# Patient Record
Sex: Female | Born: 1935 | Race: White | Hispanic: No | Marital: Married | Smoking: Former smoker
Health system: Southern US, Community
[De-identification: ages and names within clinical notes are randomized; demographics above are authoritative.]

## PROBLEM LIST (undated history)

## (undated) DIAGNOSIS — S2239XA Fracture of one rib, unspecified side, initial encounter for closed fracture: Secondary | ICD-10-CM

## (undated) DIAGNOSIS — I639 Cerebral infarction, unspecified: Secondary | ICD-10-CM

## (undated) DIAGNOSIS — E119 Type 2 diabetes mellitus without complications: Secondary | ICD-10-CM

## (undated) DIAGNOSIS — Z87442 Personal history of urinary calculi: Secondary | ICD-10-CM

## (undated) DIAGNOSIS — E785 Hyperlipidemia, unspecified: Secondary | ICD-10-CM

## (undated) DIAGNOSIS — E039 Hypothyroidism, unspecified: Secondary | ICD-10-CM

## (undated) DIAGNOSIS — J939 Pneumothorax, unspecified: Secondary | ICD-10-CM

## (undated) DIAGNOSIS — H269 Unspecified cataract: Secondary | ICD-10-CM

## (undated) DIAGNOSIS — I10 Essential (primary) hypertension: Secondary | ICD-10-CM

## (undated) DIAGNOSIS — R011 Cardiac murmur, unspecified: Secondary | ICD-10-CM

## (undated) DIAGNOSIS — M810 Age-related osteoporosis without current pathological fracture: Secondary | ICD-10-CM

## (undated) DIAGNOSIS — M1990 Unspecified osteoarthritis, unspecified site: Secondary | ICD-10-CM

## (undated) HISTORY — PX: TUBAL LIGATION: SHX77

## (undated) HISTORY — DX: Age-related osteoporosis without current pathological fracture: M81.0

## (undated) HISTORY — DX: Unspecified cataract: H26.9

## (undated) HISTORY — PX: FRACTURE SURGERY: SHX138

## (undated) HISTORY — DX: Cardiac murmur, unspecified: R01.1

## (undated) HISTORY — PX: JOINT REPLACEMENT: SHX530

## (undated) HISTORY — PX: TONSILLECTOMY: SUR1361

## (undated) HISTORY — PX: COSMETIC SURGERY: SHX468

## (undated) HISTORY — PX: EYE SURGERY: SHX253

## (undated) HISTORY — PX: AUGMENTATION MAMMAPLASTY: SUR837

## (undated) HISTORY — PX: DILATION AND CURETTAGE OF UTERUS: SHX78

---
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
## 2000-12-31 ENCOUNTER — Ambulatory Visit (HOSPITAL_COMMUNITY): Admission: RE | Admit: 2000-12-31 | Discharge: 2000-12-31 | Payer: Self-pay | Admitting: Pulmonary Disease

## 2001-02-05 ENCOUNTER — Other Ambulatory Visit: Admission: RE | Admit: 2001-02-05 | Discharge: 2001-02-05 | Payer: Self-pay | Admitting: Obstetrics and Gynecology

## 2001-04-20 ENCOUNTER — Other Ambulatory Visit: Admission: RE | Admit: 2001-04-20 | Discharge: 2001-04-20 | Payer: Self-pay | Admitting: General Surgery

## 2001-05-04 ENCOUNTER — Other Ambulatory Visit: Admission: RE | Admit: 2001-05-04 | Discharge: 2001-05-04 | Payer: Self-pay | Admitting: General Surgery

## 2002-03-11 ENCOUNTER — Other Ambulatory Visit: Admission: RE | Admit: 2002-03-11 | Discharge: 2002-03-11 | Payer: Self-pay | Admitting: Obstetrics and Gynecology

## 2003-11-24 ENCOUNTER — Ambulatory Visit (HOSPITAL_COMMUNITY): Admission: RE | Admit: 2003-11-24 | Discharge: 2003-11-24 | Payer: Self-pay | Admitting: Pulmonary Disease

## 2005-02-17 ENCOUNTER — Ambulatory Visit (HOSPITAL_COMMUNITY): Admission: RE | Admit: 2005-02-17 | Discharge: 2005-02-17 | Payer: Self-pay | Admitting: Ophthalmology

## 2005-02-24 ENCOUNTER — Ambulatory Visit (HOSPITAL_COMMUNITY): Admission: RE | Admit: 2005-02-24 | Discharge: 2005-02-24 | Payer: Self-pay | Admitting: Ophthalmology

## 2006-06-19 ENCOUNTER — Ambulatory Visit (HOSPITAL_COMMUNITY): Admission: RE | Admit: 2006-06-19 | Discharge: 2006-06-19 | Payer: Self-pay | Admitting: Obstetrics & Gynecology

## 2007-07-08 ENCOUNTER — Ambulatory Visit (HOSPITAL_COMMUNITY): Admission: RE | Admit: 2007-07-08 | Discharge: 2007-07-08 | Payer: Self-pay | Admitting: Pulmonary Disease

## 2007-08-09 ENCOUNTER — Ambulatory Visit (HOSPITAL_COMMUNITY): Admission: RE | Admit: 2007-08-09 | Discharge: 2007-08-09 | Payer: Self-pay | Admitting: Urology

## 2009-09-06 ENCOUNTER — Encounter: Payer: Self-pay | Admitting: Orthopedic Surgery

## 2009-09-06 ENCOUNTER — Ambulatory Visit (HOSPITAL_COMMUNITY): Admission: RE | Admit: 2009-09-06 | Discharge: 2009-09-06 | Payer: Self-pay | Admitting: Pulmonary Disease

## 2009-09-10 ENCOUNTER — Ambulatory Visit: Payer: Self-pay | Admitting: Orthopedic Surgery

## 2009-09-10 DIAGNOSIS — S92919A Unspecified fracture of unspecified toe(s), initial encounter for closed fracture: Secondary | ICD-10-CM | POA: Insufficient documentation

## 2009-10-01 ENCOUNTER — Ambulatory Visit (HOSPITAL_COMMUNITY): Admission: RE | Admit: 2009-10-01 | Discharge: 2009-10-01 | Payer: Self-pay | Admitting: Pulmonary Disease

## 2009-10-04 ENCOUNTER — Ambulatory Visit: Payer: Self-pay | Admitting: Orthopedic Surgery

## 2010-06-09 ENCOUNTER — Encounter: Payer: Self-pay | Admitting: Pulmonary Disease

## 2010-06-09 ENCOUNTER — Encounter: Payer: Self-pay | Admitting: Obstetrics & Gynecology

## 2010-06-18 NOTE — Letter (Signed)
Summary: *Orthopedic Consult Note  [facility]  [address]  [place], [place] [address]   Phone: [phone]  Fax: [phone]    Re:    KIANNI LHEUREUX DOB:    1931-09-07   Dear: Renae Fickle    Thank you for requesting that we see the above patient for consultation.  A copy of the detailed office note will be sent under separate cover, for your review.  Evaluation today is consistent with: non displaced fracture right great toe    Our recommendation is for: post op hard sole shoe x 3 weeks then xrays in our office        Thank you for this opportunity to look after your patient.  Sincerely,   Terrance Mass. MD.

## 2010-06-18 NOTE — Letter (Signed)
Summary: History form  History form   Imported By: Jacklynn Ganong 09/11/2009 11:39:21  _____________________________________________________________________  External Attachment:    Type:   Image     Comment:   External Document

## 2010-06-18 NOTE — Assessment & Plan Note (Signed)
Summary: CONSULT/TREAT/FX TOE/XR [facility] 09/06/09/REF [facility]/SEC HORIZ/CAF   Vital Signs:  Patient profile:   77 year old female Height:      65 inches Weight:      114 pounds Pulse rate:   66 / minute Resp:     16 per minute  Vitals Entered By: Fuller Canada MD (September 10, 2009 11:18 AM)  Visit Type:  Initial Consult Referring Provider:  Dr. Juanetta Gosling Primary Provider:  Dr. Juanetta Gosling  CC:  right big toe fracture.  History of Present Illness: 77 years old, pain, RIGHT great toe.  09/03/09 DOI.  pain level is 4.  Quality, dull burning.  Mechanism secondary to injury.  A [place] hit her  toe [place]  Other signs and symptoms, bruising and swelling  Xrays [facility] right foot 09/06/09.  Meds: Lantus Insulin, Symlin, Humalog, Synthroid, ASA, MV, Vitamin D, Ocuvite, Tylenol and Motrin as needed.    Allergies (verified): No Known Drug Allergies  Past History:  Past Medical History: thyroid diabetes  Past Surgical History: none  Family History: na  Social History: Patient is married.  retired no smoking no alcohol use caffeine daily regular  Review of Systems Constitutional:  Denies weight loss, weight gain, fever, chills, and fatigue. Cardiovascular:  Denies chest pain, palpitations, fainting, and murmurs. Respiratory:  Denies short of breath, wheezing, couch, tightness, pain on inspiration, and snoring . Gastrointestinal:  Denies heartburn, nausea, vomiting, diarrhea, constipation, and blood in your stools. Genitourinary:  Denies frequency, urgency, difficulty urinating, painful urination, flank pain, and bleeding in urine. Neurologic:  Denies numbness, tingling, unsteady gait, dizziness, tremors, and seizure. Musculoskeletal:  Denies joint pain, swelling, instability, stiffness, redness, heat, and muscle pain. Endocrine:  Denies excessive thirst, exessive urination, and heat or cold intolerance. Psychiatric:  Denies nervousness, depression, anxiety,  and hallucinations. Skin:  Denies changes in the skin, poor healing, rash, itching, and redness. HEENT:  Denies blurred or double vision, eye pain, redness, and watering. Immunology:  Denies seasonal allergies, sinus problems, and allergic to bee stings. Hemoatologic:  Denies easy bleeding and brusing.  Physical Exam  Additional Exam:  VS reviewed are stable  GENERAL: Appearance is normal   CDV: normal pulse and temperature   Skin: was normal   Neuro: sensation was normal   MSK Gait is abnormal ambulates with mild limp and post op shoe  * Inspection tenderness and swelling of the right great  toe  * ROM normal with mild pain  *MOTOR: no atrophy  *Stability no joint laxity     Impression & Recommendations:  Problem # 1:  FRACTURE, TOE, RIGHT (ICD-826.0) Assessment New  xrays  The x-rays were done at [facility]. The report and the films have been reviewed. non displaced frature right great toe   psot op shoe x  4 weeks total [so 3 more weeks]  xrays in 3 weeks   Orders: Consultation Level II (16109) Great Toe Fx (60454)  Patient Instructions: 1)  3 more weeks in shoe 2)  come back in 3 weeks for xrays rt great toe 3)  No walking for exercise

## 2010-06-18 NOTE — Assessment & Plan Note (Signed)
Summary: 3 WK RE-CK/XRAY TOE/SEC HORIZ/CAF   Visit Type:  Follow-up Referring Provider:  Dr. Juanetta Gosling Primary Provider:  Dr. Juanetta Gosling  CC:  Right big toe fracture.  History of Present Illness: I saw Danielle [name] in the office today for a followup visit.  She is a 77 years old woman with the complaint of:  right big toe fracture.  09/03/09 DOI.  Treated with post op shoe   X-rays show an impacted fracture of the great toe at the proximal phalanx, nondisplaced looks healed.  Patient can resume normal, activities. He's not complaining of any pain at this time. Clinical exam was normal  Allergies: No Known Drug Allergies   Impression & Recommendations:  Problem # 1:  FRACTURE, TOE, RIGHT (ICD-826.0) Assessment Improved  Orders: Post-Op Check (16109) Toe(s) x-ray, minimum 2 views (60454)  Patient Instructions: 1)  resume normal activity  2)  Please schedule a follow-up appointment as needed.

## 2011-03-25 ENCOUNTER — Other Ambulatory Visit (HOSPITAL_COMMUNITY): Payer: Self-pay | Admitting: Pulmonary Disease

## 2011-03-25 DIAGNOSIS — Z139 Encounter for screening, unspecified: Secondary | ICD-10-CM

## 2011-04-03 ENCOUNTER — Ambulatory Visit (HOSPITAL_COMMUNITY): Payer: Self-pay

## 2011-04-17 ENCOUNTER — Ambulatory Visit (HOSPITAL_COMMUNITY)
Admission: RE | Admit: 2011-04-17 | Discharge: 2011-04-17 | Disposition: A | Discharge status: Home / Self Care | Payer: Medicare Other | Source: Ambulatory Visit | Attending: Pulmonary Disease | Admitting: Pulmonary Disease

## 2011-04-17 DIAGNOSIS — Z1231 Encounter for screening mammogram for malignant neoplasm of breast: Secondary | ICD-10-CM | POA: Insufficient documentation

## 2011-04-17 DIAGNOSIS — Z139 Encounter for screening, unspecified: Secondary | ICD-10-CM

## 2012-09-13 ENCOUNTER — Ambulatory Visit (HOSPITAL_COMMUNITY)
Admission: RE | Admit: 2012-09-13 | Discharge: 2012-09-13 | Disposition: A | Discharge status: Home / Self Care | Payer: Medicare Other | Source: Ambulatory Visit | Attending: Pulmonary Disease | Admitting: Pulmonary Disease

## 2012-09-13 ENCOUNTER — Other Ambulatory Visit (HOSPITAL_COMMUNITY): Payer: Self-pay | Admitting: Pulmonary Disease

## 2012-09-13 DIAGNOSIS — R05 Cough: Secondary | ICD-10-CM | POA: Insufficient documentation

## 2012-09-13 DIAGNOSIS — R509 Fever, unspecified: Secondary | ICD-10-CM | POA: Insufficient documentation

## 2012-09-13 DIAGNOSIS — R059 Cough, unspecified: Secondary | ICD-10-CM | POA: Insufficient documentation

## 2012-11-27 ENCOUNTER — Emergency Department (HOSPITAL_COMMUNITY)
Admission: EM | Admit: 2012-11-27 | Discharge: 2012-11-27 | Disposition: A | Discharge status: Home / Self Care | Payer: Medicare Other | Attending: Emergency Medicine | Admitting: Emergency Medicine

## 2012-11-27 ENCOUNTER — Encounter (HOSPITAL_COMMUNITY): Payer: Self-pay

## 2012-11-27 ENCOUNTER — Emergency Department (HOSPITAL_COMMUNITY): Payer: Medicare Other

## 2012-11-27 DIAGNOSIS — R55 Syncope and collapse: Secondary | ICD-10-CM | POA: Insufficient documentation

## 2012-11-27 DIAGNOSIS — Y929 Unspecified place or not applicable: Secondary | ICD-10-CM | POA: Insufficient documentation

## 2012-11-27 DIAGNOSIS — S42213A Unspecified displaced fracture of surgical neck of unspecified humerus, initial encounter for closed fracture: Secondary | ICD-10-CM | POA: Insufficient documentation

## 2012-11-27 DIAGNOSIS — Y9301 Activity, walking, marching and hiking: Secondary | ICD-10-CM | POA: Insufficient documentation

## 2012-11-27 DIAGNOSIS — Z794 Long term (current) use of insulin: Secondary | ICD-10-CM | POA: Insufficient documentation

## 2012-11-27 DIAGNOSIS — S42212A Unspecified displaced fracture of surgical neck of left humerus, initial encounter for closed fracture: Secondary | ICD-10-CM

## 2012-11-27 DIAGNOSIS — F29 Unspecified psychosis not due to a substance or known physiological condition: Secondary | ICD-10-CM | POA: Insufficient documentation

## 2012-11-27 DIAGNOSIS — R9431 Abnormal electrocardiogram [ECG] [EKG]: Secondary | ICD-10-CM

## 2012-11-27 DIAGNOSIS — E162 Hypoglycemia, unspecified: Secondary | ICD-10-CM

## 2012-11-27 DIAGNOSIS — E1169 Type 2 diabetes mellitus with other specified complication: Secondary | ICD-10-CM | POA: Insufficient documentation

## 2012-11-27 DIAGNOSIS — Z87891 Personal history of nicotine dependence: Secondary | ICD-10-CM | POA: Insufficient documentation

## 2012-11-27 DIAGNOSIS — R296 Repeated falls: Secondary | ICD-10-CM | POA: Insufficient documentation

## 2012-11-27 DIAGNOSIS — R011 Cardiac murmur, unspecified: Secondary | ICD-10-CM | POA: Insufficient documentation

## 2012-11-27 LAB — CBC
HCT: 42.9 % (ref 36.0–46.0)
MCH: 31.9 pg (ref 26.0–34.0)
Platelets: 245 10*3/uL (ref 150–400)
RDW: 14.2 % (ref 11.5–15.5)
WBC: 14.3 10*3/uL — ABNORMAL HIGH (ref 4.0–10.5)

## 2012-11-27 LAB — BASIC METABOLIC PANEL
GFR calc Af Amer: 67 mL/min — ABNORMAL LOW (ref >90)
GFR calc non Af Amer: 58 mL/min — ABNORMAL LOW (ref >90)
Potassium: 4.2 mEq/L (ref 3.5–5.1)
Sodium: 138 mEq/L (ref 135–145)

## 2012-11-27 MED ORDER — HYDROCODONE-ACETAMINOPHEN 5-325 MG PO TABS: 1.0000 | ORAL_TABLET | ORAL | Status: DC | PRN

## 2012-11-27 MED ORDER — ACETAMINOPHEN 325 MG PO TABS
650.0000 mg | ORAL_TABLET | Freq: Once | ORAL | Status: AC
  Administered 2012-11-27: 650 mg via ORAL
  Filled 2012-11-27: qty 2

## 2012-11-27 MED ORDER — HYDROCODONE-ACETAMINOPHEN 5-325 MG PO TABS: 1.0000 | ORAL_TABLET | Freq: Once | ORAL | Status: DC

## 2012-11-27 NOTE — ED Provider Notes (Signed)
History    CSN: [id number] Arrival date & time 11/27/12  1478  First MD Initiated Contact with Patient 11/27/12 0840     Chief Complaint  Patient presents with  . Shoulder Pain  . Hypoglycemia   (Consider location/radiation/quality/duration/timing/severity/associated sxs/prior Treatment) HPI Comments: 79 yo female with DM II on insulin presents with left shoulder pain after hypoglycemia event at home PTA.  Pt was walking and she became more confused/ shaky then fell/ passed out briefly.  Husband gave her a cookie and coke and then sugar was 66.  Pt has had similar events recently, pcp decreased insulin.  No cp, sob, head ache or head injury.  No known heart issues.  Pt eats multiple small sugary meals during the day.  Lantus and insulin use. Pt quickly returned to baseline after food.  Patient is a 79 y.o. female presenting with shoulder pain and hypoglycemia. The history is provided by the patient.  Shoulder Pain This is a recurrent problem. Pertinent negatives include no chest pain, no abdominal pain, no headaches and no shortness of breath.  Hypoglycemia Associated symptoms: no shortness of breath and no vomiting    Past Medical History  Diagnosis Date  . Diabetes mellitus without complication    No past surgical history on file. No family history on file. History  Substance Use Topics  . Smoking status: Not on file  . Smokeless tobacco: Not on file  . Alcohol Use: Not on file   OB History   Grav Para Term Preterm Abortions TAB SAB Ect Mult Living                 Review of Systems  Constitutional: Negative for fever and chills.  HENT: Negative for neck pain.   Eyes: Negative for visual disturbance.  Respiratory: Negative for shortness of breath.   Cardiovascular: Negative for chest pain.  Gastrointestinal: Negative for vomiting and abdominal pain.  Genitourinary: Negative for dysuria.  Skin: Negative for rash.  Neurological: Positive for syncope. Negative for  light-headedness and headaches.  Psychiatric/Behavioral: Positive for confusion.    Allergies  Review of patient's allergies indicates not on file.  Home Medications  No current outpatient prescriptions on file. There were no vitals taken for this visit. Physical Exam  Nursing note and vitals reviewed. Constitutional: She is oriented to person, place, and time. She appears well-developed and well-nourished.  HENT:  Head: Normocephalic and atraumatic.  Eyes: Conjunctivae and EOM are normal. Right eye exhibits no discharge. Left eye exhibits no discharge.  Neck: Normal range of motion. Neck supple. No tracheal deviation present.  Cardiovascular: Normal rate and regular rhythm.   Murmur (2+ LSB upper SM ) heard. Pulmonary/Chest: Effort normal and breath sounds normal.  Abdominal: Soft. There is no tenderness.  Musculoskeletal: She exhibits tenderness. She exhibits no edema.  Tender prox humerus and anterior shoulder with palpation, flexion and abd, nv intact distal, no elbow or wrist pain, no clavicle or neck pain  Neurological: She is alert and oriented to person, place, and time. No cranial nerve deficit.  Skin: Skin is warm. No rash noted.  Psychiatric: She has a normal mood and affect.    ED Course  Procedures (including critical care time) SPLINT APPLICATION Authorized by: Enid Skeens Consent: Verbal consent obtained. Risks and benefits: risks, benefits and alternatives were discussed Consent given by: patient Splint applied by: myself Location details: left upper arm Splint type: orthoglass/ Supplies used: water.  Sling applied Post-procedure: The splinted body part was neurovascularly  unchanged following the procedure. Patient tolerance: Patient tolerated the procedure well with no immediate complications.  Labs Reviewed  GLUCOSE, CAPILLARY - Abnormal; Notable for the following:    Glucose-Capillary 201 (*)    All other components within normal limits  CBC -  Abnormal; Notable for the following:    WBC 14.3 (*)    All other components within normal limits  BASIC METABOLIC PANEL - Abnormal; Notable for the following:    Glucose, Bld 220 (*)    GFR calc non Af Amer 58 (*)    GFR calc Af Amer 67 (*)    All other components within normal limits  TROPONIN I   No results found. No diagnosis found.  MDM  Recurrent issue.  EKG reviewed  Date: 11/27/2012  Rate: 84  Rhythm: normal sinus rhythm  QRS Axis: left  Intervals: QT prolonged  ST/T Wave abnormalities: nonspecific ST changes  Conduction Disutrbances:left bundle branch block  Narrative Interpretation:   Old EKG Reviewed: none available   With questionable syncope cardiac evaluation done.  EKG abnormal however no olds.  Discussed cardiac eval with patient, she prefers to see her physician, this is identical to previous low glu episodes, recommended outpt echo/ and or stress through pcp. Proximal left humeral neck fracture, xray reviewed.     Enid Skeens, MD 11/27/12 1032

## 2012-11-27 NOTE — ED Notes (Signed)
Pt reports her blood sugar dropped this morning and she passed out and fell onto hardwood floor.  Husband put her some coke in her mouth and when pt became more responsive, her husband gave her a cookie.  After the coke and cookie cbg was 66.  Pt drank more coke and sugar came up to 166.  Pt went to [employer] and ate breakfast prior to coming to ED.  C/O left shoulder pain from fall.  Denies any other symptoms.  Pt says she has hypoglycemic episodes frequently.

## 2012-11-29 ENCOUNTER — Ambulatory Visit (INDEPENDENT_AMBULATORY_CARE_PROVIDER_SITE_OTHER): Payer: Medicare Other | Admitting: Orthopedic Surgery

## 2012-11-29 ENCOUNTER — Encounter: Payer: Self-pay | Admitting: Orthopedic Surgery

## 2012-11-29 VITALS — BP 160/80 | Ht 64.0 in | Wt 110.0 lb

## 2012-11-29 DIAGNOSIS — S42202A Unspecified fracture of upper end of left humerus, initial encounter for closed fracture: Secondary | ICD-10-CM

## 2012-11-29 DIAGNOSIS — S42209A Unspecified fracture of upper end of unspecified humerus, initial encounter for closed fracture: Secondary | ICD-10-CM

## 2012-11-29 HISTORY — DX: Unspecified fracture of upper end of unspecified humerus, initial encounter for closed fracture: S42.209A

## 2012-11-29 NOTE — Progress Notes (Signed)
  Subjective:    Danielle [name] is a 79 y.o. female who presents with traumatic onset of left shoulder pain after her sugar dropped on Saturday, July 12. She fell her husband took her to the emergency room where x-ray showed a proximal humerus fracture she was placed in a sling-and-swathe. She complains of sharp dull 7/10 intermittent pain which is worse with coughing or bending over. She has some swelling in her hand and neck Moses in her left breast  She denies numbness or tingling or locking.  She has a history of heart murmurs for a long time she bruises easily her other 12 systems were reviewed and were normal The following portions of the patient's history were reviewed and updated as appropriate: allergies, current medications, past family history, past medical history, past social history, past surgical history and problem list.  Review of Systems Pertinent items are noted in HPI.   Objective:    BP 160/80  Ht 5\' 4"  (1.626 m)  Wt 110 lb (49.896 kg)  BMI 18.87 kg/m2 General appearance is normal, the patient is alert and oriented x3 with normal mood and affect. She ambulates well despite being in a sling-and-swathe for which we readjusted. On inspection she has a lot of bruising and swelling proximally in the chest and proximal humerus area with tenderness to palpation. Elbow hand and wrist motion are normal shoulder motion is limited by pain. Elbow wrist and hand stability normal left shoulder stability on x-ray confirmed muscle tone normal grip strength weak presumed secondary to pain no atrophy. Skin ecchymotic but intact without laceration. Radial and ulnar pulses are normal temperature of the extremity is warm to touch and there are no sensory deficits Assessment:    Left Proximal humerus fracture. The x-rayindicates a nondisplaced Neer criteria nonoperative treatment proximal humerus fracture.     Plan:    Sling-and-swathe return to a half weeks x-ray left shoulder fracture  care

## 2012-11-29 NOTE — Patient Instructions (Addendum)
WEAR SLING

## 2012-11-30 ENCOUNTER — Telehealth: Payer: Self-pay | Admitting: Orthopedic Surgery

## 2012-11-30 NOTE — Telephone Encounter (Signed)
Danielle [name] has a hair appointment coming up this week, and will have to lay back in the chair to get her hair washed.  She asked if the reclining will hurt her shoulder.  Her # [phone]

## 2012-11-30 NOTE — Telephone Encounter (Signed)
i have no idea

## 2012-11-30 NOTE — Telephone Encounter (Signed)
Advised the patient of doctor's reply °

## 2012-12-14 ENCOUNTER — Ambulatory Visit (INDEPENDENT_AMBULATORY_CARE_PROVIDER_SITE_OTHER): Payer: Medicare Other

## 2012-12-14 ENCOUNTER — Encounter: Payer: Self-pay | Admitting: Orthopedic Surgery

## 2012-12-14 ENCOUNTER — Ambulatory Visit (INDEPENDENT_AMBULATORY_CARE_PROVIDER_SITE_OTHER): Payer: Medicare Other | Admitting: Orthopedic Surgery

## 2012-12-14 VITALS — BP 180/90 | Ht 64.0 in | Wt 110.0 lb

## 2012-12-14 DIAGNOSIS — S4292XD Fracture of left shoulder girdle, part unspecified, subsequent encounter for fracture with routine healing: Secondary | ICD-10-CM

## 2012-12-14 DIAGNOSIS — IMO0001 Reserved for inherently not codable concepts without codable children: Secondary | ICD-10-CM

## 2012-12-14 DIAGNOSIS — S4290XA Fracture of unspecified shoulder girdle, part unspecified, initial encounter for closed fracture: Secondary | ICD-10-CM

## 2012-12-14 HISTORY — DX: Fracture of unspecified shoulder girdle, part unspecified, initial encounter for closed fracture: S42.90XA

## 2012-12-14 NOTE — Patient Instructions (Signed)
Start OT    

## 2012-12-14 NOTE — Progress Notes (Signed)
Patient ID: Danielle [name], female   DOB: Mar 16, 1932, 79 y.o.   MRN: [id number] Chief Complaint  Patient presents with  . Follow-up    2 week recheck left shoulder fracture DOI 11/27/12   BP 180/90  Ht 5\' 4"  (1.626 m)  Wt 110 lb (49.896 kg)  BMI 18.87 kg/m2  left shoulder proximal humerus fracture today's x-rays show stable fracture greater tuberosity is in good position  Remaining fracture lines looked good  Recommend start therapy return 6 weeks

## 2012-12-17 ENCOUNTER — Ambulatory Visit (HOSPITAL_COMMUNITY)
Admission: RE | Admit: 2012-12-17 | Discharge: 2012-12-17 | Disposition: A | Discharge status: Home / Self Care | Payer: Medicare Other | Source: Ambulatory Visit | Attending: Orthopedic Surgery | Admitting: Orthopedic Surgery

## 2012-12-17 DIAGNOSIS — M6281 Muscle weakness (generalized): Secondary | ICD-10-CM

## 2012-12-17 DIAGNOSIS — M25519 Pain in unspecified shoulder: Secondary | ICD-10-CM

## 2012-12-17 DIAGNOSIS — IMO0001 Reserved for inherently not codable concepts without codable children: Secondary | ICD-10-CM | POA: Insufficient documentation

## 2012-12-17 HISTORY — DX: Pain in unspecified shoulder: M25.519

## 2012-12-17 HISTORY — DX: Muscle weakness (generalized): M62.81

## 2012-12-17 NOTE — Evaluation (Signed)
Occupational Therapy Evaluation  Patient Details  Name: Danielle [name] MRN: [id number] Date of Birth: 21-Sep-1931  Today's Date: 12/17/2012 Time: 1018-1100 OT Time Calculation (min): 42 min OT eval 4098-1191 10' MFR 1028-1100 32'  Visit#: 1 of 16  Re-eval: 01/14/13  Assessment Diagnosis: Left proximal humerus fx Next MD Visit: 01/25/13 - Danielle [name]  Authorization: [employer] medicare  Authorization Time Period: before 10th visit  Authorization Visit#: 1 of 10   Past Medical History:  Past Medical History  Diagnosis Date  . Diabetes mellitus without complication    Past Surgical History:  Past Surgical History  Procedure Laterality Date  . Breast enhancement surgery    . Tubal ligation      Subjective Symptoms/Limitations Pertinent History: On November 27, 2012, Danielle [name]'s sugar dropped causing her to fall on her left shoulder causing a left proximal humerus fx. No surgery was done. Patient has been wearing a sling 24/7. Dr. Romeo [name] has referred patient to occupational therapy for evaluation and treatment.  Patient Stated Goals: To be able to drive and use her arm normally.  Pain Assessment Currently in Pain?: No/denies  Precautions/Restrictions  Precautions Precautions: Shoulder Type of Shoulder Precautions: Follow protocol. week 3-4 (8/11-8/22) Sling may be d/c'd. AROM  to shoulder, elbow, and forearm.  Week 4-6 (8/22-8/29) AAROM to shoulder, elbow, and forearm. Full PROM by week 6. Week 8+ progress to strengthening.  Balance Screening Balance Screen Has the patient fallen in the past 6 months: Yes How many times?: 1 Has the patient had a decrease in activity level because of a fear of falling? : No Is the patient reluctant to leave their home because of a fear of falling? : No  Prior Functioning  Home Living Family/patient expects to be discharged to:: Private residence Living Arrangements: Spouse/significant other Prior Function Level of Independence: Independent with  basic ADLs;Independent with gait  Able to Take Stairs?: Yes Driving: Yes Vocation: Retired Leisure: Hobbies-yes (Comment) Comments: Patient enjoys walking, being outdoors, going out to eat.  Assessment ADL/Vision/Perception ADL ADL Comments: Difficulty pulling pants, opening  containers, putting shirts on and off.  Dominant Hand: Right Vision - History Baseline Vision: No visual deficits  Cognition/Observation Cognition Overall Cognitive Status: Within Functional Limits for tasks assessed Arousal/Alertness: Awake/alert Orientation Level: Oriented X4  Sensation/Coordination/Edema Edema Edema: Increased edema from elbow region up to shoulder region.  Additional Assessments LUE Assessment LUE Assessment:  (assessed supine. IR/ER Adducted) LUE PROM (degrees) Left Shoulder Flexion: 120 Degrees Left Shoulder ABduction: 70 Degrees Left Shoulder Internal Rotation: 90 Degrees Left Shoulder External Rotation: 35 Degrees Left Elbow Flexion: 55 Left Elbow Extension:  (20 degrees from 0) LUE Strength LUE Overall Strength Comments: Unable to assess at this time. Palpation Palpation: Max fascial restrictions in elbow, upper arm, trapezius, and scapularis region.      Exercise/Treatments     Manual Therapy Manual Therapy: Myofascial release Edema Management: Edema massage to left elbow to upper arm region to increase joint mobility and comfort. Education provided to elevate arm when seating in chair at home.  Myofascial Release: MFR and manual stretching to left elbow, upper arm, trapezius and scapularis region to increase joint mobility in a pain free zone.   Occupational Therapy Assessment and Plan OT Assessment and Plan Clinical Impression Statement: A:  Patient presents with decreased use of LUE with daily activities s/p left proximal humerus fracture.  Deficits include decreased range of motion, strength, functional use, and increased pain, edema, and fascial restrictions.     Pt will benefit  from skilled therapeutic intervention in order to improve on the following deficits: Increased edema;Impaired UE functional use;Increased fascial restricitons;Decreased range of motion;Pain;Decreased strength Rehab Potential: Excellent OT Frequency: Min 2X/week OT Duration: 8 weeks OT Treatment/Interventions: Therapeutic activities;Self-care/ADL training;Therapeutic exercise;Manual therapy;Modalities;Patient/family education OT Plan: P:  Skilled OT intervention to decrease pain and fascial restrictions and increase pain free mobilty, strength, and functional use of LUE with all B/IADLs  and leisure activities.  Treatment Plan:  Follow protocol. MFR and manual stretching. PROM supine, elevation, row, extension, therapy ball stretches, pro/ret/elev/dep.       Goals Short Term Goals Time to Complete Short Term Goals: 4 weeks Short Term Goal 1: Patient will be educated on HEP. Short Term Goal 2: Patient will increase PROM to [facility] to decrease difficulty with donning and doffing shirts. Short Term Goal 3: Patient will increase shoulder strength to 3+/5 to be able to pull pants up over hips.  Short Term Goal 4: Patient will decrease pain level to 4/10 when completing daily activities.  Short Term Goal 5: Patient will decrease fascial restrictions in left arm from max to mod to increase ability to reach up overhead.  Long Term Goals Time to Complete Long Term Goals: 8 weeks Long Term Goal 1: Patient will return to highest level of independence with all daily and leisure activities.  Long Term Goal 2: Patient will increase AROM WNL to increase ability to complete daily activities. Long Term Goal 3: Patient will increase shoulder strength to 4/5 to be able to turn steering wheel when driving. Long Term Goal 4: Patient will decrease pain level to 2/10 or less when completing daily activities.  Long Term Goal 5: Patient will decrease fascial restrictions in left arm from mod to min to  increase ability to reach up overhead.  Problem List Patient Active Problem List   Diagnosis Date Noted  . Muscle weakness (generalized) 12/17/2012  . Pain in joint, shoulder region 12/17/2012  . Shoulder fracture 12/14/2012  . Proximal humerus fracture 11/29/2012  . FRACTURE, TOE, RIGHT 09/10/2009    End of Session Activity Tolerance: Patient tolerated treatment well General Behavior During Therapy: [facility] for tasks assessed/performed OT Plan of Care OT Home Exercise Plan: towel slides OT Patient Instructions: handout - scanned Consulted and Agree with Plan of Care: Patient;Family member/caregiver Family Member Consulted: husband  GO Functional Assessment Tool Used: DASH score 67.5 with ideal score of 0. Functional Limitation: Carrying, moving and handling objects Carrying, Moving and Handling Objects Current Status [phone]): At least 60 percent but less than 80 percent impaired, limited or restricted Carrying, Moving and Handling Objects Goal Status [phone]): At least 1 percent but less than 20 percent impaired, limited or restricted  Limmie Patricia, OTR/L,CBIS   12/17/2012, 1:49 PM  Physician Documentation Your signature is required to indicate approval of the treatment plan as stated above.  Please sign and either send electronically or make a copy of this report for your files and return this physician signed original.  Please mark one 1.__approve of plan  2. ___approve of plan with the following conditions.   ______________________________                                                          _____________________ Physician Signature  Date  

## 2012-12-22 ENCOUNTER — Ambulatory Visit (HOSPITAL_COMMUNITY)
Admission: RE | Admit: 2012-12-22 | Discharge: 2012-12-22 | Disposition: A | Discharge status: Home / Self Care | Payer: Medicare Other | Source: Ambulatory Visit | Attending: Orthopedic Surgery | Admitting: Orthopedic Surgery

## 2012-12-22 NOTE — Progress Notes (Signed)
Occupational Therapy Treatment Patient Details  Name: Danielle [name] MRN: [id number] Date of Birth: 09/27/1931  Today's Date: 12/22/2012 Time: 6578-4696 OT Time Calculation (min): 40 min MFR 2952-8413 12' Therex 2440-1027 28'  Visit#: 2 of 16  Re-eval: 01/14/13    Authorization: [employer] medicare  Authorization Time Period: before 10th visit  Authorization Visit#: 2 of 10  Subjective Pain Assessment Currently in Pain?: Yes Pain Score: 6  Pain Location: Shoulder Pain Orientation: Left Pain Type: Acute pain  Precautions/Restrictions  Precautions Precautions: Shoulder Type of Shoulder Precautions: Follow protocol. week 3-4 (8/11-8/22) Sling may be d/c'd. AROM  to shoulder, elbow, and forearm.  Week 4-6 (8/22-8/29) AAROM to shoulder, elbow, and forearm. Full PROM by week 6. Week 8+ progress to strengthening.  Exercise/Treatments Supine Protraction: PROM;10 reps Horizontal ABduction: PROM;10 reps External Rotation: PROM;10 reps Internal Rotation: PROM;10 reps Flexion: PROM;10 reps ABduction: PROM;10 reps Other Supine Exercises: bridges X15 Seated Elevation: AROM;10 reps Extension: AROM;10 reps Row: AROM;10 reps Therapy Ball Flexion: 10 reps ABduction: 10 reps     Manual Therapy Manual Therapy: Myofascial release Myofascial Release: MFR and manual stretching to left elbow, upper arm, trapezius and scapularis region to increase joint mobility in a pain free zone.  Occupational Therapy Assessment and Plan OT Assessment and Plan Clinical Impression Statement: A: Patient completed PROM supine with WFL with flexion and Abduction. Pain felt during horizontal Abduction and IR/ER. Patient's husband was interested in performing PROM exercises with wife at home. Provided education to husband. Will try to find a print off of exercises to do at home.  OT Plan: P: Attempt thumb tacks, pro/ret/elev/dep and AAROM supine per protocol. d/c sling.    Goals Short Term Goals Time to  Complete Short Term Goals: 4 weeks Short Term Goal 1: Patient will be educated on HEP. Short Term Goal 1 Progress: Progressing toward goal Short Term Goal 2: Patient will increase PROM to [facility] to decrease difficulty with donning and doffing shirts. Short Term Goal 2 Progress: Progressing toward goal Short Term Goal 3: Patient will increase shoulder strength to 3+/5 to be able to pull pants up over hips.  Short Term Goal 3 Progress: Progressing toward goal Short Term Goal 4: Patient will decrease pain level to 4/10 when completing daily activities.  Short Term Goal 4 Progress: Progressing toward goal Short Term Goal 5: Patient will decrease fascial restrictions in left arm from max to mod to increase ability to reach up overhead.  Short Term Goal 5 Progress: Progressing toward goal Long Term Goals Time to Complete Long Term Goals: 8 weeks Long Term Goal 1: Patient will return to highest level of independence with all daily and leisure activities.  Long Term Goal 1 Progress: Progressing toward goal Long Term Goal 2: Patient will increase AROM WNL to increase ability to complete daily activities. Long Term Goal 2 Progress: Progressing toward goal Long Term Goal 3: Patient will increase shoulder strength to 4/5 to be able to turn steering wheel when driving. Long Term Goal 3 Progress: Progressing toward goal Long Term Goal 4: Patient will decrease pain level to 2/10 or less when completing daily activities.  Long Term Goal 4 Progress: Progressing toward goal Long Term Goal 5: Patient will decrease fascial restrictions in left arm from mod to min to increase ability to reach up overhead. Long Term Goal 5 Progress: Progressing toward goal  Problem List Patient Active Problem List   Diagnosis Date Noted  . Muscle weakness (generalized) 12/17/2012  . Pain in joint,  shoulder region 12/17/2012  . Shoulder fracture 12/14/2012  . Proximal humerus fracture 11/29/2012  . FRACTURE, TOE, RIGHT  09/10/2009    End of Session Activity Tolerance: Patient tolerated treatment well General Behavior During Therapy: [facility] for tasks assessed/performed OT Plan of Care OT Home Exercise Plan: seated AROM exercises OT Patient Instructions: handout - scanned Consulted and Agree with Plan of Care: Patient;Family member/caregiver Family Member Consulted: husband   Limmie Patricia, OTR/L,CBIS   12/22/2012, 11:54 AM

## 2012-12-29 ENCOUNTER — Ambulatory Visit (HOSPITAL_COMMUNITY)
Admission: RE | Admit: 2012-12-29 | Discharge: 2012-12-29 | Disposition: A | Discharge status: Home / Self Care | Payer: Medicare Other | Source: Ambulatory Visit | Attending: Pulmonary Disease | Admitting: Pulmonary Disease

## 2012-12-29 NOTE — Progress Notes (Signed)
Occupational Therapy Treatment Patient Details  Name: Danielle [name] MRN: [id number] Date of Birth: [birth year]  Today's Date: 12/29/2012 Time: 4098-1191 OT Time Calculation (min): 37 min MFR 478-295 20' Therex 621-3086 17'  Visit#: 3 of 16  Re-eval: 01/14/13    Authorization: [employer] medicare  Authorization Time Period: before 10th visit  Authorization Visit#: 3 of 10  Subjective Symptoms/Limitations Symptoms: S: We've been the stretching at home from what we can remember. At least once a day.  Pain Assessment Currently in Pain?: Yes Pain Score: 6  Pain Location: Shoulder Pain Orientation: Left Pain Type: Acute pain  Precautions/Restrictions  Precautions Precautions: Shoulder Type of Shoulder Precautions: Follow protocol. week 3-4 (8/11-8/22) Sling may be d/c'd. AAROM  to shoulder, elbow, and forearm.  Week 4-6 (8/22-8/29) AROM to shoulder, elbow, and forearm. Full PROM by week 6. Week 8+ progress to strengthening.  Exercise/Treatments Supine Protraction: PROM;10 reps Horizontal ABduction: PROM;10 reps External Rotation: PROM;10 reps Internal Rotation: PROM;10 reps Flexion: PROM;10 reps ABduction: PROM;10 reps Other Supine Exercises: bridges X15 Seated Elevation: AROM;10 reps Extension: AROM;10 reps Row: AROM;10 reps Therapy Ball Flexion: 10 reps ABduction: 10 reps ROM / Strengthening / Isometric Strengthening Thumb Tacks: 1' (several rest breaks due to fatigue) Prot/Ret//Elev/Dep: 1' (tactile cueing required) Flexion: 3X3" Extension: 3X3" External Rotation: 3X3" Internal Rotation: 3X3" ABduction: 3X3" ADduction: 3X3"         Manual Therapy Manual Therapy: Myofascial release Myofascial Release: MFR and manual stretching to left elbow, upper arm, trapezius and scapularis region to increase joint mobility in a pain free zone.  Occupational Therapy Assessment and Plan OT Assessment and Plan Clinical Impression Statement: A:  Added isometric  exercises, pro/ret/dep/elev, and thumbs tacks. Difficulty performing flexion isometric. Required tactile cues and rest breaks during wall exercises.  OT Plan: P: Attempt AAROM next week.   Goals Short Term Goals Time to Complete Short Term Goals: 4 weeks Short Term Goal 1: Patient will be educated on HEP. Short Term Goal 1 Progress: Met Short Term Goal 2: Patient will increase PROM to [facility] to decrease difficulty with donning and doffing shirts. Short Term Goal 2 Progress: Progressing toward goal Short Term Goal 3: Patient will increase shoulder strength to 3+/5 to be able to pull pants up over hips.  Short Term Goal 3 Progress: Progressing toward goal Short Term Goal 4: Patient will decrease pain level to 4/10 when completing daily activities.  Short Term Goal 4 Progress: Progressing toward goal Short Term Goal 5: Patient will decrease fascial restrictions in left arm from max to mod to increase ability to reach up overhead.  Short Term Goal 5 Progress: Progressing toward goal Long Term Goals Time to Complete Long Term Goals: 8 weeks Long Term Goal 1: Patient will return to highest level of independence with all daily and leisure activities.  Long Term Goal 1 Progress: Progressing toward goal Long Term Goal 2: Patient will increase AROM WNL to increase ability to complete daily activities. Long Term Goal 2 Progress: Progressing toward goal Long Term Goal 3: Patient will increase shoulder strength to 4/5 to be able to turn steering wheel when driving. Long Term Goal 3 Progress: Progressing toward goal Long Term Goal 4: Patient will decrease pain level to 2/10 or less when completing daily activities.  Long Term Goal 4 Progress: Progressing toward goal Long Term Goal 5: Patient will decrease fascial restrictions in left arm from mod to min to increase ability to reach up overhead. Long Term Goal 5 Progress: Progressing toward goal  Problem List Patient Active Problem List   Diagnosis Date  Noted  . Muscle weakness (generalized) 12/17/2012  . Pain in joint, shoulder region 12/17/2012  . Shoulder fracture 12/14/2012  . Proximal humerus fracture 11/29/2012  . FRACTURE, TOE, RIGHT 09/10/2009    End of Session Activity Tolerance: Patient tolerated treatment well General Behavior During Therapy: [facility] for tasks assessed/performed OT Plan of Care OT Home Exercise Plan: towel slides OT Patient Instructions: handout - scanned Consulted and Agree with Plan of Care: Patient;Family member/caregiver Family Member Consulted: husband   Limmie Patricia, OTR/L,CBIS   12/29/2012, 10:20 AM

## 2012-12-31 ENCOUNTER — Ambulatory Visit (HOSPITAL_COMMUNITY)
Admission: RE | Admit: 2012-12-31 | Discharge: 2012-12-31 | Disposition: A | Discharge status: Home / Self Care | Payer: Medicare Other | Source: Ambulatory Visit | Attending: Orthopedic Surgery | Admitting: Orthopedic Surgery

## 2012-12-31 NOTE — Progress Notes (Signed)
Occupational Therapy Treatment Patient Details  Name: Danielle [name] MRN: [id number] Date of Birth: 04-20-1932  Today's Date: 12/31/2012 Time: 0865-7846 OT Time Calculation (min): 38 min MFR 9629-5284 13' Therex 1324-4010 25'  Visit#: 4 of 16  Re-eval: 01/14/13    Authorization: [employer] medicare  Authorization Time Period: before 10th visit  Authorization Visit#: 4 of 10  Subjective Symptoms/Limitations Symptoms: S: We do the exercises at home. Sometimes he more rough. Pain Assessment Currently in Pain?: Yes Pain Score: 6  Pain Location: Shoulder Pain Orientation: Left Pain Type: Acute pain  Precautions/Restrictions  Precautions Precautions: Shoulder Type of Shoulder Precautions: Follow protocol. week 3-4 (8/11-8/22) Sling may be d/c'd. AAROM  to shoulder, elbow, and forearm.  Week 4-6 (8/22-8/29) AROM to shoulder, elbow, and forearm. Full PROM by week 6. Week 8+ progress to strengthening.  Exercise/Treatments Supine Protraction: PROM;10 reps Horizontal ABduction: PROM;10 reps External Rotation: PROM;10 reps Internal Rotation: PROM;10 reps Flexion: PROM;10 reps ABduction: PROM;10 reps Other Supine Exercises: bridges X15 Seated Elevation: AROM;12 reps Extension: AROM;12 reps Row: AROM;12 reps Therapy Ball Flexion: 15 reps ABduction: 15 reps ROM / Strengthening / Isometric Strengthening Thumb Tacks: 1' (low level) Prot/Ret//Elev/Dep: 1' (tactile cueing) Flexion: 3X3" Extension: 3X3" External Rotation: 3X3" Internal Rotation: 3X3" ABduction: 3X3" ADduction: 3X3"     Manual Therapy Manual Therapy: Myofascial release Myofascial Release: MFR and manual stretching to left elbow, upper arm, trapezius and scapularis region to increase joint mobility in a pain free zone.  Occupational Therapy Assessment and Plan OT Assessment and Plan Clinical Impression Statement: A: Patient still required tactile cues for pro/ret/elev/dep. Patient performed isometric flexion  supine with less difficulty compare to last week. Continues education provided to patient and husband on what to do and what not to do with left shoulder. Patient's husband is very supportive.  OT Plan: P: Attempt AAROM supine.   Goals Short Term Goals Time to Complete Short Term Goals: 4 weeks Short Term Goal 1: Patient will be educated on HEP. Short Term Goal 2: Patient will increase PROM to [facility] to decrease difficulty with donning and doffing shirts. Short Term Goal 3: Patient will increase shoulder strength to 3+/5 to be able to pull pants up over hips.  Short Term Goal 4: Patient will decrease pain level to 4/10 when completing daily activities.  Short Term Goal 5: Patient will decrease fascial restrictions in left arm from max to mod to increase ability to reach up overhead.  Long Term Goals Time to Complete Long Term Goals: 8 weeks Long Term Goal 1: Patient will return to highest level of independence with all daily and leisure activities.  Long Term Goal 2: Patient will increase AROM WNL to increase ability to complete daily activities. Long Term Goal 3: Patient will increase shoulder strength to 4/5 to be able to turn steering wheel when driving. Long Term Goal 4: Patient will decrease pain level to 2/10 or less when completing daily activities.  Long Term Goal 5: Patient will decrease fascial restrictions in left arm from mod to min to increase ability to reach up overhead.  Problem List Patient Active Problem List   Diagnosis Date Noted  . Muscle weakness (generalized) 12/17/2012  . Pain in joint, shoulder region 12/17/2012  . Shoulder fracture 12/14/2012  . Proximal humerus fracture 11/29/2012  . FRACTURE, TOE, RIGHT 09/10/2009    End of Session Activity Tolerance: Patient tolerated treatment well General Behavior During Therapy: [facility] for tasks assessed/performed OT Plan of Care OT Home Exercise Plan: towel slides  OT Patient Instructions: handout - scanned Consulted and  Agree with Plan of Care: Patient;Family member/caregiver Family Member Consulted: husband   Limmie Patricia, OTR/L,CBIS   12/31/2012, 11:47 AM

## 2013-01-03 ENCOUNTER — Ambulatory Visit (HOSPITAL_COMMUNITY)
Admission: RE | Admit: 2013-01-03 | Discharge: 2013-01-03 | Disposition: A | Discharge status: Home / Self Care | Payer: Medicare Other | Source: Ambulatory Visit | Attending: Orthopedic Surgery | Admitting: Orthopedic Surgery

## 2013-01-03 NOTE — Progress Notes (Signed)
Occupational Therapy Treatment Patient Details  Name: Danielle [name] MRN: [id number] Date of Birth: [birth year]  Today's Date: 01/03/2013 Time: 1015-1100 OT Time Calculation (min): 45 min MFR 4098-1191 12' Therex 1027-1100 33'  Visit#: 5 of 16  Re-eval: 01/14/13    Authorization: [employer] medicare  Authorization Time Period: before 10th visit  Authorization Visit#: 5 of 10  Subjective Symptoms/Limitations Symptoms: S: I've been doing more with my arm at home. Sometimes it feels like it wants to catch.  Pain Assessment Currently in Pain?: Yes Pain Score: 4  Pain Location: Shoulder Pain Orientation: Left Pain Type: Acute pain  Precautions/Restrictions  Precautions Precautions: Shoulder Type of Shoulder Precautions: Follow protocol. week 3-4 (8/11-8/22) Sling may be d/c'd. AAROM  to shoulder, elbow, and forearm.  Week 4-6 (8/22-8/29) AROM to shoulder, elbow, and forearm. Full PROM by week 6. Week 8+ progress to strengthening.  Exercise/Treatments Supine Protraction: PROM;AAROM;10 reps Horizontal ABduction: PROM;AAROM;10 reps External Rotation: PROM;AAROM;10 reps Internal Rotation: PROM;AAROM;10 reps Flexion: PROM;AAROM;10 reps ABduction: PROM;AAROM;10 reps Seated Elevation: AROM;12 reps Extension: AROM;12 reps Row: AROM;12 reps Prone   Therapy Ball Flexion: 15 reps ABduction: 15 reps ROM / Strengthening / Isometric Strengthening Thumb Tacks: 1' (only one short rest break) Prot/Ret//Elev/Dep: 1' (tactile cues needed)       Manual Therapy Manual Therapy: Myofascial release Myofascial Release: MFR and manual stretching to left elbow, upper arm, trapezius and scapularis region to increase joint mobility in a pain free zone  Occupational Therapy Assessment and Plan OT Assessment and Plan Clinical Impression Statement: A: Added AAROM supine and proximal shoulder strengthening. Patient tolerated well. Still required tactile cues for pro/ret/elev/dep. Added AAROM  supine to HEP. OT Plan: P: Attempt AAROM seated.   Goals Short Term Goals Time to Complete Short Term Goals: 4 weeks Short Term Goal 1: Patient will be educated on HEP. Short Term Goal 2: Patient will increase PROM to [facility] to decrease difficulty with donning and doffing shirts. Short Term Goal 2 Progress: Progressing toward goal Short Term Goal 3: Patient will increase shoulder strength to 3+/5 to be able to pull pants up over hips.  Short Term Goal 3 Progress: Progressing toward goal Short Term Goal 4: Patient will decrease pain level to 4/10 when completing daily activities.  Short Term Goal 4 Progress: Progressing toward goal Short Term Goal 5: Patient will decrease fascial restrictions in left arm from max to mod to increase ability to reach up overhead.  Short Term Goal 5 Progress: Progressing toward goal Long Term Goals Time to Complete Long Term Goals: 8 weeks Long Term Goal 1: Patient will return to highest level of independence with all daily and leisure activities.  Long Term Goal 1 Progress: Progressing toward goal Long Term Goal 2: Patient will increase AROM WNL to increase ability to complete daily activities. Long Term Goal 2 Progress: Progressing toward goal Long Term Goal 3: Patient will increase shoulder strength to 4/5 to be able to turn steering wheel when driving. Long Term Goal 3 Progress: Progressing toward goal Long Term Goal 4: Patient will decrease pain level to 2/10 or less when completing daily activities.  Long Term Goal 4 Progress: Progressing toward goal Long Term Goal 5: Patient will decrease fascial restrictions in left arm from mod to min to increase ability to reach up overhead. Long Term Goal 5 Progress: Progressing toward goal  Problem List Patient Active Problem List   Diagnosis Date Noted  . Muscle weakness (generalized) 12/17/2012  . Pain in joint, shoulder region 12/17/2012  .  Shoulder fracture 12/14/2012  . Proximal humerus fracture 11/29/2012   . FRACTURE, TOE, RIGHT 09/10/2009    End of Session Activity Tolerance: Patient tolerated treatment well General Behavior During Therapy: [facility] for tasks assessed/performed OT Plan of Care OT Home Exercise Plan: Dowel rod exercises OT Patient Instructions: handout - scanned Consulted and Agree with Plan of Care: Patient;Family member/caregiver Family Member Consulted: husband   Limmie Patricia, OTR/L,CBIS   01/03/2013, 11:00 AM

## 2013-01-05 ENCOUNTER — Ambulatory Visit (HOSPITAL_COMMUNITY)
Admission: RE | Admit: 2013-01-05 | Discharge: 2013-01-05 | Disposition: A | Discharge status: Home / Self Care | Payer: Medicare Other | Source: Ambulatory Visit | Attending: Pulmonary Disease | Admitting: Pulmonary Disease

## 2013-01-05 NOTE — Progress Notes (Signed)
Occupational Therapy Treatment Patient Details  Name: Danielle [name] MRN: [id number] Date of Birth: [birth year]  Today's Date: 01/05/2013 Time: 4098-1191 OT Time Calculation (min): 42 min MFR 478-295 10' Therex 621-3086 32'  Visit#: 6 of 16  Re-eval: 01/14/13    Authorization: [employer] medicare  Authorization Time Period: before 10th visit  Authorization Visit#: 6 of 10  Subjective Symptoms/Limitations Symptoms: S: I'm hoping my arm will be better enough to be able to go to the beach at the end of September.  Pain Assessment Currently in Pain?: Yes Pain Score: 4  Pain Location: Shoulder Pain Orientation: Left Pain Type: Acute pain  Precautions/Restrictions  Precautions Precautions: Shoulder Type of Shoulder Precautions: Follow protocol. week 3-4 (8/11-8/22) Sling may be d/c'd. AAROM  to shoulder, elbow, and forearm.  Week 4-6 (8/22-8/29) AROM to shoulder, elbow, and forearm. Full PROM by week 6. Week 8+ progress to strengthening.  Exercise/Treatments Supine Protraction: PROM;AAROM;10 reps Horizontal ABduction: PROM;AAROM;10 reps External Rotation: PROM;AAROM;10 reps Internal Rotation: PROM;AAROM;10 reps Flexion: PROM;AAROM;10 reps ABduction: PROM;AAROM;10 reps Seated Elevation: AROM;12 reps Extension: AROM;12 reps Row: AROM;12 reps Protraction: AAROM;10 reps Horizontal ABduction: AAROM;10 reps External Rotation: AAROM;10 reps Internal Rotation: AAROM;10 reps Flexion: AAROM;10 reps Abduction: AAROM;10 reps ROM / Strengthening / Isometric Strengthening Thumb Tacks: 1' Proximal Shoulder Strengthening, Supine: 10X with rest breaks after each position Prot/Ret//Elev/Dep: 1' (tactile cues needed)        Manual Therapy Manual Therapy: Myofascial release Myofascial Release: MFR and manual stretching to left elbow, upper arm, trapezius and scapularis region to increase joint mobility in a pain free zone  Occupational Therapy Assessment and Plan OT Assessment and  Plan Clinical Impression Statement: A: Added AAROM seated. patient required cues for form and technique.  OT Plan: P: Add Pulleys. Work on performing exercises with less cueing.   Goals Short Term Goals Time to Complete Short Term Goals: 4 weeks Short Term Goal 1: Patient will be educated on HEP. Short Term Goal 2: Patient will increase PROM to [facility] to decrease difficulty with donning and doffing shirts. Short Term Goal 3: Patient will increase shoulder strength to 3+/5 to be able to pull pants up over hips.  Short Term Goal 4: Patient will decrease pain level to 4/10 when completing daily activities.  Short Term Goal 5: Patient will decrease fascial restrictions in left arm from max to mod to increase ability to reach up overhead.  Long Term Goals Time to Complete Long Term Goals: 8 weeks Long Term Goal 1: Patient will return to highest level of independence with all daily and leisure activities.  Long Term Goal 2: Patient will increase AROM WNL to increase ability to complete daily activities. Long Term Goal 3: Patient will increase shoulder strength to 4/5 to be able to turn steering wheel when driving. Long Term Goal 4: Patient will decrease pain level to 2/10 or less when completing daily activities.  Long Term Goal 5: Patient will decrease fascial restrictions in left arm from mod to min to increase ability to reach up overhead.  Problem List Patient Active Problem List   Diagnosis Date Noted  . Muscle weakness (generalized) 12/17/2012  . Pain in joint, shoulder region 12/17/2012  . Shoulder fracture 12/14/2012  . Proximal humerus fracture 11/29/2012  . FRACTURE, TOE, RIGHT 09/10/2009    End of Session Activity Tolerance: Patient tolerated treatment well General Behavior During Therapy: [facility] for tasks assessed/performed   Limmie Patricia, OTR/L,CBIS   01/05/2013, 12:05 PM

## 2013-01-10 ENCOUNTER — Ambulatory Visit (HOSPITAL_COMMUNITY)
Admission: RE | Admit: 2013-01-10 | Discharge: 2013-01-10 | Disposition: A | Discharge status: Home / Self Care | Payer: Medicare Other | Source: Ambulatory Visit | Attending: Pulmonary Disease | Admitting: Pulmonary Disease

## 2013-01-10 NOTE — Progress Notes (Signed)
Occupational Therapy Treatment Patient Details  Name: Danielle [name] MRN: [id number] Date of Birth: 23-Sep-1931  Today's Date: 01/10/2013 Time: 7829-5621 OT Time Calculation (min): 44 min Manual Therapy [phone] 19' Therapeutic exercises [phone] 25' Visit#: 7 of 16  Re-eval: 01/14/13    Authorization: [employer] medicare  Authorization Time Period: before 10th visit  Authorization Visit#: 7 of 10  Subjective S:  Its getting better. Limitations: Follow protocol. week 3-4 (8/11-8/22) Sling may be d/c'd. AAROM  to shoulder, elbow, and forearm.  Week 4-6 (8/22-8/29) AROM to shoulder, elbow, and forearm. Full PROM by week 6. Week 8+ progress to strengthening. Pain Assessment Currently in Pain?: Yes Pain Score: 3  Pain Location: Shoulder Pain Orientation: Left Pain Type: Acute pain  Precautions/Restrictions    Follow protocol. week 3-4 (8/11-8/22) Sling may be d/c'd. AAROM  to shoulder, elbow, and forearm.  Week 4-6 (8/22-8/29) AROM to shoulder, elbow, and forearm. Full PROM by week 6. Week 8+ progress to strengthening.   Exercise/Treatments Supine Protraction: PROM;AAROM;10 reps Horizontal ABduction: PROM;AAROM;10 reps External Rotation: PROM;AAROM;10 reps Internal Rotation: PROM;AAROM;10 reps Flexion: PROM;AAROM;10 reps ABduction: PROM;AAROM;10 reps Seated Elevation: AROM;15 reps Extension: AROM;15 reps Row: AROM;15 reps Protraction: AAROM;10 reps Horizontal ABduction: AAROM;10 reps External Rotation: AAROM;10 reps Internal Rotation: AAROM;10 reps Flexion: AAROM;10 reps Abduction: AAROM;10 reps Therapy Ball Flexion: 20 reps ABduction: 20 reps ROM / Strengthening / Isometric Strengthening Thumb Tacks: 1' Proximal Shoulder Strengthening, Supine: resume next visit Prot/Ret//Elev/Dep: 1' (difficulty retracting without assistance)      Manual Therapy Manual Therapy: Myofascial release Myofascial Release: MFR and manual stretching to left elbow, upper arm, trapezius and  scapularis region to increase joint mobility in a pain free zone  Occupational Therapy Assessment and Plan OT Assessment and Plan Clinical Impression Statement: A:  Increased independence with AAROM in supine and seated.  Requires tactile cues for proper form with external and internal rotation.   OT Plan: P:  Add pulleys, add wall wash and proximal shoulder strengthening in supine.    Goals Short Term Goals Time to Complete Short Term Goals: 4 weeks Short Term Goal 1: Patient will be educated on HEP. Short Term Goal 2: Patient will increase PROM to [facility] to decrease difficulty with donning and doffing shirts. Short Term Goal 2 Progress: Progressing toward goal Short Term Goal 3: Patient will increase shoulder strength to 3+/5 to be able to pull pants up over hips.  Short Term Goal 3 Progress: Progressing toward goal Short Term Goal 4: Patient will decrease pain level to 4/10 when completing daily activities.  Short Term Goal 4 Progress: Progressing toward goal Short Term Goal 5: Patient will decrease fascial restrictions in left arm from max to mod to increase ability to reach up overhead.  Long Term Goals Time to Complete Long Term Goals: 8 weeks Long Term Goal 1: Patient will return to highest level of independence with all daily and leisure activities.  Long Term Goal 1 Progress: Progressing toward goal Long Term Goal 2: Patient will increase AROM WNL to increase ability to complete daily activities. Long Term Goal 2 Progress: Progressing toward goal Long Term Goal 3: Patient will increase shoulder strength to 4/5 to be able to turn steering wheel when driving. Long Term Goal 3 Progress: Progressing toward goal Long Term Goal 4: Patient will decrease pain level to 2/10 or less when completing daily activities.  Long Term Goal 4 Progress: Progressing toward goal Long Term Goal 5: Patient will decrease fascial restrictions in left arm from mod to  min to increase ability to reach up  overhead. Long Term Goal 5 Progress: Progressing toward goal  Problem List Patient Active Problem List   Diagnosis Date Noted  . Muscle weakness (generalized) 12/17/2012  . Pain in joint, shoulder region 12/17/2012  . Shoulder fracture 12/14/2012  . Proximal humerus fracture 11/29/2012  . FRACTURE, TOE, RIGHT 09/10/2009    End of Session Activity Tolerance: Patient tolerated treatment well General Behavior During Therapy: [facility] for tasks assessed/performed  GO    Shirlean Mylar, OTR/L  01/10/2013, 10:45 AM

## 2013-01-12 ENCOUNTER — Ambulatory Visit (HOSPITAL_COMMUNITY)
Admission: RE | Admit: 2013-01-12 | Discharge: 2013-01-12 | Disposition: A | Discharge status: Home / Self Care | Payer: Medicare Other | Source: Ambulatory Visit | Attending: Pulmonary Disease | Admitting: Pulmonary Disease

## 2013-01-12 NOTE — Evaluation (Signed)
Occupational Therapy Reassessment  Patient Details  Name: Danielle [name] MRN: [id number] Date of Birth: 10/16/1931  Today's Date: 01/12/2013 Time: 1020-1104 OT Time Calculation (min): 44 min MFR 1020-1030 10' Therex [phone] 8' Reassess [phone] 26'  Visit#: 8 of 16  Re-eval: 01/26/13  Assessment Diagnosis: Left proximal humerus fx  Authorization: [employer] medicare  Authorization Time Period: before 18th visit  Authorization Visit#: 8 of 18   Past Medical History:  Past Medical History  Diagnosis Date  . Diabetes mellitus without complication    Past Surgical History:  Past Surgical History  Procedure Laterality Date  . Breast enhancement surgery    . Tubal ligation      Subjective Pain Assessment Currently in Pain?: Yes Pain Score: 3  Pain Location: Shoulder Pain Orientation: Left Pain Type: Acute pain  Precautions/Restrictions  Precautions Precautions: Shoulder Type of Shoulder Precautions: Follow protocol. week 3-4 (8/11-8/22) Sling may be d/c'd. AAROM  to shoulder, elbow, and forearm.  Week 4-6 (8/22-8/29) AROM to shoulder, elbow, and forearm. Full PROM by week 6. Week 8+ progress to strengthening.   Assessment Additional Assessments LUE Assessment LUE Assessment:  (assessed supine and seated. IR/ER Adducted) LUE AROM (degrees) Left Shoulder Flexion:  (160/ 119 on eval: 120 PROM supine) Left Shoulder ABduction:  (130/117 on eval: 70 PROM supine) Left Shoulder Internal Rotation:  (90/90 on eval: 90 PROM supine) Left Shoulder External Rotation:  (69/69 on eval: 35 PROM supine) Left Elbow Flexion: 44 (on eval: 55 PROM supine) Left Elbow Extension: 0 (on eval: lacked 20 degrees from 0) LUE Strength LUE Overall Strength Comments:  (strength not assessed at eval.) Left Shoulder Flexion: 3/5 Left Shoulder ABduction: 3/5 Left Shoulder Internal Rotation: 3/5 Left Shoulder External Rotation: 3/5     Exercise/Treatments Supine Protraction: PROM;AROM;10  reps Horizontal ABduction: PROM;AROM;10 reps External Rotation: PROM;AROM;10 reps Internal Rotation: PROM;AROM;10 reps Flexion: PROM;AROM;10 reps ABduction: PROM;AROM;10 reps      Manual Therapy Manual Therapy: Myofascial release Myofascial Release: MFR and manual stretching to right elbow, forearm and wrist region to decrease fascial restrictions and increase joint mobility.  Occupational Therapy Assessment and Plan OT Assessment and Plan Clinical Impression Statement: A: See MD note for progress. Patient has met all short term goals and is progressing towards long term goals. Added AROM supine exercises. Patient tolerated well. Some difficulty with abduction due to decrease shoulder stability.  OT Frequency: Min 2X/week OT Duration: 2 weeks OT Plan: P: Add wall wash, AROM seated, and work on increasing shoulder stability needed for functional tasks.    Goals Short Term Goals Time to Complete Short Term Goals: 4 weeks Short Term Goal 1: Patient will be educated on HEP. Short Term Goal 2: Patient will increase PROM to [facility] to decrease difficulty with donning and doffing shirts. Short Term Goal 2 Progress: Met Short Term Goal 3: Patient will increase shoulder strength to 3+/5 to be able to pull pants up over hips.  Short Term Goal 3 Progress: Met Short Term Goal 4: Patient will decrease pain level to 4/10 when completing daily activities.  Short Term Goal 4 Progress: Met Short Term Goal 5: Patient will decrease fascial restrictions in left arm from max to mod to increase ability to reach up overhead.  Short Term Goal 5 Progress: Met Long Term Goals Time to Complete Long Term Goals: 8 weeks Long Term Goal 1: Patient will return to highest level of independence with all daily and leisure activities.  Long Term Goal 1 Progress: Progressing toward goal Long  Term Goal 2: Patient will increase AROM WNL to increase ability to complete daily activities. Long Term Goal 2 Progress:  Progressing toward goal Long Term Goal 3: Patient will increase shoulder strength to 4/5 to be able to turn steering wheel when driving. Long Term Goal 3 Progress: Progressing toward goal Long Term Goal 4: Patient will decrease pain level to 2/10 or less when completing daily activities.  Long Term Goal 4 Progress: Progressing toward goal Long Term Goal 5: Patient will decrease fascial restrictions in left arm from mod to min to increase ability to reach up overhead. Long Term Goal 5 Progress: Progressing toward goal  Problem List Patient Active Problem List   Diagnosis Date Noted  . Muscle weakness (generalized) 12/17/2012  . Pain in joint, shoulder region 12/17/2012  . Shoulder fracture 12/14/2012  . Proximal humerus fracture 11/29/2012  . FRACTURE, TOE, RIGHT 09/10/2009    End of Session Activity Tolerance: Patient tolerated treatment well General Behavior During Therapy: [facility] for tasks assessed/performed  GO Functional Assessment Tool Used: DASH score of 34.0 with ideal score of 0. (On eval: 67.5) Functional Limitation: Carrying, moving and handling objects Carrying, Moving and Handling Objects Current Status [phone]): At least 20 percent but less than 40 percent impaired, limited or restricted Carrying, Moving and Handling Objects Goal Status [id number]): At least 1 percent but less than 20 percent impaired, limited or restricted  Limmie Patricia, OTR/L,CBIS   01/12/2013, 1:29 PM  Physician Documentation Your signature is required to indicate approval of the treatment plan as stated above.  Please sign and either send electronically or make a copy of this report for your files and return this physician signed original.  Please mark one 1.__approve of plan  2. ___approve of plan with the following conditions.   ______________________________                                                          _____________________ Physician Signature                                                                                                              Date

## 2013-01-18 ENCOUNTER — Ambulatory Visit (HOSPITAL_COMMUNITY)
Admission: RE | Admit: 2013-01-18 | Discharge: 2013-01-18 | Disposition: A | Discharge status: Home / Self Care | Payer: Medicare Other | Source: Ambulatory Visit | Attending: Pulmonary Disease | Admitting: Pulmonary Disease

## 2013-01-18 DIAGNOSIS — IMO0001 Reserved for inherently not codable concepts without codable children: Secondary | ICD-10-CM | POA: Insufficient documentation

## 2013-01-18 DIAGNOSIS — M25519 Pain in unspecified shoulder: Secondary | ICD-10-CM | POA: Insufficient documentation

## 2013-01-18 DIAGNOSIS — M6281 Muscle weakness (generalized): Secondary | ICD-10-CM | POA: Insufficient documentation

## 2013-01-18 NOTE — Progress Notes (Signed)
Occupational Therapy Treatment Patient Details  Name: Danielle [name] MRN: [id number] Date of Birth: [birth year]  Today's Date: 01/18/2013 Time: 1022-1057 OT Time Calculation (min): 35 min Manual Therapy 7829-5621 11' Therapeutic exercises 1033-1057 24' Visit#: 9 of 16  Re-eval: 01/26/13    Authorization: [employer] medicare  Authorization Time Period: before 18th visit  Authorization Visit#: 9 of 18  Subjective : S:  I can raise it over my head some and it hurts. Limitations: Follow protocol. week 3-4 (8/11-8/22) Sling may be d/c'd. AAROM  to shoulder, elbow, and forearm.  Week 4-6 (8/22-8/29) AROM to shoulder, elbow, and forearm. Full PROM by week 6. Week 8+ progress to strengthening. Pain Assessment Currently in Pain?: Yes Pain Score: 2  Pain Location: Shoulder Pain Orientation: Left Pain Type: Acute pain  Precautions/Restrictions   progress as tolerated  Exercise/Treatments Supine Protraction: PROM;AROM;10 reps Horizontal ABduction: PROM;AROM;10 reps External Rotation: PROM;AROM;10 reps Internal Rotation: PROM;AROM;10 reps Flexion: PROM;AROM;10 reps ABduction: PROM;AROM;10 reps Seated Elevation: AROM;15 reps Extension: AROM;15 reps Row: AROM;15 reps Protraction: AROM;10 reps Horizontal ABduction: AROM;10 reps External Rotation: AROM;10 reps Internal Rotation: AROM;10 reps Flexion: AROM;10 reps Abduction: AROM;10 reps Therapy Ball Right/Left: 5 reps ROM / Strengthening / Isometric Strengthening UBE (Upper Arm Bike): 2' and 2' 1.0 Wall Wash: attempted 2' and stopped at 1'40" Proximal Shoulder Strengthening, Seated: 10 X each, resting after each exercise and tactile cuing from OT to depress shoulder blade      Manual Therapy Manual Therapy: Myofascial release Myofascial Release: MFR and manual stretching to left elbow, upper arm, trapezius and scapularis region to increase joint mobility in a pain free zone  Occupational Therapy Assessment and Plan OT  Assessment and Plan Clinical Impression Statement: A:  Added AROM in seated with good form.  Required tactile cuing to depress scapula while completing seated proximal shoulder strengthening.   OT Plan: P:  Attempt full 2 minutes with wall wash activity.  Increase supine AROM repetitions.    Goals Short Term Goals Time to Complete Short Term Goals: 4 weeks Short Term Goal 1: Patient will be educated on HEP. Short Term Goal 2: Patient will increase PROM to [facility] to decrease difficulty with donning and doffing shirts. Short Term Goal 3: Patient will increase shoulder strength to 3+/5 to be able to pull pants up over hips.  Short Term Goal 4: Patient will decrease pain level to 4/10 when completing daily activities.  Short Term Goal 5: Patient will decrease fascial restrictions in left arm from max to mod to increase ability to reach up overhead.  Long Term Goals Time to Complete Long Term Goals: 8 weeks Long Term Goal 1: Patient will return to highest level of independence with all daily and leisure activities.  Long Term Goal 1 Progress: Progressing toward goal Long Term Goal 2: Patient will increase AROM WNL to increase ability to complete daily activities. Long Term Goal 2 Progress: Progressing toward goal Long Term Goal 3: Patient will increase shoulder strength to 4/5 to be able to turn steering wheel when driving. Long Term Goal 3 Progress: Progressing toward goal Long Term Goal 4: Patient will decrease pain level to 2/10 or less when completing daily activities.  Long Term Goal 4 Progress: Progressing toward goal Long Term Goal 5: Patient will decrease fascial restrictions in left arm from mod to min to increase ability to reach up overhead. Long Term Goal 5 Progress: Progressing toward goal  Problem List Patient Active Problem List   Diagnosis Date Noted  . Muscle  weakness (generalized) 12/17/2012  . Pain in joint, shoulder region 12/17/2012  . Shoulder fracture 12/14/2012  .  Proximal humerus fracture 11/29/2012  . FRACTURE, TOE, RIGHT 09/10/2009       GO    Shirlean Mylar, OTR/L  01/18/2013, 10:54 AM

## 2013-01-21 ENCOUNTER — Ambulatory Visit (HOSPITAL_COMMUNITY)
Admission: RE | Admit: 2013-01-21 | Discharge: 2013-01-21 | Disposition: A | Discharge status: Home / Self Care | Payer: Medicare Other | Source: Ambulatory Visit | Attending: Pulmonary Disease | Admitting: Pulmonary Disease

## 2013-01-21 NOTE — Progress Notes (Signed)
Occupational Therapy Treatment Patient Details  Name: Danielle [name] MRN: [id number] Date of Birth: 01/30/1932  Today's Date: 01/21/2013 Time: 1191-4782 OT Time Calculation (min): 37 min Manual Therapy 9562-1308 14' Therapeutic exercises 1122-1145 23' Visit#: 10 of 16  Re-eval: 01/26/13    Authorization: [employer] medicare  Authorization Time Period: before 18th visit  Authorization Visit#: 10 of 18  Subjective S:  Ive been driving.  My shoulder is a little sore because of driving.  Limitations: Follow protocol. week 3-4 (8/11-8/22) Sling may be d/c'd. AAROM  to shoulder, elbow, and forearm.  Week 4-6 (8/22-8/29) AROM to shoulder, elbow, and forearm. Full PROM by week 6. Week 8+ progress to strengthening. Pain Assessment Currently in Pain?: No/denies Pain Score: 0-No pain  Precautions/Restrictions   progress as tolerated  Exercise/Treatments Supine Protraction: PROM;5 reps;AROM;12 reps Horizontal ABduction: PROM;5 reps;AROM;12 reps External Rotation: PROM;5 reps;AROM;12 reps Internal Rotation: PROM;5 reps;AROM;12 reps Flexion: PROM;5 reps;AROM;12 reps ABduction: PROM;5 reps;AROM;12 reps Seated Extension: Theraband;10 reps Theraband Level (Shoulder Extension): Level 2 (Red) Retraction: Theraband;10 reps Theraband Level (Shoulder Retraction): Level 2 (Red) Row: Theraband;10 reps Theraband Level (Shoulder Row): Level 2 (Red) Protraction: AROM;10 reps Horizontal ABduction: AROM;10 reps External Rotation: AROM;10 reps Internal Rotation: AROM;10 reps Flexion: AROM;10 reps Abduction: AROM;10 reps Therapy Ball Right/Left: 5 reps ROM / Strengthening / Isometric Strengthening UBE (Upper Arm Bike): 3' and 3' at 1.0 Wall Wash: wall wash 3' forwad and 3' in reverse Proximal Shoulder Strengthening, Supine: 10 times each without resting Proximal Shoulder Strengthening, Seated: 10 X each, resting after each exercise and tactile cuing from OT to depress shoulder blade        Manual Therapy Manual Therapy: Myofascial release Myofascial Release: MFR and manual stretching to left elbow, upper arm, trapezius and scapularis region to increase joint mobility in a pain free zone  Occupational Therapy Assessment and Plan OT Assessment and Plan Clinical Impression Statement: A:  Able to complete wall wash for entire 2', last session had to stop at 1\' 40" . OT Plan: P:  Increase sustained activity tolerance with shoulder height functional activites.     Goals Short Term Goals Time to Complete Short Term Goals: 4 weeks Short Term Goal 1: Patient will be educated on HEP. Short Term Goal 2: Patient will increase PROM to [facility] to decrease difficulty with donning and doffing shirts. Short Term Goal 3: Patient will increase shoulder strength to 3+/5 to be able to pull pants up over hips.  Short Term Goal 4: Patient will decrease pain level to 4/10 when completing daily activities.  Short Term Goal 5: Patient will decrease fascial restrictions in left arm from max to mod to increase ability to reach up overhead.  Long Term Goals Time to Complete Long Term Goals: 8 weeks Long Term Goal 1: Patient will return to highest level of independence with all daily and leisure activities.  Long Term Goal 2: Patient will increase AROM WNL to increase ability to complete daily activities. Long Term Goal 3: Patient will increase shoulder strength to 4/5 to be able to turn steering wheel when driving. Long Term Goal 4: Patient will decrease pain level to 2/10 or less when completing daily activities.  Long Term Goal 5: Patient will decrease fascial restrictions in left arm from mod to min to increase ability to reach up overhead.  Problem List Patient Active Problem List   Diagnosis Date Noted  . Muscle weakness (generalized) 12/17/2012  . Pain in joint, shoulder region 12/17/2012  . Shoulder fracture 12/14/2012  .  Proximal humerus fracture 11/29/2012  . FRACTURE, TOE, RIGHT 09/10/2009     End of Session Activity Tolerance: Patient tolerated treatment well General Behavior During Therapy: [facility] for tasks assessed/performed  GO    Shirlean Mylar, OTR/L  01/21/2013, 12:11 PM

## 2013-01-24 ENCOUNTER — Ambulatory Visit (HOSPITAL_COMMUNITY)
Admission: RE | Admit: 2013-01-24 | Discharge: 2013-01-24 | Disposition: A | Discharge status: Home / Self Care | Payer: Medicare Other | Source: Ambulatory Visit | Attending: Pulmonary Disease | Admitting: Pulmonary Disease

## 2013-01-24 NOTE — Evaluation (Signed)
Occupational Therapy Evaluation  Patient Details  Name: Danielle [name] MRN: [id number] Date of Birth: January 06, 1932  Today's Date: 01/24/2013 Time: 1020-1100 OT Time Calculation (min): 40 min Manual Therapy 1020-1033 13' ROM/MMT 4098-1191 10' Therapeutic exercises 1043-1100 17' Visit#: 11 of 16  Re-eval: 02/21/13     Authorization: [employer] medicare  Authorization Time Period: before   Authorization Visit#: 11 of 18   Past Medical History:  Past Medical History  Diagnosis Date  . Diabetes mellitus without complication    Past Surgical History:  Past Surgical History  Procedure Laterality Date  . Breast enhancement surgery    . Tubal ligation      Subjective S:  My arm muscle has been a little sore.  Limitations: Follow protocol. week 3-4 (8/11-8/22) Sling may be d/c'd. AAROM  to shoulder, elbow, and forearm.  Week 4-6 (8/22-8/29) AROM to shoulder, elbow, and forearm. Full PROM by week 6. Week 8+ progress to strengthening. Pain Assessment Currently in Pain?: Yes Pain Score: 2  Pain Location: Shoulder Pain Orientation: Left Pain Type: Acute pain  Assessment  Additional Assessments LUE AROM (degrees) LUE Overall AROM Comments: assessed in seated ER/IR with shoulder adducted (8/27 with shoulder adducted) Left Shoulder Flexion: 122 Degrees (119) Left Shoulder ABduction: 145 Degrees (117) Left Shoulder Internal Rotation: 50 Degrees (90) Left Shoulder External Rotation: 72 Degrees (69) LUE Strength Left Shoulder Flexion: 4/5 (3/5) Left Shoulder ABduction: 4/5 (3/5) Left Shoulder Internal Rotation: 4/5 (3/5) Left Shoulder External Rotation: 4/5 (3/5)     Exercise/Treatments Supine Protraction: PROM;5 reps;AROM;15 reps Horizontal ABduction: PROM;5 reps;AROM;15 reps External Rotation: PROM;5 reps;AROM;15 reps Internal Rotation: PROM;5 reps;AROM;15 reps Flexion: PROM;5 reps;AROM;15 reps ABduction: PROM;5 reps;AROM;15 reps Seated Protraction: AROM;15 reps Horizontal  ABduction: AROM;15 reps External Rotation: AROM;15 reps Internal Rotation: AROM;15 reps Flexion: AROM;15 reps Abduction: AROM;15 reps ROM / Strengthening / Isometric Strengthening UBE (Upper Arm Bike): 3' and 3' at 1.0 Wall Wash: wall wash 3' Proximal Shoulder Strengthening, Supine: 10 times each without resting      Manual Therapy Manual Therapy: Myofascial release Myofascial Release: MFR and manual stretching to left elbow, upper arm, trapezius and scapularis region to increase joint mobility in a pain free zone  Occupational Therapy Assessment and Plan OT Assessment and Plan Clinical Impression Statement: A:  Increased I with all daily activities, able to carry a pitcher of water, drive.  Difficulty donning bra and turning steering wheel, and strength is not back to [facility].  OT Frequency: Min 2X/week OT Duration: 4 weeks OT Plan: P:  Increase AROM and strength to WNL for full participation in ADLs.  Decrease compensatory movements (shoulder elevation.)   Goals Short Term Goals Time to Complete Short Term Goals: 4 weeks Short Term Goal 1: Patient will be educated on HEP. Short Term Goal 2: Patient will increase PROM to [facility] to decrease difficulty with donning and doffing shirts. Short Term Goal 3: Patient will increase shoulder strength to 3+/5 to be able to pull pants up over hips.  Short Term Goal 4: Patient will decrease pain level to 4/10 when completing daily activities.  Short Term Goal 5: Patient will decrease fascial restrictions in left arm from max to mod to increase ability to reach up overhead.  Long Term Goals Time to Complete Long Term Goals: 8 weeks Long Term Goal 1: Patient will return to highest level of independence with all daily and leisure activities.  Long Term Goal 1 Progress: Progressing toward goal Long Term Goal 2: Patient will increase AROM WNL  to increase ability to complete daily activities. Long Term Goal 2 Progress: Progressing toward goal Long Term  Goal 3: Patient will increase shoulder strength to 4/5 to be able to turn steering wheel when driving. Long Term Goal 3 Progress: Progressing toward goal Long Term Goal 4: Patient will decrease pain level to 2/10 or less when completing daily activities.  Long Term Goal 4 Progress: Met Long Term Goal 5: Patient will decrease fascial restrictions in left arm from mod to min to increase ability to reach up overhead. Long Term Goal 5 Progress: Met  Problem List Patient Active Problem List   Diagnosis Date Noted  . Muscle weakness (generalized) 12/17/2012  . Pain in joint, shoulder region 12/17/2012  . Shoulder fracture 12/14/2012  . Proximal humerus fracture 11/29/2012  . FRACTURE, TOE, RIGHT 09/10/2009    General Behavior During Therapy: [facility] for tasks assessed/performed  GO   Shirlean Mylar, OTR/L  01/24/2013, 11:08 AM  Physician Documentation Your signature is required to indicate approval of the treatment plan as stated above.  Please sign and either send electronically or make a copy of this report for your files and return this physician signed original.  Please mark one 1.__approve of plan  2. ___approve of plan with the following conditions.   ______________________________                                                          _____________________ Physician Signature                                                                                                             Date

## 2013-01-25 ENCOUNTER — Ambulatory Visit (INDEPENDENT_AMBULATORY_CARE_PROVIDER_SITE_OTHER): Payer: Medicare Other | Admitting: Orthopedic Surgery

## 2013-01-25 ENCOUNTER — Encounter: Payer: Self-pay | Admitting: Orthopedic Surgery

## 2013-01-25 VITALS — BP 163/83 | Ht 64.0 in | Wt 110.0 lb

## 2013-01-25 DIAGNOSIS — S42309D Unspecified fracture of shaft of humerus, unspecified arm, subsequent encounter for fracture with routine healing: Secondary | ICD-10-CM

## 2013-01-25 DIAGNOSIS — S42202D Unspecified fracture of upper end of left humerus, subsequent encounter for fracture with routine healing: Secondary | ICD-10-CM

## 2013-01-25 NOTE — Progress Notes (Signed)
Patient ID: Danielle [name], female   DOB: Aug 24, 1931, 79 y.o.   MRN: [id number]  Chief Complaint  Patient presents with  . Follow-up    6 week recheck left shoulder ROM s/p fracture 11/27/12   BP 163/83  Ht 5\' 4"  (1.626 m)  Wt 110 lb (49.896 kg)  BMI 18.87 kg/m2  Left proximal humerus fracture  The patient has only minimal discomfort when bringing her arm across her chest she's regained her range of motion her strength is improved  She can continue exercises at home complete her last physical therapy visit next week followup as needed

## 2013-01-25 NOTE — Patient Instructions (Signed)
Continue Home Exercises

## 2013-01-27 ENCOUNTER — Ambulatory Visit (HOSPITAL_COMMUNITY)
Admission: RE | Admit: 2013-01-27 | Discharge: 2013-01-27 | Disposition: A | Discharge status: Home / Self Care | Payer: Medicare Other | Source: Ambulatory Visit | Attending: Pulmonary Disease | Admitting: Pulmonary Disease

## 2013-01-27 NOTE — Progress Notes (Signed)
Occupational Therapy Treatment Patient Details  Name: EFFA YARROW MRN: [id number] Date of Birth: Oct 06, 1931  Today's Date: 01/27/2013 Time: 1020-1105 OT Time Calculation (min): 45 min Manual Therapy 1020-1045 25' Therapeutic exercises 1045-1105 20' Visit#: 12 of 16  Re-eval: 02/21/13    Authorization: [employer] medicare  Authorization Time Period: before 18th visit  Authorization Visit#: 12 of 18  Subjective S:  Dr. Romeo Apple said I am doing great.  He wants me to do 2 more weeks and then transiton to a HEP. Limitations: Follow protocol. week 3-4 (8/11-8/22) Sling may be d/c'd. AAROM  to shoulder, elbow, and forearm.  Week 4-6 (8/22-8/29) AROM to shoulder, elbow, and forearm. Full PROM by week 6. Week 8+ progress to strengthening. Pain Assessment Currently in Pain?: No/denies Pain Score: 0-No pain  Precautions/Restrictions    Follow protocol. week 3-4 (8/11-8/22) Sling may be d/c'd. AAROM  to shoulder, elbow, and forearm.  Week 4-6 (8/22-8/29) AROM to shoulder, elbow, and forearm. Full PROM by week 6. Week 8+ progress to strengthening.  Exercise/Treatments Supine Protraction: PROM;Strengthening;10 reps Protraction Weight (lbs): 1 Horizontal ABduction: PROM;Strengthening;10 reps Horizontal ABduction Weight (lbs): 1 External Rotation: PROM;Strengthening;10 reps External Rotation Weight (lbs): 1 Internal Rotation: PROM;Strengthening;10 reps Internal Rotation Weight (lbs): 1 Flexion: PROM;Strengthening;10 reps Shoulder Flexion Weight (lbs): 1 ABduction: PROM;Strengthening;10 reps Shoulder ABduction Weight (lbs): 1 Seated Protraction: Strengthening;10 reps Protraction Weight (lbs): 1 Horizontal ABduction: Strengthening;10 reps Horizontal ABduction Weight (lbs): 1 External Rotation: Strengthening;10 reps External Rotation Weight (lbs): 1 Internal Rotation: Strengthening;10 reps Internal Rotation Weight (lbs): 1 Flexion: Strengthening;10 reps Flexion Weight (lbs):  1 Abduction: AROM;10 reps;Limitations ABduction Limitations: tatctile and verbal cues cues to maintain extended elbow during exercise ROM / Strengthening / Isometric Strengthening UBE (Upper Arm Bike): 3' and 3' 1.5 Wall Wash: wall wash 3' "W" Arms: left arm only with hand over hand facilitation X to V Arms: 10 X with hand over hand facilitation  Proximal Shoulder Strengthening, Seated: 10 X each without resting       Manual Therapy Manual Therapy: Myofascial release Myofascial Release: MFR and manual stretching to left elbow, upper arm, trapezius and scapularis region to increase joint mobility in a pain free zone  Occupational Therapy Assessment and Plan OT Assessment and Plan Clinical Impression Statement: A:  Added strengthening in supine and seated, unable to complete strengthening of abduction in seated.   OT Plan: P:  Attempt 1# resistance with seated abduction strengthening.     Goals Short Term Goals Time to Complete Short Term Goals: 4 weeks Short Term Goal 1: Patient will be educated on HEP. Short Term Goal 2: Patient will increase PROM to [facility] to decrease difficulty with donning and doffing shirts. Short Term Goal 3: Patient will increase shoulder strength to 3+/5 to be able to pull pants up over hips.  Short Term Goal 4: Patient will decrease pain level to 4/10 when completing daily activities.  Short Term Goal 5: Patient will decrease fascial restrictions in left arm from max to mod to increase ability to reach up overhead.  Long Term Goals Time to Complete Long Term Goals: 8 weeks Long Term Goal 1: Patient will return to highest level of independence with all daily and leisure activities.  Long Term Goal 2: Patient will increase AROM WNL to increase ability to complete daily activities. Long Term Goal 3: Patient will increase shoulder strength to 4/5 to be able to turn steering wheel when driving. Long Term Goal 4: Patient will decrease pain level to 2/10 or less  when  completing daily activities.  Long Term Goal 5: Patient will decrease fascial restrictions in left arm from mod to min to increase ability to reach up overhead.  Problem List Patient Active Problem List   Diagnosis Date Noted  . Muscle weakness (generalized) 12/17/2012  . Pain in joint, shoulder region 12/17/2012  . Shoulder fracture 12/14/2012  . Proximal humerus fracture 11/29/2012  . FRACTURE, TOE, RIGHT 09/10/2009    End of Session Activity Tolerance: Patient tolerated treatment well General Behavior During Therapy: [facility] for tasks assessed/performed  GO    Shirlean Mylar, OTR/L  01/27/2013, 11:05 AM

## 2013-01-31 ENCOUNTER — Ambulatory Visit (HOSPITAL_COMMUNITY)
Admission: RE | Admit: 2013-01-31 | Discharge: 2013-01-31 | Disposition: A | Discharge status: Home / Self Care | Payer: Medicare Other | Source: Ambulatory Visit | Attending: Pulmonary Disease | Admitting: Pulmonary Disease

## 2013-01-31 NOTE — Progress Notes (Signed)
Occupational Therapy Treatment Patient Details  Name: JET TRAYNHAM MRN: [id number] Date of Birth: July 22, 1931  Today's Date: 01/31/2013 Time: 1020-1103 OT Time Calculation (min): 43 min 1020-1035 15' manual therapy, 7829-5621 28' therapeutic exercise  Visit#: 13 of 16  Re-eval: 02/21/13 Assessment Diagnosis: Left proximal humerus fx  Authorization: [employer] medicare  Authorization Time Period: before 18th visit  Authorization Visit#: 12 of 18  Subjective Symptoms/Limitations Symptoms: I have one more visit left.  The doctor said I was doing great. Pertinent History: On November 27, 2012, Zuleyma's sugar dropped causing her to fall on her left shoulder causing a left proximal humerus fx. No surgery was done. Patient has been wearing a sling 24/7. Dr. Romeo Apple has referred patient to occupational therapy for evaluation and treatment.   Precautions/Restrictions    Follow protocol. week 3-4 (8/11-8/22) Sling may be d/c'd. AAROM to shoulder, elbow, and forearm. Week 4-6 (8/22-8/29) AROM to shoulder, elbow, and forearm. Full PROM by week 6. Week 8+ progress to strengthening.      Exercise/Treatments Supine Protraction: PROM;Strengthening;12 reps Protraction Weight (lbs): 1 lb Horizontal ABduction: PROM;Strengthening;12 reps Horizontal ABduction Weight (lbs): 1 lb External Rotation: PROM;Strengthening;12 reps External Rotation Weight (lbs): 1 lb Internal Rotation: PROM;Strengthening;12 reps Internal Rotation Weight (lbs): 1 lb Flexion: PROM;Strengthening;12 reps Shoulder Flexion Weight (lbs): 1 lb ABduction: PROM;Strengthening;12 reps Shoulder ABduction Weight (lbs): 1 lb Seated Protraction: Strengthening;10 reps Protraction Weight (lbs): 1 Horizontal ABduction: Strengthening;10 reps Horizontal ABduction Weight (lbs): 1 External Rotation: Strengthening;10 reps External Rotation Weight (lbs): 1 Internal Rotation: Strengthening;10 reps Internal Rotation Weight (lbs): 1 Flexion:  Strengthening;10 reps Flexion Weight (lbs): 1 Abduction: Strengthening;10 reps ABduction Limitations: 1 lb UBE (Upper Arm Bike): 3' and 3' 1.5 Wall Wash: wall wash 3' "W" Arms: left arm only with hand over hand facilitation X to V Arms: 10 X  Proximal Shoulder Strengthening, Supine: 10 times each resting between with 1 lb      Manual Therapy Manual Therapy: Myofascial release Myofascial Release: MFR and manual stretching to left elbow, upper arm, trapezius and scapularis region to increase joint mobility in a pain free zone  Occupational Therapy Assessment and Plan OT Assessment and Plan Clinical Impression Statement: A: Attempted 1# resistance with seated abduction strengthening. patient tolerated well and did not require hand over hand assistance. Patient required tactile facilitation to complete W arms and strengthening seated horizontal abduction.  OT Plan: P: Reassess and discharge.   Goals Short Term Goals Time to Complete Short Term Goals: 4 weeks Short Term Goal 1: Patient will be educated on HEP. Short Term Goal 2: Patient will increase PROM to [facility] to decrease difficulty with donning and doffing shirts. Short Term Goal 3: Patient will increase shoulder strength to 3+/5 to be able to pull pants up over hips.  Short Term Goal 4: Patient will decrease pain level to 4/10 when completing daily activities.  Short Term Goal 5: Patient will decrease fascial restrictions in left arm from max to mod to increase ability to reach up overhead.  Long Term Goals Time to Complete Long Term Goals: 8 weeks Long Term Goal 1: Patient will return to highest level of independence with all daily and leisure activities.  Long Term Goal 1 Progress: Progressing toward goal Long Term Goal 2: Patient will increase AROM WNL to increase ability to complete daily activities. Long Term Goal 2 Progress: Progressing toward goal Long Term Goal 3: Patient will increase shoulder strength to 4/5 to be able to  turn steering wheel when driving. Long  Term Goal 3 Progress: Progressing toward goal Long Term Goal 4: Patient will decrease pain level to 2/10 or less when completing daily activities.  Long Term Goal 5: Patient will decrease fascial restrictions in left arm from mod to min to increase ability to reach up overhead.  Problem List Patient Active Problem List   Diagnosis Date Noted  . Muscle weakness (generalized) 12/17/2012  . Pain in joint, shoulder region 12/17/2012  . Shoulder fracture 12/14/2012  . Proximal humerus fracture 11/29/2012  . FRACTURE, TOE, RIGHT 09/10/2009    End of Session Activity Tolerance: Patient tolerated treatment well General Behavior During Therapy: [facility] for tasks assessed/performed   Limmie Patricia, OTR/L,CBIS   01/31/2013, 11:20 AM

## 2013-02-03 ENCOUNTER — Ambulatory Visit (HOSPITAL_COMMUNITY)
Admission: RE | Admit: 2013-02-03 | Discharge: 2013-02-03 | Disposition: A | Discharge status: Home / Self Care | Payer: Medicare Other | Source: Ambulatory Visit | Attending: Pulmonary Disease | Admitting: Pulmonary Disease

## 2013-02-03 NOTE — Progress Notes (Signed)
Occupational Therapy Treatment Patient Details  Name: Danielle [name] MRN: [id number] Date of Birth: [birth year]  Today's Date: 02/03/2013 Time: 4098-1191 OT Time Calculation (min): 31 min Manual Therapy 1023-1040 17' Therapeutic exercises 4782-9562 9' Visit#: 14 of 16     Authorization: [employer] medicare  Authorization Time Period: before 18th visit  Authorization Visit#: 14 of 18  Subjective S:  I am ready to graduate. Limitations: Follow protocol. week 3-4 (8/11-8/22) Sling may be d/c'd. AAROM  to shoulder, elbow, and forearm.  Week 4-6 (8/22-8/29) AROM to shoulder, elbow, and forearm. Full PROM by week 6. Week 8+ progress to strengthening. Pain Assessment Currently in Pain?: No/denies Pain Score: 0-No pain  Precautions/Restrictions   progress as tolerated  Exercise/Treatments Supine Protraction: PROM;10 reps Horizontal ABduction: PROM;10 reps External Rotation: PROM;10 reps Internal Rotation: PROM;10 reps Flexion: PROM;10 reps ABduction: PROM;10 reps Standing External Rotation: Theraband;10 reps Theraband Level (Shoulder External Rotation): Level 3 (Green) Internal Rotation: Theraband;10 reps Theraband Level (Shoulder Internal Rotation): Level 3 (Green) Extension: Theraband;10 reps Theraband Level (Shoulder Extension): Level 3 (Green) Row: Theraband;10 reps Theraband Level (Shoulder Row): Level 3 (Green) Retraction: Theraband;10 reps Theraband Level (Shoulder Retraction): Level 3 (Green)    Manual Therapy Manual Therapy: Myofascial release Myofascial Release: MFR and manual stretching to left elbow, upper arm, trapezius and scapularis region to increase joint mobility in a pain free zone  Occupational Therapy Assessment and Plan OT Assessment and Plan Clinical Impression Statement: A:  Patient educated on HEP for tband for proximal shoulder stability and rotator cuff strengthening.  She feels she has returned to prior level of I with all B/IADLs and leisure  activities.  She has met all short term and long term goals.   OT Plan: P:  DC from skilled OT intervention this date.    Goals Short Term Goals Time to Complete Short Term Goals: 4 weeks Short Term Goal 1: Patient will be educated on HEP. Short Term Goal 2: Patient will increase PROM to [facility] to decrease difficulty with donning and doffing shirts. Short Term Goal 3: Patient will increase shoulder strength to 3+/5 to be able to pull pants up over hips.  Short Term Goal 4: Patient will decrease pain level to 4/10 when completing daily activities.  Short Term Goal 5: Patient will decrease fascial restrictions in left arm from max to mod to increase ability to reach up overhead.  Long Term Goals Time to Complete Long Term Goals: 8 weeks Long Term Goal 1: Patient will return to highest level of independence with all daily and leisure activities.  Long Term Goal 1 Progress: Met Long Term Goal 2: Patient will increase AROM WNL to increase ability to complete daily activities. Long Term Goal 2 Progress: Met Long Term Goal 3: Patient will increase shoulder strength to 4/5 to be able to turn steering wheel when driving. Long Term Goal 3 Progress: Met Long Term Goal 4: Patient will decrease pain level to 2/10 or less when completing daily activities.  Long Term Goal 4 Progress: Met Long Term Goal 5: Patient will decrease fascial restrictions in left arm from mod to min to increase ability to reach up overhead. Long Term Goal 5 Progress: Met  Problem List Patient Active Problem List   Diagnosis Date Noted  . Muscle weakness (generalized) 12/17/2012  . Pain in joint, shoulder region 12/17/2012  . Shoulder fracture 12/14/2012  . Proximal humerus fracture 11/29/2012  . FRACTURE, TOE, RIGHT 09/10/2009    End of Session Activity Tolerance: Patient tolerated  treatment well General Behavior During Therapy: WFL for tasks assessed/performed OT Plan of Care OT Home Exercise Plan: tband for scapular  strengthening with green tband.  Consulted and Agree with Plan of Care: Patient  GO Functional Assessment Tool Used: patient reports 100% independent Functional Limitation: Carrying, moving and handling objects Carrying, Moving and Handling Objects Goal Status ([id number]): At least 1 percent but less than 20 percent impaired, limited or restricted Carrying, Moving and Handling Objects Discharge Status [phone]): At least 1 percent but less than 20 percent impaired, limited or restricted  Shirlean Mylar, OTR/L  02/03/2013, 11:03 AM

## 2013-02-16 ENCOUNTER — Other Ambulatory Visit (HOSPITAL_COMMUNITY): Payer: Self-pay | Admitting: Pulmonary Disease

## 2013-02-16 DIAGNOSIS — Z139 Encounter for screening, unspecified: Secondary | ICD-10-CM

## 2013-03-07 ENCOUNTER — Ambulatory Visit (HOSPITAL_COMMUNITY): Payer: Medicare Other

## 2013-04-04 ENCOUNTER — Other Ambulatory Visit (HOSPITAL_COMMUNITY): Payer: Self-pay | Admitting: Pulmonary Disease

## 2013-04-04 ENCOUNTER — Ambulatory Visit (HOSPITAL_COMMUNITY)
Admission: RE | Admit: 2013-04-04 | Discharge: 2013-04-04 | Disposition: A | Discharge status: Home / Self Care | Payer: Medicare Other | Source: Ambulatory Visit | Attending: Pulmonary Disease | Admitting: Pulmonary Disease

## 2013-04-04 DIAGNOSIS — R109 Unspecified abdominal pain: Secondary | ICD-10-CM

## 2013-04-04 DIAGNOSIS — M4120 Other idiopathic scoliosis, site unspecified: Secondary | ICD-10-CM | POA: Insufficient documentation

## 2013-04-04 DIAGNOSIS — R141 Gas pain: Secondary | ICD-10-CM | POA: Insufficient documentation

## 2013-04-04 DIAGNOSIS — W19XXXA Unspecified fall, initial encounter: Secondary | ICD-10-CM | POA: Insufficient documentation

## 2013-04-04 DIAGNOSIS — R142 Eructation: Secondary | ICD-10-CM | POA: Insufficient documentation

## 2013-04-04 DIAGNOSIS — M545 Low back pain, unspecified: Secondary | ICD-10-CM

## 2013-04-04 DIAGNOSIS — M439 Deforming dorsopathy, unspecified: Secondary | ICD-10-CM | POA: Insufficient documentation

## 2013-04-04 DIAGNOSIS — R140 Abdominal distension (gaseous): Secondary | ICD-10-CM

## 2013-04-04 DIAGNOSIS — I700 Atherosclerosis of aorta: Secondary | ICD-10-CM | POA: Insufficient documentation

## 2013-04-04 DIAGNOSIS — M51379 Other intervertebral disc degeneration, lumbosacral region without mention of lumbar back pain or lower extremity pain: Secondary | ICD-10-CM | POA: Insufficient documentation

## 2013-04-04 DIAGNOSIS — IMO0002 Reserved for concepts with insufficient information to code with codable children: Secondary | ICD-10-CM

## 2013-04-04 DIAGNOSIS — M5137 Other intervertebral disc degeneration, lumbosacral region: Secondary | ICD-10-CM | POA: Insufficient documentation

## 2013-04-19 ENCOUNTER — Ambulatory Visit (HOSPITAL_COMMUNITY)
Admission: RE | Admit: 2013-04-19 | Discharge: 2013-04-19 | Disposition: A | Discharge status: Home / Self Care | Payer: Medicare Other | Source: Ambulatory Visit | Attending: Pulmonary Disease | Admitting: Pulmonary Disease

## 2013-04-19 DIAGNOSIS — M818 Other osteoporosis without current pathological fracture: Secondary | ICD-10-CM | POA: Insufficient documentation

## 2013-04-19 DIAGNOSIS — Z780 Asymptomatic menopausal state: Secondary | ICD-10-CM | POA: Insufficient documentation

## 2013-04-19 DIAGNOSIS — IMO0002 Reserved for concepts with insufficient information to code with codable children: Secondary | ICD-10-CM

## 2013-04-19 LAB — HM DEXA SCAN

## 2013-06-14 ENCOUNTER — Encounter: Payer: Self-pay | Admitting: Orthopedic Surgery

## 2013-06-14 ENCOUNTER — Ambulatory Visit (INDEPENDENT_AMBULATORY_CARE_PROVIDER_SITE_OTHER): Payer: Medicare Other | Admitting: Orthopedic Surgery

## 2013-06-14 VITALS — BP 160/96 | Ht 64.0 in | Wt 110.0 lb

## 2013-06-14 DIAGNOSIS — M545 Low back pain, unspecified: Secondary | ICD-10-CM

## 2013-06-14 HISTORY — DX: Low back pain, unspecified: M54.50

## 2013-06-14 NOTE — Patient Instructions (Signed)
Back Pain, Adult Low back pain is very common. About 1 in 5 people have back pain.The cause of low back pain is rarely dangerous. The pain often gets better over time.About half of people with a sudden onset of back pain feel better in just 2 weeks. About 8 in 10 people feel better by 6 weeks.  CAUSES Some common causes of back pain include:  Strain of the muscles or ligaments supporting the spine.  Wear and tear (degeneration) of the spinal discs.  Arthritis.  Direct injury to the back. DIAGNOSIS Most of the time, the direct cause of low back pain is not known.However, back pain can be treated effectively even when the exact cause of the pain is unknown.Answering your caregiver's questions about your overall health and symptoms is one of the most accurate ways to make sure the cause of your pain is not dangerous. If your caregiver needs more information, he or she may order lab work or imaging tests (X-rays or MRIs).However, even if imaging tests show changes in your back, this usually does not require surgery. HOME CARE INSTRUCTIONS For many people, back pain returns.Since low back pain is rarely dangerous, it is often a condition that people can learn to manageon their own.   Remain active. It is stressful on the back to sit or stand in one place. Do not sit, drive, or stand in one place for more than 30 minutes at a time. Take short walks on level surfaces as soon as pain allows.Try to increase the length of time you walk each day.  Do not stay in bed.Resting more than 1 or 2 days can delay your recovery.  Do not avoid exercise or work.Your body is made to move.It is not dangerous to be active, even though your back may hurt.Your back will likely heal faster if you return to being active before your pain is gone.  Pay attention to your body when you bend and lift. Many people have less discomfortwhen lifting if they bend their knees, keep the load close to their bodies,and  avoid twisting. Often, the most comfortable positions are those that put less stress on your recovering back.  Find a comfortable position to sleep. Use a firm mattress and lie on your side with your knees slightly bent. If you lie on your back, put a pillow under your knees.  Only take over-the-counter or prescription medicines as directed by your caregiver. Over-the-counter medicines to reduce pain and inflammation are often the most helpful.Your caregiver may prescribe muscle relaxant drugs.These medicines help dull your pain so you can more quickly return to your normal activities and healthy exercise.  Put ice on the injured area.  Put ice in a plastic bag.  Place a towel between your skin and the bag.  Leave the ice on for 15-20 minutes, 03-04 times a day for the first 2 to 3 days. After that, ice and heat may be alternated to reduce pain and spasms.  Ask your caregiver about trying back exercises and gentle massage. This may be of some benefit.  Avoid feeling anxious or stressed.Stress increases muscle tension and can worsen back pain.It is important to recognize when you are anxious or stressed and learn ways to manage it.Exercise is a great option. SEEK MEDICAL CARE IF:  You have pain that is not relieved with rest or medicine.  You have pain that does not improve in 1 week.  You have new symptoms.  You are generally not feeling well. SEEK   IMMEDIATE MEDICAL CARE IF:   You have pain that radiates from your back into your legs.  You develop new bowel or bladder control problems.  You have unusual weakness or numbness in your arms or legs.  You develop nausea or vomiting.  You develop abdominal pain.  You feel faint. Document Released: 05/05/2005 Document Revised: 11/04/2011 Document Reviewed: 09/23/2010 Exit[employer] Patient Information 2014 [employer].  

## 2013-06-14 NOTE — Progress Notes (Signed)
   Subjective:    Patient ID: Danielle [name], female    DOB: Oct 13, 1931, 80 y.o.   MRN: [id number]  HPI Comments: The patient sustained a lumbar compression fracture L1 in November then about 3 weeks ago stood up from a flexed supine position twisted and fell to 20 in her lower back. She's had some left posterior thigh pain.  Hip Pain  The incident occurred more than 1 week ago. The incident occurred at home. The injury mechanism was a twisting injury. The pain is present in the left hip and left thigh (back). The quality of the pain is described as aching. The pain is at a severity of 7/10. The pain has been fluctuating since onset. Pertinent negatives include no inability to bear weight, loss of motion, loss of sensation, muscle weakness, numbness or tingling.      Review of Systems  Neurological: Negative for tingling and numbness.   fatigue, tingling easy bruising other 11 systems were normal     Objective:   Physical Exam  Nursing note and vitals reviewed. Constitutional: She is oriented to person, place, and time. She appears well-developed and well-nourished. No distress.  Musculoskeletal:  Lower extremity exam  Ambulation is normal.  The right and left lower extremity:  Inspection and palpation revealed no tenderness or abnormality in alignment in the lower extremities. Range of motion is full.  Strength is grade 5.  All joints are stable.      Neurological: She is alert and oriented to person, place, and time. She has normal reflexes. She exhibits normal muscle tone. Coordination normal.  Skin: Skin is warm and dry. She is not diaphoretic.  Psychiatric: She has a normal mood and affect. Her behavior is normal. Judgment and thought content normal.   lumbar spine she has no tenderness at the L1 fracture site is normal flexion extension of the spine shows no tenderness across the lower back or left SI joint her left gluteal area. Right side asymptomatic as  well        Assessment & Plan:  The x-rays of her spine showed lumbar scoliosis and degenerative disc disease L1 compression fracture.  Encounter Diagnosis  Name Primary?  . Back pain at L4-L5 level Yes   She may have injured the annulus of the L4-5 disc as well but this should be self-limiting as she is responding to aspirin and heat for  Followup as needed

## 2014-03-28 ENCOUNTER — Ambulatory Visit (HOSPITAL_COMMUNITY)
Admission: RE | Admit: 2014-03-28 | Discharge: 2014-03-28 | Disposition: A | Discharge status: Home / Self Care | Payer: Medicare Other | Source: Ambulatory Visit | Attending: Pulmonary Disease | Admitting: Pulmonary Disease

## 2014-03-28 ENCOUNTER — Other Ambulatory Visit (HOSPITAL_COMMUNITY): Payer: Self-pay | Admitting: Pulmonary Disease

## 2014-03-28 DIAGNOSIS — M545 Low back pain, unspecified: Secondary | ICD-10-CM

## 2014-12-06 ENCOUNTER — Ambulatory Visit (HOSPITAL_COMMUNITY): Payer: Self-pay

## 2014-12-06 ENCOUNTER — Other Ambulatory Visit (HOSPITAL_COMMUNITY): Payer: Self-pay | Admitting: Internal Medicine

## 2014-12-06 DIAGNOSIS — Z1231 Encounter for screening mammogram for malignant neoplasm of breast: Secondary | ICD-10-CM

## 2014-12-11 ENCOUNTER — Ambulatory Visit (HOSPITAL_COMMUNITY): Payer: Self-pay

## 2014-12-18 ENCOUNTER — Ambulatory Visit (HOSPITAL_COMMUNITY)
Admission: RE | Admit: 2014-12-18 | Discharge: 2014-12-18 | Disposition: A | Discharge status: Home / Self Care | Payer: Medicare Other | Source: Ambulatory Visit | Attending: Internal Medicine | Admitting: Internal Medicine

## 2014-12-18 DIAGNOSIS — Z1231 Encounter for screening mammogram for malignant neoplasm of breast: Secondary | ICD-10-CM

## 2016-07-21 ENCOUNTER — Other Ambulatory Visit (HOSPITAL_COMMUNITY): Payer: Self-pay | Admitting: Pulmonary Disease

## 2016-07-21 DIAGNOSIS — Z1231 Encounter for screening mammogram for malignant neoplasm of breast: Secondary | ICD-10-CM

## 2016-07-30 ENCOUNTER — Ambulatory Visit (HOSPITAL_COMMUNITY)
Admission: RE | Admit: 2016-07-30 | Discharge: 2016-07-30 | Disposition: A | Discharge status: Home / Self Care | Payer: Medicare Other | Source: Ambulatory Visit | Attending: Pulmonary Disease | Admitting: Pulmonary Disease

## 2016-07-30 DIAGNOSIS — Z1231 Encounter for screening mammogram for malignant neoplasm of breast: Secondary | ICD-10-CM | POA: Diagnosis present

## 2016-10-14 ENCOUNTER — Emergency Department (HOSPITAL_COMMUNITY): Payer: Medicare Other

## 2016-10-14 ENCOUNTER — Emergency Department (HOSPITAL_COMMUNITY)
Admission: EM | Admit: 2016-10-14 | Discharge: 2016-10-14 | Disposition: A | Discharge status: Home / Self Care | Payer: Medicare Other | Attending: Emergency Medicine | Admitting: Emergency Medicine

## 2016-10-14 ENCOUNTER — Encounter (HOSPITAL_COMMUNITY): Payer: Self-pay

## 2016-10-14 DIAGNOSIS — M25552 Pain in left hip: Secondary | ICD-10-CM

## 2016-10-14 DIAGNOSIS — R739 Hyperglycemia, unspecified: Secondary | ICD-10-CM

## 2016-10-14 DIAGNOSIS — R030 Elevated blood-pressure reading, without diagnosis of hypertension: Secondary | ICD-10-CM | POA: Insufficient documentation

## 2016-10-14 DIAGNOSIS — E1165 Type 2 diabetes mellitus with hyperglycemia: Secondary | ICD-10-CM | POA: Diagnosis not present

## 2016-10-14 DIAGNOSIS — Z87891 Personal history of nicotine dependence: Secondary | ICD-10-CM | POA: Diagnosis not present

## 2016-10-14 LAB — CBG MONITORING, ED: GLUCOSE-CAPILLARY: 217 mg/dL — AB (ref 65–99)

## 2016-10-14 MED ORDER — LIDOCAINE 5 % EX PTCH: MEDICATED_PATCH | CUTANEOUS | 0 refills | Status: DC

## 2016-10-14 MED ORDER — ACETAMINOPHEN 500 MG PO TABS: 1000.0000 mg | ORAL_TABLET | Freq: Once | ORAL | Status: DC

## 2016-10-14 NOTE — ED Triage Notes (Signed)
Pt c/o pain in left hip for the past few days.  Denies injury.  Reports pain started in r hip a few days ago then went away.

## 2016-10-14 NOTE — ED Provider Notes (Signed)
[facility] DEPT Provider Note   CSN: [id number] Arrival date & time: 10/14/16  0806     History   Chief Complaint Chief Complaint  Patient presents with  . Hip Pain    HPI Danielle [name] is a 83 y.o. female with a history of diabetes and osteopenia with prior lumbar compression fracture presenting with left hip pain which started several days ago.  She states he had pain in her right posterior buttock several weeks ago, it resolved and now she has pain in the left buttock and paralumbar area which started several days ago. She denies pain with rest and movement, only feeling it with ambulation.  There is no radiation of pain and she denies any falls or injury.  She also has no urinary frequency, retention, fevers, chills or abdominal pain or distention.  She has taken tylenol prior to arrival.  Additionally, notes that she indulged in pound cake, lemon pie and pizza last night at her daughters home and her cbg upon waking this am at 7 am was 302.  She took additional humalog and Toujeo per her pcps recommendation.    The history is provided by the patient and the spouse.    Past Medical History:  Diagnosis Date  . Diabetes mellitus without complication [facility])     Patient Active Problem List   Diagnosis Date Noted  . Back pain at L4-L5 level 06/14/2013  . Muscle weakness (generalized) 12/17/2012  . Pain in joint, shoulder region 12/17/2012  . Shoulder fracture 12/14/2012  . Proximal humerus fracture 11/29/2012  . FRACTURE, TOE, RIGHT 09/10/2009    Past Surgical History:  Procedure Laterality Date  . BREAST ENHANCEMENT SURGERY    . TUBAL LIGATION      OB History    No data available       Home Medications    Prior to Admission medications   Medication Sig Start Date End Date Taking? Authorizing Provider  acetaminophen (TYLENOL) 500 MG tablet Take 500 mg by mouth every 6 (six) hours as needed for pain.   Yes [provider]  aspirin EC 81 MG tablet  Take 81 mg by mouth daily.   Yes [provider]  insulin glargine (LANTUS) 100 UNIT/ML injection Inject 22 Units into the skin every morning.    Yes [provider]  insulin lispro (HUMALOG) 100 UNIT/ML injection Inject 2-4 Units into the skin 2 (two) times daily. Takes 4 units in the morning and 2 units in the evening.   Yes [provider]  levothyroxine (SYNTHROID, LEVOTHROID) 88 MCG tablet Take 88 mcg by mouth daily before breakfast.   Yes [provider]  Multiple Vitamin (MULTIVITAMIN WITH MINERALS) TABS Take 1 tablet by mouth daily.   Yes [provider]  ACCU-CHEK COMPACT PLUS test strip 1 strip by In Vitro route daily. 10/06/16   [provider]  HYDROcodone-acetaminophen (NORCO/VICODIN) 5-325 MG per tablet Take 1 tablet by mouth every 4 (four) hours as needed for pain. 11/27/12   Elnora Morrison, MD  lidocaine (LIDODERM) 5 % Remove & Discard patch within 12 hours or as directed by MD Apply to the site of pain. 10/14/16   Evalee Jefferson, PA-C    Family History No family history on file.  Social History Social History  Substance Use Topics  . Smoking status: Former Research scientist (life sciences)  . Smokeless tobacco: Never Used  . Alcohol use No     Allergies   Patient has no known allergies.   Review of  Systems Review of Systems  Constitutional: Negative for fever.  Gastrointestinal: Negative for abdominal pain, nausea and vomiting.  Endocrine: Negative for polydipsia and polyuria.  Genitourinary: Negative.   Musculoskeletal: Positive for arthralgias and joint swelling. Negative for myalgias.  Neurological: Negative for weakness and numbness.     Physical Exam Updated Vital Signs BP (!) 188/70 (BP Location: Right Arm)   Pulse 77   Temp 98 F (36.7 C) (Oral)   Resp 18   Ht 5\' 5"  (1.651 m)   Wt 54.9 kg (121 lb)   SpO2 96%   BMI 20.14 kg/m   Physical Exam  Constitutional: She appears well-developed and well-nourished.  HENT:  Head:  Atraumatic.  Neck: Normal range of motion.  Cardiovascular:  Pulses:      Popliteal pulses are 2+ on the right side, and 2+ on the left side.  Pulses equal bilaterally. No ankle edema.  Musculoskeletal: She exhibits tenderness.       Back:  ttp left mid buttock and along left posterior pelvic rim.  No lumbar ttp.  No increased pain with hip flex/ext, internal/ext rotation.   Neurological: She is alert. She has normal strength. She displays normal reflexes. No sensory deficit.  Skin: Skin is warm and dry.  Psychiatric: She has a normal mood and affect.     ED Treatments / Results  Labs (all labs ordered are listed, but only abnormal results are displayed) Labs Reviewed  CBG MONITORING, ED - Abnormal; Notable for the following:       Result Value   Glucose-Capillary 217 (*)    All other components within normal limits    EKG  EKG Interpretation None       Radiology Dg Lumbar Spine Complete  Result Date: 10/14/2016 CLINICAL DATA:  Three day history of low back pain without known injury. EXAM: LUMBAR SPINE - COMPLETE 4+ VIEW COMPARISON:  03/28/2014. FINDINGS: Bones are demineralized. Stable appearance L1 compression fracture. Loss of disc height noted at L2-3 and L4-5. Atherosclerotic calcification evident in the wall of the abdominal aorta. IMPRESSION: 1. Stable exam. Osteopenia with no interval change in compression deformity of L1. 2.  Abdominal Aortic Atherosclerois (ICD10-170.0) Electronically Signed   By: Misty Stanley M.D.   On: 10/14/2016 09:04   Dg Hip Unilat W Or Wo Pelvis 2-3 Views Left  Result Date: 10/14/2016 CLINICAL DATA:  Low back and posterior left hip pain for 3 days. No known injury. EXAM: DG HIP (WITH OR WITHOUT PELVIS) 2-3V LEFT COMPARISON:  None. FINDINGS: Frontal pelvis shows no fracture. SI joints are unremarkable. Symphysis pubis unremarkable. AP and frog-leg lateral views of the left hip show no femoral neck fracture. IMPRESSION: Negative. Electronically  Signed   By: Misty Stanley M.D.   On: 10/14/2016 09:03    Procedures Procedures (including critical care time)  Medications Ordered in ED Medications - No data to display   Initial Impression / Assessment and Plan / ED Course  I have reviewed the triage vital signs and the nursing notes.  Pertinent labs & imaging results that were available during my care of the patient were reviewed by me and considered in my medical decision making (see chart for details).     Pt with left hip pain with weight bearing, suspect muscular source, not triggered by ROM of hip joint.  Lumbar spine also nontender.  No concerning signs for abdominal source of pain.  Tylenol, lidoderm patches, heat. F/u with pcp within 1 week, also discussed bp and need  for repeat bp check. She denies sob, cp, headache, vision changes.  Final Clinical Impressions(s) / ED Diagnoses   Final diagnoses:  Left hip pain  Elevated blood pressure reading  Hyperglycemia    New Prescriptions New Prescriptions   LIDOCAINE (LIDODERM) 5 %    Remove & Discard patch within 12 hours or as directed by MD Apply to the site of pain.     Evalee Jefferson, PA-C 10/14/16 1043    Elnora Morrison, MD 10/14/16 1516    Elnora Morrison, MD 10/16/16 [phone]

## 2016-10-14 NOTE — Discharge Instructions (Signed)
You may try a heating pad applied 20 minutes several times daily to your hip and buttock area.  Also you may continue taking tylenol for pain relief.  Lidoderm patches can also help with pain and were prescribed for you.  A non prescription form of this is sold under the brand name Salon Pas.  You need to have your blood pressure rechecked this week as your repeat blood pressure is still elevated at 188/70.

## 2016-10-23 ENCOUNTER — Other Ambulatory Visit (HOSPITAL_COMMUNITY): Payer: Self-pay | Admitting: Pulmonary Disease

## 2016-10-23 DIAGNOSIS — M25552 Pain in left hip: Secondary | ICD-10-CM

## 2016-10-27 ENCOUNTER — Ambulatory Visit (HOSPITAL_COMMUNITY)
Admission: RE | Admit: 2016-10-27 | Discharge: 2016-10-27 | Disposition: A | Discharge status: Home / Self Care | Payer: Medicare Other | Source: Ambulatory Visit | Attending: Pulmonary Disease | Admitting: Pulmonary Disease

## 2016-10-27 DIAGNOSIS — M40202 Unspecified kyphosis, cervical region: Secondary | ICD-10-CM | POA: Diagnosis not present

## 2016-10-27 DIAGNOSIS — S32119A Unspecified Zone I fracture of sacrum, initial encounter for closed fracture: Secondary | ICD-10-CM | POA: Insufficient documentation

## 2016-10-27 DIAGNOSIS — M25552 Pain in left hip: Secondary | ICD-10-CM | POA: Insufficient documentation

## 2016-10-27 DIAGNOSIS — X58XXXA Exposure to other specified factors, initial encounter: Secondary | ICD-10-CM | POA: Insufficient documentation

## 2016-10-27 DIAGNOSIS — M47896 Other spondylosis, lumbar region: Secondary | ICD-10-CM | POA: Insufficient documentation

## 2016-10-27 DIAGNOSIS — M5136 Other intervertebral disc degeneration, lumbar region: Secondary | ICD-10-CM | POA: Diagnosis not present

## 2017-02-05 ENCOUNTER — Emergency Department (HOSPITAL_COMMUNITY): Payer: Medicare Other

## 2017-02-05 ENCOUNTER — Inpatient Hospital Stay (HOSPITAL_COMMUNITY): Payer: Medicare Other

## 2017-02-05 ENCOUNTER — Encounter (HOSPITAL_COMMUNITY): Payer: Self-pay | Admitting: Emergency Medicine

## 2017-02-05 ENCOUNTER — Inpatient Hospital Stay (HOSPITAL_COMMUNITY)
Admission: EM | Admit: 2017-02-05 | Discharge: 2017-02-08 | DRG: 184 | Disposition: A | Discharge status: Home Health | Payer: Medicare Other | Attending: Internal Medicine | Admitting: Internal Medicine

## 2017-02-05 DIAGNOSIS — J939 Pneumothorax, unspecified: Secondary | ICD-10-CM | POA: Diagnosis not present

## 2017-02-05 DIAGNOSIS — Z7982 Long term (current) use of aspirin: Secondary | ICD-10-CM | POA: Diagnosis not present

## 2017-02-05 DIAGNOSIS — B9629 Other Escherichia coli [E. coli] as the cause of diseases classified elsewhere: Secondary | ICD-10-CM | POA: Diagnosis not present

## 2017-02-05 DIAGNOSIS — S2241XA Multiple fractures of ribs, right side, initial encounter for closed fracture: Secondary | ICD-10-CM

## 2017-02-05 DIAGNOSIS — N390 Urinary tract infection, site not specified: Secondary | ICD-10-CM | POA: Diagnosis present

## 2017-02-05 DIAGNOSIS — S42001D Fracture of unspecified part of right clavicle, subsequent encounter for fracture with routine healing: Secondary | ICD-10-CM

## 2017-02-05 DIAGNOSIS — R030 Elevated blood-pressure reading, without diagnosis of hypertension: Secondary | ICD-10-CM | POA: Diagnosis present

## 2017-02-05 DIAGNOSIS — S27321A Contusion of lung, unilateral, initial encounter: Secondary | ICD-10-CM | POA: Diagnosis present

## 2017-02-05 DIAGNOSIS — M810 Age-related osteoporosis without current pathological fracture: Secondary | ICD-10-CM | POA: Diagnosis present

## 2017-02-05 DIAGNOSIS — J9383 Other pneumothorax: Secondary | ICD-10-CM | POA: Diagnosis not present

## 2017-02-05 DIAGNOSIS — E1121 Type 2 diabetes mellitus with diabetic nephropathy: Secondary | ICD-10-CM

## 2017-02-05 DIAGNOSIS — S2249XA Multiple fractures of ribs, unspecified side, initial encounter for closed fracture: Secondary | ICD-10-CM

## 2017-02-05 DIAGNOSIS — E118 Type 2 diabetes mellitus with unspecified complications: Secondary | ICD-10-CM | POA: Diagnosis not present

## 2017-02-05 DIAGNOSIS — Z87891 Personal history of nicotine dependence: Secondary | ICD-10-CM | POA: Diagnosis not present

## 2017-02-05 DIAGNOSIS — B9620 Unspecified Escherichia coli [E. coli] as the cause of diseases classified elsewhere: Secondary | ICD-10-CM | POA: Diagnosis present

## 2017-02-05 DIAGNOSIS — Z79899 Other long term (current) drug therapy: Secondary | ICD-10-CM | POA: Diagnosis not present

## 2017-02-05 DIAGNOSIS — S42001A Fracture of unspecified part of right clavicle, initial encounter for closed fracture: Secondary | ICD-10-CM

## 2017-02-05 DIAGNOSIS — W1830XA Fall on same level, unspecified, initial encounter: Secondary | ICD-10-CM | POA: Diagnosis not present

## 2017-02-05 DIAGNOSIS — Z1612 Extended spectrum beta lactamase (ESBL) resistance: Secondary | ICD-10-CM | POA: Diagnosis not present

## 2017-02-05 DIAGNOSIS — E1165 Type 2 diabetes mellitus with hyperglycemia: Secondary | ICD-10-CM | POA: Diagnosis not present

## 2017-02-05 DIAGNOSIS — S42021A Displaced fracture of shaft of right clavicle, initial encounter for closed fracture: Secondary | ICD-10-CM | POA: Diagnosis present

## 2017-02-05 DIAGNOSIS — E785 Hyperlipidemia, unspecified: Secondary | ICD-10-CM | POA: Diagnosis present

## 2017-02-05 DIAGNOSIS — E11649 Type 2 diabetes mellitus with hypoglycemia without coma: Secondary | ICD-10-CM | POA: Diagnosis present

## 2017-02-05 DIAGNOSIS — Z9882 Breast implant status: Secondary | ICD-10-CM

## 2017-02-05 DIAGNOSIS — S42009A Fracture of unspecified part of unspecified clavicle, initial encounter for closed fracture: Secondary | ICD-10-CM | POA: Diagnosis present

## 2017-02-05 DIAGNOSIS — Z794 Long term (current) use of insulin: Secondary | ICD-10-CM | POA: Diagnosis not present

## 2017-02-05 DIAGNOSIS — R079 Chest pain, unspecified: Secondary | ICD-10-CM | POA: Diagnosis present

## 2017-02-05 DIAGNOSIS — E784 Other hyperlipidemia: Secondary | ICD-10-CM | POA: Diagnosis not present

## 2017-02-05 DIAGNOSIS — Y92009 Unspecified place in unspecified non-institutional (private) residence as the place of occurrence of the external cause: Secondary | ICD-10-CM

## 2017-02-05 DIAGNOSIS — S270XXA Traumatic pneumothorax, initial encounter: Secondary | ICD-10-CM | POA: Diagnosis present

## 2017-02-05 DIAGNOSIS — IMO0002 Reserved for concepts with insufficient information to code with codable children: Secondary | ICD-10-CM

## 2017-02-05 HISTORY — DX: Hypothyroidism, unspecified: E03.9

## 2017-02-05 HISTORY — DX: Multiple fractures of ribs, unspecified side, initial encounter for closed fracture: S22.49XA

## 2017-02-05 HISTORY — DX: Pneumothorax, unspecified: J93.9

## 2017-02-05 LAB — BASIC METABOLIC PANEL
Anion gap: 13 (ref 5–15)
BUN: 19 mg/dL (ref 6–20)
CALCIUM: 9.5 mg/dL (ref 8.9–10.3)
CO2: 26 mmol/L (ref 22–32)
Chloride: 99 mmol/L — ABNORMAL LOW (ref 101–111)
Creatinine, Ser: 0.78 mg/dL (ref 0.44–1.00)
GFR calc Af Amer: >60 mL/min (ref >60)
Glucose, Bld: 93 mg/dL (ref 65–99)
POTASSIUM: 4.3 mmol/L (ref 3.5–5.1)
SODIUM: 138 mmol/L (ref 135–145)

## 2017-02-05 LAB — CBC WITH DIFFERENTIAL/PLATELET
BASOS ABS: 0 10*3/uL (ref 0.0–0.1)
BASOS PCT: 0 %
EOS ABS: 0.1 10*3/uL (ref 0.0–0.7)
EOS PCT: 1 %
HCT: 45.5 % (ref 36.0–46.0)
Hemoglobin: 15.6 g/dL — ABNORMAL HIGH (ref 12.0–15.0)
Lymphocytes Relative: 17 %
Lymphs Abs: 1.7 10*3/uL (ref 0.7–4.0)
MCH: 31.6 pg (ref 26.0–34.0)
MCHC: 34.3 g/dL (ref 30.0–36.0)
MCV: 92.3 fL (ref 78.0–100.0)
MONO ABS: 0.6 10*3/uL (ref 0.1–1.0)
Monocytes Relative: 6 %
Neutro Abs: 7.7 10*3/uL (ref 1.7–7.7)
Neutrophils Relative %: 76 %
PLATELETS: 226 10*3/uL (ref 150–400)
RBC: 4.93 MIL/uL (ref 3.87–5.11)
RDW: 14.2 % (ref 11.5–15.5)
WBC: 10 10*3/uL (ref 4.0–10.5)

## 2017-02-05 LAB — CBC
HCT: 37.1 % (ref 36.0–46.0)
HEMOGLOBIN: 12.7 g/dL (ref 12.0–15.0)
MCH: 31 pg (ref 26.0–34.0)
MCHC: 34.2 g/dL (ref 30.0–36.0)
MCV: 90.5 fL (ref 78.0–100.0)
PLATELETS: 206 10*3/uL (ref 150–400)
RBC: 4.1 MIL/uL (ref 3.87–5.11)
RDW: 14.1 % (ref 11.5–15.5)
WBC: 9.3 10*3/uL (ref 4.0–10.5)

## 2017-02-05 LAB — I-STAT TROPONIN, ED: Troponin i, poc: 0.02 ng/mL (ref 0.00–0.08)

## 2017-02-05 LAB — URINALYSIS, ROUTINE W REFLEX MICROSCOPIC
BILIRUBIN URINE: NEGATIVE
KETONES UR: 15 mg/dL — AB
NITRITE: POSITIVE — AB
PROTEIN: NEGATIVE mg/dL
Specific Gravity, Urine: 1.02 (ref 1.005–1.030)
pH: 7.5 (ref 5.0–8.0)

## 2017-02-05 LAB — GLUCOSE, CAPILLARY
GLUCOSE-CAPILLARY: 198 mg/dL — AB (ref 65–99)
Glucose-Capillary: 127 mg/dL — ABNORMAL HIGH (ref 65–99)
Glucose-Capillary: 155 mg/dL — ABNORMAL HIGH (ref 65–99)

## 2017-02-05 LAB — CBG MONITORING, ED
GLUCOSE-CAPILLARY: 84 mg/dL (ref 65–99)
Glucose-Capillary: 99 mg/dL (ref 65–99)

## 2017-02-05 LAB — BLOOD GAS, ARTERIAL
ACID-BASE DEFICIT: 2.4 mmol/L — AB (ref 0.0–2.0)
BICARBONATE: 22.8 mmol/L (ref 20.0–28.0)
DRAWN BY: 23534
O2 CONTENT: 2 L/min
O2 Saturation: 96.9 %
PH ART: 7.417 (ref 7.350–7.450)
PO2 ART: 91.5 mmHg (ref 83.0–108.0)
pCO2 arterial: 33.9 mmHg (ref 32.0–48.0)

## 2017-02-05 LAB — CREATININE, SERUM
CREATININE: 1.23 mg/dL — AB (ref 0.44–1.00)
GFR calc Af Amer: 46 mL/min — ABNORMAL LOW (ref >60)
GFR, EST NON AFRICAN AMERICAN: 40 mL/min — AB (ref >60)

## 2017-02-05 LAB — TROPONIN I

## 2017-02-05 LAB — MRSA PCR SCREENING: MRSA by PCR: NEGATIVE

## 2017-02-05 LAB — URINALYSIS, MICROSCOPIC (REFLEX)

## 2017-02-05 MED ORDER — FENTANYL CITRATE (PF) 100 MCG/2ML IJ SOLN
50.0000 ug | Freq: Once | INTRAMUSCULAR | Status: AC
  Administered 2017-02-05: 50 ug via INTRAVENOUS
  Filled 2017-02-05: qty 2

## 2017-02-05 MED ORDER — DEXTROSE 5 % IV SOLN
1.0000 g | INTRAVENOUS | Status: DC
  Administered 2017-02-05 – 2017-02-07 (×3): 1 g via INTRAVENOUS
  Filled 2017-02-05 (×4): qty 10

## 2017-02-05 MED ORDER — IOPAMIDOL (ISOVUE-300) INJECTION 61%
75.0000 mL | Freq: Once | INTRAVENOUS | Status: AC | PRN
  Administered 2017-02-05: 75 mL via INTRAVENOUS

## 2017-02-05 MED ORDER — CHLORHEXIDINE GLUCONATE CLOTH 2 % EX PADS
6.0000 | MEDICATED_PAD | Freq: Every day | CUTANEOUS | Status: DC
  Administered 2017-02-05 – 2017-02-07 (×3): 6 via TOPICAL

## 2017-02-05 MED ORDER — SODIUM CHLORIDE 0.9 % IV SOLN
INTRAVENOUS | Status: DC
  Administered 2017-02-06 – 2017-02-07 (×3): via INTRAVENOUS

## 2017-02-05 MED ORDER — INSULIN ASPART 100 UNIT/ML ~~LOC~~ SOLN
0.0000 [IU] | Freq: Three times a day (TID) | SUBCUTANEOUS | Status: DC
  Administered 2017-02-05 – 2017-02-06 (×3): 3 [IU] via SUBCUTANEOUS
  Administered 2017-02-07: 8 [IU] via SUBCUTANEOUS
  Administered 2017-02-07: 2 [IU] via SUBCUTANEOUS
  Administered 2017-02-07: 3 [IU] via SUBCUTANEOUS
  Administered 2017-02-08: 8 [IU] via SUBCUTANEOUS
  Administered 2017-02-08: 3 [IU] via SUBCUTANEOUS

## 2017-02-05 MED ORDER — ENOXAPARIN SODIUM 40 MG/0.4ML ~~LOC~~ SOLN
40.0000 mg | SUBCUTANEOUS | Status: DC
  Administered 2017-02-05 – 2017-02-08 (×4): 40 mg via SUBCUTANEOUS
  Filled 2017-02-05 (×4): qty 0.4

## 2017-02-05 MED ORDER — IBUPROFEN 800 MG PO TABS
ORAL_TABLET | ORAL | Status: AC
  Administered 2017-02-05: 800 mg
  Filled 2017-02-05: qty 1

## 2017-02-05 MED ORDER — MORPHINE SULFATE (PF) 2 MG/ML IV SOLN
2.0000 mg | INTRAVENOUS | Status: DC | PRN
  Administered 2017-02-05 – 2017-02-06 (×3): 2 mg via INTRAVENOUS
  Filled 2017-02-05 (×3): qty 1

## 2017-02-05 MED ORDER — LIDOCAINE 5 % EX PTCH
2.0000 | MEDICATED_PATCH | CUTANEOUS | Status: DC
  Administered 2017-02-05 – 2017-02-06 (×2): 1 via TRANSDERMAL
  Administered 2017-02-07 – 2017-02-08 (×2): 2 via TRANSDERMAL
  Filled 2017-02-05 (×5): qty 2

## 2017-02-05 MED ORDER — INSULIN ASPART 100 UNIT/ML ~~LOC~~ SOLN: 0.0000 [IU] | Freq: Every day | SUBCUTANEOUS | Status: DC

## 2017-02-05 MED ORDER — HYDRALAZINE HCL 20 MG/ML IJ SOLN
10.0000 mg | Freq: Four times a day (QID) | INTRAMUSCULAR | Status: DC | PRN
  Administered 2017-02-05 – 2017-02-08 (×4): 10 mg via INTRAVENOUS
  Filled 2017-02-05 (×4): qty 1

## 2017-02-05 MED ORDER — SODIUM CHLORIDE 0.9% FLUSH: 10.0000 mL | INTRAVENOUS | Status: DC | PRN

## 2017-02-05 MED ORDER — IBUPROFEN 200 MG PO TABS
800.0000 mg | ORAL_TABLET | Freq: Four times a day (QID) | ORAL | Status: DC
  Administered 2017-02-05 – 2017-02-08 (×12): 800 mg via ORAL
  Filled 2017-02-05 (×3): qty 4
  Filled 2017-02-05: qty 1
  Filled 2017-02-05 (×2): qty 4
  Filled 2017-02-05: qty 1
  Filled 2017-02-05: qty 4
  Filled 2017-02-05: qty 1
  Filled 2017-02-05 (×3): qty 4

## 2017-02-05 MED ORDER — ACETAMINOPHEN 500 MG PO TABS
1000.0000 mg | ORAL_TABLET | Freq: Four times a day (QID) | ORAL | Status: DC
  Administered 2017-02-05 – 2017-02-08 (×11): 1000 mg via ORAL
  Filled 2017-02-05 (×11): qty 2

## 2017-02-05 NOTE — H&P (Signed)
TRH H&P   Patient Demographics:    Danielle [name], is a 83 y.o. female  MRN: [id number]   DOB - 1932/03/27  Admit Date - 02/05/2017  Outpatient Primary MD for the patient is Sinda Du, MD  Referring MD:Dr. Rancour  Outpatient Specialists: None  Patient coming from: HOME  Chief Complaint  Patient presents with  . Fall      HPI:    Danielle [name]  is a 83 y.o. female, History of diabetes mellitus on insulin, low back pain presented from home with possibly a mechanical fall.  She landed on her right side. Patient informs getting up early this morning to go to the bathroom. She denies dizziness or lightheadedness or tripping on anything. She is not sure if she passed out . Husband heard a thump and woke up from sleep. He thinks she did not lose consciousness and was morning on the floor. Denies hitting her head on any object. He checked her blood glucose which was 60.  Patient denies any headache, blurred vision, chest pain, palpitations, shortness of breath, abdominal pain, bowel or urinary symptoms. Denies any new medications, use of benzos or narcotics. Denies recent illness. Patient had severe pain in right side of her chest and her shoulder. Patient brought to the ED where she was hypertensive with blood pressure of 202/81 mmHg. Sats were 99% on 2 L. Blood work was unremarkable. X-ray of the chest and right ribs showed multiple displaced fractures involving the right second to seventh rib. CT head and cervical spine were negative for any acute injury or bleed. Patient had a CT chest with contrast again showing displaced fractures involving right lateral third through eighth rib and right posterior second through seventh ribs. She also had right midclavicular angulated fracture. Also noted on chest CT was less than 10% right apical pneumothorax along with some lung  contusions around the rib fractures. Her UA was positive for UTI. Patient given 50 mg IV fentanyl and hospitalist consulted for admission. General surgery consulted as well.   Review of systems:    In addition to the HPI above,  No Fever-chills, No Headache, No changes with Vision or hearing, No problems swallowing food or Liquids, No Chest pain, Cough or Shortness of Breath, No Abdominal pain, No Nausea or Vommitting, Bowel movements are regular, No Blood in stool or Urine, No dysuria, No new skin rashes or bruises, No new joints pains-aches,  No new weakness, tingling, numbness in any extremity, No recent weight gain or loss, No polyuria, polydypsia or polyphagia, No significant Mental Stressors.  A full 10 point Review of Systems was done, except as stated above, all other Review of Systems were negative.   With Past History of the following :    Past Medical History:  Diagnosis Date  . Diabetes mellitus without complication [facility])       Past Surgical  History:  Procedure Laterality Date  . BREAST ENHANCEMENT SURGERY    . TUBAL LIGATION        Social History:     Social History  Substance Use Topics  . Smoking status: Former Research scientist (life sciences)  . Smokeless tobacco: Never Used  . Alcohol use No     Lives - At home with husband  Mobility -independent   Family History :   No Family history of heart disease, diabetes or stroke.   Home Medications:   Prior to Admission medications   Medication Sig Start Date End Date Taking? Authorizing Provider  ACCU-CHEK COMPACT PLUS test strip 1 strip by In Vitro route daily. 10/06/16   [provider]  acetaminophen (TYLENOL) 500 MG tablet Take 500 mg by mouth every 6 (six) hours as needed for pain.    [provider]  alendronate (FOSAMAX) 70 MG tablet Take 70 mg by mouth every 7 (seven) days. 11/04/16   [provider]  aspirin EC 81 MG tablet Take 81 mg by mouth daily.    [provider]    HYDROcodone-acetaminophen (NORCO/VICODIN) 5-325 MG per tablet Take 1 tablet by mouth every 4 (four) hours as needed for pain. 11/27/12   Elnora Morrison, MD  insulin glargine (LANTUS) 100 UNIT/ML injection Inject 22 Units into the skin every morning.     [provider]  insulin lispro (HUMALOG) 100 UNIT/ML injection Inject 2-4 Units into the skin 2 (two) times daily. Takes 4 units in the morning and 2 units in the evening.    [provider]  levothyroxine (SYNTHROID, LEVOTHROID) 88 MCG tablet Take 88 mcg by mouth daily before breakfast.    [provider]  lidocaine (LIDODERM) 5 % Remove & Discard patch within 12 hours or as directed by MD Apply to the site of pain. 10/14/16   Evalee Jefferson, PA-C  Multiple Vitamin (MULTIVITAMIN WITH MINERALS) TABS Take 1 tablet by mouth daily.    [provider]     Allergies:    No Known Allergies   Physical Exam:   Vitals  Blood pressure (!) 205/73, pulse 95, resp. rate 18, height 5\' 5"  (1.651 m), weight 53.5 kg (118 lb), SpO2 98 %.    General : Elderly thin built female lying in bed appears in discomfort with pain. HEENT: No pallor, moist oral mucosa, supple neck Chest: Prominent right clavicle, limited mobility due to severe pain, bruising on the right side of the chest, diminished bilateral breath sounds with inability to take deep inspiration, no crackles, rhonchi or wheeze CVS: Normal S1 and S2, no murmurs or gallop GI: Soft, nondistended, nontender, bowel sounds present Musculoskeletal: Warm, no edema CNS: Alert and oriented      Data Review:    CBC  Recent Labs Lab 02/05/17 0619  WBC 10.0  HGB 15.6*  HCT 45.5  PLT 226  MCV 92.3  MCH 31.6  MCHC 34.3  RDW 14.2  LYMPHSABS 1.7  MONOABS 0.6  EOSABS 0.1  BASOSABS 0.0   ------------------------------------------------------------------------------------------------------------------  Chemistries   Recent Labs Lab 02/05/17 0619  NA 138  K  4.3  CL 99*  CO2 26  GLUCOSE 93  BUN 19  CREATININE 0.78  CALCIUM 9.5   ------------------------------------------------------------------------------------------------------------------ estimated creatinine clearance is 45.8 mL/min (by C-G formula based on SCr of 0.78 mg/dL). ------------------------------------------------------------------------------------------------------------------ No results for input(s): TSH, T4TOTAL, T3FREE, THYROIDAB in the last 72 hours.  Invalid input(s): FREET3  Coagulation profile No results for input(s): INR, PROTIME in the last  168 hours. ------------------------------------------------------------------------------------------------------------------- No results for input(s): DDIMER in the last 72 hours. -------------------------------------------------------------------------------------------------------------------  Cardiac Enzymes  Recent Labs Lab 02/05/17 0619  TROPONINI <0.03   ------------------------------------------------------------------------------------------------------------------ No results found for: BNP   ---------------------------------------------------------------------------------------------------------------  Urinalysis    Component Value Date/Time   COLORURINE YELLOW 02/05/2017 [place] 02/05/2017 0620   LABSPEC 1.020 02/05/2017 0620   PHURINE 7.5 02/05/2017 0620   GLUCOSEU >=500 (A) 02/05/2017 0620   HGBUR MODERATE (A) 02/05/2017 0620   BILIRUBINUR NEGATIVE 02/05/2017 0620   KETONESUR 15 (A) 02/05/2017 0620   PROTEINUR NEGATIVE 02/05/2017 0620   NITRITE POSITIVE (A) 02/05/2017 0620   LEUKOCYTESUR TRACE (A) 02/05/2017 0620    ----------------------------------------------------------------------------------------------------------------   Imaging Results:    Dg Ribs Unilateral W/chest Right  Result Date: 02/05/2017 CLINICAL DATA:  Right chest pain after a fall. EXAM: RIGHT RIBS AND  CHEST - 3+ VIEW COMPARISON:  Chest 04/04/2013 FINDINGS: Normal heart size and pulmonary vascularity. No airspace disease or consolidation in the lungs. No blunting of costophrenic angles. Tiny right apical pneumothorax. Bilateral breast implants. Calcification of the aorta. Thoracic scoliosis convex towards the left. Right ribs demonstrate acute displaced fractures involving the lateral second, third, fourth, fifth, sixth, and seventh ribs. No expansile or destructive bone lesions. IMPRESSION: Multiple acute displaced right rib fractures with small right pneumothorax. These results were called by telephone at the time of interpretation on 02/05/2017 at 6:50 am to Dr. Ezequiel Essex , who verbally acknowledged these results. Electronically Signed   By: Lucienne Capers M.D.   On: 02/05/2017 06:52   Dg Clavicle Right  Result Date: 02/05/2017 CLINICAL DATA:  [place] this morning. Pain and swelling right clavicle. Right chest pain. EXAM: RIGHT CLAVICLE - 2+ VIEWS COMPARISON:  None. FINDINGS: Right clavicle appears intact. No evidence of acute fracture or dislocation. Coracoclavicular and acromioclavicular spaces are maintained. Multiple right rib fractures are demonstrated. Small right pneumothorax. IMPRESSION: Right clavicle appears intact. Multiple right rib fractures. Small right pneumothorax. See additional report of chest and right ribs. Electronically Signed   By: Lucienne Capers M.D.   On: 02/05/2017 06:48   Ct Head Wo Contrast  Result Date: 02/05/2017 CLINICAL DATA:  Fall.  Head injury EXAM: CT HEAD WITHOUT CONTRAST CT CERVICAL SPINE WITHOUT CONTRAST TECHNIQUE: Multidetector CT imaging of the head and cervical spine was performed following the standard protocol without intravenous contrast. Multiplanar CT image reconstructions of the cervical spine were also generated. COMPARISON:  None. FINDINGS: CT HEAD FINDINGS Brain: Ventricle size and cerebral volume normal for age. Negative for infarct, hemorrhage,  or mass lesion. Vascular: Negative for hyperdense vessel Skull: Negative Sinuses/Orbits: Negative Other: None CT CERVICAL SPINE FINDINGS Alignment: Mild retrolisthesis at C3-4. 3 mm anterolisthesis C4-5. Mild retrolisthesis C6-7 Skull base and vertebrae: Negative for fracture Soft tissues and spinal canal: No soft tissue mass. Extensive carotid artery calcification bilaterally Disc levels: Multilevel disc and facet degeneration. Partial fusion at C5-6 which may be congenital. Foraminal encroachment bilaterally due to spurring at C3-4, C4-5, C6-7. Upper chest: Negative Other: None IMPRESSION: Negative CT head Advanced cervical degenerative changes without fracture. Electronically Signed   By: Franchot Gallo M.D.   On: 02/05/2017 07:21   Ct Chest W Contrast  Result Date: 02/05/2017 CLINICAL DATA:  Fall getting up this morning EXAM: CT CHEST WITH CONTRAST TECHNIQUE: Multidetector CT imaging of the chest was performed during intravenous contrast administration. CONTRAST:  53mL ISOVUE-300 IOPAMIDOL (ISOVUE-300) INJECTION 61% COMPARISON:  None. FINDINGS: Cardiovascular: Normal heart size. No pericardial effusion.  No evidence of great vessel injury. Atherosclerotic calcification of the aorta and coronaries. Aortic valve calcification. Mediastinum/Nodes: Negative for hematoma or pneumomediastinum. Negative for adenopathy or mass. Lungs/Pleura: There is a small right pneumothorax, less than 10%. Trace right pleural effusion that is water density. At least 2 subpleural ground-glass densities in the right upper lobe along rib fractures, likely mild contusion. Mild bilateral atelectatic change. There is biapical pleural based scarring, more prominent on the right due to the neighboring pneumothorax. Subpleural density along the lower right major fissure has a flat appearance, favoring atelectasis over a nodule. Upper Abdomen: No evidence of injury. Atherosclerotic calcifications. Musculoskeletal: Mid right clavicle fracture  with mild apex ventral angulation. Neighboring soft tissue hematoma. Lateral right third, fourth, fifth, sixth, seventh, and eighth rib fractures. Posterior right third and seventh rib fractures. No left-sided rib fractures noted. Remote appearing L1 compression fracture with advanced height loss anteriorly and mild retropulsion. Bilateral breast implants which are intact. IMPRESSION: 1. Small right pneumothorax, less than 10%. Trace right pleural effusion that is low-density. Few small pulmonary contusions in the right upper lobe deep to rib fractures. 2. Right lateral third through eighth rib fractures. Posterior right third and seventh rib fractures. 3. Mildly angulated right mid clavicle fracture. 4. Aortic Atherosclerosis (ICD10-I70.0).  Coronary atherosclerosis. Electronically Signed   By: Monte Fantasia M.D.   On: 02/05/2017 07:30   Ct Cervical Spine Wo Contrast  Result Date: 02/05/2017 CLINICAL DATA:  Fall.  Head injury EXAM: CT HEAD WITHOUT CONTRAST CT CERVICAL SPINE WITHOUT CONTRAST TECHNIQUE: Multidetector CT imaging of the head and cervical spine was performed following the standard protocol without intravenous contrast. Multiplanar CT image reconstructions of the cervical spine were also generated. COMPARISON:  None. FINDINGS: CT HEAD FINDINGS Brain: Ventricle size and cerebral volume normal for age. Negative for infarct, hemorrhage, or mass lesion. Vascular: Negative for hyperdense vessel Skull: Negative Sinuses/Orbits: Negative Other: None CT CERVICAL SPINE FINDINGS Alignment: Mild retrolisthesis at C3-4. 3 mm anterolisthesis C4-5. Mild retrolisthesis C6-7 Skull base and vertebrae: Negative for fracture Soft tissues and spinal canal: No soft tissue mass. Extensive carotid artery calcification bilaterally Disc levels: Multilevel disc and facet degeneration. Partial fusion at C5-6 which may be congenital. Foraminal encroachment bilaterally due to spurring at C3-4, C4-5, C6-7. Upper chest: Negative  Other: None IMPRESSION: Negative CT head Advanced cervical degenerative changes without fracture. Electronically Signed   By: Franchot Gallo M.D.   On: 02/05/2017 07:21    My personal review of EKG: Sinus rhythm at 89 with PVCs, LVH.   Assessment & Plan:    Principal Problem:   Multiple rib fractures Admit to ICU at [facility] given high risk for intubation. Pulmonary will evaluate the patient and if stable can downgrade her to stepdown level of care. Consult trauma surgery once patient arrives at [facility]. -Pain control with IV morphine 2 mg every 2 hours as needed and ibuprofen. Continue O2 venous cannula. Check ABG. -She was only able to put out 350cc on incentive spirometry. Discussed with general surgery Dr. Constance Haw who suggests patient is at very high risk for intubation. --Continue extensive pulmonary toilet. -Keep nothing by mouth for now.  Active Problems: Right apical pneumothorax Secondary to trauma with a fracture. Continue O2 via nasal cannula. Pulmonary and trauma surgery evaluation.    Type 2 diabetes mellitus with hypoglycemia without coma ([place]) Patient hypoglycemic this morning. Hold her home insulin and monitor on sliding scale coverage only. Check A1c.  Right clavicle fracture. Continue pain control.  Elevated blood pressure reading Likely contributed by trauma. Will place on when necessary IV hydralazine.    UTI (urinary tract infection) Urine culture sent. Will place on empiric IV Rocephin.     DVT Prophylaxis : Subcutaneous Lovenox  AM Labs Ordered, also please review Full Orders  Family Communication: Admission, patients condition and plan of care including tests being ordered have been discussed with the patient and her husband at bedside  Code Status full code  Transferred to [facility] ICU  Condition GUARDED   Consults called:  PC CM (Dr. Titus Mould) Trauma surgery (Dr. Hulen Skains)    Admission status: Inpatient  Time spent in minutes :  70   Louellen Molder M.D on 02/05/2017 at 8:51 AM  Between 7am to 7pm - Pager - [phone]. After 7pm go to [email or web address] - password [facility]  [facility] - Office  [phone]

## 2017-02-05 NOTE — ED Provider Notes (Addendum)
[facility] DEPT Provider Note   CSN: [id number] Arrival date & time: 02/05/17  0601     History   Chief Complaint Chief Complaint  Patient presents with  . Fall    HPI Danielle [name] is a 83 y.o. female.  Patient presents with R clavicle and rib pain after fall this morning.  Patient states she was getting out of bed to go to the bathroom and somehow fell. She does not remember the fall. Husband states he heard her fall and when she was unable to get up. Patient denies any head or neck pain. She complains of pain to her right clavicle and right ribs with shortness of breath. She is not anticoagulated. She feels that she cannot take a deep breath because of pain. She denies any preceding dizziness or lightheadedness. She is a diabetic and her initial blood sugar was apparently 60 but is now 99. No back pain. No pain with range of motion of her hips.    Fall  Pertinent negatives include no chest pain, no abdominal pain, no headaches and no shortness of breath.    Past Medical History:  Diagnosis Date  . Diabetes mellitus without complication [facility])     Patient Active Problem List   Diagnosis Date Noted  . Back pain at L4-L5 level 06/14/2013  . Muscle weakness (generalized) 12/17/2012  . Pain in joint, shoulder region 12/17/2012  . Shoulder fracture 12/14/2012  . Proximal humerus fracture 11/29/2012  . FRACTURE, TOE, RIGHT 09/10/2009    Past Surgical History:  Procedure Laterality Date  . BREAST ENHANCEMENT SURGERY    . TUBAL LIGATION      OB History    No data available       Home Medications    Prior to Admission medications   Medication Sig Start Date End Date Taking? Authorizing Provider  ACCU-CHEK COMPACT PLUS test strip 1 strip by In Vitro route daily. 10/06/16   [provider]  acetaminophen (TYLENOL) 500 MG tablet Take 500 mg by mouth every 6 (six) hours as needed for pain.    [provider]  aspirin EC 81 MG tablet Take 81 mg  by mouth daily.    [provider]  HYDROcodone-acetaminophen (NORCO/VICODIN) 5-325 MG per tablet Take 1 tablet by mouth every 4 (four) hours as needed for pain. 11/27/12   Elnora Morrison, MD  insulin glargine (LANTUS) 100 UNIT/ML injection Inject 22 Units into the skin every morning.     [provider]  insulin lispro (HUMALOG) 100 UNIT/ML injection Inject 2-4 Units into the skin 2 (two) times daily. Takes 4 units in the morning and 2 units in the evening.    [provider]  levothyroxine (SYNTHROID, LEVOTHROID) 88 MCG tablet Take 88 mcg by mouth daily before breakfast.    [provider]  lidocaine (LIDODERM) 5 % Remove & Discard patch within 12 hours or as directed by MD Apply to the site of pain. 10/14/16   Evalee Jefferson, PA-C  Multiple Vitamin (MULTIVITAMIN WITH MINERALS) TABS Take 1 tablet by mouth daily.    [provider]    Family History No family history on file.  Social History Social History  Substance Use Topics  . Smoking status: Former Research scientist (life sciences)  . Smokeless tobacco: Never Used  . Alcohol use No     Allergies   Patient has no known allergies.   Review of Systems Review of Systems  Constitutional: Negative for activity change, appetite change and fever.  HENT:  Negative for congestion and rhinorrhea.   Eyes: Negative for visual disturbance.  Respiratory: Negative for chest tightness, shortness of breath and wheezing.   Cardiovascular: Negative for chest pain.  Gastrointestinal: Negative for abdominal pain, nausea and vomiting.  Genitourinary: Negative for dysuria, hematuria, vaginal bleeding and vaginal discharge.  Musculoskeletal: Positive for arthralgias, back pain and myalgias. Negative for neck pain.  Neurological: Negative for dizziness, weakness and headaches.    all other systems are negative except as noted in the HPI and PMH.    Physical Exam Updated Vital Signs BP (!) 221/92 (BP Location: Left Arm)   Pulse 97    Resp 11   Ht 5\' 5"  (1.651 m)   Wt 53.5 kg (118 lb)   SpO2 99%   BMI 19.64 kg/m   Physical Exam  Constitutional: She is oriented to person, place, and time. She appears well-developed and well-nourished. She appears distressed.  uncomfortable  HENT:  Head: Normocephalic and atraumatic.  Mouth/Throat: Oropharynx is clear and moist. No oropharyngeal exudate.  Eyes: Pupils are equal, round, and reactive to light. Conjunctivae and EOM are normal.  Neck: Normal range of motion. Neck supple.  No C spine tenderness  Cardiovascular: Normal rate, regular rhythm, normal heart sounds and intact distal pulses.   No murmur heard. Pulmonary/Chest: Effort normal and breath sounds normal. No respiratory distress. She exhibits tenderness.  Edema and ecchymosis to mid clavicle. Diffuse tenderness to right lateral ribs without crepitance or ecchymosis. Equal breath sounds bilaterally  Abdominal: Soft. There is no tenderness. There is no rebound and no guarding.  Musculoskeletal: Normal range of motion. She exhibits no edema or tenderness.  Full Range of motion of hips without pain  Neurological: She is alert and oriented to person, place, and time. No cranial nerve deficit. She exhibits normal muscle tone. Coordination normal.   5/5 strength throughout. CN 2-12 intact.Equal grip strength.   Skin: Skin is warm. Capillary refill takes less than 2 seconds.  Psychiatric: She has a normal mood and affect. Her behavior is normal.  Nursing note and vitals reviewed.    ED Treatments / Results  Labs (all labs ordered are listed, but only abnormal results are displayed) Labs Reviewed  CBC WITH DIFFERENTIAL/PLATELET - Abnormal; Notable for the following:       Result Value   Hemoglobin 15.6 (*)    All other components within normal limits  BASIC METABOLIC PANEL - Abnormal; Notable for the following:    Chloride 99 (*)    All other components within normal limits  TROPONIN I  URINALYSIS, ROUTINE W  REFLEX MICROSCOPIC  CBG MONITORING, ED  I-STAT TROPONIN, ED    EKG  EKG Interpretation  Date/Time:  Thursday February 05 2017 06:12:19 EDT Ventricular Rate:  89 PR Interval:    QRS Duration: 132 QT Interval:  399 QTC Calculation: 486 R Axis:   -55 Text Interpretation:  Sinus rhythm Multiple ventricular premature complexes Prolonged PR interval Biatrial enlargement LVH with IVCD, LAD and secondary repol abnrm No significant change was found Confirmed by Ezequiel Essex [phone]) on 02/05/2017 6:18:52 AM       Radiology Dg Ribs Unilateral W/chest Right  Result Date: 02/05/2017 CLINICAL DATA:  Right chest pain after a fall. EXAM: RIGHT RIBS AND CHEST - 3+ VIEW COMPARISON:  Chest 04/04/2013 FINDINGS: Normal heart size and pulmonary vascularity. No airspace disease or consolidation in the lungs. No blunting of costophrenic angles. Tiny right apical pneumothorax. Bilateral breast implants. Calcification of the aorta. Thoracic scoliosis convex towards  the left. Right ribs demonstrate acute displaced fractures involving the lateral second, third, fourth, fifth, sixth, and seventh ribs. No expansile or destructive bone lesions. IMPRESSION: Multiple acute displaced right rib fractures with small right pneumothorax. These results were called by telephone at the time of interpretation on 02/05/2017 at 6:50 am to Dr. Ezequiel Essex , who verbally acknowledged these results. Electronically Signed   By: Lucienne Capers M.D.   On: 02/05/2017 06:52   Dg Clavicle Right  Result Date: 02/05/2017 CLINICAL DATA:  [place] this morning. Pain and swelling right clavicle. Right chest pain. EXAM: RIGHT CLAVICLE - 2+ VIEWS COMPARISON:  None. FINDINGS: Right clavicle appears intact. No evidence of acute fracture or dislocation. Coracoclavicular and acromioclavicular spaces are maintained. Multiple right rib fractures are demonstrated. Small right pneumothorax. IMPRESSION: Right clavicle appears intact. Multiple right rib  fractures. Small right pneumothorax. See additional report of chest and right ribs. Electronically Signed   By: Lucienne Capers M.D.   On: 02/05/2017 06:48    Procedures Procedures (including critical care time)  Medications Ordered in ED Medications  iopamidol (ISOVUE-300) 61 % injection 75 mL (not administered)     Initial Impression / Assessment and Plan / ED Course  I have reviewed the triage vital signs and the nursing notes.  Pertinent labs & imaging results that were available during my care of the patient were reviewed by me and considered in my medical decision making (see chart for details).     Patient sustained mechanical fall while trying to get out of bed and has right clavicle and right sided rib pain. She denies any head or neck pain. She is not anticoagulated.  Imaging is remarkable for small apical pneumothorax with multiple right-sided rib fractures.  Discussed with Dr. Constance Haw of general surgery who will consult. Recommends ICU care and aggressive pulmonary toilet and to clarify patient's wishes for intubation if necessary.  Patient is in no respiratory distress.Oxygenation saturation is 99% on 2 L. She states she would not want intubation if necessary.  Her pain is controlled with IV fentanyl. CT head and C-spine are negative. CT chest pending. Sling placed for likely R clavicle fracture despite negative Xrays.   Admission discussed with Dr. Clementeen Graham.   CRITICAL CARE Performed by: Ezequiel Essex Total critical care time: 35 minutes Critical care time was exclusive of separately billable procedures and treating other patients. Critical care was necessary to treat or prevent imminent or life-threatening deterioration. Critical care was time spent personally by me on the following activities: development of treatment plan with patient and/or surrogate as well as nursing, discussions with consultants, evaluation of patient's response to treatment, examination  of patient, obtaining history from patient or surrogate, ordering and performing treatments and interventions, ordering and review of laboratory studies, ordering and review of radiographic studies, pulse oximetry and re-evaluation of patient's condition.   Final Clinical Impressions(s) / ED Diagnoses   Final diagnoses:  Closed fracture of multiple ribs of right side, initial encounter  Closed nondisplaced fracture of right clavicle, unspecified part of clavicle, initial encounter  Traumatic pneumothorax, initial encounter    New Prescriptions New Prescriptions   No medications on file     Ezequiel Essex, MD 02/05/17 [id number]    Ezequiel Essex, MD 02/05/17 [phone]

## 2017-02-05 NOTE — Consult Note (Signed)
[facility]  Reason for Consult: Trauma, fall with multiple rib fractures and apical pneumothorax  Referring Physician:  Dr. Marlana Salvage and Dr. Simone Curia is an 83 y.o. female.  HPI: Ms. Zhao is a pleasant lady with DM and osteoporosis who fell out of bed this AM at about 450AM. It was dark and she is unsure if this was a mechanical fall or if she passed out.  She does not know if she hit her head, and her husband heard the noise and was awakened and went immediately to her.  She was having right sided chest pain and she could not get up, so they called EMS to bring her to the hospital.    On my assessment, she is hurting on the right side and reports that she feels like she cannot take in a deep breath. She denies any pain elsewhere.    Past Medical History:  Diagnosis Date  . Diabetes mellitus without complication [facility])     Past Surgical History:  Procedure Laterality Date  . BREAST ENHANCEMENT SURGERY    . TUBAL LIGATION      Family history: No pertinent to the current traumatic episode   Social History:  reports that she has quit smoking. She has never used smokeless tobacco. She reports that she does not drink alcohol or use drugs.  Allergies:  Allergies  Allergen Reactions  . Fosamax [Alendronate Sodium]     Medications:  No current facility-administered medications on file prior to encounter.    Current Outpatient Prescriptions on File Prior to Encounter  Medication Sig Dispense Refill  . ACCU-CHEK COMPACT PLUS test strip 1 strip by In Vitro route daily.    Marland Kitchen acetaminophen (TYLENOL) 500 MG tablet Take 500 mg by mouth every 6 (six) hours as needed for pain.    Marland Kitchen aspirin EC 81 MG tablet Take 81 mg by mouth daily.    Marland Kitchen HYDROcodone-acetaminophen (NORCO/VICODIN) 5-325 MG per tablet Take 1 tablet by mouth every 4 (four) hours as needed for pain. 8 tablet 0  . insulin glargine (LANTUS) 100 UNIT/ML injection Inject 22 Units into the skin  every morning.     . insulin lispro (HUMALOG) 100 UNIT/ML injection Inject 2-4 Units into the skin 2 (two) times daily. Takes 4 units in the morning and 2 units in the evening.    Marland Kitchen levothyroxine (SYNTHROID, LEVOTHROID) 88 MCG tablet Take 88 mcg by mouth daily before breakfast.    . lidocaine (LIDODERM) 5 % Remove & Discard patch within 12 hours or as directed by MD Apply to the site of pain. 30 patch 0  . Multiple Vitamin (MULTIVITAMIN WITH MINERALS) TABS Take 1 tablet by mouth daily.        Results for orders placed or performed during the hospital encounter of 02/05/17 (from the past 48 hour(s))  CBG monitoring, ED     Status: None   Collection Time: 02/05/17  6:06 AM  Result Value Ref Range   Glucose-Capillary 99 65 - 99 mg/dL   Comment 1 Notify RN    Comment 2 Document in Chart   I-Stat Troponin, ED (not at [facility])     Status: None   Collection Time: 02/05/17  6:18 AM  Result Value Ref Range   Troponin i, poc 0.02 0.00 - 0.08 ng/mL   Comment 3            Comment: Due to the release kinetics of cTnI, a negative result within the first  hours of the onset of symptoms does not rule out myocardial infarction with certainty. If myocardial infarction is still suspected, repeat the test at appropriate intervals.   CBC with Differential/Platelet     Status: Abnormal   Collection Time: 02/05/17  6:19 AM  Result Value Ref Range   WBC 10.0 4.0 - 10.5 K/uL   RBC 4.93 3.87 - 5.11 MIL/uL   Hemoglobin 15.6 (H) 12.0 - 15.0 g/dL   HCT 45.5 36.0 - 46.0 %   MCV 92.3 78.0 - 100.0 fL   MCH 31.6 26.0 - 34.0 pg   MCHC 34.3 30.0 - 36.0 g/dL   RDW 14.2 11.5 - 15.5 %   Platelets 226 150 - 400 K/uL   Neutrophils Relative % 76 %   Neutro Abs 7.7 1.7 - 7.7 K/uL   Lymphocytes Relative 17 %   Lymphs Abs 1.7 0.7 - 4.0 K/uL   Monocytes Relative 6 %   Monocytes Absolute 0.6 0.1 - 1.0 K/uL   Eosinophils Relative 1 %   Eosinophils Absolute 0.1 0.0 - 0.7 K/uL   Basophils Relative 0 %   Basophils  Absolute 0.0 0.0 - 0.1 K/uL  Basic metabolic panel     Status: Abnormal   Collection Time: 02/05/17  6:19 AM  Result Value Ref Range   Sodium 138 135 - 145 mmol/L   Potassium 4.3 3.5 - 5.1 mmol/L   Chloride 99 (L) 101 - 111 mmol/L   CO2 26 22 - 32 mmol/L   Glucose, Bld 93 65 - 99 mg/dL   BUN 19 6 - 20 mg/dL   Creatinine, Ser 0.78 0.44 - 1.00 mg/dL   Calcium 9.5 8.9 - 10.3 mg/dL   GFR calc non Af Amer >60 >60 mL/min   GFR calc Af Amer >60 >60 mL/min    Comment: (NOTE) The eGFR has been calculated using the CKD EPI equation. This calculation has not been validated in all clinical situations. eGFR's persistently <60 mL/min signify possible Chronic Kidney Disease.    Anion gap 13 5 - 15  Troponin I     Status: None   Collection Time: 02/05/17  6:19 AM  Result Value Ref Range   Troponin I <0.03 <0.03 ng/mL  Urinalysis, Routine w reflex microscopic     Status: Abnormal   Collection Time: 02/05/17  6:20 AM  Result Value Ref Range   Color, Urine YELLOW YELLOW   APPearance CLEAR CLEAR   Specific Gravity, Urine 1.020 1.005 - 1.030   pH 7.5 5.0 - 8.0   Glucose, UA >=500 (A) NEGATIVE mg/dL   Hgb urine dipstick MODERATE (A) NEGATIVE   Bilirubin Urine NEGATIVE NEGATIVE   Ketones, ur 15 (A) NEGATIVE mg/dL   Protein, ur NEGATIVE NEGATIVE mg/dL   Nitrite POSITIVE (A) NEGATIVE   Leukocytes, UA TRACE (A) NEGATIVE  Urinalysis, Microscopic (reflex)     Status: Abnormal   Collection Time: 02/05/17  6:20 AM  Result Value Ref Range   RBC / HPF TOO NUMEROUS TO COUNT 0 - 5 RBC/hpf   WBC, UA 0-5 0 - 5 WBC/hpf   Bacteria, UA MANY (A) NONE SEEN   Squamous Epithelial / LPF 0-5 (A) NONE SEEN  CBG monitoring, ED     Status: None   Collection Time: 02/05/17  7:36 AM  Result Value Ref Range   Glucose-Capillary 84 65 - 99 mg/dL   Personally reviewed imaging- 6 lateral rib fractures on right and 2 posterior, small apical pneumothorax, right clavicle fracture, no acute head  bleed or spinal fracture    Dg Ribs Unilateral W/chest Right  Result Date: 02/05/2017 CLINICAL DATA:  Right chest pain after a fall. EXAM: RIGHT RIBS AND CHEST - 3+ VIEW COMPARISON:  Chest 04/04/2013 FINDINGS: Normal heart size and pulmonary vascularity. No airspace disease or consolidation in the lungs. No blunting of costophrenic angles. Tiny right apical pneumothorax. Bilateral breast implants. Calcification of the aorta. Thoracic scoliosis convex towards the left. Right ribs demonstrate acute displaced fractures involving the lateral second, third, fourth, fifth, sixth, and seventh ribs. No expansile or destructive bone lesions. IMPRESSION: Multiple acute displaced right rib fractures with small right pneumothorax. These results were called by telephone at the time of interpretation on 02/05/2017 at 6:50 am to Dr. Ezequiel Essex , who verbally acknowledged these results. Electronically Signed   By: Lucienne Capers M.D.   On: 02/05/2017 06:52   Dg Clavicle Right  Result Date: 02/05/2017 CLINICAL DATA:  [place] this morning. Pain and swelling right clavicle. Right chest pain. EXAM: RIGHT CLAVICLE - 2+ VIEWS COMPARISON:  None. FINDINGS: Right clavicle appears intact. No evidence of acute fracture or dislocation. Coracoclavicular and acromioclavicular spaces are maintained. Multiple right rib fractures are demonstrated. Small right pneumothorax. IMPRESSION: Right clavicle appears intact. Multiple right rib fractures. Small right pneumothorax. See additional report of chest and right ribs. Electronically Signed   By: Lucienne Capers M.D.   On: 02/05/2017 06:48   Ct Head Wo Contrast  Result Date: 02/05/2017 CLINICAL DATA:  Fall.  Head injury EXAM: CT HEAD WITHOUT CONTRAST CT CERVICAL SPINE WITHOUT CONTRAST TECHNIQUE: Multidetector CT imaging of the head and cervical spine was performed following the standard protocol without intravenous contrast. Multiplanar CT image reconstructions of the cervical spine were also generated.  COMPARISON:  None. FINDINGS: CT HEAD FINDINGS Brain: Ventricle size and cerebral volume normal for age. Negative for infarct, hemorrhage, or mass lesion. Vascular: Negative for hyperdense vessel Skull: Negative Sinuses/Orbits: Negative Other: None CT CERVICAL SPINE FINDINGS Alignment: Mild retrolisthesis at C3-4. 3 mm anterolisthesis C4-5. Mild retrolisthesis C6-7 Skull base and vertebrae: Negative for fracture Soft tissues and spinal canal: No soft tissue mass. Extensive carotid artery calcification bilaterally Disc levels: Multilevel disc and facet degeneration. Partial fusion at C5-6 which may be congenital. Foraminal encroachment bilaterally due to spurring at C3-4, C4-5, C6-7. Upper chest: Negative Other: None IMPRESSION: Negative CT head Advanced cervical degenerative changes without fracture. Electronically Signed   By: Franchot Gallo M.D.   On: 02/05/2017 07:21   Ct Chest W Contrast  Result Date: 02/05/2017 CLINICAL DATA:  Fall getting up this morning EXAM: CT CHEST WITH CONTRAST TECHNIQUE: Multidetector CT imaging of the chest was performed during intravenous contrast administration. CONTRAST:  71m ISOVUE-300 IOPAMIDOL (ISOVUE-300) INJECTION 61% COMPARISON:  None. FINDINGS: Cardiovascular: Normal heart size. No pericardial effusion. No evidence of great vessel injury. Atherosclerotic calcification of the aorta and coronaries. Aortic valve calcification. Mediastinum/Nodes: Negative for hematoma or pneumomediastinum. Negative for adenopathy or mass. Lungs/Pleura: There is a small right pneumothorax, less than 10%. Trace right pleural effusion that is water density. At least 2 subpleural ground-glass densities in the right upper lobe along rib fractures, likely mild contusion. Mild bilateral atelectatic change. There is biapical pleural based scarring, more prominent on the right due to the neighboring pneumothorax. Subpleural density along the lower right major fissure has a flat appearance, favoring  atelectasis over a nodule. Upper Abdomen: No evidence of injury. Atherosclerotic calcifications. Musculoskeletal: Mid right clavicle fracture with mild apex ventral angulation. Neighboring soft tissue  hematoma. Lateral right third, fourth, fifth, sixth, seventh, and eighth rib fractures. Posterior right third and seventh rib fractures. No left-sided rib fractures noted. Remote appearing L1 compression fracture with advanced height loss anteriorly and mild retropulsion. Bilateral breast implants which are intact. IMPRESSION: 1. Small right pneumothorax, less than 10%. Trace right pleural effusion that is low-density. Few small pulmonary contusions in the right upper lobe deep to rib fractures. 2. Right lateral third through eighth rib fractures. Posterior right third and seventh rib fractures. 3. Mildly angulated right mid clavicle fracture. 4. Aortic Atherosclerosis (ICD10-I70.0).  Coronary atherosclerosis. Electronically Signed   By: Monte Fantasia M.D.   On: 02/05/2017 07:30   Ct Cervical Spine Wo Contrast  Result Date: 02/05/2017 CLINICAL DATA:  Fall.  Head injury EXAM: CT HEAD WITHOUT CONTRAST CT CERVICAL SPINE WITHOUT CONTRAST TECHNIQUE: Multidetector CT imaging of the head and cervical spine was performed following the standard protocol without intravenous contrast. Multiplanar CT image reconstructions of the cervical spine were also generated. COMPARISON:  None. FINDINGS: CT HEAD FINDINGS Brain: Ventricle size and cerebral volume normal for age. Negative for infarct, hemorrhage, or mass lesion. Vascular: Negative for hyperdense vessel Skull: Negative Sinuses/Orbits: Negative Other: None CT CERVICAL SPINE FINDINGS Alignment: Mild retrolisthesis at C3-4. 3 mm anterolisthesis C4-5. Mild retrolisthesis C6-7 Skull base and vertebrae: Negative for fracture Soft tissues and spinal canal: No soft tissue mass. Extensive carotid artery calcification bilaterally Disc levels: Multilevel disc and facet degeneration.  Partial fusion at C5-6 which may be congenital. Foraminal encroachment bilaterally due to spurring at C3-4, C4-5, C6-7. Upper chest: Negative Other: None IMPRESSION: Negative CT head Advanced cervical degenerative changes without fracture. Electronically Signed   By: Franchot Gallo M.D.   On: 02/05/2017 07:21    ROS:  A comprehensive review of systems was negative except for: Respiratory: positive for difficulty with deep breating Musculoskeletal: positive for right sided chest wall pain  Blood pressure (!) 208/73, pulse 86, resp. rate 17, height 5' 5"  (1.651 m), weight 118 lb (53.5 kg), SpO2 99 %. Physical Exam  Constitutional: She is oriented to person, place, and time and well-developed, well-nourished, and in no distress.  HENT:  Head: Normocephalic and atraumatic.  Eyes: Pupils are equal, round, and reactive to light.  Neck: Normal range of motion.  Cardiovascular: Normal rate and regular rhythm.   Pulmonary/Chest: She has decreased breath sounds.  Effort poor, minimal air movement  Abdominal: Soft. There is no tenderness. There is no rebound.  Musculoskeletal: Normal range of motion. She exhibits no edema or deformity.  Right clavicular area/ shoulder tender with swelling, difficulty moving right arm at shoulder  Neurological: She is alert and oriented to person, place, and time.  Skin: Skin is warm and dry.  Psychiatric: Mood, memory, affect and judgment normal.    Assessment/Plan: Ms. Pomplun is a 83 yo female with multiple rib fractures, poor effort on pulmonary toilet with IS of only about 350.  She is currently having pain on the right lateral chest wall.  Given the extent of her rib fractures I spoke to the ED this AM and recommended transfer to [facility] for further care pending need for intubation and more aggressive analgesia and pulmonary toilet.  Also spoke with hospitalist, Dr. Clementeen Graham regarding same thoughts.   Dr. Clementeen Graham has spoken to the trauma service and is going to  transfer the patient to [facility] where the trauma service can help care for her.   -For now I recommend Multi modality pain control- I am  starting with schedule ibuprofen (renal function ok) and tylenol and PRN morphine. I am also going to get lidocaine patches to the area.   -[facility] may have the ability to place an epidural and this could help with pain control for these rib fractures vs placement of an ON-Q type regional analgesic system for local pain control  -Keep NPO with meds until pain better controlled -IS needs to be done aggressively every 1 hr, and have discussed with patient's husband need to assist in reminding the patient to do IS more often if able  -Repeat CXR at 12 noon for the apical PTX to verify not progressing, could need chest tube if worsens but unlikely  -Recommend at least monitored stepdown unit if not ICU based on common protocols regarding rib fractures in the elderly   Have discussed risk of mortality and intubation with this patient and her husband. Discussed that given the number of rib fractures and her age, can be as high as 10% increase for every fracture putting her at a 60-70% risk of mortality. Given her IS effort currently, I think it is highly likely she will require intubation if we cannot get her pain under control. Discussed that some clavicle fractures are amenable to surgery versus sling/ swath, and that an Orthopedic surgeon would better be able to discuss these options down the road.  Rib fractures are not displaced enough to require any fixation.   At this time the patient and husband want everything and would want intubation if it would save her life.     Virl Cagey 02/05/2017, 9:27 AM

## 2017-02-05 NOTE — Progress Notes (Signed)
Orthopedic Tech Progress Note Patient Details:  Danielle [name] [birth year] [id number]  Ortho Devices Type of Ortho Device: Arm sling Ortho Device/Splint Location: rue Ortho Device/Splint Interventions: Application   Hussein Macdougal 02/05/2017, 11:46 AM

## 2017-02-05 NOTE — ED Notes (Signed)
ABG completed by RT.

## 2017-02-05 NOTE — ED Notes (Signed)
Hospitalist at bedside to speak with pt and husband. Requesting urine culture.

## 2017-02-05 NOTE — ED Triage Notes (Signed)
Pt fell getting up this am, she c/o right lower back pain and right chest pain. Per husband her blood sugar was 60, but now is 81.

## 2017-02-05 NOTE — ED Notes (Signed)
Report given to [facility] 

## 2017-02-05 NOTE — ED Notes (Signed)
O2 initiated for comfort

## 2017-02-05 NOTE — Plan of Care (Addendum)
  Patient seen and examined. Doing very well. Unlikely patient will need intubation. Evaluated by ICU team who recommends transfer out of ICU to stepdown. Agree with this assessment. Trauma service consulted and ok w/ plan as it currently stands. No need for formal consult per my decision after discussion. Will have trauma reinvolved if condition worsens.   Linna Darner, MD [facility] 02/05/2017, 12:27 PM

## 2017-02-06 ENCOUNTER — Inpatient Hospital Stay (HOSPITAL_COMMUNITY): Payer: Medicare Other

## 2017-02-06 ENCOUNTER — Encounter (HOSPITAL_COMMUNITY): Payer: Self-pay

## 2017-02-06 DIAGNOSIS — N390 Urinary tract infection, site not specified: Secondary | ICD-10-CM

## 2017-02-06 DIAGNOSIS — E1165 Type 2 diabetes mellitus with hyperglycemia: Secondary | ICD-10-CM

## 2017-02-06 DIAGNOSIS — B9629 Other Escherichia coli [E. coli] as the cause of diseases classified elsewhere: Secondary | ICD-10-CM

## 2017-02-06 DIAGNOSIS — J939 Pneumothorax, unspecified: Secondary | ICD-10-CM

## 2017-02-06 DIAGNOSIS — Z1612 Extended spectrum beta lactamase (ESBL) resistance: Secondary | ICD-10-CM

## 2017-02-06 LAB — BASIC METABOLIC PANEL
ANION GAP: 8 (ref 5–15)
BUN: 22 mg/dL — ABNORMAL HIGH (ref 6–20)
CHLORIDE: 104 mmol/L (ref 101–111)
CO2: 23 mmol/L (ref 22–32)
Calcium: 8.4 mg/dL — ABNORMAL LOW (ref 8.9–10.3)
Creatinine, Ser: 1.2 mg/dL — ABNORMAL HIGH (ref 0.44–1.00)
GFR calc non Af Amer: 41 mL/min — ABNORMAL LOW (ref >60)
GFR, EST AFRICAN AMERICAN: 47 mL/min — AB (ref >60)
Glucose, Bld: 165 mg/dL — ABNORMAL HIGH (ref 65–99)
Potassium: 3.5 mmol/L (ref 3.5–5.1)
Sodium: 135 mmol/L (ref 135–145)

## 2017-02-06 LAB — CBC
HCT: 35.6 % — ABNORMAL LOW (ref 36.0–46.0)
HEMOGLOBIN: 12.2 g/dL (ref 12.0–15.0)
MCH: 31.1 pg (ref 26.0–34.0)
MCHC: 34.3 g/dL (ref 30.0–36.0)
MCV: 90.8 fL (ref 78.0–100.0)
Platelets: 192 10*3/uL (ref 150–400)
RBC: 3.92 MIL/uL (ref 3.87–5.11)
RDW: 14.3 % (ref 11.5–15.5)
WBC: 7.7 10*3/uL (ref 4.0–10.5)

## 2017-02-06 LAB — GLUCOSE, CAPILLARY
GLUCOSE-CAPILLARY: 155 mg/dL — AB (ref 65–99)
GLUCOSE-CAPILLARY: 90 mg/dL (ref 65–99)
Glucose-Capillary: 165 mg/dL — ABNORMAL HIGH (ref 65–99)
Glucose-Capillary: 185 mg/dL — ABNORMAL HIGH (ref 65–99)
Glucose-Capillary: 189 mg/dL — ABNORMAL HIGH (ref 65–99)

## 2017-02-06 MED ORDER — ADULT MULTIVITAMIN W/MINERALS CH
1.0000 | ORAL_TABLET | Freq: Every day | ORAL | Status: DC
  Administered 2017-02-06 – 2017-02-08 (×3): 1 via ORAL
  Filled 2017-02-06 (×3): qty 1

## 2017-02-06 MED ORDER — LEVOTHYROXINE SODIUM 88 MCG PO TABS
88.0000 ug | ORAL_TABLET | Freq: Every day | ORAL | Status: DC
  Administered 2017-02-06 – 2017-02-08 (×3): 88 ug via ORAL
  Filled 2017-02-06 (×3): qty 1

## 2017-02-06 MED ORDER — ONDANSETRON HCL 4 MG PO TABS: 4.0000 mg | ORAL_TABLET | Freq: Four times a day (QID) | ORAL | Status: DC | PRN

## 2017-02-06 MED ORDER — ACETAMINOPHEN 650 MG RE SUPP: 650.0000 mg | Freq: Four times a day (QID) | RECTAL | Status: DC | PRN

## 2017-02-06 MED ORDER — INSULIN ASPART 100 UNIT/ML ~~LOC~~ SOLN: 0.0000 [IU] | Freq: Three times a day (TID) | SUBCUTANEOUS | Status: DC

## 2017-02-06 MED ORDER — ONDANSETRON HCL 4 MG/2ML IJ SOLN: 4.0000 mg | Freq: Four times a day (QID) | INTRAMUSCULAR | Status: DC | PRN

## 2017-02-06 MED ORDER — INSULIN ASPART 100 UNIT/ML ~~LOC~~ SOLN: 0.0000 [IU] | Freq: Every day | SUBCUTANEOUS | Status: DC

## 2017-02-06 MED ORDER — ASPIRIN EC 81 MG PO TBEC
81.0000 mg | DELAYED_RELEASE_TABLET | Freq: Every day | ORAL | Status: DC
  Administered 2017-02-06 – 2017-02-08 (×3): 81 mg via ORAL
  Filled 2017-02-06 (×3): qty 1

## 2017-02-06 MED ORDER — ENOXAPARIN SODIUM 40 MG/0.4ML ~~LOC~~ SOLN: 40.0000 mg | SUBCUTANEOUS | Status: DC

## 2017-02-06 MED ORDER — INSULIN ASPART 100 UNIT/ML ~~LOC~~ SOLN: 0.0000 [IU] | SUBCUTANEOUS | Status: DC

## 2017-02-06 MED ORDER — ACETAMINOPHEN 325 MG PO TABS: 650.0000 mg | ORAL_TABLET | Freq: Four times a day (QID) | ORAL | Status: DC | PRN

## 2017-02-06 MED ORDER — HYDROCODONE-ACETAMINOPHEN 5-325 MG PO TABS: 1.0000 | ORAL_TABLET | ORAL | Status: DC | PRN

## 2017-02-06 MED ORDER — POLYETHYLENE GLYCOL 3350 17 G PO PACK
17.0000 g | PACK | Freq: Every day | ORAL | Status: DC
  Administered 2017-02-08: 17 g via ORAL
  Filled 2017-02-06 (×4): qty 1

## 2017-02-06 MED ORDER — FENTANYL CITRATE (PF) 100 MCG/2ML IJ SOLN: 12.5000 ug | INTRAMUSCULAR | Status: DC | PRN

## 2017-02-06 NOTE — Plan of Care (Signed)
Problem: Safety: Goal: Ability to remain free from injury will improve Outcome: Progressing Low bed ordered for patient.

## 2017-02-06 NOTE — Progress Notes (Addendum)
PROGRESS NOTE    Danielle [name]  CWC:[id number] DOB: [birth year] DOA: 02/05/2017 PCP: Sinda Du, MD   Brief Narrative:  83 y.o. WF PMHx  Diabetes Type 2 on Insulin, S/P Breast Enlargement    sulin, low back pain presented from home with possibly a mechanical fall.  She landed on her right side. Patient informs getting up early this morning to go to the bathroom. She denies dizziness or lightheadedness or tripping on anything. She is not sure if she passed out . Husband heard a thump and woke up from sleep. He thinks she did not lose consciousness and was morning on the floor. Denies hitting her head on any object. He checked her blood glucose which was 60.  Patient denies any headache, blurred vision, chest pain, palpitations, shortness of breath, abdominal pain, bowel or urinary symptoms. Denies any new medications, use of benzos or narcotics. Denies recent illness. Patient had severe pain in right side of her chest and her shoulder. Patient brought to the ED where she was hypertensive with blood pressure of 202/81 mmHg. Sats were 99% on 2 L. Blood work was unremarkable. X-ray of the chest and right ribs showed multiple displaced fractures involving the right second to seventh rib. CT head and cervical spine were negative for any acute injury or bleed. Patient had a CT chest with contrast again showing displaced fractures involving right lateral third through eighth rib and right posterior second through seventh ribs. She also had right midclavicular angulated fracture. Also noted on chest CT was less than 10% right apical pneumothorax along with some lung contusions around the rib fractures. Her UA was positive for UTI. Patient given 50 mg IV fentanyl and hospitalist consulted for admission. General surgery consulted as well.    Subjective: A/O 4, negative CP, negative SOB, negative N/V, negative abdominal pain. Positive right shoulder and right thoracic pain appropriate for  fractures.   Assessment & Plan:   Principal Problem:   Multiple rib fractures Active Problems:   Pneumothorax   Type 2 diabetes mellitus with hypoglycemia without coma (HCC)   Clavicle fracture   Elevated blood pressure reading   UTI (urinary tract infection)   Right apical pneumothorax -Multiple right rib fracture secondary to trauma -Titrate O2 to maintain SPO2 > 93% -Has been evaluated by surgery. Injury nonsurgical, medical management. -PT/OT consult pending   Right clavicle fracture. -Continue pain control regimen -     Type 2 diabetes mellitus with hypoglycemia without coma (HCC) -Hemoglobin A1c pending -Lipid panel pending excised- -Lantus 5 units daily -Moderate SSI     Elevated blood pressure reading -Pain most likely contributing. -Given patient has diabetes will start low-dose lisinopril 5 mg daily    positive Escherichia coli  UTI (urinary tract infection) -Complete five-day course antibiotics   Goals of care -9/21 PT/OT consult: Multiple right rib fracture and right clavicle fracture evaluate for SNF vs home health  DVT prophylaxis: Lovenox Code Status: Full Family Communication: Husband and daughter at bedside discussion of plan care Disposition Plan: Discharge next 24-48 hours   Consultants:  [facility]    Procedures/Significant Events:     I have personally reviewed and interpreted all radiology studies and my findings are as above.  VENTILATOR SETTINGS:    Cultures 9/20 urine positive Escherichia coli 9/20 MRSA by PCR negative   Antimicrobials: Anti-infectives    Start     Dose/Rate Stop   02/05/17 0930  cefTRIAXone (ROCEPHIN) 1 g in dextrose 5 % 50 mL  IVPB     1 g 100 mL/hr over 30 Minutes         Devices    LINES / TUBES:      Continuous Infusions: . sodium chloride 75 mL/hr at 02/06/17 0146  . cefTRIAXone (ROCEPHIN)  IV Stopped (02/06/17 1056)     Objective: Vitals:   02/06/17 0114  02/06/17 0321 02/06/17 0700 02/06/17 1140  BP: (!) 143/61 (!) 136/54 (!) 121/54 (!) 140/57  Pulse: 70 70    Resp: 14 19 16 15   Temp: 99 F (37.2 C) 98.3 F (36.8 C) 98.4 F (36.9 C) 97.7 F (36.5 C)  TempSrc: Oral Oral Oral   SpO2: 98% 100% 100%   Weight: 118 lb (53.5 kg)     Height: 5\' 5"  (1.651 m)       Intake/Output Summary (Last 24 hours) at 02/06/17 1327 Last data filed at 02/06/17 0114  Gross per 24 hour  Intake              615 ml  Output              275 ml  Net              340 ml   Filed Weights   02/05/17 0602 02/06/17 0103 02/06/17 0114  Weight: 118 lb (53.5 kg) 118 lb (53.5 kg) 118 lb (53.5 kg)    Examination:  General: A/O 4, No acute respiratory distress Neck:  Negative scars, masses, torticollis, lymphadenopathy, JVD Lungs: Clear to auscultation bilaterally without wheezes or crackles, appropriate tenderness along RIGHT lateral chest wall to palpation Cardiovascular: Regular rate and rhythm without murmur gallop or rub normal S1 and S2 Abdomen: negative abdominal pain, nondistended, positive soft, bowel sounds, no rebound, no ascites, no appreciable mass Extremities: No significant cyanosis, clubbing, or edema bilateral lower extremities. Right arm in sling Skin: Negative rashes, lesions, ulcers Psychiatric:  Negative depression, negative anxiety, negative fatigue, negative mania  Central nervous system:  Cranial nerves II through XII intact, tongue/uvula midline, all extremities muscle strength 5/5, sensation intact throughout, negative dysarthria, negative expressive aphasia, negative receptive aphasia.  .     Data Reviewed: Care during the described time interval was provided by me .  I have reviewed this patient's available data, including medical history, events of note, physical examination, and all test results as part of my evaluation.   CBC:  Recent Labs Lab 02/05/17 0619 02/05/17 2232 02/06/17 0209  WBC 10.0 9.3 7.7  NEUTROABS 7.7  --    --   HGB 15.6* 12.7 12.2  HCT 45.5 37.1 35.6*  MCV 92.3 90.5 90.8  PLT 226 206 676   Basic Metabolic Panel:  Recent Labs Lab 02/05/17 0619 02/05/17 2232 02/06/17 0209  NA 138  --  135  K 4.3  --  3.5  CL 99*  --  104  CO2 26  --  23  GLUCOSE 93  --  165*  BUN 19  --  22*  CREATININE 0.78 1.23* 1.20*  CALCIUM 9.5  --  8.4*   GFR: Estimated Creatinine Clearance: 30.5 mL/min (A) (by C-G formula based on SCr of 1.2 mg/dL (H)). Liver Function Tests: No results for input(s): AST, ALT, ALKPHOS, BILITOT, PROT, ALBUMIN in the last 168 hours. No results for input(s): LIPASE, AMYLASE in the last 168 hours. No results for input(s): AMMONIA in the last 168 hours. Coagulation Profile: No results for input(s): INR, PROTIME in the last 168 hours. Cardiac Enzymes:  Recent Labs  Lab 02/05/17 0619  TROPONINI <0.03   BNP (last 3 results) No results for input(s): PROBNP in the last 8760 hours. HbA1C: No results for input(s): HGBA1C in the last 72 hours. CBG:  Recent Labs Lab 02/05/17 1223 02/05/17 1806 02/05/17 2330 02/06/17 0801 02/06/17 1146  GLUCAP 127* 198* 155* 165* 155*   Lipid Profile: No results for input(s): CHOL, HDL, LDLCALC, TRIG, CHOLHDL, LDLDIRECT in the last 72 hours. Thyroid Function Tests: No results for input(s): TSH, T4TOTAL, FREET4, T3FREE, THYROIDAB in the last 72 hours. Anemia Panel: No results for input(s): VITAMINB12, FOLATE, FERRITIN, TIBC, IRON, RETICCTPCT in the last 72 hours. Urine analysis:    Component Value Date/Time   COLORURINE YELLOW 02/05/2017 0620   APPEARANCEUR CLEAR 02/05/2017 0620   LABSPEC 1.020 02/05/2017 0620   PHURINE 7.5 02/05/2017 0620   GLUCOSEU >=500 (A) 02/05/2017 0620   HGBUR MODERATE (A) 02/05/2017 0620   BILIRUBINUR NEGATIVE 02/05/2017 0620   KETONESUR 15 (A) 02/05/2017 0620   PROTEINUR NEGATIVE 02/05/2017 0620   NITRITE POSITIVE (A) 02/05/2017 0620   LEUKOCYTESUR TRACE (A) 02/05/2017 0620   Sepsis  Labs: @LABRCNTIP (procalcitonin:4,lacticidven:4)  ) Recent Results (from the past 240 hour(s))  Urine culture     Status: Abnormal (Preliminary result)   Collection Time: 02/05/17  8:32 AM  Result Value Ref Range Status   Specimen Description URINE, CLEAN CATCH  Final   Special Requests NONE  Final   Culture (A)  Final    >=100,000 COLONIES/mL ESCHERICHIA COLI SUSCEPTIBILITIES TO FOLLOW Performed at [facility], [address] [place], [place] [address]    Report Status PENDING  Incomplete  MRSA PCR Screening     Status: None   Collection Time: 02/05/17 12:05 PM  Result Value Ref Range Status   MRSA by PCR NEGATIVE NEGATIVE Final    Comment:        The GeneXpert MRSA Assay (FDA approved for NASAL specimens only), is one component of a comprehensive MRSA colonization surveillance program. It is not intended to diagnose MRSA infection nor to guide or monitor treatment for MRSA infections.          Radiology Studies: Dg Chest 2 View  Result Date: 02/06/2017 CLINICAL DATA:  Pneumothorax with rib fractures. EXAM: CHEST  2 VIEW COMPARISON:  02/05/2017 FINDINGS: Right apical pneumothorax is similar to prior. There is some atelectasis at the right lung base and probable tiny right pleural effusion. Left lung clear. The cardiopericardial silhouette is within normal limits for size. Right-sided rib fractures again noted. Telemetry leads overlie the chest. IMPRESSION: Stable small right apical pneumothorax. Electronically Signed   By: Misty Stanley M.D.   On: 02/06/2017 09:18   Dg Ribs Unilateral W/chest Right  Result Date: 02/05/2017 CLINICAL DATA:  Right chest pain after a fall. EXAM: RIGHT RIBS AND CHEST - 3+ VIEW COMPARISON:  Chest 04/04/2013 FINDINGS: Normal heart size and pulmonary vascularity. No airspace disease or consolidation in the lungs. No blunting of costophrenic angles. Tiny right apical pneumothorax. Bilateral breast implants. Calcification of the aorta.  Thoracic scoliosis convex towards the left. Right ribs demonstrate acute displaced fractures involving the lateral second, third, fourth, fifth, sixth, and seventh ribs. No expansile or destructive bone lesions. IMPRESSION: Multiple acute displaced right rib fractures with small right pneumothorax. These results were called by telephone at the time of interpretation on 02/05/2017 at 6:50 am to Dr. Ezequiel Essex , who verbally acknowledged these results. Electronically Signed   By: Lucienne Capers M.D.   On:  02/05/2017 06:52   Dg Clavicle Right  Result Date: 02/05/2017 CLINICAL DATA:  [place] this morning. Pain and swelling right clavicle. Right chest pain. EXAM: RIGHT CLAVICLE - 2+ VIEWS COMPARISON:  None. FINDINGS: Right clavicle appears intact. No evidence of acute fracture or dislocation. Coracoclavicular and acromioclavicular spaces are maintained. Multiple right rib fractures are demonstrated. Small right pneumothorax. IMPRESSION: Right clavicle appears intact. Multiple right rib fractures. Small right pneumothorax. See additional report of chest and right ribs. Electronically Signed   By: Lucienne Capers M.D.   On: 02/05/2017 06:48   Ct Head Wo Contrast  Result Date: 02/05/2017 CLINICAL DATA:  Fall.  Head injury EXAM: CT HEAD WITHOUT CONTRAST CT CERVICAL SPINE WITHOUT CONTRAST TECHNIQUE: Multidetector CT imaging of the head and cervical spine was performed following the standard protocol without intravenous contrast. Multiplanar CT image reconstructions of the cervical spine were also generated. COMPARISON:  None. FINDINGS: CT HEAD FINDINGS Brain: Ventricle size and cerebral volume normal for age. Negative for infarct, hemorrhage, or mass lesion. Vascular: Negative for hyperdense vessel Skull: Negative Sinuses/Orbits: Negative Other: None CT CERVICAL SPINE FINDINGS Alignment: Mild retrolisthesis at C3-4. 3 mm anterolisthesis C4-5. Mild retrolisthesis C6-7 Skull base and vertebrae: Negative for fracture  Soft tissues and spinal canal: No soft tissue mass. Extensive carotid artery calcification bilaterally Disc levels: Multilevel disc and facet degeneration. Partial fusion at C5-6 which may be congenital. Foraminal encroachment bilaterally due to spurring at C3-4, C4-5, C6-7. Upper chest: Negative Other: None IMPRESSION: Negative CT head Advanced cervical degenerative changes without fracture. Electronically Signed   By: Franchot Gallo M.D.   On: 02/05/2017 07:21   Ct Chest W Contrast  Result Date: 02/05/2017 CLINICAL DATA:  Fall getting up this morning EXAM: CT CHEST WITH CONTRAST TECHNIQUE: Multidetector CT imaging of the chest was performed during intravenous contrast administration. CONTRAST:  44mL ISOVUE-300 IOPAMIDOL (ISOVUE-300) INJECTION 61% COMPARISON:  None. FINDINGS: Cardiovascular: Normal heart size. No pericardial effusion. No evidence of great vessel injury. Atherosclerotic calcification of the aorta and coronaries. Aortic valve calcification. Mediastinum/Nodes: Negative for hematoma or pneumomediastinum. Negative for adenopathy or mass. Lungs/Pleura: There is a small right pneumothorax, less than 10%. Trace right pleural effusion that is water density. At least 2 subpleural ground-glass densities in the right upper lobe along rib fractures, likely mild contusion. Mild bilateral atelectatic change. There is biapical pleural based scarring, more prominent on the right due to the neighboring pneumothorax. Subpleural density along the lower right major fissure has a flat appearance, favoring atelectasis over a nodule. Upper Abdomen: No evidence of injury. Atherosclerotic calcifications. Musculoskeletal: Mid right clavicle fracture with mild apex ventral angulation. Neighboring soft tissue hematoma. Lateral right third, fourth, fifth, sixth, seventh, and eighth rib fractures. Posterior right third and seventh rib fractures. No left-sided rib fractures noted. Remote appearing L1 compression fracture with  advanced height loss anteriorly and mild retropulsion. Bilateral breast implants which are intact. IMPRESSION: 1. Small right pneumothorax, less than 10%. Trace right pleural effusion that is low-density. Few small pulmonary contusions in the right upper lobe deep to rib fractures. 2. Right lateral third through eighth rib fractures. Posterior right third and seventh rib fractures. 3. Mildly angulated right mid clavicle fracture. 4. Aortic Atherosclerosis (ICD10-I70.0).  Coronary atherosclerosis. Electronically Signed   By: Monte Fantasia M.D.   On: 02/05/2017 07:30   Ct Cervical Spine Wo Contrast  Result Date: 02/05/2017 CLINICAL DATA:  Fall.  Head injury EXAM: CT HEAD WITHOUT CONTRAST CT CERVICAL SPINE WITHOUT CONTRAST TECHNIQUE: Multidetector CT imaging  of the head and cervical spine was performed following the standard protocol without intravenous contrast. Multiplanar CT image reconstructions of the cervical spine were also generated. COMPARISON:  None. FINDINGS: CT HEAD FINDINGS Brain: Ventricle size and cerebral volume normal for age. Negative for infarct, hemorrhage, or mass lesion. Vascular: Negative for hyperdense vessel Skull: Negative Sinuses/Orbits: Negative Other: None CT CERVICAL SPINE FINDINGS Alignment: Mild retrolisthesis at C3-4. 3 mm anterolisthesis C4-5. Mild retrolisthesis C6-7 Skull base and vertebrae: Negative for fracture Soft tissues and spinal canal: No soft tissue mass. Extensive carotid artery calcification bilaterally Disc levels: Multilevel disc and facet degeneration. Partial fusion at C5-6 which may be congenital. Foraminal encroachment bilaterally due to spurring at C3-4, C4-5, C6-7. Upper chest: Negative Other: None IMPRESSION: Negative CT head Advanced cervical degenerative changes without fracture. Electronically Signed   By: Franchot Gallo M.D.   On: 02/05/2017 07:21   Dg Chest Port 1 View  Result Date: 02/05/2017 CLINICAL DATA:  Trauma, fall with multiple rib  fractures. Apical pneumothorax EXAM: PORTABLE CHEST 1 VIEW COMPARISON:  CT 02/05/2017 FINDINGS: Right side pneumothorax is again noted, stable since prior study. Small right pleural effusion. Increasing right lower lobe atelectasis or infiltrate. Mild left base atelectasis. Heart is borderline in size. IMPRESSION: Stable small right apical pneumothorax, approximately 10%. Small right pleural effusion. Increasing right lower lobe atelectasis or infiltrate. Electronically Signed   By: Rolm Baptise M.D.   On: 02/05/2017 10:24        Scheduled Meds: . acetaminophen  1,000 mg Oral Q6H  . aspirin EC  81 mg Oral Daily  . Chlorhexidine Gluconate Cloth  6 each Topical Daily  . enoxaparin (LOVENOX) injection  40 mg Subcutaneous Q24H  . ibuprofen  800 mg Oral Q6H  . insulin aspart  0-15 Units Subcutaneous TID WC  . insulin aspart  0-5 Units Subcutaneous QHS  . levothyroxine  88 mcg Oral QAC breakfast  . lidocaine  2 patch Transdermal Q24H  . multivitamin with minerals  1 tablet Oral Daily  . polyethylene glycol  17 g Oral Daily   Continuous Infusions: . sodium chloride 75 mL/hr at 02/06/17 0146  . cefTRIAXone (ROCEPHIN)  IV Stopped (02/06/17 1056)     LOS: 1 day    Time spent: 40 minutes    WOODS, Geraldo Docker, MD [facility] Pager [phone]   If 7PM-7AM, please contact night-coverage [email or web address] Password [email or web address] 02/06/2017, 1:27 PM

## 2017-02-07 DIAGNOSIS — E118 Type 2 diabetes mellitus with unspecified complications: Secondary | ICD-10-CM

## 2017-02-07 DIAGNOSIS — E784 Other hyperlipidemia: Secondary | ICD-10-CM

## 2017-02-07 LAB — GLUCOSE, CAPILLARY
GLUCOSE-CAPILLARY: 255 mg/dL — AB (ref 65–99)
GLUCOSE-CAPILLARY: 147 mg/dL — AB (ref 65–99)
GLUCOSE-CAPILLARY: 179 mg/dL — AB (ref 65–99)
GLUCOSE-CAPILLARY: 192 mg/dL — AB (ref 65–99)
Glucose-Capillary: 180 mg/dL — ABNORMAL HIGH (ref 65–99)

## 2017-02-07 LAB — HEMOGLOBIN A1C
Hgb A1c MFr Bld: 7.5 % — ABNORMAL HIGH (ref 4.8–5.6)
MEAN PLASMA GLUCOSE: 168.55 mg/dL

## 2017-02-07 LAB — BASIC METABOLIC PANEL
Anion gap: 16 — ABNORMAL HIGH (ref 5–15)
BUN: 22 mg/dL — AB (ref 6–20)
CO2: 14 mmol/L — ABNORMAL LOW (ref 22–32)
CREATININE: 1.39 mg/dL — AB (ref 0.44–1.00)
Calcium: 8.9 mg/dL (ref 8.9–10.3)
Chloride: 107 mmol/L (ref 101–111)
GFR, EST AFRICAN AMERICAN: 40 mL/min — AB (ref >60)
GFR, EST NON AFRICAN AMERICAN: 34 mL/min — AB (ref >60)
Glucose, Bld: 214 mg/dL — ABNORMAL HIGH (ref 65–99)
Potassium: 3.8 mmol/L (ref 3.5–5.1)
Sodium: 137 mmol/L (ref 135–145)

## 2017-02-07 LAB — LIPID PANEL
CHOLESTEROL: 215 mg/dL — AB (ref 0–200)
HDL: 67 mg/dL (ref >40)
LDL Cholesterol: 128 mg/dL — ABNORMAL HIGH (ref 0–99)
Total CHOL/HDL Ratio: 3.2 RATIO
Triglycerides: 100 mg/dL (ref <150)
VLDL: 20 mg/dL (ref 0–40)

## 2017-02-07 LAB — MAGNESIUM: MAGNESIUM: 1.9 mg/dL (ref 1.7–2.4)

## 2017-02-07 MED ORDER — ATORVASTATIN CALCIUM 20 MG PO TABS
20.0000 mg | ORAL_TABLET | Freq: Every day | ORAL | Status: DC
  Administered 2017-02-07: 20 mg via ORAL
  Filled 2017-02-07 (×2): qty 1

## 2017-02-07 MED ORDER — LISINOPRIL 5 MG PO TABS
5.0000 mg | ORAL_TABLET | Freq: Every day | ORAL | Status: DC
  Administered 2017-02-07 – 2017-02-08 (×2): 5 mg via ORAL
  Filled 2017-02-07 (×2): qty 1

## 2017-02-07 MED ORDER — CEFIXIME 400 MG PO CAPS
400.0000 mg | ORAL_CAPSULE | Freq: Every day | ORAL | Status: DC
  Administered 2017-02-08: 400 mg via ORAL
  Filled 2017-02-07: qty 1

## 2017-02-07 MED ORDER — INSULIN GLARGINE 100 UNIT/ML ~~LOC~~ SOLN
5.0000 [IU] | Freq: Every day | SUBCUTANEOUS | Status: DC
  Administered 2017-02-07 – 2017-02-08 (×2): 5 [IU] via SUBCUTANEOUS
  Filled 2017-02-07 (×2): qty 0.05

## 2017-02-07 NOTE — Evaluation (Signed)
Physical Therapy Evaluation Patient Details Name: Danielle [name] MRN: [id number] DOB: 1932/03/25 Today's Date: 02/07/2017   History of Present Illness  Danielle [name]  is a 83 y.o. female, History of diabetes mellitus on insulin, low back pain presented from home with possibly a mechanical fall. Pt with R rib fxs 2-7, R clavicular fx, and pneumothorax  Clinical Impression  Pt admitted with above diagnosis. Pt currently with functional limitations due to the deficits listed below (see PT Problem List). Pt able to get to EOB and ambulate 15' with min HHA. Recommend OT eval for mgmt of RUE in sling. Pt would benefit from [facility] as well as HHPT/OT.  Pt will benefit from skilled PT to increase their independence and safety with mobility to allow discharge to the venue listed below.       Follow Up Recommendations Home health PT;Supervision for mobility/OOB    Equipment Recommendations  None recommended by PT    Recommendations for Other Services OT consult     Precautions / Restrictions Precautions Precautions: Fall Required Braces or Orthoses: Sling Restrictions Weight Bearing Restrictions: Yes RUE Weight Bearing: Non weight bearing      Mobility  Bed Mobility Overal bed mobility: Needs Assistance Bed Mobility: Supine to Sit     Supine to sit: Min assist     General bed mobility comments: min A for LE's off bed and vc's for sequencing and protection of RUE. Pt was able to use momentum to elevate trunk to sitting once LE's off  Transfers Overall transfer level: Needs assistance Equipment used: 1 person hand held assist Transfers: Sit to/from Stand Sit to Stand: Min assist         General transfer comment: min A to steady  Ambulation/Gait Ambulation/Gait assistance: Min assist;+2 safety/equipment Ambulation Distance (Feet): 15 Feet Assistive device: 1 person hand held assist Gait Pattern/deviations: Step-through pattern;Decreased stride length Gait velocity:  decreased Gait velocity interpretation: <1.8 ft/sec, indicative of risk for recurrent falls General Gait Details: min HHA to steady and +2 for equipment. Pt deferred further ambulation due to nausea.   Stairs            Wheelchair Mobility    Modified Rankin (Stroke Patients Only)       Balance Overall balance assessment: Needs assistance Sitting-balance support: Single extremity supported Sitting balance-Leahy Scale: Fair     Standing balance support: Single extremity supported Standing balance-Leahy Scale: Fair                               Pertinent Vitals/Pain Pain Assessment: 0-10 Pain Score: 7  Pain Location: R side body and shoulder Pain Descriptors / Indicators: Aching Pain Intervention(s): Limited activity within patient's tolerance;Monitored during session    [place] expects to be discharged to:: Private residence Living Arrangements: Spouse/significant other Available Help at Discharge: Family;Available 24 hours/day Type of Home: House Home Access: Stairs to enter   [employer] of Steps: 2 Home Layout: Multi-level;Able to live on main level with bedroom/bathroom Home Equipment: Gilford Rile - 2 wheels;Cane - single point;Wheelchair - manual Additional Comments: pt has fallen before but it has been several years. She does not know how she fell, she was getting up for the day and heading to the bathroom. Lights off. Maybe caughter her leg on the trunk at the foot of the bed    Prior Function Level of Independence: Independent  Hand Dominance   Dominant Hand: Right    Extremity/Trunk Assessment   Upper Extremity Assessment Upper Extremity Assessment: RUE deficits/detail RUE Deficits / Details: RUE in sling for clavicular fx RUE: Unable to fully assess due to pain;Unable to fully assess due to immobilization    Lower Extremity Assessment Lower Extremity Assessment: Overall WFL for tasks  assessed    Cervical / Trunk Assessment Cervical / Trunk Assessment: Kyphotic  Communication   Communication: No difficulties  Cognition Arousal/Alertness: Awake/alert Behavior During Therapy: WFL for tasks assessed/performed Overall Cognitive Status: Within Functional Limits for tasks assessed                                        General Comments General comments (skin integrity, edema, etc.): VSS throughout ambulation, O2 sats remained in 90's on RA. Encouraged use of incentive spirometer    Exercises     Assessment/Plan    PT Assessment Patient needs continued PT services  PT Problem List Decreased balance;Decreased mobility;Decreased knowledge of use of DME;Pain;Decreased knowledge of precautions       PT Treatment Interventions DME instruction;Gait training;Stair training;Functional mobility training;Therapeutic activities;Therapeutic exercise;Balance training;Patient/family education    PT Goals (Current goals can be found in the Care Plan section)  Acute Rehab PT Goals Patient Stated Goal: pt wants to go home PT Goal Formulation: With patient/family Time For Goal Achievement: 02/21/17 Potential to Achieve Goals: Good    Frequency Min 4X/week   Barriers to discharge        Co-evaluation               AM-PAC PT "6 Clicks" Daily Activity  Outcome Measure Difficulty turning over in bed (including adjusting bedclothes, sheets and blankets)?: Unable Difficulty moving from lying on back to sitting on the side of the bed? : Unable Difficulty sitting down on and standing up from a chair with arms (e.g., wheelchair, bedside commode, etc,.)?: Unable Help needed moving to and from a bed to chair (including a wheelchair)?: A Little Help needed walking in hospital room?: A Little Help needed climbing 3-5 steps with a railing? : A Little 6 Click Score: 12    End of Session Equipment Utilized During Treatment: Other (comment) (sling) Activity  Tolerance: Patient tolerated treatment well Patient left: in chair;with call bell/phone within reach;with family/visitor present Nurse Communication: Mobility status PT Visit Diagnosis: Unsteadiness on feet (R26.81);Pain;Difficulty in walking, not elsewhere classified (R26.2) Pain - Right/Left: Right Pain - part of body: Shoulder    Time: 0912-0949 PT Time Calculation (min) (ACUTE ONLY): 37 min   Charges:   PT Evaluation $PT Eval Moderate Complexity: 1 Mod PT Treatments $Gait Training: 8-22 mins   PT G Codes:        Leighton Roach, PT  Acute Rehab Services  [place] 02/07/2017, 1:23 PM

## 2017-02-07 NOTE — Progress Notes (Signed)
Pt. Nauseated and dry heaving. Offered anti nausea medication and patient refused. Will continue to monitor

## 2017-02-07 NOTE — Progress Notes (Signed)
PROGRESS NOTE    Danielle [name]  VEL:[id number] DOB: 1931/09/29 DOA: 02/05/2017 PCP: Sinda Du, MD   Brief Narrative:  83 y.o. WF PMHx  Diabetes Type 2 on Insulin, S/P Breast Enlargement    sulin, low back pain presented from home with possibly a mechanical fall.  She landed on her right side. Patient informs getting up early this morning to go to the bathroom. She denies dizziness or lightheadedness or tripping on anything. She is not sure if she passed out . Husband heard a thump and woke up from sleep. He thinks she did not lose consciousness and was morning on the floor. Denies hitting her head on any object. He checked her blood glucose which was 60.  Patient denies any headache, blurred vision, chest pain, palpitations, shortness of breath, abdominal pain, bowel or urinary symptoms. Denies any new medications, use of benzos or narcotics. Denies recent illness. Patient had severe pain in right side of her chest and her shoulder. Patient brought to the ED where she was hypertensive with blood pressure of 202/81 mmHg. Sats were 99% on 2 L. Blood work was unremarkable. X-ray of the chest and right ribs showed multiple displaced fractures involving the right second to seventh rib. CT head and cervical spine were negative for any acute injury or bleed. Patient had a CT chest with contrast again showing displaced fractures involving right lateral third through eighth rib and right posterior second through seventh ribs. She also had right midclavicular angulated fracture. Also noted on chest CT was less than 10% right apical pneumothorax along with some lung contusions around the rib fractures. Her UA was positive for UTI. Patient given 50 mg IV fentanyl and hospitalist consulted for admission. General surgery consulted as well.    Subjective: 9/22  A/O 4, negative CP, negative SOB negative N/3 negative abdominal pain. Positive right shoulder and right lateral thoracic pain appropriate  for fractures. A    Assessment & Plan:   Principal Problem:   Multiple rib fractures Active Problems:   Pneumothorax   Type 2 diabetes mellitus with hypoglycemia without coma (HCC)   Clavicle fracture   Elevated blood pressure reading   UTI (urinary tract infection)   Right apical pneumothorax -Multiple right rib fracture secondary to trauma -Titrate O2 to maintain SPO2 > 93% -Has been evaluated by surgery. Injury nonsurgical, medical management. -PT/OT: Recommend Home health, PT  -PT would like to work with patient one more session on 9/23 before recommending discharge of patient.  Right clavicle fracture. -Continue current pain regimen -Consult placed for PT, Home Health Aide   Type 2 diabetes mellitus uncontrolled with complication -2/58 Hemoglobin A1c= 7.5 -Lantus 5 units daily -Moderate SSI  HLD -Lipid panel not within ADA guidelines -9/22 start Lipitor 20 mg daily    Elevated blood pressure reading -Pain most likely contributing. -Given patient has diabetes will start low-dose lisinopril 5 mg daily    positive Escherichia coli  UTI (urinary tract infection) -Complete five-day course antibiotics  -DC ceftriaxone start Suprax to complete course of antibiotics  Goals of care -9/21 PT/OT consulted: Recommends home health.   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  VENTILATOR SETTINGS:    Cultures 9/20 urine  positive Escherichia coli 9/20 MRSA by PCR negative   Antimicrobials:Anti-infectives    Start     Stop   02/07/17 2000  Cefixime (SUPRAX) capsule 400 mg         02/05/17 0930  cefTRIAXone (ROCEPHIN) 1 g in dextrose 5 % 50 mL IVPB  Status:  Discontinued     02/07/17 1952      Devices    LINES / TUBES:      Continuous Infusions: . sodium chloride 75 mL/hr at 02/07/17 0642  . cefTRIAXone (ROCEPHIN)  IV Stopped (02/07/17 1049)     Objective: Vitals:   02/07/17 0636 02/07/17 0700 02/07/17 1014 02/07/17 1200  BP: (!) 182/85 (!) 142/49 (!) 139/54 (!) 134/56  Pulse:    86  Resp:  20  16  Temp:  98 F (36.7 C)  97.7 F (36.5 C)  TempSrc:  Oral  Oral  SpO2:  96%  97%  Weight:      Height:        Intake/Output Summary (Last 24 hours) at 02/07/17 1422 Last data filed at 02/07/17 0618  Gross per 24 hour  Intake           1567.5 ml  Output             1525 ml  Net             42.5 ml   Filed Weights   02/05/17 0602 02/06/17 0103 02/06/17 0114  Weight: 118 lb (53.5 kg) 118 lb (53.5 kg) 118 lb (53.5 kg)    Examination:  General: A/O 4, No acute respiratory distress Neck:  Negative scars, masses, torticollis, lymphadenopathy, JVD Lungs: Clear to auscultation bilaterally without wheezes or crackles, appropriate tenderness along RIGHT lateral chest wall to palpation Cardiovascular: Regular rate and rhythm without murmur gallop or rub normal S1 and S2 Abdomen: negative abdominal pain, nondistended, positive soft, bowel sounds, no rebound, no ascites, no appreciable mass Extremities: No significant cyanosis, clubbing, or edema bilateral lower extremities. Right arm in sling Skin: Negative rashes, lesions, ulcers Psychiatric:  Negative depression, negative anxiety, negative fatigue, negative mania  Central nervous system:  Cranial nerves II through XII intact, tongue/uvula midline, all extremities muscle strength 5/5, sensation intact throughout, negative dysarthria,  negative expressive aphasia, negative receptive aphasia.  .     Data Reviewed: Care during the described time interval was provided by me .  I have reviewed this patient's available data, including medical history, events of note, physical examination, and all test results as part of my evaluation.   CBC:  Recent Labs Lab 02/05/17 0619 02/05/17 2232 02/06/17 0209  WBC 10.0 9.3 7.7  NEUTROABS 7.7  --   --   HGB 15.6* 12.7 12.2  HCT 45.5 37.1 35.6*  MCV 92.3 90.5 90.8  PLT 226 206 676   Basic Metabolic Panel:  Recent Labs Lab 02/05/17 0619 02/05/17 2232 02/06/17 0209 02/07/17 0815  NA 138  --  135 137  K 4.3  --  3.5 3.8  CL 99*  --  104 107  CO2 26  --  23 14*  GLUCOSE 93  --  165* 214*  BUN 19  --  22* 22*  CREATININE 0.78 1.23* 1.20* 1.39*  CALCIUM 9.5  --  8.4* 8.9  MG  --   --   --  1.9   GFR: Estimated Creatinine Clearance: 26.4 mL/min (A) (by C-G formula based on SCr of 1.39 mg/dL (H)). Liver Function Tests: No results for input(s): AST, ALT, ALKPHOS, BILITOT, PROT, ALBUMIN in the last 168 hours. No results for input(s): LIPASE, AMYLASE in the last 168 hours. No results for input(s): AMMONIA in the last 168 hours. Coagulation Profile: No results for input(s): INR, PROTIME in the last 168 hours. Cardiac Enzymes:  Recent Labs Lab 02/05/17 0619  TROPONINI <0.03   BNP (last 3 results) No results for input(s): PROBNP in the last 8760 hours. HbA1C:  Recent Labs  02/07/17 0815  HGBA1C 7.5*   CBG:  Recent Labs Lab 02/06/17 2055 02/06/17 2349 02/07/17 0344 02/07/17 0744 02/07/17 1205  GLUCAP 185* 189* 180* 255* 147*   Lipid Profile:  Recent Labs  02/07/17 0815  CHOL 215*  HDL 67  LDLCALC 128*  TRIG 100  CHOLHDL 3.2   Thyroid Function Tests: No results for input(s): TSH, T4TOTAL, FREET4, T3FREE, THYROIDAB in the last 72 hours. Anemia Panel: No results for input(s): VITAMINB12, FOLATE, FERRITIN, TIBC, IRON, RETICCTPCT in the last 72  hours. Urine analysis:    Component Value Date/Time   COLORURINE YELLOW 02/05/2017 0620   APPEARANCEUR CLEAR 02/05/2017 0620   LABSPEC 1.020 02/05/2017 0620   PHURINE 7.5 02/05/2017 0620   GLUCOSEU >=500 (A) 02/05/2017 0620   HGBUR MODERATE (A) 02/05/2017 0620   BILIRUBINUR NEGATIVE 02/05/2017 0620   KETONESUR 15 (A) 02/05/2017 0620   PROTEINUR NEGATIVE 02/05/2017 0620   NITRITE POSITIVE (A) 02/05/2017 0620   LEUKOCYTESUR TRACE (A) 02/05/2017 0620   Sepsis Labs: @LABRCNTIP (procalcitonin:4,lacticidven:4)  ) Recent Results (from the past 240 hour(s))  Urine culture     Status: Abnormal (Preliminary result)   Collection Time: 02/05/17  8:32 AM  Result Value Ref Range Status   Specimen Description URINE, CLEAN CATCH  Final   Special Requests NONE  Final   Culture (A)  Final    >=100,000 COLONIES/mL ESCHERICHIA COLI SUSCEPTIBILITIES TO FOLLOW Performed at [facility], [address] [place], [place] [address]    Report Status PENDING  Incomplete  MRSA PCR Screening     Status: None   Collection Time: 02/05/17 12:05 PM  Result Value Ref Range Status   MRSA by PCR NEGATIVE NEGATIVE Final    Comment:        The GeneXpert MRSA Assay (FDA approved for NASAL specimens only), is one component of a comprehensive MRSA colonization surveillance program. It is not intended to diagnose MRSA infection nor to guide or monitor treatment for MRSA infections.          Radiology Studies: Dg Chest 2 View  Result Date: 02/06/2017 CLINICAL DATA:  Pneumothorax with rib fractures. EXAM: CHEST  2 VIEW COMPARISON:  02/05/2017 FINDINGS: Right apical pneumothorax is similar to prior. There is some atelectasis at the right lung base and probable tiny right pleural effusion. Left lung clear. The cardiopericardial silhouette is within normal limits for size. Right-sided rib fractures again noted. Telemetry leads overlie the chest. IMPRESSION: Stable small right apical pneumothorax.  Electronically Signed   By: Misty Stanley M.D.   On: 02/06/2017 09:18        Scheduled Meds: . acetaminophen  1,000 mg Oral Q6H  . aspirin EC  81 mg Oral Daily  . Chlorhexidine Gluconate Cloth  6 each Topical Daily  . enoxaparin (LOVENOX) injection  40 mg Subcutaneous Q24H  . ibuprofen  800 mg Oral Q6H  .  insulin aspart  0-15 Units Subcutaneous TID WC  . insulin aspart  0-5 Units Subcutaneous QHS  . insulin glargine  5 Units Subcutaneous Daily  . levothyroxine  88 mcg Oral QAC breakfast  . lidocaine  2 patch Transdermal Q24H  . lisinopril  5 mg Oral Daily  . multivitamin with minerals  1 tablet Oral Daily  . polyethylene glycol  17 g Oral Daily   Continuous Infusions: . sodium chloride 75 mL/hr at 02/07/17 0642  . cefTRIAXone (ROCEPHIN)  IV Stopped (02/07/17 1049)     LOS: 2 days    Time spent: 40 minutes    Sherissa Tenenbaum, Geraldo Docker, MD [facility] Pager [phone]   If 7PM-7AM, please contact night-coverage [email or web address] Password [email or web address] 02/07/2017, 2:22 PM

## 2017-02-07 NOTE — Progress Notes (Signed)
Pt. Refused miralax again today. Patient educated on the action and indications for the medication.

## 2017-02-08 DIAGNOSIS — N390 Urinary tract infection, site not specified: Secondary | ICD-10-CM

## 2017-02-08 DIAGNOSIS — IMO0002 Reserved for concepts with insufficient information to code with codable children: Secondary | ICD-10-CM

## 2017-02-08 DIAGNOSIS — E1121 Type 2 diabetes mellitus with diabetic nephropathy: Secondary | ICD-10-CM

## 2017-02-08 DIAGNOSIS — R030 Elevated blood-pressure reading, without diagnosis of hypertension: Secondary | ICD-10-CM

## 2017-02-08 DIAGNOSIS — Z1612 Extended spectrum beta lactamase (ESBL) resistance: Secondary | ICD-10-CM

## 2017-02-08 DIAGNOSIS — B9629 Other Escherichia coli [E. coli] as the cause of diseases classified elsewhere: Secondary | ICD-10-CM

## 2017-02-08 DIAGNOSIS — E1165 Type 2 diabetes mellitus with hyperglycemia: Secondary | ICD-10-CM

## 2017-02-08 DIAGNOSIS — Z794 Long term (current) use of insulin: Secondary | ICD-10-CM

## 2017-02-08 LAB — URINE CULTURE: Culture: >=100000 — AB

## 2017-02-08 LAB — GLUCOSE, CAPILLARY
GLUCOSE-CAPILLARY: 262 mg/dL — AB (ref 65–99)
Glucose-Capillary: 160 mg/dL — ABNORMAL HIGH (ref 65–99)

## 2017-02-08 MED ORDER — CEFIXIME 400 MG PO CAPS: 400.0000 mg | ORAL_CAPSULE | Freq: Every day | ORAL | 0 refills | Status: DC

## 2017-02-08 MED ORDER — METOPROLOL TARTRATE 12.5 MG HALF TABLET: 12.5000 mg | ORAL_TABLET | Freq: Two times a day (BID) | ORAL | Status: DC

## 2017-02-08 MED ORDER — HYDROCODONE-ACETAMINOPHEN 5-325 MG PO TABS: 1.0000 | ORAL_TABLET | ORAL | 0 refills | Status: DC | PRN

## 2017-02-08 MED ORDER — METOPROLOL TARTRATE 25 MG PO TABS: 12.5000 mg | ORAL_TABLET | Freq: Two times a day (BID) | ORAL | 0 refills | Status: DC

## 2017-02-08 MED ORDER — LIDOCAINE 5 % EX PTCH: 2.0000 | MEDICATED_PATCH | CUTANEOUS | 0 refills | Status: DC

## 2017-02-08 MED ORDER — LISINOPRIL 5 MG PO TABS: 5.0000 mg | ORAL_TABLET | Freq: Every day | ORAL | 0 refills | Status: DC

## 2017-02-08 MED ORDER — INSULIN GLARGINE 300 UNIT/ML ~~LOC~~ SOPN: 22.0000 [IU] | PEN_INJECTOR | Freq: Every day | SUBCUTANEOUS | 0 refills | Status: DC

## 2017-02-08 MED ORDER — ENOXAPARIN SODIUM 30 MG/0.3ML ~~LOC~~ SOLN: 30.0000 mg | SUBCUTANEOUS | Status: DC

## 2017-02-08 MED ORDER — ONDANSETRON HCL 4 MG PO TABS: 4.0000 mg | ORAL_TABLET | Freq: Four times a day (QID) | ORAL | 0 refills | Status: DC | PRN

## 2017-02-08 MED ORDER — ATORVASTATIN CALCIUM 20 MG PO TABS: 20.0000 mg | ORAL_TABLET | Freq: Every day | ORAL | 0 refills | Status: DC

## 2017-02-08 MED ORDER — INSULIN GLARGINE 100 UNIT/ML ~~LOC~~ SOLN: 10.0000 [IU] | Freq: Every day | SUBCUTANEOUS | Status: DC

## 2017-02-08 MED ORDER — POLYETHYLENE GLYCOL 3350 17 G PO PACK: 17.0000 g | PACK | Freq: Every day | ORAL | 0 refills | Status: DC | PRN

## 2017-02-08 NOTE — Evaluation (Signed)
Occupational Therapy Evaluation and Discharge Patient Details Name: Danielle [name] MRN: [id number] DOB: 12/14/1931 Today's Date: 02/08/2017    History of Present Illness Steele Ledonne  is a 83 y.o. female, History of diabetes mellitus on insulin, low back pain presented from home with possibly a mechanical fall. Pt with R rib fxs 2-7, R clavicular fx, and pneumothorax   Clinical Impression   PTA Pt independent in ADL and mobility. Pt is currently mod to max A for upper body ADL and min A for LB ADL. Pt is right hand dominant, so NWB status and immobilization in the sling impacts her ease of performing ADL, but she is compensating well with assist from husband and LUE. Pt is supervision for mobility this session with no DME. Pt able to get dressed for home with assist from OT, in addition to using the bathroom and washing hands. During session Pt educated on WB status, compensatory and safety strategies, and fall prevention in the home. Pt and Husband verbalize understanding and had no questions at the end of the session. OT education complete. Continue therapy as ordered by MD once WB status has changed and RUE can progress to strengthening and ROM.     Follow Up Recommendations  DC plan and follow up therapy as arranged by surgeon;Supervision/Assistance - 24 hour    Equipment Recommendations  None recommended by OT (Pt has appropriate DME)    Recommendations for Other Services       Precautions / Restrictions Precautions Precautions: Fall Required Braces or Orthoses: Sling Restrictions Weight Bearing Restrictions: Yes RUE Weight Bearing: Non weight bearing      Mobility Bed Mobility Overal bed mobility: Needs Assistance Bed Mobility: Rolling;Sidelying to Sit Rolling: Supervision Sidelying to sit: Supervision       General bed mobility comments: Used log roll technique onto L side to avoid painful R trunk and shoulder; cues for technique  Transfers Overall transfer  level: Needs assistance Equipment used: None Transfers: Sit to/from Stand Sit to Stand: Min guard (without physical contact)         General transfer comment: No overt difficulty    Balance Overall balance assessment: Needs assistance   Sitting balance-Leahy Scale: Good       Standing balance-Leahy Scale: Fair                             ADL either performed or assessed with clinical judgement   ADL Overall ADL's : Needs assistance/impaired Eating/Feeding: Minimal assistance;Sitting Eating/Feeding Details (indicate cue type and reason): Pt is right handed, minor assist needed for BUE tasks Grooming: Supervision/safety;Standing Grooming Details (indicate cue type and reason): Pt might need some help with BUE tasks during grooming, husband able and willing to assist Upper Body Bathing: Moderate assistance;With caregiver independent assisting   Lower Body Bathing: Modified independent;Sitting/lateral leans Lower Body Bathing Details (indicate cue type and reason): talked with Pt and husband about using 3 in 1 as shower seat for safety and a non slip mat in the base of tub Upper Body Dressing : Maximal assistance;Sitting Upper Body Dressing Details (indicate cue type and reason): dressing right arm first, donned shirt and sling - husband verbalized understanding Lower Body Dressing: Minimal assistance;Sit to/from stand Lower Body Dressing Details (indicate cue type and reason): to don underwear, pants, and slip on sandals Toilet Transfer: Supervision/safety;Ambulation   Toileting- Clothing Manipulation and Hygiene: Supervision/safety;Sit to/from stand Toileting - Clothing Manipulation Details (indicate cue type and  reason): hospital gown and peri care Tub/ Shower Transfer: Tub transfer;Min guard;With caregiver independent assisting Tub/Shower Transfer Details (indicate cue type and reason): talked about shower safety Functional mobility during ADLs:  Supervision/safety General ADL Comments: Pt and Husband verbalized understanding for compensatory strategies and safety measures     Vision Patient Visual Report: No change from baseline       Perception     Praxis      Pertinent Vitals/Pain Pain Assessment: Faces Faces Pain Scale: Hurts a little bit Pain Location: R side body and shoulder Pain Descriptors / Indicators: Aching Pain Intervention(s): Monitored during session;Repositioned (adjusted sling)     Hand Dominance Right   Extremity/Trunk Assessment Upper Extremity Assessment Upper Extremity Assessment: RUE deficits/detail RUE Deficits / Details: RUE in sling for clavicular fx RUE: Unable to fully assess due to pain;Unable to fully assess due to immobilization RUE Coordination: decreased gross motor   Lower Extremity Assessment Lower Extremity Assessment: Defer to PT evaluation   Cervical / Trunk Assessment Cervical / Trunk Assessment: Kyphotic   Communication Communication Communication: No difficulties   Cognition Arousal/Alertness: Awake/alert Behavior During Therapy: WFL for tasks assessed/performed Overall Cognitive Status: Within Functional Limits for tasks assessed                                     General Comments       Exercises     Shoulder Instructions      Home Living Family/patient expects to be discharged to:: Private residence Living Arrangements: Spouse/significant other Available Help at Discharge: Family;Available 24 hours/day Type of Home: House Home Access: Stairs to enter [employer] of Steps: 2   Home Layout: Multi-level;Able to live on main level with bedroom/bathroom     Bathroom Shower/Tub: Tub/shower unit   Bathroom Toilet: Handicapped height     Home Equipment: Environmental consultant - 2 wheels;Cane - single point;Wheelchair - manual;Bedside commode   Additional Comments: pt has fallen before but it has been several years. She does not know how she fell,  she was getting up for the day and heading to the bathroom. Lights off. Maybe caughter her leg on the trunk at the foot of the bed      Prior Functioning/Environment Level of Independence: Independent                 OT Problem List: Decreased range of motion;Impaired balance (sitting and/or standing);Decreased knowledge of use of DME or AE;Decreased knowledge of precautions;Impaired UE functional use;Pain      OT Treatment/Interventions:      OT Goals(Current goals can be found in the care plan section) Acute Rehab OT Goals Patient Stated Goal: to get home OT Goal Formulation: With patient/family Time For Goal Achievement: 02/15/17 Potential to Achieve Goals: Good  OT Frequency:     Barriers to D/C:            Co-evaluation              AM-PAC PT "6 Clicks" Daily Activity     Outcome Measure Help from another person eating meals?: A Little Help from another person taking care of personal grooming?: A Little Help from another person toileting, which includes using toliet, bedpan, or urinal?: A Little Help from another person bathing (including washing, rinsing, drying)?: A Lot Help from another person to put on and taking off regular upper body clothing?: A Lot Help from another person to put  on and taking off regular lower body clothing?: A Little 6 Click Score: 16   End of Session Equipment Utilized During Treatment:  (sling) Nurse Communication: Mobility status (dressed ready to go home)  Activity Tolerance: Patient tolerated treatment well Patient left: in bed;with call bell/phone within reach;with family/visitor present (sitting EOB)  OT Visit Diagnosis: Pain;Unsteadiness on feet (R26.81);History of falling (Z91.81) Pain - Right/Left: Right Pain - part of body: Shoulder                Time: 9532-0233 OT Time Calculation (min): 25 min Charges:  OT General Charges $OT Visit: 1 Visit OT Evaluation $OT Eval Moderate Complexity: 1 Mod OT  Treatments $Self Care/Home Management : 8-22 mins G-Codes:     Hulda Humphrey OTR/L [place] 02/08/2017, 5:05 PM

## 2017-02-08 NOTE — Progress Notes (Signed)
Received call from IV RN stating that we could take out midline IV , IV removed and measured 10cm

## 2017-02-08 NOTE — Progress Notes (Signed)
Reviewed discharge instructions with pt and spouse including new medications , when to take and reason for them being prescribed. Reviewed importance of making follow up appt with pcp.  Paged IV team to verify if I could remove midline IV or they would need to remove it, waiting for call back

## 2017-02-08 NOTE — Discharge Summary (Signed)
Physician Discharge Summary  Danielle [name] NFA:[id number] DOB: Dec 23, 1931 DOA: 02/05/2017  PCP: Sinda Du, MD  Admit date: 02/05/2017 Discharge date: 02/08/2017  Time spent: 35 minutes  Recommendations for Outpatient Follow-up:  Right apical pneumothorax -Multiple right rib fracture secondary to trauma -Has been evaluated by surgery. Injury nonsurgical, medical management. -PT/OT: Recommend Home health, PT. Further recommendation order has been placed.  -On room air without SOB/DOE stable for discharge home -Follow-up with Dr. 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 -Uncontrolled, PCP will need to titrate patient's medication for effect.   HLD -Lipid panel not within ADA guidelines -Lipitor 20 mg daily     Elevated blood pressure reading -Pain may be contributing, but given patient is a diabetic will treat. -Lisinopril 50 g daily -Metoprolol 12.5 mg BID  -PCP to titrate medication to goal.    positive Escherichia coli  UTI (urinary tract infection) -Complete five-day course antibiotics    Goals of care -PT/OT consulted:  per the recommendation home health, PT requested      Discharge Diagnoses:  Principal Problem:   Multiple rib fractures Active Problems:   Pneumothorax   Type 2 diabetes mellitus with hypoglycemia without coma (HCC)   Clavicle fracture   Elevated blood pressure reading   UTI (urinary tract infection)   Discharge Condition: Stable  Diet recommendation: Heart healthy/American diabetic Association  Filed Weights   02/05/17 0602 02/06/17 0103 02/06/17 0114  Weight: 118 lb (53.5 kg) 118 lb (53.5 kg) 118 lb (53.5 kg)     History of present illness:  83 y.o. WF PMHx  Diabetes Type 2 on Insulin, S/P Breast Enlargement   low back pain presented from home with possibly a mechanical fall.  She landed on her right side. Patient informs getting up early this morning to go to the bathroom. She denies dizziness or lightheadedness or tripping on anything. She is not sure if she passed out . Husband heard a thump and woke up from sleep. He thinks she did not lose consciousness and was morning on the floor. Denies hitting her head on any object. He checked her blood glucose which was 60.  Patient denies any headache, blurred vision, chest pain, palpitations, shortness of breath, abdominal pain, bowel or urinary symptoms. Denies any new medications, use of benzos or narcotics. Denies recent illness. Patient had severe pain in right side of her chest and her shoulder. Patient brought to the ED where she was hypertensive with blood pressure of 202/81 mmHg. Sats were 99% on 2 L. Blood work was unremarkable. X-ray of the chest and right ribs showed multiple displaced fractures involving the right second to seventh rib. CT head and cervical spine were negative for any acute injury or bleed. Patient had a CT chest with contrast again showing displaced fractures involving right lateral third through eighth rib and right posterior second through seventh ribs. She also had right midclavicular angulated fracture. Also noted on chest CT was less than 10% right apical pneumothorax along with some lung contusions around the rib fractures. Her UA was positive for UTI. Patient given 50 mg IV fentanyl and hospitalist consulted for admission. General surgery consulted as well.  During his hospital is Asian patient was found to have multiple RIGHT rib  fractures, RIGHT clavicular fracture and a small RIGHT pneumothorax secondary to trauma. Patient was evaluated by surgery and it was determined that fractures were nonsurgical in nature. Medical  management. Patient was evaluated by PT/OT and found to be safe for discharge home with home health.   Procedures: 9/20 CT head WO contrast:-Negative head CT.-Advance cervical DJD without fracture 9/20 CT chest W contrast:-Small RIGHT pneumothorax<10%.-Trace right pleural effusion -RIGHT lateral 3-8 rib fractures, posterior RIGHT 3-7 rib fractures   Consultations: [facility]   Cultures  9/20 urine positive Escherichia coli 9/20 MRSA by PCR negative   Antibiotics Anti-infectives    Start     Dose/Rate Stop   02/08/17 1000  Cefixime (SUPRAX) capsule 400 mg     400 mg     02/05/17 0930  cefTRIAXone (ROCEPHIN) 1 g in dextrose 5 % 50 mL IVPB  Status:  Discontinued     1 g 100 mL/hr over 30 Minutes 02/07/17 1952       Discharge Exam: Vitals:   02/08/17 0500 02/08/17 0600 02/08/17 0700 02/08/17 1100  BP: (!) 166/73 (!) 182/80 (!) 131/53 (!) 141/58  Pulse: 87 89 86 80  Resp: 15 12 17 19   Temp:   98.1 F (36.7 C) 98.6 F (37 C)  TempSrc:   Oral Oral  SpO2: 95% 96% 97% 97%  Weight:      Height:        General: A/O 4, negative acute respiratory distress Cardiovascular: Regular rhythm and rate, negative murmurs rubs or gallops, normal S1/S2 Respiratory: Clear to auscultation bilateral, negative wheezes or crackles    Discharge Instructions   Allergies as of 02/08/2017      Reactions   Fosamax [alendronate Sodium]       Medication List    TAKE these medications   acetaminophen 500 MG tablet Commonly known as:  TYLENOL Take 500 mg by mouth every 6 (six) hours as needed for pain.   aspirin EC 81 MG tablet Take 81 mg by mouth daily.   atorvastatin 20 MG tablet Commonly known as:  LIPITOR Take 1 tablet (20 mg total) by mouth daily at 6 PM.   Cefixime 400 MG Caps capsule Commonly known as:  SUPRAX Take 1 capsule (400 mg total) by mouth daily.   HYDROcodone-acetaminophen 5-325 MG tablet Commonly known as:  NORCO/VICODIN Take 1-2 tablets  by mouth every 4 (four) hours as needed for moderate pain.   Insulin Glargine 300 UNIT/ML Sopn Commonly known as:  TOUJEO MAX SOLOSTAR Inject 22 Units into the skin daily. What changed:  how much to take  when to take this  additional instructions   insulin lispro 100 UNIT/ML injection Commonly known as:  HUMALOG Inject 2-4 Units into the skin 2 (two) times daily. Takes 4 units in the morning and 2 units in the evening.   levothyroxine 88 MCG tablet Commonly known as:  SYNTHROID, LEVOTHROID Take 88 mcg by mouth daily before breakfast.   lidocaine 5 % Commonly known as:  LIDODERM Place 2 patches onto the skin daily. Remove & Discard patch within 12 hours or as directed by MD   lisinopril 5 MG tablet Commonly known as:  PRINIVIL,ZESTRIL Take 1 tablet (5 mg total) by mouth daily.   metoprolol tartrate 25 MG tablet Commonly known as:  LOPRESSOR Take 0.5 tablets (12.5 mg total) by mouth 2 (two) times daily.   multivitamin with minerals Tabs tablet Take 1 tablet by mouth as needed.   ondansetron 4 MG tablet Commonly  known as:  ZOFRAN Take 1 tablet (4 mg total) by mouth every 6 (six) hours as needed for nausea.   polyethylene glycol packet Commonly known as:  MIRALAX / GLYCOLAX Take 17 g by mouth daily as needed.            Discharge Care Instructions        Start     Ordered   02/09/17 0000  Cefixime (SUPRAX) 400 MG CAPS capsule  Daily     02/08/17 1431   02/09/17 0000  lidocaine (LIDODERM) 5 %  Every 24 hours     02/08/17 1431   02/09/17 0000  lisinopril (PRINIVIL,ZESTRIL) 5 MG tablet  Daily     02/08/17 1431   02/08/17 0000  Insulin Glargine (TOUJEO MAX SOLOSTAR) 300 UNIT/ML SOPN  Daily     02/08/17 1431   02/08/17 0000  atorvastatin (LIPITOR) 20 MG tablet  Daily-1800     02/08/17 1431   02/08/17 0000  HYDROcodone-acetaminophen (NORCO/VICODIN) 5-325 MG tablet  Every 4 hours PRN     02/08/17 1431   02/08/17 0000  metoprolol tartrate (LOPRESSOR) 25 MG  tablet  2 times daily     02/08/17 1431   02/08/17 0000  ondansetron (ZOFRAN) 4 MG tablet  Every 6 hours PRN     02/08/17 1431   02/08/17 0000  polyethylene glycol (MIRALAX / GLYCOLAX) packet  Daily PRN     02/08/17 1431     Allergies  Allergen Reactions  . Fosamax [Alendronate Sodium]    Follow-up Information    Sinda Du, MD. Schedule an appointment as soon as possible for a visit in 2 week(s).   Specialty:  Pulmonary Disease Why:  Follow-up with Dr. 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    Significant Diagnostic Studies: Dg Chest 2 View  Result Date: 02/06/2017 CLINICAL DATA:  Pneumothorax with rib fractures. EXAM: CHEST  2 VIEW COMPARISON:  02/05/2017 FINDINGS: Right apical pneumothorax is similar to prior. There is some atelectasis at the right lung base and probable tiny right pleural effusion. Left lung clear. The cardiopericardial silhouette is within normal limits for size. Right-sided rib fractures again noted. Telemetry leads overlie the chest. IMPRESSION: Stable small right apical pneumothorax. Electronically Signed   By: Misty Stanley M.D.   On: 02/06/2017 09:18   Dg Ribs Unilateral W/chest Right  Result Date: 02/05/2017 CLINICAL DATA:  Right chest pain after a fall. EXAM: RIGHT RIBS AND CHEST - 3+ VIEW COMPARISON:  Chest 04/04/2013 FINDINGS: Normal heart size and pulmonary vascularity. No airspace disease or consolidation in the lungs. No blunting of costophrenic angles. Tiny right apical pneumothorax. Bilateral breast implants. Calcification of the aorta. Thoracic scoliosis convex towards  the left. Right ribs demonstrate acute displaced fractures involving the lateral second, third, fourth, fifth, sixth, and seventh ribs. No expansile or destructive bone lesions. IMPRESSION: Multiple acute displaced right rib fractures with small right pneumothorax. These results were called by telephone at the time of interpretation on 02/05/2017 at 6:50 am to Dr. Ezequiel Essex , who verbally acknowledged these results. Electronically Signed   By: Lucienne Capers M.D.   On: 02/05/2017 06:52   Dg Clavicle Right  Result Date: 02/05/2017 CLINICAL DATA:  [place] this morning. Pain and swelling right clavicle. Right chest pain. EXAM: RIGHT CLAVICLE - 2+ VIEWS COMPARISON:  None. FINDINGS: Right clavicle appears intact. No evidence of acute fracture or dislocation. Coracoclavicular and acromioclavicular spaces are maintained. Multiple right rib fractures are demonstrated. Small right pneumothorax. IMPRESSION: Right clavicle appears intact. Multiple right rib fractures. Small right pneumothorax. See additional report of chest and right ribs. Electronically Signed   By: Lucienne Capers M.D.   On: 02/05/2017 06:48   Ct Head Wo Contrast  Result Date: 02/05/2017 CLINICAL DATA:  Fall.  Head injury EXAM: CT HEAD WITHOUT CONTRAST CT CERVICAL SPINE WITHOUT CONTRAST TECHNIQUE: Multidetector CT imaging of the head and cervical spine was performed following the standard protocol without intravenous contrast. Multiplanar CT image reconstructions of the cervical spine were also generated. COMPARISON:  None. FINDINGS: CT HEAD FINDINGS Brain: Ventricle size and cerebral volume normal for age. Negative for infarct, hemorrhage, or mass lesion. Vascular: Negative for hyperdense vessel Skull: Negative Sinuses/Orbits: Negative Other: None CT CERVICAL SPINE FINDINGS Alignment: Mild retrolisthesis at C3-4. 3 mm anterolisthesis C4-5. Mild retrolisthesis C6-7 Skull base and vertebrae: Negative for fracture Soft tissues and spinal canal: No  soft tissue mass. Extensive carotid artery calcification bilaterally Disc levels: Multilevel disc and facet degeneration. Partial fusion at C5-6 which may be congenital. Foraminal encroachment bilaterally due to spurring at C3-4, C4-5, C6-7. Upper chest: Negative Other: None IMPRESSION: Negative CT head Advanced cervical degenerative changes without fracture. Electronically Signed   By: Franchot Gallo M.D.   On: 02/05/2017 07:21   Ct Chest W Contrast  Result Date: 02/05/2017 CLINICAL DATA:  Fall getting up this morning EXAM: CT CHEST WITH CONTRAST TECHNIQUE: Multidetector CT imaging of the chest was performed during intravenous contrast administration. CONTRAST:  24mL ISOVUE-300 IOPAMIDOL (ISOVUE-300) INJECTION 61% COMPARISON:  None. FINDINGS: Cardiovascular: Normal heart size. No pericardial effusion. No evidence of great vessel injury. Atherosclerotic calcification of the aorta and coronaries. Aortic valve calcification. Mediastinum/Nodes: Negative for hematoma or pneumomediastinum. Negative for adenopathy or mass. Lungs/Pleura: There is a small right pneumothorax, less than 10%. Trace right pleural effusion that is water density. At least 2 subpleural ground-glass densities in the right upper lobe along rib fractures, likely mild contusion. Mild bilateral atelectatic change. There is biapical pleural based scarring, more prominent on the right due to the neighboring pneumothorax. Subpleural density along the lower right major fissure has a flat appearance, favoring atelectasis over a nodule. Upper Abdomen: No evidence of injury. Atherosclerotic calcifications. Musculoskeletal: Mid right clavicle fracture with mild apex ventral angulation. Neighboring soft tissue hematoma. Lateral right third, fourth, fifth, sixth, seventh, and eighth rib fractures. Posterior right third and seventh rib fractures. No left-sided rib fractures noted. Remote appearing L1 compression fracture with advanced height loss anteriorly  and mild retropulsion. Bilateral breast implants which are intact. IMPRESSION: 1. Small right pneumothorax, less than 10%. Trace right pleural effusion that is low-density. Few small pulmonary contusions in the right upper lobe deep to rib fractures. 2. Right lateral third through eighth rib fractures. Posterior right third and seventh rib fractures. 3. Mildly angulated right mid clavicle fracture. 4. Aortic Atherosclerosis (ICD10-I70.0).  Coronary atherosclerosis. Electronically Signed   By: Monte Fantasia M.D.   On: 02/05/2017 07:30   Ct Cervical Spine Wo Contrast  Result Date: 02/05/2017 CLINICAL DATA:  Fall.  Head injury EXAM: CT HEAD WITHOUT CONTRAST CT CERVICAL SPINE WITHOUT CONTRAST TECHNIQUE: Multidetector CT imaging of the head and cervical spine was performed  following the standard protocol without intravenous contrast. Multiplanar CT image reconstructions of the cervical spine were also generated. COMPARISON:  None. FINDINGS: CT HEAD FINDINGS Brain: Ventricle size and cerebral volume normal for age. Negative for infarct, hemorrhage, or mass lesion. Vascular: Negative for hyperdense vessel Skull: Negative Sinuses/Orbits: Negative Other: None CT CERVICAL SPINE FINDINGS Alignment: Mild retrolisthesis at C3-4. 3 mm anterolisthesis C4-5. Mild retrolisthesis C6-7 Skull base and vertebrae: Negative for fracture Soft tissues and spinal canal: No soft tissue mass. Extensive carotid artery calcification bilaterally Disc levels: Multilevel disc and facet degeneration. Partial fusion at C5-6 which may be congenital. Foraminal encroachment bilaterally due to spurring at C3-4, C4-5, C6-7. Upper chest: Negative Other: None IMPRESSION: Negative CT head Advanced cervical degenerative changes without fracture. Electronically Signed   By: Franchot Gallo M.D.   On: 02/05/2017 07:21   Dg Chest Port 1 View  Result Date: 02/05/2017 CLINICAL DATA:  Trauma, fall with multiple rib fractures. Apical pneumothorax EXAM:  PORTABLE CHEST 1 VIEW COMPARISON:  CT 02/05/2017 FINDINGS: Right side pneumothorax is again noted, stable since prior study. Small right pleural effusion. Increasing right lower lobe atelectasis or infiltrate. Mild left base atelectasis. Heart is borderline in size. IMPRESSION: Stable small right apical pneumothorax, approximately 10%. Small right pleural effusion. Increasing right lower lobe atelectasis or infiltrate. Electronically Signed   By: Rolm Baptise M.D.   On: 02/05/2017 10:24    Microbiology: Recent Results (from the past 240 hour(s))  Urine culture     Status: Abnormal   Collection Time: 02/05/17  8:32 AM  Result Value Ref Range Status   Specimen Description URINE, CLEAN CATCH  Final   Special Requests NONE  Final   Culture >=100,000 COLONIES/mL ESCHERICHIA COLI (A)  Final   Report Status 02/08/2017 FINAL  Final   Organism ID, Bacteria ESCHERICHIA COLI (A)  Final      Susceptibility   Escherichia coli - MIC*    AMPICILLIN >=32 RESISTANT Resistant     CEFAZOLIN <=4 SENSITIVE Sensitive     CEFTRIAXONE <=1 SENSITIVE Sensitive     CIPROFLOXACIN <=0.25 SENSITIVE Sensitive     GENTAMICIN <=1 SENSITIVE Sensitive     IMIPENEM <=0.25 SENSITIVE Sensitive     NITROFURANTOIN <=16 SENSITIVE Sensitive     TRIMETH/SULFA <=20 SENSITIVE Sensitive     AMPICILLIN/SULBACTAM >=32 RESISTANT Resistant     Extended ESBL NEGATIVE Sensitive     * >=100,000 COLONIES/mL ESCHERICHIA COLI  MRSA PCR Screening     Status: None   Collection Time: 02/05/17 12:05 PM  Result Value Ref Range Status   MRSA by PCR NEGATIVE NEGATIVE Final    Comment:        The GeneXpert MRSA Assay (FDA approved for NASAL specimens only), is one component of a comprehensive MRSA colonization surveillance program. It is not intended to diagnose MRSA infection nor to guide or monitor treatment for MRSA infections.      Labs: Basic Metabolic Panel:  Recent Labs Lab 02/05/17 0619 02/05/17 2232 02/06/17 0209  02/07/17 0815  NA 138  --  135 137  K 4.3  --  3.5 3.8  CL 99*  --  104 107  CO2 26  --  23 14*  GLUCOSE 93  --  165* 214*  BUN 19  --  22* 22*  CREATININE 0.78 1.23* 1.20* 1.39*  CALCIUM 9.5  --  8.4* 8.9  MG  --   --   --  1.9   Liver Function Tests: No results for  input(s): AST, ALT, ALKPHOS, BILITOT, PROT, ALBUMIN in the last 168 hours. No results for input(s): LIPASE, AMYLASE in the last 168 hours. No results for input(s): AMMONIA in the last 168 hours. CBC:  Recent Labs Lab 02/05/17 0619 02/05/17 2232 02/06/17 0209  WBC 10.0 9.3 7.7  NEUTROABS 7.7  --   --   HGB 15.6* 12.7 12.2  HCT 45.5 37.1 35.6*  MCV 92.3 90.5 90.8  PLT 226 206 192   Cardiac Enzymes:  Recent Labs Lab 02/05/17 0619  TROPONINI <0.03   BNP: BNP (last 3 results) No results for input(s): BNP in the last 8760 hours.  ProBNP (last 3 results) No results for input(s): PROBNP in the last 8760 hours.  CBG:  Recent Labs Lab 02/07/17 1205 02/07/17 1623 02/07/17 2200 02/08/17 0749 02/08/17 1151  GLUCAP 147* 179* 192* 262* 160*       Signed:  Dia Crawford, MD [facility] [phone] pager

## 2017-02-08 NOTE — Progress Notes (Signed)
Physical Therapy Treatment Patient Details Name: Danielle [name] MRN: [id number] DOB: 23-Feb-1932 Today's Date: 02/08/2017    History of Present Illness Danielle [name]  is a 83 y.o. female, History of diabetes mellitus on insulin, low back pain presented from home with possibly a mechanical fall. Pt with R rib fxs 2-7, R clavicular fx, and pneumothorax    PT Comments    Continuing work on functional mobility and activity tolerance; Walking is much better today; we practiced with and without a cane; Husband present and helpful throughout session; OK for dc home from PT standpoint    Follow Up Recommendations  Home health PT;Supervision for mobility/OOB     Equipment Recommendations  None recommended by PT    Recommendations for Other Services       Precautions / Restrictions Precautions Precautions: Fall Required Braces or Orthoses: Sling Restrictions RUE Weight Bearing: Non weight bearing    Mobility  Bed Mobility Overal bed mobility: Needs Assistance Bed Mobility: Rolling;Sidelying to Sit Rolling: Min guard Sidelying to sit: Min guard       General bed mobility comments: Used log roll technique onto L side to avoid painful R trunk and shoulder; cues for technique  Transfers Overall transfer level: Needs assistance Equipment used: None Transfers: Sit to/from Stand Sit to Stand: Min guard (without physical contact)         General transfer comment: No overt difficulty  Ambulation/Gait Ambulation/Gait assistance: Min guard Ambulation Distance (Feet): 200 Feet Assistive device: None;Straight cane (Cane in L hand) Gait Pattern/deviations: Step-through pattern;Decreased stride length   Gait velocity interpretation: <1.8 ft/sec, indicative of risk for recurrent falls General Gait Details: Less need for assist to steady; a few small losses of balance from which pt recovered without physical assist   Stairs            Wheelchair Mobility     Modified Rankin (Stroke Patients Only)       Balance Overall balance assessment: Needs assistance   Sitting balance-Leahy Scale: Good       Standing balance-Leahy Scale: Fair                              Cognition Arousal/Alertness: Awake/alert Behavior During Therapy: WFL for tasks assessed/performed Overall Cognitive Status: Within Functional Limits for tasks assessed                                        Exercises      General Comments General comments (skin integrity, edema, etc.): O2 sats remained greater than or equal to 93% throughout session; taught pt to use pillow to splint sore side during deep breathing or coughing      Pertinent Vitals/Pain Pain Assessment: Faces Faces Pain Scale: Hurts a little bit Pain Location: R side body and shoulder Pain Descriptors / Indicators: Aching Pain Intervention(s): Monitored during session    Home Living                      Prior Function            PT Goals (current goals can now be found in the care plan section) Acute Rehab PT Goals Patient Stated Goal: pt wants to go home PT Goal Formulation: With patient/family Time For Goal Achievement: 02/21/17 Potential to Achieve Goals: Good Progress towards PT goals: Progressing toward  goals    Frequency    Min 4X/week      PT Plan Current plan remains appropriate    Co-evaluation              AM-PAC PT "6 Clicks" Daily Activity  Outcome Measure  Difficulty turning over in bed (including adjusting bedclothes, sheets and blankets)?: A Little Difficulty moving from lying on back to sitting on the side of the bed? : A Lot Difficulty sitting down on and standing up from a chair with arms (e.g., wheelchair, bedside commode, etc,.)?: A Little Help needed moving to and from a bed to chair (including a wheelchair)?: None Help needed walking in hospital room?: None Help needed climbing 3-5 steps with a railing? : A  Little 6 Click Score: 19    End of Session Equipment Utilized During Treatment: Other (comment) (sling) Activity Tolerance: Patient tolerated treatment well Patient left: in chair;with call bell/phone within reach;with family/visitor present Nurse Communication: Mobility status PT Visit Diagnosis: Unsteadiness on feet (R26.81);Pain;Difficulty in walking, not elsewhere classified (R26.2) Pain - Right/Left: Right Pain - part of body: Shoulder     Time: 0354-6568 PT Time Calculation (min) (ACUTE ONLY): 30 min  Charges:  $Gait Training: 23-37 mins                    G Codes:       Roney Marion, PT  Acute Rehabilitation Services Pager [phone] Office [place] 02/08/2017, 12:26 PM

## 2017-04-25 ENCOUNTER — Emergency Department (HOSPITAL_COMMUNITY)
Admission: EM | Admit: 2017-04-25 | Discharge: 2017-04-25 | Disposition: A | Discharge status: Home / Self Care | Payer: Medicare Other | Attending: Emergency Medicine | Admitting: Emergency Medicine

## 2017-04-25 ENCOUNTER — Emergency Department (HOSPITAL_COMMUNITY): Payer: Medicare Other

## 2017-04-25 ENCOUNTER — Encounter (HOSPITAL_COMMUNITY): Payer: Self-pay | Admitting: Emergency Medicine

## 2017-04-25 ENCOUNTER — Other Ambulatory Visit: Payer: Self-pay

## 2017-04-25 DIAGNOSIS — E119 Type 2 diabetes mellitus without complications: Secondary | ICD-10-CM | POA: Diagnosis not present

## 2017-04-25 DIAGNOSIS — E039 Hypothyroidism, unspecified: Secondary | ICD-10-CM | POA: Diagnosis not present

## 2017-04-25 DIAGNOSIS — S4991XA Unspecified injury of right shoulder and upper arm, initial encounter: Secondary | ICD-10-CM | POA: Diagnosis present

## 2017-04-25 DIAGNOSIS — Y92000 Kitchen of unspecified non-institutional (private) residence as  the place of occurrence of the external cause: Secondary | ICD-10-CM | POA: Insufficient documentation

## 2017-04-25 DIAGNOSIS — Y999 Unspecified external cause status: Secondary | ICD-10-CM | POA: Diagnosis not present

## 2017-04-25 DIAGNOSIS — Y9389 Activity, other specified: Secondary | ICD-10-CM | POA: Insufficient documentation

## 2017-04-25 DIAGNOSIS — Z79899 Other long term (current) drug therapy: Secondary | ICD-10-CM | POA: Diagnosis not present

## 2017-04-25 DIAGNOSIS — W108XXA Fall (on) (from) other stairs and steps, initial encounter: Secondary | ICD-10-CM | POA: Insufficient documentation

## 2017-04-25 DIAGNOSIS — Z794 Long term (current) use of insulin: Secondary | ICD-10-CM | POA: Diagnosis not present

## 2017-04-25 DIAGNOSIS — Z7982 Long term (current) use of aspirin: Secondary | ICD-10-CM | POA: Diagnosis not present

## 2017-04-25 DIAGNOSIS — S42221A 2-part displaced fracture of surgical neck of right humerus, initial encounter for closed fracture: Secondary | ICD-10-CM | POA: Diagnosis not present

## 2017-04-25 NOTE — Discharge Instructions (Signed)
Wear your sling with the strap around your waist at all times except when bathing (be very careful to support your arm when bathing).  Use your excedrin if needed for pain.  Follow up with Dr Aline Brochure as discussed.

## 2017-04-25 NOTE — ED Provider Notes (Signed)
[facility] EMERGENCY DEPARTMENT Provider Note   CSN: [id number] Arrival date & time: 04/25/17  0759     History   Chief Complaint Chief Complaint  Patient presents with  . Shoulder Pain    HPI Danielle [name] is a 83 y.o. female who is recently recovered from multiple rib fractures secondary to a fall which prompted hospital admission 2 months ago, presenting with a fall which occurred yesterday and has had persistent pain in her right upper arm since the event.  She has 2 steps to climb to reach her kitchen, and tripped going up the steps yesterday evening, landing on her outstretched right hand.  She denies hand wrist or forearm pain, but has pain and bruising of her mid upper arm with increased pain with attempts to raise her arm over shoulder height level.  She denies weakness or numbness in her hands.  She took Tylenol prior to bed last night and reports she was fairly comfortable sleeping.  She has been using a sling which she presents with today.  The history is provided by the patient and the spouse.    Past Medical History:  Diagnosis Date  . Diabetes mellitus without complication ([place])   . Hypothyroidism (acquired)     Patient Active Problem List   Diagnosis Date Noted  . Uncontrolled type 2 diabetes mellitus with diabetic nephropathy, with long-term current use of insulin ([place])   . UTI due to extended-spectrum beta lactamase (ESBL) producing Escherichia coli   . Pneumothorax 02/05/2017  . Multiple rib fractures 02/05/2017  . Type 2 diabetes mellitus with hypoglycemia without coma ([place]) 02/05/2017  . Clavicle fracture 02/05/2017  . Elevated blood pressure reading 02/05/2017  . UTI (urinary tract infection) 02/05/2017  . Back pain at L4-L5 level 06/14/2013  . Muscle weakness (generalized) 12/17/2012  . Pain in joint, shoulder region 12/17/2012  . Shoulder fracture 12/14/2012  . Proximal humerus fracture 11/29/2012  . FRACTURE, TOE, RIGHT 09/10/2009    Past  Surgical History:  Procedure Laterality Date  . BREAST ENHANCEMENT SURGERY    . TUBAL LIGATION      OB History    Gravida Para Term Preterm AB Living             2   SAB TAB Ectopic Multiple Live Births                   Home Medications    Prior to Admission medications   Medication Sig Start Date End Date Taking? Authorizing Provider  acetaminophen (TYLENOL) 500 MG tablet Take 500 mg by mouth every 6 (six) hours as needed for pain.    [provider]  aspirin EC 81 MG tablet Take 81 mg by mouth daily.    [provider]  atorvastatin (LIPITOR) 20 MG tablet Take 1 tablet (20 mg total) by mouth daily at 6 PM. 02/08/17   Allie Bossier, MD  Cefixime (SUPRAX) 400 MG CAPS capsule Take 1 capsule (400 mg total) by mouth daily. 02/09/17   Allie Bossier, MD  HYDROcodone-acetaminophen (NORCO/VICODIN) 5-325 MG tablet Take 1-2 tablets by mouth every 4 (four) hours as needed for moderate pain. 02/08/17   Allie Bossier, MD  Insulin Glargine (TOUJEO MAX SOLOSTAR) 300 UNIT/ML SOPN Inject 22 Units into the skin daily. 02/08/17   Allie Bossier, MD  insulin lispro (HUMALOG) 100 UNIT/ML injection Inject 2-4 Units into the skin 2 (two) times daily. Takes 4 units in the morning and 2 units  in the evening.    [provider]  levothyroxine (SYNTHROID, LEVOTHROID) 88 MCG tablet Take 88 mcg by mouth daily before breakfast.    [provider]  lidocaine (LIDODERM) 5 % Place 2 patches onto the skin daily. Remove & Discard patch within 12 hours or as directed by MD 02/09/17   Allie Bossier, MD  lisinopril (PRINIVIL,ZESTRIL) 5 MG tablet Take 1 tablet (5 mg total) by mouth daily. 02/09/17   Allie Bossier, MD  metoprolol tartrate (LOPRESSOR) 25 MG tablet Take 0.5 tablets (12.5 mg total) by mouth 2 (two) times daily. 02/08/17   Allie Bossier, MD  Multiple Vitamin (MULTIVITAMIN WITH MINERALS) TABS Take 1 tablet by mouth as needed.     [provider]  ondansetron  (ZOFRAN) 4 MG tablet Take 1 tablet (4 mg total) by mouth every 6 (six) hours as needed for nausea. 02/08/17   Allie Bossier, MD  polyethylene glycol [facility] Floria Raveling) packet Take 17 g by mouth daily as needed. 02/08/17   Allie Bossier, MD    Family History No family history on file.  Social History Social History   Tobacco Use  . Smoking status: Former Research scientist (life sciences)  . Smokeless tobacco: Never Used  Substance Use Topics  . Alcohol use: No  . Drug use: No     Allergies   Fosamax [alendronate sodium]   Review of Systems Review of Systems  Constitutional: Negative for fever.  Musculoskeletal: Positive for arthralgias. Negative for joint swelling and myalgias.  Neurological: Negative for weakness and numbness.     Physical Exam Updated Vital Signs BP (!) 186/63 (BP Location: Left Arm)   Pulse 96   Temp 98.4 F (36.9 C) (Oral)   Resp 16   Ht 5\' 5"  (1.651 m)   Wt 52.8 kg (116 lb 8 oz)   SpO2 97%   BMI 19.39 kg/m   Physical Exam  Constitutional: She appears well-developed and well-nourished.  HENT:  Head: Atraumatic.  Neck: Normal range of motion.  Cardiovascular:  Pulses equal bilaterally  Musculoskeletal: She exhibits tenderness. She exhibits no deformity.       Right upper arm: She exhibits tenderness and swelling. She exhibits no deformity.  Tender to palpation mid right anterior and lateral upper arm with a line of ecchymosis present along the site of tenderness.  She does have an unusual swelling superior to the site of this bruising, however it is symmetric with her opposite arm.  Forearm flexion with and without resistance triggers pain, no pain with forearm extension.  Distal sensation is intact, radial pulses are equal and full.  She has equal grip strength.  Neurological: She is alert. She has normal strength. She displays normal reflexes. No sensory deficit.  Skin: Skin is warm and dry.  Psychiatric: She has a normal mood and affect.     ED Treatments /  Results  Labs (all labs ordered are listed, but only abnormal results are displayed) Labs Reviewed - No data to display  EKG  EKG Interpretation None       Radiology Dg Humerus Right  Result Date: 04/25/2017 CLINICAL DATA:  Pt fell up 2 steps last night. Pain in right proximal humerus. Recent hx of fall with right clavicle fx, rib fxs, pneumothorax. EXAM: RIGHT HUMERUS - 2+ VIEW COMPARISON:  Right shoulder radiographs, 02/05/2017 FINDINGS: There is a fracture of the proximal humerus. This is a transverse fracture across metaphysis. There is a secondary fracture across the base of the  greater tuberosity. There is anterior and medial displacement of the distal fracture component, approximately 1.5 cm. The glenohumeral joint remains normally aligned. There are no other fractures. The elbow joint is normally aligned. There is soft tissue swelling of the upper arm. Bones are diffusely demineralized. IMPRESSION: 1. Displaced, mildly comminuted fracture of the proximal humerus. No dislocation. Electronically Signed   By: Lajean Manes M.D.   On: 04/25/2017 09:42    Procedures Procedures (including critical care time)  Medications Ordered in ED Medications - No data to display   Initial Impression / Assessment and Plan / ED Course  I have reviewed the triage vital signs and the nursing notes.  Pertinent labs & imaging results that were available during my care of the patient were reviewed by me and considered in my medical decision making (see chart for details).     Patient discussed with Dr. Aline Brochure.  Sling and swath provided.  Close follow-up with Dr. Aline Brochure, patient has seen him in the past for other orthopedic concerns.  She will call for a follow-up appointment this week.  Return precautions discussed.  Final Clinical Impressions(s) / ED Diagnoses   Final diagnoses:  Closed 2-part displaced fracture of surgical neck of right humerus, initial encounter    ED Discharge Orders      None       Landis Martins 04/25/17 [place], [place], DO 04/25/17 1441

## 2017-04-25 NOTE — ED Notes (Signed)
Pt reports she fell yesterday and has pain in r arm and shoulder when she rotates her wrist. No obvious deformity noted, radial pulse present.

## 2017-04-25 NOTE — ED Triage Notes (Addendum)
Pt c/o of right shoulder pain. Pt fell walking up a set of stairs last night and fell on right shoulder. Bruising and swelling to right shoulder

## 2017-04-25 NOTE — ED Notes (Signed)
Refuses sling immobilizer.

## 2017-04-29 ENCOUNTER — Other Ambulatory Visit (HOSPITAL_COMMUNITY): Payer: Self-pay | Admitting: Pulmonary Disease

## 2017-04-29 DIAGNOSIS — Z780 Asymptomatic menopausal state: Secondary | ICD-10-CM

## 2017-05-01 ENCOUNTER — Ambulatory Visit (INDEPENDENT_AMBULATORY_CARE_PROVIDER_SITE_OTHER): Payer: Medicare Other

## 2017-05-01 ENCOUNTER — Encounter: Payer: Self-pay | Admitting: Orthopedic Surgery

## 2017-05-01 ENCOUNTER — Ambulatory Visit: Payer: Medicare Other | Admitting: Orthopedic Surgery

## 2017-05-01 VITALS — BP 192/80 | HR 78 | Ht 65.0 in | Wt 118.0 lb

## 2017-05-01 DIAGNOSIS — S4291XA Fracture of right shoulder girdle, part unspecified, initial encounter for closed fracture: Secondary | ICD-10-CM

## 2017-05-01 DIAGNOSIS — S4290XD Fracture of unspecified shoulder girdle, part unspecified, subsequent encounter for fracture with routine healing: Secondary | ICD-10-CM | POA: Diagnosis not present

## 2017-05-01 NOTE — Progress Notes (Signed)
NEW PATIENT OFFICE VISIT    Chief Complaint  Patient presents with  . Shoulder Pain    right / fell on 04/24/17     83 year old female with diabetes and thyroid disease just recently treated for right clavicle fracture pneumothorax and rib fracture presents now after a fall at home on 7 December.  She is now post injury day #7 she has a proximal humerus fracture she was treated with a sling  Amazingly she complains of no pain in the shoulder but does complain of mild dull pain in her arm with intermittent sharp pain in the humeral area near the biceps.  Summation location right arm painful.  Quality dull intermittent timing.  Severity mild duration 7 days associated symptoms ecchymosis and bruising right upper extremity    Review of Systems  Constitutional: Negative for chills and fever.  Musculoskeletal: Negative for joint pain.  Neurological: Negative for tingling, tremors and sensory change.     Past Medical History:  Diagnosis Date  . Diabetes mellitus without complication ([place])   . Hypothyroidism (acquired)     Past Surgical History:  Procedure Laterality Date  . BREAST ENHANCEMENT SURGERY    . TUBAL LIGATION      History reviewed. No pertinent family history. Social History   Tobacco Use  . Smoking status: Former Research scientist (life sciences)  . Smokeless tobacco: Never Used  Substance Use Topics  . Alcohol use: No  . Drug use: No      Current Meds  Medication Sig  . acetaminophen (TYLENOL) 500 MG tablet Take 500 mg by mouth every 6 (six) hours as needed for pain.  Marland Kitchen aspirin EC 81 MG tablet Take 81 mg by mouth daily.  Marland Kitchen atorvastatin (LIPITOR) 20 MG tablet Take 1 tablet (20 mg total) by mouth daily at 6 PM.  . Cefixime (SUPRAX) 400 MG CAPS capsule Take 1 capsule (400 mg total) by mouth daily.  Marland Kitchen HYDROcodone-acetaminophen (NORCO/VICODIN) 5-325 MG tablet Take 1-2 tablets by mouth every 4 (four) hours as needed for moderate pain.  . Insulin Glargine (TOUJEO MAX SOLOSTAR) 300  UNIT/ML SOPN Inject 22 Units into the skin daily.  . insulin lispro (HUMALOG) 100 UNIT/ML injection Inject 2-4 Units into the skin 2 (two) times daily. Takes 4 units in the morning and 2 units in the evening.  Marland Kitchen levothyroxine (SYNTHROID, LEVOTHROID) 88 MCG tablet Take 88 mcg by mouth daily before breakfast.  . lidocaine (LIDODERM) 5 % Place 2 patches onto the skin daily. Remove & Discard patch within 12 hours or as directed by MD  . lisinopril (PRINIVIL,ZESTRIL) 5 MG tablet Take 1 tablet (5 mg total) by mouth daily.  . metoprolol tartrate (LOPRESSOR) 25 MG tablet Take 0.5 tablets (12.5 mg total) by mouth 2 (two) times daily.  . Multiple Vitamin (MULTIVITAMIN WITH MINERALS) TABS Take 1 tablet by mouth as needed.   . ondansetron (ZOFRAN) 4 MG tablet Take 1 tablet (4 mg total) by mouth every 6 (six) hours as needed for nausea.  . polyethylene glycol (MIRALAX / GLYCOLAX) packet Take 17 g by mouth daily as needed.    BP (!) 192/80   Pulse 78   Ht 5\' 5"  (1.651 m)   Wt 118 lb (53.5 kg)   BMI 19.64 kg/m   Physical Exam  Constitutional: She is oriented to person, place, and time. She appears well-developed and well-nourished.  HENT:  Head: Normocephalic and atraumatic.  Musculoskeletal:       Right shoulder: She exhibits decreased range of motion, tenderness,  swelling, pain and spasm. She exhibits no bony tenderness, no effusion, no crepitus, no deformity, no laceration, normal pulse and normal strength.       Arms: Neurological: She is alert and oriented to person, place, and time. She displays normal reflexes. No sensory deficit. She exhibits normal muscle tone. Coordination normal.  Skin: Skin is warm and dry. Capillary refill takes less than 2 seconds.  Right upper extremity ecchymosis  Psychiatric: She has a normal mood and affect. Her behavior is normal. Judgment and thought content normal.  Nursing note and vitals reviewed.   Ortho Exam   Initial films show a displaced proximal right  humeral fracture with 50% shaft with displacement on initial AP view  New plain films were obtained and compared to the original x-ray.  The x-rays we have today a little bit better in terms of positioning.  There is a 50% displacement of the shaft and head with what appears to be an intact head.  The lateral view shows complete displacement of the shaft and head  CT scan will be required to evaluate the head and then prepare for surgical treatment with either hemiprosthesis, internal fixation: Plate or nail.  Based on the patient's age and medical history probably will need a hemiprosthesis  Encounter Diagnosis  Name Primary?  . Closed fracture of shoulder, unspecified laterality, with routine healing, subsequent encounter Yes     PLAN:   CT scan right shoulder evaluate humeral head and then revisit surgical intervention which is most likely going to be needed

## 2017-05-06 ENCOUNTER — Ambulatory Visit (HOSPITAL_COMMUNITY)
Admission: RE | Admit: 2017-05-06 | Discharge: 2017-05-06 | Disposition: A | Discharge status: Home / Self Care | Payer: Medicare Other | Source: Ambulatory Visit | Attending: Orthopedic Surgery | Admitting: Orthopedic Surgery

## 2017-05-06 DIAGNOSIS — S42254A Nondisplaced fracture of greater tuberosity of right humerus, initial encounter for closed fracture: Secondary | ICD-10-CM | POA: Insufficient documentation

## 2017-05-06 DIAGNOSIS — S42211A Unspecified displaced fracture of surgical neck of right humerus, initial encounter for closed fracture: Secondary | ICD-10-CM | POA: Diagnosis not present

## 2017-05-06 DIAGNOSIS — S4291XA Fracture of right shoulder girdle, part unspecified, initial encounter for closed fracture: Secondary | ICD-10-CM | POA: Diagnosis present

## 2017-05-06 DIAGNOSIS — X58XXXA Exposure to other specified factors, initial encounter: Secondary | ICD-10-CM | POA: Insufficient documentation

## 2017-05-07 ENCOUNTER — Other Ambulatory Visit: Payer: Self-pay | Admitting: Orthopedic Surgery

## 2017-05-07 DIAGNOSIS — S42291D Other displaced fracture of upper end of right humerus, subsequent encounter for fracture with routine healing: Secondary | ICD-10-CM

## 2017-05-07 NOTE — Progress Notes (Signed)
Referral made to

## 2017-05-08 ENCOUNTER — Ambulatory Visit: Payer: Medicare Other | Admitting: Orthopedic Surgery

## 2017-05-08 ENCOUNTER — Ambulatory Visit (INDEPENDENT_AMBULATORY_CARE_PROVIDER_SITE_OTHER): Payer: Medicare Other | Admitting: Orthopedic Surgery

## 2017-05-08 VITALS — BP 158/96 | HR 76 | Ht 64.0 in | Wt 118.0 lb

## 2017-05-08 DIAGNOSIS — S4290XD Fracture of unspecified shoulder girdle, part unspecified, subsequent encounter for fracture with routine healing: Secondary | ICD-10-CM | POA: Diagnosis not present

## 2017-05-08 NOTE — Progress Notes (Addendum)
Chief Complaint  Patient presents with  . Follow-up    Recheck on right shoulder, CT scan results.   History of right proximal humerus fracture   83 year old female came in for results of her CT scan.  I talked to Dr. Marlou Sa she has an appointment for January 9    BP (!) 158/96   Pulse 76   Ht 5\' 4"  (1.626 m)   Wt 118 lb (53.5 kg)   BMI 20.25 kg/m  she is still neurovascularly intact she has minimal discomfort taking ibuprofen she will follow with him for surgery on 9 January  Encounter Diagnosis  Name Primary?  . Closed fracture of shoulder, unspecified laterality, with routine healing, subsequent encounter Yes

## 2017-05-13 ENCOUNTER — Ambulatory Visit (INDEPENDENT_AMBULATORY_CARE_PROVIDER_SITE_OTHER): Payer: Medicare Other | Admitting: Orthopedic Surgery

## 2017-05-13 ENCOUNTER — Encounter (INDEPENDENT_AMBULATORY_CARE_PROVIDER_SITE_OTHER): Payer: Self-pay | Admitting: Orthopedic Surgery

## 2017-05-13 DIAGNOSIS — S42291A Other displaced fracture of upper end of right humerus, initial encounter for closed fracture: Secondary | ICD-10-CM | POA: Diagnosis not present

## 2017-05-14 NOTE — Progress Notes (Signed)
Office Visit Note   Patient: Danielle [name]           Date of Birth: [birth year]           MRN: [id number] Visit Date: 05/13/2017 Requested by: Sinda Du, [address] [place], [place] [address] PCP: Sinda Du, MD  Subjective: Chief Complaint  Patient presents with  . Right Shoulder - Pain    HPI: Patient is an active 83 year old female who was walking up the steps 3 weeks ago when she fell and injured her shoulder.  Ribs 3 months ago.  These resolved.  Patient reports pain in the shoulder along with limited function.  She does have a history of diabetes.  She has bone density scheduled for January 7.  She has been wearing a sling.  The scan which is reviewed today.  She is here with her husband today.  She denies any other orthopedic complaints.              ROS: All systems reviewed are negative as they relate to the chief complaint within the history of present illness.  Patient denies  fevers or chills.   Assessment & Plan: Visit Diagnoses:  1. Other closed displaced fracture of proximal end of right humerus, initial encounter     Plan: Pression is displaced proximal humerus fracture with no evidence of healing on physical examination today.  We discussed operative and nonoperative treatment options.  Operative treatment option would be reverse shoulder replacement.  The risk and benefits of that procedure are discussed including but not limited to infection nerve and vessel damage along with dislocation.  Nonoperative treatment options include sling immobilization; however, she has not had any evidence of bony union at this time and I think that her functional outcome would be less than ideal with that path.  The patient would like to pursue operative treatment at this time which I think would be her best hope for a functional arm.  Her osteoporosis and diabetes are complicating factors in this decision-making process.  Patient understands the risk  benefits and expected rehabilitative course.  All questions answered.  Follow-Up Instructions: No Follow-up on file.   Orders:  No orders of the defined types were placed in this encounter.  No orders of the defined types were placed in this encounter.     Procedures: No procedures performed   Clinical Data: No additional findings.  Objective: Vital Signs: There were no vitals taken for this visit.  Physical Exam:   Constitutional: Patient appears well-developed HEENT:  Head: Normocephalic Eyes:EOM are normal Neck: Normal range of motion Cardiovascular: Normal rate Pulmonary/chest: Effort normal Neurologic: Patient is alert Skin: Skin is warm Psychiatric: Patient has normal mood and affect    Ortho Exam: Orthopedic exam demonstrates coarse grinding with passive range of motion of that right shoulder.  No evidence of healing of the fracture fragments at this time.  Forward flexion and abduction is limited to less than 30 degrees.  Motor sensory function to the hand is intact in the radial pulses intact.  Deltoid also fires on the right-hand side.  Neck range of motion is reasonable.  Specialty Comments:  No specialty comments available.  Imaging: No results found.   PMFS History: Patient Active Problem List   Diagnosis Date Noted  . Uncontrolled type 2 diabetes mellitus with diabetic nephropathy, with long-term current use of insulin ([place])   . UTI due to extended-spectrum beta lactamase (ESBL) producing Escherichia coli   .  Pneumothorax 02/05/2017  . Multiple rib fractures 02/05/2017  . Type 2 diabetes mellitus with hypoglycemia without coma ([place]) 02/05/2017  . Clavicle fracture 02/05/2017  . Elevated blood pressure reading 02/05/2017  . UTI (urinary tract infection) 02/05/2017  . Back pain at L4-L5 level 06/14/2013  . Muscle weakness (generalized) 12/17/2012  . Pain in joint, shoulder region 12/17/2012  . Shoulder fracture 12/14/2012  . Proximal humerus  fracture 11/29/2012  . FRACTURE, TOE, RIGHT 09/10/2009   Past Medical History:  Diagnosis Date  . Diabetes mellitus without complication ([place])   . Hypothyroidism (acquired)     History reviewed. No pertinent family history.  Past Surgical History:  Procedure Laterality Date  . BREAST ENHANCEMENT SURGERY    . TUBAL LIGATION     Social History   Occupational History  . Not on file  Tobacco Use  . Smoking status: Former Research scientist (life sciences)  . Smokeless tobacco: Never Used  Substance and Sexual Activity  . Alcohol use: No  . Drug use: No  . Sexual activity: Not on file

## 2017-05-19 DIAGNOSIS — I639 Cerebral infarction, unspecified: Secondary | ICD-10-CM

## 2017-05-19 HISTORY — DX: Cerebral infarction, unspecified: I63.9

## 2017-05-20 ENCOUNTER — Other Ambulatory Visit (INDEPENDENT_AMBULATORY_CARE_PROVIDER_SITE_OTHER): Payer: Self-pay | Admitting: Orthopedic Surgery

## 2017-05-20 DIAGNOSIS — S42291A Other displaced fracture of upper end of right humerus, initial encounter for closed fracture: Secondary | ICD-10-CM

## 2017-05-21 NOTE — Pre-Procedure Instructions (Signed)
    Danielle [name]  05/21/2017      [id number] - [place], [place] [address] [place] [address] Phone: [phone] Fax: [phone]    Your procedure is scheduled on January 8th, 2019.  Report to [facility] Admitting at 07:30 A.M.  Call this number if you have problems the morning of surgery:  [phone]   Remember:  Do not eat food or drink liquids after midnight.  Take these medicines the morning of surgery with A SIP OF WATER: Levothyroxine, and ibuprofen as needed.   Do not wear jewelry, make-up or nail polish.  Do not wear lotions, powders, or perfumes, or deodorant.  Do not shave 48 hours prior to surgery.  Men may shave face and neck.  Do not bring valuables to the hospital.  [facility] is not responsible for any belongings or valuables.  Contacts, dentures or bridgework may not be worn into surgery.  Leave your suitcase in the car.  After surgery it may be brought to your room.  For patients admitted to the hospital, discharge time will be determined by your treatment team.  Patients discharged the day of surgery will not be allowed to drive home.    [place]- Preparing For Surgery  Before surgery, you can play an important role. Because skin is not sterile, your skin needs to be as free of germs as possible. You can reduce the number of germs on your skin by washing with CHG (chlorahexidine gluconate) Soap before surgery.  CHG is an antiseptic cleaner which kills germs and bonds with the skin to continue killing germs even after washing.  Please do not use if you have an allergy to CHG or antibacterial soaps. If your skin becomes reddened/irritated stop using the CHG.  Do not shave (including legs and underarms) for at least 48 hours prior to first CHG shower. It is OK to shave your face.  Please follow these instructions carefully.   1. Shower the NIGHT BEFORE SURGERY and the MORNING OF  SURGERY with CHG.   2. If you chose to wash your hair, wash your hair first as usual with your normal shampoo.  3. After you shampoo, rinse your hair and body thoroughly to remove the shampoo.  4. Use CHG as you would any other liquid soap. You can apply CHG directly to the skin and wash gently with a scrungie or a clean washcloth.   5. Apply the CHG Soap to your body ONLY FROM THE NECK DOWN.  Do not use on open wounds or open sores. Avoid contact with your eyes, ears, mouth and genitals (private parts). Wash Face and genitals (private parts)  with your normal soap.  6. Wash thoroughly, paying special attention to the area where your surgery will be performed.  7. Thoroughly rinse your body with warm water from the neck down.  8. DO NOT shower/wash with your normal soap after using and rinsing off the CHG Soap.  9. Pat yourself dry with a CLEAN TOWEL.  10. Wear CLEAN PAJAMAS to bed the night before surgery, wear comfortable clothes the morning of surgery  11. Place CLEAN SHEETS on your bed the night of your first shower and DO NOT SLEEP WITH PETS.    Day of Surgery: Do not apply any deodorants/lotions. Please wear clean clothes to the hospital/surgery center.

## 2017-05-22 ENCOUNTER — Other Ambulatory Visit: Payer: Self-pay

## 2017-05-22 ENCOUNTER — Encounter (HOSPITAL_COMMUNITY)
Admission: RE | Admit: 2017-05-22 | Discharge: 2017-05-22 | Disposition: A | Discharge status: Home / Self Care | Payer: Medicare Other | Source: Ambulatory Visit | Attending: Orthopedic Surgery | Admitting: Orthopedic Surgery

## 2017-05-22 ENCOUNTER — Encounter (HOSPITAL_COMMUNITY): Payer: Self-pay

## 2017-05-22 ENCOUNTER — Other Ambulatory Visit (INDEPENDENT_AMBULATORY_CARE_PROVIDER_SITE_OTHER): Payer: Self-pay | Admitting: Orthopedic Surgery

## 2017-05-22 DIAGNOSIS — Z01812 Encounter for preprocedural laboratory examination: Secondary | ICD-10-CM | POA: Insufficient documentation

## 2017-05-22 HISTORY — DX: Unspecified osteoarthritis, unspecified site: M19.90

## 2017-05-22 HISTORY — DX: Pneumothorax, unspecified: J93.9

## 2017-05-22 HISTORY — DX: Essential (primary) hypertension: I10

## 2017-05-22 HISTORY — DX: Fracture of one rib, unspecified side, initial encounter for closed fracture: S22.39XA

## 2017-05-22 LAB — URINALYSIS, ROUTINE W REFLEX MICROSCOPIC
BILIRUBIN URINE: NEGATIVE
Glucose, UA: >=500 mg/dL — AB
Hgb urine dipstick: NEGATIVE
KETONES UR: 5 mg/dL — AB
Nitrite: NEGATIVE
PROTEIN: NEGATIVE mg/dL
Specific Gravity, Urine: 1.027 (ref 1.005–1.030)
pH: 5 (ref 5.0–8.0)

## 2017-05-22 LAB — BASIC METABOLIC PANEL
ANION GAP: 10 (ref 5–15)
BUN: 20 mg/dL (ref 6–20)
CALCIUM: 9.4 mg/dL (ref 8.9–10.3)
CO2: 24 mmol/L (ref 22–32)
CREATININE: 0.85 mg/dL (ref 0.44–1.00)
Chloride: 101 mmol/L (ref 101–111)
GFR calc Af Amer: >60 mL/min (ref >60)
GFR calc non Af Amer: >60 mL/min (ref >60)
GLUCOSE: 341 mg/dL — AB (ref 65–99)
Potassium: 3.7 mmol/L (ref 3.5–5.1)
Sodium: 135 mmol/L (ref 135–145)

## 2017-05-22 LAB — TYPE AND SCREEN
ABO/RH(D): A POS
Antibody Screen: NEGATIVE

## 2017-05-22 LAB — CBC
HCT: 39.5 % (ref 36.0–46.0)
HEMOGLOBIN: 13 g/dL (ref 12.0–15.0)
MCH: 31.2 pg (ref 26.0–34.0)
MCHC: 32.9 g/dL (ref 30.0–36.0)
MCV: 94.7 fL (ref 78.0–100.0)
PLATELETS: 295 10*3/uL (ref 150–400)
RBC: 4.17 MIL/uL (ref 3.87–5.11)
RDW: 13.6 % (ref 11.5–15.5)
WBC: 8.2 10*3/uL (ref 4.0–10.5)

## 2017-05-22 LAB — SURGICAL PCR SCREEN
MRSA, PCR: NEGATIVE
Staphylococcus aureus: POSITIVE — AB

## 2017-05-22 LAB — HEMOGLOBIN A1C
HEMOGLOBIN A1C: 8.2 % — AB (ref 4.8–5.6)
Mean Plasma Glucose: 188.64 mg/dL

## 2017-05-22 LAB — ABO/RH: ABO/RH(D): A POS

## 2017-05-22 LAB — GLUCOSE, CAPILLARY: GLUCOSE-CAPILLARY: 280 mg/dL — AB (ref 65–99)

## 2017-05-22 NOTE — Progress Notes (Addendum)
PCP: Dr. Sinda Du  Cardiologist: pt denies  EKG: 02/05/17 in [facility]  Stress test: 5+ years ago  ECHO: pt denies ever  Cardiac Cath: pt denies ever  Chest x-ray: 02/06/17 in [facility]

## 2017-05-22 NOTE — Progress Notes (Signed)
Danielle [name]            05/21/2017                          [id number] - [place], [place] AT [address] [place] [address] Phone: [phone] Fax: [phone]              Your procedure is scheduled on January 8th, 2019.            Report to [facility] Admitting at 530 AM.            Call this number if you have problems the morning of surgery:            [phone]             Remember:            Do not eat food or drink liquids after midnight.            Take these medicines the morning of surgery with A SIP OF WATER: Levothyroxine 9synthroid).  7 days prior to surgery STOP taking any Aspirin (unless otherwise instructed by your surgeon), Aleve, Naproxen, Ibuprofen, Motrin, Advil, Goody's, BC's, all herbal medications, fish oil, and all vitamins  Continue all other medications as instructed by your physician except follow the above medication instructions before surgery  WHAT DO I DO ABOUT MY DIABETES MEDICATION?  Marland Kitchen Do not take oral diabetes medicines (pills) the morning of surgery.  . THE NIGHT BEFORE SURGERY, take 9-11 (1/2 of your normal dose units of Toujeo insulin and no bedtime dose of humalog insulin      . THE MORNING OF SURGERY, take 9-11 (1/2 of your normal dose units of Toujeo insulin and no humalog insulin unless your CBG is greater than 220 mg/dL, you may take  of your sliding scale (correction) dose of insulin.  . The day of surgery, do not take other diabetes injectables, including Byetta (exenatide), Bydureon (exenatide ER), Victoza (liraglutide), or Trulicity (dulaglutide).  . If your CBG is greater than 220 mg/dL, you may take  of your sliding scale (correction) dose of insulin.  How to Manage Your Diabetes Before and After Surgery  Why is it important to control my blood sugar before and after surgery? . Improving blood sugar levels before and after surgery helps healing and can  limit problems. . A way of improving blood sugar control is eating a healthy diet by: o  Eating less sugar and carbohydrates o  Increasing activity/exercise o  Talking with your doctor about reaching your blood sugar goals . High blood sugars (greater than 180 mg/dL) can raise your risk of infections and slow your recovery, so you will need to focus on controlling your diabetes during the weeks before surgery. . Make sure that the doctor who takes care of your diabetes knows about your planned surgery including the date and location.  How do I manage my blood sugar before surgery? . Check your blood sugar at least 4 times a day, starting 2 days before surgery, to make sure that the level is not too high or low. o Check your blood sugar the morning of your surgery when you wake up and every 2 hours until you get to the Short Stay unit. . If your blood sugar is less than 70 mg/dL, you will need to treat for low blood sugar: o Do not  take insulin. o Treat a low blood sugar (less than 70 mg/dL) with  cup of clear juice (cranberry or apple), 4 glucose tablets, OR glucose gel. Recheck blood sugar in 15 minutes after treatment (to make sure it is greater than 70 mg/dL). If your blood sugar is not greater than 70 mg/dL on recheck, call [phone] o  for further instructions. . Report your blood sugar to the short stay nurse when you get to Short Stay.  . If you are admitted to the hospital after surgery: o Your blood sugar will be checked by the staff and you will probably be given insulin after surgery (instead of oral diabetes medicines) to make sure you have good blood sugar levels. o The goal for blood sugar control after surgery is 80-180 mg/dL.  Reviewed and Endorsed by [facility] Patient Education Committee, August 2015             Do not wear jewelry, make-up or nail polish.            Do not wear lotions, powders, or perfumes, or deodorant.            Do not shave 48 hours prior to  surgery.              Do not bring valuables to the hospital.            [facility] is not responsible for any belongings or valuables.  Contacts, dentures or bridgework may not be worn into surgery.  Leave your suitcase in the car.  After surgery it may be brought to your room.  For patients admitted to the hospital, discharge time will be determined by your treatment team.  Patients discharged the day of surgery will not be allowed to drive home.    [place]- Preparing For Surgery  Before surgery, you can play an important role. Because skin is not sterile, your skin needs to be as free of germs as possible. You can reduce the number of germs on your skin by washing with CHG (chlorahexidine gluconate) Soap before surgery.  CHG is an antiseptic cleaner which kills germs and bonds with the skin to continue killing germs even after washing.  Please do not use if you have an allergy to CHG or antibacterial soaps. If your skin becomes reddened/irritated stop using the CHG.  Do not shave (including legs and underarms) for at least 48 hours prior to first CHG shower. It is OK to shave your face.  Please follow these instructions carefully.                                                                                                                     1. Shower the NIGHT BEFORE SURGERY and the MORNING OF SURGERY with CHG.   2. If you chose to wash your hair, wash your hair first as usual with your normal shampoo.  3. After you shampoo, rinse your hair and body thoroughly to remove the shampoo.  4.  Use CHG as you would any other liquid soap. You can apply CHG directly to the skin and wash gently with a scrungie or a clean washcloth.   5. Apply the CHG Soap to your body ONLY FROM THE NECK DOWN.  Do not use on open wounds or open sores. Avoid contact with your eyes, ears, mouth and genitals (private parts). Wash Face and genitals (private parts)  with your normal  soap.  6. Wash thoroughly, paying special attention to the area where your surgery will be performed.  7. Thoroughly rinse your body with warm water from the neck down.  8. DO NOT shower/wash with your normal soap after using and rinsing off the CHG Soap.  9. Pat yourself dry with a CLEAN TOWEL.  10. Wear CLEAN PAJAMAS to bed the night before surgery, wear comfortable clothes the morning of surgery  11. Place CLEAN SHEETS on your bed the night of your first shower and DO NOT SLEEP WITH PETS.    Day of Surgery: Do not apply any deodorants/lotions. Please wear clean clothes to the hospital/surgery center.

## 2017-05-23 LAB — URINE CULTURE: Culture: NO GROWTH

## 2017-05-25 ENCOUNTER — Other Ambulatory Visit (INDEPENDENT_AMBULATORY_CARE_PROVIDER_SITE_OTHER): Payer: Self-pay | Admitting: Orthopedic Surgery

## 2017-05-25 ENCOUNTER — Other Ambulatory Visit (INDEPENDENT_AMBULATORY_CARE_PROVIDER_SITE_OTHER): Payer: Self-pay

## 2017-05-25 ENCOUNTER — Other Ambulatory Visit (HOSPITAL_COMMUNITY): Payer: Medicare Other

## 2017-05-25 MED ORDER — MUPIROCIN 2 % EX OINT: 1.0000 "application " | TOPICAL_OINTMENT | Freq: Two times a day (BID) | CUTANEOUS | 0 refills | Status: DC

## 2017-05-25 NOTE — Progress Notes (Signed)
Anesthesia Chart Review: Patient is a 84 year old female scheduled for right shoulder reverse shoulder replacement on 05/26/17 by Dr. Meredith Pel. Dx: Right proximal humerus fracture. She fell on 04/24/17 and sustained a displaced, mildly comminuted fracture of the proximal humerus.   History includes former smoker, hypothyroidism, DM (insulin dependent), HTN (she was started on medication during 01/2017 admission for rib/clavicular fracture), but currently not taking), arthritis, breast augmentation, fall with right clavicular and right rib fractures and right pneumothorax (apical, 10%) 02/05/17.   PCP is Dr. Sinda Du. Endocrinologist is Dr. Burnard Bunting.   Meds include ASA 81 mg (on hold), Lipitor (not taking), Toujeo, Humalog, levothyroxine, lisinopril (not taking), Lopressor (not taking). She reports her physicians are currently just watching her BP. Her BP tends to be elevated when she arrives to doctors appointments, but better by the time she leaves.   BP (!) 142/76 Comment: rechecked manually by EF, RN  Pulse 80   Temp 36.5 C   Resp 18   Ht 5' 1.5" (1.562 m)   Wt 117 lb 8 oz (53.3 kg)   SpO2 98%   BMI 21.84 kg/m  182/78, recheck 142/76  EKG 02/05/17: SR, multiple PVCs, first degree AV block, biatrial enlargement, LVH with IVCD, LAD and secondary repolarization abnormality. PVCs are new when compared to 11/27/12 tracing.   She reports a stress test ~ 10 years ago. She does not recall why it was ordered or where it was done. She said that ~ 20 years ago, had a provider tell her she had a murmur, but no testing recommended and no mention of it by other more recent providers. She denied chest pain, SOB, edema, palpitations, syncope, known CAD/MI/CHF history. She used to walk/run for exercise, but unable for the past few years after she hurt her back. She is still able to due house cleaning (dusting, vacuuming, sweeping, etc) and able to walk up stairs at home (12 stairs) without CV  symptoms.    CXR 02/06/17: FINDINGS: Right apical pneumothorax is similar to prior [< 10% 02/05/17]. There is some atelectasis at the right lung base and probable tiny right pleural effusion. Left lung clear. The cardiopericardial silhouette is within normal limits for size. Right-sided rib fractures again noted. Telemetry leads overlie the chest. IMPRESSION: Stable small right apical pneumothorax.  Chest CT 02/05/17: IMPRESSION: 1. Small right pneumothorax, less than 10%. Trace right pleural effusion that is low-density. Few small pulmonary contusions in the right upper lobe deep to rib fractures. 2. Right lateral third through eighth rib fractures. Posterior right third and seventh rib fractures. 3. Mildly angulated right mid clavicle fracture. 4. Aortic Atherosclerosis (ICD10-I70.0).  Coronary atherosclerosis.  Preoperative labs noted. Cr 0.85. Random glucose 341 (had eaten recently). A1c 8.2, up from 7.5 on 02/07/17. CBC WNL. T&S done. UA/microscopic hazy, > 500, trace leukocytes, negative nitrites, rare bacteria, 0-5. She reports CBGs have been a little higher since her shoulder injury--just after was having sugars of 180-200, but have been improving some. This morning fasting CBG 78. Reports morning CBGs run < 200.     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 Patient with prior stress test ~ 10 years ago, but patient unable to provide any details. She is asymptomatic from a CV standpoint. She is able to do housework as outlined above. She reports BP being monitored for now, but not uncommon for initial BP to be elevated then improves. Reports fasting CBGs < 200. I have notified Sherrie at  Dr. Randel Pigg office of elevated BP (no meds) and A1c > 8%. I also reviewed above with anesthesiologist Dr. Nolon Nations. Patient with shoulder fracture last month and now for shoulder surgery. Based on stable EKG > 4 years, reported activity tolerance, and lack of CV symptoms, it is anticipated  that she can proceed as planned--pending BP and CBG acceptable on arrival. Anesthesiologist to evaluate on the day of surgery.  George Hugh [facility] Short Stay Center/Anesthesiology Phone [phone] 05/25/2017 2:59 PM

## 2017-05-26 ENCOUNTER — Inpatient Hospital Stay (HOSPITAL_COMMUNITY): Payer: Medicare Other | Admitting: Vascular Surgery

## 2017-05-26 ENCOUNTER — Inpatient Hospital Stay (HOSPITAL_COMMUNITY)
Admission: RE | Admit: 2017-05-26 | Discharge: 2017-05-29 | DRG: 483 | Disposition: A | Discharge status: Home Health | Payer: Medicare Other | Source: Ambulatory Visit | Attending: Orthopedic Surgery | Admitting: Orthopedic Surgery

## 2017-05-26 ENCOUNTER — Inpatient Hospital Stay (HOSPITAL_COMMUNITY): Payer: Medicare Other

## 2017-05-26 ENCOUNTER — Encounter (HOSPITAL_COMMUNITY): Payer: Self-pay | Admitting: Surgery

## 2017-05-26 ENCOUNTER — Encounter (HOSPITAL_COMMUNITY)
Admission: RE | Disposition: A | Discharge status: Home Health | Payer: Self-pay | Source: Ambulatory Visit | Attending: Orthopedic Surgery

## 2017-05-26 ENCOUNTER — Other Ambulatory Visit: Payer: Self-pay

## 2017-05-26 ENCOUNTER — Inpatient Hospital Stay (HOSPITAL_COMMUNITY): Payer: Medicare Other | Admitting: Certified Registered Nurse Anesthetist

## 2017-05-26 DIAGNOSIS — Z87891 Personal history of nicotine dependence: Secondary | ICD-10-CM

## 2017-05-26 DIAGNOSIS — S42291A Other displaced fracture of upper end of right humerus, initial encounter for closed fracture: Secondary | ICD-10-CM

## 2017-05-26 DIAGNOSIS — S42201A Unspecified fracture of upper end of right humerus, initial encounter for closed fracture: Secondary | ICD-10-CM | POA: Diagnosis present

## 2017-05-26 DIAGNOSIS — Z7982 Long term (current) use of aspirin: Secondary | ICD-10-CM

## 2017-05-26 DIAGNOSIS — I10 Essential (primary) hypertension: Secondary | ICD-10-CM | POA: Diagnosis present

## 2017-05-26 DIAGNOSIS — M810 Age-related osteoporosis without current pathological fracture: Secondary | ICD-10-CM | POA: Diagnosis present

## 2017-05-26 DIAGNOSIS — E119 Type 2 diabetes mellitus without complications: Secondary | ICD-10-CM | POA: Diagnosis present

## 2017-05-26 DIAGNOSIS — Z888 Allergy status to other drugs, medicaments and biological substances status: Secondary | ICD-10-CM | POA: Diagnosis not present

## 2017-05-26 DIAGNOSIS — Z794 Long term (current) use of insulin: Secondary | ICD-10-CM | POA: Diagnosis not present

## 2017-05-26 DIAGNOSIS — Z79899 Other long term (current) drug therapy: Secondary | ICD-10-CM | POA: Diagnosis not present

## 2017-05-26 DIAGNOSIS — Z9181 History of falling: Secondary | ICD-10-CM

## 2017-05-26 DIAGNOSIS — S42209A Unspecified fracture of upper end of unspecified humerus, initial encounter for closed fracture: Secondary | ICD-10-CM | POA: Diagnosis present

## 2017-05-26 DIAGNOSIS — E039 Hypothyroidism, unspecified: Secondary | ICD-10-CM | POA: Diagnosis present

## 2017-05-26 HISTORY — DX: Type 2 diabetes mellitus without complications: E11.9

## 2017-05-26 HISTORY — DX: Personal history of urinary calculi: Z87.442

## 2017-05-26 HISTORY — PX: REVERSE SHOULDER ARTHROPLASTY: SHX5054

## 2017-05-26 LAB — GLUCOSE, CAPILLARY
GLUCOSE-CAPILLARY: 211 mg/dL — AB (ref 65–99)
GLUCOSE-CAPILLARY: 159 mg/dL — AB (ref 65–99)
GLUCOSE-CAPILLARY: 198 mg/dL — AB (ref 65–99)
GLUCOSE-CAPILLARY: 130 mg/dL — AB (ref 65–99)
Glucose-Capillary: 214 mg/dL — ABNORMAL HIGH (ref 65–99)
Glucose-Capillary: 195 mg/dL — ABNORMAL HIGH (ref 65–99)

## 2017-05-26 SURGERY — ARTHROPLASTY, SHOULDER, TOTAL, REVERSE
Anesthesia: General | Site: Shoulder | Laterality: Right

## 2017-05-26 MED ORDER — MORPHINE SULFATE (PF) 4 MG/ML IV SOLN
2.0000 mg | INTRAVENOUS | Status: DC | PRN
  Administered 2017-05-27: 2 mg via INTRAVENOUS
  Filled 2017-05-26: qty 1

## 2017-05-26 MED ORDER — LIDOCAINE HCL (CARDIAC) 20 MG/ML IV SOLN
INTRAVENOUS | Status: DC | PRN
  Administered 2017-05-26: 40 mg via INTRAVENOUS

## 2017-05-26 MED ORDER — OCUVITE-LUTEIN PO CAPS: 1.0000 | ORAL_CAPSULE | Freq: Every day | ORAL | Status: DC

## 2017-05-26 MED ORDER — ONDANSETRON HCL 4 MG/2ML IJ SOLN: 4.0000 mg | Freq: Four times a day (QID) | INTRAMUSCULAR | Status: DC | PRN

## 2017-05-26 MED ORDER — METOCLOPRAMIDE HCL 5 MG PO TABS: 5.0000 mg | ORAL_TABLET | Freq: Three times a day (TID) | ORAL | Status: DC | PRN

## 2017-05-26 MED ORDER — VANCOMYCIN HCL 500 MG IV SOLR
INTRAVENOUS | Status: DC | PRN
  Administered 2017-05-26: 500 mg

## 2017-05-26 MED ORDER — PHENYLEPHRINE HCL 10 MG/ML IJ SOLN
INTRAMUSCULAR | Status: AC
  Filled 2017-05-26: qty 1

## 2017-05-26 MED ORDER — VITAMIN B-12 1000 MCG PO TABS
1000.0000 ug | ORAL_TABLET | Freq: Every day | ORAL | Status: DC
  Administered 2017-05-28 – 2017-05-29 (×2): 1000 ug via ORAL
  Filled 2017-05-26 (×3): qty 1

## 2017-05-26 MED ORDER — ACETAMINOPHEN 650 MG RE SUPP: 650.0000 mg | RECTAL | Status: DC | PRN

## 2017-05-26 MED ORDER — EPHEDRINE SULFATE 50 MG/ML IJ SOLN
INTRAMUSCULAR | Status: DC | PRN
  Administered 2017-05-26 (×2): 5 mg via INTRAVENOUS

## 2017-05-26 MED ORDER — FENTANYL CITRATE (PF) 100 MCG/2ML IJ SOLN
INTRAMUSCULAR | Status: DC | PRN
  Administered 2017-05-26 (×5): 25 ug via INTRAVENOUS

## 2017-05-26 MED ORDER — CEFAZOLIN SODIUM-DEXTROSE 1-4 GM/50ML-% IV SOLN
1.0000 g | Freq: Four times a day (QID) | INTRAVENOUS | Status: AC
  Administered 2017-05-26 (×2): 1 g via INTRAVENOUS
  Filled 2017-05-26 (×2): qty 50

## 2017-05-26 MED ORDER — ACETAMINOPHEN 325 MG PO TABS: 650.0000 mg | ORAL_TABLET | ORAL | Status: DC | PRN

## 2017-05-26 MED ORDER — METHOCARBAMOL 1000 MG/10ML IJ SOLN: 500.0000 mg | Freq: Four times a day (QID) | INTRAVENOUS | Status: DC | PRN

## 2017-05-26 MED ORDER — MIDAZOLAM HCL 5 MG/5ML IJ SOLN
INTRAMUSCULAR | Status: DC | PRN
  Administered 2017-05-26: .25 mg via INTRAVENOUS

## 2017-05-26 MED ORDER — OXYCODONE HCL 5 MG/5ML PO SOLN: 5.0000 mg | Freq: Once | ORAL | Status: DC | PRN

## 2017-05-26 MED ORDER — INSULIN ASPART 100 UNIT/ML ~~LOC~~ SOLN
4.0000 [IU] | Freq: Two times a day (BID) | SUBCUTANEOUS | Status: DC
  Administered 2017-05-26 – 2017-05-29 (×5): 4 [IU] via SUBCUTANEOUS

## 2017-05-26 MED ORDER — ONDANSETRON HCL 4 MG/2ML IJ SOLN
INTRAMUSCULAR | Status: DC | PRN
  Administered 2017-05-26: 4 mg via INTRAVENOUS

## 2017-05-26 MED ORDER — SODIUM CHLORIDE 0.9 % IR SOLN
Status: DC | PRN
  Administered 2017-05-26: 3000 mL

## 2017-05-26 MED ORDER — LACTATED RINGERS IV SOLN
INTRAVENOUS | Status: DC | PRN
  Administered 2017-05-26 (×3): via INTRAVENOUS

## 2017-05-26 MED ORDER — EPHEDRINE 5 MG/ML INJ
INTRAVENOUS | Status: AC
  Filled 2017-05-26: qty 10

## 2017-05-26 MED ORDER — INSULIN GLARGINE 100 UNIT/ML ~~LOC~~ SOLN
18.0000 [IU] | Freq: Every day | SUBCUTANEOUS | Status: DC
  Administered 2017-05-27: 10 [IU] via SUBCUTANEOUS
  Administered 2017-05-28 – 2017-05-29 (×2): 18 [IU] via SUBCUTANEOUS
  Filled 2017-05-26 (×3): qty 0.18

## 2017-05-26 MED ORDER — HYDROCODONE-ACETAMINOPHEN 7.5-325 MG PO TABS
1.0000 | ORAL_TABLET | ORAL | Status: DC | PRN
  Administered 2017-05-27 – 2017-05-28 (×5): 1 via ORAL
  Filled 2017-05-26 (×7): qty 1

## 2017-05-26 MED ORDER — PROPOFOL 10 MG/ML IV BOLUS
INTRAVENOUS | Status: AC
  Filled 2017-05-26: qty 20

## 2017-05-26 MED ORDER — ADULT MULTIVITAMIN W/MINERALS CH
1.0000 | ORAL_TABLET | Freq: Every day | ORAL | Status: DC
  Administered 2017-05-28 – 2017-05-29 (×2): 1 via ORAL
  Filled 2017-05-26 (×3): qty 1

## 2017-05-26 MED ORDER — METOCLOPRAMIDE HCL 5 MG/ML IJ SOLN: 5.0000 mg | Freq: Three times a day (TID) | INTRAMUSCULAR | Status: DC | PRN

## 2017-05-26 MED ORDER — ONDANSETRON HCL 4 MG/2ML IJ SOLN
INTRAMUSCULAR | Status: AC
  Filled 2017-05-26: qty 2

## 2017-05-26 MED ORDER — PROPOFOL 10 MG/ML IV BOLUS
INTRAVENOUS | Status: DC | PRN
  Administered 2017-05-26: 90 mg via INTRAVENOUS

## 2017-05-26 MED ORDER — CEFAZOLIN SODIUM-DEXTROSE 2-4 GM/100ML-% IV SOLN
2.0000 g | INTRAVENOUS | Status: AC
  Administered 2017-05-26: 2 g via INTRAVENOUS
  Filled 2017-05-26: qty 100

## 2017-05-26 MED ORDER — BUPIVACAINE-EPINEPHRINE (PF) 0.5% -1:200000 IJ SOLN
INTRAMUSCULAR | Status: DC | PRN
  Administered 2017-05-26: 30 mL via PERINEURAL

## 2017-05-26 MED ORDER — CHLORHEXIDINE GLUCONATE 4 % EX LIQD: 60.0000 mL | Freq: Once | CUTANEOUS | Status: DC

## 2017-05-26 MED ORDER — PHENOL 1.4 % MT LIQD: 1.0000 | OROMUCOSAL | Status: DC | PRN

## 2017-05-26 MED ORDER — OXYCODONE HCL 5 MG PO TABS: 5.0000 mg | ORAL_TABLET | Freq: Once | ORAL | Status: DC | PRN

## 2017-05-26 MED ORDER — ONDANSETRON HCL 4 MG PO TABS: 4.0000 mg | ORAL_TABLET | Freq: Four times a day (QID) | ORAL | Status: DC | PRN

## 2017-05-26 MED ORDER — INSULIN ASPART 100 UNIT/ML ~~LOC~~ SOLN
0.0000 [IU] | Freq: Three times a day (TID) | SUBCUTANEOUS | Status: DC
  Administered 2017-05-27 (×2): 2 [IU] via SUBCUTANEOUS
  Administered 2017-05-28: 3 [IU] via SUBCUTANEOUS
  Administered 2017-05-28: 5 [IU] via SUBCUTANEOUS
  Administered 2017-05-28: 3 [IU] via SUBCUTANEOUS
  Administered 2017-05-29: 5 [IU] via SUBCUTANEOUS

## 2017-05-26 MED ORDER — POLYETHYLENE GLYCOL 3350 17 G PO PACK: 17.0000 g | PACK | Freq: Every day | ORAL | Status: DC | PRN

## 2017-05-26 MED ORDER — MIDAZOLAM HCL 2 MG/2ML IJ SOLN
INTRAMUSCULAR | Status: AC
  Filled 2017-05-26: qty 2

## 2017-05-26 MED ORDER — LIDOCAINE 2% (20 MG/ML) 5 ML SYRINGE
INTRAMUSCULAR | Status: AC
  Filled 2017-05-26: qty 5

## 2017-05-26 MED ORDER — FENTANYL CITRATE (PF) 250 MCG/5ML IJ SOLN
INTRAMUSCULAR | Status: AC
  Filled 2017-05-26: qty 5

## 2017-05-26 MED ORDER — MENTHOL 3 MG MT LOZG: 1.0000 | LOZENGE | OROMUCOSAL | Status: DC | PRN

## 2017-05-26 MED ORDER — 0.9 % SODIUM CHLORIDE (POUR BTL) OPTIME
TOPICAL | Status: DC | PRN
  Administered 2017-05-26 (×2): 1000 mL

## 2017-05-26 MED ORDER — ASPIRIN EC 81 MG PO TBEC
81.0000 mg | DELAYED_RELEASE_TABLET | Freq: Every day | ORAL | Status: DC
  Administered 2017-05-27 – 2017-05-29 (×3): 81 mg via ORAL
  Filled 2017-05-26 (×3): qty 1

## 2017-05-26 MED ORDER — LEVOTHYROXINE SODIUM 88 MCG PO TABS
88.0000 ug | ORAL_TABLET | Freq: Every day | ORAL | Status: DC
  Administered 2017-05-27 – 2017-05-29 (×3): 88 ug via ORAL
  Filled 2017-05-26 (×3): qty 1

## 2017-05-26 MED ORDER — ROCURONIUM BROMIDE 10 MG/ML (PF) SYRINGE
PREFILLED_SYRINGE | INTRAVENOUS | Status: AC
  Filled 2017-05-26: qty 5

## 2017-05-26 MED ORDER — VANCOMYCIN HCL 500 MG IV SOLR
INTRAVENOUS | Status: AC
  Filled 2017-05-26: qty 500

## 2017-05-26 MED ORDER — ROCURONIUM BROMIDE 100 MG/10ML IV SOLN
INTRAVENOUS | Status: DC | PRN
  Administered 2017-05-26: 50 mg via INTRAVENOUS

## 2017-05-26 MED ORDER — FENTANYL CITRATE (PF) 100 MCG/2ML IJ SOLN: 25.0000 ug | INTRAMUSCULAR | Status: DC | PRN

## 2017-05-26 MED ORDER — PHENYLEPHRINE HCL 10 MG/ML IJ SOLN
INTRAVENOUS | Status: DC | PRN
  Administered 2017-05-26: 20 ug/min via INTRAVENOUS

## 2017-05-26 MED ORDER — METHOCARBAMOL 500 MG PO TABS
500.0000 mg | ORAL_TABLET | Freq: Four times a day (QID) | ORAL | Status: DC | PRN
  Administered 2017-05-27 (×2): 500 mg via ORAL
  Filled 2017-05-26 (×2): qty 1

## 2017-05-26 SURGICAL SUPPLY — 63 items
ALCOHOL 70% 16 OZ (MISCELLANEOUS) ×3 IMPLANT
BIT DRILL TWIST 2.7 (BIT) ×2 IMPLANT
BIT DRILL TWIST 2.7MM (BIT) ×1
BLADE SAW SGTL 13X75X1.27 (BLADE) ×3 IMPLANT
CAPT SHLDR REVTOTAL 2 ×3 IMPLANT
CLOSURE WOUND 1/2 X4 (GAUZE/BANDAGES/DRESSINGS) ×1
COVER SURGICAL LIGHT HANDLE (MISCELLANEOUS) ×3 IMPLANT
DRAPE INCISE IOBAN 66X45 STRL (DRAPES) ×6 IMPLANT
DRAPE U-SHAPE 47X51 STRL (DRAPES) ×6 IMPLANT
DRSG AQUACEL AG ADV 3.5X10 (GAUZE/BANDAGES/DRESSINGS) ×3 IMPLANT
DURAPREP 26ML APPLICATOR (WOUND CARE) ×3 IMPLANT
ELECT REM PT RETURN 9FT ADLT (ELECTROSURGICAL) ×3
ELECTRODE REM PT RTRN 9FT ADLT (ELECTROSURGICAL) ×1 IMPLANT
GLOVE BIOGEL PI IND STRL 7.5 (GLOVE) ×1 IMPLANT
GLOVE BIOGEL PI IND STRL 8 (GLOVE) ×1 IMPLANT
GLOVE BIOGEL PI INDICATOR 7.5 (GLOVE) ×2
GLOVE BIOGEL PI INDICATOR 8 (GLOVE) ×2
GLOVE ECLIPSE 7.0 STRL STRAW (GLOVE) ×3 IMPLANT
GLOVE SURG ORTHO 8.0 STRL STRW (GLOVE) ×3 IMPLANT
GOWN STRL REUS W/ TWL LRG LVL3 (GOWN DISPOSABLE) ×2 IMPLANT
GOWN STRL REUS W/ TWL XL LVL3 (GOWN DISPOSABLE) IMPLANT
GOWN STRL REUS W/TWL LRG LVL3 (GOWN DISPOSABLE) ×4
GOWN STRL REUS W/TWL XL LVL3 (GOWN DISPOSABLE)
HANDPIECE INTERPULSE COAX TIP (DISPOSABLE) ×2
HYDROGEN PEROXIDE 16OZ (MISCELLANEOUS) ×3 IMPLANT
KIT BASIN OR (CUSTOM PROCEDURE TRAY) ×3 IMPLANT
KIT HEADGEAR ORTHO CUSHION (MISCELLANEOUS) ×3 IMPLANT
KIT ROOM TURNOVER OR (KITS) ×3 IMPLANT
LOOP VESSEL MAXI BLUE (MISCELLANEOUS) IMPLANT
MANIFOLD NEPTUNE II (INSTRUMENTS) ×3 IMPLANT
NDL SUT 6 .5 CRC .975X.05 MAYO (NEEDLE) ×1 IMPLANT
NEEDLE MAYO TAPER (NEEDLE) ×2
NS IRRIG 1000ML POUR BTL (IV SOLUTION) ×3 IMPLANT
PACK SHOULDER (CUSTOM PROCEDURE TRAY) ×3 IMPLANT
PAD ARMBOARD 7.5X6 YLW CONV (MISCELLANEOUS) ×6 IMPLANT
PASSER SUT SWANSON 36MM LOOP (INSTRUMENTS) ×3 IMPLANT
PIN THREADED REVERSE (PIN) ×3 IMPLANT
SCREW CENTRAL 6.5X40 (Screw) ×3 IMPLANT
SCRUB BETADINE 4OZ XXX (MISCELLANEOUS) ×3 IMPLANT
SET HNDPC FAN SPRY TIP SCT (DISPOSABLE) ×1 IMPLANT
SLING ARM IMMOBILIZER LRG (SOFTGOODS) ×3 IMPLANT
SPONGE LAP 18X18 X RAY DECT (DISPOSABLE) ×3 IMPLANT
STRIP CLOSURE SKIN 1/2X4 (GAUZE/BANDAGES/DRESSINGS) ×2 IMPLANT
SUCTION FRAZIER HANDLE 10FR (MISCELLANEOUS) ×2
SUCTION TUBE FRAZIER 10FR DISP (MISCELLANEOUS) ×1 IMPLANT
SUT FIBERWIRE #2 38 T-5 BLUE (SUTURE) ×9
SUT MAXBRAID (SUTURE) ×21 IMPLANT
SUT MNCRL AB 3-0 PS2 18 (SUTURE) ×3 IMPLANT
SUT SILK 2 0 TIES 17X18 (SUTURE) ×2
SUT SILK 2-0 18XBRD TIE BLK (SUTURE) ×1 IMPLANT
SUT VIC AB 0 CT1 27 (SUTURE) ×10
SUT VIC AB 0 CT1 27XBRD ANBCTR (SUTURE) ×5 IMPLANT
SUT VIC AB 1 CT1 27 (SUTURE) ×2
SUT VIC AB 1 CT1 27XBRD ANBCTR (SUTURE) ×1 IMPLANT
SUT VIC AB 2-0 CT1 27 (SUTURE) ×6
SUT VIC AB 2-0 CT1 TAPERPNT 27 (SUTURE) ×3 IMPLANT
SUT VICRYL 0 AB UR-6 (SUTURE) ×9 IMPLANT
SUTURE FIBERWR #2 38 T-5 BLUE (SUTURE) ×3 IMPLANT
TOWEL OR 17X24 6PK STRL BLUE (TOWEL DISPOSABLE) ×3 IMPLANT
TOWEL OR 17X26 10 PK STRL BLUE (TOWEL DISPOSABLE) ×3 IMPLANT
TRAY FOLEY BAG SILVER LF 16FR (CATHETERS) IMPLANT
TRAY FOLEY SILVER 14FR TEMP (SET/KITS/TRAYS/PACK) ×3 IMPLANT
WATER STERILE IRR 1000ML POUR (IV SOLUTION) ×3 IMPLANT

## 2017-05-26 NOTE — Anesthesia Procedure Notes (Signed)
Anesthesia Regional Block: Interscalene brachial plexus block   Pre-Anesthetic Checklist: ,, timeout performed, Correct Patient, Correct Site, Correct Laterality, Correct Procedure, Correct Position, site marked, Risks and benefits discussed,  Surgical consent,  Pre-op evaluation,  At surgeon's request and post-op pain management  Laterality: Right  Prep: chloraprep       Needles:  Injection technique: Single-shot  Needle Type: Echogenic Stimulator Needle     Needle Length: 5cm  Needle Gauge: 22     Additional Needles:   Procedures:, nerve stimulator,,,,,,,   Nerve Stimulator or Paresthesia:  Response: biceps flexion, 0.45 mA,   Additional Responses:   Narrative:  Start time: 05/26/2017 7:17 AM End time: 05/26/2017 7:27 AM Injection made incrementally with aspirations every 5 mL.  Performed by: Personally  Anesthesiologist: Albertha Ghee, MD  Additional Notes: Functioning IV was confirmed and monitors were applied.  A 54mm 22ga Arrow echogenic stimulator needle was used. Sterile prep and drape,hand hygiene and sterile gloves were used.  Negative aspiration and negative test dose prior to incremental administration of local anesthetic. The patient tolerated the procedure well.  Ultrasound guidance: relevent anatomy identified, needle position confirmed, local anesthetic spread visualized around nerve(s), vascular puncture avoided.  Image printed for medical record.

## 2017-05-26 NOTE — Anesthesia Preprocedure Evaluation (Signed)
Anesthesia Evaluation  Patient identified by MRN, date of birth, ID band Patient awake    Reviewed: Allergy & Precautions, NPO status , Patient's Chart, lab work & pertinent test results  Airway Mallampati: II   Neck ROM: full    Dental   Pulmonary former smoker,    breath sounds clear to auscultation       Cardiovascular hypertension,  Rhythm:regular Rate:Normal     Neuro/Psych    GI/Hepatic   Endo/Other  diabetes, Type 2Hypothyroidism   Renal/GU      Musculoskeletal  (+) Arthritis ,   Abdominal   Peds  Hematology   Anesthesia Other Findings   Reproductive/Obstetrics                             Anesthesia Physical Anesthesia Plan  ASA: II  Anesthesia Plan: General   Post-op Pain Management:  Regional for Post-op pain   Induction: Intravenous  PONV Risk Score and Plan: 3 and Ondansetron and Treatment may vary due to age or medical condition  Airway Management Planned: Oral ETT  Additional Equipment:   Intra-op Plan:   Post-operative Plan: Extubation in OR  Informed Consent: I have reviewed the patients History and Physical, chart, labs and discussed the procedure including the risks, benefits and alternatives for the proposed anesthesia with the patient or authorized representative who has indicated his/her understanding and acceptance.     Plan Discussed with: CRNA, Anesthesiologist and Surgeon  Anesthesia Plan Comments:         Anesthesia Quick Evaluation

## 2017-05-26 NOTE — Progress Notes (Signed)
Patient arrived to 6n22 alert and oriented, states arm is numb from procedure so no pain. VSS, IV fluids running, SCDs on. Patient's right arm in sling with ice to shoulder area. Oriented to room and staff, will continue to monitor.

## 2017-05-26 NOTE — H&P (Signed)
Danielle [name] is an 84 y.o. female.   Chief Complaint:Right shoulder pain  HPI: Patient is an 84 year old female fell several weeks ago.  She injured multiple structures on the right side.  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  He has diminished functional capacity in the right arm.  She presents now for operative management after explanation of risks and benefits  Past Medical History:  Diagnosis Date  . Arthritis   . Diabetes mellitus without complication ([place])   . Hypertension   . Hypothyroidism (acquired)   . Pneumothorax 02/05/2017   right-after fall  . Rib fractures 02/05/2017   right side    Past Surgical History:  Procedure Laterality Date  . BREAST ENHANCEMENT SURGERY    . TUBAL LIGATION      History reviewed. No pertinent family history. Social History:  reports that she has quit smoking. she has never used smokeless tobacco. She reports that she does not drink alcohol or use drugs.  Allergies:  Allergies  Allergen Reactions  . Fosamax [Alendronate Sodium] Other (See Comments)    MUSCLE ACHES  . Prednisone     Feels jittery and nauseous    Medications Prior to Admission  Medication Sig Dispense Refill  . aspirin EC 81 MG tablet Take 81 mg by mouth daily.    . Calcium Carb-Cholecalciferol (CALCIUM 600 + D PO) Take 1 tablet by mouth daily.    Marland Kitchen ibuprofen (ADVIL,MOTRIN) 800 MG tablet Take 800 mg by mouth 2 (two) times daily as needed for moderate pain.    . Insulin Glargine (TOUJEO MAX SOLOSTAR) 300 UNIT/ML SOPN Inject 22 Units into the skin daily. (Patient taking differently: Inject 18-22 Units into the skin daily. PER SLIDING SCALE) 3 mL 0  . insulin lispro (HUMALOG) 100 UNIT/ML injection Inject 4 Units into the skin 2 (two) times daily.     Marland Kitchen levothyroxine (SYNTHROID, LEVOTHROID) 88 MCG tablet Take 88 mcg by mouth daily before breakfast.    . Multiple Vitamin (MULTIVITAMIN WITH MINERALS) TABS Take 1 tablet  by mouth daily.     . multivitamin-lutein (OCUVITE-LUTEIN) CAPS capsule Take 1 capsule by mouth daily.    . naproxen sodium (ALEVE) 220 MG tablet Take 220 mg by mouth daily as needed (PAIN).    . Omega-3 Fatty Acids (FISH OIL) 1000 MG CAPS Take 1,000 mg by mouth daily.    . vitamin B-12 (CYANOCOBALAMIN) 1000 MCG tablet Take 1,000 mcg by mouth daily.    Marland Kitchen atorvastatin (LIPITOR) 20 MG tablet Take 1 tablet (20 mg total) by mouth daily at 6 PM. (Patient not taking: Reported on 05/21/2017) 30 tablet 0  . Cefixime (SUPRAX) 400 MG CAPS capsule Take 1 capsule (400 mg total) by mouth daily. (Patient not taking: Reported on 05/21/2017) 2 capsule 0  . HYDROcodone-acetaminophen (NORCO/VICODIN) 5-325 MG tablet Take 1-2 tablets by mouth every 4 (four) hours as needed for moderate pain. (Patient not taking: Reported on 05/21/2017) 30 tablet 0  . lidocaine (LIDODERM) 5 % Place 2 patches onto the skin daily. Remove & Discard patch within 12 hours or as directed by MD (Patient not taking: Reported on 05/21/2017) 30 patch 0  . lisinopril (PRINIVIL,ZESTRIL) 5 MG tablet Take 1 tablet (5 mg total) by mouth daily. (Patient not taking: Reported on 05/21/2017) 30 tablet 0  . metoprolol tartrate (LOPRESSOR) 25 MG tablet Take 0.5 tablets (12.5 mg total) by mouth 2 (two) times daily. (Patient not taking: Reported on 05/21/2017)  30 tablet 0  . mupirocin ointment (BACTROBAN) 2 % Place 1 application into the nose 2 (two) times daily. 22 g 0  . ondansetron (ZOFRAN) 4 MG tablet Take 1 tablet (4 mg total) by mouth every 6 (six) hours as needed for nausea. (Patient not taking: Reported on 05/21/2017) 20 tablet 0  . polyethylene glycol (MIRALAX / GLYCOLAX) packet Take 17 g by mouth daily as needed. (Patient taking differently: Take 17 g by mouth daily as needed for mild constipation. ) 14 each 0    Results for orders placed or performed during the hospital encounter of 05/26/17 (from the past 48 hour(s))  Glucose, capillary     Status: Abnormal    Collection Time: 05/26/17  6:20 AM  Result Value Ref Range   Glucose-Capillary 214 (H) 65 - 99 mg/dL   Comment 1 Notify RN    Comment 2 Document in Chart    No results found.  Review of Systems  Musculoskeletal: Positive for joint pain.  All other systems reviewed and are negative.   Blood pressure (!) 200/49, pulse 83, temperature 98.1 F (36.7 C), temperature source Oral, resp. rate 18, SpO2 98 %. Physical Exam  Constitutional: She appears well-developed.  HENT:  Head: Normocephalic.  Eyes: Pupils are equal, round, and reactive to light.  Neck: Normal range of motion.  Cardiovascular: Normal rate.  Respiratory: Effort normal.  Neurological: She is alert.  Skin: Skin is warm.  Psychiatric: She has a normal mood and affect.  Examination of the right shoulder demonstrates coarse grinding and crepitus with any passive range of motion.  Patient has limited forward flexion and abduction both below 50 degrees.  Deltoid does fire.  Radial pulse is intact.  Patient has good grip EPL FPL interosseous biceps triceps and deltoid strength.  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  Plan is reverse shoulder replacement.  Risk and benefits are discussed including but not limited to infection nerve and vessel damage incomplete pain relief or incomplete healing especially of the tuberosities back to the shaft.  There is also a risk of infection as her blood glucose is not optimally managed at this time.  We will try to avoid the use of cement in this patient if possible.  All questions answered.  Anderson Malta, MD 05/26/2017, 7:03 AM

## 2017-05-26 NOTE — Anesthesia Procedure Notes (Signed)
Procedure Name: Intubation Date/Time: 05/26/2017 7:52 AM Performed by: Albertha Ghee, MD Pre-anesthesia Checklist: Emergency Drugs available, Patient identified, Suction available, Timeout performed and Patient being monitored Patient Re-evaluated:Patient Re-evaluated prior to induction Oxygen Delivery Method: Circle system utilized Preoxygenation: Pre-oxygenation with 100% oxygen Induction Type: IV induction Ventilation: Mask ventilation without difficulty and Oral airway inserted - appropriate to patient size Laryngoscope Size: Mac and 3 Grade View: Grade II Tube type: Oral Tube size: 7.5 mm Number of attempts: 1 Airway Equipment and Method: Stylet Placement Confirmation: ETT inserted through vocal cords under direct vision,  positive ETCO2 and breath sounds checked- equal and bilateral Secured at: 21 cm Tube secured with: Tape Dental Injury: Teeth and Oropharynx as per pre-operative assessment

## 2017-05-26 NOTE — Transfer of Care (Signed)
Immediate Anesthesia Transfer of Care Note  Patient: Danielle [name]  Procedure(s) Performed: REVERSE RIGHT SHOULDER ARTHROPLASTY (Right Shoulder)  Patient Location: PACU  Anesthesia Type:General  Level of Consciousness: awake, oriented, drowsy, patient cooperative and responds to stimulation  Airway & Oxygen Therapy: Patient Spontanous Breathing and Patient connected to face mask oxygen  Post-op Assessment: Report given to RN, Post -op Vital signs reviewed and stable and Patient moving all extremities  Post vital signs: Reviewed and stable  Last Vitals:  Vitals:   05/26/17 0735 05/26/17 1223  BP:  (!) 156/54  Pulse: 93 70  Resp: 14 18  Temp:  (P) 36.9 C  SpO2: 100% 100%    Last Pain:  Vitals:   05/26/17 1223  TempSrc:   PainSc: (P) 0-No pain      Patients Stated Pain Goal: 4 (16/10/96 0454)  Complications: No apparent anesthesia complications

## 2017-05-26 NOTE — Brief Op Note (Signed)
05/26/2017  12:17 PM  PATIENT:  Debbora Presto A Eckroth  84 y.o. female  PRE-OPERATIVE DIAGNOSIS:  right proximal humerus fracture  POST-OPERATIVE DIAGNOSIS:  right proximal humerus fracture  PROCEDURE:  Procedure(s): REVERSE RIGHT SHOULDER ARTHROPLASTY  SURGEON:  Surgeon(s): Marlou Sa, Tonna Corner, MD  ASSISTANT: Laure Kidney Rnfa  ANESTHESIA:   general  EBL: 75 ml    Total I/O In: 700 [I.V.:700] Out: 280 [Urine:205; Blood:75]  BLOOD ADMINISTERED: none  DRAINS: none   LOCAL MEDICATIONS USED:  none  SPECIMEN:  No Specimen  COUNTS:  YES  TOURNIQUET:  * No tourniquets in log *  DICTATION: .Other Dictation: Dictation Number [id number]  PLAN OF CARE: Admit to inpatient   PATIENT DISPOSITION:  PACU - hemodynamically stable

## 2017-05-27 ENCOUNTER — Ambulatory Visit (INDEPENDENT_AMBULATORY_CARE_PROVIDER_SITE_OTHER): Payer: Medicare Other | Admitting: Orthopedic Surgery

## 2017-05-27 ENCOUNTER — Encounter (HOSPITAL_COMMUNITY): Payer: Self-pay | Admitting: Orthopedic Surgery

## 2017-05-27 LAB — GLUCOSE, CAPILLARY
Glucose-Capillary: 142 mg/dL — ABNORMAL HIGH (ref 65–99)
Glucose-Capillary: 133 mg/dL — ABNORMAL HIGH (ref 65–99)
Glucose-Capillary: 137 mg/dL — ABNORMAL HIGH (ref 65–99)
Glucose-Capillary: 113 mg/dL — ABNORMAL HIGH (ref 65–99)

## 2017-05-27 MED ORDER — SODIUM CHLORIDE 0.9 % IV BOLUS (SEPSIS)
250.0000 mL | Freq: Once | INTRAVENOUS | Status: AC
  Administered 2017-05-27: 250 mL via INTRAVENOUS

## 2017-05-27 NOTE — Anesthesia Postprocedure Evaluation (Signed)
Anesthesia Post Note  Patient: Danielle [name]  Procedure(s) Performed: REVERSE RIGHT SHOULDER ARTHROPLASTY (Right Shoulder)     Patient location during evaluation: PACU Anesthesia Type: General Level of consciousness: awake and alert Pain management: pain level controlled Vital Signs Assessment: post-procedure vital signs reviewed and stable Respiratory status: spontaneous breathing, nonlabored ventilation, respiratory function stable and patient connected to nasal cannula oxygen Cardiovascular status: blood pressure returned to baseline and stable Postop Assessment: no apparent nausea or vomiting Anesthetic complications: no    Last Vitals:  Vitals:   05/27/17 0613 05/27/17 1340  BP: (!) 125/43 (!) 146/42  Pulse: 70 78  Resp: 16   Temp: 37.2 C 36.9 C  SpO2: 98% 96%    Last Pain:  Vitals:   05/27/17 1340  TempSrc: Oral  PainSc:                  Sai Moura S

## 2017-05-27 NOTE — Progress Notes (Signed)
Patient stable pain controlled  Hand is mobile functional insensate on the right Plan occupational therapy and possible discharge to home tomorrow We will keep her with the conservative protocol for the first couple of weeks due to poor bone quality

## 2017-05-27 NOTE — Evaluation (Signed)
Occupational Therapy Evaluation Patient Details Name: Danielle [name] MRN: [id number] DOB: [birth year] Today's Date: 05/27/2017    History of Present Illness Pt is an 84 y.o. female s/p R reverse TSA. PMHx: Arthritis, DM, HTN, Hypothyroidism, Hx of falls with injury.    Clinical Impression   Pt reports prior to fall she was independent with ADL and mobility. Currently pt requires min HHA for functional mobility and mod assist overall for ADL. Began shoulder, ADL, and safety education with pt and husband. Pt able to complete AROM x10 each at R elbow/wrist/hand; limited range at elbow (pain vs edema?). Pt planning to d/c home with 24/7 supervision from her husband. Recommending HHOT for follow up to maximize independence and safety with ADL and functional mobility upon return home. Pt would benefit from continued skilled OT to address established goals.    Follow Up Recommendations  Home health OT;Supervision/Assistance - 24 hour    Equipment Recommendations  None recommended by OT    Recommendations for Other Services PT consult     Precautions / Restrictions Precautions Precautions: Fall;Shoulder Type of Shoulder Precautions: Conservative Protocol: AROM elbow/wrist/hand OK. NO A/PROM at shoulder. Shoulder Interventions: Shoulder sling/immobilizer;At all times Precaution Booklet Issued: Yes (comment) Required Braces or Orthoses: Other Brace/Splint Restrictions Weight Bearing Restrictions: Yes RUE Weight Bearing: Non weight bearing      Mobility Bed Mobility Overal bed mobility: Needs Assistance Bed Mobility: Supine to Sit;Sit to Supine     Supine to sit: Supervision;HOB elevated Sit to supine: Min assist;HOB elevated   General bed mobility comments: Increased time and effort. HOB elevated. Min assist for LEs back to bed.  Transfers Overall transfer level: Needs assistance Equipment used: 1 person hand held assist Transfers: Sit to/from Stand Sit to Stand: Min assist          General transfer comment: to boost up and for balance in standing    Balance Overall balance assessment: Needs assistance;History of Falls Sitting-balance support: Feet supported;No upper extremity supported Sitting balance-Leahy Scale: Good     Standing balance support: Single extremity supported Standing balance-Leahy Scale: Poor Standing balance comment: Min assist for standing balance                           ADL either performed or assessed with clinical judgement   ADL Overall ADL's : Needs assistance/impaired Eating/Feeding: Minimal assistance;Sitting   Grooming: Minimal assistance;Sitting   Upper Body Bathing: Moderate assistance;Sitting   Lower Body Bathing: Moderate assistance;Sit to/from stand   Upper Body Dressing : Moderate assistance;Sitting   Lower Body Dressing: Moderate assistance;Sit to/from stand   Toilet Transfer: Minimal assistance;Ambulation;Comfort height toilet(HHA)   Toileting- Clothing Manipulation and Hygiene: Total assistance;Sit to/from stand Toileting - Clothing Manipulation Details (indicate cue type and reason): for peri care     Functional mobility during ADLs: Minimal assistance(HHA) General ADL Comments: Educated pt and husband on proper positinging of RUE in bed, sling management and wear schedule, UB bathing/dressing technique, RUE NWB, RUE exercises.     Vision         Perception     Praxis      Pertinent Vitals/Pain Pain Assessment: 0-10 Pain Score: 6  Pain Location: R shoulder Pain Descriptors / Indicators: Aching;Sore Pain Intervention(s): Monitored during session;Repositioned;Ice applied     Hand Dominance Right   Extremity/Trunk Assessment Upper Extremity Assessment Upper Extremity Assessment: RUE deficits/detail RUE Deficits / Details: decreased ROM at elbow (pain vs edema?) RUE: Unable to fully  assess due to immobilization;Unable to fully assess due to pain   Lower Extremity  Assessment Lower Extremity Assessment: Generalized weakness   Cervical / Trunk Assessment Cervical / Trunk Assessment: Normal   Communication Communication Communication: No difficulties   Cognition Arousal/Alertness: Awake/alert Behavior During Therapy: WFL for tasks assessed/performed Overall Cognitive Status: Within Functional Limits for tasks assessed                                     General Comments       Exercises Exercises: Shoulder Shoulder Exercises Elbow Flexion: AROM;Right;10 reps;Seated(limited--pain vs edema?) Elbow Extension: AROM;Right;10 reps;Seated(limited--pain vs edema?) Wrist Flexion: AROM;Right;10 reps;Seated Wrist Extension: AROM;Right;10 reps;Seated Digit Composite Flexion: AROM;Right;10 reps;Seated Composite Extension: AROM;Right;10 reps;Seated   Shoulder Instructions Shoulder Instructions Donning/doffing shirt without moving shoulder: Moderate assistance Method for sponge bathing under operated UE: Moderate assistance Donning/doffing sling/immobilizer: Maximal assistance Correct positioning of sling/immobilizer: Minimal assistance ROM for elbow, wrist and digits of operated UE: Minimal assistance Sling wearing schedule (on at all times/off for ADL's): Supervision/safety Proper positioning of operated UE when showering: Supervision/safety Positioning of UE while sleeping: Minimal assistance    Home Living Family/patient expects to be discharged to:: Private residence Living Arrangements: Spouse/significant other Available Help at Discharge: Family;Available 24 hours/day Type of Home: House Home Access: Stairs to enter [employer] of Steps: 2   Home Layout: Multi-level;Able to live on main level with bedroom/bathroom     Bathroom Shower/Tub: Tub/shower unit;Curtain   Bathroom Toilet: Handicapped height     Home Equipment: Environmental consultant - 2 wheels;Cane - single point;Wheelchair - manual;Bedside commode           Prior Functioning/Environment Level of Independence: Independent                 OT Problem List: Decreased strength;Impaired balance (sitting and/or standing);Decreased knowledge of use of DME or AE;Decreased knowledge of precautions;Pain;Impaired UE functional use;Increased edema      OT Treatment/Interventions: Self-care/ADL training;Therapeutic exercise;Energy conservation;DME and/or AE instruction;Therapeutic activities;Patient/family education;Balance training    OT Goals(Current goals can be found in the care plan section) Acute Rehab OT Goals Patient Stated Goal: decrease pain OT Goal Formulation: With patient/family Time For Goal Achievement: 06/10/17 Potential to Achieve Goals: Good ADL Goals Pt Will Perform Upper Body Dressing: with min assist;sitting Pt Will Perform Lower Body Dressing: with min assist;sit to/from stand Pt Will Transfer to Toilet: with supervision;ambulating;bedside commode Pt Will Perform Toileting - Clothing Manipulation and hygiene: with supervision;sit to/from stand Pt Will Perform Tub/Shower Transfer: Tub transfer;with supervision;ambulating;3 in 1 Additional ADL Goal #1: Pt will independently perform RUE elbow/wrist/hand ROM exercises. Additional ADL Goal #2: Pt/caregiver will independently don/doff sling.  OT Frequency: Min 3X/week   Barriers to D/C:            Co-evaluation              AM-PAC PT "6 Clicks" Daily Activity     Outcome Measure Help from another person eating meals?: A Little Help from another person taking care of personal grooming?: A Little Help from another person toileting, which includes using toliet, bedpan, or urinal?: A Lot Help from another person bathing (including washing, rinsing, drying)?: A Lot Help from another person to put on and taking off regular upper body clothing?: A Lot Help from another person to put on and taking off regular lower body clothing?: A Lot 6 Click Score: 14   End  of Session  Equipment Utilized During Treatment: Gait belt;Other (comment)(sling) Nurse Communication: Mobility status;Weight bearing status  Activity Tolerance: Patient tolerated treatment well Patient left: in bed;with call bell/phone within reach;with bed alarm set;with nursing/sitter in room;with family/visitor present  OT Visit Diagnosis: Unsteadiness on feet (R26.81);History of falling (Z91.81);Muscle weakness (generalized) (M62.81);Pain Pain - Right/Left: Right Pain - part of body: Shoulder                Time: 7628-3151 OT Time Calculation (min): 29 min Charges:  OT General Charges $OT Visit: 1 Visit OT Evaluation $OT Eval Moderate Complexity: 1 Mod OT Treatments $Self Care/Home Management : 8-22 mins G-Codes:     Gus Littler A. Ulice Brilliant, M.S., OTR/L Pager: [place] 05/27/2017, 9:54 AM

## 2017-05-27 NOTE — Progress Notes (Signed)
Patient has not voided but once today, urine is dark/concentrated, notified MD Dean's office, he gave verbal order to do 250 cc NS bolus once. Will continue to monitor.

## 2017-05-27 NOTE — Op Note (Signed)
NAME:  JASMAIN, AHLBERG                ACCOUNT NO.:  MEDICAL RECORD NO.:  [id number]  LOCATION:                                 FACILITY:  PHYSICIAN:  Anderson Malta, M.D.    DATE OF BIRTH:  July 24, 1931  DATE OF PROCEDURE: DATE OF DISCHARGE:                              OPERATIVE REPORT   PREOPERATIVE DIAGNOSIS:  Right proximal humerus fracture.  POSTOPERATIVE DIAGNOSIS:  Right proximal humerus fracture.  PROCEDURE:  Right proximal humerus and reverse shoulder replacement.  SURGEON:  Anderson Malta, M.D.  ASSISTANT:  Laure Kidney, RNFA.  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  She presents now for operative management after explanation of risks and benefits.  PROCEDURE IN DETAIL:  The patient was brought to the operating room where general anesthetic was induced.  Preoperative antibiotics were administered.  Time-out was called.  Right arm was prescrubbed with hydrogen peroxide, alcohol and Betadine, allowed to air dry, then prepped with DuraPrep solution and draped in a sterile manner including the hand.  The patient was placed in the beach chair position with the head in neutral position.  After prepping and draping, Charlie Pitter was used to cover the operative field.  Time-out was called.  The deltopectoral approach was made.  Skin incision made from the clavicle down to about 3 cm distal to the axillary crease anteriorly.  Skin and subcutaneous tissues were sharply divided.  Cephalic vein mobilized medially.  The subacromial space was cleared.  At this time, the Kobel retractor was placed.  The circumflex vessels were ligated.  Axillary nerve was visualized and tagged with a vessel loop and protected at all times during the case.  At this time, the biceps tendon was tenodesed to the pec major tendon.  Anterior deltoid released distally as the sleeve of the anterior one-third to ease some  of the deltoid tension.  At this time, the rotator interval was opened and released all the way to the coracoid process.  The subscapularis was then detached.  It was tagged. The greater tuberosity fracture fragment was mobilized and head was removed.  Bone graft was obtained.  Four sutures placed in the greater tuberosity fragment piece.  At this time, the capsule anteriorly was released with care being taken to avoid injury to the nerve. Circumferential capsular release was performed around the glenoid. Labrum was also removed.  At this time, the glenoid was prepared.  Small glenoid was chosen.  The guidepin was placed through the guide in the center portion of the glenoid vault.  Reaming was performed.  Inferior tilt of 5-10 degrees was maintained.  Bone quality was poor.  Screw fixation was marginal in the central screw.  Peripheral screws which were locking, were bicortical and obtain better fixation.  At this time, attention was directed towards the humerus.  The humerus was reamed and broached up to a size 12.  Size 12 trial stem was placed.  True glenosphere, +3 small, was then placed with level of the inferior offset.  At this time, trial reduction was performed with the neutral liner, which had +3  offset.  This also gave excellent stability with the nurse maneuver as well as with internal and external rotation.  There was also less than 2-3 mm of shock as well as good tension on the conjoint tendon.  Trial components removed.  True components placed with good stability of a press-fit prosthesis with bone grafting obtained in the humerus.  Following placement of the humeral stem, the true baseplate was placed.  This gave good fixation.  Good stability was also achieved.  The greater tuberosity was then fixed using the 4 MaxBraid sutures placed.  Two of them were placed also through the subscap.  The other 2 fixed the tuberosity to the shaft.  Bone grafting was  performed. Subscapularis also repaired in about 30 degrees of external rotation. Good motion and stability were maintained.  Rotator interval closed with 2 sutures with the arm in 30 degrees of external rotation.  Thorough irrigation performed.  Vancomycin powder placed in each level of the incisional closure.  Incision was then closed using #1 Vicryl suture, 0 Vicryl suture, 2-0 Vicryl suture, and a 3-0 Monocryl.  Aquacel dressing and immobilizer were placed.  The patient tolerated the procedure well without immediate complications.  Transferred to the recovery room in stable condition.     Anderson Malta, M.D.     GSD/MEDQ  D:  05/26/2017  T:  05/26/2017  Job:  [id number]

## 2017-05-28 LAB — GLUCOSE, CAPILLARY
GLUCOSE-CAPILLARY: 233 mg/dL — AB (ref 65–99)
GLUCOSE-CAPILLARY: 124 mg/dL — AB (ref 65–99)
Glucose-Capillary: 177 mg/dL — ABNORMAL HIGH (ref 65–99)
Glucose-Capillary: 186 mg/dL — ABNORMAL HIGH (ref 65–99)

## 2017-05-28 NOTE — Progress Notes (Signed)
Pt stable Needs pt for gait training Plan dc am

## 2017-05-28 NOTE — Evaluation (Signed)
Physical Therapy Evaluation Patient Details Name: Danielle [name] MRN: [id number] DOB: 1932/01/21 Today's Date: 05/28/2017   History of Present Illness  Pt is an 84 y.o. female s/p R reverse TSA. PMHx: Arthritis, DM, HTN, Hypothyroidism, Hx of falls with injury.   Clinical Impression  Pt presented supine in bed with HOB elevated, awake and willing to participate in therapy session. Pt's spouse present throughout session as well. Prior to admission, pt reported that she was independent with all functional mobility and ADLs. Pt with admitted hx of falls. Pt lives with her husband who will be able to provide 24/7 supervision/assistance. Pt ambulated a short distance within her room with min A and 1HHA for stability/balance. Pt limited in distance ambulated secondary to fatigue and wanting to eat breakfast. Pt would continue to benefit from skilled physical therapy services at this time while admitted and after d/c to address the below listed limitations in order to improve overall safety and independence with functional mobility.      Follow Up Recommendations Home health PT;Supervision/Assistance - 24 hour    Equipment Recommendations  None recommended by PT    Recommendations for Other Services       Precautions / Restrictions Precautions Precautions: Fall;Shoulder Type of Shoulder Precautions: Conservative Protocol: AROM elbow/wrist/hand OK. NO A/PROM at shoulder. Shoulder Interventions: Shoulder sling/immobilizer;At all times Required Braces or Orthoses: Other Brace/Splint Restrictions Weight Bearing Restrictions: Yes RUE Weight Bearing: Non weight bearing      Mobility  Bed Mobility Overal bed mobility: Needs Assistance Bed Mobility: Supine to Sit;Sit to Supine     Supine to sit: Supervision;HOB elevated Sit to supine: Min assist;HOB elevated   General bed mobility comments: Increased time and effort. HOB elevated. Min assist for LEs back to bed.  Transfers Overall  transfer level: Needs assistance Equipment used: 1 person hand held assist Transfers: Sit to/from Stand Sit to Stand: Min assist         General transfer comment: min A to power into standing with use of momentum and for stability with transition  Ambulation/Gait Ambulation/Gait assistance: Min assist Ambulation Distance (Feet): 20 Feet Assistive device: 1 person hand held assist Gait Pattern/deviations: Step-to pattern;Decreased step length - right;Decreased step length - left;Decreased stride length;Shuffle;Narrow base of support Gait velocity: decreased Gait velocity interpretation: <1.8 ft/sec, indicative of risk for recurrent falls General Gait Details: pt with modest instability especially with directional changes, requiring constant min A; pt limited in distance secondary to fatigue and breakfast present  Stairs            Wheelchair Mobility    Modified Rankin (Stroke Patients Only)       Balance Overall balance assessment: Needs assistance;History of Falls Sitting-balance support: Feet supported;No upper extremity supported Sitting balance-Leahy Scale: Good     Standing balance support: Single extremity supported Standing balance-Leahy Scale: Poor Standing balance comment: Min assist for standing balance                             Pertinent Vitals/Pain Pain Assessment: Faces Faces Pain Scale: Hurts even more Pain Location: R shoulder Pain Descriptors / Indicators: Aching;Sore Pain Intervention(s): Monitored during session;Repositioned    Home Living Family/patient expects to be discharged to:: Private residence Living Arrangements: Spouse/significant other Available Help at Discharge: Family;Available 24 hours/day Type of Home: House Home Access: Stairs to enter   [employer] of Steps: 2 Home Layout: Multi-level;Able to live on main level with bedroom/bathroom Home  Equipment: Gilford Rile - 2 wheels;Cane - single point;Wheelchair  - manual;Bedside commode      Prior Function Level of Independence: Independent               Hand Dominance   Dominant Hand: Right    Extremity/Trunk Assessment   Upper Extremity Assessment Upper Extremity Assessment: Defer to OT evaluation    Lower Extremity Assessment Lower Extremity Assessment: Generalized weakness       Communication   Communication: No difficulties  Cognition Arousal/Alertness: Awake/alert Behavior During Therapy: WFL for tasks assessed/performed Overall Cognitive Status: Within Functional Limits for tasks assessed                                        General Comments      Exercises     Assessment/Plan    PT Assessment Patient needs continued PT services  PT Problem List Decreased strength;Decreased activity tolerance;Decreased balance;Decreased mobility;Decreased coordination;Decreased safety awareness;Decreased knowledge of precautions;Pain       PT Treatment Interventions DME instruction;Gait training;Stair training;Functional mobility training;Therapeutic activities;Therapeutic exercise;Balance training;Neuromuscular re-education;Patient/family education    PT Goals (Current goals can be found in the Care Plan section)  Acute Rehab PT Goals Patient Stated Goal: decrease pain PT Goal Formulation: With patient/family Time For Goal Achievement: 06/11/17 Potential to Achieve Goals: Good    Frequency Min 3X/week   Barriers to discharge        Co-evaluation               AM-PAC PT "6 Clicks" Daily Activity  Outcome Measure Difficulty turning over in bed (including adjusting bedclothes, sheets and blankets)?: A Little Difficulty moving from lying on back to sitting on the side of the bed? : A Little Difficulty sitting down on and standing up from a chair with arms (e.g., wheelchair, bedside commode, etc,.)?: Unable Help needed moving to and from a bed to chair (including a wheelchair)?: A Little Help  needed walking in hospital room?: A Little Help needed climbing 3-5 steps with a railing? : A Little 6 Click Score: 16    End of Session   Activity Tolerance: Patient limited by fatigue Patient left: in bed;with call bell/phone within reach;with family/visitor present Nurse Communication: Mobility status PT Visit Diagnosis: Other abnormalities of gait and mobility (R26.89);Unsteadiness on feet (R26.81)    Time: 1779-3903 PT Time Calculation (min) (ACUTE ONLY): 12 min   Charges:   PT Evaluation $PT Eval Moderate Complexity: 1 Mod     PT G Codes:        [place], PT, DPT [place] 05/28/2017, 9:05 AM

## 2017-05-28 NOTE — Progress Notes (Signed)
Occupational Therapy Treatment Patient Details Name: Danielle [name] MRN: [id number] DOB: [birth year] Today's Date: 05/28/2017    History of present illness Pt is an 84 y.o. female s/p R reverse TSA. PMHx: Arthritis, DM, HTN, Hypothyroidism, Hx of falls with injury.    OT comments  Pt with increased edema in R hand/forearm from yesterday. Re-educated pt and husband on importance of completing R elbow/wrist/hand exercises throughout the day as part of edema reduction. Also educated on and performed retrograde massage. Pt continues to require min HHA for functional mobility for toilet transfers. Reviewed all shoulder education with pt and husband; they were able to recall much of it from our session yesterday. D/c plan remains appropriate. Will continue to follow acutely.   Follow Up Recommendations  Home health OT;Supervision/Assistance - 24 hour    Equipment Recommendations  None recommended by OT    Recommendations for Other Services      Precautions / Restrictions Precautions Precautions: Fall;Shoulder Type of Shoulder Precautions: Conservative Protocol: AROM elbow/wrist/hand OK. NO A/PROM at shoulder. Shoulder Interventions: Shoulder sling/immobilizer;At all times Required Braces or Orthoses: Sling Restrictions Weight Bearing Restrictions: Yes RUE Weight Bearing: Non weight bearing       Mobility Bed Mobility Overal bed mobility: Needs Assistance Bed Mobility: Supine to Sit;Sit to Supine     Supine to sit: Supervision;HOB elevated Sit to supine: Min assist;HOB elevated   General bed mobility comments: Assist for LEs back to bed  Transfers Overall transfer level: Needs assistance Equipment used: 1 person hand held assist Transfers: Sit to/from Stand Sit to Stand: Min assist         General transfer comment: min assist to boost up from EOB and toilet. Cues for technique    Balance Overall balance assessment: Needs assistance;History of Falls Sitting-balance  support: Feet supported;No upper extremity supported Sitting balance-Leahy Scale: Good     Standing balance support: No upper extremity supported;During functional activity Standing balance-Leahy Scale: Poor Standing balance comment: Min assist for static standing                           ADL either performed or assessed with clinical judgement   ADL Overall ADL's : Needs assistance/impaired                 Upper Body Dressing : Moderate assistance;Sitting Upper Body Dressing Details (indicate cue type and reason): for sling management     Toilet Transfer: Minimal assistance;Ambulation;Comfort height toilet;Grab bars(HHA)   Toileting- Clothing Manipulation and Hygiene: Minimal assistance;Sit to/from stand Toileting - Clothing Manipulation Details (indicate cue type and reason): for balance, pt abel to complete peri care     Functional mobility during ADLs: Minimal assistance(HHA) General ADL Comments: Reviewed shoulder and ADL education with pt and husband. Discussed follow up therapy. Pt with increased edema noted in R hand/UE--educated on edema management strategies.     Vision       Perception     Praxis      Cognition Arousal/Alertness: Awake/alert Behavior During Therapy: WFL for tasks assessed/performed Overall Cognitive Status: Within Functional Limits for tasks assessed                                          Exercises Exercises: Shoulder;Other exercises Shoulder Exercises Elbow Flexion: AROM;Right;10 reps;Seated Elbow Extension: AROM;Right;10 reps;Seated Wrist Flexion: AROM;Right;10 reps;Seated Wrist Extension: AROM;Right;10 reps;Seated  Digit Composite Flexion: AROM;Right;10 reps;Seated Composite Extension: AROM;Right;10 reps;Seated Other Exercises Other Exercises: Increased edema noted from yesterday in R hand/forearm. Educated pt on importance of completing elbow/wrist/hand exercises and educated on retrograde massage.  Continue to recommend completing exercises AT LEAST 3x per day; pt and husband aware.   Shoulder Instructions Shoulder Instructions Donning/doffing shirt without moving shoulder: Moderate assistance Method for sponge bathing under operated UE: Moderate assistance Donning/doffing sling/immobilizer: Moderate assistance Correct positioning of sling/immobilizer: Minimal assistance ROM for elbow, wrist and digits of operated UE: Minimal assistance Sling wearing schedule (on at all times/off for ADL's): Supervision/safety Proper positioning of operated UE when showering: Supervision/safety Positioning of UE while sleeping: Minimal assistance     General Comments      Pertinent Vitals/ Pain       Pain Assessment: Faces Faces Pain Scale: Hurts whole lot Pain Location: R shoulder Pain Descriptors / Indicators: Grimacing;Guarding Pain Intervention(s): Monitored during session;Limited activity within patient's tolerance;Ice applied  Home Living Family/patient expects to be discharged to:: Private residence Living Arrangements: Spouse/significant other Available Help at Discharge: Family;Available 24 hours/day Type of Home: House Home Access: Stairs to enter [employer] of Steps: 2   Home Layout: Multi-level;Able to live on main level with bedroom/bathroom     Bathroom Shower/Tub: Tub/shower unit;Curtain   Bathroom Toilet: Handicapped height     Home Equipment: Environmental consultant - 2 wheels;Cane - single point;Wheelchair - manual;Bedside commode          Prior Functioning/Environment Level of Independence: Independent            Frequency  Min 3X/week        Progress Toward Goals  OT Goals(current goals can now be found in the care plan section)  Progress towards OT goals: Progressing toward goals  Acute Rehab OT Goals Patient Stated Goal: decrease pain OT Goal Formulation: With patient/family  Plan Discharge plan remains appropriate    Co-evaluation                  AM-PAC PT "6 Clicks" Daily Activity     Outcome Measure   Help from another person eating meals?: A Little Help from another person taking care of personal grooming?: A Little Help from another person toileting, which includes using toliet, bedpan, or urinal?: A Little Help from another person bathing (including washing, rinsing, drying)?: A Lot Help from another person to put on and taking off regular upper body clothing?: A Lot Help from another person to put on and taking off regular lower body clothing?: A Lot 6 Click Score: 15    End of Session Equipment Utilized During Treatment: Other (comment)(sling)  OT Visit Diagnosis: Unsteadiness on feet (R26.81);History of falling (Z91.81);Muscle weakness (generalized) (M62.81);Pain Pain - Right/Left: Right Pain - part of body: Shoulder   Activity Tolerance Patient limited by pain   Patient Left in bed;with call bell/phone within reach;with family/visitor present;with SCD's reapplied   Nurse Communication          Time: 1638-4665 OT Time Calculation (min): 20 min  Charges: OT General Charges $OT Visit: 1 Visit OT Treatments $Therapeutic Activity: 8-22 mins  Barbar Brede A. Ulice Brilliant, M.S., OTR/L Pager: [place] 05/28/2017, 10:48 AM

## 2017-05-29 LAB — GLUCOSE, CAPILLARY: GLUCOSE-CAPILLARY: 232 mg/dL — AB (ref 65–99)

## 2017-05-29 MED ORDER — METHOCARBAMOL 500 MG PO TABS: 500.0000 mg | ORAL_TABLET | Freq: Four times a day (QID) | ORAL | 0 refills | Status: DC | PRN

## 2017-05-29 NOTE — Progress Notes (Signed)
Occupational Therapy Treatment Patient Details Name: Danielle [name] MRN: [id number] DOB: 18-Dec-1931 Today's Date: 05/29/2017    History of present illness Pt is an 84 y.o. female s/p R reverse TSA. PMHx: Arthritis, DM, HTN, Hypothyroidism, Hx of falls with injury.    OT comments  Pt required max assist for UB/LB dressing today but able to verbally recall correct technique and compensatory strategies--husband able to assist at home. Pt continues to have increased edema in R hand and arm but better than yesterday; pt able to teach back exercises and edema management strategies, reports she completed them yesterday with assist from family. D/c plan remains appropriate. Will continue to follow acutely.   Follow Up Recommendations  Home health OT;Supervision/Assistance - 24 hour    Equipment Recommendations  None recommended by OT    Recommendations for Other Services      Precautions / Restrictions Precautions Precautions: Fall;Shoulder Type of Shoulder Precautions: Conservative Protocol: AROM elbow/wrist/hand OK. NO A/PROM at shoulder. Shoulder Interventions: Shoulder sling/immobilizer;At all times Required Braces or Orthoses: Sling Restrictions Weight Bearing Restrictions: Yes RUE Weight Bearing: Non weight bearing       Mobility Bed Mobility Overal bed mobility: Needs Assistance Bed Mobility: Supine to Sit;Sit to Supine     Supine to sit: Supervision;HOB elevated Sit to supine: Min assist;HOB elevated   General bed mobility comments: Assist for LEs back to bed  Transfers Overall transfer level: Needs assistance Equipment used: 1 person hand held assist Transfers: Sit to/from Stand Sit to Stand: Min assist         General transfer comment: Light min assist to boost up from EOB    Balance Overall balance assessment: Needs assistance;History of Falls Sitting-balance support: Feet supported;No upper extremity supported Sitting balance-Leahy Scale: Good      Standing balance support: No upper extremity supported Standing balance-Leahy Scale: Fair Standing balance comment: for static standing only                           ADL either performed or assessed with clinical judgement   ADL Overall ADL's : Needs assistance/impaired                 Upper Body Dressing : Maximal assistance;Sitting Upper Body Dressing Details (indicate cue type and reason): to don shirt and sling Lower Body Dressing: Maximal assistance;Sit to/from stand Lower Body Dressing Details (indicate cue type and reason): to start underwear and pants over feet and to pull up in standing               General ADL Comments: Pt able to recall exercises and reports she worked on edema management strategies yesterday.     Vision       Perception     Praxis      Cognition Arousal/Alertness: Awake/alert Behavior During Therapy: WFL for tasks assessed/performed Overall Cognitive Status: Within Functional Limits for tasks assessed                                          Exercises Exercises: Shoulder;Other exercises Shoulder Exercises Elbow Flexion: AROM;Right;10 reps;Seated Elbow Extension: AROM;Right;10 reps;Seated Wrist Flexion: AROM;Right;10 reps;Seated Wrist Extension: AROM;Right;10 reps;Seated Digit Composite Flexion: AROM;Right;10 reps;Seated Composite Extension: AROM;Right;10 reps;Seated Other Exercises Other Exercises: Continues to have increased edema in R hand and forearm but better than yesterday. Pt able to teach back exercises  and edema management techniques. Reports she completed yesterday with husband and daughter assisting with retrograde massage.   Shoulder Instructions Shoulder Instructions Donning/doffing shirt without moving shoulder: Maximal assistance Method for sponge bathing under operated UE: Maximal assistance Donning/doffing sling/immobilizer: Maximal assistance Correct positioning of  sling/immobilizer: Minimal assistance ROM for elbow, wrist and digits of operated UE: Supervision/safety Sling wearing schedule (on at all times/off for ADL's): Supervision/safety Proper positioning of operated UE when showering: Supervision/safety Positioning of UE while sleeping: Minimal assistance     General Comments      Pertinent Vitals/ Pain       Pain Assessment: Faces Faces Pain Scale: Hurts even more Pain Location: R shoulder Pain Descriptors / Indicators: Grimacing;Aching;Sore Pain Intervention(s): Monitored during session;Repositioned  Home Living                                          Prior Functioning/Environment              Frequency  Min 3X/week        Progress Toward Goals  OT Goals(current goals can now be found in the care plan section)  Progress towards OT goals: Progressing toward goals  Acute Rehab OT Goals Patient Stated Goal: home today OT Goal Formulation: With patient/family  Plan Discharge plan remains appropriate    Co-evaluation                 AM-PAC PT "6 Clicks" Daily Activity     Outcome Measure   Help from another person eating meals?: A Little Help from another person taking care of personal grooming?: A Little Help from another person toileting, which includes using toliet, bedpan, or urinal?: A Little Help from another person bathing (including washing, rinsing, drying)?: A Lot Help from another person to put on and taking off regular upper body clothing?: A Lot Help from another person to put on and taking off regular lower body clothing?: A Lot 6 Click Score: 15    End of Session Equipment Utilized During Treatment: Other (comment)(sling)  OT Visit Diagnosis: Unsteadiness on feet (R26.81);History of falling (Z91.81);Muscle weakness (generalized) (M62.81);Pain Pain - Right/Left: Right Pain - part of body: Shoulder   Activity Tolerance Patient tolerated treatment well   Patient Left in  bed;with call bell/phone within reach;with family/visitor present   Nurse Communication Other (comment)(pt ready for d/c)        Time: 1002-1020 OT Time Calculation (min): 18 min  Charges: OT General Charges $OT Visit: 1 Visit OT Treatments $Self Care/Home Management : 8-22 mins  Nafeesa Dils A. Ulice Brilliant, M.S., OTR/L Pager: [place] 05/29/2017, 11:02 AM

## 2017-05-29 NOTE — Progress Notes (Signed)
Pt stable - amb in room Right hand functional Plan dc today

## 2017-05-29 NOTE — Care Management Note (Signed)
Case Management Note  Patient Details  Name: KILEE HEDDING MRN: [id number] Date of Birth: Sep 24, 1931  Subjective/Objective:                    Action/Plan:   Expected Discharge Date:  05/29/17               Expected Discharge Plan:  [place]  In-House Referral:     Discharge planning Services  CM Consult  Post Acute Care Choice:  Home Health Choice offered to:  Patient, Spouse  DME Arranged:  N/A DME Agency:  NA  HH Arranged:  PT, OT HH Agency:  [place]  Status of Service:  Completed, signed off  If discussed at [place] of Stay Meetings, dates discussed:    Additional Comments:  Marilu Favre, RN 05/29/2017, 8:52 AM

## 2017-06-02 ENCOUNTER — Telehealth (INDEPENDENT_AMBULATORY_CARE_PROVIDER_SITE_OTHER): Payer: Self-pay | Admitting: Radiology

## 2017-06-02 NOTE — Telephone Encounter (Signed)
Danielle [name] from [facility] called and requested HHOT 2x wk x 6 wks.  I advise ok, and if patient needs to go to OP PT sooner we can do that.  Patient does have a f/u with you later this week, s/p reverse TSA.  Danielle [name] has a protocol for reverse TSA, but if you have a specific protocol you want her to follow please advise and I can fax to her.  Fax [phone].  Thanks.

## 2017-06-03 ENCOUNTER — Encounter (INDEPENDENT_AMBULATORY_CARE_PROVIDER_SITE_OTHER): Payer: Self-pay | Admitting: Radiology

## 2017-06-03 NOTE — Telephone Encounter (Signed)
Faxed to [facility] as she requested, letter in chart

## 2017-06-03 NOTE — Telephone Encounter (Signed)
Okay for passive range of motion to pain tolerance with no resistive rotator cuff work due to fracture tuberosity fixation.  Please call thanks

## 2017-06-05 ENCOUNTER — Telehealth (INDEPENDENT_AMBULATORY_CARE_PROVIDER_SITE_OTHER): Payer: Self-pay | Admitting: Radiology

## 2017-06-05 ENCOUNTER — Encounter (INDEPENDENT_AMBULATORY_CARE_PROVIDER_SITE_OTHER): Payer: Self-pay | Admitting: Orthopedic Surgery

## 2017-06-05 ENCOUNTER — Ambulatory Visit (INDEPENDENT_AMBULATORY_CARE_PROVIDER_SITE_OTHER): Payer: Medicare Other

## 2017-06-05 ENCOUNTER — Ambulatory Visit (INDEPENDENT_AMBULATORY_CARE_PROVIDER_SITE_OTHER): Payer: Medicare Other | Admitting: Orthopedic Surgery

## 2017-06-05 DIAGNOSIS — Z96611 Presence of right artificial shoulder joint: Secondary | ICD-10-CM | POA: Diagnosis not present

## 2017-06-05 MED ORDER — OXYCODONE HCL 5 MG PO CAPS: ORAL_CAPSULE | ORAL | 0 refills | Status: DC

## 2017-06-05 NOTE — Telephone Encounter (Signed)
Kat OT with [facility] calling [phone] she received order for PT, 3x week for 4 weeks. Its going to be occupational therapy that is going to be seeing him wants to know if she can increase from 2 to 3. Advised this was fine, but she wants clarification from Dr. Marlou Sa. Please advise.

## 2017-06-05 NOTE — Discharge Summary (Signed)
Physician Discharge Summary  Patient ID: Danielle [name] MRN: [id number] DOB/AGE: May 26, 1931 84 y.o.  Admit date: 05/26/2017 Discharge date: 05/29/2017  Admission Diagnoses:  Active Problems:   Proximal humerus fracture   Discharge Diagnoses:  Same  Surgeries: Procedure(s): REVERSE RIGHT SHOULDER ARTHROPLASTY on 05/26/2017   Consultants:   Discharged Condition: Stable  Hospital Course: Danielle [name] is an 84 y.o. 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  They were brought to the operating room on 05/26/2017 and underwent the above named procedures.  Patient tolerated the procedure well.  She had some issues with pain control and mobilization after surgery.  All these were addressed and she was mobile and her pain was under control at the time of discharge on postop day #3.  She was seen by occupational therapy for elbow range of motion and sling education.  We will go slow with her rehab due to her osteoporosis.  Antibiotics given:  Anti-infectives (From admission, onward)   Start     Dose/Rate Route Frequency Ordered Stop   05/26/17 1500  ceFAZolin (ANCEF) IVPB 1 g/50 mL premix     1 g 100 mL/hr over 30 Minutes Intravenous Every 6 hours 05/26/17 1404 05/26/17 2200   05/26/17 1128  vancomycin (VANCOCIN) powder  Status:  Discontinued       As needed 05/26/17 1128 05/26/17 1218   05/26/17 0550  ceFAZolin (ANCEF) IVPB 2g/100 mL premix     2 g 200 mL/hr over 30 Minutes Intravenous On call to O.R. 05/26/17 [id number] 05/26/17 0750    .  Recent vital signs:  Vitals:   05/28/17 2128 05/29/17 0500  BP: (!) 137/54 (!) 152/46  Pulse: 84 86  Resp: 17 16  Temp: 99 F (37.2 C) 98.1 F (36.7 C)  SpO2: 97% 97%    Recent laboratory studies:  Results for orders placed or performed during the hospital encounter of 05/26/17  Glucose,  capillary  Result Value Ref Range   Glucose-Capillary 214 (H) 65 - 99 mg/dL   Comment 1 Notify RN    Comment 2 Document in Chart   Glucose, capillary  Result Value Ref Range   Glucose-Capillary 211 (H) 65 - 99 mg/dL  Glucose, capillary  Result Value Ref Range   Glucose-Capillary 195 (H) 65 - 99 mg/dL  Glucose, capillary  Result Value Ref Range   Glucose-Capillary 159 (H) 65 - 99 mg/dL   Comment 1 Notify RN   Glucose, capillary  Result Value Ref Range   Glucose-Capillary 198 (H) 65 - 99 mg/dL  Glucose, capillary  Result Value Ref Range   Glucose-Capillary 130 (H) 65 - 99 mg/dL   Comment 1 Notify RN    Comment 2 Document in Chart   Glucose, capillary  Result Value Ref Range   Glucose-Capillary 142 (H) 65 - 99 mg/dL  Glucose, capillary  Result Value Ref Range   Glucose-Capillary 133 (H) 65 - 99 mg/dL  Glucose, capillary  Result Value Ref Range   Glucose-Capillary 137 (H) 65 - 99 mg/dL  Glucose, capillary  Result Value Ref Range   Glucose-Capillary 113 (H) 65 - 99 mg/dL  Glucose, capillary  Result Value Ref Range   Glucose-Capillary 233 (H) 65 - 99 mg/dL  Glucose, capillary  Result Value Ref Range   Glucose-Capillary 177 (H) 65 - 99 mg/dL  Glucose, capillary  Result Value Ref Range  Glucose-Capillary 186 (H) 65 - 99 mg/dL  Glucose, capillary  Result Value Ref Range   Glucose-Capillary 124 (H) 65 - 99 mg/dL   Comment 1 Notify RN    Comment 2 Document in Chart   Glucose, capillary  Result Value Ref Range   Glucose-Capillary 232 (H) 65 - 99 mg/dL    Discharge Medications:   Allergies as of 05/29/2017      Reactions   Fosamax [alendronate Sodium] Other (See Comments)   MUSCLE ACHES   Prednisone    Feels jittery and nauseous      Medication List    STOP taking these medications   HYDROcodone-acetaminophen 5-325 MG tablet Commonly known as:  NORCO/VICODIN   naproxen sodium 220 MG tablet Commonly known as:  ALEVE     TAKE these medications   aspirin EC  81 MG tablet Take 81 mg by mouth daily.   atorvastatin 20 MG tablet Commonly known as:  LIPITOR Take 1 tablet (20 mg total) by mouth daily at 6 PM.   CALCIUM 600 + D PO Take 1 tablet by mouth daily.   Cefixime 400 MG Caps capsule Commonly known as:  SUPRAX Take 1 capsule (400 mg total) by mouth daily.   Fish Oil 1000 MG Caps Take 1,000 mg by mouth daily.   ibuprofen 800 MG tablet Commonly known as:  ADVIL,MOTRIN Take 800 mg by mouth 2 (two) times daily as needed for moderate pain.   Insulin Glargine 300 UNIT/ML Sopn Commonly known as:  TOUJEO MAX SOLOSTAR Inject 22 Units into the skin daily. What changed:    how much to take  additional instructions   insulin lispro 100 UNIT/ML injection Commonly known as:  HUMALOG Inject 4 Units into the skin 2 (two) times daily.   levothyroxine 88 MCG tablet Commonly known as:  SYNTHROID, LEVOTHROID Take 88 mcg by mouth daily before breakfast.   lidocaine 5 % Commonly known as:  LIDODERM Place 2 patches onto the skin daily. Remove & Discard patch within 12 hours or as directed by MD   lisinopril 5 MG tablet Commonly known as:  PRINIVIL,ZESTRIL Take 1 tablet (5 mg total) by mouth daily.   methocarbamol 500 MG tablet Commonly known as:  ROBAXIN Take 1 tablet (500 mg total) by mouth every 6 (six) hours as needed for muscle spasms.   metoprolol tartrate 25 MG tablet Commonly known as:  LOPRESSOR Take 0.5 tablets (12.5 mg total) by mouth 2 (two) times daily.   multivitamin with minerals Tabs tablet Take 1 tablet by mouth daily.   multivitamin-lutein Caps capsule Take 1 capsule by mouth daily.   mupirocin ointment 2 % Commonly known as:  BACTROBAN Place 1 application into the nose 2 (two) times daily.   ondansetron 4 MG tablet Commonly known as:  ZOFRAN Take 1 tablet (4 mg total) by mouth every 6 (six) hours as needed for nausea.   polyethylene glycol packet Commonly known as:  MIRALAX / GLYCOLAX Take 17 g by mouth  daily as needed. What changed:  reasons to take this   vitamin B-12 1000 MCG tablet Commonly known as:  CYANOCOBALAMIN Take 1,000 mcg by mouth daily.       Diagnostic Studies: Ct Shoulder Right Wo Contrast  Result Date: 05/06/2017 CLINICAL DATA:  Status post fall 2 weeks ago.  Pain and bruising. EXAM: CT OF THE UPPER RIGHT EXTREMITY WITHOUT CONTRAST TECHNIQUE: Multidetector CT imaging of the upper right extremity was performed according to the standard protocol. COMPARISON:  None.  FINDINGS: Bones/Joint/Cartilage Comminuted fracture of the surgical neck of the right proximal humerus with 16 mm of anterior displacement and 11 mm of medial displacement. Fracture cleft extends into the the greater tuberosity without displacement. No articular surface involvement. Ununited fracture of the right lateral third, fourth, fifth and sixth ribs. Healing nondisplaced fractures of the right posterior third, fourth, fifth, sixth, seventh, eighth and ninth ribs. No other fracture or dislocation.  Moderate joint effusion. Ligaments Ligaments are suboptimally evaluated by CT. Muscles and Tendons Muscles are normal.  No muscle atrophy. Soft tissue No fluid collection or hematoma.  No soft tissue mass. IMPRESSION: 1. Comminuted fracture of the surgical neck of the right proximal humerus with 16 mm of anterior displacement and 11 mm of medial displacement. Fracture cleft extends into the the greater tuberosity without displacement. Electronically Signed   By: Kathreen Devoid   On: 05/06/2017 20:03   Dg Shoulder Right Port  Result Date: 05/26/2017 CLINICAL DATA:  Status post right shoulder arthroplasty EXAM: PORTABLE RIGHT SHOULDER: 2 V COMPARISON:  CT right shoulder May 06, 2017 FINDINGS: Frontal and Y scapular images show a total shoulder replacement with prosthetic components well-seated. No acute fracture or dislocation. Old healed fracture mid right clavicle. There is osteoarthritic change in the acromioclavicular  joint. Soft tissue air in the joint is an expected postoperative finding. There old healed rib fractures on the right. Visualized right lung is clear. IMPRESSION: Total shoulder replacement right with prosthetic components appearing well seated. No acute fracture or dislocation. Old healed fracture mid right clavicle with inferior displacement of the distal fracture fragment with approximately 3 cm of overriding. Osteoarthritic change noted in the right acromioclavicular joint. Old healed rib fractures on the right noted. Electronically Signed   By: Lowella Grip III M.D.   On: 05/26/2017 15:24    Disposition: 01-Home or Self Care  Discharge Instructions    Call MD / Call 911   Complete by:  As directed    If you experience chest pain or shortness of breath, CALL 911 and be transported to the hospital emergency room.  If you develope a fever above 101 F, pus (white drainage) or increased drainage or redness at the wound, or calf pain, call your surgeon's office.   Constipation Prevention   Complete by:  As directed    Drink plenty of fluids.  Prune juice may be helpful.  You may use a stool softener, such as Colace (over the counter) 100 mg twice a day.  Use MiraLax (over the counter) for constipation as needed.   Diet - low sodium heart healthy   Complete by:  As directed    Discharge instructions   Complete by:  As directed    Ok to shower - dressing waterproof Ok for elbow range of motion exercises Return to clinic next week Friday am   Increase activity slowly as tolerated   Complete by:  As directed          Signed: Anderson Malta 06/05/2017, 11:14 AM

## 2017-06-06 NOTE — Progress Notes (Signed)
Post-Op Visit Note   Patient: Danielle [name]           Date of Birth: 04-02-1932           MRN: [id number] Visit Date: 06/05/2017 PCP: Sinda Du, MD   Assessment & Plan:  Chief Complaint:  Chief Complaint  Patient presents with  . Right Shoulder - Routine Post Op   Visit Diagnoses:  1. Status post reverse arthroplasty of right shoulder     Plan: Patient presents now a week out reverse shoulder replacement for nonunion fracture.  States she has good and bad days.  She is in home health physical therapy 2-3 times a week.  She is not doing the CPM machine.  She has been taking oxycodone with marginal relief.  She does have MiraLAX which she is taking.  On examination she has swelling in hand and forearm which is improved compared to the hospital.  Her deltoid fires.  Radial pulses intact on the right.  There is no grinding or crepitus with internal and external rotation of the arm.  Impression is 1 week out right shoulder reverse replacement in a patient who has plan is for work on elbow range of motion.  She is getting a little bit stiff in the elbow and I like for her to work on straightening the elbow out.  We can add some pendulum exercises but I want to hold off on any type of active motion for at least 2 more weeks.  Back in 2 weeks for clinical recheck well.  Follow-Up Instructions: Return in about 2 weeks (around 06/19/2017).   Orders:  Orders Placed This Encounter  Procedures  . XR Shoulder Right   Meds ordered this encounter  Medications  . oxycodone (OXY-IR) 5 MG capsule    Sig: 1 po q 6 hrs prn    Dispense:  40 capsule    Refill:  0    Imaging: Xr Shoulder Right  Result Date: 06/06/2017 AP lateral right humerus reviewed.  Reverse shoulder replacement in good position and alignment.  On the AP view there is one cortical irregularity at the midshaft of the humerus which is not seen on the lateral.  No evidence of dislocation or hardware  complication.   PMFS History: Patient Active Problem List   Diagnosis Date Noted  . Uncontrolled type 2 diabetes mellitus with diabetic nephropathy, with long-term current use of insulin ([place])   . UTI due to extended-spectrum beta lactamase (ESBL) producing Escherichia coli   . Pneumothorax 02/05/2017  . Multiple rib fractures 02/05/2017  . Type 2 diabetes mellitus with hypoglycemia without coma ([place]) 02/05/2017  . Clavicle fracture 02/05/2017  . Elevated blood pressure reading 02/05/2017  . UTI (urinary tract infection) 02/05/2017  . Back pain at L4-L5 level 06/14/2013  . Muscle weakness (generalized) 12/17/2012  . Pain in joint, shoulder region 12/17/2012  . Shoulder fracture 12/14/2012  . Proximal humerus fracture 11/29/2012  . FRACTURE, TOE, RIGHT 09/10/2009   Past Medical History:  Diagnosis Date  . Arthritis    "some joint pain once in awhile" (05/26/2017)  . History of kidney stones   . Hypertension   . Hypothyroidism (acquired)   . Pneumothorax 02/05/2017   right-after fall  . Rib fractures 02/05/2017   right side  . Type II diabetes mellitus ([place])     History reviewed. No pertinent family history.  Past Surgical History:  Procedure Laterality Date  . AUGMENTATION MAMMAPLASTY Bilateral   . DILATION AND CURETTAGE  OF UTERUS    . FRACTURE SURGERY    . REVERSE SHOULDER ARTHROPLASTY Right 05/26/2017  . REVERSE SHOULDER ARTHROPLASTY Right 05/26/2017   Procedure: REVERSE RIGHT SHOULDER ARTHROPLASTY;  Surgeon: Meredith Pel, MD;  Location: [facility];  Service: Orthopedics;  Laterality: Right;  . TONSILLECTOMY    . TUBAL LIGATION     Social History   Occupational History  . Not on file  Tobacco Use  . Smoking status: Former Smoker    Packs/day: 1.00    Years: 40.00    Pack years: 40.00    Types: Cigarettes    Last attempt to quit: 1991    Years since quitting: 28.0  . Smokeless tobacco: Never Used  Substance and Sexual Activity  . Alcohol use: No  . Drug use:  No  . Sexual activity: Not on file

## 2017-06-08 NOTE — Telephone Encounter (Signed)
IC verbal given.  

## 2017-06-08 NOTE — Telephone Encounter (Signed)
ok 

## 2017-06-08 NOTE — Telephone Encounter (Signed)
Please advise. Thanks.  

## 2017-06-09 ENCOUNTER — Telehealth (INDEPENDENT_AMBULATORY_CARE_PROVIDER_SITE_OTHER): Payer: Self-pay

## 2017-06-09 NOTE — Telephone Encounter (Signed)
Y for nurse

## 2017-06-09 NOTE — Telephone Encounter (Signed)
Cat PT with [facility] called stating that she was requesting orders for nurse to come out and look at patients elbow. Apparently since patient was seen for follow up of right shoulder on Friday, she has developed a pressure ulcer from wearing the sling. Please advise if you are ok with nurse to eval the patient or if you want me to work her in to see you tomorrow.   Contact for P.T. [phone]

## 2017-06-09 NOTE — Telephone Encounter (Signed)
[place] for [facility], [place] and advised.

## 2017-06-18 ENCOUNTER — Encounter (INDEPENDENT_AMBULATORY_CARE_PROVIDER_SITE_OTHER): Payer: Self-pay | Admitting: Orthopedic Surgery

## 2017-06-18 ENCOUNTER — Ambulatory Visit (INDEPENDENT_AMBULATORY_CARE_PROVIDER_SITE_OTHER): Payer: Medicare Other | Admitting: Orthopedic Surgery

## 2017-06-18 ENCOUNTER — Ambulatory Visit (INDEPENDENT_AMBULATORY_CARE_PROVIDER_SITE_OTHER): Payer: Medicare Other

## 2017-06-18 DIAGNOSIS — S42201D Unspecified fracture of upper end of right humerus, subsequent encounter for fracture with routine healing: Secondary | ICD-10-CM

## 2017-06-18 NOTE — Progress Notes (Signed)
   Post-Op Visit Note   Patient: Danielle [name]           Date of Birth: Oct 22, 1931           MRN: [id number] Visit Date: 06/18/2017 PCP: Danielle Du, MD   Assessment & Plan:  Chief Complaint:  Chief Complaint  Patient presents with  . Right Shoulder - Routine Post Op   Visit Diagnoses:  1. Closed fracture of proximal end of right humerus with routine healing, unspecified fracture morphology, subsequent encounter     Plan: Danielle [name] presents 3 weeks out reverse shoulder replacement.  She is doing recently well.  On exam I can passively forward flexion abductor to 90 degrees.  Deltoid fires.  Radiographs look good.  Plan is to continue home health physical therapy and transition outpatient physical therapy.  Okay for range of motion active and passive as tolerated.  She is out of the sling.  Follow-Up Instructions: No Follow-up on file.   Orders:  Orders Placed This Encounter  Procedures  . XR Humerus Right   No orders of the defined types were placed in this encounter.   Imaging: Xr Humerus Right  Result Date: 06/18/2017 2 views right proximal humerus shows reverse shoulder prosthesis to be in good position and alignment.  No complicating features.  No fractures.    PMFS History: Patient Active Problem List   Diagnosis Date Noted  . Uncontrolled type 2 diabetes mellitus with diabetic nephropathy, with long-term current use of insulin ([place])   . UTI due to extended-spectrum beta lactamase (ESBL) producing Escherichia coli   . Pneumothorax 02/05/2017  . Multiple rib fractures 02/05/2017  . Type 2 diabetes mellitus with hypoglycemia without coma ([place]) 02/05/2017  . Clavicle fracture 02/05/2017  . Elevated blood pressure reading 02/05/2017  . UTI (urinary tract infection) 02/05/2017  . Back pain at L4-L5 level 06/14/2013  . Muscle weakness (generalized) 12/17/2012  . Pain in joint, shoulder region 12/17/2012  . Shoulder fracture 12/14/2012  . Proximal humerus  fracture 11/29/2012  . FRACTURE, TOE, RIGHT 09/10/2009   Past Medical History:  Diagnosis Date  . Arthritis    "some joint pain once in awhile" (05/26/2017)  . History of kidney stones   . Hypertension   . Hypothyroidism (acquired)   . Pneumothorax 02/05/2017   right-after fall  . Rib fractures 02/05/2017   right side  . Type II diabetes mellitus ([place])     History reviewed. No pertinent family history.  Past Surgical History:  Procedure Laterality Date  . AUGMENTATION MAMMAPLASTY Bilateral   . DILATION AND CURETTAGE OF UTERUS    . FRACTURE SURGERY    . REVERSE SHOULDER ARTHROPLASTY Right 05/26/2017  . REVERSE SHOULDER ARTHROPLASTY Right 05/26/2017   Procedure: REVERSE RIGHT SHOULDER ARTHROPLASTY;  Surgeon: Meredith Pel, MD;  Location: [facility];  Service: Orthopedics;  Laterality: Right;  . TONSILLECTOMY    . TUBAL LIGATION     Social History   Occupational History  . Not on file  Tobacco Use  . Smoking status: Former Smoker    Packs/day: 1.00    Years: 40.00    Pack years: 40.00    Types: Cigarettes    Last attempt to quit: 1991    Years since quitting: 28.1  . Smokeless tobacco: Never Used  Substance and Sexual Activity  . Alcohol use: No  . Drug use: No  . Sexual activity: Not on file

## 2017-06-29 ENCOUNTER — Telehealth (INDEPENDENT_AMBULATORY_CARE_PROVIDER_SITE_OTHER): Payer: Self-pay | Admitting: Radiology

## 2017-06-29 NOTE — Telephone Encounter (Signed)
Please advise 

## 2017-06-29 NOTE — Telephone Encounter (Signed)
Cat from [facility] called, she states that she is scheduled to finish up with Ms. Taliercio's HHOT this week.  She is asking if Dr. Marlou Sa would like her to continue [facility] or if he would like her to be discharged to Outpatient OT. If he would like to continue with HHOT then she would like to get verbal orders for 2 times per week for 4 weeks and she said this would get her to her next appointment with Dr. Marlou Sa.  Please give Cat a call back @ [phone] to advise and she states that its ok to [facility]  As it is confidential.

## 2017-06-29 NOTE — Telephone Encounter (Signed)
[place] for hhpt or outpt pt - does she have a ride

## 2017-06-30 ENCOUNTER — Telehealth (INDEPENDENT_AMBULATORY_CARE_PROVIDER_SITE_OTHER): Payer: Self-pay

## 2017-06-30 NOTE — Telephone Encounter (Signed)
IC LMVM advising ok for either, and to call if she wants to go to OP PT and we can send order.

## 2017-06-30 NOTE — Telephone Encounter (Signed)
Kat OT with [facility] called and questions if the pt should continue with [facility] OT or transition to outpatient OT. If you are to continue with home therapy she would like order for 2x q wk for 4 week which will lead to her next appt with Dr. Marlou Sa. Please call and advise ok to lm on vm

## 2017-06-30 NOTE — Telephone Encounter (Signed)
Tried calling patient. No answer LM for her [facility] to discuss whether or not she is able to transition to outpt P.T.  I also called Kat back, and LM for making her aware.

## 2017-06-30 NOTE — Telephone Encounter (Signed)
Can she go to outpatient?

## 2017-06-30 NOTE — Telephone Encounter (Signed)
Y thx

## 2017-06-30 NOTE — Telephone Encounter (Signed)
I s/w patient and she stated that she was not able to drive herself yet due to her right arm. Do you want HHPT to continue for a few more weeks?

## 2017-06-30 NOTE — Telephone Encounter (Signed)
IC s/w therapist. She will continue HHPT for 2 more weeks then let us know so we can send patient to outpt therapy.

## 2017-06-30 NOTE — Telephone Encounter (Signed)
Please advise. Thanks.  

## 2017-07-13 ENCOUNTER — Telehealth (INDEPENDENT_AMBULATORY_CARE_PROVIDER_SITE_OTHER): Payer: Self-pay | Admitting: *Deleted

## 2017-07-13 DIAGNOSIS — Z471 Aftercare following joint replacement surgery: Secondary | ICD-10-CM

## 2017-07-13 DIAGNOSIS — Z96611 Presence of right artificial shoulder joint: Principal | ICD-10-CM

## 2017-07-13 NOTE — Telephone Encounter (Signed)
Received call from [place]- stating it is time for pt to have out pt therapy, pt wants to go to [facility] pt. Please fax referral to [facility] OT so pt can be scheduled for OT.

## 2017-07-13 NOTE — Telephone Encounter (Signed)
Order placed

## 2017-07-13 NOTE — Telephone Encounter (Signed)
I can put referral in if you will please advise on restrictions/precautions.

## 2017-07-13 NOTE — Telephone Encounter (Signed)
[place] for prom only no strengthening yet pls cal lthx

## 2017-07-15 ENCOUNTER — Telehealth (INDEPENDENT_AMBULATORY_CARE_PROVIDER_SITE_OTHER): Payer: Self-pay | Admitting: Orthopedic Surgery

## 2017-07-15 NOTE — Telephone Encounter (Signed)
This was already put into the system for them.

## 2017-07-15 NOTE — Telephone Encounter (Signed)
IC advised I had already submitted order to [facility] out patient therapy.

## 2017-07-15 NOTE — Telephone Encounter (Signed)
Wendelyn Breslow, a OT with [facility] called requesting that an order for OT rehab be faxed to [facility] in [place].  She did not have their fax number but the telephone number is [phone].  Her CB#[phone]

## 2017-07-22 ENCOUNTER — Ambulatory Visit (HOSPITAL_COMMUNITY): Payer: Medicare Other | Attending: Orthopedic Surgery | Admitting: Occupational Therapy

## 2017-07-22 ENCOUNTER — Other Ambulatory Visit: Payer: Self-pay

## 2017-07-22 ENCOUNTER — Encounter (HOSPITAL_COMMUNITY): Payer: Self-pay | Admitting: Occupational Therapy

## 2017-07-22 DIAGNOSIS — M25611 Stiffness of right shoulder, not elsewhere classified: Secondary | ICD-10-CM | POA: Diagnosis present

## 2017-07-22 DIAGNOSIS — R29898 Other symptoms and signs involving the musculoskeletal system: Secondary | ICD-10-CM | POA: Diagnosis present

## 2017-07-22 DIAGNOSIS — M25511 Pain in right shoulder: Secondary | ICD-10-CM | POA: Diagnosis not present

## 2017-07-22 NOTE — Therapy (Signed)
[place], [place], [address] Phone: [phone]   Fax:  [phone]  Occupational Therapy Evaluation  Patient Details  Name: Danielle [name] MRN: [id number] Date of Birth: [birth year] Referring Provider: Dr. Meredith Pel   Encounter Date: 07/22/2017  OT End of Session - 07/22/17 1135    Visit Number  1    Number of Visits  16    Date for OT Re-Evaluation  09/20/17 Mini-reassessment on 08/21/17    Authorization Type  [employer] Medicare    Authorization Time Period  $40 copay    OT Start Time  1034    OT Stop Time  1112    OT Time Calculation (min)  38 min    Activity Tolerance  Patient tolerated treatment well    Behavior During Therapy  [facility] for tasks assessed/performed       Past Medical History:  Diagnosis Date  . Arthritis    "some joint pain once in awhile" (05/26/2017)  . History of kidney stones   . Hypertension   . Hypothyroidism (acquired)   . Pneumothorax 02/05/2017   right-after fall  . Rib fractures 02/05/2017   right side  . Type II diabetes mellitus ([place])     Past Surgical History:  Procedure Laterality Date  . AUGMENTATION MAMMAPLASTY Bilateral   . DILATION AND CURETTAGE OF UTERUS    . FRACTURE SURGERY    . REVERSE SHOULDER ARTHROPLASTY Right 05/26/2017  . REVERSE SHOULDER ARTHROPLASTY Right 05/26/2017   Procedure: REVERSE RIGHT SHOULDER ARTHROPLASTY;  Surgeon: Meredith Pel, MD;  Location: [place];  Service: Orthopedics;  Laterality: Right;  . TONSILLECTOMY    . TUBAL LIGATION      There were no vitals filed for this visit.  Subjective Assessment - 07/22/17 1131    Subjective   S: It's been doing really well.     Patient is accompained by:  Family member husband    Pertinent History  Pt is an 84 y/o female s/p right reverse TSA on 05/26/17 presenting for evaluation after 6-8 weeks of [facility] services. Pt initially injured her shoulder after a fall on 04/25/18. Pt is taking pain  medication occasionally, otherwise is doing well. Pt was referred to occupational therapy for evaluation and treatment by Dr. Meredith Pel    Special Tests  FOTO Score: 52/100 (48% impairment)    Patient Stated Goals  To regain full use of my right arm.     Currently in Pain?  No/denies        [facility] OT Assessment - 07/22/17 1027      Assessment   Medical Diagnosis  s/p Right reverse TSA    Referring Provider  Dr. Meredith Pel    Onset Date/Surgical Date  05/26/17    Hand Dominance  Right    Next MD Visit  07/29/2017    Prior Therapy  [facility] OT for approximately 6-8 weeks      Precautions   Precautions  Shoulder    Type of Shoulder Precautions  P/ROM only, no strengthening      Balance Screen   Has the patient fallen in the past 6 months  Yes    How many times?  1    Has the patient had a decrease in activity level because of a fear of falling?   No    Is the patient reluctant to leave their home because of a fear of falling?   No  Prior Function   Level of Independence  Independent with basic ADLs    Vocation  Retired    Leisure  walking      ADL   ADL comments  Pt is having difficulty with reaching overhead, reaching behind back, sleeping, unable to drive, grooming-washing/fixing hair, cooking      Written Expression   Dominant Hand  Right      Observation/Other Assessments   Focus on Therapeutic Outcomes (FOTO)   52/100      ROM / Strength   AROM / PROM / Strength  AROM;PROM;Strength      Palpation   Palpation comment  Moderate facial restictions along anterior shoulder regions      AROM   Overall AROM   Unable to assess;Due to precautions      PROM   Overall PROM Comments  Assessed supine, er/IR adducted    PROM Assessment Site  Shoulder    Right/Left Shoulder  Right    Right Shoulder Flexion  130 Degrees    Right Shoulder ABduction  130 Degrees    Right Shoulder Internal Rotation  90 Degrees    Right Shoulder External Rotation  70 Degrees       Strength   Overall Strength  Unable to assess;Due to precautions                      OT Education - 07/22/17 1105    Education provided  Yes    Education Details  table slides    Person(s) Educated  Patient;Spouse    Methods  Explanation;Demonstration;Handout    Comprehension  Verbalized understanding;Returned demonstration       OT Short Term Goals - 07/22/17 1139      OT SHORT TERM GOAL #1   Title  Pt will be provided with and educated on HEP for improved mobility of RUE during ADL completion.     Time  4    Period  Weeks    Status  New    Target Date  08/21/17      OT SHORT TERM GOAL #2   Title  Pt will improve RUE P/ROM to [facility] to improve ability to use RUE as assist with dressing tasks.     Time  4    Period  Weeks    Status  New      OT SHORT TERM GOAL #3   Title  Pt will improve RUE strength to 3+/5 to improve ability to reach for and carry lightweight items at waist height.     Time  4    Period  Weeks    Status  New        OT Long Term Goals - 07/22/17 1142      OT LONG TERM GOAL #1   Title  Pt will return to highest level of functioning during B/IADLs using RUE as dominant.     Time  8    Period  Weeks    Status  New    Target Date  09/20/17      OT LONG TERM GOAL #2   Title  Pt will decrease pain in RUE to 2/10 or less to improve ability to sleep comfortably.     Time  8    Period  Weeks    Status  New      OT LONG TERM GOAL #3   Title  Pt will decrease RUE fascial restrictions to minimal amounts or less to improve  mobility required for functional reaching tasks.     Time  8    Period  Weeks    Status  New      OT LONG TERM GOAL #4   Title  Pt will improve RUE A/ROM to [facility] to improve ability to reach up and wash/comb/fix hair.     Time  8    Period  Weeks    Status  New      OT LONG TERM GOAL #5   Title  Pt will improve RUE strength to 4/5 or [name] to improve ability to perform cooking tasks.     Time  8    Period   Weeks    Status  New            Plan - 07/22/17 1136    Clinical Impression Statement  A: Pt is an 84 y/o female s/p right reverse TSA on 05/26/17 after sustaining a proximal humerus fx with non-union from a fall on 04/25/18. Pt presents with a decrease in functional task completion using RUE as dominant. Pt provided with and educated on HEP.     Occupational Profile and client history currently impacting functional performance  Pt with active lifestyle prior to fall and is motivated to return to highest level of functioning    Occupational performance deficits (Please refer to evaluation for details):  ADL's;IADL's;Rest and Sleep;Leisure;Social Participation    Rehab Potential  Good    OT Frequency  2x / week    OT Duration  8 weeks    OT Treatment/Interventions  Self-care/ADL training;Therapeutic exercise;Ultrasound;Manual Therapy;Therapeutic activities;Cryotherapy;Electrical Stimulation;Moist Heat;Passive range of motion;Patient/family education    Plan  P: Pt will benefit from skilled OT services to decrease RUE pain and fascial restrictions, increase ROM, strength, and functional use of RUE as dominant. Treatment plan: myofascial release, manual therapy, P/ROM, AA/ROM, A/ROM, scapular stability and strengthening, general RUE strengthening    Clinical Decision Making  Limited treatment options, no task modification necessary    OT Home Exercise Plan  07/22/2017: table slides and reviewed HEP from [place] and Agree with Plan of Care  Patient       Patient will benefit from skilled therapeutic intervention in order to improve the following deficits and impairments:  Decreased activity tolerance, Decreased strength, Impaired flexibility, Decreased range of motion, Pain, Increased fascial restrictions, Impaired UE functional use  Visit Diagnosis: Acute pain of right shoulder  Stiffness of right shoulder, not elsewhere classified  Other symptoms and signs involving the  musculoskeletal system    Problem List Patient Active Problem List   Diagnosis Date Noted  . Uncontrolled type 2 diabetes mellitus with diabetic nephropathy, with long-term current use of insulin ([place])   . UTI due to extended-spectrum beta lactamase (ESBL) producing Escherichia coli   . Pneumothorax 02/05/2017  . Multiple rib fractures 02/05/2017  . Type 2 diabetes mellitus with hypoglycemia without coma ([place]) 02/05/2017  . Clavicle fracture 02/05/2017  . Elevated blood pressure reading 02/05/2017  . UTI (urinary tract infection) 02/05/2017  . Back pain at L4-L5 level 06/14/2013  . Muscle weakness (generalized) 12/17/2012  . Pain in joint, shoulder region 12/17/2012  . Shoulder fracture 12/14/2012  . Proximal humerus fracture 11/29/2012  . FRACTURE, TOE, RIGHT 09/10/2009   Guadelupe Sabin, OTR/L  [phone] 07/22/2017, 11:48 AM  [place], [place], [address] Phone: [phone]   Fax:  [phone]  Name: Danielle [name] MRN:  [id number] Date of Birth: Dec 09, 1931

## 2017-07-22 NOTE — Patient Instructions (Signed)
SHOULDER: Flexion On Table   Place hands on table, elbows straight. Move hips away from body. Press hands down into table.  _15__ reps per set, _5_ sets per day  Abduction (Passive)   With arm out to side, resting on table, lower head toward arm, keeping trunk away from table.  Repeat __15__ times. Do _5___ sessions per day.  Copyright  VHI. All rights reserved.     Internal Rotation (Assistive)   Seated with elbow bent at right angle and held against side, slide arm on table surface in an inward arc. Repeat _15___ times. Do __5__ sessions per day. Activity: Use this motion to brush crumbs off the table.  Copyright  VHI. All rights reserved.

## 2017-07-23 ENCOUNTER — Ambulatory Visit (HOSPITAL_COMMUNITY): Payer: Medicare Other | Admitting: Specialist

## 2017-07-23 ENCOUNTER — Encounter (HOSPITAL_COMMUNITY): Payer: Self-pay | Admitting: Specialist

## 2017-07-23 DIAGNOSIS — M25511 Pain in right shoulder: Secondary | ICD-10-CM | POA: Diagnosis not present

## 2017-07-23 DIAGNOSIS — M25611 Stiffness of right shoulder, not elsewhere classified: Secondary | ICD-10-CM

## 2017-07-23 DIAGNOSIS — R29898 Other symptoms and signs involving the musculoskeletal system: Secondary | ICD-10-CM

## 2017-07-23 NOTE — Therapy (Signed)
[place], [place], [address] Phone: [phone]   Fax:  [phone]  Occupational Therapy Treatment  Patient Details  Name: Danielle [name] MRN: [id number] Date of Birth: 29-Mar-1932 Referring Provider: Dr. Meredith Pel   Encounter Date: 07/23/2017  OT End of Session - 07/23/17 2132    Visit Number  2    Number of Visits  16    Date for OT Re-Evaluation  09/20/17 mini reassess on 4/5    Authorization Type  UHC Medicare    Authorization Time Period  $40 copay    OT Start Time  1435    OT Stop Time  1515    OT Time Calculation (min)  40 min    Activity Tolerance  Patient tolerated treatment well    Behavior During Therapy  [facility] for tasks assessed/performed       Past Medical History:  Diagnosis Date  . Arthritis    "some joint pain once in awhile" (05/26/2017)  . History of kidney stones   . Hypertension   . Hypothyroidism (acquired)   . Pneumothorax 02/05/2017   right-after fall  . Rib fractures 02/05/2017   right side  . Type II diabetes mellitus ([place])     Past Surgical History:  Procedure Laterality Date  . AUGMENTATION MAMMAPLASTY Bilateral   . DILATION AND CURETTAGE OF UTERUS    . FRACTURE SURGERY    . REVERSE SHOULDER ARTHROPLASTY Right 05/26/2017  . REVERSE SHOULDER ARTHROPLASTY Right 05/26/2017   Procedure: REVERSE RIGHT SHOULDER ARTHROPLASTY;  Surgeon: Meredith Pel, MD;  Location: [place];  Service: Orthopedics;  Laterality: Right;  . TONSILLECTOMY    . TUBAL LIGATION      There were no vitals filed for this visit.  Subjective Assessment - 07/23/17 2131    Subjective   S:  I practiced my exercises.    Currently in Pain?  No/denies         [facility] OT Assessment - 07/23/17 0001      Assessment   Medical Diagnosis  s/p Right reverse TSA      Precautions   Precautions  Shoulder    Type of Shoulder Precautions  P/ROM only, no strengthening               OT  Treatments/Exercises (OP) - 07/23/17 0001      Exercises   Exercises  Shoulder      Shoulder Exercises: Supine   Protraction  PROM;AAROM;10 reps    Horizontal ABduction  PROM;AAROM;10 reps    External Rotation  PROM;AAROM;10 reps    Internal Rotation  PROM;AAROM;10 reps    Flexion  PROM;AAROM;10 reps    ABduction  PROM;AAROM;10 reps      Shoulder Exercises: Seated   Elevation  AROM;10 reps    Extension  AROM;10 reps    Row  AROM;10 reps      Shoulder Exercises: Pulleys   Flexion  1 minute    ABduction  1 minute      Shoulder Exercises: Therapy Ball   Flexion  15 reps    ABduction  15 reps      Shoulder Exercises: ROM/Strengthening   Thumb Tacks  1 minute    Prot/Ret//Elev/Dep  1 minute      Manual Therapy   Manual Therapy  Myofascial release    Manual therapy comments  manual therapy interventions completed seperately from all other interventions this date of service.  Myofascial Release  myofascial release manual stretching to right upper arm, scapular and shoulder region to decrease pain and fascial restrictions and improve pain free range of motion.  scar release to surgical scar             OT Education - 07/23/17 2132    Education provided  Yes    Education Details  reviewed plan of care    Person(s) Educated  Patient    Methods  Explanation;Handout    Comprehension  Verbalized understanding       OT Short Term Goals - 07/23/17 2136      OT SHORT TERM GOAL #1   Title  Pt will be provided with and educated on HEP for improved mobility of RUE during ADL completion.     Time  4    Period  Weeks    Status  On-going      OT SHORT TERM GOAL #2   Title  Pt will improve RUE P/ROM to [facility] to improve ability to use RUE as assist with dressing tasks.     Time  4    Period  Weeks    Status  On-going      OT SHORT TERM GOAL #3   Title  Pt will improve RUE strength to 3+/5 to improve ability to reach for and carry lightweight items at waist height.      Time  4    Period  Weeks    Status  On-going        OT Long Term Goals - 07/23/17 2136      OT LONG TERM GOAL #1   Title  Pt will return to highest level of functioning during B/IADLs using RUE as dominant.     Time  8    Period  Weeks    Status  On-going      OT LONG TERM GOAL #2   Title  Pt will decrease pain in RUE to 2/10 or less to improve ability to sleep comfortably.     Time  8    Period  Weeks    Status  On-going      OT LONG TERM GOAL #3   Title  Pt will decrease RUE fascial restrictions to minimal amounts or less to improve mobility required for functional reaching tasks.     Time  8    Period  Weeks    Status  On-going      OT LONG TERM GOAL #4   Title  Pt will improve RUE A/ROM to [facility] to improve ability to reach up and wash/comb/fix hair.     Time  8    Period  Weeks    Status  On-going      OT LONG TERM GOAL #5   Title  Pt will improve RUE strength to 4/5 or better to improve ability to perform cooking tasks.     Time  8    Period  Weeks    Status  On-going            Plan - 07/23/17 2133    Clinical Impression Statement  A:  Patient began therapeutic exercises this date, P/ROM and AA/ROM in supine.  Patient experiencing minimal pain during exercises.  Patient required minimal verbal cuing throughout treatment session to avoid compensatory movements.      Plan  P:  Continue to improve AA/ROM and P/ROM per protocol in order to use right arm with funcitonal activities.  Patient will benefit from skilled therapeutic intervention in order to improve the following deficits and impairments:  Decreased activity tolerance, Decreased strength, Impaired flexibility, Decreased range of motion, Pain, Increased fascial restrictions, Impaired UE functional use  Visit Diagnosis: Acute pain of right shoulder  Stiffness of right shoulder, not elsewhere classified  Other symptoms and signs involving the musculoskeletal system    Problem List Patient  Active Problem List   Diagnosis Date Noted  . Uncontrolled type 2 diabetes mellitus with diabetic nephropathy, with long-term current use of insulin ([place])   . UTI due to extended-spectrum beta lactamase (ESBL) producing Escherichia coli   . Pneumothorax 02/05/2017  . Multiple rib fractures 02/05/2017  . Type 2 diabetes mellitus with hypoglycemia without coma ([place]) 02/05/2017  . Clavicle fracture 02/05/2017  . Elevated blood pressure reading 02/05/2017  . UTI (urinary tract infection) 02/05/2017  . Back pain at L4-L5 level 06/14/2013  . Muscle weakness (generalized) 12/17/2012  . Pain in joint, shoulder region 12/17/2012  . Shoulder fracture 12/14/2012  . Proximal humerus fracture 11/29/2012  . FRACTURE, TOE, RIGHT 09/10/2009    Vangie Bicker, [place], OTR/L [phone]  07/23/2017, 9:41 PM  [place], [place], [address] Phone: [phone]   Fax:  [phone]  Name: ATHENIA RYS MRN: [id number] Date of Birth: Feb 26, 1932

## 2017-07-29 ENCOUNTER — Ambulatory Visit (HOSPITAL_COMMUNITY): Payer: Medicare Other | Admitting: Specialist

## 2017-07-29 ENCOUNTER — Ambulatory Visit (INDEPENDENT_AMBULATORY_CARE_PROVIDER_SITE_OTHER): Payer: Medicare Other | Admitting: Orthopedic Surgery

## 2017-07-29 ENCOUNTER — Encounter (INDEPENDENT_AMBULATORY_CARE_PROVIDER_SITE_OTHER): Payer: Self-pay | Admitting: Orthopedic Surgery

## 2017-07-29 ENCOUNTER — Encounter (HOSPITAL_COMMUNITY): Payer: Self-pay | Admitting: Specialist

## 2017-07-29 DIAGNOSIS — Z96611 Presence of right artificial shoulder joint: Secondary | ICD-10-CM

## 2017-07-29 DIAGNOSIS — M25611 Stiffness of right shoulder, not elsewhere classified: Secondary | ICD-10-CM

## 2017-07-29 DIAGNOSIS — M25511 Pain in right shoulder: Secondary | ICD-10-CM | POA: Diagnosis not present

## 2017-07-29 DIAGNOSIS — Z471 Aftercare following joint replacement surgery: Secondary | ICD-10-CM

## 2017-07-29 DIAGNOSIS — R29898 Other symptoms and signs involving the musculoskeletal system: Secondary | ICD-10-CM

## 2017-07-29 NOTE — Therapy (Signed)
[place], [place], [address] Phone: [phone]   Fax:  [phone]  Occupational Therapy Treatment  Patient Details  Name: Danielle [name] MRN: [id number] Date of Birth: 1931/10/25 Referring Provider: Dr. Meredith Pel   Encounter Date: 07/29/2017  OT End of Session - 07/29/17 1518    Visit Number  3    Number of Visits  16    Date for OT Re-Evaluation  09/20/17    Authorization Type  UHC Medicare    Authorization Time Period  $40 copay    OT Start Time  1355    OT Stop Time  1435    OT Time Calculation (min)  40 min    Activity Tolerance  Patient tolerated treatment well    Behavior During Therapy  [facility] for tasks assessed/performed       Past Medical History:  Diagnosis Date  . Arthritis    "some joint pain once in awhile" (05/26/2017)  . History of kidney stones   . Hypertension   . Hypothyroidism (acquired)   . Pneumothorax 02/05/2017   right-after fall  . Rib fractures 02/05/2017   right side  . Type II diabetes mellitus ([place])     Past Surgical History:  Procedure Laterality Date  . AUGMENTATION MAMMAPLASTY Bilateral   . DILATION AND CURETTAGE OF UTERUS    . FRACTURE SURGERY    . REVERSE SHOULDER ARTHROPLASTY Right 05/26/2017  . REVERSE SHOULDER ARTHROPLASTY Right 05/26/2017   Procedure: REVERSE RIGHT SHOULDER ARTHROPLASTY;  Surgeon: Meredith Pel, MD;  Location: [place];  Service: Orthopedics;  Laterality: Right;  . TONSILLECTOMY    . TUBAL LIGATION      There were no vitals filed for this visit.  Subjective Assessment - 07/29/17 1518    Subjective   S:  I went to the MD today and he said I could start doing active exercises for the next 4 weeks     Currently in Pain?  No/denies         [facility] OT Assessment - 07/29/17 0001      Assessment   Medical Diagnosis  s/p Right reverse TSA      Precautions   Precautions  Shoulder    Type of Shoulder Precautions  Per MD:  A/ROM  ok from 3/13-4/10, 4/11 2 weeks of strengthening and then dc to HEP               OT Treatments/Exercises (OP) - 07/29/17 0001      Exercises   Exercises  Shoulder      Shoulder Exercises: Supine   Protraction  PROM;10 reps;AAROM;12 reps    Horizontal ABduction  PROM;10 reps;AAROM;12 reps    External Rotation  PROM;10 reps;AAROM;12 reps    Internal Rotation  PROM;10 reps;AAROM;12 reps    Flexion  PROM;10 reps;AAROM;12 reps    ABduction  PROM;10 reps;AAROM;12 reps      Shoulder Exercises: Seated   Elevation  AROM;10 reps    Extension  AROM;10 reps    Row  AROM;10 reps      Shoulder Exercises: Standing   Protraction  AAROM;10 reps    Horizontal ABduction  AAROM;10 reps    External Rotation  AAROM;10 reps    Internal Rotation  AAROM;10 reps    Flexion  AAROM;10 reps    ABduction  AAROM;10 reps      Shoulder Exercises: Therapy Ball   Flexion  15 reps    ABduction  15 reps      Shoulder Exercises: ROM/Strengthening   Other ROM/Strengthening Exercises  pvc pile flexion with trunk rotation in standing 10 times       Manual Therapy   Manual Therapy  Myofascial release    Manual therapy comments  manual therapy interventions completed seperately from all other interventions this date of service.      Myofascial Release  myofascial release manual stretching to right upper arm, scapular and shoulder region to decrease pain and fascial restrictions and improve pain free range of motion.  scar release to surgical scar               OT Short Term Goals - 07/23/17 2136      OT SHORT TERM GOAL #1   Title  Pt will be provided with and educated on HEP for improved mobility of RUE during ADL completion.     Time  4    Period  Weeks    Status  On-going      OT SHORT TERM GOAL #2   Title  Pt will improve RUE P/ROM to [facility] to improve ability to use RUE as assist with dressing tasks.     Time  4    Period  Weeks    Status  On-going      OT SHORT TERM GOAL #3   Title   Pt will improve RUE strength to 3+/5 to improve ability to reach for and carry lightweight items at waist height.     Time  4    Period  Weeks    Status  On-going        OT Long Term Goals - 07/23/17 2136      OT LONG TERM GOAL #1   Title  Pt will return to highest level of functioning during B/IADLs using RUE as dominant.     Time  8    Period  Weeks    Status  On-going      OT LONG TERM GOAL #2   Title  Pt will decrease pain in RUE to 2/10 or less to improve ability to sleep comfortably.     Time  8    Period  Weeks    Status  On-going      OT LONG TERM GOAL #3   Title  Pt will decrease RUE fascial restrictions to minimal amounts or less to improve mobility required for functional reaching tasks.     Time  8    Period  Weeks    Status  On-going      OT LONG TERM GOAL #4   Title  Pt will improve RUE A/ROM to [facility] to improve ability to reach up and wash/comb/fix hair.     Time  8    Period  Weeks    Status  On-going      OT LONG TERM GOAL #5   Title  Pt will improve RUE strength to 4/5 or better to improve ability to perform cooking tasks.     Time  8    Period  Weeks    Status  On-going            Plan - 07/29/17 1519    Clinical Impression Statement  A:  Per MD, ok to complete range of motion (AA/AROM) for the next 4 weeks and then begin strengthening for 2 weeks.  patient completed AA/ROM in supine and seated this date, with flexion and abduction limitied to 60% range.  Plan  P:  Continue AA/ROM in supine and seated, add thumb tacks, prot/ret/elev/dep and wall wash.  add ball circles       Patient will benefit from skilled therapeutic intervention in order to improve the following deficits and impairments:  Decreased activity tolerance, Decreased strength, Impaired flexibility, Decreased range of motion, Pain, Increased fascial restrictions, Impaired UE functional use  Visit Diagnosis: Acute pain of right shoulder  Stiffness of right shoulder, not  elsewhere classified  Other symptoms and signs involving the musculoskeletal system    Problem List Patient Active Problem List   Diagnosis Date Noted  . Uncontrolled type 2 diabetes mellitus with diabetic nephropathy, with long-term current use of insulin ([place])   . UTI due to extended-spectrum beta lactamase (ESBL) producing Escherichia coli   . Pneumothorax 02/05/2017  . Multiple rib fractures 02/05/2017  . Type 2 diabetes mellitus with hypoglycemia without coma ([place]) 02/05/2017  . Clavicle fracture 02/05/2017  . Elevated blood pressure reading 02/05/2017  . UTI (urinary tract infection) 02/05/2017  . Back pain at L4-L5 level 06/14/2013  . Muscle weakness (generalized) 12/17/2012  . Pain in joint, shoulder region 12/17/2012  . Shoulder fracture 12/14/2012  . Proximal humerus fracture 11/29/2012  . FRACTURE, TOE, RIGHT 09/10/2009    Vangie Bicker, [place], OTR/L [phone]  07/29/2017, 3:31 PM  [place], [place], [address] Phone: [phone]   Fax:  [phone]  Name: SHAHRZAD [name] MRN: [id number] Date of Birth: March 05, 1932

## 2017-07-29 NOTE — Progress Notes (Signed)
Post-Op Visit Note   Patient: Danielle [name]           Date of Birth: [birth year]           MRN: [id number] Visit Date: 07/29/2017 PCP: Sinda Du, MD   Assessment & Plan:  Chief Complaint:  Chief Complaint  Patient presents with  . Right Shoulder - Follow-up   Visit Diagnoses:  1. Aftercare following right shoulder joint replacement surgery     Plan: Danielle [name] is an 84 year old patient who is now 2 months out right reverse shoulder replacement.  She is able to brush her hair.  She is in outpatient physical therapy 2-3 times a week.  They have done range of motion only no strengthening yet.  Bone quality was extremely poor at the time of surgery.  On examination she does have forward flexion and abduction at about 90 degrees.  Strength is good and there is pain-free range of motion.  Motor or sensory function to the hand is intact.  Plan at this time is to continue with range of motion only for the next 4 weeks then 2 weeks of strengthening okay plus transition to home exercise program.  I will see her back in 3 months at which time she will likely be plateaued in terms of her range of motion improvement.  Follow-Up Instructions: Return in about 3 months (around 10/29/2017).   Orders:  No orders of the defined types were placed in this encounter.  No orders of the defined types were placed in this encounter.   Imaging: No results found.  PMFS History: Patient Active Problem List   Diagnosis Date Noted  . Uncontrolled type 2 diabetes mellitus with diabetic nephropathy, with long-term current use of insulin ([place])   . UTI due to extended-spectrum beta lactamase (ESBL) producing Escherichia coli   . Pneumothorax 02/05/2017  . Multiple rib fractures 02/05/2017  . Type 2 diabetes mellitus with hypoglycemia without coma ([place]) 02/05/2017  . Clavicle fracture 02/05/2017  . Elevated blood pressure reading 02/05/2017  . UTI (urinary tract infection) 02/05/2017  . Back pain at  L4-L5 level 06/14/2013  . Muscle weakness (generalized) 12/17/2012  . Pain in joint, shoulder region 12/17/2012  . Shoulder fracture 12/14/2012  . Proximal humerus fracture 11/29/2012  . FRACTURE, TOE, RIGHT 09/10/2009   Past Medical History:  Diagnosis Date  . Arthritis    "some joint pain once in awhile" (05/26/2017)  . History of kidney stones   . Hypertension   . Hypothyroidism (acquired)   . Pneumothorax 02/05/2017   right-after fall  . Rib fractures 02/05/2017   right side  . Type II diabetes mellitus ([place])     History reviewed. No pertinent family history.  Past Surgical History:  Procedure Laterality Date  . AUGMENTATION MAMMAPLASTY Bilateral   . DILATION AND CURETTAGE OF UTERUS    . FRACTURE SURGERY    . REVERSE SHOULDER ARTHROPLASTY Right 05/26/2017  . REVERSE SHOULDER ARTHROPLASTY Right 05/26/2017   Procedure: REVERSE RIGHT SHOULDER ARTHROPLASTY;  Surgeon: Meredith Pel, MD;  Location: [facility];  Service: Orthopedics;  Laterality: Right;  . TONSILLECTOMY    . TUBAL LIGATION     Social History   Occupational History  . Not on file  Tobacco Use  . Smoking status: Former Smoker    Packs/day: 1.00    Years: 40.00    Pack years: 40.00    Types: Cigarettes    Last attempt to quit: 1991    Years since quitting:  28.2  . Smokeless tobacco: Never Used  Substance and Sexual Activity  . Alcohol use: No  . Drug use: No  . Sexual activity: Not on file

## 2017-07-31 ENCOUNTER — Ambulatory Visit (HOSPITAL_COMMUNITY): Payer: Medicare Other | Admitting: Occupational Therapy

## 2017-07-31 ENCOUNTER — Encounter (HOSPITAL_COMMUNITY): Payer: Self-pay | Admitting: Occupational Therapy

## 2017-07-31 DIAGNOSIS — M25511 Pain in right shoulder: Secondary | ICD-10-CM | POA: Diagnosis not present

## 2017-07-31 DIAGNOSIS — M25611 Stiffness of right shoulder, not elsewhere classified: Secondary | ICD-10-CM

## 2017-07-31 DIAGNOSIS — R29898 Other symptoms and signs involving the musculoskeletal system: Secondary | ICD-10-CM

## 2017-07-31 NOTE — Therapy (Signed)
[place], [place], [address] Phone: [phone]   Fax:  [phone]  Occupational Therapy Treatment  Patient Details  Name: Danielle [name] MRN: [id number] Date of Birth: 13-Sep-1931 Referring Provider: Dr. Meredith Pel   Encounter Date: 07/31/2017  OT End of Session - 07/31/17 1248    Visit Number  4    Number of Visits  16    Date for OT Re-Evaluation  09/20/17    Authorization Type  [employer]HC Medicare    Authorization Time Period  $40 copay    OT Start Time  1033    OT Stop Time  1119    OT Time Calculation (min)  46 min    Activity Tolerance  Patient tolerated treatment well    Behavior During Therapy  [facility] for tasks assessed/performed       Past Medical History:  Diagnosis Date  . Arthritis    "some joint pain once in awhile" (05/26/2017)  . History of kidney stones   . Hypertension   . Hypothyroidism (acquired)   . Pneumothorax 02/05/2017   right-after fall  . Rib fractures 02/05/2017   right side  . Type II diabetes mellitus ([place])     Past Surgical History:  Procedure Laterality Date  . AUGMENTATION MAMMAPLASTY Bilateral   . DILATION AND CURETTAGE OF UTERUS    . FRACTURE SURGERY    . REVERSE SHOULDER ARTHROPLASTY Right 05/26/2017  . REVERSE SHOULDER ARTHROPLASTY Right 05/26/2017   Procedure: REVERSE RIGHT SHOULDER ARTHROPLASTY;  Surgeon: Meredith Pel, MD;  Location: [facility];  Service: Orthopedics;  Laterality: Right;  . TONSILLECTOMY    . TUBAL LIGATION      There were no vitals filed for this visit.  Subjective Assessment - 07/31/17 1032    Subjective   S: I drove for the first time yesterday.     Currently in Pain?  No/denies         [facility] OT Assessment - 07/31/17 1032      Assessment   Medical Diagnosis  s/p Right reverse TSA      Precautions   Precautions  Shoulder    Type of Shoulder Precautions  Per MD:  A/ROM ok from 3/13-4/10, 4/11 2 weeks of strengthening and  then dc to HEP               OT Treatments/Exercises (OP) - 07/31/17 1036      Exercises   Exercises  Shoulder      Shoulder Exercises: Supine   Protraction  PROM;10 reps;AAROM;12 reps    Horizontal ABduction  PROM;10 reps;AAROM;12 reps    External Rotation  PROM;10 reps;AAROM;12 reps    Internal Rotation  PROM;10 reps;AAROM;12 reps    Flexion  PROM;10 reps;AAROM;12 reps    ABduction  PROM;10 reps;AAROM;12 reps      Shoulder Exercises: Seated   Elevation  AROM;15 reps    Extension  AROM;15 reps    Row  AROM;15 reps      Shoulder Exercises: Standing   Protraction  AAROM;10 reps    Horizontal ABduction  AAROM;10 reps    External Rotation  AAROM;10 reps    Internal Rotation  AAROM;10 reps    Flexion  AAROM;10 reps    ABduction  AAROM;10 reps      Shoulder Exercises: Therapy Ball   Right/Left  -- 3 reps each direction      Shoulder Exercises: ROM/Strengthening   Wall Wash  1'  Thumb Tacks  1 minute    Prot/Ret//Elev/Dep  1 minute      Manual Therapy   Manual Therapy  Myofascial release    Manual therapy comments  manual therapy interventions completed seperately from all other interventions this date of service.      Myofascial Release  myofascial release manual stretching to right upper arm, scapular and shoulder region to decrease pain and fascial restrictions and improve pain free range of motion.  scar release to surgical scar             OT Education - 07/31/17 1103    Education provided  Yes    Education Details  AA/ROM    Person(s) Educated  Patient    Methods  Explanation;Demonstration;Handout    Comprehension  Verbalized understanding;Returned demonstration       OT Short Term Goals - 07/23/17 2136      OT SHORT TERM GOAL #1   Title  Pt will be provided with and educated on HEP for improved mobility of RUE during ADL completion.     Time  4    Period  Weeks    Status  On-going      OT SHORT TERM GOAL #2   Title  Pt will improve RUE  P/ROM to [facility] to improve ability to use RUE as assist with dressing tasks.     Time  4    Period  Weeks    Status  On-going      OT SHORT TERM GOAL #3   Title  Pt will improve RUE strength to 3+/5 to improve ability to reach for and carry lightweight items at waist height.     Time  4    Period  Weeks    Status  On-going        OT Long Term Goals - 07/23/17 2136      OT LONG TERM GOAL #1   Title  Pt will return to highest level of functioning during B/IADLs using RUE as dominant.     Time  8    Period  Weeks    Status  On-going      OT LONG TERM GOAL #2   Title  Pt will decrease pain in RUE to 2/10 or less to improve ability to sleep comfortably.     Time  8    Period  Weeks    Status  On-going      OT LONG TERM GOAL #3   Title  Pt will decrease RUE fascial restrictions to minimal amounts or less to improve mobility required for functional reaching tasks.     Time  8    Period  Weeks    Status  On-going      OT LONG TERM GOAL #4   Title  Pt will improve RUE A/ROM to [facility] to improve ability to reach up and wash/comb/fix hair.     Time  8    Period  Weeks    Status  On-going      OT LONG TERM GOAL #5   Title  Pt will improve RUE strength to 4/5 or better to improve ability to perform cooking tasks.     Time  8    Period  Weeks    Status  On-going            Plan - 07/31/17 1248    Clinical Impression Statement  A: Continued with AA/ROM this session, pt making good progress with improving ROM.  Added wall wash and therapy ball circles. Verbal cuing for form and technique with exercises, mod difficulty with prot/ret/elev/dep therefore OT providing tactile assist.     Plan  P: Follow up on HEP, continue with ball circles, add proximal shoulder strengthening       Patient will benefit from skilled therapeutic intervention in order to improve the following deficits and impairments:  Decreased activity tolerance, Decreased strength, Impaired flexibility, Decreased  range of motion, Pain, Increased fascial restrictions, Impaired UE functional use  Visit Diagnosis: Acute pain of right shoulder  Stiffness of right shoulder, not elsewhere classified  Other symptoms and signs involving the musculoskeletal system    Problem List Patient Active Problem List   Diagnosis Date Noted  . Uncontrolled type 2 diabetes mellitus with diabetic nephropathy, with long-term current use of insulin ([place])   . UTI due to extended-spectrum beta lactamase (ESBL) producing Escherichia coli   . Pneumothorax 02/05/2017  . Multiple rib fractures 02/05/2017  . Type 2 diabetes mellitus with hypoglycemia without coma ([place]) 02/05/2017  . Clavicle fracture 02/05/2017  . Elevated blood pressure reading 02/05/2017  . UTI (urinary tract infection) 02/05/2017  . Back pain at L4-L5 level 06/14/2013  . Muscle weakness (generalized) 12/17/2012  . Pain in joint, shoulder region 12/17/2012  . Shoulder fracture 12/14/2012  . Proximal humerus fracture 11/29/2012  . FRACTURE, TOE, RIGHT 09/10/2009   Guadelupe Sabin, OTR/L  [phone] 07/31/2017, 12:51 PM  [facility] [address] [place], [place], [address] Phone: [phone]   Fax:  [phone]  Name: Danielle [name] MRN: [id number] Date of Birth: [birth year]

## 2017-07-31 NOTE — Patient Instructions (Signed)
Perform each exercise ________ reps. 2-3x days.   Protraction   Start by holding a wand or cane at chest height.  Next, slowly push the wand outwards in front of your body so that your elbows become fully straightened. Then, return to the original position.     Shoulder FLEXION   In the standing position, hold a wand/cane with both arms, palms up on both sides. Raise up the wand/cane allowing your unaffected arm to perform most of the effort. Your affected arm should be partially relaxed.      Internal/External ROTATION   In the standing position, hold a wand/cane with both hands keeping your elbows bent. Move your arms and wand/cane to one side.  Your affected arm should be partially relaxed while your unaffected arm performs most of the effort.       Shoulder ABDUCTION   While holding a wand/cane palm face up on the injured side and palm face down on the uninjured side, slowly raise up your injured arm to the side.                     Horizontal Abduction/Adduction      Straight arms holding cane at shoulder height, bring cane to right, center, left. Repeat starting to left.   Copyright  VHI. All rights reserved.

## 2017-08-04 ENCOUNTER — Encounter (HOSPITAL_COMMUNITY): Payer: Self-pay

## 2017-08-04 ENCOUNTER — Other Ambulatory Visit: Payer: Self-pay

## 2017-08-04 ENCOUNTER — Ambulatory Visit (HOSPITAL_COMMUNITY): Payer: Medicare Other

## 2017-08-04 DIAGNOSIS — R29898 Other symptoms and signs involving the musculoskeletal system: Secondary | ICD-10-CM

## 2017-08-04 DIAGNOSIS — M25511 Pain in right shoulder: Secondary | ICD-10-CM | POA: Diagnosis not present

## 2017-08-04 DIAGNOSIS — M25611 Stiffness of right shoulder, not elsewhere classified: Secondary | ICD-10-CM

## 2017-08-04 NOTE — Therapy (Signed)
[place], [place], [address] Phone: [phone]   Fax:  [phone]  Occupational Therapy Treatment  Patient Details  Name: Danielle [name] MRN: [id number] Date of Birth: 02/20/1932 Referring Provider: Dr. Meredith Pel   Encounter Date: 08/04/2017  OT End of Session - 08/04/17 1055    Visit Number  5    Number of Visits  16    Date for OT Re-Evaluation  09/20/17    Authorization Type  [employer]HC Medicare    Authorization Time Period  $40 copay    OT Start Time  0950    OT Stop Time  1030    OT Time Calculation (min)  40 min    Activity Tolerance  Patient tolerated treatment well    Behavior During Therapy  [facility] for tasks assessed/performed       Past Medical History:  Diagnosis Date  . Arthritis    "some joint pain once in awhile" (05/26/2017)  . History of kidney stones   . Hypertension   . Hypothyroidism (acquired)   . Pneumothorax 02/05/2017   right-after fall  . Rib fractures 02/05/2017   right side  . Type II diabetes mellitus ([place])     Past Surgical History:  Procedure Laterality Date  . AUGMENTATION MAMMAPLASTY Bilateral   . DILATION AND CURETTAGE OF UTERUS    . FRACTURE SURGERY    . REVERSE SHOULDER ARTHROPLASTY Right 05/26/2017  . REVERSE SHOULDER ARTHROPLASTY Right 05/26/2017   Procedure: REVERSE RIGHT SHOULDER ARTHROPLASTY;  Surgeon: Meredith Pel, MD;  Location: [facility];  Service: Orthopedics;  Laterality: Right;  . TONSILLECTOMY    . TUBAL LIGATION      There were no vitals filed for this visit.  Subjective Assessment - 08/04/17 1008    Subjective   S: It feels good today.    Currently in Pain?  No/denies         [facility] OT Assessment - 08/04/17 1008      Assessment   Medical Diagnosis  s/p Right reverse TSA      Precautions   Precautions  Shoulder    Type of Shoulder Precautions  Per MD:  A/ROM ok from 3/13-4/10, 4/11 2 weeks of strengthening and then dc to HEP                OT Treatments/Exercises (OP) - 08/04/17 1008      Exercises   Exercises  Shoulder      Shoulder Exercises: Supine   Protraction  PROM;5 reps;AAROM;15 reps    Horizontal ABduction  PROM;5 reps;AAROM;15 reps    External Rotation  PROM;5 reps;AAROM;15 reps    Internal Rotation  PROM;5 reps;AAROM;15 reps    Flexion  PROM;5 reps;AAROM;15 reps    ABduction  PROM;5 reps;AAROM;15 reps      Shoulder Exercises: Standing   Protraction  AAROM;12 reps    Horizontal ABduction  AAROM;12 reps    External Rotation  AAROM;12 reps    Internal Rotation  AAROM;12 reps    Flexion  AAROM;12 reps    ABduction  AAROM;12 reps      Shoulder Exercises: Therapy Ball   Right/Left  5 reps      Shoulder Exercises: ROM/Strengthening   Proximal Shoulder Strengthening, Supine  10X no rest breaks      Manual Therapy   Manual Therapy  Myofascial release    Manual therapy comments  manual therapy interventions completed seperately from all other interventions this date  of service.      Myofascial Release  myofascial release manual stretching to right upper arm, scapular and shoulder region to decrease pain and fascial restrictions and improve pain free range of motion.  scar release to surgical scar               OT Short Term Goals - 07/23/17 2136      OT SHORT TERM GOAL #1   Title  Pt will be provided with and educated on HEP for improved mobility of RUE during ADL completion.     Time  4    Period  Weeks    Status  On-going      OT SHORT TERM GOAL #2   Title  Pt will improve RUE P/ROM to [facility] to improve ability to use RUE as assist with dressing tasks.     Time  4    Period  Weeks    Status  On-going      OT SHORT TERM GOAL #3   Title  Pt will improve RUE strength to 3+/5 to improve ability to reach for and carry lightweight items at waist height.     Time  4    Period  Weeks    Status  On-going        OT Long Term Goals - 07/23/17 2136      OT LONG TERM GOAL #1    Title  Pt will return to highest level of functioning during B/IADLs using RUE as dominant.     Time  8    Period  Weeks    Status  On-going      OT LONG TERM GOAL #2   Title  Pt will decrease pain in RUE to 2/10 or less to improve ability to sleep comfortably.     Time  8    Period  Weeks    Status  On-going      OT LONG TERM GOAL #3   Title  Pt will decrease RUE fascial restrictions to minimal amounts or less to improve mobility required for functional reaching tasks.     Time  8    Period  Weeks    Status  On-going      OT LONG TERM GOAL #4   Title  Pt will improve RUE A/ROM to [facility] to improve ability to reach up and wash/comb/fix hair.     Time  8    Period  Weeks    Status  On-going      OT LONG TERM GOAL #5   Title  Pt will improve RUE strength to 4/5 or better to improve ability to perform cooking tasks.     Time  8    Period  Weeks    Status  On-going            Plan - 08/04/17 1101    Clinical Impression Statement  A: Increased repetitions for supine and standing. patient required VC for form and technique. LImited with passive and active ROM although tolerated stretching. Pt has difficulty with slowing down movement and not using momentum to complete AA/ROM at times.    Plan  P: Continue to work on increasing P/ROM and functional reaching ability. Complete functional reaching task. Continue with PVC pipe slide.       Patient will benefit from skilled therapeutic intervention in order to improve the following deficits and impairments:  Decreased activity tolerance, Decreased strength, Impaired flexibility, Decreased range of motion, Pain, Increased fascial  restrictions, Impaired UE functional use  Visit Diagnosis: Acute pain of right shoulder  Stiffness of right shoulder, not elsewhere classified  Other symptoms and signs involving the musculoskeletal system    Problem List Patient Active Problem List   Diagnosis Date Noted  . Uncontrolled type 2  diabetes mellitus with diabetic nephropathy, with long-term current use of insulin ([place])   . UTI due to extended-spectrum beta lactamase (ESBL) producing Escherichia coli   . Pneumothorax 02/05/2017  . Multiple rib fractures 02/05/2017  . Type 2 diabetes mellitus with hypoglycemia without coma ([employer]) 02/05/2017  . Clavicle fracture 02/05/2017  . Elevated blood pressure reading 02/05/2017  . UTI (urinary tract infection) 02/05/2017  . Back pain at L4-L5 level 06/14/2013  . Muscle weakness (generalized) 12/17/2012  . Pain in joint, shoulder region 12/17/2012  . Shoulder fracture 12/14/2012  . Proximal humerus fracture 11/29/2012  . FRACTURE, TOE, RIGHT 09/10/2009   Ailene Ravel, OTR/L,CBIS  [phone]   08/04/2017, 11:03 AM  [facility] [address] [place], [place], [address] Phone: [phone]   Fax:  [phone]  Name: Danielle [name] MRN: [id number] Date of Birth: [birth year]

## 2017-08-07 ENCOUNTER — Ambulatory Visit (HOSPITAL_COMMUNITY): Payer: Medicare Other | Admitting: Occupational Therapy

## 2017-08-07 ENCOUNTER — Encounter (HOSPITAL_COMMUNITY): Payer: Self-pay | Admitting: Occupational Therapy

## 2017-08-07 DIAGNOSIS — M25611 Stiffness of right shoulder, not elsewhere classified: Secondary | ICD-10-CM

## 2017-08-07 DIAGNOSIS — M25511 Pain in right shoulder: Secondary | ICD-10-CM

## 2017-08-07 DIAGNOSIS — R29898 Other symptoms and signs involving the musculoskeletal system: Secondary | ICD-10-CM

## 2017-08-07 NOTE — Therapy (Signed)
[place], [place], [address] Phone: [phone]   Fax:  [phone]  Occupational Therapy Treatment  Patient Details  Name: Danielle [name] MRN: [id number] Date of Birth: [birth year] Referring Provider: Dr. Meredith Pel   Encounter Date: 08/07/2017  OT End of Session - 08/07/17 1155    Visit Number  6    Number of Visits  16    Date for OT Re-Evaluation  09/20/17    Authorization Type  UHC Medicare    Authorization Time Period  $40 copay    OT Start Time  1111    OT Stop Time  1152    OT Time Calculation (min)  41 min    Activity Tolerance  Patient tolerated treatment well    Behavior During Therapy  [facility] for tasks assessed/performed       Past Medical History:  Diagnosis Date  . Arthritis    "some joint pain once in awhile" (05/26/2017)  . History of kidney stones   . Hypertension   . Hypothyroidism (acquired)   . Pneumothorax 02/05/2017   right-after fall  . Rib fractures 02/05/2017   right side  . Type II diabetes mellitus ([place])     Past Surgical History:  Procedure Laterality Date  . AUGMENTATION MAMMAPLASTY Bilateral   . DILATION AND CURETTAGE OF UTERUS    . FRACTURE SURGERY    . REVERSE SHOULDER ARTHROPLASTY Right 05/26/2017  . REVERSE SHOULDER ARTHROPLASTY Right 05/26/2017   Procedure: REVERSE RIGHT SHOULDER ARTHROPLASTY;  Surgeon: Meredith Pel, MD;  Location: [place];  Service: Orthopedics;  Laterality: Right;  . TONSILLECTOMY    . TUBAL LIGATION      There were no vitals filed for this visit.  Subjective Assessment - 08/07/17 1111    Subjective   S: It wasn't as sore last time as it has been before.     Currently in Pain?  No/denies         [facility] OT Assessment - 08/07/17 1111      Assessment   Medical Diagnosis  s/p Right reverse TSA      Precautions   Precautions  Shoulder    Type of Shoulder Precautions  Per MD:  A/ROM ok from 3/13-4/10, 4/11 2 weeks of  strengthening and then dc to HEP               OT Treatments/Exercises (OP) - 08/07/17 1112      Exercises   Exercises  Shoulder      Shoulder Exercises: Supine   Protraction  PROM;5 reps;AAROM;15 reps    Horizontal ABduction  PROM;5 reps;AAROM;15 reps    External Rotation  PROM;5 reps;AAROM;15 reps    Internal Rotation  PROM;5 reps;AAROM;15 reps    Flexion  PROM;5 reps;AAROM;15 reps    ABduction  PROM;5 reps;AAROM;15 reps      Shoulder Exercises: Standing   Protraction  AAROM;12 reps    Horizontal ABduction  AAROM;12 reps    External Rotation  AAROM;12 reps    Internal Rotation  AAROM;12 reps    Flexion  AAROM;12 reps    ABduction  AAROM;12 reps      Shoulder Exercises: Pulleys   Flexion  1 minute      Shoulder Exercises: ROM/Strengthening   Proximal Shoulder Strengthening, Supine  10X no rest breaks    Other ROM/Strengthening Exercises  pvc pile flexion with trunk rotation in standing 12X       Functional Reaching Activities  Mid Level  Pt placed and removed 16 clothespins from pinch tree with min/mod difficulty      Manual Therapy   Manual Therapy  Myofascial release    Manual therapy comments  manual therapy interventions completed seperately from all other interventions this date of service.      Myofascial Release  myofascial release manual stretching to right upper arm, scapular and shoulder region to decrease pain and fascial restrictions and improve pain free range of motion.  scar release to surgical scar               OT Short Term Goals - 07/23/17 2136      OT SHORT TERM GOAL #1   Title  Pt will be provided with and educated on HEP for improved mobility of RUE during ADL completion.     Time  4    Period  Weeks    Status  On-going      OT SHORT TERM GOAL #2   Title  Pt will improve RUE P/ROM to [facility] to improve ability to use RUE as assist with dressing tasks.     Time  4    Period  Weeks    Status  On-going      OT SHORT TERM GOAL #3    Title  Pt will improve RUE strength to 3+/5 to improve ability to reach for and carry lightweight items at waist height.     Time  4    Period  Weeks    Status  On-going        OT Long Term Goals - 07/23/17 2136      OT LONG TERM GOAL #1   Title  Pt will return to highest level of functioning during B/IADLs using RUE as dominant.     Time  8    Period  Weeks    Status  On-going      OT LONG TERM GOAL #2   Title  Pt will decrease pain in RUE to 2/10 or less to improve ability to sleep comfortably.     Time  8    Period  Weeks    Status  On-going      OT LONG TERM GOAL #3   Title  Pt will decrease RUE fascial restrictions to minimal amounts or less to improve mobility required for functional reaching tasks.     Time  8    Period  Weeks    Status  On-going      OT LONG TERM GOAL #4   Title  Pt will improve RUE A/ROM to [facility] to improve ability to reach up and wash/comb/fix hair.     Time  8    Period  Weeks    Status  On-going      OT LONG TERM GOAL #5   Title  Pt will improve RUE strength to 4/5 or better to improve ability to perform cooking tasks.     Time  8    Period  Weeks    Status  On-going            Plan - 08/07/17 1155    Clinical Impression Statement  A: Continued with AA/ROM cuing pt to slow down and attain maximal stretch versus using momentum to complete exercises. Resumed PVC pipe slide and add functional reaching task, cuing for form and technique.     Plan  P: Continue working to increase P/ROM, add proximal shoulder strengthening in sitting if pt able  to complete, work on functional reaching with cones       Patient will benefit from skilled therapeutic intervention in order to improve the following deficits and impairments:  Decreased activity tolerance, Decreased strength, Impaired flexibility, Decreased range of motion, Pain, Increased fascial restrictions, Impaired UE functional use  Visit Diagnosis: Acute pain of right  shoulder  Stiffness of right shoulder, not elsewhere classified  Other symptoms and signs involving the musculoskeletal system    Problem List Patient Active Problem List   Diagnosis Date Noted  . Uncontrolled type 2 diabetes mellitus with diabetic nephropathy, with long-term current use of insulin ([place])   . UTI due to extended-spectrum beta lactamase (ESBL) producing Escherichia coli   . Pneumothorax 02/05/2017  . Multiple rib fractures 02/05/2017  . Type 2 diabetes mellitus with hypoglycemia without coma ([place]) 02/05/2017  . Clavicle fracture 02/05/2017  . Elevated blood pressure reading 02/05/2017  . UTI (urinary tract infection) 02/05/2017  . Back pain at L4-L5 level 06/14/2013  . Muscle weakness (generalized) 12/17/2012  . Pain in joint, shoulder region 12/17/2012  . Shoulder fracture 12/14/2012  . Proximal humerus fracture 11/29/2012  . FRACTURE, TOE, RIGHT 09/10/2009   Guadelupe Sabin, OTR/L  [phone] 08/07/2017, 11:57 AM  [place], [place], [address] Phone: [phone]   Fax:  [phone]  Name: Danielle [name] MRN: [id number] Date of Birth: [birth year]

## 2017-08-12 ENCOUNTER — Ambulatory Visit (HOSPITAL_COMMUNITY): Payer: Medicare Other | Admitting: Occupational Therapy

## 2017-08-12 ENCOUNTER — Encounter (HOSPITAL_COMMUNITY): Payer: Self-pay | Admitting: Occupational Therapy

## 2017-08-12 DIAGNOSIS — M25511 Pain in right shoulder: Secondary | ICD-10-CM

## 2017-08-12 DIAGNOSIS — R29898 Other symptoms and signs involving the musculoskeletal system: Secondary | ICD-10-CM

## 2017-08-12 DIAGNOSIS — M25611 Stiffness of right shoulder, not elsewhere classified: Secondary | ICD-10-CM

## 2017-08-12 NOTE — Therapy (Signed)
[place], [place], [address] Phone: [phone]   Fax:  [phone]  Occupational Therapy Treatment  Patient Details  Name: Danielle [name] MRN: [id number] Date of Birth: 1931/12/21 Referring Provider: Dr. Meredith Pel   Encounter Date: 08/12/2017  OT End of Session - 08/12/17 1028    Visit Number  7    Number of Visits  16    Date for OT Re-Evaluation  09/20/17    Authorization Type  UHC Medicare    Authorization Time Period  $40 copay    OT Start Time  0945    OT Stop Time  1026    OT Time Calculation (min)  41 min    Activity Tolerance  Patient tolerated treatment well    Behavior During Therapy  [facility] for tasks assessed/performed       Past Medical History:  Diagnosis Date  . Arthritis    "some joint pain once in awhile" (05/26/2017)  . History of kidney stones   . Hypertension   . Hypothyroidism (acquired)   . Pneumothorax 02/05/2017   right-after fall  . Rib fractures 02/05/2017   right side  . Type II diabetes mellitus ([place])     Past Surgical History:  Procedure Laterality Date  . AUGMENTATION MAMMAPLASTY Bilateral   . DILATION AND CURETTAGE OF UTERUS    . FRACTURE SURGERY    . REVERSE SHOULDER ARTHROPLASTY Right 05/26/2017  . REVERSE SHOULDER ARTHROPLASTY Right 05/26/2017   Procedure: REVERSE RIGHT SHOULDER ARTHROPLASTY;  Surgeon: Meredith Pel, MD;  Location: [place];  Service: Orthopedics;  Laterality: Right;  . TONSILLECTOMY    . TUBAL LIGATION      There were no vitals filed for this visit.  Subjective Assessment - 08/12/17 0944    Subjective   S: My arm is stiff this morning for some reason.     Currently in Pain?  No/denies         [facility] OT Assessment - 08/12/17 0944      Assessment   Medical Diagnosis  s/p Right reverse TSA      Precautions   Precautions  Shoulder    Type of Shoulder Precautions  Per MD:  A/ROM ok from 3/13-4/10, 4/11 2 weeks of strengthening  and then dc to HEP               OT Treatments/Exercises (OP) - 08/12/17 0947      Exercises   Exercises  Shoulder      Shoulder Exercises: Supine   Protraction  PROM;5 reps;AROM;12 reps    Horizontal ABduction  PROM;5 reps;AROM;12 reps    External Rotation  PROM;5 reps;AROM;12 reps    Internal Rotation  PROM;5 reps;AROM;12 reps    Flexion  PROM;5 reps;AROM;12 reps    ABduction  PROM;5 reps;AROM;12 reps      Shoulder Exercises: Standing   Protraction  AAROM;15 reps    Horizontal ABduction  AAROM;15 reps    External Rotation  AROM;12 reps    Internal Rotation  AROM;12 reps    Flexion  AAROM;15 reps    ABduction  AAROM;15 reps      Shoulder Exercises: Pulleys   Flexion  1 minute      Shoulder Exercises: ROM/Strengthening   Wall Wash  1'    Proximal Shoulder Strengthening, Supine  10X no rest breaks    Proximal Shoulder Strengthening, Seated  10X each no rest breaks      Functional Reaching  Activities   Mid Level  Pt placed and removed 10 cones from middle shelf of cabinet with mod difficulty.       Manual Therapy   Manual Therapy  Myofascial release    Manual therapy comments  manual therapy interventions completed seperately from all other interventions this date of service.      Myofascial Release  myofascial release manual stretching to right upper arm, scapular and shoulder region to decrease pain and fascial restrictions and improve pain free range of motion.  scar release to surgical scar               OT Short Term Goals - 07/23/17 2136      OT SHORT TERM GOAL #1   Title  Pt will be provided with and educated on HEP for improved mobility of RUE during ADL completion.     Time  4    Period  Weeks    Status  On-going      OT SHORT TERM GOAL #2   Title  Pt will improve RUE P/ROM to [facility] to improve ability to use RUE as assist with dressing tasks.     Time  4    Period  Weeks    Status  On-going      OT SHORT TERM GOAL #3   Title  Pt will  improve RUE strength to 3+/5 to improve ability to reach for and carry lightweight items at waist height.     Time  4    Period  Weeks    Status  On-going        OT Long Term Goals - 07/23/17 2136      OT LONG TERM GOAL #1   Title  Pt will return to highest level of functioning during B/IADLs using RUE as dominant.     Time  8    Period  Weeks    Status  On-going      OT LONG TERM GOAL #2   Title  Pt will decrease pain in RUE to 2/10 or less to improve ability to sleep comfortably.     Time  8    Period  Weeks    Status  On-going      OT LONG TERM GOAL #3   Title  Pt will decrease RUE fascial restrictions to minimal amounts or less to improve mobility required for functional reaching tasks.     Time  8    Period  Weeks    Status  On-going      OT LONG TERM GOAL #4   Title  Pt will improve RUE A/ROM to [facility] to improve ability to reach up and wash/comb/fix hair.     Time  8    Period  Weeks    Status  On-going      OT LONG TERM GOAL #5   Title  Pt will improve RUE strength to 4/5 or better to improve ability to perform cooking tasks.     Time  8    Period  Weeks    Status  On-going            Plan - 08/12/17 1028    Clinical Impression Statement  A: Progressed to A/ROM in supine, continued with AA/ROM in sitting for pt to achieve maximum stretch. Pt with improved form today, slowing down her movement versus using momentum. Continues to require verbal cuing for form and technique. Added proximal shoulder strengthening in sitting, pt able to complete  without difficulty, cuing for form.     Plan  P: Attempt A/ROM in sitting, add to HEP if pt completing with good form. Add scapular theraband row and extension       Patient will benefit from skilled therapeutic intervention in order to improve the following deficits and impairments:  Decreased activity tolerance, Decreased strength, Impaired flexibility, Decreased range of motion, Pain, Increased fascial restrictions,  Impaired UE functional use  Visit Diagnosis: Acute pain of right shoulder  Stiffness of right shoulder, not elsewhere classified  Other symptoms and signs involving the musculoskeletal system    Problem List Patient Active Problem List   Diagnosis Date Noted  . Uncontrolled type 2 diabetes mellitus with diabetic nephropathy, with long-term current use of insulin ([place])   . UTI due to extended-spectrum beta lactamase (ESBL) producing Escherichia coli   . Pneumothorax 02/05/2017  . Multiple rib fractures 02/05/2017  . Type 2 diabetes mellitus with hypoglycemia without coma ([place]) 02/05/2017  . Clavicle fracture 02/05/2017  . Elevated blood pressure reading 02/05/2017  . UTI (urinary tract infection) 02/05/2017  . Back pain at L4-L5 level 06/14/2013  . Muscle weakness (generalized) 12/17/2012  . Pain in joint, shoulder region 12/17/2012  . Shoulder fracture 12/14/2012  . Proximal humerus fracture 11/29/2012  . FRACTURE, TOE, RIGHT 09/10/2009   Guadelupe Sabin, OTR/L  [phone] 08/12/2017, 10:31 AM  [facility] [address] [place], [place], [address] Phone: [phone]   Fax:  [phone]  Name: Danielle [name] MRN: [id number] Date of Birth: 1932-01-26

## 2017-08-14 ENCOUNTER — Ambulatory Visit (HOSPITAL_COMMUNITY): Payer: Medicare Other

## 2017-08-14 ENCOUNTER — Encounter (HOSPITAL_COMMUNITY): Payer: Self-pay

## 2017-08-14 ENCOUNTER — Other Ambulatory Visit: Payer: Self-pay

## 2017-08-14 DIAGNOSIS — M25611 Stiffness of right shoulder, not elsewhere classified: Secondary | ICD-10-CM

## 2017-08-14 DIAGNOSIS — R29898 Other symptoms and signs involving the musculoskeletal system: Secondary | ICD-10-CM

## 2017-08-14 DIAGNOSIS — M25511 Pain in right shoulder: Secondary | ICD-10-CM | POA: Diagnosis not present

## 2017-08-14 NOTE — Therapy (Signed)
[place], [place], [address] Phone: [phone]   Fax:  [phone]  Occupational Therapy Treatment  Patient Details  Name: Danielle [name] MRN: [id number] Date of Birth: [birth year] Referring Provider: Dr. Meredith Pel   Encounter Date: 08/14/2017  OT End of Session - 08/14/17 1023    Visit Number  8    Number of Visits  16    Date for OT Re-Evaluation  09/20/17    Authorization Type  UHC Medicare    Authorization Time Period  $40 copay    OT Start Time  [phone]    OT Stop Time  1030    OT Time Calculation (min)  37 min    Activity Tolerance  Patient tolerated treatment well    Behavior During Therapy  [facility] for tasks assessed/performed       Past Medical History:  Diagnosis Date  . Arthritis    "some joint pain once in awhile" (05/26/2017)  . History of kidney stones   . Hypertension   . Hypothyroidism (acquired)   . Pneumothorax 02/05/2017   right-after fall  . Rib fractures 02/05/2017   right side  . Type II diabetes mellitus ([place])     Past Surgical History:  Procedure Laterality Date  . AUGMENTATION MAMMAPLASTY Bilateral   . DILATION AND CURETTAGE OF UTERUS    . FRACTURE SURGERY    . REVERSE SHOULDER ARTHROPLASTY Right 05/26/2017  . REVERSE SHOULDER ARTHROPLASTY Right 05/26/2017   Procedure: REVERSE RIGHT SHOULDER ARTHROPLASTY;  Surgeon: Meredith Pel, MD;  Location: [place];  Service: Orthopedics;  Laterality: Right;  . TONSILLECTOMY    . TUBAL LIGATION      There were no vitals filed for this visit.  Subjective Assessment - 08/14/17 1010    Subjective   S: It's just stiff this morning.     Currently in Pain?  No/denies         [facility] OT Assessment - 08/14/17 1010      Assessment   Medical Diagnosis  s/p Right reverse TSA      Precautions   Precautions  Shoulder    Type of Shoulder Precautions  Per MD:  A/ROM ok from 3/13-4/10, 4/11 2 weeks of strengthening and then dc to  HEP               OT Treatments/Exercises (OP) - 08/14/17 1010      Exercises   Exercises  Shoulder      Shoulder Exercises: Supine   Protraction  PROM;5 reps;AROM;12 reps    Horizontal ABduction  PROM;5 reps;AROM;12 reps      Shoulder Exercises: Standing   Protraction  AROM;12 reps    Horizontal ABduction  AAROM;15 reps    External Rotation  AROM;12 reps    Internal Rotation  AROM;12 reps    Flexion  AAROM;15 reps    ABduction  AAROM;15 reps    Extension  Theraband;10 reps    Theraband Level (Shoulder Extension)  Level 2 (Red)    Row  Theraband;10 reps    Theraband Level (Shoulder Row)  Level 2 (Red)      Shoulder Exercises: Therapy Ball   Right/Left  5 reps      Shoulder Exercises: ROM/Strengthening   X to V Arms  5X    Proximal Shoulder Strengthening, Supine  12X no rest breaks    Proximal Shoulder Strengthening, Seated  12X each no rest breaks      Manual  Therapy   Manual Therapy  Myofascial release    Manual therapy comments  manual therapy interventions completed seperately from all other interventions this date of service.      Myofascial Release  myofascial release manual stretching to right upper arm, scapular and shoulder region to decrease pain and fascial restrictions and improve pain free range of motion.  scar release to surgical scar               OT Short Term Goals - 07/23/17 2136      OT SHORT TERM GOAL #1   Title  Pt will be provided with and educated on HEP for improved mobility of RUE during ADL completion.     Time  4    Period  Weeks    Status  On-going      OT SHORT TERM GOAL #2   Title  Pt will improve RUE P/ROM to [facility] to improve ability to use RUE as assist with dressing tasks.     Time  4    Period  Weeks    Status  On-going      OT SHORT TERM GOAL #3   Title  Pt will improve RUE strength to 3+/5 to improve ability to reach for and carry lightweight items at waist height.     Time  4    Period  Weeks    Status   On-going        OT Long Term Goals - 07/23/17 2136      OT LONG TERM GOAL #1   Title  Pt will return to highest level of functioning during B/IADLs using RUE as dominant.     Time  8    Period  Weeks    Status  On-going      OT LONG TERM GOAL #2   Title  Pt will decrease pain in RUE to 2/10 or less to improve ability to sleep comfortably.     Time  8    Period  Weeks    Status  On-going      OT LONG TERM GOAL #3   Title  Pt will decrease RUE fascial restrictions to minimal amounts or less to improve mobility required for functional reaching tasks.     Time  8    Period  Weeks    Status  On-going      OT LONG TERM GOAL #4   Title  Pt will improve RUE A/ROM to [facility] to improve ability to reach up and wash/comb/fix hair.     Time  8    Period  Weeks    Status  On-going      OT LONG TERM GOAL #5   Title  Pt will improve RUE strength to 4/5 or better to improve ability to perform cooking tasks.     Time  8    Period  Weeks    Status  On-going            Plan - 08/14/17 1023    Clinical Impression Statement  A: Unable to complete all A/ROM standing. Pt required VC to slow down movements and once she did her form greatly improved. Added scapular theraband exercises and attempt X to V arms. Unable to raise arms into a full V.     Plan  P: Continue to work on progressing to A/ROM for all standing exercises. May want to try PVC pipe to increase ROM for shoulder flexion. Add overhead lacing.  Consulted and Agree with Plan of Care  Patient       Patient will benefit from skilled therapeutic intervention in order to improve the following deficits and impairments:  Decreased activity tolerance, Decreased strength, Impaired flexibility, Decreased range of motion, Pain, Increased fascial restrictions, Impaired UE functional use  Visit Diagnosis: Acute pain of right shoulder  Stiffness of right shoulder, not elsewhere classified  Other symptoms and signs involving the  musculoskeletal system    Problem List Patient Active Problem List   Diagnosis Date Noted  . Uncontrolled type 2 diabetes mellitus with diabetic nephropathy, with long-term current use of insulin ([place])   . UTI due to extended-spectrum beta lactamase (ESBL) producing Escherichia coli   . Pneumothorax 02/05/2017  . Multiple rib fractures 02/05/2017  . Type 2 diabetes mellitus with hypoglycemia without coma ([place]) 02/05/2017  . Clavicle fracture 02/05/2017  . Elevated blood pressure reading 02/05/2017  . UTI (urinary tract infection) 02/05/2017  . Back pain at L4-L5 level 06/14/2013  . Muscle weakness (generalized) 12/17/2012  . Pain in joint, shoulder region 12/17/2012  . Shoulder fracture 12/14/2012  . Proximal humerus fracture 11/29/2012  . FRACTURE, TOE, RIGHT 09/10/2009   Ailene Ravel, OTR/L,CBIS  [phone]  08/14/2017, 10:57 AM  [place] [address] [place], [place], [address] Phone: [phone]   Fax:  [phone]  Name: BERNETTA SUTLEY MRN: [id number] Date of Birth: 04-Jan-1932

## 2017-08-19 ENCOUNTER — Encounter (HOSPITAL_COMMUNITY): Payer: Self-pay | Admitting: Occupational Therapy

## 2017-08-19 ENCOUNTER — Ambulatory Visit (HOSPITAL_COMMUNITY): Payer: Medicare Other | Attending: Orthopedic Surgery | Admitting: Occupational Therapy

## 2017-08-19 DIAGNOSIS — M25511 Pain in right shoulder: Secondary | ICD-10-CM | POA: Insufficient documentation

## 2017-08-19 DIAGNOSIS — R29898 Other symptoms and signs involving the musculoskeletal system: Secondary | ICD-10-CM | POA: Insufficient documentation

## 2017-08-19 DIAGNOSIS — M25611 Stiffness of right shoulder, not elsewhere classified: Secondary | ICD-10-CM | POA: Insufficient documentation

## 2017-08-19 NOTE — Therapy (Signed)
[place], [place], [address] Phone: [phone]   Fax:  [phone]  Occupational Therapy Treatment  Patient Details  Name: Danielle [name] MRN: [id number] Date of Birth: 1931/11/18 Referring Provider: Dr. Meredith Pel   Encounter Date: 08/19/2017  OT End of Session - 08/19/17 1159    Visit Number  9    Number of Visits  16    Date for OT Re-Evaluation  09/20/17    Authorization Type  [employer] Medicare    Authorization Time Period  $40 copay    OT Start Time  1035    OT Stop Time  1115    OT Time Calculation (min)  40 min    Activity Tolerance  Patient tolerated treatment well    Behavior During Therapy  [facility] for tasks assessed/performed       Past Medical History:  Diagnosis Date  . Arthritis    "some joint pain once in awhile" (05/26/2017)  . History of kidney stones   . Hypertension   . Hypothyroidism (acquired)   . Pneumothorax 02/05/2017   right-after fall  . Rib fractures 02/05/2017   right side  . Type II diabetes mellitus ([place])     Past Surgical History:  Procedure Laterality Date  . AUGMENTATION MAMMAPLASTY Bilateral   . DILATION AND CURETTAGE OF UTERUS    . FRACTURE SURGERY    . REVERSE SHOULDER ARTHROPLASTY Right 05/26/2017  . REVERSE SHOULDER ARTHROPLASTY Right 05/26/2017   Procedure: REVERSE RIGHT SHOULDER ARTHROPLASTY;  Surgeon: Meredith Pel, MD;  Location: [facility];  Service: Orthopedics;  Laterality: Right;  . TONSILLECTOMY    . TUBAL LIGATION      There were no vitals filed for this visit.  Subjective Assessment - 08/19/17 1033    Subjective   S: It feels sort of tight but it doesn't hurt.     Currently in Pain?  No/denies         [facility] OT Assessment - 08/19/17 1033      Assessment   Medical Diagnosis  s/p Right reverse TSA      Precautions   Precautions  Shoulder    Type of Shoulder Precautions  Per MD:  A/ROM ok from 3/13-4/10, 4/11 2 weeks of strengthening  and then dc to HEP               OT Treatments/Exercises (OP) - 08/19/17 1036      Exercises   Exercises  Shoulder      Shoulder Exercises: Supine   Protraction  PROM;5 reps;AROM;12 reps    Horizontal ABduction  PROM;5 reps;AROM;12 reps    External Rotation  PROM;5 reps;AROM;12 reps    Internal Rotation  PROM;5 reps;AROM;12 reps    Flexion  PROM;5 reps;AROM;12 reps    ABduction  PROM;5 reps;AROM;12 reps      Shoulder Exercises: Standing   Protraction  AROM;12 reps    Horizontal ABduction  AROM;12 reps    External Rotation  AROM;12 reps    Internal Rotation  AROM;12 reps    Flexion  AROM;12 reps    Flexion Limitations  50% range    ABduction  AROM;12 reps    ABduction Limitations  50% range    Extension  Theraband;10 reps    Theraband Level (Shoulder Extension)  Level 2 (Red)    Row  Theraband;10 reps    Theraband Level (Shoulder Row)  Level 2 (Red)      Shoulder Exercises: Therapy  Ball   Right/Left  5 reps each direction      Shoulder Exercises: ROM/Strengthening   Over Head Lace  1'    X to V Arms  10X    Proximal Shoulder Strengthening, Supine  12X no rest breaks    Proximal Shoulder Strengthening, Seated  12X each no rest breaks    Other ROM/Strengthening Exercises  pvc pile flexion with trunk rotation in standing 12X       Functional Reaching Activities   Mid Level  Pt placed and removed 10 cones from middle shelf of cabinet with min/mod difficulty.       Manual Therapy   Manual Therapy  Myofascial release    Manual therapy comments  manual therapy interventions completed seperately from all other interventions this date of service.      Myofascial Release  myofascial release manual stretching to right upper arm, scapular and shoulder region to decrease pain and fascial restrictions and improve pain free range of motion.  scar release to surgical scar               OT Short Term Goals - 07/23/17 2136      OT SHORT TERM GOAL #1   Title  Pt will  be provided with and educated on HEP for improved mobility of RUE during ADL completion.     Time  4    Period  Weeks    Status  On-going      OT SHORT TERM GOAL #2   Title  Pt will improve RUE P/ROM to [facility] to improve ability to use RUE as assist with dressing tasks.     Time  4    Period  Weeks    Status  On-going      OT SHORT TERM GOAL #3   Title  Pt will improve RUE strength to 3+/5 to improve ability to reach for and carry lightweight items at waist height.     Time  4    Period  Weeks    Status  On-going        OT Long Term Goals - 07/23/17 2136      OT LONG TERM GOAL #1   Title  Pt will return to highest level of functioning during B/IADLs using RUE as dominant.     Time  8    Period  Weeks    Status  On-going      OT LONG TERM GOAL #2   Title  Pt will decrease pain in RUE to 2/10 or less to improve ability to sleep comfortably.     Time  8    Period  Weeks    Status  On-going      OT LONG TERM GOAL #3   Title  Pt will decrease RUE fascial restrictions to minimal amounts or less to improve mobility required for functional reaching tasks.     Time  8    Period  Weeks    Status  On-going      OT LONG TERM GOAL #4   Title  Pt will improve RUE A/ROM to [facility] to improve ability to reach up and wash/comb/fix hair.     Time  8    Period  Weeks    Status  On-going      OT LONG TERM GOAL #5   Title  Pt will improve RUE strength to 4/5 or better to improve ability to perform cooking tasks.     Time  8  Period  Weeks    Status  On-going            Plan - 08/19/17 1159    Clinical Impression Statement  A: Pt able to complete all A/ROM in standing, verbal cuing for speed and to strive for end range stretch. Continued with scapular theraband and resumed PVC pipe slide, added overhead lacing working on ROM and activity tolerance. Verbal cuing for form during all exercises.     Plan  P: Continue working on form and achieving highest range for all exercises, resume  UBE       Patient will benefit from skilled therapeutic intervention in order to improve the following deficits and impairments:  Decreased activity tolerance, Decreased strength, Impaired flexibility, Decreased range of motion, Pain, Increased fascial restrictions, Impaired UE functional use  Visit Diagnosis: Acute pain of right shoulder  Stiffness of right shoulder, not elsewhere classified  Other symptoms and signs involving the musculoskeletal system    Problem List Patient Active Problem List   Diagnosis Date Noted  . Uncontrolled type 2 diabetes mellitus with diabetic nephropathy, with long-term current use of insulin ([place])   . UTI due to extended-spectrum beta lactamase (ESBL) producing Escherichia coli   . Pneumothorax 02/05/2017  . Multiple rib fractures 02/05/2017  . Type 2 diabetes mellitus with hypoglycemia without coma ([place]) 02/05/2017  . Clavicle fracture 02/05/2017  . Elevated blood pressure reading 02/05/2017  . UTI (urinary tract infection) 02/05/2017  . Back pain at L4-L5 level 06/14/2013  . Muscle weakness (generalized) 12/17/2012  . Pain in joint, shoulder region 12/17/2012  . Shoulder fracture 12/14/2012  . Proximal humerus fracture 11/29/2012  . FRACTURE, TOE, RIGHT 09/10/2009   Guadelupe Sabin, OTR/L  [phone] 08/19/2017, 12:03 PM  [facility] [address] [place], [place], [address] Phone: [phone]   Fax:  [phone]  Name: Danielle [name] MRN: [id number] Date of Birth: May 25, 1931

## 2017-08-21 ENCOUNTER — Ambulatory Visit (HOSPITAL_COMMUNITY): Payer: Medicare Other | Admitting: Occupational Therapy

## 2017-08-21 ENCOUNTER — Encounter (HOSPITAL_COMMUNITY): Payer: Self-pay | Admitting: Occupational Therapy

## 2017-08-21 DIAGNOSIS — M25611 Stiffness of right shoulder, not elsewhere classified: Secondary | ICD-10-CM

## 2017-08-21 DIAGNOSIS — M25511 Pain in right shoulder: Secondary | ICD-10-CM

## 2017-08-21 DIAGNOSIS — R29898 Other symptoms and signs involving the musculoskeletal system: Secondary | ICD-10-CM

## 2017-08-21 NOTE — Therapy (Signed)
[facility] [place], [place], [address] Phone: [phone]   Fax:  [phone]  Occupational Therapy Treatment  Patient Details  Name: Danielle [name] MRN: [id number] Date of Birth: 03-07-1932 Referring Provider: Dr. Meredith Pel   Encounter Date: 08/21/2017  OT End of Session - 08/21/17 1005    Visit Number  10    Number of Visits  16    Date for OT Re-Evaluation  09/20/17    Authorization Type  [employer]HC Medicare    Authorization Time Period  $40 copay    OT Start Time  [phone]    OT Stop Time  1030    OT Time Calculation (min)  48 min    Activity Tolerance  Patient tolerated treatment well    Behavior During Therapy  [facility] for tasks assessed/performed       Past Medical History:  Diagnosis Date  . Arthritis    "some joint pain once in awhile" (05/26/2017)  . History of kidney stones   . Hypertension   . Hypothyroidism (acquired)   . Pneumothorax 02/05/2017   right-after fall  . Rib fractures 02/05/2017   right side  . Type II diabetes mellitus ([place])     Past Surgical History:  Procedure Laterality Date  . AUGMENTATION MAMMAPLASTY Bilateral   . DILATION AND CURETTAGE OF UTERUS    . FRACTURE SURGERY    . REVERSE SHOULDER ARTHROPLASTY Right 05/26/2017  . REVERSE SHOULDER ARTHROPLASTY Right 05/26/2017   Procedure: REVERSE RIGHT SHOULDER ARTHROPLASTY;  Surgeon: Meredith Pel, MD;  Location: [facility];  Service: Orthopedics;  Laterality: Right;  . TONSILLECTOMY    . TUBAL LIGATION      There were no vitals filed for this visit.  Subjective Assessment - 08/21/17 0944    Subjective   S: It's a little bit sore but no painful.     Currently in Pain?  No/denies         [facility] OT Assessment - 08/21/17 0944      Assessment   Medical Diagnosis  s/p Right reverse TSA      Precautions   Precautions  Shoulder    Type of Shoulder Precautions  Per MD:  A/ROM ok from 3/13-4/10, 4/11 2 weeks of strengthening and  then dc to HEP      ROM / Strength   AROM / PROM / Strength  Strength      Palpation   Palpation comment  Moderate facial restictions along upper arm and trapezius regions      AROM   Overall AROM Comments  Assessed seated, er/IR adducted    AROM Assessment Site  Shoulder    Right/Left Shoulder  Right    Right Shoulder Flexion  104 Degrees not previously assessed    Right Shoulder ABduction  95 Degrees not previously assessed    Right Shoulder Internal Rotation  90 Degrees not previously assessed    Right Shoulder External Rotation  49 Degrees not previously assessed      PROM   Overall PROM Comments  Assessed supine, er/IR adducted    PROM Assessment Site  Shoulder    Right/Left Shoulder  Right    Right Shoulder Flexion  136 Degrees 130 previous    Right Shoulder ABduction  148 Degrees 130 previous    Right Shoulder Internal Rotation  90 Degrees same as previous    Right Shoulder External Rotation  76 Degrees 70 previous      Strength  Overall Strength Comments  Assessed seated, er/IR adducted    Strength Assessment Site  Shoulder    Right/Left Shoulder  Right    Right Shoulder Flexion  4-/5 not previously assessed    Right Shoulder ABduction  3+/5 not previously assessed    Right Shoulder Internal Rotation  4/5 not previously assessed    Right Shoulder External Rotation  3/5 not previously assessed               OT Treatments/Exercises (OP) - 08/21/17 0944      Exercises   Exercises  Shoulder      Shoulder Exercises: Supine   Protraction  PROM;5 reps;AROM;15 reps    Horizontal ABduction  PROM;5 reps;AROM;15 reps    External Rotation  PROM;5 reps;AROM;15 reps    Internal Rotation  PROM;5 reps;AROM;15 reps    Flexion  PROM;5 reps;AROM;15 reps    ABduction  PROM;5 reps;AROM;15 reps      Shoulder Exercises: Standing   Protraction  AROM;12 reps    Horizontal ABduction  AROM;12 reps    External Rotation  AROM;12 reps    Internal Rotation  AROM;12 reps     Flexion  AROM;12 reps    Flexion Limitations  50% range    ABduction  AROM;12 reps    ABduction Limitations  50% range    Extension  Theraband;12 reps    Theraband Level (Shoulder Extension)  Level 2 (Red)    Row  Theraband;12 reps    Theraband Level (Shoulder Row)  Level 2 (Red)    Retraction  Theraband;12 reps    Theraband Level (Shoulder Retraction)  Level 2 (Red)      Shoulder Exercises: ROM/Strengthening   UBE (Upper Arm Bike)  Level 1 2' forward 2' reverse    Over Head Lace  1'    X to V Arms  10X    Proximal Shoulder Strengthening, Seated  12X each no rest breaks      Manual Therapy   Manual Therapy  Myofascial release    Manual therapy comments  manual therapy interventions completed seperately from all other interventions this date of service.      Myofascial Release  myofascial release manual stretching to right upper arm, scapular and shoulder region to decrease pain and fascial restrictions and improve pain free range of motion.  scar release to surgical scar               OT Short Term Goals - 08/21/17 1133      OT SHORT TERM GOAL #1   Title  Pt will be provided with and educated on HEP for improved mobility of RUE during ADL completion.     Time  4    Period  Weeks    Status  On-going      OT SHORT TERM GOAL #2   Title  Pt will improve RUE P/ROM to [facility] to improve ability to use RUE as assist with dressing tasks.     Time  4    Period  Weeks    Status  Partially Met      OT SHORT TERM GOAL #3   Title  Pt will improve RUE strength to 3+/5 to improve ability to reach for and carry lightweight items at waist height.     Time  4    Period  Weeks    Status  Achieved        OT Long Term Goals - 07/23/17 2136  OT LONG TERM GOAL #1   Title  Pt will return to highest level of functioning during B/IADLs using RUE as dominant.     Time  8    Period  Weeks    Status  On-going      OT LONG TERM GOAL #2   Title  Pt will decrease pain in RUE to 2/10  or less to improve ability to sleep comfortably.     Time  8    Period  Weeks    Status  On-going      OT LONG TERM GOAL #3   Title  Pt will decrease RUE fascial restrictions to minimal amounts or less to improve mobility required for functional reaching tasks.     Time  8    Period  Weeks    Status  On-going      OT LONG TERM GOAL #4   Title  Pt will improve RUE A/ROM to [facility] to improve ability to reach up and wash/comb/fix hair.     Time  8    Period  Weeks    Status  On-going      OT LONG TERM GOAL #5   Title  Pt will improve RUE strength to 4/5 or better to improve ability to perform cooking tasks.     Time  8    Period  Weeks    Status  On-going            Plan - 08/21/17 1133    Clinical Impression Statement  A: Mini-reassessment completed this session, pt has met 1 STG and partially met an additional STG, working towards remaining goals. Pt reports she is now able to comb her hair in the back, continues to have difficulty with functional reaching above head height. Continued with A/ROM working on form and cuing for slowing down to reach end range stretch. Resumed UBE and pulleys, pt with min fatigue at end of session, verbal cuing throughout session for form and technique.     Plan  P: Add shoulder stretches, add scapular theraband to HEP, continue working on functional reaching tasks       Patient will benefit from skilled therapeutic intervention in order to improve the following deficits and impairments:  Decreased activity tolerance, Decreased strength, Impaired flexibility, Decreased range of motion, Pain, Increased fascial restrictions, Impaired UE functional use  Visit Diagnosis: Acute pain of right shoulder  Stiffness of right shoulder, not elsewhere classified  Other symptoms and signs involving the musculoskeletal system    Problem List Patient Active Problem List   Diagnosis Date Noted  . Uncontrolled type 2 diabetes mellitus with diabetic  nephropathy, with long-term current use of insulin ([place])   . UTI due to extended-spectrum beta lactamase (ESBL) producing Escherichia coli   . Pneumothorax 02/05/2017  . Multiple rib fractures 02/05/2017  . Type 2 diabetes mellitus with hypoglycemia without coma ([place]) 02/05/2017  . Clavicle fracture 02/05/2017  . Elevated blood pressure reading 02/05/2017  . UTI (urinary tract infection) 02/05/2017  . Back pain at L4-L5 level 06/14/2013  . Muscle weakness (generalized) 12/17/2012  . Pain in joint, shoulder region 12/17/2012  . Shoulder fracture 12/14/2012  . Proximal humerus fracture 11/29/2012  . FRACTURE, TOE, RIGHT 09/10/2009   Guadelupe Sabin, OTR/L  [phone] 08/21/2017, 11:36 AM  [facility] [address] [place], [place], [address] Phone: [phone]   Fax:  [phone]  Name: Danielle [name] MRN: [id number] Date of Birth: August 28, 1931

## 2017-08-26 ENCOUNTER — Ambulatory Visit (HOSPITAL_COMMUNITY): Payer: Medicare Other

## 2017-08-26 ENCOUNTER — Other Ambulatory Visit: Payer: Self-pay

## 2017-08-26 ENCOUNTER — Encounter (HOSPITAL_COMMUNITY): Payer: Self-pay

## 2017-08-26 DIAGNOSIS — M25511 Pain in right shoulder: Secondary | ICD-10-CM | POA: Diagnosis not present

## 2017-08-26 DIAGNOSIS — M25611 Stiffness of right shoulder, not elsewhere classified: Secondary | ICD-10-CM

## 2017-08-26 DIAGNOSIS — R29898 Other symptoms and signs involving the musculoskeletal system: Secondary | ICD-10-CM

## 2017-08-26 NOTE — Therapy (Signed)
[place], [place], [address] Phone: [phone]   Fax:  [phone]  Occupational Therapy Treatment  Patient Details  Name: Danielle [name] MRN: [id number] Date of Birth: 11-Feb-1932 Referring Provider: Dr. Meredith Pel   Encounter Date: 08/26/2017  OT End of Session - 08/26/17 0918    Visit Number  11    Number of Visits  16    Date for OT Re-Evaluation  09/20/17    Authorization Type  [employer] Medicare    Authorization Time Period  $40 copay    OT Start Time  0900    OT Stop Time  0945    OT Time Calculation (min)  45 min    Activity Tolerance  Patient tolerated treatment well    Behavior During Therapy  [facility] for tasks assessed/performed       Past Medical History:  Diagnosis Date  . Arthritis    "some joint pain once in awhile" (05/26/2017)  . History of kidney stones   . Hypertension   . Hypothyroidism (acquired)   . Pneumothorax 02/05/2017   right-after fall  . Rib fractures 02/05/2017   right side  . Type II diabetes mellitus ([place])     Past Surgical History:  Procedure Laterality Date  . AUGMENTATION MAMMAPLASTY Bilateral   . DILATION AND CURETTAGE OF UTERUS    . FRACTURE SURGERY    . REVERSE SHOULDER ARTHROPLASTY Right 05/26/2017  . REVERSE SHOULDER ARTHROPLASTY Right 05/26/2017   Procedure: REVERSE RIGHT SHOULDER ARTHROPLASTY;  Surgeon: Meredith Pel, MD;  Location: [place];  Service: Orthopedics;  Laterality: Right;  . TONSILLECTOMY    . TUBAL LIGATION      There were no vitals filed for this visit.  Subjective Assessment - 08/26/17 0916    Subjective   S: I feel a little bit of soreness in the bicep. No pain though.     Currently in Pain?  No/denies         [facility] OT Assessment - 08/26/17 0916      Assessment   Medical Diagnosis  s/p Right reverse TSA      Precautions   Precautions  Shoulder    Type of Shoulder Precautions  Per MD:  A/ROM ok from 3/13-4/10, 4/11 2 weeks  of strengthening and then dc to HEP               OT Treatments/Exercises (OP) - 08/26/17 0916      Exercises   Exercises  Shoulder      Shoulder Exercises: Supine   Protraction  PROM;5 reps;Strengthening;12 reps    Protraction Weight (lbs)  1    Horizontal ABduction  PROM;5 reps;Strengthening;10 reps    Horizontal ABduction Weight (lbs)  1    External Rotation  PROM;5 reps;Strengthening;12 reps    External Rotation Weight (lbs)  1    Internal Rotation  PROM;5 reps;Strengthening;12 reps    Internal Rotation Weight (lbs)  1    Flexion  PROM;5 reps;AROM;12 reps    Shoulder Flexion Weight (lbs)  1    ABduction  PROM;5 reps;AROM;12 reps    Shoulder ABduction Weight (lbs)  1      Shoulder Exercises: Seated   Protraction  Strengthening;10 reps    Protraction Weight (lbs)  1    Horizontal ABduction  Strengthening;10 reps    Horizontal ABduction Weight (lbs)  1    External Rotation  Strengthening;10 reps    External Rotation Limitations  1  Internal Rotation  Strengthening;10 reps    Internal Rotation Weight (lbs)  1    Flexion  Strengthening;10 reps    Flexion Weight (lbs)  1    Abduction  Strengthening;10 reps;Limitations    ABduction Weight (lbs)  1    ABduction Limitations  Focused on form and remained at 90 degrees      Shoulder Exercises: Standing   Protraction  Strengthening;10 reps    Protraction Weight (lbs)  1    Horizontal ABduction  Strengthening;10 reps    Horizontal ABduction Weight (lbs)  1    External Rotation  Strengthening;10 reps    External Rotation Weight (lbs)  1    Internal Rotation  Strengthening;10 reps    Internal Rotation Weight (lbs)  1    Flexion  Strengthening;10 reps    Shoulder Flexion Weight (lbs)  1    ABduction  Strengthening;10 reps      Shoulder Exercises: ROM/Strengthening   X to V Arms  10X    Proximal Shoulder Strengthening, Supine  12X with 1# no rest breaks    Proximal Shoulder Strengthening, Seated  12X each no rest  breaks      Shoulder Exercises: Stretch   Corner Stretch  2 reps 15"    Wall Stretch - Flexion  2 reps 15"    Wall Stretch - ABduction  2 reps 15"      Manual Therapy   Manual Therapy  Myofascial release    Manual therapy comments  manual therapy interventions completed seperately from all other interventions this date of service.      Myofascial Release  myofascial release manual stretching to right upper arm, scapular and shoulder region to decrease pain and fascial restrictions and improve pain free range of motion.  scar release to surgical scar             OT Education - 08/26/17 0945    Education provided  Yes    Education Details  shoulder strengthening with 1# weights. shoulder stretches: flexion, abduction, doorway    Person(s) Educated  Patient    Methods  Explanation;Demonstration;Handout    Comprehension  Returned demonstration;Verbalized understanding       OT Short Term Goals - 08/26/17 0948      OT SHORT TERM GOAL #1   Title  Pt will be provided with and educated on HEP for improved mobility of RUE during ADL completion.     Time  4    Period  Weeks    Status  On-going      OT SHORT TERM GOAL #2   Title  Pt will improve RUE P/ROM to [facility] to improve ability to use RUE as assist with dressing tasks.     Time  4    Period  Weeks    Status  Partially Met      OT SHORT TERM GOAL #3   Title  Pt will improve RUE strength to 3+/5 to improve ability to reach for and carry lightweight items at waist height.     Time  4    Period  Weeks        OT Long Term Goals - 07/23/17 2136      OT LONG TERM GOAL #1   Title  Pt will return to highest level of functioning during B/IADLs using RUE as dominant.     Time  8    Period  Weeks    Status  On-going      OT LONG  TERM GOAL #2   Title  Pt will decrease pain in RUE to 2/10 or less to improve ability to sleep comfortably.     Time  8    Period  Weeks    Status  On-going      OT LONG TERM GOAL #3   Title  Pt  will decrease RUE fascial restrictions to minimal amounts or less to improve mobility required for functional reaching tasks.     Time  8    Period  Weeks    Status  On-going      OT LONG TERM GOAL #4   Title  Pt will improve RUE A/ROM to [facility] to improve ability to reach up and wash/comb/fix hair.     Time  8    Period  Weeks    Status  On-going      OT LONG TERM GOAL #5   Title  Pt will improve RUE strength to 4/5 or better to improve ability to perform cooking tasks.     Time  8    Period  Weeks    Status  On-going            Plan - 08/26/17 8676    Clinical Impression Statement  A: Myofascial completed to assess fascial restrictions as patient reports some soreness in the bicep region on the right. No fascial restrictions noted this date. Progressed to 1# handweight for strengthening supine and standing. updated HEP to include shoulder strengthening and shoulder stretches.    Plan  P: D/C myofascial release. Pt is to complete 2 weeks of strengthening and then discharge with HEP per MD recommendation. Follow up on HEP. Add overhead lacing.     Consulted and Agree with Plan of Care  Patient       Patient will benefit from skilled therapeutic intervention in order to improve the following deficits and impairments:  Decreased activity tolerance, Decreased strength, Impaired flexibility, Decreased range of motion, Pain, Increased fascial restrictions, Impaired UE functional use  Visit Diagnosis: Acute pain of right shoulder  Stiffness of right shoulder, not elsewhere classified  Other symptoms and signs involving the musculoskeletal system    Problem List Patient Active Problem List   Diagnosis Date Noted  . Uncontrolled type 2 diabetes mellitus with diabetic nephropathy, with long-term current use of insulin ([place])   . UTI due to extended-spectrum beta lactamase (ESBL) producing Escherichia coli   . Pneumothorax 02/05/2017  . Multiple rib fractures 02/05/2017  . Type 2  diabetes mellitus with hypoglycemia without coma ([place]) 02/05/2017  . Clavicle fracture 02/05/2017  . Elevated blood pressure reading 02/05/2017  . UTI (urinary tract infection) 02/05/2017  . Back pain at L4-L5 level 06/14/2013  . Muscle weakness (generalized) 12/17/2012  . Pain in joint, shoulder region 12/17/2012  . Shoulder fracture 12/14/2012  . Proximal humerus fracture 11/29/2012  . FRACTURE, TOE, RIGHT 09/10/2009   Ailene Ravel, OTR/L,CBIS  [phone]  08/26/2017, 9:48 AM  [place] [address] [place], [place], [address] Phone: [phone]   Fax:  [phone]  Name: Danielle [name] MRN: [id number] Date of Birth: [birth year]

## 2017-08-26 NOTE — Patient Instructions (Signed)
Complete the following exercises 2-3 times a day.  Doorway Stretch  Place each hand opposite each other on the doorway. (You can change where you feel the stretch by moving arms higher or lower.) Step through with one foot and bend front knee until a stretch is felt and hold. Step through with the opposite foot on the next rep. Hold for __15___ seconds. Repeat __2__times.      Wall Flexion  Slide your arm up the wall or door frame until a stretch is felt in your shoulder. Hold for 15 seconds. Complete 2 times     Shoulder Abduction Stretch  Stand side ways by a wall with affected up on wall. Gently step in toward wall to feel stretch. Hold for 15 seconds. Complete 2 times.      Repeat all exercises 10-15 times, 1-2 times per day.  1) Shoulder Protraction    Begin with elbows by your side, slowly "punch" straight out in front of you.      2) Shoulder Flexion Standing:         Begin with arms at your side with thumbs pointed up, slowly raise both arms up and forward towards overhead.      3) Horizontal abduction/adduction   Standing:           Begin with arms straight out in front of you, bring out to the side in at "T" shape. Keep arms straight entire time.        4) Internal & External Rotation    *No band* -Stand with elbows at the side and elbows bent 90 degrees. Move your forearms away from your body, then bring back inward toward the body.     5) Shoulder Abduction  Standing:       Lying on your back begin with your arms flat on the table next to your side. Slowly move your arms out to the side so that they go overhead, in a jumping jack or snow angel movement.

## 2017-08-28 ENCOUNTER — Encounter (HOSPITAL_COMMUNITY): Payer: Self-pay | Admitting: Occupational Therapy

## 2017-08-28 ENCOUNTER — Ambulatory Visit (HOSPITAL_COMMUNITY): Payer: Medicare Other | Admitting: Occupational Therapy

## 2017-08-28 DIAGNOSIS — M25611 Stiffness of right shoulder, not elsewhere classified: Secondary | ICD-10-CM

## 2017-08-28 DIAGNOSIS — M25511 Pain in right shoulder: Secondary | ICD-10-CM | POA: Diagnosis not present

## 2017-08-28 DIAGNOSIS — R29898 Other symptoms and signs involving the musculoskeletal system: Secondary | ICD-10-CM

## 2017-08-28 NOTE — Therapy (Addendum)
[place], [place], [address] Phone: [phone]   Fax:  [phone]  Occupational Therapy Treatment  Patient Details  Name: Danielle [name] MRN: [id number] Date of Birth: 1931-09-26 Referring Provider: Dr. Meredith Pel   Encounter Date: 08/28/2017  OT End of Session - 08/28/17 0956    Visit Number  12    Number of Visits  16    Date for OT Re-Evaluation  09/20/17    Authorization Type  [employer] Medicare    Authorization Time Period  $40 copay    OT Start Time  [id number]    OT Stop Time  1026    OT Time Calculation (min)  44 min    Activity Tolerance  Patient tolerated treatment well    Behavior During Therapy  [facility] for tasks assessed/performed       Past Medical History:  Diagnosis Date  . Arthritis    "some joint pain once in awhile" (05/26/2017)  . History of kidney stones   . Hypertension   . Hypothyroidism (acquired)   . Pneumothorax 02/05/2017   right-after fall  . Rib fractures 02/05/2017   right side  . Type II diabetes mellitus ([place])     Past Surgical History:  Procedure Laterality Date  . AUGMENTATION MAMMAPLASTY Bilateral   . DILATION AND CURETTAGE OF UTERUS    . FRACTURE SURGERY    . REVERSE SHOULDER ARTHROPLASTY Right 05/26/2017  . REVERSE SHOULDER ARTHROPLASTY Right 05/26/2017   Procedure: REVERSE RIGHT SHOULDER ARTHROPLASTY;  Surgeon: Meredith Pel, MD;  Location: [place];  Service: Orthopedics;  Laterality: Right;  . TONSILLECTOMY    . TUBAL LIGATION      There were no vitals filed for this visit.  Subjective Assessment - 08/28/17 0944    Subjective   S: I tried those stretches she gave me.     Currently in Pain?  Yes    Pain Score  2     Pain Location  Shoulder    Pain Orientation  Right    Pain Descriptors / Indicators  Aching;Sore    Pain Type  Acute pain    Pain Radiating Towards  elbow    Pain Onset  In the past 7 days    Pain Frequency  Intermittent    Aggravating  Factors   certain movements    Pain Relieving Factors  rest    Effect of Pain on Daily Activities  min/mod effect on ADLs    Multiple Pain Sites  No         [facility] OT Assessment - 08/28/17 0943      Assessment   Medical Diagnosis  s/p Right reverse TSA      Precautions   Precautions  Shoulder    Type of Shoulder Precautions  Per MD:  A/ROM ok from 3/13-4/10, 4/11 2 weeks of strengthening and then dc to HEP               OT Treatments/Exercises (OP) - 08/28/17 0950      Exercises   Exercises  Shoulder      Shoulder Exercises: Supine   Protraction  PROM;5 reps;Strengthening;12 reps    Protraction Weight (lbs)  1    Horizontal ABduction  PROM;5 reps;Strengthening;10 reps    Horizontal ABduction Weight (lbs)  1    External Rotation  PROM;5 reps;Strengthening;12 reps    External Rotation Weight (lbs)  1    Internal Rotation  PROM;5 reps;Strengthening;12 reps  Internal Rotation Weight (lbs)  1    Flexion  PROM;5 reps;AROM;12 reps    Shoulder Flexion Weight (lbs)  1    ABduction  PROM;5 reps;AROM;12 reps    Shoulder ABduction Weight (lbs)  1      Shoulder Exercises: Sidelying   External Rotation  Strengthening;10 reps    External Rotation Weight (lbs)  1    Internal Rotation  Strengthening;10 reps    Internal Rotation Weight (lbs)  1    Flexion  Strengthening;10 reps    Flexion Weight (lbs)  1    ABduction  Strengthening;10 reps    ABduction Weight (lbs)  1    Other Sidelying Exercises  protraction, 10X, 1#    Other Sidelying Exercises  horizontal abduction, 10X, 1#      Shoulder Exercises: Standing   Protraction  Strengthening;10 reps    Protraction Weight (lbs)  1    Horizontal ABduction  Strengthening;10 reps    Horizontal ABduction Weight (lbs)  1    External Rotation  Strengthening;10 reps    External Rotation Weight (lbs)  1    Internal Rotation  Strengthening;10 reps    Internal Rotation Weight (lbs)  1    Flexion  Strengthening;10 reps    Shoulder  Flexion Weight (lbs)  1    Flexion Limitations  50% range    ABduction  Strengthening;10 reps    Shoulder ABduction Weight (lbs)  1    ABduction Limitations  50% range    Extension  Theraband;12 reps    Theraband Level (Shoulder Extension)  Level 2 (Red)    Row  Theraband;12 reps    Theraband Level (Shoulder Row)  Level 2 (Red)    Retraction  Theraband;12 reps    Theraband Level (Shoulder Retraction)  Level 2 (Red)      Shoulder Exercises: ROM/Strengthening   UBE (Upper Arm Bike)  Level 1 2' forward 2' reverse    Over Head Lace  1'30"    X to V Arms  12X    Proximal Shoulder Strengthening, Supine  12X with 1# no rest breaks    Proximal Shoulder Strengthening, Seated  12X each no rest breaks      Shoulder Exercises: Stretch   Corner Stretch  2 reps 15"    Wall Stretch - Flexion  2 reps 15"    Wall Stretch - ABduction  2 reps 15"               OT Short Term Goals - 08/26/17 0948      OT SHORT TERM GOAL #1   Title  Pt will be provided with and educated on HEP for improved mobility of RUE during ADL completion.     Time  4    Period  Weeks    Status  On-going      OT SHORT TERM GOAL #2   Title  Pt will improve RUE P/ROM to [facility] to improve ability to use RUE as assist with dressing tasks.     Time  4    Period  Weeks    Status  Partially Met      OT SHORT TERM GOAL #3   Title  Pt will improve RUE strength to 3+/5 to improve ability to reach for and carry lightweight items at waist height.     Time  4    Period  Weeks        OT Long Term Goals - 07/23/17 2136  OT LONG TERM GOAL #1   Title  Pt will return to highest level of functioning during B/IADLs using RUE as dominant.     Time  8    Period  Weeks    Status  On-going      OT LONG TERM GOAL #2   Title  Pt will decrease pain in RUE to 2/10 or less to improve ability to sleep comfortably.     Time  8    Period  Weeks    Status  On-going      OT LONG TERM GOAL #3   Title  Pt will decrease RUE  fascial restrictions to minimal amounts or less to improve mobility required for functional reaching tasks.     Time  8    Period  Weeks    Status  On-going      OT LONG TERM GOAL #4   Title  Pt will improve RUE A/ROM to [facility] to improve ability to reach up and wash/comb/fix hair.     Time  8    Period  Weeks    Status  On-going      OT LONG TERM GOAL #5   Title  Pt will improve RUE strength to 4/5 or better to improve ability to perform cooking tasks.     Time  8    Period  Weeks    Status  On-going            Plan - 08/28/17 0956    Clinical Impression Statement        Plan  A: Pt reports she completed shoulder stretches with min difficulty. Continued with passive stretching and shoulder/scapular strengthening this session. Verbal cuing for form and technique, also requiring cuing to slow down and stretch to end range. Added sidelying strengthening for improved shoulder and scapular stability, min/mod difficulty. Intermittent rest breaks provided for fatigue during session.   P: D/C myofascial release, continue with sidelying strengthening exercises          Patient will benefit from skilled therapeutic intervention in order to improve the following deficits and impairments:  Decreased activity tolerance, Decreased strength, Impaired flexibility, Decreased range of motion, Pain, Increased fascial restrictions, Impaired UE functional use  Visit Diagnosis: Acute pain of right shoulder  Stiffness of right shoulder, not elsewhere classified  Other symptoms and signs involving the musculoskeletal system    Problem List Patient Active Problem List   Diagnosis Date Noted  . Uncontrolled type 2 diabetes mellitus with diabetic nephropathy, with long-term current use of insulin ([place])   . UTI due to extended-spectrum beta lactamase (ESBL) producing Escherichia coli   . Pneumothorax 02/05/2017  . Multiple rib fractures 02/05/2017  . Type 2 diabetes mellitus with  hypoglycemia without coma ([place]) 02/05/2017  . Clavicle fracture 02/05/2017  . Elevated blood pressure reading 02/05/2017  . UTI (urinary tract infection) 02/05/2017  . Back pain at L4-L5 level 06/14/2013  . Muscle weakness (generalized) 12/17/2012  . Pain in joint, shoulder region 12/17/2012  . Shoulder fracture 12/14/2012  . Proximal humerus fracture 11/29/2012  . FRACTURE, TOE, RIGHT 09/10/2009   Guadelupe Sabin, OTR/L  [phone] 08/28/2017, 10:29 AM  [place] [address] [place], [place], [address] Phone: [phone]   Fax:  [phone]  Name: Danielle [name] MRN: [id number] Date of Birth: 1931/09/21

## 2017-09-02 ENCOUNTER — Ambulatory Visit (HOSPITAL_COMMUNITY): Payer: Medicare Other

## 2017-09-02 ENCOUNTER — Other Ambulatory Visit: Payer: Self-pay

## 2017-09-02 ENCOUNTER — Encounter (HOSPITAL_COMMUNITY): Payer: Self-pay

## 2017-09-02 DIAGNOSIS — M25511 Pain in right shoulder: Secondary | ICD-10-CM | POA: Diagnosis not present

## 2017-09-02 DIAGNOSIS — M25611 Stiffness of right shoulder, not elsewhere classified: Secondary | ICD-10-CM

## 2017-09-02 DIAGNOSIS — R29898 Other symptoms and signs involving the musculoskeletal system: Secondary | ICD-10-CM

## 2017-09-02 NOTE — Therapy (Signed)
[place], [place], [address] Phone: [phone]   Fax:  [phone]  Occupational Therapy Treatment  Patient Details  Name: Danielle [name] MRN: [id number] Date of Birth: 1931/11/13 Referring Provider: Dr. Meredith Pel   Encounter Date: 09/02/2017  OT End of Session - 09/02/17 1121    Visit Number  13    Number of Visits  16    Date for OT Re-Evaluation  09/20/17    Authorization Type  UHC Medicare    Authorization Time Period  $40 copay    OT Start Time  0905    OT Stop Time  0945    OT Time Calculation (min)  40 min    Activity Tolerance  Patient tolerated treatment well    Behavior During Therapy  [facility] for tasks assessed/performed       Past Medical History:  Diagnosis Date  . Arthritis    "some joint pain once in awhile" (05/26/2017)  . History of kidney stones   . Hypertension   . Hypothyroidism (acquired)   . Pneumothorax 02/05/2017   right-after fall  . Rib fractures 02/05/2017   right side  . Type II diabetes mellitus ([facility])     Past Surgical History:  Procedure Laterality Date  . AUGMENTATION MAMMAPLASTY Bilateral   . DILATION AND CURETTAGE OF UTERUS    . FRACTURE SURGERY    . REVERSE SHOULDER ARTHROPLASTY Right 05/26/2017  . REVERSE SHOULDER ARTHROPLASTY Right 05/26/2017   Procedure: REVERSE RIGHT SHOULDER ARTHROPLASTY;  Surgeon: Meredith Pel, MD;  Location: [facility];  Service: Orthopedics;  Laterality: Right;  . TONSILLECTOMY    . TUBAL LIGATION      There were no vitals filed for this visit.  Subjective Assessment - 09/02/17 1119    Subjective   S: I use water bottles to work on my exercises.     Currently in Pain?  No/denies         [facility] OT Assessment - 09/02/17 0920      Assessment   Medical Diagnosis  s/p Right reverse TSA      Precautions   Precautions  Shoulder    Type of Shoulder Precautions  Per MD:  A/ROM ok from 3/13-4/10, 4/11 2 weeks of strengthening  and then dc to HEP               OT Treatments/Exercises (OP) - 09/02/17 0920      Exercises   Exercises  Shoulder      Shoulder Exercises: Supine   Protraction  PROM;5 reps;Strengthening;12 reps    Protraction Weight (lbs)  1    Horizontal ABduction  PROM;5 reps;Strengthening;12 reps    Horizontal ABduction Weight (lbs)  1    External Rotation  PROM;5 reps;Strengthening;12 reps    External Rotation Weight (lbs)  1    Internal Rotation  PROM;5 reps;Strengthening;15 reps    Internal Rotation Weight (lbs)  1    Flexion  PROM;5 reps;Strengthening;12 reps    Shoulder Flexion Weight (lbs)  1    ABduction  PROM;5 reps;Strengthening;12 reps    Shoulder ABduction Weight (lbs)  1      Shoulder Exercises: Sidelying   External Rotation  Strengthening;12 reps    External Rotation Weight (lbs)  1    Internal Rotation  Strengthening;12 reps    Internal Rotation Weight (lbs)  1    Flexion  Strengthening;12 reps    Flexion Weight (lbs)  1  ABduction  Strengthening;12 reps    ABduction Weight (lbs)  1    Other Sidelying Exercises  protraction, 12X, 1#    Other Sidelying Exercises  horizontal abduction, 12X, 1#      Shoulder Exercises: Standing   Protraction  Strengthening;10 reps    Protraction Weight (lbs)  1    Horizontal ABduction  Strengthening;10 reps    Horizontal ABduction Weight (lbs)  1    External Rotation  Strengthening;10 reps    External Rotation Weight (lbs)  1    Internal Rotation  Strengthening;10 reps    Internal Rotation Weight (lbs)  1    Flexion  Strengthening;12 reps    Shoulder Flexion Weight (lbs)  1    Flexion Limitations  50% range    ABduction  Strengthening;12 reps    Shoulder ABduction Weight (lbs)  1    ABduction Limitations  50% range      Shoulder Exercises: ROM/Strengthening   Proximal Shoulder Strengthening, Supine  12X with 1# no rest breaks    Proximal Shoulder Strengthening, Seated  12X each no rest breaks               OT  Short Term Goals - 08/26/17 0948      OT SHORT TERM GOAL #1   Title  Pt will be provided with and educated on HEP for improved mobility of RUE during ADL completion.     Time  4    Period  Weeks    Status  On-going      OT SHORT TERM GOAL #2   Title  Pt will improve RUE P/ROM to [facility] to improve ability to use RUE as assist with dressing tasks.     Time  4    Period  Weeks    Status  Partially Met      OT SHORT TERM GOAL #3   Title  Pt will improve RUE strength to 3+/5 to improve ability to reach for and carry lightweight items at waist height.     Time  4    Period  Weeks        OT Long Term Goals - 07/23/17 2136      OT LONG TERM GOAL #1   Title  Pt will return to highest level of functioning during B/IADLs using RUE as dominant.     Time  8    Period  Weeks    Status  On-going      OT LONG TERM GOAL #2   Title  Pt will decrease pain in RUE to 2/10 or less to improve ability to sleep comfortably.     Time  8    Period  Weeks    Status  On-going      OT LONG TERM GOAL #3   Title  Pt will decrease RUE fascial restrictions to minimal amounts or less to improve mobility required for functional reaching tasks.     Time  8    Period  Weeks    Status  On-going      OT LONG TERM GOAL #4   Title  Pt will improve RUE A/ROM to [facility] to improve ability to reach up and wash/comb/fix hair.     Time  8    Period  Weeks    Status  On-going      OT LONG TERM GOAL #5   Title  Pt will improve RUE strength to 4/5 or better to improve ability to perform cooking tasks.  Time  8    Period  Weeks    Status  On-going            Plan - 09/02/17 1121    Clinical Impression Statement  A: Patient reports that she is doing well with all daily tasks at home although wishes to finish her next 2 weeks of therapy before discharging per MD orders. VC needed for form and technique. patient was able to increase strengthening repetitions to 12 this session.     Plan  P: D/C myofascial  release. Add overhead lacing. Continue with strengthening.     Consulted and Agree with Plan of Care  Patient       Patient will benefit from skilled therapeutic intervention in order to improve the following deficits and impairments:  Decreased activity tolerance, Decreased strength, Impaired flexibility, Decreased range of motion, Pain, Increased fascial restrictions, Impaired UE functional use  Visit Diagnosis: Acute pain of right shoulder  Stiffness of right shoulder, not elsewhere classified  Other symptoms and signs involving the musculoskeletal system    Problem List Patient Active Problem List   Diagnosis Date Noted  . Uncontrolled type 2 diabetes mellitus with diabetic nephropathy, with long-term current use of insulin ([place])   . UTI due to extended-spectrum beta lactamase (ESBL) producing Escherichia coli   . Pneumothorax 02/05/2017  . Multiple rib fractures 02/05/2017  . Type 2 diabetes mellitus with hypoglycemia without coma ([place]) 02/05/2017  . Clavicle fracture 02/05/2017  . Elevated blood pressure reading 02/05/2017  . UTI (urinary tract infection) 02/05/2017  . Back pain at L4-L5 level 06/14/2013  . Muscle weakness (generalized) 12/17/2012  . Pain in joint, shoulder region 12/17/2012  . Shoulder fracture 12/14/2012  . Proximal humerus fracture 11/29/2012  . FRACTURE, TOE, RIGHT 09/10/2009   Ailene Ravel, OTR/L,CBIS  [phone]  09/02/2017, 11:23 AM  [place] [address] [place], [place], [address] Phone: [phone]   Fax:  [phone]  Name: Danielle [name] MRN: [id number] Date of Birth: 31-May-1931

## 2017-09-04 ENCOUNTER — Ambulatory Visit (HOSPITAL_COMMUNITY): Payer: Medicare Other | Admitting: Occupational Therapy

## 2017-09-04 ENCOUNTER — Encounter (HOSPITAL_COMMUNITY): Payer: Self-pay | Admitting: Occupational Therapy

## 2017-09-04 DIAGNOSIS — M25511 Pain in right shoulder: Secondary | ICD-10-CM | POA: Diagnosis not present

## 2017-09-04 DIAGNOSIS — M25611 Stiffness of right shoulder, not elsewhere classified: Secondary | ICD-10-CM

## 2017-09-04 DIAGNOSIS — R29898 Other symptoms and signs involving the musculoskeletal system: Secondary | ICD-10-CM

## 2017-09-04 NOTE — Therapy (Signed)
[place], [place], [address] Phone: [phone]   Fax:  [phone]  Occupational Therapy Treatment  Patient Details  Name: Danielle [name] MRN: [id number] Date of Birth: [birth year] Referring Provider: Dr. Meredith Pel   Encounter Date: 09/04/2017  OT End of Session - 09/04/17 1032    Visit Number  14    Number of Visits  16    Date for OT Re-Evaluation  09/20/17    Authorization Type  UHC Medicare    Authorization Time Period  $40 copay    OT Start Time  0946    OT Stop Time  1029    OT Time Calculation (min)  43 min    Activity Tolerance  Patient tolerated treatment well    Behavior During Therapy  [facility] for tasks assessed/performed       Past Medical History:  Diagnosis Date  . Arthritis    "some joint pain once in awhile" (05/26/2017)  . History of kidney stones   . Hypertension   . Hypothyroidism (acquired)   . Pneumothorax 02/05/2017   right-after fall  . Rib fractures 02/05/2017   right side  . Type II diabetes mellitus ([place])     Past Surgical History:  Procedure Laterality Date  . AUGMENTATION MAMMAPLASTY Bilateral   . DILATION AND CURETTAGE OF UTERUS    . FRACTURE SURGERY    . REVERSE SHOULDER ARTHROPLASTY Right 05/26/2017  . REVERSE SHOULDER ARTHROPLASTY Right 05/26/2017   Procedure: REVERSE RIGHT SHOULDER ARTHROPLASTY;  Surgeon: Meredith Pel, MD;  Location: [facility];  Service: Orthopedics;  Laterality: Right;  . TONSILLECTOMY    . TUBAL LIGATION      There were no vitals filed for this visit.  Subjective Assessment - 09/04/17 0946    Subjective   S: I'm going to go get some 1 pound weights.     Currently in Pain?  No/denies         [facility] OT Assessment - 09/04/17 0946      Assessment   Medical Diagnosis  s/p Right reverse TSA      Precautions   Precautions  Shoulder    Type of Shoulder Precautions  Per MD:  A/ROM ok from 3/13-4/10, 4/11 2 weeks of strengthening  and then dc to HEP               OT Treatments/Exercises (OP) - 09/04/17 0948      Exercises   Exercises  Shoulder      Shoulder Exercises: Supine   Protraction  PROM;5 reps;Strengthening;15 reps    Protraction Weight (lbs)  1    Horizontal ABduction  PROM;5 reps;Strengthening;15 reps    Horizontal ABduction Weight (lbs)  1    External Rotation  PROM;5 reps;Strengthening;15 reps    External Rotation Weight (lbs)  1    Internal Rotation  PROM;5 reps;Strengthening;15 reps    Internal Rotation Weight (lbs)  1    Flexion  PROM;5 reps;Strengthening;15 reps    Shoulder Flexion Weight (lbs)  1    ABduction  PROM;5 reps;Strengthening;15 reps    Shoulder ABduction Weight (lbs)  1      Shoulder Exercises: Seated   Protraction  Strengthening;12 reps    Protraction Weight (lbs)  1    Horizontal ABduction  Strengthening;12 reps    Horizontal ABduction Weight (lbs)  1    External Rotation  Strengthening;12 reps    External Rotation Weight (lbs)  1  Internal Rotation  Strengthening;12 reps    Internal Rotation Weight (lbs)  1    Flexion  Strengthening;12 reps    Flexion Weight (lbs)  1    Abduction  Strengthening;12 reps    ABduction Weight (lbs)  1      Shoulder Exercises: Sidelying   External Rotation  Strengthening;12 reps    External Rotation Weight (lbs)  1    Internal Rotation  Strengthening;12 reps    Internal Rotation Weight (lbs)  1    Flexion  Strengthening;12 reps    Flexion Weight (lbs)  1    ABduction  Strengthening;12 reps    ABduction Weight (lbs)  1    Other Sidelying Exercises  protraction, 12X, 1#    Other Sidelying Exercises  horizontal abduction, 12X, 1#      Shoulder Exercises: Standing   Horizontal ABduction  Theraband;10 reps    Theraband Level (Shoulder Horizontal ABduction)  Level 2 (Red)    External Rotation  Theraband;10 reps    Theraband Level (Shoulder External Rotation)  Level 2 (Red)    Extension  Theraband;12 reps    Theraband Level  (Shoulder Extension)  Level 2 (Red)    Row  Theraband;12 reps    Theraband Level (Shoulder Row)  Level 2 (Red)    Retraction  Theraband;12 reps    Theraband Level (Shoulder Retraction)  Level 2 (Red)      Shoulder Exercises: Therapy Ball   Other Therapy Ball Exercises  green therapy ball: chest press, flexion, 10X each      Shoulder Exercises: ROM/Strengthening   UBE (Upper Arm Bike)  Level 1 2' forward 2' reverse    X to V Arms  10X, 1#    Proximal Shoulder Strengthening, Supine  12X with 1# no rest breaks    Proximal Shoulder Strengthening, Seated  10X each 1#, 1 rest break               OT Short Term Goals - 08/26/17 0948      OT SHORT TERM GOAL #1   Title  Pt will be provided with and educated on HEP for improved mobility of RUE during ADL completion.     Time  4    Period  Weeks    Status  On-going      OT SHORT TERM GOAL #2   Title  Pt will improve RUE P/ROM to [facility] to improve ability to use RUE as assist with dressing tasks.     Time  4    Period  Weeks    Status  Partially Met      OT SHORT TERM GOAL #3   Title  Pt will improve RUE strength to 3+/5 to improve ability to reach for and carry lightweight items at waist height.     Time  4    Period  Weeks        OT Long Term Goals - 07/23/17 2136      OT LONG TERM GOAL #1   Title  Pt will return to highest level of functioning during B/IADLs using RUE as dominant.     Time  8    Period  Weeks    Status  On-going      OT LONG TERM GOAL #2   Title  Pt will decrease pain in RUE to 2/10 or less to improve ability to sleep comfortably.     Time  8    Period  Weeks    Status  On-going      OT LONG TERM GOAL #3   Title  Pt will decrease RUE fascial restrictions to minimal amounts or less to improve mobility required for functional reaching tasks.     Time  8    Period  Weeks    Status  On-going      OT LONG TERM GOAL #4   Title  Pt will improve RUE A/ROM to [facility] to improve ability to reach up and  wash/comb/fix hair.     Time  8    Period  Weeks    Status  On-going      OT LONG TERM GOAL #5   Title  Pt will improve RUE strength to 4/5 or better to improve ability to perform cooking tasks.     Time  8    Period  Weeks    Status  On-going            Plan - 09/04/17 1032    Clinical Impression Statement  A: Continued with shoulder strengthening this session adding horizontal abduction and er with red theraband, green therapy ball exercises, and added weights to x to v arms and seated proximal shoulder strengthening. Verbal cuing for form and reaching to end ROM, occasional rest breaks for fatigue.     Plan  P: Continue with strengthening exercises adding flexion with red theraband       Patient will benefit from skilled therapeutic intervention in order to improve the following deficits and impairments:  Decreased activity tolerance, Decreased strength, Impaired flexibility, Decreased range of motion, Pain, Increased fascial restrictions, Impaired UE functional use  Visit Diagnosis: Acute pain of right shoulder  Stiffness of right shoulder, not elsewhere classified  Other symptoms and signs involving the musculoskeletal system    Problem List Patient Active Problem List   Diagnosis Date Noted  . Uncontrolled type 2 diabetes mellitus with diabetic nephropathy, with long-term current use of insulin ([place])   . UTI due to extended-spectrum beta lactamase (ESBL) producing Escherichia coli   . Pneumothorax 02/05/2017  . Multiple rib fractures 02/05/2017  . Type 2 diabetes mellitus with hypoglycemia without coma ([place]) 02/05/2017  . Clavicle fracture 02/05/2017  . Elevated blood pressure reading 02/05/2017  . UTI (urinary tract infection) 02/05/2017  . Back pain at L4-L5 level 06/14/2013  . Muscle weakness (generalized) 12/17/2012  . Pain in joint, shoulder region 12/17/2012  . Shoulder fracture 12/14/2012  . Proximal humerus fracture 11/29/2012  . FRACTURE, TOE, RIGHT  09/10/2009   Guadelupe Sabin, OTR/L  [phone] 09/04/2017, 10:34 AM  [facility] [address] [place], [place], [address] Phone: [phone]   Fax:  [phone]  Name: Danielle [name] MRN: [id number] Date of Birth: 04/10/1932

## 2017-09-09 ENCOUNTER — Ambulatory Visit (HOSPITAL_COMMUNITY): Payer: Medicare Other | Admitting: Occupational Therapy

## 2017-09-09 ENCOUNTER — Encounter (HOSPITAL_COMMUNITY): Payer: Self-pay | Admitting: Occupational Therapy

## 2017-09-09 DIAGNOSIS — M25611 Stiffness of right shoulder, not elsewhere classified: Secondary | ICD-10-CM

## 2017-09-09 DIAGNOSIS — M25511 Pain in right shoulder: Secondary | ICD-10-CM | POA: Diagnosis not present

## 2017-09-09 DIAGNOSIS — R29898 Other symptoms and signs involving the musculoskeletal system: Secondary | ICD-10-CM

## 2017-09-09 NOTE — Therapy (Signed)
[place], [place], [address] Phone: [phone]   Fax:  [phone]  Occupational Therapy Treatment  Patient Details  Name: Danielle [name] MRN: [id number] Date of Birth: 05-26-1931 Referring Provider: Dr. Meredith Pel   Encounter Date: 09/09/2017  OT End of Session - 09/09/17 0906    Visit Number  15    Number of Visits  16    Date for OT Re-Evaluation  09/20/17    Authorization Type  UHC Medicare    Authorization Time Period  $40 copay    OT Start Time  0855    OT Stop Time  0938    OT Time Calculation (min)  43 min    Activity Tolerance  Patient tolerated treatment well    Behavior During Therapy  [facility] for tasks assessed/performed       Past Medical History:  Diagnosis Date  . Arthritis    "some joint pain once in awhile" (05/26/2017)  . History of kidney stones   . Hypertension   . Hypothyroidism (acquired)   . Pneumothorax 02/05/2017   right-after fall  . Rib fractures 02/05/2017   right side  . Type II diabetes mellitus ([place])     Past Surgical History:  Procedure Laterality Date  . AUGMENTATION MAMMAPLASTY Bilateral   . DILATION AND CURETTAGE OF UTERUS    . FRACTURE SURGERY    . REVERSE SHOULDER ARTHROPLASTY Right 05/26/2017  . REVERSE SHOULDER ARTHROPLASTY Right 05/26/2017   Procedure: REVERSE RIGHT SHOULDER ARTHROPLASTY;  Surgeon: Meredith Pel, MD;  Location: [facility];  Service: Orthopedics;  Laterality: Right;  . TONSILLECTOMY    . TUBAL LIGATION      There were no vitals filed for this visit.  Subjective Assessment - 09/09/17 0856    Subjective   S: My arm feels a little strange sometimes-just heavy feeling.     Currently in Pain?  No/denies         [facility] OT Assessment - 09/09/17 0856      Assessment   Medical Diagnosis  s/p Right reverse TSA      Precautions   Precautions  Shoulder    Type of Shoulder Precautions  Per MD:  A/ROM ok from 3/13-4/10, 4/11 2 weeks  of strengthening and then dc to HEP               OT Treatments/Exercises (OP) - 09/09/17 0857      Exercises   Exercises  Shoulder      Shoulder Exercises: Supine   Protraction  PROM;5 reps;Strengthening;15 reps    Protraction Weight (lbs)  1    Horizontal ABduction  PROM;5 reps;Strengthening;10 reps    Horizontal ABduction Weight (lbs)  2    External Rotation  PROM;5 reps;Strengthening;10 reps    External Rotation Weight (lbs)  2    Internal Rotation  PROM;5 reps;Strengthening;10 reps    Internal Rotation Weight (lbs)  2    Flexion  PROM;5 reps;Strengthening;10 reps    Shoulder Flexion Weight (lbs)  2    ABduction  PROM;5 reps;Strengthening;10 reps    Shoulder ABduction Weight (lbs)  2      Shoulder Exercises: Sidelying   External Rotation  Strengthening;12 reps    External Rotation Weight (lbs)  1    Internal Rotation  Strengthening;12 reps    Internal Rotation Weight (lbs)  1    Flexion  Strengthening;12 reps    Flexion Weight (lbs)  1  ABduction  Strengthening;12 reps    ABduction Weight (lbs)  1    Other Sidelying Exercises  protraction, 12X, 1#    Other Sidelying Exercises  horizontal abduction, 12X, 1#      Shoulder Exercises: Standing   Protraction  Theraband;10 reps    Theraband Level (Shoulder Protraction)  Level 2 (Red)    Horizontal ABduction  Theraband;10 reps    Theraband Level (Shoulder Horizontal ABduction)  Level 2 (Red)    External Rotation  Theraband;10 reps    Theraband Level (Shoulder External Rotation)  Level 2 (Red)    Internal Rotation  Theraband;10 reps    Theraband Level (Shoulder Internal Rotation)  Level 2 (Red)    Flexion  Theraband;10 reps    Theraband Level (Shoulder Flexion)  Level 2 (Red)    Extension  Theraband;12 reps    Theraband Level (Shoulder Extension)  Level 2 (Red)    Row  Theraband;12 reps    Theraband Level (Shoulder Row)  Level 2 (Red)    Retraction  Theraband;12 reps    Theraband Level (Shoulder Retraction)   Level 2 (Red)      Shoulder Exercises: Therapy Ball   Other Therapy Ball Exercises  green therapy ball: chest press, flexion, 10X each      Shoulder Exercises: ROM/Strengthening   UBE (Upper Arm Bike)  Level 1 3' forward 3' reverse    Over Head Lace  1' with 1#    X to V Arms  10X, 1#    Proximal Shoulder Strengthening, Supine  10X each, 2#, 1 rest break    Proximal Shoulder Strengthening, Seated  10X each 1#, 3 rest breaks    Ball on Wall  1' flexion, 1 rest break               OT Short Term Goals - 08/26/17 0948      OT SHORT TERM GOAL #1   Title  Pt will be provided with and educated on HEP for improved mobility of RUE during ADL completion.     Time  4    Period  Weeks    Status  On-going      OT SHORT TERM GOAL #2   Title  Pt will improve RUE P/ROM to [facility] to improve ability to use RUE as assist with dressing tasks.     Time  4    Period  Weeks    Status  Partially Met      OT SHORT TERM GOAL #3   Title  Pt will improve RUE strength to 3+/5 to improve ability to reach for and carry lightweight items at waist height.     Time  4    Period  Weeks        OT Long Term Goals - 07/23/17 2136      OT LONG TERM GOAL #1   Title  Pt will return to highest level of functioning during B/IADLs using RUE as dominant.     Time  8    Period  Weeks    Status  On-going      OT LONG TERM GOAL #2   Title  Pt will decrease pain in RUE to 2/10 or less to improve ability to sleep comfortably.     Time  8    Period  Weeks    Status  On-going      OT LONG TERM GOAL #3   Title  Pt will decrease RUE fascial restrictions to minimal amounts or  less to improve mobility required for functional reaching tasks.     Time  8    Period  Weeks    Status  On-going      OT LONG TERM GOAL #4   Title  Pt will improve RUE A/ROM to [facility] to improve ability to reach up and wash/comb/fix hair.     Time  8    Period  Weeks    Status  On-going      OT LONG TERM GOAL #5   Title  Pt will  improve RUE strength to 4/5 or better to improve ability to perform cooking tasks.     Time  8    Period  Weeks    Status  On-going            Plan - 09/09/17 0906    Clinical Impression Statement  A: Progressed to 2# weights in supine today as pt only has 2# at home, completing less repetitions, continued with 1# in sidelying and seated. Continued with green therapy ball strengthening, adding red theraband flexion, ball on wall, and 1# weight to overhead lacing. Pt completed with occasional rest breaks, continued verbal cuing for form and to slow down.      Plan  P: Reassess, FOTO, Discharge with HEP       Patient will benefit from skilled therapeutic intervention in order to improve the following deficits and impairments:  Decreased activity tolerance, Decreased strength, Impaired flexibility, Decreased range of motion, Pain, Increased fascial restrictions, Impaired UE functional use  Visit Diagnosis: Acute pain of right shoulder  Stiffness of right shoulder, not elsewhere classified  Other symptoms and signs involving the musculoskeletal system    Problem List Patient Active Problem List   Diagnosis Date Noted  . Uncontrolled type 2 diabetes mellitus with diabetic nephropathy, with long-term current use of insulin ([place])   . UTI due to extended-spectrum beta lactamase (ESBL) producing Escherichia coli   . Pneumothorax 02/05/2017  . Multiple rib fractures 02/05/2017  . Type 2 diabetes mellitus with hypoglycemia without coma ([place]) 02/05/2017  . Clavicle fracture 02/05/2017  . Elevated blood pressure reading 02/05/2017  . UTI (urinary tract infection) 02/05/2017  . Back pain at L4-L5 level 06/14/2013  . Muscle weakness (generalized) 12/17/2012  . Pain in joint, shoulder region 12/17/2012  . Shoulder fracture 12/14/2012  . Proximal humerus fracture 11/29/2012  . FRACTURE, TOE, RIGHT 09/10/2009   Guadelupe Sabin, OTR/L  [phone] 09/09/2017, 9:40 AM  [place] [address] [place], [place], [address] Phone: [phone]   Fax:  [phone]  Name: Danielle [name] MRN: [id number] Date of Birth: Jun 23, 1931

## 2017-09-11 ENCOUNTER — Encounter (HOSPITAL_COMMUNITY): Payer: Self-pay | Admitting: Occupational Therapy

## 2017-09-11 ENCOUNTER — Ambulatory Visit (HOSPITAL_COMMUNITY): Payer: Medicare Other | Admitting: Occupational Therapy

## 2017-09-11 DIAGNOSIS — R29898 Other symptoms and signs involving the musculoskeletal system: Secondary | ICD-10-CM

## 2017-09-11 DIAGNOSIS — M25511 Pain in right shoulder: Secondary | ICD-10-CM | POA: Diagnosis not present

## 2017-09-11 DIAGNOSIS — M25611 Stiffness of right shoulder, not elsewhere classified: Secondary | ICD-10-CM

## 2017-09-11 NOTE — Patient Instructions (Signed)
(  Home) Extension: Isometric / Bilateral Arm Retraction - Sitting   Facing anchor, hold hands and elbow at shoulder height, with elbow bent.  Pull arms back to squeeze shoulder blades together. Repeat 10-15 times. 1-3 times/day.   (Clinic) Extension / Flexion (Assist)   Face anchor, pull arms back, keeping elbow straight, and squeze shoulder blades together. Repeat 10-15 times. 1-3 times/day.   Copyright  VHI. All rights reserved.   (Home) Retraction: Row - Bilateral (Anchor)   Facing anchor, arms reaching forward, pull hands toward stomach, keeping elbows bent and at your sides and pinching shoulder blades together. Repeat 10-15 times. 1-3 times/day.   Copyright  VHI. All rights reserved.      Theraband strengthening: Complete 10-15X, 1-2X/day  1) Shoulder protraction  Anchor band in doorway, stand with back to door. Push your hand forward as much as you can to bringing your shoulder blades forward on your rib cage.     2) Shoulder flexion  While standing with back to the door, holding Theraband at hand level, raise arm in front of you.  Keep elbow straight through entire movement.      3) Shoulder horizontal abduction  Standing with a theraband anchored at chest height, begin with arm straight and some tension in the band. Move your arm out to your side (keeping straight the whole time). Bring the affected arm back to midline.     4) Shoulder Internal Rotation  While holding an elastic band at your side with your elbow bent, start with your hand away from your stomach, then pull the band towards your stomach. Keep your elbow near your side the entire time.     5) Shoulder External Rotation  While holding an elastic band at your side with your elbow bent, start with your hand near your stomach and then pull the band away. Keep your elbow at your side the entire time.     6) Shoulder abduction  While holding an elastic band at your side, draw up your  arm to the side keeping your elbow straight.

## 2017-09-11 NOTE — Therapy (Signed)
[facility] [place], [place], [address] Phone: [phone]   Fax:  [phone]  Occupational Therapy Reassessment, Treatment, and Discharge  Patient Details  Name: Danielle [name] MRN: [id number] Date of Birth: [birth year] Referring Provider: Dr. Meredith Pel   Encounter Date: 09/11/2017  OT End of Session - 09/11/17 1023    Visit Number  16    Number of Visits  16    Date for OT Re-Evaluation  09/20/17    Authorization Type  [employer] Medicare    Authorization Time Period  $40 copay    OT Start Time  0946    OT Stop Time  1020    OT Time Calculation (min)  34 min    Activity Tolerance  Patient tolerated treatment well    Behavior During Therapy  [facility] for tasks assessed/performed       Past Medical History:  Diagnosis Date  . Arthritis    "some joint pain once in awhile" (05/26/2017)  . History of kidney stones   . Hypertension   . Hypothyroidism (acquired)   . Pneumothorax 02/05/2017   right-after fall  . Rib fractures 02/05/2017   right side  . Type II diabetes mellitus ([place])     Past Surgical History:  Procedure Laterality Date  . AUGMENTATION MAMMAPLASTY Bilateral   . DILATION AND CURETTAGE OF UTERUS    . FRACTURE SURGERY    . REVERSE SHOULDER ARTHROPLASTY Right 05/26/2017  . REVERSE SHOULDER ARTHROPLASTY Right 05/26/2017   Procedure: REVERSE RIGHT SHOULDER ARTHROPLASTY;  Surgeon: Meredith Pel, MD;  Location: [facility];  Service: Orthopedics;  Laterality: Right;  . TONSILLECTOMY    . TUBAL LIGATION      There were no vitals filed for this visit.  Subjective Assessment - 09/11/17 0947    Subjective   S: I'm just working on my arms all the time.     Currently in Pain?  No/denies         [facility] OT Assessment - 09/11/17 0947      Assessment   Medical Diagnosis  s/p Right reverse TSA      Precautions   Precautions  Shoulder    Type of Shoulder Precautions  Per MD:  A/ROM ok from 3/13-4/10,  4/11 2 weeks of strengthening and then dc to HEP      Observation/Other Assessments   Focus on Therapeutic Outcomes (FOTO)   58/100       AROM   Overall AROM Comments  Assessed seated, er/IR adducted    AROM Assessment Site  Shoulder    Right/Left Shoulder  Right    Right Shoulder Flexion  105 Degrees 104 previous    Right Shoulder ABduction  95 Degrees same as previous    Right Shoulder Internal Rotation  90 Degrees same as previous    Right Shoulder External Rotation  65 Degrees 49 previous      PROM   Overall PROM Comments  Assessed supine, er/IR adducted    PROM Assessment Site  Shoulder    Right/Left Shoulder  Right    Right Shoulder Flexion  136 Degrees same as previous    Right Shoulder ABduction  155 Degrees 146 previous    Right Shoulder Internal Rotation  90 Degrees same as previous    Right Shoulder External Rotation  78 Degrees 76  previous      Strength   Overall Strength Comments  Assessed seated, er/IR adducted    Strength Assessment  Site  Shoulder    Right/Left Shoulder  Right    Right Shoulder Flexion  4/5 4-/5 previous    Right Shoulder ABduction  4-/5 3+/5 previous    Right Shoulder Internal Rotation  4+/5 4/5 previous    Right Shoulder External Rotation  3/5 same as previous               OT Treatments/Exercises (OP) - 09/11/17 0947      Exercises   Exercises  Shoulder      Shoulder Exercises: Supine   Protraction  PROM;5 reps    Horizontal ABduction  PROM;5 reps    External Rotation  PROM;5 reps    Internal Rotation  PROM;5 reps    Flexion  PROM;5 reps    ABduction  PROM;5 reps      Shoulder Exercises: Sidelying   External Rotation  Strengthening;12 reps    External Rotation Weight (lbs)  1    Internal Rotation  Strengthening;12 reps    Internal Rotation Weight (lbs)  1    Flexion  Strengthening;12 reps    Flexion Weight (lbs)  1    ABduction  Strengthening;12 reps    ABduction Weight (lbs)  1    Other Sidelying Exercises   protraction, 12X, 1#    Other Sidelying Exercises  horizontal abduction, 12X, 1#      Shoulder Exercises: Standing   Protraction  Theraband;10 reps    Theraband Level (Shoulder Protraction)  Level 2 (Red)    Horizontal ABduction  Theraband;10 reps    Theraband Level (Shoulder Horizontal ABduction)  Level 2 (Red)    External Rotation  Theraband;10 reps    Theraband Level (Shoulder External Rotation)  Level 2 (Red)    Internal Rotation  Theraband;10 reps    Theraband Level (Shoulder Internal Rotation)  Level 2 (Red)    Flexion  Theraband;10 reps    Theraband Level (Shoulder Flexion)  Level 2 (Red)    ABduction  Theraband;10 reps    Theraband Level (Shoulder ABduction)  Level 2 (Red)    Extension  Theraband;12 reps    Theraband Level (Shoulder Extension)  Level 2 (Red)    Row  Theraband;12 reps    Theraband Level (Shoulder Row)  Level 2 (Red)    Retraction  Theraband;12 reps    Theraband Level (Shoulder Retraction)  Level 2 (Red)             OT Education - 09/11/17 1007    Education provided  Yes    Education Details  scapular theraband and theraband strengthening    Person(s) Educated  Patient    Methods  Explanation;Demonstration;Handout    Comprehension  Verbalized understanding;Returned demonstration       OT Short Term Goals - 09/11/17 0956      OT SHORT TERM GOAL #1   Title  Pt will be provided with and educated on HEP for improved mobility of RUE during ADL completion.     Time  4    Period  Weeks    Status  Achieved      OT SHORT TERM GOAL #2   Title  Pt will improve RUE P/ROM to [facility] to improve ability to use RUE as assist with dressing tasks.     Time  4    Period  Weeks    Status  Achieved      OT SHORT TERM GOAL #3   Title  Pt will improve RUE strength to 3+/5 to improve ability to reach  for and carry lightweight items at waist height.     Time  4    Period  Weeks        OT Long Term Goals - 09/11/17 0957      OT LONG TERM GOAL #1   Title  Pt  will return to highest level of functioning during B/IADLs using RUE as dominant.     Time  8    Period  Weeks    Status  Achieved      OT LONG TERM GOAL #2   Title  Pt will decrease pain in RUE to 2/10 or less to improve ability to sleep comfortably.     Time  8    Period  Weeks    Status  Achieved      OT LONG TERM GOAL #3   Title  Pt will decrease RUE fascial restrictions to minimal amounts or less to improve mobility required for functional reaching tasks.     Time  8    Period  Weeks    Status  Achieved      OT LONG TERM GOAL #4   Title  Pt will improve RUE A/ROM to [facility] to improve ability to reach up and wash/comb/fix hair.     Time  8    Period  Weeks    Status  Partially Met      OT LONG TERM GOAL #5   Title  Pt will improve RUE strength to 4/5 or better to improve ability to perform cooking tasks.     Time  8    Period  Weeks    Status  Partially Met            Plan - 09/11/17 1023    Clinical Impression Statement  A: Reassessment completed this session, pt has met all STGs and 3/5 LTGs, has also partially met remaining 2 LTGs. Pt is now completing all ADLs independently, is completing her HEP daily. Primary deficits are overhead reaching and strength, pt has plateaued with ROM and is aware it will most likely not improve any more. Pt was provided with and educated on HEP for RUE strengthening within current range, discussed breaking strengthening and theraband exercises up during the week. Reviewed all exercises on new HEP today, verbal cuing for form initially. Pt is agreeable to discharge.      Plan  P: Discharge pt       Patient will benefit from skilled therapeutic intervention in order to improve the following deficits and impairments:  Decreased activity tolerance, Decreased strength, Impaired flexibility, Decreased range of motion, Pain, Increased fascial restrictions, Impaired UE functional use  Visit Diagnosis: Acute pain of right shoulder  Stiffness  of right shoulder, not elsewhere classified  Other symptoms and signs involving the musculoskeletal system    Problem List Patient Active Problem List   Diagnosis Date Noted  . Uncontrolled type 2 diabetes mellitus with diabetic nephropathy, with long-term current use of insulin ([place])   . UTI due to extended-spectrum beta lactamase (ESBL) producing Escherichia coli   . Pneumothorax 02/05/2017  . Multiple rib fractures 02/05/2017  . Type 2 diabetes mellitus with hypoglycemia without coma ([place]) 02/05/2017  . Clavicle fracture 02/05/2017  . Elevated blood pressure reading 02/05/2017  . UTI (urinary tract infection) 02/05/2017  . Back pain at L4-L5 level 06/14/2013  . Muscle weakness (generalized) 12/17/2012  . Pain in joint, shoulder region 12/17/2012  . Shoulder fracture 12/14/2012  . Proximal humerus fracture 11/29/2012  .  FRACTURE, TOE, RIGHT 09/10/2009   Guadelupe Sabin, OTR/L  [phone] 09/11/2017, 10:27 AM  [facility] [address] [place], [place], [address] Phone: [phone]   Fax:  [phone]  Name: Danielle [name] MRN: [id number] Date of Birth: 09-Jun-1931   OCCUPATIONAL THERAPY DISCHARGE SUMMARY  Visits from Start of Care: 16  Current functional level related to goals / functional outcomes: See above. Pt is independent in B/IADLs and without pain.    Remaining deficits: ROM for overhead reaching, strength for lifting weighted items   Education / Equipment: Red theraband strengthening Plan: Patient agrees to discharge.  Patient goals were met. Patient is being discharged due to meeting the stated rehab goals.  ?????

## 2017-10-29 ENCOUNTER — Encounter (INDEPENDENT_AMBULATORY_CARE_PROVIDER_SITE_OTHER): Payer: Self-pay | Admitting: Orthopedic Surgery

## 2017-10-29 ENCOUNTER — Ambulatory Visit (INDEPENDENT_AMBULATORY_CARE_PROVIDER_SITE_OTHER): Payer: Medicare Other | Admitting: Orthopedic Surgery

## 2017-10-29 DIAGNOSIS — Z96611 Presence of right artificial shoulder joint: Secondary | ICD-10-CM | POA: Diagnosis not present

## 2017-10-31 ENCOUNTER — Encounter (INDEPENDENT_AMBULATORY_CARE_PROVIDER_SITE_OTHER): Payer: Self-pay | Admitting: Orthopedic Surgery

## 2017-10-31 NOTE — Progress Notes (Signed)
Office Visit Note   Patient: Danielle [name]           Date of Birth: Oct 07, 1931           MRN: [id number] Visit Date: 10/29/2017 Requested by: Danielle [name], [address] [place], [place] [address] PCP: Danielle Du, MD  Subjective: Chief Complaint  Patient presents with  . Right Shoulder - Follow-up    HPI: Danielle [name] is a patient who is now about 5 months out reverse shoulder replacement on the right for fracture.  Finished therapy 3 weeks ago.  Does not have any pain.  Hemoglobin A1c is 10.  States she is functional with the right arm.              ROS: All systems reviewed are negative as they relate to the chief complaint within the history of present illness.  Patient denies  fevers or chills.   Assessment & Plan: Visit Diagnoses:  1. Status post reverse arthroplasty of right shoulder     Plan: Impression is good functional results in a patient with significant osteoporosis following reverse shoulder replacement.  She has at or slightly above 90 degrees of forward flexion and abduction.  I do want her to stay on a 10 pound lifting limit because of the osteoporosis present.  I will see her back as needed  Follow-Up Instructions: Return if symptoms worsen or fail to improve.   Orders:  No orders of the defined types were placed in this encounter.  No orders of the defined types were placed in this encounter.     Procedures: No procedures performed   Clinical Data: No additional findings.  Objective: Vital Signs: There were no vitals taken for this visit.  Physical Exam:   Constitutional: Patient appears well-developed HEENT:  Head: Normocephalic Eyes:EOM are normal Neck: Normal range of motion Cardiovascular: Normal rate Pulmonary/chest: Effort normal Neurologic: Patient is alert Skin: Skin is warm Psychiatric: Patient has normal mood and affect    Ortho Exam: Orthopedic exam demonstrates full active and passive range of  motion of the left shoulder.  On the right-hand side the patient has about 90 degrees of forward flexion and abduction.  She can get her hand to her head.  No other masses lymphadenopathy or skin changes noted in that right shoulder region.  Incision is intact.  Strength is also improving.  Specialty Comments:  No specialty comments available.  Imaging: No results found.   PMFS History: Patient Active Problem List   Diagnosis Date Noted  . Uncontrolled type 2 diabetes mellitus with diabetic nephropathy, with long-term current use of insulin ([place])   . UTI due to extended-spectrum beta lactamase (ESBL) producing Escherichia coli   . Pneumothorax 02/05/2017  . Multiple rib fractures 02/05/2017  . Type 2 diabetes mellitus with hypoglycemia without coma ([place]) 02/05/2017  . Clavicle fracture 02/05/2017  . Elevated blood pressure reading 02/05/2017  . UTI (urinary tract infection) 02/05/2017  . Back pain at L4-L5 level 06/14/2013  . Muscle weakness (generalized) 12/17/2012  . Pain in joint, shoulder region 12/17/2012  . Shoulder fracture 12/14/2012  . Proximal humerus fracture 11/29/2012  . FRACTURE, TOE, RIGHT 09/10/2009   Past Medical History:  Diagnosis Date  . Arthritis    "some joint pain once in awhile" (05/26/2017)  . History of kidney stones   . Hypertension   . Hypothyroidism (acquired)   . Pneumothorax 02/05/2017   right-after fall  . Rib fractures 02/05/2017   right  side  . Type II diabetes mellitus ([place])     History reviewed. No pertinent family history.  Past Surgical History:  Procedure Laterality Date  . AUGMENTATION MAMMAPLASTY Bilateral   . DILATION AND CURETTAGE OF UTERUS    . FRACTURE SURGERY    . REVERSE SHOULDER ARTHROPLASTY Right 05/26/2017  . REVERSE SHOULDER ARTHROPLASTY Right 05/26/2017   Procedure: REVERSE RIGHT SHOULDER ARTHROPLASTY;  Surgeon: Meredith Pel, MD;  Location: [facility];  Service: Orthopedics;  Laterality: Right;  . TONSILLECTOMY    .  TUBAL LIGATION     Social History   Occupational History  . Not on file  Tobacco Use  . Smoking status: Former Smoker    Packs/day: 1.00    Years: 40.00    Pack years: 40.00    Types: Cigarettes    Last attempt to quit: 1991    Years since quitting: 28.4  . Smokeless tobacco: Never Used  Substance and Sexual Activity  . Alcohol use: No  . Drug use: No  . Sexual activity: Not on file

## 2018-03-23 ENCOUNTER — Other Ambulatory Visit (HOSPITAL_COMMUNITY): Payer: Self-pay | Admitting: Pulmonary Disease

## 2018-03-23 DIAGNOSIS — Z1231 Encounter for screening mammogram for malignant neoplasm of breast: Secondary | ICD-10-CM

## 2018-04-07 ENCOUNTER — Ambulatory Visit (HOSPITAL_COMMUNITY)
Admission: RE | Admit: 2018-04-07 | Discharge: 2018-04-07 | Disposition: A | Discharge status: Home / Self Care | Payer: Medicare Other | Source: Ambulatory Visit | Attending: Pulmonary Disease | Admitting: Pulmonary Disease

## 2018-04-07 DIAGNOSIS — Z1231 Encounter for screening mammogram for malignant neoplasm of breast: Secondary | ICD-10-CM | POA: Diagnosis present

## 2018-04-07 LAB — HM MAMMOGRAPHY

## 2018-04-23 ENCOUNTER — Ambulatory Visit (INDEPENDENT_AMBULATORY_CARE_PROVIDER_SITE_OTHER): Payer: Self-pay

## 2018-04-23 ENCOUNTER — Encounter (INDEPENDENT_AMBULATORY_CARE_PROVIDER_SITE_OTHER): Payer: Self-pay | Admitting: Orthopedic Surgery

## 2018-04-23 ENCOUNTER — Ambulatory Visit (INDEPENDENT_AMBULATORY_CARE_PROVIDER_SITE_OTHER): Payer: Medicare Other | Admitting: Orthopedic Surgery

## 2018-04-23 DIAGNOSIS — M25511 Pain in right shoulder: Secondary | ICD-10-CM | POA: Diagnosis not present

## 2018-04-24 ENCOUNTER — Encounter (INDEPENDENT_AMBULATORY_CARE_PROVIDER_SITE_OTHER): Payer: Self-pay | Admitting: Orthopedic Surgery

## 2018-04-24 NOTE — Progress Notes (Signed)
Office Visit Note   Patient: Danielle [name]           Date of Birth: [birth year]           MRN: [id number] Visit Date: 04/23/2018 Requested by: Sinda Du, MD [address] [place],  [address] PCP: Sinda Du, MD  Subjective: Chief Complaint  Patient presents with  . Right Upper Arm - Pain    HPI: Patient presents for evaluation of right shoulder pain.  She is had reverse shoulder replacement in for a year.  History of proximal humerus fracture.  She has sporadic pain which comes and goes.  Localizes to the mid humeral region.  Takes Tylenol if needed.  No fevers and chills.  She is able to put her hand to the top of her head.              ROS: All systems reviewed are negative as they relate to the chief complaint within the history of present illness.  Patient denies  fevers or chills.   Assessment & Plan: Visit Diagnoses:  1. Right shoulder pain, unspecified chronicity     Plan: Impression is well-functioning reverse right shoulder replacement with no complicating features on radiographs and no concerning clinical exam features.  Sporadic pain not unexpected.  Follow-up as needed  Follow-Up Instructions: Return if symptoms worsen or fail to improve.   Orders:  Orders Placed This Encounter  Procedures  . XR Shoulder Right   No orders of the defined types were placed in this encounter.     Procedures: No procedures performed   Clinical Data: No additional findings.  Objective: Vital Signs: There were no vitals taken for this visit.  Physical Exam:   Constitutional: Patient appears well-developed HEENT:  Head: Normocephalic Eyes:EOM are normal Neck: Normal range of motion Cardiovascular: Normal rate Pulmonary/chest: Effort normal Neurologic: Patient is alert Skin: Skin is warm Psychiatric: Patient has normal mood and affect    Ortho Exam: Ortho exam demonstrates full active and passive range of motion of the left arm.  On the  right-hand side she can get forward flexion and abduction both above 90 degrees.  Motor or sensory function to the hand is intact and there is no focal tenderness to palpation in the humeral shaft region.  Specialty Comments:  No specialty comments available.  Imaging: No results found.   PMFS History: Patient Active Problem List   Diagnosis Date Noted  . Uncontrolled type 2 diabetes mellitus with diabetic nephropathy, with long-term current use of insulin ([place])   . UTI due to extended-spectrum beta lactamase (ESBL) producing Escherichia coli   . Pneumothorax 02/05/2017  . Multiple rib fractures 02/05/2017  . Type 2 diabetes mellitus with hypoglycemia without coma ([place]) 02/05/2017  . Clavicle fracture 02/05/2017  . Elevated blood pressure reading 02/05/2017  . UTI (urinary tract infection) 02/05/2017  . Back pain at L4-L5 level 06/14/2013  . Muscle weakness (generalized) 12/17/2012  . Pain in joint, shoulder region 12/17/2012  . Shoulder fracture 12/14/2012  . Proximal humerus fracture 11/29/2012  . FRACTURE, TOE, RIGHT 09/10/2009   Past Medical History:  Diagnosis Date  . Arthritis    "some joint pain once in awhile" (05/26/2017)  . History of kidney stones   . Hypertension   . Hypothyroidism (acquired)   . Pneumothorax 02/05/2017   right-after fall  . Rib fractures 02/05/2017   right side  . Type II diabetes mellitus ([place])     History reviewed. No pertinent family history.  Past Surgical History:  Procedure Laterality Date  . AUGMENTATION MAMMAPLASTY Bilateral   . DILATION AND CURETTAGE OF UTERUS    . FRACTURE SURGERY    . REVERSE SHOULDER ARTHROPLASTY Right 05/26/2017  . REVERSE SHOULDER ARTHROPLASTY Right 05/26/2017   Procedure: REVERSE RIGHT SHOULDER ARTHROPLASTY;  Surgeon: Meredith Pel, MD;  Location: [facility];  Service: Orthopedics;  Laterality: Right;  . TONSILLECTOMY    . TUBAL LIGATION     Social History   Occupational History  . Not on file  Tobacco  Use  . Smoking status: Former Smoker    Packs/day: 1.00    Years: 40.00    Pack years: 40.00    Types: Cigarettes    Last attempt to quit: 1991    Years since quitting: 28.9  . Smokeless tobacco: Never Used  Substance and Sexual Activity  . Alcohol use: No  . Drug use: No  . Sexual activity: Not on file

## 2018-05-03 ENCOUNTER — Encounter (HOSPITAL_COMMUNITY): Payer: Self-pay | Admitting: Emergency Medicine

## 2018-05-03 ENCOUNTER — Other Ambulatory Visit: Payer: Self-pay

## 2018-05-03 ENCOUNTER — Emergency Department (HOSPITAL_COMMUNITY): Payer: Medicare Other

## 2018-05-03 ENCOUNTER — Inpatient Hospital Stay (HOSPITAL_COMMUNITY)
Admission: EM | Admit: 2018-05-03 | Discharge: 2018-05-04 | DRG: 065 | Disposition: A | Discharge status: Home / Self Care | Payer: Medicare Other | Attending: Family Medicine | Admitting: Family Medicine

## 2018-05-03 DIAGNOSIS — Z794 Long term (current) use of insulin: Secondary | ICD-10-CM | POA: Diagnosis not present

## 2018-05-03 DIAGNOSIS — Z87891 Personal history of nicotine dependence: Secondary | ICD-10-CM

## 2018-05-03 DIAGNOSIS — I129 Hypertensive chronic kidney disease with stage 1 through stage 4 chronic kidney disease, or unspecified chronic kidney disease: Secondary | ICD-10-CM | POA: Diagnosis present

## 2018-05-03 DIAGNOSIS — I361 Nonrheumatic tricuspid (valve) insufficiency: Secondary | ICD-10-CM | POA: Diagnosis not present

## 2018-05-03 DIAGNOSIS — Z7982 Long term (current) use of aspirin: Secondary | ICD-10-CM

## 2018-05-03 DIAGNOSIS — E1121 Type 2 diabetes mellitus with diabetic nephropathy: Secondary | ICD-10-CM | POA: Diagnosis not present

## 2018-05-03 DIAGNOSIS — I6310 Cerebral infarction due to embolism of unspecified precerebral artery: Secondary | ICD-10-CM

## 2018-05-03 DIAGNOSIS — I10 Essential (primary) hypertension: Secondary | ICD-10-CM | POA: Diagnosis present

## 2018-05-03 DIAGNOSIS — E039 Hypothyroidism, unspecified: Secondary | ICD-10-CM | POA: Diagnosis present

## 2018-05-03 DIAGNOSIS — R29700 NIHSS score 0: Secondary | ICD-10-CM | POA: Diagnosis present

## 2018-05-03 DIAGNOSIS — I639 Cerebral infarction, unspecified: Secondary | ICD-10-CM | POA: Diagnosis present

## 2018-05-03 DIAGNOSIS — Z888 Allergy status to other drugs, medicaments and biological substances status: Secondary | ICD-10-CM

## 2018-05-03 DIAGNOSIS — I6523 Occlusion and stenosis of bilateral carotid arteries: Secondary | ICD-10-CM | POA: Diagnosis present

## 2018-05-03 DIAGNOSIS — IMO0002 Reserved for concepts with insufficient information to code with codable children: Secondary | ICD-10-CM

## 2018-05-03 DIAGNOSIS — N183 Chronic kidney disease, stage 3 (moderate): Secondary | ICD-10-CM | POA: Diagnosis present

## 2018-05-03 DIAGNOSIS — E1165 Type 2 diabetes mellitus with hyperglycemia: Secondary | ICD-10-CM | POA: Diagnosis not present

## 2018-05-03 DIAGNOSIS — I351 Nonrheumatic aortic (valve) insufficiency: Secondary | ICD-10-CM | POA: Diagnosis not present

## 2018-05-03 DIAGNOSIS — G8194 Hemiplegia, unspecified affecting left nondominant side: Secondary | ICD-10-CM | POA: Diagnosis present

## 2018-05-03 DIAGNOSIS — E1122 Type 2 diabetes mellitus with diabetic chronic kidney disease: Secondary | ICD-10-CM | POA: Diagnosis present

## 2018-05-03 DIAGNOSIS — E11649 Type 2 diabetes mellitus with hypoglycemia without coma: Secondary | ICD-10-CM | POA: Diagnosis present

## 2018-05-03 DIAGNOSIS — Z79899 Other long term (current) drug therapy: Secondary | ICD-10-CM

## 2018-05-03 DIAGNOSIS — Z96611 Presence of right artificial shoulder joint: Secondary | ICD-10-CM | POA: Diagnosis present

## 2018-05-03 DIAGNOSIS — I63511 Cerebral infarction due to unspecified occlusion or stenosis of right middle cerebral artery: Secondary | ICD-10-CM | POA: Diagnosis present

## 2018-05-03 LAB — URINALYSIS, ROUTINE W REFLEX MICROSCOPIC
Bilirubin Urine: NEGATIVE
Glucose, UA: >=500 mg/dL — AB
Hgb urine dipstick: NEGATIVE
KETONES UR: NEGATIVE mg/dL
Nitrite: NEGATIVE
Protein, ur: NEGATIVE mg/dL
Specific Gravity, Urine: 1.032 — ABNORMAL HIGH (ref 1.005–1.030)
pH: 5 (ref 5.0–8.0)

## 2018-05-03 LAB — DIFFERENTIAL
Abs Immature Granulocytes: 0.01 10*3/uL (ref 0.00–0.07)
Basophils Absolute: 0.1 10*3/uL (ref 0.0–0.1)
Basophils Relative: 1 %
Eosinophils Absolute: 0.1 10*3/uL (ref 0.0–0.5)
Eosinophils Relative: 2 %
IMMATURE GRANULOCYTES: 0 %
LYMPHS ABS: 2 10*3/uL (ref 0.7–4.0)
LYMPHS PCT: 27 %
Monocytes Absolute: 0.7 10*3/uL (ref 0.1–1.0)
Monocytes Relative: 10 %
Neutro Abs: 4.5 10*3/uL (ref 1.7–7.7)
Neutrophils Relative %: 60 %

## 2018-05-03 LAB — CBC
HEMATOCRIT: 40.2 % (ref 36.0–46.0)
HEMOGLOBIN: 13.1 g/dL (ref 12.0–15.0)
MCH: 30.2 pg (ref 26.0–34.0)
MCHC: 32.6 g/dL (ref 30.0–36.0)
MCV: 92.6 fL (ref 80.0–100.0)
NRBC: 0 % (ref 0.0–0.2)
Platelets: 252 10*3/uL (ref 150–400)
RBC: 4.34 MIL/uL (ref 3.87–5.11)
RDW: 14.2 % (ref 11.5–15.5)
WBC: 7.5 10*3/uL (ref 4.0–10.5)

## 2018-05-03 LAB — COMPREHENSIVE METABOLIC PANEL
ALT: 16 U/L (ref 0–44)
AST: 22 U/L (ref 15–41)
Albumin: 3.6 g/dL (ref 3.5–5.0)
Alkaline Phosphatase: 62 U/L (ref 38–126)
Anion gap: 8 (ref 5–15)
BUN: 22 mg/dL (ref 8–23)
CO2: 23 mmol/L (ref 22–32)
Calcium: 9 mg/dL (ref 8.9–10.3)
Chloride: 103 mmol/L (ref 98–111)
Creatinine, Ser: 0.94 mg/dL (ref 0.44–1.00)
GFR calc Af Amer: >60 mL/min (ref >60)
GFR, EST NON AFRICAN AMERICAN: 56 mL/min — AB (ref >60)
Glucose, Bld: 401 mg/dL — ABNORMAL HIGH (ref 70–99)
Potassium: 3.8 mmol/L (ref 3.5–5.1)
Sodium: 134 mmol/L — ABNORMAL LOW (ref 135–145)
Total Bilirubin: 0.7 mg/dL (ref 0.3–1.2)
Total Protein: 5.9 g/dL — ABNORMAL LOW (ref 6.5–8.1)

## 2018-05-03 LAB — PROTIME-INR
INR: 0.87
PROTHROMBIN TIME: 11.8 s (ref 11.4–15.2)

## 2018-05-03 LAB — TROPONIN I: Troponin I: <0.03 ng/mL (ref <0.03)

## 2018-05-03 LAB — APTT: aPTT: 27 seconds (ref 24–36)

## 2018-05-03 LAB — I-STAT TROPONIN, ED: Troponin i, poc: 0 ng/mL (ref 0.00–0.08)

## 2018-05-03 LAB — GLUCOSE, CAPILLARY
Glucose-Capillary: 401 mg/dL — ABNORMAL HIGH (ref 70–99)
Glucose-Capillary: 268 mg/dL — ABNORMAL HIGH (ref 70–99)

## 2018-05-03 MED ORDER — INSULIN ASPART 100 UNIT/ML ~~LOC~~ SOLN
0.0000 [IU] | Freq: Every day | SUBCUTANEOUS | Status: DC
  Administered 2018-05-03: 3 [IU] via SUBCUTANEOUS

## 2018-05-03 MED ORDER — ACETAMINOPHEN 160 MG/5ML PO SOLN: 650.0000 mg | ORAL | Status: DC | PRN

## 2018-05-03 MED ORDER — ENOXAPARIN SODIUM 40 MG/0.4ML ~~LOC~~ SOLN
40.0000 mg | SUBCUTANEOUS | Status: DC
  Administered 2018-05-03: 40 mg via SUBCUTANEOUS
  Filled 2018-05-03: qty 0.4

## 2018-05-03 MED ORDER — INSULIN ASPART 100 UNIT/ML ~~LOC~~ SOLN
0.0000 [IU] | Freq: Three times a day (TID) | SUBCUTANEOUS | Status: DC
  Administered 2018-05-04: 7 [IU] via SUBCUTANEOUS
  Administered 2018-05-04: 2 [IU] via SUBCUTANEOUS

## 2018-05-03 MED ORDER — GADOBUTROL 1 MMOL/ML IV SOLN
5.0000 mL | Freq: Once | INTRAVENOUS | Status: AC | PRN
  Administered 2018-05-03: 5 mL via INTRAVENOUS

## 2018-05-03 MED ORDER — ACETAMINOPHEN 325 MG PO TABS: 650.0000 mg | ORAL_TABLET | ORAL | Status: DC | PRN

## 2018-05-03 MED ORDER — ACETAMINOPHEN 650 MG RE SUPP: 650.0000 mg | RECTAL | Status: DC | PRN

## 2018-05-03 MED ORDER — STROKE: EARLY STAGES OF RECOVERY BOOK
Freq: Once | Status: DC
  Filled 2018-05-03: qty 1

## 2018-05-03 MED ORDER — LEVOTHYROXINE SODIUM 88 MCG PO TABS
88.0000 ug | ORAL_TABLET | Freq: Every day | ORAL | Status: DC
  Administered 2018-05-04: 88 ug via ORAL
  Filled 2018-05-03: qty 1

## 2018-05-03 MED ORDER — INSULIN GLARGINE 100 UNIT/ML ~~LOC~~ SOLN
20.0000 [IU] | Freq: Every day | SUBCUTANEOUS | Status: DC
  Administered 2018-05-04: 20 [IU] via SUBCUTANEOUS
  Filled 2018-05-03 (×2): qty 0.2

## 2018-05-03 NOTE — Plan of Care (Signed)
Patient acclimated to floor and unit policies. Reviewed all care orders with patient and family. Patient non-compliant in diet order. Patient ordered to be NPO due to failing swallow screen. Patient had husband and family bring her food from outside and states she was going to eat anyway. Discussed risks with patient who states "I'm going to eat my food from [employer], I will not have anything else until tomorrow morning. Oncoming nursing shift aware.

## 2018-05-03 NOTE — ED Provider Notes (Signed)
Emergency Department Provider Note   I have reviewed the triage vital signs and the nursing notes.   HISTORY  Chief Complaint Weakness   HPI Danielle [name] is a 84 y.o. female with PMH of HTN, DM, and prior rib fractures presents to the emergency department for evaluation of acute onset gait instability and left-sided weakness.  Symptoms began 3 days prior to ED evaluation.  Patient states that she mainly had difficulty with walking.  She denies any vision changes, difficulty swallowing/choking, or speech changes.  Denies any chest pain, heart palpitations.  No history of prior stroke.  When symptoms did not resolve over the last 2 days she decided to present to the emergency department.  No radiation of symptoms or other modifying factors.   Past Medical History:  Diagnosis Date  . Arthritis    "some joint pain once in awhile" (05/26/2017)  . History of kidney stones   . Hypertension   . Hypothyroidism (acquired)   . Pneumothorax 02/05/2017   right-after fall  . Rib fractures 02/05/2017   right side  . Type II diabetes mellitus [facility]     Patient Active Problem List   Diagnosis Date Noted  . Acute CVA (cerebrovascular accident) ([place]) 05/03/2018  . Hypothyroidism (acquired) 05/03/2018  . Hypertension 05/03/2018  . Uncontrolled type 2 diabetes mellitus with diabetic nephropathy, with Sharol Croghan-term current use of insulin ([place])   . UTI due to extended-spectrum beta lactamase (ESBL) producing Escherichia coli   . Pneumothorax 02/05/2017  . Multiple rib fractures 02/05/2017  . Type 2 diabetes mellitus with hypoglycemia without coma ([place]) 02/05/2017  . Clavicle fracture 02/05/2017  . Elevated blood pressure reading 02/05/2017  . UTI (urinary tract infection) 02/05/2017  . Back pain at L4-L5 level 06/14/2013  . Muscle weakness (generalized) 12/17/2012  . Pain in joint, shoulder region 12/17/2012  . Shoulder fracture 12/14/2012  . Proximal humerus fracture 11/29/2012  .  FRACTURE, TOE, RIGHT 09/10/2009    Past Surgical History:  Procedure Laterality Date  . AUGMENTATION MAMMAPLASTY Bilateral   . DILATION AND CURETTAGE OF UTERUS    . FRACTURE SURGERY    . REVERSE SHOULDER ARTHROPLASTY Right 05/26/2017  . REVERSE SHOULDER ARTHROPLASTY Right 05/26/2017   Procedure: REVERSE RIGHT SHOULDER ARTHROPLASTY;  Surgeon: Meredith Pel, MD;  Location: [facility];  Service: Orthopedics;  Laterality: Right;  . TONSILLECTOMY    . TUBAL LIGATION      Allergies Fosamax [alendronate sodium] and Prednisone  History reviewed. No pertinent family history.  Social History Social History   Tobacco Use  . Smoking status: Former Smoker    Packs/day: 1.00    Years: 40.00    Pack years: 40.00    Types: Cigarettes    Last attempt to quit: 1991    Years since quitting: 28.9  . Smokeless tobacco: Never Used  Substance Use Topics  . Alcohol use: No  . Drug use: No    Review of Systems  Constitutional: No fever/chills Eyes: No visual changes. ENT: No sore throat. Cardiovascular: Denies chest pain. Respiratory: Denies shortness of breath. Gastrointestinal: No abdominal pain.  No nausea, no vomiting.  No diarrhea.  No constipation. Genitourinary: Negative for dysuria. Musculoskeletal: Negative for back pain. Skin: Negative for rash. Neurological: Negative for headaches, or numbness. Mild left side weakness and gait instability.   10-point ROS otherwise negative.  ____________________________________________   PHYSICAL EXAM:  VITAL SIGNS: ED Triage Vitals  Enc Vitals Group     BP 05/03/18 1223 (!) 159/56  Pulse Rate 05/03/18 1223 65     Resp 05/03/18 1223 18     Temp 05/03/18 1223 97.7 F (36.5 C)     Temp Source 05/03/18 1223 Oral     SpO2 05/03/18 1223 97 %     Weight 05/03/18 1223 123 lb (55.8 kg)     Height 05/03/18 1223 5\' 5"  (1.651 m)     Pain Score 05/03/18 1216 0   Constitutional: Alert and oriented. Well appearing and in no acute  distress. Eyes: Conjunctivae are normal. PERRL. EOMI. Head: Atraumatic. Nose: No congestion/rhinnorhea. Mouth/Throat: Mucous membranes are moist.  Neck: No stridor.  Cardiovascular: Normal rate, regular rhythm. Good peripheral circulation. Grossly normal heart sounds.   Respiratory: Normal respiratory effort.  No retractions. Lungs CTAB. Gastrointestinal: Soft and nontender. No distention.  Musculoskeletal: No lower extremity tenderness nor edema. No gross deformities of extremities. Neurologic:  Normal speech and language. Normal CN exam 2-12. Mild left pronator drift. Normal sensation in the upper and lower extremities. No LE weakness appreciated.  Skin:  Skin is warm, dry and intact. No rash noted.  ____________________________________________   LABS (all labs ordered are listed, but only abnormal results are displayed)  Labs Reviewed  COMPREHENSIVE METABOLIC PANEL - Abnormal; Notable for the following components:      Result Value   Sodium 134 (*)    Glucose, Bld 401 (*)    Total Protein 5.9 (*)    GFR calc non Af Amer 56 (*)    All other components within normal limits  URINALYSIS, ROUTINE W REFLEX MICROSCOPIC - Abnormal; Notable for the following components:   Specific Gravity, Urine 1.032 (*)    Glucose, UA >=500 (*)    Leukocytes, UA TRACE (*)    Bacteria, UA RARE (*)    All other components within normal limits  GLUCOSE, CAPILLARY - Abnormal; Notable for the following components:   Glucose-Capillary 401 (*)    All other components within normal limits  PROTIME-INR  APTT  CBC  DIFFERENTIAL  TROPONIN I  HEMOGLOBIN A1C  LIPID PANEL  TSH  BASIC METABOLIC PANEL  CBG MONITORING, ED  I-STAT TROPONIN, ED   ____________________________________________  EKG   EKG Interpretation  Date/Time:  Monday May 03 2018 12:21:52 EST Ventricular Rate:  74 PR Interval:  214 QRS Duration: 128 QT Interval:  450 QTC Calculation: 499 R Axis:   -73 Text Interpretation:   Sinus rhythm with 1st degree A-V block Possible Left atrial enlargement Left axis deviation Non-specific intra-ventricular conduction block Possible Anterolateral infarct , age undetermined Abnormal ECG No STEMI.  Confirmed by Nanda Quinton [phone]) on 05/03/2018 2:28:07 PM       ____________________________________________  RADIOLOGY  Ct Head Wo Contrast  Result Date: 05/03/2018 CLINICAL DATA:  84 year old female with left-sided weakness, tremors and short-term memory loss for 2-3 days. Initial encounter. EXAM: CT HEAD WITHOUT CONTRAST TECHNIQUE: Contiguous axial images were obtained from the base of the skull through the vertex without intravenous contrast. COMPARISON:  02/05/2017 head CT. FINDINGS: Brain: Acute/subacute moderate size infarct right frontal lobe/anterior right operculum region also involving the right caudate head and anterior aspect of the anterior limb of the right internal capsule. No associated hemorrhage. Local mass effect upon the right lateral ventricle. Chronic microvascular changes. Mild global atrophy. No intracranial mass lesion noted on this unenhanced exam. Vascular: Hyperdense right middle cerebral artery branch vessels consistent with acute infarct. Cavernous segment carotid artery calcified plaque. Skull: No acute abnormality. Sinuses/Orbits: Post lens replacement. No acute orbital  abnormality. Visualized paranasal sinuses clear. Other: Mastoid air cells and middle ear cavities are clear. IMPRESSION: 1. Acute/subacute moderate size infarct right frontal lobe/anterior right operculum region also involving the right caudate head and anterior aspect of the anterior limb of the right internal capsule. No associated hemorrhage. Mild local mass effect upon the right lateral ventricle. Minimal bowing of the septum to the left without significant change/midline shift 2. Hyperdense right middle cerebral artery branch vessels consistent with acute infarct. 3. Chronic microvascular  changes. Mild global atrophy. These results will be called to the ordering clinician or representative by the Radiologist Assistant, and communication documented in the PACS or zVision Dashboard. Electronically Signed   By: Genia Del M.D.   On: 05/03/2018 13:15   Mr Jodene Nam Head Wo Contrast  Result Date: 05/03/2018 CLINICAL DATA:  Left-sided weakness, tremors, and memory loss for 2-3 days. EXAM: MRI HEAD WITHOUT AND WITH CONTRAST MRA HEAD WITHOUT CONTRAST MRA NECK WITHOUT AND WITH CONTRAST TECHNIQUE: Multiplanar, multiecho pulse sequences of the brain and surrounding structures were obtained without and with intravenous contrast. Angiographic images of the Circle of Willis were obtained using MRA technique without intravenous contrast. Angiographic images of the neck were obtained using MRA technique without and with intravenous contrast. Carotid stenosis measurements (when applicable) are obtained utilizing NASCET criteria, using the distal internal carotid diameter as the denominator. CONTRAST:  5 mL Gadavist COMPARISON:  Head CT 05/03/2018 FINDINGS: MRI HEAD FINDINGS Brain: There is a moderate-sized acute to early subacute right MCA territory infarct involving the frontal operculum, insula, and basal ganglia. A few punctate foci of diffusion abnormality and enhancement in the right parietal lobe are compatible with early subacute infarcts. There is a small chronic infarct involving the white matter of the posterior right frontal lobe. Scattered T2 hyperintensities elsewhere in the cerebral white matter bilaterally are nonspecific but compatible with mild chronic small vessel ischemic disease. Mild cerebral atrophy is within normal limits for age. An avidly enhancing extra-axial mass along the falx in the anterior left frontal region measures 25 x 27 x 36 mm (transverse x AP x craniocaudal) with a small amount of cystic change centrally. There is local mass effect on the left frontal lobe without edema. No  intracranial hemorrhage or extra-axial fluid collection is present. Vascular: Abnormal appearance of the distal right ICA, more fully evaluated below. Skull and upper cervical spine: Unremarkable bone marrow signal. Sinuses/Orbits: Bilateral cataract extraction. Paranasal sinuses and mastoid air cells are clear. Other: None. MRA HEAD FINDINGS The visualized distal vertebral arteries are patent to the basilar with the left being dominant. The basilar artery is widely patent. There is a large left posterior communicating artery with mildly hypoplastic left P1 segment. Moderate to severe right and severe left P2 stenoses are noted. The left internal carotid artery is patent from skull base to carotid terminus with at most mild stenosis at the level of the anterior genu. The right ICA is occluded proximally with reconstitution of the terminus. There is evidence of poor flow in the right MCA with near complete signal loss in the distal M1 and proximal M2 segments although thready enhancement is present in these regions on the postcontrast neck MRA. The right ACA is patent with evidence of less pronounced reduced flow proximally compared to the MCA. The left ACA and left MCA are patent with mild branch vessel atherosclerotic changes but no evidence of significant proximal stenosis. No aneurysm is identified. MRA NECK FINDINGS There is a standard 3 vessel aortic arch.  The brachiocephalic and subclavian arteries are widely patent. The common carotid arteries are patent without evidence of significant stenosis. There is a severe, nearly occlusive stenosis involving the right ICA origin over a length of 5 mm. The right ICA distal to this is patent but small with reduced flow and progressive tapering, and the right ICA occludes completely at the skull base with distal intracranial reconstitution. The right external carotid artery origin is mildly narrowed. There is 50% stenosis of the proximal left ICA, and there is also mild  narrowing of the left ECA origin. The vertebral arteries are patent with antegrade flow bilaterally. The left vertebral artery is slightly dominant. There is mild stenosis versus artifact in both proximal V1 segments with wide patency of the vertebral arteries more distally. IMPRESSION: 1. Moderate-sized acute to early subacute right MCA infarct. 2. Punctate early subacute right parietal infarcts. 3. Mild chronic small vessel ischemic disease. 4. 3.6 cm left anterior parafalcine meningioma without edema. 5. Severe, nearly completely occlusive stenosis of the right ICA origin with diminished distal flow, complete occlusion at the skull base, and distal intracranial reconstitution. 6. Poor flow in the right MCA. 7. 50% proximal left ICA stenosis. 8. Severe bilateral P2 stenoses. Electronically Signed   By: Logan Bores M.D.   On: 05/03/2018 16:22   Mr Angiogram Neck W Or Wo Contrast  Result Date: 05/03/2018 CLINICAL DATA:  Left-sided weakness, tremors, and memory loss for 2-3 days. EXAM: MRI HEAD WITHOUT AND WITH CONTRAST MRA HEAD WITHOUT CONTRAST MRA NECK WITHOUT AND WITH CONTRAST TECHNIQUE: Multiplanar, multiecho pulse sequences of the brain and surrounding structures were obtained without and with intravenous contrast. Angiographic images of the Circle of Willis were obtained using MRA technique without intravenous contrast. Angiographic images of the neck were obtained using MRA technique without and with intravenous contrast. Carotid stenosis measurements (when applicable) are obtained utilizing NASCET criteria, using the distal internal carotid diameter as the denominator. CONTRAST:  5 mL Gadavist COMPARISON:  Head CT 05/03/2018 FINDINGS: MRI HEAD FINDINGS Brain: There is a moderate-sized acute to early subacute right MCA territory infarct involving the frontal operculum, insula, and basal ganglia. A few punctate foci of diffusion abnormality and enhancement in the right parietal lobe are compatible with  early subacute infarcts. There is a small chronic infarct involving the white matter of the posterior right frontal lobe. Scattered T2 hyperintensities elsewhere in the cerebral white matter bilaterally are nonspecific but compatible with mild chronic small vessel ischemic disease. Mild cerebral atrophy is within normal limits for age. An avidly enhancing extra-axial mass along the falx in the anterior left frontal region measures 25 x 27 x 36 mm (transverse x AP x craniocaudal) with a small amount of cystic change centrally. There is local mass effect on the left frontal lobe without edema. No intracranial hemorrhage or extra-axial fluid collection is present. Vascular: Abnormal appearance of the distal right ICA, more fully evaluated below. Skull and upper cervical spine: Unremarkable bone marrow signal. Sinuses/Orbits: Bilateral cataract extraction. Paranasal sinuses and mastoid air cells are clear. Other: None. MRA HEAD FINDINGS The visualized distal vertebral arteries are patent to the basilar with the left being dominant. The basilar artery is widely patent. There is a large left posterior communicating artery with mildly hypoplastic left P1 segment. Moderate to severe right and severe left P2 stenoses are noted. The left internal carotid artery is patent from skull base to carotid terminus with at most mild stenosis at the level of the anterior genu. The right  ICA is occluded proximally with reconstitution of the terminus. There is evidence of poor flow in the right MCA with near complete signal loss in the distal M1 and proximal M2 segments although thready enhancement is present in these regions on the postcontrast neck MRA. The right ACA is patent with evidence of less pronounced reduced flow proximally compared to the MCA. The left ACA and left MCA are patent with mild branch vessel atherosclerotic changes but no evidence of significant proximal stenosis. No aneurysm is identified. MRA NECK FINDINGS There  is a standard 3 vessel aortic arch. The brachiocephalic and subclavian arteries are widely patent. The common carotid arteries are patent without evidence of significant stenosis. There is a severe, nearly occlusive stenosis involving the right ICA origin over a length of 5 mm. The right ICA distal to this is patent but small with reduced flow and progressive tapering, and the right ICA occludes completely at the skull base with distal intracranial reconstitution. The right external carotid artery origin is mildly narrowed. There is 50% stenosis of the proximal left ICA, and there is also mild narrowing of the left ECA origin. The vertebral arteries are patent with antegrade flow bilaterally. The left vertebral artery is slightly dominant. There is mild stenosis versus artifact in both proximal V1 segments with wide patency of the vertebral arteries more distally. IMPRESSION: 1. Moderate-sized acute to early subacute right MCA infarct. 2. Punctate early subacute right parietal infarcts. 3. Mild chronic small vessel ischemic disease. 4. 3.6 cm left anterior parafalcine meningioma without edema. 5. Severe, nearly completely occlusive stenosis of the right ICA origin with diminished distal flow, complete occlusion at the skull base, and distal intracranial reconstitution. 6. Poor flow in the right MCA. 7. 50% proximal left ICA stenosis. 8. Severe bilateral P2 stenoses. Electronically Signed   By: Logan Bores M.D.   On: 05/03/2018 16:22   Mr Jeri Cos IE Contrast  Result Date: 05/03/2018 CLINICAL DATA:  Left-sided weakness, tremors, and memory loss for 2-3 days. EXAM: MRI HEAD WITHOUT AND WITH CONTRAST MRA HEAD WITHOUT CONTRAST MRA NECK WITHOUT AND WITH CONTRAST TECHNIQUE: Multiplanar, multiecho pulse sequences of the brain and surrounding structures were obtained without and with intravenous contrast. Angiographic images of the Circle of Willis were obtained using MRA technique without intravenous contrast.  Angiographic images of the neck were obtained using MRA technique without and with intravenous contrast. Carotid stenosis measurements (when applicable) are obtained utilizing NASCET criteria, using the distal internal carotid diameter as the denominator. CONTRAST:  5 mL Gadavist COMPARISON:  Head CT 05/03/2018 FINDINGS: MRI HEAD FINDINGS Brain: There is a moderate-sized acute to early subacute right MCA territory infarct involving the frontal operculum, insula, and basal ganglia. A few punctate foci of diffusion abnormality and enhancement in the right parietal lobe are compatible with early subacute infarcts. There is a small chronic infarct involving the white matter of the posterior right frontal lobe. Scattered T2 hyperintensities elsewhere in the cerebral white matter bilaterally are nonspecific but compatible with mild chronic small vessel ischemic disease. Mild cerebral atrophy is within normal limits for age. An avidly enhancing extra-axial mass along the falx in the anterior left frontal region measures 25 x 27 x 36 mm (transverse x AP x craniocaudal) with a small amount of cystic change centrally. There is local mass effect on the left frontal lobe without edema. No intracranial hemorrhage or extra-axial fluid collection is present. Vascular: Abnormal appearance of the distal right ICA, more fully evaluated below. Skull and upper  cervical spine: Unremarkable bone marrow signal. Sinuses/Orbits: Bilateral cataract extraction. Paranasal sinuses and mastoid air cells are clear. Other: None. MRA HEAD FINDINGS The visualized distal vertebral arteries are patent to the basilar with the left being dominant. The basilar artery is widely patent. There is a large left posterior communicating artery with mildly hypoplastic left P1 segment. Moderate to severe right and severe left P2 stenoses are noted. The left internal carotid artery is patent from skull base to carotid terminus with at most mild stenosis at the  level of the anterior genu. The right ICA is occluded proximally with reconstitution of the terminus. There is evidence of poor flow in the right MCA with near complete signal loss in the distal M1 and proximal M2 segments although thready enhancement is present in these regions on the postcontrast neck MRA. The right ACA is patent with evidence of less pronounced reduced flow proximally compared to the MCA. The left ACA and left MCA are patent with mild branch vessel atherosclerotic changes but no evidence of significant proximal stenosis. No aneurysm is identified. MRA NECK FINDINGS There is a standard 3 vessel aortic arch. The brachiocephalic and subclavian arteries are widely patent. The common carotid arteries are patent without evidence of significant stenosis. There is a severe, nearly occlusive stenosis involving the right ICA origin over a length of 5 mm. The right ICA distal to this is patent but small with reduced flow and progressive tapering, and the right ICA occludes completely at the skull base with distal intracranial reconstitution. The right external carotid artery origin is mildly narrowed. There is 50% stenosis of the proximal left ICA, and there is also mild narrowing of the left ECA origin. The vertebral arteries are patent with antegrade flow bilaterally. The left vertebral artery is slightly dominant. There is mild stenosis versus artifact in both proximal V1 segments with wide patency of the vertebral arteries more distally. IMPRESSION: 1. Moderate-sized acute to early subacute right MCA infarct. 2. Punctate early subacute right parietal infarcts. 3. Mild chronic small vessel ischemic disease. 4. 3.6 cm left anterior parafalcine meningioma without edema. 5. Severe, nearly completely occlusive stenosis of the right ICA origin with diminished distal flow, complete occlusion at the skull base, and distal intracranial reconstitution. 6. Poor flow in the right MCA. 7. 50% proximal left ICA  stenosis. 8. Severe bilateral P2 stenoses. Electronically Signed   By: Logan Bores M.D.   On: 05/03/2018 16:22    ____________________________________________   PROCEDURES  Procedure(s) performed:   Procedures  None ____________________________________________   INITIAL IMPRESSION / ASSESSMENT AND PLAN / ED COURSE  Pertinent labs & imaging results that were available during my care of the patient were reviewed by me and considered in my medical decision making (see chart for details).  Patient presents to the emergency department for evaluation of strokelike symptoms which began 3 days ago.  She has mild left arm weakness and subjective imbalance with walking.  She is failing a swallow screen here.  CT scan from triage shows subacute infarct on the right frontal lobe and caudate.  Discussed the case with Dr. Merlene Laughter.  Patient is outside the window for any intervention at this time. Discussed possible intervascular call on CT. Agrees with plan for MRI/MRA here and he can follow along with the hospitalist team during her admission.   Labs reviewed. Plan for admit to the hosptialist. MRI/MRA pending.   Discussed patient's case with Hospitalist, Dr. Bonner Puna to request admission. Patient and family (if present) updated  with plan. Care transferred to Hospitalist service.  I reviewed all nursing notes, vitals, pertinent old records, EKGs, labs, imaging (as available).  ____________________________________________  FINAL CLINICAL IMPRESSION(S) / ED DIAGNOSES  Final diagnoses:  Cerebrovascular accident (CVA) due to embolism of precerebral artery ([place])     MEDICATIONS GIVEN DURING THIS VISIT:  Medications  insulin glargine (LANTUS) injection 20 Units (has no administration in time range)  levothyroxine (SYNTHROID, LEVOTHROID) tablet 88 mcg (has no administration in time range)   stroke: mapping our early stages of recovery book (has no administration in time range)  acetaminophen  (TYLENOL) tablet 650 mg (has no administration in time range)    Or  acetaminophen (TYLENOL) solution 650 mg (has no administration in time range)    Or  acetaminophen (TYLENOL) suppository 650 mg (has no administration in time range)  enoxaparin (LOVENOX) injection 40 mg (has no administration in time range)  insulin aspart (novoLOG) injection 0-9 Units (has no administration in time range)  insulin aspart (novoLOG) injection 0-5 Units (has no administration in time range)  gadobutrol (GADAVIST) 1 MMOL/ML injection 5 mL (5 mLs Intravenous Contrast Given 05/03/18 1456)    Note:  This document was prepared using Dragon voice recognition software and may include unintentional dictation errors.  Nanda Quinton, MD Emergency Medicine    Nitika Jackowski, Wonda Olds, MD 05/03/18 2018

## 2018-05-03 NOTE — ED Triage Notes (Signed)
Husband reports LT sided weakness, tremors, short term memory loss x 2-3 days. Pt AOx4 at this time.

## 2018-05-03 NOTE — H&P (Signed)
History and Physical   Danielle [name] VPX:[id number] DOB: Oct 06, 1931 DOA: 05/03/2018  Referring MD/NP/PA: Dr. Laverta Baltimore, EDP PCP: Dr. Reynaldo Minium Outpatient Specialists: Orthopedics, Dr. Marlou Sa;   Patient coming from: Home  Chief Complaint: Left sided weakness, imbalance  HPI: Danielle [name] is an 84 year old female with a history of IDT2DM and hypothyroidism who was brought to the ED on her husband's insistence due to 3 days of left arm weakness, confusion, and difficulty walking.   She noticed the gradual onset of left arm weakness that was mild but constant and unchanged through today, no aggravating or alleviating factors. Her husband has also noticed that she's slightly confused compared to her normal baseline and he has had to help her to walk due to imbalance since that time. They were going to go to [facility] tomorrow but when symptoms persisted they presented for evaluation.   ED Course: Left arm pronator drift noted, though patient is oriented. Labs unremarkable with the exception of glucose of 401. [place] and bicarb normal, no ketonuria.  CT demonstrated moderate acute/subacute infarct of the right frontal lobe, anterior limb of internal capsule, and right head of the caudate as well as right MCA hyperdensities consistent with acute infarct on a background of atrophy and chronic microvascular changes. Neurology was consulted and hospitalists asked to admit.  Review of Systems: She's been eating/drinking fine, no speech changes, trouble swallowing, taking all medications as usual. No vision changes, vertigo, chest pain, dyspnea, palpitations, lightheadedness, abdominal pain, nausea, vomiting, diarrhea, rashes, myalgias, arthralgias, leg swelling, or fever, and per HPI. All others reviewed and are negative.   Past Medical History:  Diagnosis Date  . Arthritis    "some joint pain once in awhile" (05/26/2017)  . History of kidney stones   . Hypertension   . Hypothyroidism (acquired)   .  Pneumothorax 02/05/2017   right-after fall  . Rib fractures 02/05/2017   right side  . Type II diabetes mellitus ([place])    Past Surgical History:  Procedure Laterality Date  . AUGMENTATION MAMMAPLASTY Bilateral   . DILATION AND CURETTAGE OF UTERUS    . FRACTURE SURGERY    . REVERSE SHOULDER ARTHROPLASTY Right 05/26/2017  . REVERSE SHOULDER ARTHROPLASTY Right 05/26/2017   Procedure: REVERSE RIGHT SHOULDER ARTHROPLASTY;  Surgeon: Meredith Pel, MD;  Location: [facility];  Service: Orthopedics;  Laterality: Right;  . TONSILLECTOMY    . TUBAL LIGATION     - Does have a long term history of cigarette smoking, but quit in the 1990's. No EtOH or other drugs. Lives with husband of over 47 years.   Allergies  Allergen Reactions  . Fosamax [Alendronate Sodium] Other (See Comments)    MUSCLE ACHES  . Prednisone     Feels jittery and nauseous    - Family history otherwise reviewed and not pertinent.  Prior to Admission medications   Medication Sig Start Date End Date Taking? Authorizing Provider  aspirin EC 81 MG tablet Take 81 mg by mouth daily.   Yes [provider]  Calcium Carb-Cholecalciferol (CALCIUM 600 + D PO) Take 1 tablet by mouth daily.   Yes [provider]  Insulin Glargine (TOUJEO MAX SOLOSTAR) 300 UNIT/ML SOPN Inject 22 Units into the skin daily. Patient taking differently: Inject 18-22 Units into the skin daily. PER SLIDING SCALE 02/08/17  Yes Allie Bossier, MD  insulin lispro (HUMALOG) 100 UNIT/ML injection Inject 4 Units into the skin 2 (two) times daily.    Yes [provider]  levothyroxine (SYNTHROID, LEVOTHROID) 88 MCG tablet Take 88 mcg by mouth daily before breakfast.   Yes [provider]  Multiple Vitamin (MULTIVITAMIN WITH MINERALS) TABS Take 1 tablet by mouth daily.    Yes [provider]  multivitamin-lutein (OCUVITE-LUTEIN) CAPS capsule Take 1 capsule by mouth daily.   Yes [provider]  Omega-3 Fatty Acids  (FISH OIL) 1000 MG CAPS Take 1,000 mg by mouth daily.   Yes [provider]  vitamin B-12 (CYANOCOBALAMIN) 1000 MCG tablet Take 1,000 mcg by mouth daily.   Yes [provider]    Physical Exam: Vitals:   05/03/18 1223 05/03/18 1420  BP: (!) 159/56   Pulse: 65 67  Resp: 18   Temp: 97.7 F (36.5 C)   TempSrc: Oral   SpO2: 97% 97%  Weight: 55.8 kg   Height: 5\' 5"  (1.651 m)    Constitutional: 84 y.o. female in no distress, calm demeanor Eyes: Lids and conjunctivae normal, PERRL ENMT: Mucous membranes are moist. Posterior pharynx clear of any exudate or lesions. Fair dentition.  Neck: normal, supple, no masses, no thyromegaly Respiratory: Non-labored breathing without accessory muscle use. Clear breath sounds to auscultation bilaterally Cardiovascular: Regular rate and rhythm, no murmurs, rubs, or gallops. No carotid bruits. No JVD. No LE edema. Palpable pedal pulses. Abdomen: Normoactive bowel sounds. No tenderness, non-distended, and no masses palpated. No hepatosplenomegaly. GU: No indwelling catheter Musculoskeletal: No clubbing / cyanosis. No joint deformity upper and lower extremities. Good ROM, no contractures. Normal muscle tone.  Skin: Warm, dry. No rashes, wounds, or ulcers. No significant lesions noted.  Neurologic: CN II-XII grossly intact. Gait not assessed. Speech normal. Subtle left shoulder weakness compared to right (RHD) in flexion, abduction, and also grip strength. +left pronator drift. Mild abnormalities in FTN bilaterally. LE's without weakness. No sensory deficits noted.  Psychiatric: Alert and oriented x3. Normal judgment and insight. Mood euthymic with congruent affect.   Labs on Admission: I have personally reviewed following labs and imaging studies  CBC: Recent Labs  Lab 05/03/18 1357  WBC 7.5  NEUTROABS 4.5  HGB 13.1  HCT 40.2  MCV 92.6  PLT 841   Basic Metabolic Panel: Recent Labs  Lab 05/03/18 1357  NA 134*  K 3.8  CL 103    CO2 23  GLUCOSE 401*  BUN 22  CREATININE 0.94  CALCIUM 9.0   GFR: Estimated Creatinine Clearance: 39.2 mL/min (by C-G formula based on SCr of 0.94 mg/dL). Liver Function Tests: Recent Labs  Lab 05/03/18 1357  AST 22  ALT 16  ALKPHOS 62  BILITOT 0.7  PROT 5.9*  ALBUMIN 3.6   No results for input(s): LIPASE, AMYLASE in the last 168 hours. No results for input(s): AMMONIA in the last 168 hours. Coagulation Profile: Recent Labs  Lab 05/03/18 1357  INR 0.87   Cardiac Enzymes: Recent Labs  Lab 05/03/18 1357  TROPONINI <0.03   BNP (last 3 results) No results for input(s): PROBNP in the last 8760 hours. HbA1C: No results for input(s): HGBA1C in the last 72 hours. CBG: Recent Labs  Lab 05/03/18 1223  GLUCAP 401*   Lipid Profile: No results for input(s): CHOL, HDL, LDLCALC, TRIG, CHOLHDL, LDLDIRECT in the last 72 hours. Thyroid Function Tests: No results for input(s): TSH, T4TOTAL, FREET4, T3FREE, THYROIDAB in the last 72 hours. Anemia Panel: No results for input(s): VITAMINB12, FOLATE, FERRITIN, TIBC, IRON, RETICCTPCT in the last 72 hours. Urine analysis:    Component Value Date/Time   COLORURINE  YELLOW 05/03/2018 1220   APPEARANCEUR CLEAR 05/03/2018 1220   LABSPEC 1.032 (H) 05/03/2018 1220   PHURINE 5.0 05/03/2018 1220   GLUCOSEU >=500 (A) 05/03/2018 1220   HGBUR NEGATIVE 05/03/2018 1220   BILIRUBINUR NEGATIVE 05/03/2018 [place] 05/03/2018 1220   PROTEINUR NEGATIVE 05/03/2018 1220   NITRITE NEGATIVE 05/03/2018 1220   LEUKOCYTESUR TRACE (A) 05/03/2018 1220    No results found for this or any previous visit (from the past 240 hour(s)).   Radiological Exams on Admission: Ct Head Wo Contrast  Result Date: 05/03/2018 CLINICAL DATA:  84 year old female with left-sided weakness, tremors and short-term memory loss for 2-3 days. Initial encounter. EXAM: CT HEAD WITHOUT CONTRAST TECHNIQUE: Contiguous axial images were obtained from the base of  the skull through the vertex without intravenous contrast. COMPARISON:  02/05/2017 head CT. FINDINGS: Brain: Acute/subacute moderate size infarct right frontal lobe/anterior right operculum region also involving the right caudate head and anterior aspect of the anterior limb of the right internal capsule. No associated hemorrhage. Local mass effect upon the right lateral ventricle. Chronic microvascular changes. Mild global atrophy. No intracranial mass lesion noted on this unenhanced exam. Vascular: Hyperdense right middle cerebral artery branch vessels consistent with acute infarct. Cavernous segment carotid artery calcified plaque. Skull: No acute abnormality. Sinuses/Orbits: Post lens replacement. No acute orbital abnormality. Visualized paranasal sinuses clear. Other: Mastoid air cells and middle ear cavities are clear. IMPRESSION: 1. Acute/subacute moderate size infarct right frontal lobe/anterior right operculum region also involving the right caudate head and anterior aspect of the anterior limb of the right internal capsule. No associated hemorrhage. Mild local mass effect upon the right lateral ventricle. Minimal bowing of the septum to the left without significant change/midline shift 2. Hyperdense right middle cerebral artery branch vessels consistent with acute infarct. 3. Chronic microvascular changes. Mild global atrophy. These results will be called to the ordering clinician or representative by the Radiologist Assistant, and communication documented in the PACS or zVision Dashboard. Electronically Signed   By: Genia Del M.D.   On: 05/03/2018 13:15   Mr Jodene Nam Head Wo Contrast  Result Date: 05/03/2018 CLINICAL DATA:  Left-sided weakness, tremors, and memory loss for 2-3 days. EXAM: MRI HEAD WITHOUT AND WITH CONTRAST MRA HEAD WITHOUT CONTRAST MRA NECK WITHOUT AND WITH CONTRAST TECHNIQUE: Multiplanar, multiecho pulse sequences of the brain and surrounding structures were obtained without and with  intravenous contrast. Angiographic images of the Circle of Willis were obtained using MRA technique without intravenous contrast. Angiographic images of the neck were obtained using MRA technique without and with intravenous contrast. Carotid stenosis measurements (when applicable) are obtained utilizing NASCET criteria, using the distal internal carotid diameter as the denominator. CONTRAST:  5 mL Gadavist COMPARISON:  Head CT 05/03/2018 FINDINGS: MRI HEAD FINDINGS Brain: There is a moderate-sized acute to early subacute right MCA territory infarct involving the frontal operculum, insula, and basal ganglia. A few punctate foci of diffusion abnormality and enhancement in the right parietal lobe are compatible with early subacute infarcts. There is a small chronic infarct involving the white matter of the posterior right frontal lobe. Scattered T2 hyperintensities elsewhere in the cerebral white matter bilaterally are nonspecific but compatible with mild chronic small vessel ischemic disease. Mild cerebral atrophy is within normal limits for age. An avidly enhancing extra-axial mass along the falx in the anterior left frontal region measures 25 x 27 x 36 mm (transverse x AP x craniocaudal) with a small amount of cystic change centrally. There is  local mass effect on the left frontal lobe without edema. No intracranial hemorrhage or extra-axial fluid collection is present. Vascular: Abnormal appearance of the distal right ICA, more fully evaluated below. Skull and upper cervical spine: Unremarkable bone marrow signal. Sinuses/Orbits: Bilateral cataract extraction. Paranasal sinuses and mastoid air cells are clear. Other: None. MRA HEAD FINDINGS The visualized distal vertebral arteries are patent to the basilar with the left being dominant. The basilar artery is widely patent. There is a large left posterior communicating artery with mildly hypoplastic left P1 segment. Moderate to severe right and severe left P2  stenoses are noted. The left internal carotid artery is patent from skull base to carotid terminus with at most mild stenosis at the level of the anterior genu. The right ICA is occluded proximally with reconstitution of the terminus. There is evidence of poor flow in the right MCA with near complete signal loss in the distal M1 and proximal M2 segments although thready enhancement is present in these regions on the postcontrast neck MRA. The right ACA is patent with evidence of less pronounced reduced flow proximally compared to the MCA. The left ACA and left MCA are patent with mild branch vessel atherosclerotic changes but no evidence of significant proximal stenosis. No aneurysm is identified. MRA NECK FINDINGS There is a standard 3 vessel aortic arch. The brachiocephalic and subclavian arteries are widely patent. The common carotid arteries are patent without evidence of significant stenosis. There is a severe, nearly occlusive stenosis involving the right ICA origin over a length of 5 mm. The right ICA distal to this is patent but small with reduced flow and progressive tapering, and the right ICA occludes completely at the skull base with distal intracranial reconstitution. The right external carotid artery origin is mildly narrowed. There is 50% stenosis of the proximal left ICA, and there is also mild narrowing of the left ECA origin. The vertebral arteries are patent with antegrade flow bilaterally. The left vertebral artery is slightly dominant. There is mild stenosis versus artifact in both proximal V1 segments with wide patency of the vertebral arteries more distally. IMPRESSION: 1. Moderate-sized acute to early subacute right MCA infarct. 2. Punctate early subacute right parietal infarcts. 3. Mild chronic small vessel ischemic disease. 4. 3.6 cm left anterior parafalcine meningioma without edema. 5. Severe, nearly completely occlusive stenosis of the right ICA origin with diminished distal flow,  complete occlusion at the skull base, and distal intracranial reconstitution. 6. Poor flow in the right MCA. 7. 50% proximal left ICA stenosis. 8. Severe bilateral P2 stenoses. Electronically Signed   By: Logan Bores M.D.   On: 05/03/2018 16:22   Mr Angiogram Neck W Or Wo Contrast  Result Date: 05/03/2018 CLINICAL DATA:  Left-sided weakness, tremors, and memory loss for 2-3 days. EXAM: MRI HEAD WITHOUT AND WITH CONTRAST MRA HEAD WITHOUT CONTRAST MRA NECK WITHOUT AND WITH CONTRAST TECHNIQUE: Multiplanar, multiecho pulse sequences of the brain and surrounding structures were obtained without and with intravenous contrast. Angiographic images of the Circle of Willis were obtained using MRA technique without intravenous contrast. Angiographic images of the neck were obtained using MRA technique without and with intravenous contrast. Carotid stenosis measurements (when applicable) are obtained utilizing NASCET criteria, using the distal internal carotid diameter as the denominator. CONTRAST:  5 mL Gadavist COMPARISON:  Head CT 05/03/2018 FINDINGS: MRI HEAD FINDINGS Brain: There is a moderate-sized acute to early subacute right MCA territory infarct involving the frontal operculum, insula, and basal ganglia. A few punctate foci  of diffusion abnormality and enhancement in the right parietal lobe are compatible with early subacute infarcts. There is a small chronic infarct involving the white matter of the posterior right frontal lobe. Scattered T2 hyperintensities elsewhere in the cerebral white matter bilaterally are nonspecific but compatible with mild chronic small vessel ischemic disease. Mild cerebral atrophy is within normal limits for age. An avidly enhancing extra-axial mass along the falx in the anterior left frontal region measures 25 x 27 x 36 mm (transverse x AP x craniocaudal) with a small amount of cystic change centrally. There is local mass effect on the left frontal lobe without edema. No  intracranial hemorrhage or extra-axial fluid collection is present. Vascular: Abnormal appearance of the distal right ICA, more fully evaluated below. Skull and upper cervical spine: Unremarkable bone marrow signal. Sinuses/Orbits: Bilateral cataract extraction. Paranasal sinuses and mastoid air cells are clear. Other: None. MRA HEAD FINDINGS The visualized distal vertebral arteries are patent to the basilar with the left being dominant. The basilar artery is widely patent. There is a large left posterior communicating artery with mildly hypoplastic left P1 segment. Moderate to severe right and severe left P2 stenoses are noted. The left internal carotid artery is patent from skull base to carotid terminus with at most mild stenosis at the level of the anterior genu. The right ICA is occluded proximally with reconstitution of the terminus. There is evidence of poor flow in the right MCA with near complete signal loss in the distal M1 and proximal M2 segments although thready enhancement is present in these regions on the postcontrast neck MRA. The right ACA is patent with evidence of less pronounced reduced flow proximally compared to the MCA. The left ACA and left MCA are patent with mild branch vessel atherosclerotic changes but no evidence of significant proximal stenosis. No aneurysm is identified. MRA NECK FINDINGS There is a standard 3 vessel aortic arch. The brachiocephalic and subclavian arteries are widely patent. The common carotid arteries are patent without evidence of significant stenosis. There is a severe, nearly occlusive stenosis involving the right ICA origin over a length of 5 mm. The right ICA distal to this is patent but small with reduced flow and progressive tapering, and the right ICA occludes completely at the skull base with distal intracranial reconstitution. The right external carotid artery origin is mildly narrowed. There is 50% stenosis of the proximal left ICA, and there is also mild  narrowing of the left ECA origin. The vertebral arteries are patent with antegrade flow bilaterally. The left vertebral artery is slightly dominant. There is mild stenosis versus artifact in both proximal V1 segments with wide patency of the vertebral arteries more distally. IMPRESSION: 1. Moderate-sized acute to early subacute right MCA infarct. 2. Punctate early subacute right parietal infarcts. 3. Mild chronic small vessel ischemic disease. 4. 3.6 cm left anterior parafalcine meningioma without edema. 5. Severe, nearly completely occlusive stenosis of the right ICA origin with diminished distal flow, complete occlusion at the skull base, and distal intracranial reconstitution. 6. Poor flow in the right MCA. 7. 50% proximal left ICA stenosis. 8. Severe bilateral P2 stenoses. Electronically Signed   By: Logan Bores M.D.   On: 05/03/2018 16:22   Mr Jeri Cos PY Contrast  Result Date: 05/03/2018 CLINICAL DATA:  Left-sided weakness, tremors, and memory loss for 2-3 days. EXAM: MRI HEAD WITHOUT AND WITH CONTRAST MRA HEAD WITHOUT CONTRAST MRA NECK WITHOUT AND WITH CONTRAST TECHNIQUE: Multiplanar, multiecho pulse sequences of the brain and surrounding  structures were obtained without and with intravenous contrast. Angiographic images of the Circle of Willis were obtained using MRA technique without intravenous contrast. Angiographic images of the neck were obtained using MRA technique without and with intravenous contrast. Carotid stenosis measurements (when applicable) are obtained utilizing NASCET criteria, using the distal internal carotid diameter as the denominator. CONTRAST:  5 mL Gadavist COMPARISON:  Head CT 05/03/2018 FINDINGS: MRI HEAD FINDINGS Brain: There is a moderate-sized acute to early subacute right MCA territory infarct involving the frontal operculum, insula, and basal ganglia. A few punctate foci of diffusion abnormality and enhancement in the right parietal lobe are compatible with early subacute  infarcts. There is a small chronic infarct involving the white matter of the posterior right frontal lobe. Scattered T2 hyperintensities elsewhere in the cerebral white matter bilaterally are nonspecific but compatible with mild chronic small vessel ischemic disease. Mild cerebral atrophy is within normal limits for age. An avidly enhancing extra-axial mass along the falx in the anterior left frontal region measures 25 x 27 x 36 mm (transverse x AP x craniocaudal) with a small amount of cystic change centrally. There is local mass effect on the left frontal lobe without edema. No intracranial hemorrhage or extra-axial fluid collection is present. Vascular: Abnormal appearance of the distal right ICA, more fully evaluated below. Skull and upper cervical spine: Unremarkable bone marrow signal. Sinuses/Orbits: Bilateral cataract extraction. Paranasal sinuses and mastoid air cells are clear. Other: None. MRA HEAD FINDINGS The visualized distal vertebral arteries are patent to the basilar with the left being dominant. The basilar artery is widely patent. There is a large left posterior communicating artery with mildly hypoplastic left P1 segment. Moderate to severe right and severe left P2 stenoses are noted. The left internal carotid artery is patent from skull base to carotid terminus with at most mild stenosis at the level of the anterior genu. The right ICA is occluded proximally with reconstitution of the terminus. There is evidence of poor flow in the right MCA with near complete signal loss in the distal M1 and proximal M2 segments although thready enhancement is present in these regions on the postcontrast neck MRA. The right ACA is patent with evidence of less pronounced reduced flow proximally compared to the MCA. The left ACA and left MCA are patent with mild branch vessel atherosclerotic changes but no evidence of significant proximal stenosis. No aneurysm is identified. MRA NECK FINDINGS There is a standard  3 vessel aortic arch. The brachiocephalic and subclavian arteries are widely patent. The common carotid arteries are patent without evidence of significant stenosis. There is a severe, nearly occlusive stenosis involving the right ICA origin over a length of 5 mm. The right ICA distal to this is patent but small with reduced flow and progressive tapering, and the right ICA occludes completely at the skull base with distal intracranial reconstitution. The right external carotid artery origin is mildly narrowed. There is 50% stenosis of the proximal left ICA, and there is also mild narrowing of the left ECA origin. The vertebral arteries are patent with antegrade flow bilaterally. The left vertebral artery is slightly dominant. There is mild stenosis versus artifact in both proximal V1 segments with wide patency of the vertebral arteries more distally. IMPRESSION: 1. Moderate-sized acute to early subacute right MCA infarct. 2. Punctate early subacute right parietal infarcts. 3. Mild chronic small vessel ischemic disease. 4. 3.6 cm left anterior parafalcine meningioma without edema. 5. Severe, nearly completely occlusive stenosis of the right ICA origin  with diminished distal flow, complete occlusion at the skull base, and distal intracranial reconstitution. 6. Poor flow in the right MCA. 7. 50% proximal left ICA stenosis. 8. Severe bilateral P2 stenoses. Electronically Signed   By: Logan Bores M.D.   On: 05/03/2018 16:22    EKG: Independently reviewed. NSR with significant artifact limiting interpretation. Axis is leftward, J point elevated in V3-V4 without acute ischemic changes noted. QTc 418msec.   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 Last  8.2% in Jan 2019.  - May benefit from DAPT vs. augmentation of antiplatelet (ASA 81mg  PTA), defer to neurology.  - Check lipid panel 12/17, plan to start high-intensity statin.   IDT1DM with hyperglycemia:  - Update A1c - Sensitive SSI and continue home lantus.  Diabetic nephropathy with stage III CKD:  - Avoid nephrotoxins as able.  History of HTN: Reports not being on any medications for this.  - Will not start medications at this time for permissive HTN, but will need longterm control.   Hypothyroidism: Reports persistently feeling cold.  - Check TSH - Continue stable dose synthroid.  DVT prophylaxis: Lovenox  Code Status: Full  Family Communication: Husband at bedside Disposition Plan: DC per therapy recommendations once stroke work up completed and patient is stable. Consults called: Neurology, Dr. Merlene Laughter, called by EDP  Admission status: Inpatient   The appropriate admission status for this patient is INPATIENT. Inpatient status is judged to be reasonable and necessary in order to provide the required intensity of service to ensure the patient's safety. The patient's presenting symptoms, physical exam findings, and initial radiographic and laboratory data in the context of their chronic comorbidities is felt to place them at high risk for further clinical deterioration. Furthermore, it is not anticipated that the patient will be medically stable for discharge from the hospital within 2 midnights of admission. The following factors support the admission status of inpatient.    The patient's presenting symptoms include left arm hemiparesis, confusion, gait disturbance.  The worrisome physical exam findings include Left arm weakness, pronator drift.  The initial radiographic and laboratory data are worrisome because of acute and subacute CVAs, hyperglycemia.  The chronic co-morbidities include IDT2DM, HTN, hypothyroidism.  Patient requires inpatient status due to high  intensity of service, high risk for further deterioration and high frequency of surveillance required.  I certify that at the point of admission it is my clinical judgment that the patient will require inpatient hospital care spanning beyond 2 midnights from the point of admission.      Patrecia Pour, MD [facility] [email or web address] Password [facility] 05/03/2018, 5:26 PM

## 2018-05-04 ENCOUNTER — Inpatient Hospital Stay (HOSPITAL_COMMUNITY): Payer: Medicare Other

## 2018-05-04 DIAGNOSIS — I351 Nonrheumatic aortic (valve) insufficiency: Secondary | ICD-10-CM

## 2018-05-04 DIAGNOSIS — I361 Nonrheumatic tricuspid (valve) insufficiency: Secondary | ICD-10-CM

## 2018-05-04 LAB — LIPID PANEL
Cholesterol: 273 mg/dL — ABNORMAL HIGH (ref 0–200)
HDL: 101 mg/dL (ref >40)
LDL CALC: 163 mg/dL — AB (ref 0–99)
Total CHOL/HDL Ratio: 2.7 RATIO
Triglycerides: 44 mg/dL (ref <150)
VLDL: 9 mg/dL (ref 0–40)

## 2018-05-04 LAB — GLUCOSE, CAPILLARY
Glucose-Capillary: 224 mg/dL — ABNORMAL HIGH (ref 70–99)
Glucose-Capillary: 35 mg/dL — CL (ref 70–99)
Glucose-Capillary: 196 mg/dL — ABNORMAL HIGH (ref 70–99)
Glucose-Capillary: 335 mg/dL — ABNORMAL HIGH (ref 70–99)

## 2018-05-04 LAB — ECHOCARDIOGRAM COMPLETE
Height: 65 in
Weight: 1955.2 oz

## 2018-05-04 LAB — BASIC METABOLIC PANEL
Anion gap: 7 (ref 5–15)
BUN: 22 mg/dL (ref 8–23)
CALCIUM: 9.3 mg/dL (ref 8.9–10.3)
CO2: 25 mmol/L (ref 22–32)
Chloride: 110 mmol/L (ref 98–111)
Creatinine, Ser: 0.74 mg/dL (ref 0.44–1.00)
GFR calc Af Amer: >60 mL/min (ref >60)
GFR calc non Af Amer: >60 mL/min (ref >60)
Glucose, Bld: 40 mg/dL — CL (ref 70–99)
Potassium: 3.7 mmol/L (ref 3.5–5.1)
SODIUM: 142 mmol/L (ref 135–145)

## 2018-05-04 LAB — HEMOGLOBIN A1C
HEMOGLOBIN A1C: 7.9 % — AB (ref 4.8–5.6)
Mean Plasma Glucose: 180.03 mg/dL

## 2018-05-04 LAB — TSH: TSH: 1.433 u[IU]/mL (ref 0.350–4.500)

## 2018-05-04 MED ORDER — ASPIRIN 325 MG PO TABS
325.0000 mg | ORAL_TABLET | Freq: Every day | ORAL | Status: DC
  Administered 2018-05-04: 325 mg via ORAL
  Filled 2018-05-04: qty 1

## 2018-05-04 MED ORDER — DEXTROSE 50 % IV SOLN
1.0000 | Freq: Once | INTRAVENOUS | Status: AC
  Administered 2018-05-04: 25 mL via INTRAVENOUS
  Filled 2018-05-04: qty 50

## 2018-05-04 MED ORDER — CLOPIDOGREL BISULFATE 75 MG PO TABS
75.0000 mg | ORAL_TABLET | Freq: Every day | ORAL | Status: DC
  Administered 2018-05-04: 75 mg via ORAL
  Filled 2018-05-04: qty 1

## 2018-05-04 MED ORDER — CLOPIDOGREL BISULFATE 75 MG PO TABS: 75.0000 mg | ORAL_TABLET | Freq: Every day | ORAL | 0 refills | Status: DC

## 2018-05-04 MED ORDER — ATORVASTATIN CALCIUM 40 MG PO TABS
40.0000 mg | ORAL_TABLET | Freq: Every day | ORAL | Status: DC
  Filled 2018-05-04: qty 1

## 2018-05-04 MED ORDER — ATORVASTATIN CALCIUM 40 MG PO TABS: 40.0000 mg | ORAL_TABLET | Freq: Every day | ORAL | 0 refills | Status: DC

## 2018-05-04 NOTE — Progress Notes (Signed)
Inpatient Diabetes Program Recommendations  AACE/ADA: New Consensus Statement on Inpatient Glycemic Control (2015)  Target Ranges:  Prepandial:   less than 140 mg/dL      Peak postprandial:   less than 180 mg/dL (1-2 hours)      Critically ill patients:  140 - 180 mg/dL   Lab Results  Component Value Date   GLUCAP 196 (H) 05/04/2018   HGBA1C 8.2 (H) 05/22/2017    Review of Glycemic Control Results for COBI, ALDAPE (MRN [id number]) as of 05/04/2018 08:12  Ref. Range 05/03/2018 12:23 05/03/2018 21:10 05/04/2018 06:54 05/04/2018 07:08 05/04/2018 07:52  Glucose-Capillary Latest Ref Range: 70 - 99 mg/dL 401 (H) 268 (H) 35 (LL) 224 (H) 196 (H)   Diabetes history: DM2 Outpatient Diabetes medications: Toujeo 18-22 units daily + Humalog 4 units bid Current orders for Inpatient glycemic control: Lantus 20 units daily + Novolog sensitive correction tid + hs  Inpatient Diabetes Program Recommendations:   Noted hypoglycemia and patient is currently NPO. While in the hospital: -Decrease Lantus by 50% to 10 units daily Secure chat sent to Dr. Bonner Puna with recommendations  Thank you, Nani Gasser. Josmar Messimer, RN, MSN, CDE  Diabetes Coordinator Inpatient Glycemic Control Team Team Pager [phone] (8am-5pm) 05/04/2018 8:21 AM

## 2018-05-04 NOTE — Consult Note (Signed)
Loaza A. Merlene Laughter, MD     [email or web address]          Danielle [name] is an 84 y.o. female.   ASSESSMENT/PLAN: 1.  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  Risk factors are age, hypertension and diabetes.  The stenosis appears to be a subtotal occlusion on MRI/MRA.  Another imaging study is recommended to document this.  If there is still flow, the patient should be referred for urgent carotid endarterectomy.  In the meantime, I will add Plavix.  He should be on dual antiplatelet agents for now.  Agree with statin medication.  Follow-up echocardiography.      The patient is an 84 year old right-handed white female who presents with a 3-day history of weakness and numbness involving the left hand.  She denies any dysarthria, dysphasia or symptoms involving the left leg although she apparently has had some difficulty with balance and ataxia.  She is on 81 mg aspirin and has been compliant with this.  She does have a longstanding history of diabetes but apparently has not been on any statins.  There is no evidence of chest pain, shortness of breath or headaches.  Review of systems otherwise negative.    GENERAL: This is a very pleasant thin female in no acute distress.  HEENT: This is normal.  ABDOMEN: soft  EXTREMITIES: No edema   BACK: Normal  SKIN: Normal by inspection.    MENTAL STATUS: Alert and oriented -actually orientation to her age and the month. Speech, language and cognition are generally intact. Judgment and insight normal.   CRANIAL NERVES: Pupils are equal, round and reactive to light and accomodation; extra ocular movements are full, there is no significant nystagmus; visual fields are full; upper and lower facial muscles are normal in strength and symmetric, there is no flattening of the nasolabial folds; tongue is midline; uvula is midline; shoulder elevation is normal.  MOTOR:  Deltoids and triceps are both weak at 4-/5.  Handgrip 5 and hand dexterity actually appears to be normal bilaterally.  There is no drift of the upper extremities.  Right hip flexion is 4+ left 4-.  Dorsiflexion about 5/5.  There is no drift of the lower extremities.  COORDINATION: Left finger to nose is normal, right finger to nose is normal, No rest tremor; no intention tremor; no postural tremor; no bradykinesia.  REFLEXES: Deep tendon reflexes are symmetrical and normal.   SENSATION: Normal to light touch, temperature, and pain.  The patient does not extinguish to double simultaneous stimulation.   NIH stroke scale 0    Blood pressure (!) 138/55, pulse 68, temperature 98.4 F (36.9 C), temperature source Oral, resp. rate 16, height 5\' 5"  (1.651 m), weight 55.4 kg, SpO2 100 %.  Past Medical History:  Diagnosis Date  . Arthritis    "some joint pain once in awhile" (05/26/2017)  . History of kidney stones   . Hypertension   . Hypothyroidism (acquired)   . Pneumothorax 02/05/2017   right-after fall  . Rib fractures 02/05/2017   right side  . Type II diabetes mellitus ([place])     Past Surgical History:  Procedure Laterality Date  . AUGMENTATION MAMMAPLASTY Bilateral   . DILATION AND CURETTAGE OF UTERUS    . FRACTURE SURGERY    . REVERSE SHOULDER ARTHROPLASTY Right 05/26/2017  . REVERSE SHOULDER ARTHROPLASTY Right 05/26/2017   Procedure: REVERSE RIGHT SHOULDER ARTHROPLASTY;  Surgeon: Meredith Pel, MD;  Location: [facility]  OR;  Service: Orthopedics;  Laterality: Right;  . TONSILLECTOMY    . TUBAL LIGATION      History reviewed. No pertinent family history.  Social History:  reports that she quit smoking about 28 years ago. Her smoking use included cigarettes. She has a 40.00 pack-year smoking history. She has never used smokeless tobacco. She reports that she does not drink alcohol or use drugs.  Allergies:  Allergies  Allergen Reactions  . Fosamax [Alendronate Sodium] Other  (See Comments)    MUSCLE ACHES  . Prednisone     Feels jittery and nauseous    Medications: Prior to Admission medications   Medication Sig Start Date End Date Taking? Authorizing Provider  aspirin EC 81 MG tablet Take 81 mg by mouth daily.   Yes [provider]  Calcium Carb-Cholecalciferol (CALCIUM 600 + D PO) Take 1 tablet by mouth daily.   Yes [provider]  Insulin Glargine (TOUJEO MAX SOLOSTAR) 300 UNIT/ML SOPN Inject 22 Units into the skin daily. Patient taking differently: Inject 18-22 Units into the skin daily. PER SLIDING SCALE 02/08/17  Yes Allie Bossier, MD  insulin lispro (HUMALOG) 100 UNIT/ML injection Inject 4 Units into the skin 2 (two) times daily.    Yes [provider]  levothyroxine (SYNTHROID, LEVOTHROID) 88 MCG tablet Take 88 mcg by mouth daily before breakfast.   Yes [provider]  Multiple Vitamin (MULTIVITAMIN WITH MINERALS) TABS Take 1 tablet by mouth daily.    Yes [provider]  multivitamin-lutein (OCUVITE-LUTEIN) CAPS capsule Take 1 capsule by mouth daily.   Yes [provider]  Omega-3 Fatty Acids (FISH OIL) 1000 MG CAPS Take 1,000 mg by mouth daily.   Yes [provider]  vitamin B-12 (CYANOCOBALAMIN) 1000 MCG tablet Take 1,000 mcg by mouth daily.   Yes [provider]    Scheduled Meds: .  stroke: mapping our early stages of recovery book   Does not apply Once  . aspirin  325 mg Oral Daily  . atorvastatin  40 mg Oral q1800  . enoxaparin (LOVENOX) injection  40 mg Subcutaneous Q24H  . insulin aspart  0-5 Units Subcutaneous QHS  . insulin aspart  0-9 Units Subcutaneous TID WC  . insulin glargine  20 Units Subcutaneous Daily  . levothyroxine  88 mcg Oral Q0600   Continuous Infusions: PRN Meds:.acetaminophen **OR** acetaminophen (TYLENOL) oral liquid 160 mg/5 mL **OR** acetaminophen     Results for orders placed or performed during the hospital encounter of 05/03/18 (from  the past 48 hour(s))  Urinalysis, Routine w reflex microscopic     Status: Abnormal   Collection Time: 05/03/18 12:20 PM  Result Value Ref Range   Color, Urine YELLOW YELLOW   APPearance CLEAR CLEAR   Specific Gravity, Urine 1.032 (H) 1.005 - 1.030   pH 5.0 5.0 - 8.0   Glucose, UA >=500 (A) NEGATIVE mg/dL   Hgb urine dipstick NEGATIVE NEGATIVE   Bilirubin Urine NEGATIVE NEGATIVE   Ketones, ur NEGATIVE NEGATIVE mg/dL   Protein, ur NEGATIVE NEGATIVE mg/dL   Nitrite NEGATIVE NEGATIVE   Leukocytes, UA TRACE (A) NEGATIVE   RBC / HPF 0-5 0 - 5 RBC/hpf   WBC, UA 6-10 0 - 5 WBC/hpf   Bacteria, UA RARE (A) NONE SEEN   Squamous Epithelial / LPF 0-5 0 - 5   Mucus PRESENT     Comment: Performed at [facility], [address] [place], [place] [address]  Glucose, capillary  Status: Abnormal   Collection Time: 05/03/18 12:23 PM  Result Value Ref Range   Glucose-Capillary 401 (H) 70 - 99 mg/dL  Protime-INR     Status: None   Collection Time: 05/03/18  1:57 PM  Result Value Ref Range   Prothrombin Time 11.8 11.4 - 15.2 seconds   INR 0.87     Comment: Performed at [facility], [address] [place], [place] [address]  APTT     Status: None   Collection Time: 05/03/18  1:57 PM  Result Value Ref Range   aPTT 27 24 - 36 seconds    Comment: Performed at [facility], [address] [place], [place] [address]  CBC     Status: None   Collection Time: 05/03/18  1:57 PM  Result Value Ref Range   WBC 7.5 4.0 - 10.5 K/uL   RBC 4.34 3.87 - 5.11 MIL/uL   Hemoglobin 13.1 12.0 - 15.0 g/dL   HCT 40.2 36.0 - 46.0 %   MCV 92.6 80.0 - 100.0 fL   MCH 30.2 26.0 - 34.0 pg   MCHC 32.6 30.0 - 36.0 g/dL   RDW 14.2 11.5 - 15.5 %   Platelets 252 150 - 400 K/uL   nRBC 0.0 0.0 - 0.2 %    Comment: Performed at [facility], [address] [place], [place] [address]  Differential     Status: None   Collection Time: 05/03/18  1:57 PM  Result Value Ref Range   Neutrophils Relative % 60 %   Neutro Abs 4.5  1.7 - 7.7 K/uL   Lymphocytes Relative 27 %   Lymphs Abs 2.0 0.7 - 4.0 K/uL   Monocytes Relative 10 %   Monocytes Absolute 0.7 0.1 - 1.0 K/uL   Eosinophils Relative 2 %   Eosinophils Absolute 0.1 0.0 - 0.5 K/uL   Basophils Relative 1 %   Basophils Absolute 0.1 0.0 - 0.1 K/uL   Immature Granulocytes 0 %   Abs Immature Granulocytes 0.01 0.00 - 0.07 K/uL    Comment: Performed at [facility], [address], [place], [place] [address]  Comprehensive metabolic panel     Status: Abnormal   Collection Time: 05/03/18  1:57 PM  Result Value Ref Range   Sodium 134 (L) 135 - 145 mmol/L   Potassium 3.8 3.5 - 5.1 mmol/L   Chloride 103 98 - 111 mmol/L   CO2 23 22 - 32 mmol/L   Glucose, Bld 401 (H) 70 - 99 mg/dL   BUN 22 8 - 23 mg/dL   Creatinine, Ser 0.94 0.44 - 1.00 mg/dL   Calcium 9.0 8.9 - 10.3 mg/dL   Total Protein 5.9 (L) 6.5 - 8.1 g/dL   Albumin 3.6 3.5 - 5.0 g/dL   AST 22 15 - 41 U/L   ALT 16 0 - 44 U/L   Alkaline Phosphatase 62 38 - 126 U/L   Total Bilirubin 0.7 0.3 - 1.2 mg/dL   GFR calc non Af Amer 56 (L) >60 mL/min   GFR calc Af Amer >60 >60 mL/min   Anion gap 8 5 - 15    Comment: Performed at [facility], [address] [place], [place] [address]  Troponin I - ONCE - STAT     Status: None   Collection Time: 05/03/18  1:57 PM  Result Value Ref Range   Troponin I <0.03 <0.03 ng/mL    Comment: Performed at [facility], [address], [place], [place] [address]  TSH  Status: None   Collection Time: 05/03/18  1:57 PM  Result Value Ref Range   TSH 1.433 0.350 - 4.500 uIU/mL    Comment: Performed by a 3rd Generation assay with a functional sensitivity of <=0.01 uIU/mL. Performed at [facility], [address], [place], [place] [address]   I-stat troponin, ED     Status: None   Collection Time: 05/03/18  1:59 PM  Result Value Ref Range   Troponin i, poc 0.00 0.00 - 0.08 ng/mL   Comment 3            Comment: Due to the release kinetics of cTnI, a negative result within the  first hours of the onset of symptoms does not rule out myocardial infarction with certainty. If myocardial infarction is still suspected, repeat the test at appropriate intervals.   Glucose, capillary     Status: Abnormal   Collection Time: 05/03/18  9:10 PM  Result Value Ref Range   Glucose-Capillary 268 (H) 70 - 99 mg/dL  Lipid panel     Status: Abnormal   Collection Time: 05/04/18  4:31 AM  Result Value Ref Range   Cholesterol 273 (H) 0 - 200 mg/dL   Triglycerides 44 <150 mg/dL   HDL 101 >40 mg/dL   Total CHOL/HDL Ratio 2.7 RATIO   VLDL 9 0 - 40 mg/dL   LDL Cholesterol 163 (H) 0 - 99 mg/dL    Comment:        Total Cholesterol/HDL:CHD Risk Coronary Heart Disease Risk Table                     Men   Women  1/2 Average Risk   3.4   3.3  Average Risk       5.0   4.4  2 X Average Risk   9.6   7.1  3 X Average Risk  23.4   11.0        Use the calculated Patient Ratio above and the CHD Risk Table to determine the patient's CHD Risk.        ATP III CLASSIFICATION (LDL):  <100     mg/dL   Optimal  100-129  mg/dL   Near or Above                    Optimal  130-159  mg/dL   Borderline  160-189  mg/dL   High  >190     mg/dL   Very High Performed at [address] [place], [place] [address]   Basic metabolic panel     Status: Abnormal   Collection Time: 05/04/18  4:31 AM  Result Value Ref Range   Sodium 142 135 - 145 mmol/L    Comment: DELTA CHECK NOTED   Potassium 3.7 3.5 - 5.1 mmol/L   Chloride 110 98 - 111 mmol/L   CO2 25 22 - 32 mmol/L   Glucose, Bld 40 (LL) 70 - 99 mg/dL    Comment: CRITICAL RESULT CALLED TO, READ BACK BY AND VERIFIED WITH: RHEW,J AT 6:30AM ON 05/04/18 BY FESTERMAN,C    BUN 22 8 - 23 mg/dL   Creatinine, Ser 0.74 0.44 - 1.00 mg/dL   Calcium 9.3 8.9 - 10.3 mg/dL   GFR calc non Af Amer >60 >60 mL/min   GFR calc Af Amer >60 >60 mL/min   Anion gap 7 5 - 15    Comment: Performed at [facility], [address], [place], [place] [address]  Glucose, capillary     Status: Abnormal   Collection Time: 05/04/18  6:54 AM  Result Value Ref Range   Glucose-Capillary 35 (LL) 70 - 99 mg/dL  Glucose, capillary     Status: Abnormal   Collection Time: 05/04/18  7:08 AM  Result Value Ref Range   Glucose-Capillary 224 (H) 70 - 99 mg/dL  Glucose, capillary     Status: Abnormal   Collection Time: 05/04/18  7:52 AM  Result Value Ref Range   Glucose-Capillary 196 (H) 70 - 99 mg/dL    Studies/Results:  MRI MRA BRAIN AND NECK FINDINGS: MRI HEAD FINDINGS  Brain: There is a moderate-sized acute to early subacute right MCA territory infarct involving the frontal operculum, insula, and basal ganglia. A few punctate foci of diffusion abnormality and enhancement in the right parietal lobe are compatible with early subacute infarcts. There is a small chronic infarct involving the white matter of the posterior right frontal lobe. Scattered T2 hyperintensities elsewhere in the cerebral white matter bilaterally are nonspecific but compatible with mild chronic small vessel ischemic disease.  Mild cerebral atrophy is within normal limits for age. An avidly enhancing extra-axial mass along the falx in the anterior left frontal region measures 25 x 27 x 36 mm (transverse x AP x craniocaudal) with a small amount of cystic change centrally. There is local mass effect on the left frontal lobe without edema. No intracranial hemorrhage or extra-axial fluid collection is present.  Vascular: Abnormal appearance of the distal right ICA, more fully evaluated below.  Skull and upper cervical spine: Unremarkable bone marrow signal.  Sinuses/Orbits: Bilateral cataract extraction. Paranasal sinuses and mastoid air cells are clear.  Other: None.  MRA HEAD FINDINGS  The visualized distal vertebral arteries are patent to the basilar with the left being dominant. The basilar artery is widely patent. There is a large left posterior  communicating artery with mildly hypoplastic left P1 segment. Moderate to severe right and severe left P2 stenoses are noted.  The left internal carotid artery is patent from skull base to carotid terminus with at most mild stenosis at the level of the anterior genu. The right ICA is occluded proximally with reconstitution of the terminus. There is evidence of poor flow in the right MCA with near complete signal loss in the distal M1 and proximal M2 segments although thready enhancement is present in these regions on the postcontrast neck MRA. The right ACA is patent with evidence of less pronounced reduced flow proximally compared to the MCA. The left ACA and left MCA are patent with mild branch vessel atherosclerotic changes but no evidence of significant proximal stenosis. No aneurysm is identified.  MRA NECK FINDINGS  There is a standard 3 vessel aortic arch. The brachiocephalic and subclavian arteries are widely patent.  The common carotid arteries are patent without evidence of significant stenosis. There is a severe, nearly occlusive stenosis involving the right ICA origin over a length of 5 mm. The right ICA distal to this is patent but small with reduced flow and progressive tapering, and the right ICA occludes completely at the skull base with distal intracranial reconstitution. The right external carotid artery origin is mildly narrowed. There is 50% stenosis of the proximal left ICA, and there is also mild narrowing of the left ECA origin.  The vertebral arteries are patent with antegrade flow bilaterally. The left vertebral artery is slightly dominant. There is mild stenosis versus artifact in both proximal V1 segments with wide patency of the vertebral  arteries more distally.  IMPRESSION: 1. Moderate-sized acute to early subacute right MCA infarct. 2. Punctate early subacute right parietal infarcts. 3. Mild chronic small vessel ischemic disease. 4. 3.6 cm  left anterior parafalcine meningioma without edema. 5. Severe, nearly completely occlusive stenosis of the right ICA origin with diminished distal flow, complete occlusion at the skull base, and distal intracranial reconstitution. 6. Poor flow in the right MCA. 7. 50% proximal left ICA stenosis. 8. Severe bilateral P2 stenoses.     The MRI MRA scans are reviewed in person.  There is a moderate size right frontal homogeneous and confluent increased signal on DWI with associated reduced signal on the ADC scan.  This is consistent with acute infarct.  There are a couple other similar findings involving the right parietal area.  There is increased signal involving the left frontal region on DWI.  There is a faint signal that is extra-axial and same signal approximately on T1 and enhances with contrast consistent with a likely meningioma although there is a central cord of ring enhancement which is somewhat concerning.  No hemorrhages appreciated.  There is signal dropout/severe stenosis/near occlusion of the right ICA origin.  This results in absent of the right MCA.  The left PCA is also not seen and the right PCA has significant luminal irregularity and poorly visualized.   ECHO- P   Marrisa Kimber A. Merlene Laughter, M.D.  Diplomate, Tax adviser of Psychiatry and Neurology ( Neurology). 05/04/2018, 8:00 AM

## 2018-05-04 NOTE — Progress Notes (Signed)
PROGRESS NOTE  Danielle [name]  HQP:[id number] DOB: [birth year] DOA: 05/03/2018 PCP: Dr. Reynaldo Minium  Outpatient Specialists: Pulmonology, Dr. Luan Pulling Brief Narrative: Danielle [name] is an 84 year old female with a history of IDT2DM and hypothyroidism who was brought to the ED on her husband's insistence due to 3 days of left arm weakness, confusion, and difficulty walking. In the ED, left arm weakness and pronator drift noted, labs unremarkable with the exception of glucose of 401. Gap and bicarb normal, no ketonuria.  CT demonstrated moderate acute/subacute infarct of the right frontal lobe, anterior limb of internal capsule, and right head of the caudate as well as right MCA hyperdensities consistent with acute infarct on a background of atrophy and chronic microvascular changes. Neurology was consulted and hospitalists asked to admit. Subsequent MRI brain, MRA head and neck demonstrated acute right frontal and parietal infarcts felt to be due to extracranial right carotid stenosis. Neurology recommended confirming with carotid U/S. If this confirms flow, the patient will be referred for urgent carotid endarterectomy. Plavix was added to aspirin, and statin was started. Echocardiogram is pending.  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   - 2D echocardiogram remains pending - SLP cleared for diet, PT recommended outpatient PT.  - Update HbA1c. Last 8.2% in Jan 2019.  - May benefit from DAPT vs. augmentation of antiplatelet (ASA 81mg  PTA), defer to neurology.  - Check lipid  panel 12/17, plan to start high-intensity statin.   IDT1DM with hyperglycemia: HbA1c 7.9%, will require tighter control. Hypoglycemic this morning in the setting of not taking anything by mouth.  - Sensitive SSI and continue home lantus. Continue these as she'll be eating again, unlikely to return to hypoglycemia given her home regimen and elevated A1c.   Diabetic nephropathy with stage III CKD: CrCl 4ml/min. - Avoid nephrotoxins as able.  History of HTN: Reports not being on any medications for this.  - Will not start medications at this time for permissive HTN, but will need longterm control.   Hypothyroidism: TSH 1.433.  - Continue stable dose synthroid.  DVT prophylaxis: Lovenox Code Status: Full Family Communication: Husband at bedside Disposition Plan: Uncertain, requires further work up.  Consultants:   Neurology, Dr. Merlene Laughter  Procedures:   Echocardiogram pending  Carotid U/S pending  Antimicrobials:  None   Subjective: Feels that left arm strength has improved. She remains unsteady on her feet, though she's had severe falls in the past and attributes some reluctance to walk independently to that. Felt good walking with PT today with a cane. No new numbness, weakness, speech changes.   Objective: Vitals:   05/04/18 0630 05/04/18 0631 05/04/18 0835 05/04/18 1045  BP:  (!) 138/55 (!) 128/48 (!) 140/45  Pulse:  68 71 75  Resp:  16 19 16   Temp:  98.4 F (36.9 C) 98.3 F (36.8 C) 98.3 F (36.8 C)  TempSrc:  Oral Oral Oral  SpO2:  100% 99% 99%  Weight: 55.4 kg     Height:        Intake/Output Summary (Last 24 hours) at 05/04/2018 1439 Last data filed at 05/04/2018 0900 Gross per 24 hour  Intake 0 ml  Output -  Net 0 ml   Filed Weights   05/03/18 1223 05/04/18 0630  Weight: 55.8 kg 55.4 kg    Gen: Elderly, well-appearing female in no distress Pulm: Non-labored breathing room air. Clear to auscultation bilaterally.  CV: Regular rate and rhythm.  No murmur, rub, or gallop. No JVD, no pedal edema. GI: Abdomen soft, non-tender, non-distended, with normoactive bowel sounds. No organomegaly or masses felt. Ext: Warm, no deformities Skin: No rashes, lesions or ulcers Neuro: Alert and oriented. Very subtle left arm shoulder > elbow > grip weakness (still 4+/5) and narrow based, slow gait noted. No other focal neurological deficits. Psych: Judgement and insight appear normal. Mood & affect appropriate.   Data Reviewed: I have personally reviewed following labs and imaging studies  CBC: Recent Labs  Lab 05/03/18 1357  WBC 7.5  NEUTROABS 4.5  HGB 13.1  HCT 40.2  MCV 92.6  PLT 768   Basic Metabolic Panel: Recent Labs  Lab 05/03/18 1357 05/04/18 0431  NA 134* 142  K 3.8 3.7  CL 103 110  CO2 23 25  GLUCOSE 401* 40*  BUN 22 22  CREATININE 0.94 0.74  CALCIUM 9.0 9.3   GFR: Estimated Creatinine Clearance: 45.8 mL/min (by C-G formula based on SCr of 0.74 mg/dL). Liver Function Tests: Recent Labs  Lab 05/03/18 1357  AST 22  ALT 16  ALKPHOS 62  BILITOT 0.7  PROT 5.9*  ALBUMIN 3.6   No results for input(s): LIPASE, AMYLASE in the last 168 hours. No results for input(s): AMMONIA in the last 168 hours. Coagulation Profile: Recent Labs  Lab 05/03/18 1357  INR 0.87   Cardiac Enzymes: Recent Labs  Lab 05/03/18 1357  TROPONINI <0.03   BNP (last 3 results) No results for input(s): PROBNP in the last 8760 hours. HbA1C: Recent Labs    05/03/18 1357  HGBA1C 7.9*   CBG: Recent Labs  Lab 05/03/18 2110 05/04/18 0654 05/04/18 0708 05/04/18 0752 05/04/18 1132  GLUCAP 268* 35* 224* 196* 335*   Lipid Profile: Recent Labs    05/04/18 0431  CHOL 273*  HDL 101  LDLCALC 163*  TRIG 44  CHOLHDL 2.7   Thyroid Function Tests: Recent Labs    05/03/18 1357  TSH 1.433   Anemia Panel: No results for input(s): VITAMINB12, FOLATE, FERRITIN, TIBC, IRON, RETICCTPCT in the last 72 hours. Urine analysis:      Component Value Date/Time   COLORURINE YELLOW 05/03/2018 1220   APPEARANCEUR CLEAR 05/03/2018 1220   LABSPEC 1.032 (H) 05/03/2018 1220   PHURINE 5.0 05/03/2018 1220   GLUCOSEU >=500 (A) 05/03/2018 1220   HGBUR NEGATIVE 05/03/2018 [place] 05/03/2018 1220   KETONESUR NEGATIVE 05/03/2018 1220   PROTEINUR NEGATIVE 05/03/2018 1220   NITRITE NEGATIVE 05/03/2018 1220   LEUKOCYTESUR TRACE (A) 05/03/2018 1220   No results found for this or any previous visit (from the past 240 hour(s)).    Radiology Studies: Ct Head Wo Contrast  Result Date: 05/03/2018 CLINICAL DATA:  84 year old female with left-sided weakness, tremors and short-term memory loss for 2-3 days. Initial encounter. EXAM: CT HEAD WITHOUT CONTRAST TECHNIQUE: Contiguous axial images were obtained from the base of the skull through the vertex without intravenous contrast. COMPARISON:  02/05/2017 head CT. FINDINGS: Brain: Acute/subacute moderate size infarct right frontal lobe/anterior right operculum region also involving the right caudate head and anterior aspect of the anterior limb of the right internal capsule. No associated hemorrhage. Local mass effect upon  the right lateral ventricle. Chronic microvascular changes. Mild global atrophy. No intracranial mass lesion noted on this unenhanced exam. Vascular: Hyperdense right middle cerebral artery branch vessels consistent with acute infarct. Cavernous segment carotid artery calcified plaque. Skull: No acute abnormality. Sinuses/Orbits: Post lens replacement. No acute orbital abnormality. Visualized paranasal sinuses clear. Other: Mastoid air cells and middle ear cavities are clear. IMPRESSION: 1. Acute/subacute moderate size infarct right frontal lobe/anterior right operculum region also involving the right caudate head and anterior aspect of the anterior limb of the right internal capsule. No associated hemorrhage. Mild local mass effect upon the right lateral ventricle.  Minimal bowing of the septum to the left without significant change/midline shift 2. Hyperdense right middle cerebral artery branch vessels consistent with acute infarct. 3. Chronic microvascular changes. Mild global atrophy. These results will be called to the ordering clinician or representative by the Radiologist Assistant, and communication documented in the PACS or zVision Dashboard. Electronically Signed   By: Genia Del M.D.   On: 05/03/2018 13:15   Mr Jodene Nam Head Wo Contrast  Result Date: 05/03/2018 CLINICAL DATA:  Left-sided weakness, tremors, and memory loss for 2-3 days. EXAM: MRI HEAD WITHOUT AND WITH CONTRAST MRA HEAD WITHOUT CONTRAST MRA NECK WITHOUT AND WITH CONTRAST TECHNIQUE: Multiplanar, multiecho pulse sequences of the brain and surrounding structures were obtained without and with intravenous contrast. Angiographic images of the Circle of Willis were obtained using MRA technique without intravenous contrast. Angiographic images of the neck were obtained using MRA technique without and with intravenous contrast. Carotid stenosis measurements (when applicable) are obtained utilizing NASCET criteria, using the distal internal carotid diameter as the denominator. CONTRAST:  5 mL Gadavist COMPARISON:  Head CT 05/03/2018 FINDINGS: MRI HEAD FINDINGS Brain: There is a moderate-sized acute to early subacute right MCA territory infarct involving the frontal operculum, insula, and basal ganglia. A few punctate foci of diffusion abnormality and enhancement in the right parietal lobe are compatible with early subacute infarcts. There is a small chronic infarct involving the white matter of the posterior right frontal lobe. Scattered T2 hyperintensities elsewhere in the cerebral white matter bilaterally are nonspecific but compatible with mild chronic small vessel ischemic disease. Mild cerebral atrophy is within normal limits for age. An avidly enhancing extra-axial mass along the falx in the anterior  left frontal region measures 25 x 27 x 36 mm (transverse x AP x craniocaudal) with a small amount of cystic change centrally. There is local mass effect on the left frontal lobe without edema. No intracranial hemorrhage or extra-axial fluid collection is present. Vascular: Abnormal appearance of the distal right ICA, more fully evaluated below. Skull and upper cervical spine: Unremarkable bone marrow signal. Sinuses/Orbits: Bilateral cataract extraction. Paranasal sinuses and mastoid air cells are clear. Other: None. MRA HEAD FINDINGS The visualized distal vertebral arteries are patent to the basilar with the left being dominant. The basilar artery is widely patent. There is a large left posterior communicating artery with mildly hypoplastic left P1 segment. Moderate to severe right and severe left P2 stenoses are noted. The left internal carotid artery is patent from skull base to carotid terminus with at most mild stenosis at the level of the anterior genu. The right ICA is occluded proximally with reconstitution of the terminus. There is evidence of poor flow in the right MCA with near complete signal loss in the distal M1 and proximal M2 segments although thready enhancement is present in these regions on the postcontrast neck MRA. The right ACA is patent  with evidence of less pronounced reduced flow proximally compared to the MCA. The left ACA and left MCA are patent with mild branch vessel atherosclerotic changes but no evidence of significant proximal stenosis. No aneurysm is identified. MRA NECK FINDINGS There is a standard 3 vessel aortic arch. The brachiocephalic and subclavian arteries are widely patent. The common carotid arteries are patent without evidence of significant stenosis. There is a severe, nearly occlusive stenosis involving the right ICA origin over a length of 5 mm. The right ICA distal to this is patent but small with reduced flow and progressive tapering, and the right ICA occludes  completely at the skull base with distal intracranial reconstitution. The right external carotid artery origin is mildly narrowed. There is 50% stenosis of the proximal left ICA, and there is also mild narrowing of the left ECA origin. The vertebral arteries are patent with antegrade flow bilaterally. The left vertebral artery is slightly dominant. There is mild stenosis versus artifact in both proximal V1 segments with wide patency of the vertebral arteries more distally. IMPRESSION: 1. Moderate-sized acute to early subacute right MCA infarct. 2. Punctate early subacute right parietal infarcts. 3. Mild chronic small vessel ischemic disease. 4. 3.6 cm left anterior parafalcine meningioma without edema. 5. Severe, nearly completely occlusive stenosis of the right ICA origin with diminished distal flow, complete occlusion at the skull base, and distal intracranial reconstitution. 6. Poor flow in the right MCA. 7. 50% proximal left ICA stenosis. 8. Severe bilateral P2 stenoses. Electronically Signed   By: Logan Bores M.D.   On: 05/03/2018 16:22   Mr Angiogram Neck W Or Wo Contrast  Result Date: 05/03/2018 CLINICAL DATA:  Left-sided weakness, tremors, and memory loss for 2-3 days. EXAM: MRI HEAD WITHOUT AND WITH CONTRAST MRA HEAD WITHOUT CONTRAST MRA NECK WITHOUT AND WITH CONTRAST TECHNIQUE: Multiplanar, multiecho pulse sequences of the brain and surrounding structures were obtained without and with intravenous contrast. Angiographic images of the Circle of Willis were obtained using MRA technique without intravenous contrast. Angiographic images of the neck were obtained using MRA technique without and with intravenous contrast. Carotid stenosis measurements (when applicable) are obtained utilizing NASCET criteria, using the distal internal carotid diameter as the denominator. CONTRAST:  5 mL Gadavist COMPARISON:  Head CT 05/03/2018 FINDINGS: MRI HEAD FINDINGS Brain: There is a moderate-sized acute to early  subacute right MCA territory infarct involving the frontal operculum, insula, and basal ganglia. A few punctate foci of diffusion abnormality and enhancement in the right parietal lobe are compatible with early subacute infarcts. There is a small chronic infarct involving the white matter of the posterior right frontal lobe. Scattered T2 hyperintensities elsewhere in the cerebral white matter bilaterally are nonspecific but compatible with mild chronic small vessel ischemic disease. Mild cerebral atrophy is within normal limits for age. An avidly enhancing extra-axial mass along the falx in the anterior left frontal region measures 25 x 27 x 36 mm (transverse x AP x craniocaudal) with a small amount of cystic change centrally. There is local mass effect on the left frontal lobe without edema. No intracranial hemorrhage or extra-axial fluid collection is present. Vascular: Abnormal appearance of the distal right ICA, more fully evaluated below. Skull and upper cervical spine: Unremarkable bone marrow signal. Sinuses/Orbits: Bilateral cataract extraction. Paranasal sinuses and mastoid air cells are clear. Other: None. MRA HEAD FINDINGS The visualized distal vertebral arteries are patent to the basilar with the left being dominant. The basilar artery is widely patent. There is a large  left posterior communicating artery with mildly hypoplastic left P1 segment. Moderate to severe right and severe left P2 stenoses are noted. The left internal carotid artery is patent from skull base to carotid terminus with at most mild stenosis at the level of the anterior genu. The right ICA is occluded proximally with reconstitution of the terminus. There is evidence of poor flow in the right MCA with near complete signal loss in the distal M1 and proximal M2 segments although thready enhancement is present in these regions on the postcontrast neck MRA. The right ACA is patent with evidence of less pronounced reduced flow proximally  compared to the MCA. The left ACA and left MCA are patent with mild branch vessel atherosclerotic changes but no evidence of significant proximal stenosis. No aneurysm is identified. MRA NECK FINDINGS There is a standard 3 vessel aortic arch. The brachiocephalic and subclavian arteries are widely patent. The common carotid arteries are patent without evidence of significant stenosis. There is a severe, nearly occlusive stenosis involving the right ICA origin over a length of 5 mm. The right ICA distal to this is patent but small with reduced flow and progressive tapering, and the right ICA occludes completely at the skull base with distal intracranial reconstitution. The right external carotid artery origin is mildly narrowed. There is 50% stenosis of the proximal left ICA, and there is also mild narrowing of the left ECA origin. The vertebral arteries are patent with antegrade flow bilaterally. The left vertebral artery is slightly dominant. There is mild stenosis versus artifact in both proximal V1 segments with wide patency of the vertebral arteries more distally. IMPRESSION: 1. Moderate-sized acute to early subacute right MCA infarct. 2. Punctate early subacute right parietal infarcts. 3. Mild chronic small vessel ischemic disease. 4. 3.6 cm left anterior parafalcine meningioma without edema. 5. Severe, nearly completely occlusive stenosis of the right ICA origin with diminished distal flow, complete occlusion at the skull base, and distal intracranial reconstitution. 6. Poor flow in the right MCA. 7. 50% proximal left ICA stenosis. 8. Severe bilateral P2 stenoses. Electronically Signed   By: Logan Bores M.D.   On: 05/03/2018 16:22   Mr Jeri Cos YH Contrast  Result Date: 05/03/2018 CLINICAL DATA:  Left-sided weakness, tremors, and memory loss for 2-3 days. EXAM: MRI HEAD WITHOUT AND WITH CONTRAST MRA HEAD WITHOUT CONTRAST MRA NECK WITHOUT AND WITH CONTRAST TECHNIQUE: Multiplanar, multiecho pulse sequences  of the brain and surrounding structures were obtained without and with intravenous contrast. Angiographic images of the Circle of Willis were obtained using MRA technique without intravenous contrast. Angiographic images of the neck were obtained using MRA technique without and with intravenous contrast. Carotid stenosis measurements (when applicable) are obtained utilizing NASCET criteria, using the distal internal carotid diameter as the denominator. CONTRAST:  5 mL Gadavist COMPARISON:  Head CT 05/03/2018 FINDINGS: MRI HEAD FINDINGS Brain: There is a moderate-sized acute to early subacute right MCA territory infarct involving the frontal operculum, insula, and basal ganglia. A few punctate foci of diffusion abnormality and enhancement in the right parietal lobe are compatible with early subacute infarcts. There is a small chronic infarct involving the white matter of the posterior right frontal lobe. Scattered T2 hyperintensities elsewhere in the cerebral white matter bilaterally are nonspecific but compatible with mild chronic small vessel ischemic disease. Mild cerebral atrophy is within normal limits for age. An avidly enhancing extra-axial mass along the falx in the anterior left frontal region measures 25 x 27 x 36 mm (transverse  x AP x craniocaudal) with a small amount of cystic change centrally. There is local mass effect on the left frontal lobe without edema. No intracranial hemorrhage or extra-axial fluid collection is present. Vascular: Abnormal appearance of the distal right ICA, more fully evaluated below. Skull and upper cervical spine: Unremarkable bone marrow signal. Sinuses/Orbits: Bilateral cataract extraction. Paranasal sinuses and mastoid air cells are clear. Other: None. MRA HEAD FINDINGS The visualized distal vertebral arteries are patent to the basilar with the left being dominant. The basilar artery is widely patent. There is a large left posterior communicating artery with mildly  hypoplastic left P1 segment. Moderate to severe right and severe left P2 stenoses are noted. The left internal carotid artery is patent from skull base to carotid terminus with at most mild stenosis at the level of the anterior genu. The right ICA is occluded proximally with reconstitution of the terminus. There is evidence of poor flow in the right MCA with near complete signal loss in the distal M1 and proximal M2 segments although thready enhancement is present in these regions on the postcontrast neck MRA. The right ACA is patent with evidence of less pronounced reduced flow proximally compared to the MCA. The left ACA and left MCA are patent with mild branch vessel atherosclerotic changes but no evidence of significant proximal stenosis. No aneurysm is identified. MRA NECK FINDINGS There is a standard 3 vessel aortic arch. The brachiocephalic and subclavian arteries are widely patent. The common carotid arteries are patent without evidence of significant stenosis. There is a severe, nearly occlusive stenosis involving the right ICA origin over a length of 5 mm. The right ICA distal to this is patent but small with reduced flow and progressive tapering, and the right ICA occludes completely at the skull base with distal intracranial reconstitution. The right external carotid artery origin is mildly narrowed. There is 50% stenosis of the proximal left ICA, and there is also mild narrowing of the left ECA origin. The vertebral arteries are patent with antegrade flow bilaterally. The left vertebral artery is slightly dominant. There is mild stenosis versus artifact in both proximal V1 segments with wide patency of the vertebral arteries more distally. IMPRESSION: 1. Moderate-sized acute to early subacute right MCA infarct. 2. Punctate early subacute right parietal infarcts. 3. Mild chronic small vessel ischemic disease. 4. 3.6 cm left anterior parafalcine meningioma without edema. 5. Severe, nearly completely  occlusive stenosis of the right ICA origin with diminished distal flow, complete occlusion at the skull base, and distal intracranial reconstitution. 6. Poor flow in the right MCA. 7. 50% proximal left ICA stenosis. 8. Severe bilateral P2 stenoses. Electronically Signed   By: Logan Bores M.D.   On: 05/03/2018 16:22    Scheduled Meds: .  stroke: mapping our early stages of recovery book   Does not apply Once  . aspirin  325 mg Oral Daily  . atorvastatin  40 mg Oral q1800  . clopidogrel  75 mg Oral Q breakfast  . enoxaparin (LOVENOX) injection  40 mg Subcutaneous Q24H  . insulin aspart  0-5 Units Subcutaneous QHS  . insulin aspart  0-9 Units Subcutaneous TID WC  . insulin glargine  20 Units Subcutaneous Daily  . levothyroxine  88 mcg Oral Q0600   Continuous Infusions:   LOS: 1 day   Time spent: 25 minutes.  Patrecia Pour, MD [facility] [email or web address] Password [facility] 05/04/2018, 2:39 PM

## 2018-05-04 NOTE — Evaluation (Signed)
Physical Therapy Evaluation Patient Details Name: Danielle [name] MRN: [id number] DOB: November 24, 1931 Today's Date: 05/04/2018   History of Present Illness  Danielle [name] is an 84 year old female with a history of IDT2DM and hypothyroidism who was brought to the ED on her husband's insistence due to 3 days of left arm weakness, confusion, and difficulty walking. MRI positive for acute CVA    Clinical Impression  Patient functioning near baseline for functional mobility and gait, able to ambulate on level surfaces and up/down ramps without loss of balance, but frequently leans on side rails and nearby objects for support which is baseline for patient per patient's spouse.  Patient tolerated sitting up in chair with spouse present after therapy.  Plan:  Patient discharged from physical therapy to care of nursing for ambulation daily as tolerated for length of stay.    Follow Up Recommendations Outpatient PT(for balance training)    Equipment Recommendations  None recommended by PT    Recommendations for Other Services       Precautions / Restrictions Precautions Precautions: Fall Restrictions Weight Bearing Restrictions: No      Mobility  Bed Mobility Overal bed mobility: Independent                Transfers Overall transfer level: Modified independent   Transfers: Sit to/from Stand;Stand Pivot Transfers Sit to Stand: Modified independent (Device/Increase time) Stand pivot transfers: Modified independent (Device/Increase time)       General transfer comment: frequently leans on nearby objects for support  Ambulation/Gait Ambulation/Gait assistance: Supervision Gait Distance (Feet): 150 Feet Assistive device: Straight cane Gait Pattern/deviations: Step-through pattern;Decreased step length - left;Decreased stance time - left;WFL(Within Functional Limits) Gait velocity: decreased   General Gait Details: grossly WFL, demonstrates good return for ambulation on  level, inclined and declined surfaces without loss of balance, frequently has to lean on siderails even while using [facility]  Stairs            Wheelchair Mobility    Modified Rankin (Stroke Patients Only)       Balance Overall balance assessment: Mild deficits observed, not formally tested                                           Pertinent Vitals/Pain Pain Assessment: No/denies pain    Home Living Family/patient expects to be discharged to:: Private residence Living Arrangements: Spouse/significant other Available Help at Discharge: Family;Available 24 hours/day Type of Home: House   Entrance Stairs-Rails: Right;Left;Can reach both Entrance Stairs-Number of Steps: 2 Home Layout: Multi-level;Able to live on main level with bedroom/bathroom Home Equipment: Kasandra Knudsen - single point;Bedside commode;Grab bars - tub/shower;Walker - standard      Prior Function Level of Independence: Independent with assistive device(s)         Comments: household and short distanced community ambulator, frequently leans on nearby objects for support at baseline, drives     Hand Dominance   Dominant Hand: Right    Extremity/Trunk Assessment   Upper Extremity Assessment Upper Extremity Assessment: Defer to OT evaluation    Lower Extremity Assessment Lower Extremity Assessment: Overall WFL for tasks assessed;RLE deficits/detail;LLE deficits/detail RLE Deficits / Details: grossly 5/5 LLE Deficits / Details: grossly -4/5    Cervical / Trunk Assessment Cervical / Trunk Assessment: Normal  Communication   Communication: No difficulties  Cognition Arousal/Alertness: Awake/alert Behavior During Therapy: WFL for tasks assessed/performed  Overall Cognitive Status: Within Functional Limits for tasks assessed                                        General Comments      Exercises     Assessment/Plan    PT Assessment All further PT needs can be met in  the next venue of care  PT Problem List         PT Treatment Interventions      PT Goals (Current goals can be found in the Care Plan section)  Acute Rehab PT Goals Patient Stated Goal: return home with spouse to assist PT Goal Formulation: With patient/family Time For Goal Achievement: 05/04/18 Potential to Achieve Goals: Good    Frequency     Barriers to discharge        Co-evaluation               AM-PAC PT "6 Clicks" Mobility  Outcome Measure Help needed turning from your back to your side while in a flat bed without using bedrails?: None Help needed moving from lying on your back to sitting on the side of a flat bed without using bedrails?: None Help needed moving to and from a bed to a chair (including a wheelchair)?: None Help needed standing up from a chair using your arms (e.g., wheelchair or bedside chair)?: None Help needed to walk in hospital room?: A Little Help needed climbing 3-5 steps with a railing? : A Little 6 Click Score: 22    End of Session   Activity Tolerance: Patient tolerated treatment well Patient left: in chair;with call bell/phone within reach;with family/visitor present Nurse Communication: Mobility status PT Visit Diagnosis: Unsteadiness on feet (R26.81);Other abnormalities of gait and mobility (R26.89);Muscle weakness (generalized) (M62.81)    Time: 7471-5953 PT Time Calculation (min) (ACUTE ONLY): 24 min   Charges:   PT Evaluation $PT Eval Moderate Complexity: 1 Mod PT Treatments $Therapeutic Activity: 23-37 mins        12:08 PM, 05/04/18 Lonell Grandchild, MPT Physical Therapist with [facility] 336 [phone] office [phone] mobile phone

## 2018-05-04 NOTE — Progress Notes (Signed)
Patient discharged home today per MD orders. Patient vital signs WDL. IV removed and site WDL. Discharge Instructions including follow up appointments, medications, and education reviewed with patient. Patient verbalizes understanding. Patient is transported out via wheelchair.  

## 2018-05-04 NOTE — Evaluation (Signed)
Occupational Therapy Evaluation Patient Details Name: Danielle [name] MRN: [id number] DOB: 1932-03-15 Today's Date: 05/04/2018    History of Present Illness Danielle [name] is an 84 year old female with a history of IDT2DM and hypothyroidism who was brought to the ED on her husband's insistence due to 3 days of left arm weakness, confusion, and difficulty walking. MRI positive for acute CVA   Clinical Impression   Pt received supine in bed, husband present, agreeable to OT evaluation. OT is familiar with pt and PLOF from outpatient rehabilitation in 07/2017 (of note RUE baseline is approximately 50% range s/p TSA).Pt demonstrates slight LUE weakness and fine motor coordination difficulties during ADL completion. Increased time needed for task completion. Recommend outpatient OT services to improve strength and coordination in LUE required for functional task completion. No further acute OT services required at this time.     Follow Up Recommendations  Outpatient OT    Equipment Recommendations  Tub/shower seat       Precautions / Restrictions Precautions Precautions: Fall Restrictions Weight Bearing Restrictions: No      Mobility Bed Mobility Overal bed mobility: Independent                Transfers Overall transfer level: Needs assistance Equipment used: 1 person hand held assist Transfers: Sit to/from Stand Sit to Stand: Min assist         General transfer comment: Pt with slight LOB upon standing, min assist to correct posterior lean        ADL either performed or assessed with clinical judgement   ADL Overall ADL's : Needs assistance/impaired     Grooming: Wash/dry hands;Min Dispensing optician: Min guard;Ambulation;Regular Toilet;Grab bars   Toileting- Clothing Manipulation and Hygiene: Min guard;Sit to/from stand       Functional mobility during ADLs: Min guard       Vision Baseline Vision/History:  Wears glasses Wears Glasses: At all times Patient Visual Report: No change from baseline Vision Assessment?: No apparent visual deficits            Pertinent Vitals/Pain Pain Assessment: No/denies pain     Hand Dominance Right   Extremity/Trunk Assessment Upper Extremity Assessment Upper Extremity Assessment: LUE deficits/detail LUE Deficits / Details: strength 4/5 throughout, slightly decreased grip strength LUE Sensation: WNL LUE Coordination: decreased fine motor   Lower Extremity Assessment Lower Extremity Assessment: Defer to PT evaluation   Cervical / Trunk Assessment Cervical / Trunk Assessment: Normal   Communication Communication Communication: No difficulties   Cognition Arousal/Alertness: Awake/alert Behavior During Therapy: WFL for tasks assessed/performed Overall Cognitive Status: Within Functional Limits for tasks assessed                                                Home Living Family/patient expects to be discharged to:: Private residence Living Arrangements: Spouse/significant other Available Help at Discharge: Family;Available 24 hours/day Type of Home: House Home Access: Stairs to enter [employer] of Steps: 2 Entrance Stairs-Rails: Right;Left;Can reach both Home Layout: Multi-level;Able to live on main level with bedroom/bathroom     Bathroom Shower/Tub: Tub/shower unit;Curtain   Bathroom Toilet: Handicapped height     Home Equipment: Environmental consultant - 2 wheels;Cane - single point;Bedside commode;Grab bars - tub/shower  Prior Functioning/Environment Level of Independence: Independent        Comments: Pt independent with ADLs, no DME used for functional mobility        OT Problem List: Decreased strength;Decreased activity tolerance;Impaired balance (sitting and/or standing);Decreased coordination;Decreased safety awareness;Impaired UE functional use    AM-PAC OT "6 Clicks" Daily Activity      Outcome Measure Help from another person eating meals?: None Help from another person taking care of personal grooming?: None Help from another person toileting, which includes using toliet, bedpan, or urinal?: A Little Help from another person bathing (including washing, rinsing, drying)?: A Little Help from another person to put on and taking off regular upper body clothing?: None Help from another person to put on and taking off regular lower body clothing?: None 6 Click Score: 22   End of Session Equipment Utilized During Treatment: Gait belt Nurse Communication: Mobility status  Activity Tolerance: Patient tolerated treatment well Patient left: in chair;with call bell/phone within reach;with chair alarm set;Other (comment)(with PT)  OT Visit Diagnosis: Muscle weakness (generalized) (M62.81)                Time: 6381-7711 OT Time Calculation (min): 32 min Charges:  OT General Charges $OT Visit: 1 Visit OT Evaluation $OT Eval Low Complexity: [place], OTR/L  [phone] 05/04/2018, 8:23 AM

## 2018-05-04 NOTE — Discharge Summary (Signed)
Physician Discharge Summary  Danielle [name] TIR:[id number] DOB: 07-10-1931 DOA: 05/03/2018  PCP: Sinda Du, MD  Admit date: 05/03/2018 Discharge date: 05/04/2018  Admitted From: Home Disposition: Home   Recommendations for Outpatient Follow-up:  1. Follow up with PCP in 1-2 weeks 2. Follow up with neurology in 1 month  Home Health: Outpatient PT Equipment/Devices: None new Discharge Condition: Stable CODE STATUS: Full Diet recommendation: Heart healthy, carb-modified  Brief/Interim Summary: Danielle A Bondurantis an 84 year old female with a history of IDT2DM and hypothyroidism who was brought to the ED on her husband's insistence due to 3 days of left arm weakness, confusion, and difficulty walking. In the ED, left arm weakness and pronator drift noted, labs unremarkable with the exception of glucose of 401. Gap and bicarb normal, no ketonuria. CT demonstrated moderate acute/subacute infarct of the right frontal lobe, anterior limb of internal capsule, and right head of the caudate as well as right MCA hyperdensities consistent with acute infarct on a background of atrophy and chronic microvascular changes. Neurology was consulted and hospitalists asked to admit. Subsequent MRI brain, MRA head and neck demonstrated acute right frontal and parietal infarcts felt to be due to extracranial right carotid stenosis. 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 Therefore, no CEA was recommended and the patient was stable for discharge. Plavix was added to aspirin, and statin was started. Echocardiogram is pending.  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 This demonstrated complete occlusion of the right ICA. Discussed with Dr. Merlene Laughter who recommends discharge on statin, DAPT, and neurology follow up in about 1 month. - 2D echocardiogram without cardioembolic source. - SLP cleared for diet, PT recommended outpatient PT.  - Start DAPT until neurology follow up.   - LDL is 163, started lipitor 40mg . Consider repeat testing in 6 weeks and if tolerated and indcated, augmenting.   IDT1DM with hyperglycemia: HbA1c 7.9%, will require tighter control. Hypoglycemic this morning in the setting of not taking anything by mouth.  - Sensitive SSI and continue home lantus. Continue these as she'll be eating again, unlikely to return to hypoglycemia given her home regimen and elevated A1c.   Diabetic nephropathy with stage III CKD: CrCl 46ml/min. - Avoid nephrotoxins as able.  History of HTN: Reports not being on any medications for this.  - Will not start medications at this time for permissive HTN, but will need longterm control.   Hypothyroidism: TSH 1.433.  - Continue stable dose synthroid.  Discharge Instructions Discharge Instructions    Diet - low sodium heart healthy   Complete by:  As directed    Diet Carb Modified   Complete by:  As directed    Discharge instructions   Complete by:  As directed    You were admitted for evaluation of stroke. Fortunately your symptoms are mild and you can continue with outpatient physical therapy to address them. You will need to follow up with your doctors closely and control your blood sugar and blood pressure very well to minimize your risk of future strokes.  - Take lipitor 40mg  daily to bring down your cholesterol - Take the aspirin 81mg   as you were and add plavix 75mg  daily to this.  - Follow up with neurology, Dr. Merlene Laughter in the next 1 month. - Seek medical attention right away if you notice  any new numbness, weakness, speech changes, or bleeding   Increase activity slowly   Complete by:  As directed      Allergies as of 05/04/2018      Reactions   Fosamax [alendronate Sodium] Other (See Comments)   MUSCLE ACHES   Prednisone    Feels jittery and nauseous      Medication List    TAKE these medications   aspirin EC 81 MG tablet Take 81 mg by mouth daily.   atorvastatin 40 MG tablet Commonly known as:  LIPITOR Take 1 tablet (40 mg total) by mouth daily at 6 PM.   CALCIUM 600 + D PO Take 1 tablet by mouth daily.   clopidogrel 75 MG tablet Commonly known as:  PLAVIX Take 1 tablet (75 mg total) by mouth daily with breakfast. Start taking on:  May 05, 2018   Fish Oil 1000 MG Caps Take 1,000 mg by mouth daily.   Insulin Glargine 300 UNIT/ML Sopn Commonly known as:  TOUJEO MAX SOLOSTAR Inject 22 Units into the skin daily. What changed:    how much to take  additional instructions   insulin lispro 100 UNIT/ML injection Commonly known as:  HUMALOG Inject 4 Units into the skin 2 (two) times daily.   levothyroxine 88 MCG tablet Commonly known as:  SYNTHROID, LEVOTHROID Take 88 mcg by mouth daily before breakfast.   multivitamin with minerals Tabs tablet Take 1 tablet by mouth daily.   multivitamin-lutein Caps capsule Take 1 capsule by mouth daily.   vitamin B-12 1000 MCG tablet Commonly known as:  CYANOCOBALAMIN Take 1,000 mcg by mouth daily.      Follow-up Information    Sinda Du, MD Follow up.   Specialty:  Pulmonary Disease Contact information: [address] [place] [address] [phone]        Burnard Bunting, MD. Schedule an appointment as soon as possible for a visit.   Specialty:  Internal Medicine Why:  for blood pressure and diabetes follow up Contact information: [place] [address] [phone]        Phillips Odor, MD Follow up.   Specialty:  Neurology Why:  Please call to  schedule an appointment in 1 month if you are not given an appointment prior to discharge Contact information: [address] [place] [facility] [address] [phone]          Allergies  Allergen Reactions  . Fosamax [Alendronate Sodium] Other (See Comments)    MUSCLE ACHES  . Prednisone     Feels jittery and nauseous    Consultations:  Neurology, [place]  Procedures/Studies: Ct Head Wo Contrast  Result Date: 05/03/2018 CLINICAL DATA:  84 year old female with left-sided weakness, tremors and short-term memory loss for 2-3 days. Initial encounter. EXAM: CT HEAD WITHOUT CONTRAST TECHNIQUE: Contiguous axial images were obtained from the base of the skull through the vertex without intravenous contrast. COMPARISON:  02/05/2017 head CT. FINDINGS: Brain: Acute/subacute moderate size infarct right frontal lobe/anterior right operculum region also involving the right caudate head and anterior aspect of the anterior limb of the right internal capsule. No associated hemorrhage. Local mass effect upon the right lateral ventricle. Chronic microvascular changes. Mild global atrophy. No intracranial mass lesion noted on this unenhanced exam. Vascular: Hyperdense right middle cerebral artery branch vessels consistent with  acute infarct. Cavernous segment carotid artery calcified plaque. Skull: No acute abnormality. Sinuses/Orbits: Post lens replacement. No acute orbital abnormality. Visualized paranasal sinuses clear. Other: Mastoid air cells and middle ear cavities are clear. IMPRESSION: 1. Acute/subacute moderate size infarct right frontal lobe/anterior right operculum region also involving the right caudate head and anterior aspect of the anterior limb of the right internal capsule. No associated hemorrhage. Mild local mass effect upon the right lateral ventricle. Minimal bowing of the septum to the left without significant change/midline shift 2. Hyperdense right middle cerebral artery branch vessels  consistent with acute infarct. 3. Chronic microvascular changes. Mild global atrophy. These results will be called to the ordering clinician or representative by the Radiologist Assistant, and communication documented in the PACS or zVision Dashboard. Electronically Signed   By: Genia Del M.D.   On: 05/03/2018 13:15   Mr Jodene Nam Head Wo Contrast  Result Date: 05/03/2018 CLINICAL DATA:  Left-sided weakness, tremors, and memory loss for 2-3 days. EXAM: MRI HEAD WITHOUT AND WITH CONTRAST MRA HEAD WITHOUT CONTRAST MRA NECK WITHOUT AND WITH CONTRAST TECHNIQUE: Multiplanar, multiecho pulse sequences of the brain and surrounding structures were obtained without and with intravenous contrast. Angiographic images of the Circle of Willis were obtained using MRA technique without intravenous contrast. Angiographic images of the neck were obtained using MRA technique without and with intravenous contrast. Carotid stenosis measurements (when applicable) are obtained utilizing NASCET criteria, using the distal internal carotid diameter as the denominator. CONTRAST:  5 mL Gadavist COMPARISON:  Head CT 05/03/2018 FINDINGS: MRI HEAD FINDINGS Brain: There is a moderate-sized acute to early subacute right MCA territory infarct involving the frontal operculum, insula, and basal ganglia. A few punctate foci of diffusion abnormality and enhancement in the right parietal lobe are compatible with early subacute infarcts. There is a small chronic infarct involving the white matter of the posterior right frontal lobe. Scattered T2 hyperintensities elsewhere in the cerebral white matter bilaterally are nonspecific but compatible with mild chronic small vessel ischemic disease. Mild cerebral atrophy is within normal limits for age. An avidly enhancing extra-axial mass along the falx in the anterior left frontal region measures 25 x 27 x 36 mm (transverse x AP x craniocaudal) with a small amount of cystic change centrally. There is local  mass effect on the left frontal lobe without edema. No intracranial hemorrhage or extra-axial fluid collection is present. Vascular: Abnormal appearance of the distal right ICA, more fully evaluated below. Skull and upper cervical spine: Unremarkable bone marrow signal. Sinuses/Orbits: Bilateral cataract extraction. Paranasal sinuses and mastoid air cells are clear. Other: None. MRA HEAD FINDINGS The visualized distal vertebral arteries are patent to the basilar with the left being dominant. The basilar artery is widely patent. There is a large left posterior communicating artery with mildly hypoplastic left P1 segment. Moderate to severe right and severe left P2 stenoses are noted. The left internal carotid artery is patent from skull base to carotid terminus with at most mild stenosis at the level of the anterior genu. The right ICA is occluded proximally with reconstitution of the terminus. There is evidence of poor flow in the right MCA with near complete signal loss in the distal M1 and proximal M2 segments although thready enhancement is present in these regions on the postcontrast neck MRA. The right ACA is patent with evidence of less pronounced reduced flow proximally compared to the MCA. The left ACA and left MCA are patent with mild branch vessel atherosclerotic changes but no evidence  of significant proximal stenosis. No aneurysm is identified. MRA NECK FINDINGS There is a standard 3 vessel aortic arch. The brachiocephalic and subclavian arteries are widely patent. The common carotid arteries are patent without evidence of significant stenosis. There is a severe, nearly occlusive stenosis involving the right ICA origin over a length of 5 mm. The right ICA distal to this is patent but small with reduced flow and progressive tapering, and the right ICA occludes completely at the skull base with distal intracranial reconstitution. The right external carotid artery origin is mildly narrowed. There is 50%  stenosis of the proximal left ICA, and there is also mild narrowing of the left ECA origin. The vertebral arteries are patent with antegrade flow bilaterally. The left vertebral artery is slightly dominant. There is mild stenosis versus artifact in both proximal V1 segments with wide patency of the vertebral arteries more distally. IMPRESSION: 1. Moderate-sized acute to early subacute right MCA infarct. 2. Punctate early subacute right parietal infarcts. 3. Mild chronic small vessel ischemic disease. 4. 3.6 cm left anterior parafalcine meningioma without edema. 5. Severe, nearly completely occlusive stenosis of the right ICA origin with diminished distal flow, complete occlusion at the skull base, and distal intracranial reconstitution. 6. Poor flow in the right MCA. 7. 50% proximal left ICA stenosis. 8. Severe bilateral P2 stenoses. Electronically Signed   By: Logan Bores M.D.   On: 05/03/2018 16:22   Mr Angiogram Neck W Or Wo Contrast  Result Date: 05/03/2018 CLINICAL DATA:  Left-sided weakness, tremors, and memory loss for 2-3 days. EXAM: MRI HEAD WITHOUT AND WITH CONTRAST MRA HEAD WITHOUT CONTRAST MRA NECK WITHOUT AND WITH CONTRAST TECHNIQUE: Multiplanar, multiecho pulse sequences of the brain and surrounding structures were obtained without and with intravenous contrast. Angiographic images of the Circle of Willis were obtained using MRA technique without intravenous contrast. Angiographic images of the neck were obtained using MRA technique without and with intravenous contrast. Carotid stenosis measurements (when applicable) are obtained utilizing NASCET criteria, using the distal internal carotid diameter as the denominator. CONTRAST:  5 mL Gadavist COMPARISON:  Head CT 05/03/2018 FINDINGS: MRI HEAD FINDINGS Brain: There is a moderate-sized acute to early subacute right MCA territory infarct involving the frontal operculum, insula, and basal ganglia. A few punctate foci of diffusion abnormality and  enhancement in the right parietal lobe are compatible with early subacute infarcts. There is a small chronic infarct involving the white matter of the posterior right frontal lobe. Scattered T2 hyperintensities elsewhere in the cerebral white matter bilaterally are nonspecific but compatible with mild chronic small vessel ischemic disease. Mild cerebral atrophy is within normal limits for age. An avidly enhancing extra-axial mass along the falx in the anterior left frontal region measures 25 x 27 x 36 mm (transverse x AP x craniocaudal) with a small amount of cystic change centrally. There is local mass effect on the left frontal lobe without edema. No intracranial hemorrhage or extra-axial fluid collection is present. Vascular: Abnormal appearance of the distal right ICA, more fully evaluated below. Skull and upper cervical spine: Unremarkable bone marrow signal. Sinuses/Orbits: Bilateral cataract extraction. Paranasal sinuses and mastoid air cells are clear. Other: None. MRA HEAD FINDINGS The visualized distal vertebral arteries are patent to the basilar with the left being dominant. The basilar artery is widely patent. There is a large left posterior communicating artery with mildly hypoplastic left P1 segment. Moderate to severe right and severe left P2 stenoses are noted. The left internal carotid artery is patent from  skull base to carotid terminus with at most mild stenosis at the level of the anterior genu. The right ICA is occluded proximally with reconstitution of the terminus. There is evidence of poor flow in the right MCA with near complete signal loss in the distal M1 and proximal M2 segments although thready enhancement is present in these regions on the postcontrast neck MRA. The right ACA is patent with evidence of less pronounced reduced flow proximally compared to the MCA. The left ACA and left MCA are patent with mild branch vessel atherosclerotic changes but no evidence of significant proximal  stenosis. No aneurysm is identified. MRA NECK FINDINGS There is a standard 3 vessel aortic arch. The brachiocephalic and subclavian arteries are widely patent. The common carotid arteries are patent without evidence of significant stenosis. There is a severe, nearly occlusive stenosis involving the right ICA origin over a length of 5 mm. The right ICA distal to this is patent but small with reduced flow and progressive tapering, and the right ICA occludes completely at the skull base with distal intracranial reconstitution. The right external carotid artery origin is mildly narrowed. There is 50% stenosis of the proximal left ICA, and there is also mild narrowing of the left ECA origin. The vertebral arteries are patent with antegrade flow bilaterally. The left vertebral artery is slightly dominant. There is mild stenosis versus artifact in both proximal V1 segments with wide patency of the vertebral arteries more distally. IMPRESSION: 1. Moderate-sized acute to early subacute right MCA infarct. 2. Punctate early subacute right parietal infarcts. 3. Mild chronic small vessel ischemic disease. 4. 3.6 cm left anterior parafalcine meningioma without edema. 5. Severe, nearly completely occlusive stenosis of the right ICA origin with diminished distal flow, complete occlusion at the skull base, and distal intracranial reconstitution. 6. Poor flow in the right MCA. 7. 50% proximal left ICA stenosis. 8. Severe bilateral P2 stenoses. Electronically Signed   By: Logan Bores M.D.   On: 05/03/2018 16:22   Mr Jeri Cos FA Contrast  Result Date: 05/03/2018 CLINICAL DATA:  Left-sided weakness, tremors, and memory loss for 2-3 days. EXAM: MRI HEAD WITHOUT AND WITH CONTRAST MRA HEAD WITHOUT CONTRAST MRA NECK WITHOUT AND WITH CONTRAST TECHNIQUE: Multiplanar, multiecho pulse sequences of the brain and surrounding structures were obtained without and with intravenous contrast. Angiographic images of the Circle of Willis were  obtained using MRA technique without intravenous contrast. Angiographic images of the neck were obtained using MRA technique without and with intravenous contrast. Carotid stenosis measurements (when applicable) are obtained utilizing NASCET criteria, using the distal internal carotid diameter as the denominator. CONTRAST:  5 mL Gadavist COMPARISON:  Head CT 05/03/2018 FINDINGS: MRI HEAD FINDINGS Brain: There is a moderate-sized acute to early subacute right MCA territory infarct involving the frontal operculum, insula, and basal ganglia. A few punctate foci of diffusion abnormality and enhancement in the right parietal lobe are compatible with early subacute infarcts. There is a small chronic infarct involving the white matter of the posterior right frontal lobe. Scattered T2 hyperintensities elsewhere in the cerebral white matter bilaterally are nonspecific but compatible with mild chronic small vessel ischemic disease. Mild cerebral atrophy is within normal limits for age. An avidly enhancing extra-axial mass along the falx in the anterior left frontal region measures 25 x 27 x 36 mm (transverse x AP x craniocaudal) with a small amount of cystic change centrally. There is local mass effect on the left frontal lobe without edema. No intracranial hemorrhage or extra-axial  fluid collection is present. Vascular: Abnormal appearance of the distal right ICA, more fully evaluated below. Skull and upper cervical spine: Unremarkable bone marrow signal. Sinuses/Orbits: Bilateral cataract extraction. Paranasal sinuses and mastoid air cells are clear. Other: None. MRA HEAD FINDINGS The visualized distal vertebral arteries are patent to the basilar with the left being dominant. The basilar artery is widely patent. There is a large left posterior communicating artery with mildly hypoplastic left P1 segment. Moderate to severe right and severe left P2 stenoses are noted. The left internal carotid artery is patent from skull  base to carotid terminus with at most mild stenosis at the level of the anterior genu. The right ICA is occluded proximally with reconstitution of the terminus. There is evidence of poor flow in the right MCA with near complete signal loss in the distal M1 and proximal M2 segments although thready enhancement is present in these regions on the postcontrast neck MRA. The right ACA is patent with evidence of less pronounced reduced flow proximally compared to the MCA. The left ACA and left MCA are patent with mild branch vessel atherosclerotic changes but no evidence of significant proximal stenosis. No aneurysm is identified. MRA NECK FINDINGS There is a standard 3 vessel aortic arch. The brachiocephalic and subclavian arteries are widely patent. The common carotid arteries are patent without evidence of significant stenosis. There is a severe, nearly occlusive stenosis involving the right ICA origin over a length of 5 mm. The right ICA distal to this is patent but small with reduced flow and progressive tapering, and the right ICA occludes completely at the skull base with distal intracranial reconstitution. The right external carotid artery origin is mildly narrowed. There is 50% stenosis of the proximal left ICA, and there is also mild narrowing of the left ECA origin. The vertebral arteries are patent with antegrade flow bilaterally. The left vertebral artery is slightly dominant. There is mild stenosis versus artifact in both proximal V1 segments with wide patency of the vertebral arteries more distally. IMPRESSION: 1. Moderate-sized acute to early subacute right MCA infarct. 2. Punctate early subacute right parietal infarcts. 3. Mild chronic small vessel ischemic disease. 4. 3.6 cm left anterior parafalcine meningioma without edema. 5. Severe, nearly completely occlusive stenosis of the right ICA origin with diminished distal flow, complete occlusion at the skull base, and distal intracranial reconstitution. 6.  Poor flow in the right MCA. 7. 50% proximal left ICA stenosis. 8. Severe bilateral P2 stenoses. Electronically Signed   By: Logan Bores M.D.   On: 05/03/2018 16:22   US Carotid Bilateral  Result Date: 05/04/2018 CLINICAL DATA:  Left-sided weakness, memory loss, recent right MCA distribution stroke, diabetes, previous tobacco abuse EXAM: BILATERAL CAROTID DUPLEX ULTRASOUND TECHNIQUE: Pearline Cables scale imaging, color Doppler and duplex ultrasound were performed of bilateral carotid and vertebral arteries in the neck. COMPARISON:  MRA 05/03/2018 FINDINGS: Criteria: Quantification of carotid stenosis is based on velocity parameters that correlate the residual internal carotid diameter with NASCET-based stenosis levels, using the diameter of the distal internal carotid lumen as the denominator for stenosis measurement. The following velocity measurements were obtained: RIGHT ICA: Not Seen CCA: 24/4 cm/sec SYSTOLIC ICA/CCA RATIO:  Not calculable ECA: 159 cm/sec LEFT ICA: 246/76 cm/sec CCA: 01/02 cm/sec SYSTOLIC ICA/CCA RATIO:  3.2 ECA: 157 cm/sec RIGHT CAROTID ARTERY: Relatively high resistance waveform in the common carotid artery. Heavily calcified plaque in the bulb. Apparent origin stenosis of the ICA with no evidence of string sign. Scattered plaque in the external  carotid artery with normal waveforms and color Doppler signal RIGHT VERTEBRAL ARTERY:  Normal flow direction and waveform. LEFT CAROTID ARTERY: Eccentric partially calcified plaque in the carotid bulb and proximal ICA, resulting in some distal acoustic shadowing. Elevated peak systolic velocities in the proximal ICA just distal to the plaque without focal aliasing on color Doppler. Elsewhere normal waveforms and color Doppler signal. LEFT VERTEBRAL ARTERY:  Normal flow direction and waveform. IMPRESSION: 1. Apparent origin occlusion of the right ICA, without evidence of string sign. 2. Left carotid bifurcation and proximal ICA plaque resulting in 50-69%  diameter stenosis. 3.  Antegrade bilateral vertebral arterial flow. Electronically Signed   By: Lucrezia Europe M.D.   On: 05/04/2018 14:44   Mm 3d Screen Breast W/implant Bilateral  Result Date: 04/08/2018 CLINICAL DATA:  Screening. EXAM: DIGITAL SCREENING BILATERAL MAMMOGRAM WITH IMPLANTS, CAD AND TOMO The patient has retropectoral implants. Standard and implant displaced views were performed. COMPARISON:  Previous exam(s). ACR Breast Density Category b: There are scattered areas of fibroglandular density. FINDINGS: There are no findings suspicious for malignancy. Images were processed with CAD. IMPRESSION: No mammographic evidence of malignancy. A result letter of this screening mammogram will be mailed directly to the patient. RECOMMENDATION: Screening mammogram in one year. (Code:[id number]) BI-RADS CATEGORY  1:  Negative. Electronically Signed   By: Lajean Manes M.D.   On: 04/08/2018 10:56   Xr Shoulder Right  Result Date: 04/24/2018 AP lateral right shoulder reviewed.  Reverse shoulder replacement in good position alignment with no complicating features.  No fracture dislocation present.  Subjective: Feels that left arm strength has improved. She remains unsteady on her feet, though she's had severe falls in the past and attributes some reluctance to walk independently to that. Felt good walking with PT today with a cane. No new numbness, weakness, speech changes.   Discharge Exam: Vitals:   05/04/18 1045 05/04/18 1445  BP: (!) 140/45 (!) 136/52  Pulse: 75 76  Resp: 16 18  Temp: 98.3 F (36.8 C) 98.6 F (37 C)  SpO2: 99% 99%   Gen: Elderly, well-appearing female in no distress Neck: Bruit on left carotid Pulm: Non-labored breathing room air. Clear to auscultation bilaterally.  CV: Regular rate and rhythm. No murmur, rub, or gallop. No JVD, no pedal edema. GI: Abdomen soft, non-tender, non-distended, with normoactive bowel sounds. No organomegaly or masses felt. Ext: Warm, no  deformities Skin: No rashes, lesions or ulcers Neuro: Alert and oriented. Very subtle left arm shoulder > elbow > grip weakness (still 4+/5) and narrow based, slow gait noted. No other focal neurological deficits. Psych: Judgement and insight appear normal. Mood & affect appropriate.   Labs: Basic Metabolic Panel: Recent Labs  Lab 05/03/18 1357 05/04/18 0431  NA 134* 142  K 3.8 3.7  CL 103 110  CO2 23 25  GLUCOSE 401* 40*  BUN 22 22  CREATININE 0.94 0.74  CALCIUM 9.0 9.3   Liver Function Tests: Recent Labs  Lab 05/03/18 1357  AST 22  ALT 16  ALKPHOS 62  BILITOT 0.7  PROT 5.9*  ALBUMIN 3.6   CBC: Recent Labs  Lab 05/03/18 1357  WBC 7.5  NEUTROABS 4.5  HGB 13.1  HCT 40.2  MCV 92.6  PLT 252   Cardiac Enzymes: Recent Labs  Lab 05/03/18 1357  TROPONINI <0.03   CBG: Recent Labs  Lab 05/03/18 2110 05/04/18 0654 05/04/18 0708 05/04/18 0752 05/04/18 1132  GLUCAP 268* 35* 224* 196* 335*   Hgb A1c Recent Labs  05/03/18 1357  HGBA1C 7.9*   Lipid Profile Recent Labs    05/04/18 0431  CHOL 273*  HDL 101  LDLCALC 163*  TRIG 44  CHOLHDL 2.7   Thyroid function studies Recent Labs    05/03/18 1357  TSH 1.433   Urinalysis    Component Value Date/Time   COLORURINE YELLOW 05/03/2018 1220   APPEARANCEUR CLEAR 05/03/2018 1220   LABSPEC 1.032 (H) 05/03/2018 1220   PHURINE 5.0 05/03/2018 1220   GLUCOSEU >=500 (A) 05/03/2018 1220   HGBUR NEGATIVE 05/03/2018 1220   BILIRUBINUR NEGATIVE 05/03/2018 1220   KETONESUR NEGATIVE 05/03/2018 1220   PROTEINUR NEGATIVE 05/03/2018 1220   NITRITE NEGATIVE 05/03/2018 1220   LEUKOCYTESUR TRACE (A) 05/03/2018 1220    Time coordinating discharge: Approximately 40 minutes  Patrecia Pour, MD  [facility] 05/04/2018, 3:30 PM Pager [phone]

## 2018-05-04 NOTE — Progress Notes (Signed)
*  PRELIMINARY RESULTS* Echocardiogram 2D Echocardiogram has been performed.  Leavy Cella 05/04/2018, 2:49 PM

## 2018-05-04 NOTE — Evaluation (Signed)
Clinical/Bedside Swallow Evaluation Patient Details  Name: Danielle [name] MRN: [id number] Date of Birth: 1932/04/23  Today's Date: 05/04/2018 Time: SLP Start Time (ACUTE ONLY): 4098 SLP Stop Time (ACUTE ONLY): 0928 SLP Time Calculation (min) (ACUTE ONLY): 24 min  Past Medical History:  Past Medical History:  Diagnosis Date  . Arthritis    "some joint pain once in awhile" (05/26/2017)  . History of kidney stones   . Hypertension   . Hypothyroidism (acquired)   . Pneumothorax 02/05/2017   right-after fall  . Rib fractures 02/05/2017   right side  . Type II diabetes mellitus ([place])    Past Surgical History:  Past Surgical History:  Procedure Laterality Date  . AUGMENTATION MAMMAPLASTY Bilateral   . DILATION AND CURETTAGE OF UTERUS    . FRACTURE SURGERY    . REVERSE SHOULDER ARTHROPLASTY Right 05/26/2017  . REVERSE SHOULDER ARTHROPLASTY Right 05/26/2017   Procedure: REVERSE RIGHT SHOULDER ARTHROPLASTY;  Surgeon: Meredith Pel, MD;  Location: [facility];  Service: Orthopedics;  Laterality: Right;  . TONSILLECTOMY    . TUBAL LIGATION     HPI:  Danielle [name] is an 84 year old female with a history of IDT2DM and hypothyroidism who was brought to the ED on her husband's insistence due to 3 days of left arm weakness, confusion, and difficulty walking. She noticed the gradual onset of left arm weakness that was mild but constant and unchanged through today, no aggravating or alleviating factors. Her husband has also noticed that she's slightly confused compared to her normal baseline and he has had to help her to walk due to imbalance since that time. They were going to go to [facility] tomorrow but when symptoms persisted they presented for evaluation. MRI positive for acute CVA.    Assessment / Plan / Recommendation Clinical Impression  SLP provided clinical swallowing evaluation with Pt seated upright in chair. Oral mechanism exam reveals slightly decreased Left facial  symmetry (visualized when Pt smiles). Pt consumed regular textures, puree textures and thin liquids without overt s/sx of oropharyngeal dysphagia. Pt was pleasant and cooperative; she reports occasionally getting "choked or strangled" but this happens infrequently. Symptoms began ~2 days prior to admission and Pt reports no swallowing difficulty or changes in speech since that time. SLP reviewed universal aspiration precautions. There are no further ST needs noted at this time, ST to sign off. Thank you for this referral.  SLP Visit Diagnosis: Dysphagia, unspecified (R13.10)    Aspiration Risk  Mild aspiration risk    Diet Recommendation Regular;Thin liquid   Liquid Administration via: Cup;Straw Medication Administration: Whole meds with liquid Supervision: Patient able to self feed Compensations: Minimize environmental distractions;Slow rate;Small sips/bites Postural Changes: Seated upright at 90 degrees;Remain upright for at least 30 minutes after po intake    Other  Recommendations Oral Care Recommendations: Oral care BID   Follow up Recommendations None        Swallow Study   General Date of Onset: 05/03/18 HPI: Danielle [name] is an 84 year old female with a history of IDT2DM and hypothyroidism who was brought to the ED on her husband's insistence due to 3 days of left arm weakness, confusion, and difficulty walking. She noticed the gradual onset of left arm weakness that was mild but constant and unchanged through today, no aggravating or alleviating factors. Her husband has also noticed that she's slightly confused compared to her normal baseline and he has had to help her to walk due to imbalance  since that time. They were going to go to [facility] tomorrow but when symptoms persisted they presented for evaluation. MRI positive for acute CVA.  Type of Study: Bedside Swallow Evaluation Previous Swallow Assessment: none in chart Diet Prior to this Study: NPO Temperature Spikes  Noted: No Respiratory Status: Room air History of Recent Intubation: No Behavior/Cognition: Alert;Cooperative;Pleasant mood Oral Cavity Assessment: Within Functional Limits Oral Care Completed by SLP: Yes Oral Cavity - Dentition: Adequate natural dentition Vision: Functional for self-feeding Self-Feeding Abilities: Able to feed self Patient Positioning: Upright in chair Baseline Vocal Quality: Normal Volitional Cough: Strong Volitional Swallow: Able to elicit    Oral/Motor/Sensory Function Overall Oral Motor/Sensory Function: Within functional limits   Ice Chips Ice chips: Within functional limits   Thin Liquid Thin Liquid: Within functional limits    Nectar Thick Nectar Thick Liquid: Not tested   Honey Thick Honey Thick Liquid: Not tested   Puree Puree: Within functional limits   Solid     Solid: Within functional limits     Inez Stantz H. Roddie Mc, CCC-SLP Speech Language Pathologist  Wende Bushy 05/04/2018,9:29 AM

## 2018-05-04 NOTE — Care Management (Signed)
CM consulted for PT referral. Pt okay with recommended OP PT. Would like to go to facility in [place]. Referral sent to [facility] OP PT. No other DC needs communicated.

## 2018-05-05 LAB — GLUCOSE, CAPILLARY: Glucose-Capillary: 53 mg/dL — ABNORMAL LOW (ref 70–99)

## 2018-05-10 ENCOUNTER — Encounter (HOSPITAL_COMMUNITY): Payer: Self-pay

## 2018-05-10 ENCOUNTER — Ambulatory Visit (HOSPITAL_COMMUNITY): Payer: Medicare Other | Attending: Family Medicine

## 2018-05-10 ENCOUNTER — Other Ambulatory Visit: Payer: Self-pay

## 2018-05-10 DIAGNOSIS — M6281 Muscle weakness (generalized): Secondary | ICD-10-CM | POA: Diagnosis present

## 2018-05-10 DIAGNOSIS — R2681 Unsteadiness on feet: Secondary | ICD-10-CM | POA: Insufficient documentation

## 2018-05-10 LAB — GLUCOSE, CAPILLARY: Glucose-Capillary: 214 mg/dL — ABNORMAL HIGH (ref 70–99)

## 2018-05-10 NOTE — Therapy (Signed)
[facility] [place], [place], [address] Phone: [phone]   Fax:  [phone]  Physical Therapy Evaluation  Patient Details  Name: Danielle [name] MRN: [id number] Date of Birth: Apr 10, 1932 Referring Provider (PT): Patrecia Pour, MD   Encounter Date: 05/10/2018  PT End of Session - 05/10/18 1159    Visit Number  1    Number of Visits  5    Date for PT Re-Evaluation  06/07/18    Authorization Type  [employer] Medicare    Authorization Time Period  05/10/18 to 06/07/18    Authorization - Visit Number  1    Authorization - Number of Visits  10    PT Start Time  1112    PT Stop Time  1155    PT Time Calculation (min)  43 min    Equipment Utilized During Treatment  Gait belt    Activity Tolerance  Patient tolerated treatment well    Behavior During Therapy  [facility] for tasks assessed/performed       Past Medical History:  Diagnosis Date  . Arthritis    "some joint pain once in awhile" (05/26/2017)  . History of kidney stones   . Hypertension   . Hypothyroidism (acquired)   . Pneumothorax 02/05/2017   right-after fall  . Rib fractures 02/05/2017   right side  . Type II diabetes mellitus ([place])     Past Surgical History:  Procedure Laterality Date  . AUGMENTATION MAMMAPLASTY Bilateral   . DILATION AND CURETTAGE OF UTERUS    . FRACTURE SURGERY    . REVERSE SHOULDER ARTHROPLASTY Right 05/26/2017  . REVERSE SHOULDER ARTHROPLASTY Right 05/26/2017   Procedure: REVERSE RIGHT SHOULDER ARTHROPLASTY;  Surgeon: Meredith Pel, MD;  Location: [place];  Service: Orthopedics;  Laterality: Right;  . TONSILLECTOMY    . TUBAL LIGATION      There were no vitals filed for this visit.   Subjective Assessment - 05/10/18 1116    Subjective  Pt states that she had been walking wobbly/unsteady like so she went to the ER where they took CT/MRI imaging which showed acute to subacute CVA. It was due to a clot but she and the hospitalist don't  know what caused the clot. She states that she feels her walking has not changed since the stroke. No falls in the last 6 months and no close calls. She is having the most difficulty with her hands feeling weak. She denies any pain, n/t, or peripheral neuropathy (from DM).     How long can you sit comfortably?  no issues    How long can you stand comfortably?  pretty good while    How long can you walk comfortably?  limited community ambulator when good weather; self-limits her walking due to fear of falling from her fall 04/25/17    Patient Stated Goals  get better    Currently in Pain?  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 Bonner Puna yet    Prior Therapy  OT for shoulder earlier this year      Balance Screen   Has the patient  fallen in the past 6 months  No    Has the patient had a decrease in activity level because of a fear of falling?   Yes    Is the patient reluctant to leave their home because of a fear of falling?   Yes      Prior Function   Level of Independence  Independent    Vocation  Retired    Leisure  go out to eat, go to the movies      Cognition   Overall Cognitive Status  Within Functional Limits for tasks assessed      Observation/Other Assessments   Focus on Therapeutic Outcomes (FOTO)   to be completed next visit      Sensation   Light Touch  Appears Intact      Coordination   Heel Shin Test  WNL BLE    9 Hole Peg Test  RAMPs WNL BLE      ROM / Strength   AROM / PROM / Strength  Strength      Strength   Strength Assessment Site  Hip;Knee;Ankle;Hand    Right Hand Grip (lbs)  35    Left Hand Grip (lbs)  21    Right Hip Flexion  5/5    Right Hip Extension  3+/5    Right Hip ABduction  4/5    Left Hip Flexion   4+/5    Left Hip Extension  3+/5    Left Hip ABduction  4/5    Right Knee Flexion  4+/5    Right Knee Extension  5/5    Left Knee Flexion  4/5    Left Knee Extension  4+/5    Right Ankle Dorsiflexion  5/5    Left Ankle Dorsiflexion  5/5      Ambulation/Gait   Ambulation Distance (Feet)  --      Balance   Balance Assessed  Yes      Static Standing Balance   Static Standing - Balance Support  No upper extremity supported    Static Standing Balance -  Activities   Single Leg Stance - Right Leg;Single Leg Stance - Left Leg;Tandam Stance - Right Leg;Tandam Stance - Left Leg    Static Standing - Comment/# of Minutes  SLS - R: 2sec or < L: 3sec;   tandem stance - BLE back: 2sec or < before requiring UE assistance      Standardized Balance Assessment   Standardized Balance Assessment  Five Times Sit to Stand;Dynamic Gait Index    Five times sit to stand comments   8.2sec, chair, no UE, slightly unsteady      Dynamic Gait Index   Level Surface  Normal    Change in Gait Speed  Mild Impairment    Gait with Horizontal Head Turns  Mild Impairment    Gait with Vertical Head Turns  Mild Impairment    Gait and Pivot Turn  Normal    Step Over Obstacle  Normal    Step Around Obstacles  Normal    Steps  Mild Impairment    Total Score  20      Functional Gait  Assessment   Gait assessed   Yes    Gait Level Surface  Walks 20 ft in less than 5.5 sec, no assistive devices, good speed, no evidence for imbalance, normal gait pattern, deviates no more than 6 in outside of the 12 in walkway width.    Change in Gait Speed  Able to  change speed, demonstrates mild gait deviations, deviates 6-10 in outside of the 12 in walkway width, or no gait deviations, unable to achieve a major change in velocity, or uses a change in velocity, or uses an assistive device.    Gait with Horizontal Head Turns  Performs head turns smoothly with slight change in gait velocity (eg, minor disruption to smooth gait path),  deviates 6-10 in outside 12 in walkway width, or uses an assistive device.    Gait with Vertical Head Turns  Performs task with slight change in gait velocity (eg, minor disruption to smooth gait path), deviates 6 - 10 in outside 12 in walkway width or uses assistive device    Gait and Pivot Turn  Pivot turns safely within 3 sec and stops quickly with no loss of balance.    Step Over Obstacle  Is able to step over 2 stacked shoe boxes taped together (9 in total height) without changing gait speed. No evidence of imbalance.    Gait with Narrow Base of Support  Ambulates less than 4 steps heel to toe or cannot perform without assistance.    Gait with Eyes Closed  Walks 20 ft, no assistive devices, good speed, no evidence of imbalance, normal gait pattern, deviates no more than 6 in outside 12 in walkway width. Ambulates 20 ft in less than 7 sec.    Ambulating Backwards  Walks 20 ft, uses assistive device, slower speed, mild gait deviations, deviates 6-10 in outside 12 in walkway width.    Steps  Alternating feet, must use rail.    Total Score  22           Objective measurements completed on examination: See above findings.       PT Education - 05/10/18 1158    Education Details  exam findings, HEP, POC    Person(s) Educated  Patient    Methods  Explanation;Demonstration;Handout    Comprehension  Verbalized understanding;Returned demonstration       PT Short Term Goals - 05/10/18 1210      PT SHORT TERM GOAL #1   Title  Pt will be independent with HEP and perform consistently in order to improve strength and balance.     Time  2    Period  Weeks    Status  New    Target Date  05/24/18      PT SHORT TERM GOAL #2   Title  Pt will be able to perform bil SLS for 10 sec or > without UE support to demo improved balance and maximize her gait to reduce risk for falls.     Time  2    Period  Weeks    Status  New        PT Long Term Goals - 05/10/18 1210      PT LONG TERM GOAL  #1   Title  Pt will have improved bilateral proximal hip extension MMT to 4/5 and hip abd MMT to 4+/5 in order to maximize her balance and gait.     Time  4    Period  Weeks    Status  New    Target Date  06/07/18      PT LONG TERM GOAL #2   Title  Pt will be able to perform bil tandem stance for 10 sec or > without UE support in order to demo improved functional strength and balance.    Time  4    Period  Weeks  Status  New      PT LONG TERM GOAL #3   Title  Pt will score 25/30 on the FGA or better to demo improved dynamic balance and reduce her risk for falls during ambulation in the community.     Time  4    Period  Weeks    Status  New             Plan - 05/10/18 1208    Clinical Impression Statement  Pt is pleasant 84YO F who presents to OPPT s/p acute to early subacute CVA on 05/03/18. Per chart review/imaging, pt had moderate-sized acute to early subacute R MCA infarct along with punctate early subacute R parietal infarcts. Pt currently presents with deficits in proximal hip strength, dynamic balance, and dynamic gait. She scored 20/24 on the DGI which is a WNL score, but scored 22/30 on the FGA, which puts her at risk for increased falls. She had the most difficulty with SLS, tandem balance bilaterally, and tandem gait during the FGA. Pt is high-level and PT feels she would benefit from brief PT POC in order to address her limitations noted in order to reduce her risk for falling during gait and dynamic movements.     Clinical Presentation  Stable    Clinical Presentation due to:  see flowsheets    Clinical Decision Making  Low    Rehab Potential  Excellent    PT Frequency  1x / week    PT Duration  4 weeks    PT Treatment/Interventions  ADLs/Self Care Home Management;Aquatic Therapy;Canalith Repostioning;Cryotherapy;Electrical Stimulation;Moist Heat;Traction;DME Instruction;Gait training;Stair training;Functional mobility training;Therapeutic activities;Therapeutic  exercise;Balance training;Neuromuscular re-education;Patient/family education;Manual techniques;Passive range of motion;Dry needling;Energy conservation;Splinting;Taping;Vestibular    PT Next Visit Plan  review goals, administer FOTO, begin functional strengthening and dynamic balance training    PT Home Exercise Plan  eval: bridging, clams, SLS, red putty    Consulted and Agree with Plan of Care  Patient       Patient will benefit from skilled therapeutic intervention in order to improve the following deficits and impairments:  Abnormal gait, Decreased activity tolerance, Decreased balance, Decreased strength, Difficulty walking  Visit Diagnosis: Unsteadiness on feet - Plan: PT plan of care cert/re-cert  Muscle weakness (generalized) - Plan: PT plan of care cert/re-cert     Problem List Patient Active Problem List   Diagnosis Date Noted  . Acute CVA (cerebrovascular accident) ([place]) 05/03/2018  . Hypothyroidism (acquired) 05/03/2018  . Hypertension 05/03/2018  . Uncontrolled type 2 diabetes mellitus with diabetic nephropathy, with long-term current use of insulin ([place])   . UTI due to extended-spectrum beta lactamase (ESBL) producing Escherichia coli   . Pneumothorax 02/05/2017  . Multiple rib fractures 02/05/2017  . Type 2 diabetes mellitus with hypoglycemia without coma ([place]) 02/05/2017  . Clavicle fracture 02/05/2017  . Elevated blood pressure reading 02/05/2017  . UTI (urinary tract infection) 02/05/2017  . Back pain at L4-L5 level 06/14/2013  . Muscle weakness (generalized) 12/17/2012  . Pain in joint, shoulder region 12/17/2012  . Shoulder fracture 12/14/2012  . Proximal humerus fracture 11/29/2012  . FRACTURE, TOE, RIGHT 09/10/2009        Danielle [name] PT, DPT   [facility] [address] [place], [place], [address] Phone: [phone]   Fax:  [phone]  Name: Danielle [name] MRN: [id number] Date of Birth:  Sep 19, 1931

## 2018-05-21 ENCOUNTER — Encounter

## 2018-05-21 ENCOUNTER — Telehealth (HOSPITAL_COMMUNITY): Payer: Self-pay | Admitting: Pulmonary Disease

## 2018-05-21 NOTE — Telephone Encounter (Signed)
05/21/18 pt left a message that she wanted to change this appt. time.  I called her back and left her a message

## 2018-05-24 ENCOUNTER — Other Ambulatory Visit (HOSPITAL_COMMUNITY): Payer: Self-pay | Admitting: Family Medicine

## 2018-05-25 ENCOUNTER — Encounter (HOSPITAL_COMMUNITY): Payer: Self-pay

## 2018-05-25 ENCOUNTER — Ambulatory Visit (HOSPITAL_COMMUNITY): Payer: Medicare Other | Attending: Family Medicine

## 2018-05-25 DIAGNOSIS — M6281 Muscle weakness (generalized): Secondary | ICD-10-CM | POA: Diagnosis present

## 2018-05-25 DIAGNOSIS — R2681 Unsteadiness on feet: Secondary | ICD-10-CM | POA: Diagnosis not present

## 2018-05-25 NOTE — Therapy (Signed)
[place], [place], [address] Phone: [phone]   Fax:  [phone]  Physical Therapy Treatment  Patient Details  Name: Danielle [name] MRN: [id number] Date of Birth: March 14, 1932 Referring Provider (PT): Patrecia Pour, MD   Encounter Date: 05/25/2018  PT End of Session - 05/25/18 1420    Visit Number  2    Number of Visits  5    Date for PT Re-Evaluation  06/07/18    Authorization Type  [employer] Medicare    Authorization Time Period  05/10/18 to 06/07/18    Authorization - Visit Number  2    Authorization - Number of Visits  10    PT Start Time  7867    PT Stop Time  1502    PT Time Calculation (min)  42 min    Equipment Utilized During Treatment  Gait belt    Activity Tolerance  Patient tolerated treatment well    Behavior During Therapy  [facility] for tasks assessed/performed       Past Medical History:  Diagnosis Date  . Arthritis    "some joint pain once in awhile" (05/26/2017)  . History of kidney stones   . Hypertension   . Hypothyroidism (acquired)   . Pneumothorax 02/05/2017   right-after fall  . Rib fractures 02/05/2017   right side  . Type II diabetes mellitus ([place])     Past Surgical History:  Procedure Laterality Date  . AUGMENTATION MAMMAPLASTY Bilateral   . DILATION AND CURETTAGE OF UTERUS    . FRACTURE SURGERY    . REVERSE SHOULDER ARTHROPLASTY Right 05/26/2017  . REVERSE SHOULDER ARTHROPLASTY Right 05/26/2017   Procedure: REVERSE RIGHT SHOULDER ARTHROPLASTY;  Surgeon: Meredith Pel, MD;  Location: [place];  Service: Orthopedics;  Laterality: Right;  . TONSILLECTOMY    . TUBAL LIGATION      There were no vitals filed for this visit.  Subjective Assessment - 05/25/18 1421    Subjective  Pt states that she is doing pretty good. She states that she has been doing her HEP, what little she does. She states that she used her putty a little bit.     How long can you sit comfortably?  no issues     How long can you stand comfortably?  pretty good while    How long can you walk comfortably?  limited community ambulator when good weather; self-limits her walking due to fear of falling from her fall 04/25/17    Patient Stated Goals  get better    Currently in Pain?  No/denies           [facility] PT Assessment - 05/25/18 0001      Observation/Other Assessments   Focus on Therapeutic Outcomes (FOTO)   16% limitation           [facility] Adult PT Treatment/Exercise - 05/25/18 0001      Exercises   Exercises  Knee/Hip      Knee/Hip Exercises: Standing   Heel Raises  Both;20 reps    Heel Raises Limitations  heel and toe    Hip Abduction  Both;3 sets;10 reps    Abduction Limitations  RTB; cues for control    Hip Extension  Both;2 sets;10 reps    Extension Limitations  RTB, cues for control    Other Standing Knee Exercises  sidestepping RTB33ft x2RT       Knee/Hip Exercises: Supine   Bridges  Both;2 sets;10 reps  Knee/Hip Exercises: Sidelying   Clams  BLE x20 reps      Balance Exercises - 05/25/18 1450      Balance Exercises: Standing   Standing Eyes Opened  Narrow base of support (BOS);Head turns;Foam/compliant surface   x10 reps up/down, R/L head turns   Tandem Stance  Foam/compliant surface;Intermittent upper extremity support;3 reps;10 secs    SLS  Eyes open;Solid surface;Intermittent upper extremity support;4 reps;10 secs             PT Education - 05/25/18 1421    Education Details  reviewed goals, exercise technique, continue HEP    Person(s) Educated  Patient    Methods  Explanation;Demonstration    Comprehension  Verbalized understanding;Returned demonstration       PT Short Term Goals - 05/10/18 1210      PT SHORT TERM GOAL #1   Title  Pt will be independent with HEP and perform consistently in order to improve strength and balance.     Time  2    Period  Weeks    Status  New    Target Date  05/24/18      PT SHORT TERM GOAL #2   Title  Pt  will be able to perform bil SLS for 10 sec or > without UE support to demo improved balance and maximize her gait to reduce risk for falls.     Time  2    Period  Weeks    Status  New        PT Long Term Goals - 05/10/18 1210      PT LONG TERM GOAL #1   Title  Pt will have improved bilateral proximal hip extension MMT to 4/5 and hip abd MMT to 4+/5 in order to maximize her balance and gait.     Time  4    Period  Weeks    Status  New    Target Date  06/07/18      PT LONG TERM GOAL #2   Title  Pt will be able to perform bil tandem stance for 10 sec or > without UE support in order to demo improved functional strength and balance.    Time  4    Period  Weeks    Status  New      PT LONG TERM GOAL #3   Title  Pt will score 25/30 on the FGA or better to demo improved dynamic balance and reduce her risk for falls during ambulation in the community.     Time  4    Period  Weeks    Status  New            Plan - 05/25/18 1503    Clinical Impression Statement  Began session by reviewing goals and administering FOTO; pt scoring 16% limitation with functional tasks due to her CVA. Pt's husband told PT prior to starting session that the pt hadn't been doing her exercises at home and all she wants to do is sleep. Today's session focused on functional strengthening and balance. Multiple cues required for pt to slow down and control the exercises. Pt challenged with balance activities and required CGA - min A for steadiness as well as max cues to reduce UE support in order to challenge balance and improve. Provided pt with re-printout of her HEP provided at eval and updated today. Continue as planned, progressing as able.     Rehab Potential  Excellent    PT Frequency  1x / week    PT Duration  4 weeks    PT Treatment/Interventions  ADLs/Self Care Home Management;Aquatic Therapy;Canalith Repostioning;Cryotherapy;Electrical Stimulation;Moist Heat;Traction;DME Instruction;Gait training;Stair  training;Functional mobility training;Therapeutic activities;Therapeutic exercise;Balance training;Neuromuscular re-education;Patient/family education;Manual techniques;Passive range of motion;Dry needling;Energy conservation;Splinting;Taping;Vestibular    PT Next Visit Plan  cotninue  functional strengthening and dynamic balance training    PT Home Exercise Plan  eval: bridging, clams, SLS, red putty; 1/7: hip abd, sidestepping RTB    Consulted and Agree with Plan of Care  Patient       Patient will benefit from skilled therapeutic intervention in order to improve the following deficits and impairments:  Abnormal gait, Decreased activity tolerance, Decreased balance, Decreased strength, Difficulty walking  Visit Diagnosis: Unsteadiness on feet  Muscle weakness (generalized)     Problem List Patient Active Problem List   Diagnosis Date Noted  . Acute CVA (cerebrovascular accident) ([place]) 05/03/2018  . Hypothyroidism (acquired) 05/03/2018  . Hypertension 05/03/2018  . Uncontrolled type 2 diabetes mellitus with diabetic nephropathy, with long-term current use of insulin ([place])   . UTI due to extended-spectrum beta lactamase (ESBL) producing Escherichia coli   . Pneumothorax 02/05/2017  . Multiple rib fractures 02/05/2017  . Type 2 diabetes mellitus with hypoglycemia without coma ([place]) 02/05/2017  . Clavicle fracture 02/05/2017  . Elevated blood pressure reading 02/05/2017  . UTI (urinary tract infection) 02/05/2017  . Back pain at L4-L5 level 06/14/2013  . Muscle weakness (generalized) 12/17/2012  . Pain in joint, shoulder region 12/17/2012  . Shoulder fracture 12/14/2012  . Proximal humerus fracture 11/29/2012  . FRACTURE, TOE, RIGHT 09/10/2009       Geraldine Solar PT, DPT  [facility] [address] [place], [place], [address] Phone: [phone]   Fax:  [phone]  Name: Danielle [name] MRN: [id number] Date of Birth:  01/09/1932

## 2018-05-29 ENCOUNTER — Other Ambulatory Visit (HOSPITAL_COMMUNITY): Payer: Self-pay | Admitting: Family Medicine

## 2018-05-31 NOTE — Telephone Encounter (Signed)
Patient of Dr. Luan Pulling. Hospitalist will not provide refills for medications prescribed at discharge. Please contact Dr. Luan Pulling for refill requests.

## 2018-06-01 ENCOUNTER — Ambulatory Visit (HOSPITAL_COMMUNITY): Payer: Medicare Other

## 2018-06-01 ENCOUNTER — Encounter (HOSPITAL_COMMUNITY): Payer: Self-pay

## 2018-06-01 DIAGNOSIS — R2681 Unsteadiness on feet: Secondary | ICD-10-CM | POA: Diagnosis not present

## 2018-06-01 DIAGNOSIS — M6281 Muscle weakness (generalized): Secondary | ICD-10-CM

## 2018-06-01 NOTE — Therapy (Signed)
[place], [place], [address] Phone: [phone]   Fax:  [phone]  Physical Therapy Treatment  Patient Details  Name: Danielle [name] MRN: [id number] Date of Birth: May 25, 1931 Referring Provider (PT): Patrecia Pour, MD   Encounter Date: 06/01/2018  PT End of Session - 06/01/18 1419    Visit Number  3    Number of Visits  5    Date for PT Re-Evaluation  06/07/18    Authorization Type  [employer] Medicare    Authorization Time Period  05/10/18 to 06/07/18    Authorization - Visit Number  3    Authorization - Number of Visits  10    PT Start Time  6237    Equipment Utilized During Treatment  Gait belt    Activity Tolerance  Patient tolerated treatment well    Behavior During Therapy  [facility] for tasks assessed/performed       Past Medical History:  Diagnosis Date  . Arthritis    "some joint pain once in awhile" (05/26/2017)  . History of kidney stones   . Hypertension   . Hypothyroidism (acquired)   . Pneumothorax 02/05/2017   right-after fall  . Rib fractures 02/05/2017   right side  . Type II diabetes mellitus ([place])     Past Surgical History:  Procedure Laterality Date  . AUGMENTATION MAMMAPLASTY Bilateral   . DILATION AND CURETTAGE OF UTERUS    . FRACTURE SURGERY    . REVERSE SHOULDER ARTHROPLASTY Right 05/26/2017  . REVERSE SHOULDER ARTHROPLASTY Right 05/26/2017   Procedure: REVERSE RIGHT SHOULDER ARTHROPLASTY;  Surgeon: Meredith Pel, MD;  Location: [facility];  Service: Orthopedics;  Laterality: Right;  . TONSILLECTOMY    . TUBAL LIGATION      There were no vitals filed for this visit.  Subjective Assessment - 06/01/18 1422    Subjective  Pt reports she has been more compliant with her HEP. Her husband agrees that she did do better this week.     How long can you sit comfortably?  no issues    How long can you stand comfortably?  pretty good while    How long can you walk comfortably?  limited  community ambulator when good weather; self-limits her walking due to fear of falling from her fall 04/25/17    Patient Stated Goals  get better    Currently in Pain?  No/denies          [facility] Adult PT Treatment/Exercise - 06/01/18 0001      Knee/Hip Exercises: Standing   Heel Raises  Both;15 reps    Heel Raises Limitations  heel and toe slope    Lateral Step Up  Both;10 reps;Step Height: 6";Hand Hold: 2    Forward Step Up  Both;10 reps;Step Height: 6";Hand Hold: 2    Functional Squat  2 sets;10 reps    Functional Squat Limitations  min cues for form    Wall Squat  2 sets;10 reps    Wall Squat Limitations  min cues for form      Balance Exercises - 06/01/18 1437      Balance Exercises: Standing   Tandem Stance  Foam/compliant surface;Intermittent upper extremity support;3 reps;10 secs    SLS  Eyes open;Solid surface;Intermittent upper extremity support;5 reps;10 secs    SLS with Vectors  Solid surface;Upper extremity assist 1;3 reps   x3" holds   Rockerboard  Anterior/posterior;Lateral;EO;Intermittent UE support   x2 mins each  Other Standing Exercises  ball toss on foam x20 reps            PT Education - 06/01/18 1420    Education Details  exercise technique, contiue and updated HEP    Person(s) Educated  Patient    Methods  Explanation;Demonstration;Handout    Comprehension  Verbalized understanding;Returned demonstration       PT Short Term Goals - 05/10/18 1210      PT SHORT TERM GOAL #1   Title  Pt will be independent with HEP and perform consistently in order to improve strength and balance.     Time  2    Period  Weeks    Status  New    Target Date  05/24/18      PT SHORT TERM GOAL #2   Title  Pt will be able to perform bil SLS for 10 sec or > without UE support to demo improved balance and maximize her gait to reduce risk for falls.     Time  2    Period  Weeks    Status  New        PT Long Term Goals - 05/10/18 1210      PT LONG TERM GOAL #1    Title  Pt will have improved bilateral proximal hip extension MMT to 4/5 and hip abd MMT to 4+/5 in order to maximize her balance and gait.     Time  4    Period  Weeks    Status  New    Target Date  06/07/18      PT LONG TERM GOAL #2   Title  Pt will be able to perform bil tandem stance for 10 sec or > without UE support in order to demo improved functional strength and balance.    Time  4    Period  Weeks    Status  New      PT LONG TERM GOAL #3   Title  Pt will score 25/30 on the FGA or better to demo improved dynamic balance and reduce her risk for falls during ambulation in the community.     Time  4    Period  Weeks    Status  New            Plan - 06/01/18 1501    Clinical Impression Statement  Continued with established POC focusing on improving overall strength and balance. Min cues for form and for controlled movements throughout therex. Updated HEP to include more strengthening. Pt continues to be unsteady during balance activities, mainly NBOS and dynamic balance challenges. Requires max cues to not use BUE support in order to challenge and improve balance. Pt due for reassessment next visit.     Rehab Potential  Excellent    PT Frequency  1x / week    PT Duration  4 weeks    PT Treatment/Interventions  ADLs/Self Care Home Management;Aquatic Therapy;Canalith Repostioning;Cryotherapy;Electrical Stimulation;Moist Heat;Traction;DME Instruction;Gait training;Stair training;Functional mobility training;Therapeutic activities;Therapeutic exercise;Balance training;Neuromuscular re-education;Patient/family education;Manual techniques;Passive range of motion;Dry needling;Energy conservation;Splinting;Taping;Vestibular    PT Next Visit Plan  reasesssment; cotninue functional strengthening and dynamic balance training    PT Home Exercise Plan  eval: bridging, clams, SLS, red putty; 1/7: hip abd, sidestepping RTB; 1/14: fwd/lat step ups, 1/4 wall squat    Consulted and Agree with  Plan of Care  Patient       Patient will benefit from skilled therapeutic intervention in order to improve the following deficits  and impairments:  Abnormal gait, Decreased activity tolerance, Decreased balance, Decreased strength, Difficulty walking  Visit Diagnosis: Unsteadiness on feet  Muscle weakness (generalized)     Problem List Patient Active Problem List   Diagnosis Date Noted  . Acute CVA (cerebrovascular accident) ([place]) 05/03/2018  . Hypothyroidism (acquired) 05/03/2018  . Hypertension 05/03/2018  . Uncontrolled type 2 diabetes mellitus with diabetic nephropathy, with long-term current use of insulin ([place])   . UTI due to extended-spectrum beta lactamase (ESBL) producing Escherichia coli   . Pneumothorax 02/05/2017  . Multiple rib fractures 02/05/2017  . Type 2 diabetes mellitus with hypoglycemia without coma ([place]) 02/05/2017  . Clavicle fracture 02/05/2017  . Elevated blood pressure reading 02/05/2017  . UTI (urinary tract infection) 02/05/2017  . Back pain at L4-L5 level 06/14/2013  . Muscle weakness (generalized) 12/17/2012  . Pain in joint, shoulder region 12/17/2012  . Shoulder fracture 12/14/2012  . Proximal humerus fracture 11/29/2012  . FRACTURE, TOE, RIGHT 09/10/2009       Geraldine Solar PT, DPT  [facility] [address] [place], [place], [address] Phone: [phone]   Fax:  [phone]  Name: Danielle [name] MRN: [id number] Date of Birth: Nov 17, 1931

## 2018-06-07 ENCOUNTER — Telehealth (HOSPITAL_COMMUNITY): Payer: Self-pay | Admitting: Pulmonary Disease

## 2018-06-07 NOTE — Telephone Encounter (Signed)
06/07/18  husband cx appt, sore throat, can't talk and having trouble breathing

## 2018-06-08 ENCOUNTER — Other Ambulatory Visit: Payer: Self-pay

## 2018-06-08 ENCOUNTER — Ambulatory Visit (HOSPITAL_COMMUNITY): Payer: Medicare Other

## 2018-06-08 ENCOUNTER — Emergency Department (HOSPITAL_COMMUNITY): Payer: Medicare Other

## 2018-06-08 ENCOUNTER — Inpatient Hospital Stay (HOSPITAL_COMMUNITY)
Admission: EM | Admit: 2018-06-08 | Discharge: 2018-06-13 | DRG: 871 | Disposition: A | Discharge status: Home / Self Care | Payer: Medicare Other | Attending: Pulmonary Disease | Admitting: Pulmonary Disease

## 2018-06-08 ENCOUNTER — Encounter (HOSPITAL_COMMUNITY): Payer: Self-pay | Admitting: Emergency Medicine

## 2018-06-08 DIAGNOSIS — N3090 Cystitis, unspecified without hematuria: Secondary | ICD-10-CM | POA: Diagnosis present

## 2018-06-08 DIAGNOSIS — Z8673 Personal history of transient ischemic attack (TIA), and cerebral infarction without residual deficits: Secondary | ICD-10-CM

## 2018-06-08 DIAGNOSIS — Z79899 Other long term (current) drug therapy: Secondary | ICD-10-CM

## 2018-06-08 DIAGNOSIS — Z7982 Long term (current) use of aspirin: Secondary | ICD-10-CM

## 2018-06-08 DIAGNOSIS — A419 Sepsis, unspecified organism: Secondary | ICD-10-CM | POA: Diagnosis not present

## 2018-06-08 DIAGNOSIS — I10 Essential (primary) hypertension: Secondary | ICD-10-CM | POA: Diagnosis present

## 2018-06-08 DIAGNOSIS — Z7902 Long term (current) use of antithrombotics/antiplatelets: Secondary | ICD-10-CM

## 2018-06-08 DIAGNOSIS — Z794 Long term (current) use of insulin: Secondary | ICD-10-CM | POA: Diagnosis not present

## 2018-06-08 DIAGNOSIS — J189 Pneumonia, unspecified organism: Secondary | ICD-10-CM | POA: Diagnosis present

## 2018-06-08 DIAGNOSIS — Z87891 Personal history of nicotine dependence: Secondary | ICD-10-CM | POA: Diagnosis not present

## 2018-06-08 DIAGNOSIS — Z79890 Hormone replacement therapy (postmenopausal): Secondary | ICD-10-CM | POA: Diagnosis not present

## 2018-06-08 DIAGNOSIS — E785 Hyperlipidemia, unspecified: Secondary | ICD-10-CM | POA: Diagnosis present

## 2018-06-08 DIAGNOSIS — R531 Weakness: Secondary | ICD-10-CM

## 2018-06-08 DIAGNOSIS — J181 Lobar pneumonia, unspecified organism: Secondary | ICD-10-CM

## 2018-06-08 DIAGNOSIS — J440 Chronic obstructive pulmonary disease with acute lower respiratory infection: Secondary | ICD-10-CM | POA: Diagnosis present

## 2018-06-08 DIAGNOSIS — E039 Hypothyroidism, unspecified: Secondary | ICD-10-CM | POA: Diagnosis present

## 2018-06-08 DIAGNOSIS — R6520 Severe sepsis without septic shock: Secondary | ICD-10-CM | POA: Diagnosis present

## 2018-06-08 DIAGNOSIS — E11649 Type 2 diabetes mellitus with hypoglycemia without coma: Secondary | ICD-10-CM | POA: Diagnosis present

## 2018-06-08 DIAGNOSIS — Z87442 Personal history of urinary calculi: Secondary | ICD-10-CM

## 2018-06-08 DIAGNOSIS — Z96611 Presence of right artificial shoulder joint: Secondary | ICD-10-CM | POA: Diagnosis present

## 2018-06-08 DIAGNOSIS — Y95 Nosocomial condition: Secondary | ICD-10-CM | POA: Diagnosis present

## 2018-06-08 DIAGNOSIS — R05 Cough: Secondary | ICD-10-CM | POA: Diagnosis not present

## 2018-06-08 DIAGNOSIS — Z888 Allergy status to other drugs, medicaments and biological substances status: Secondary | ICD-10-CM | POA: Diagnosis not present

## 2018-06-08 HISTORY — DX: Hyperlipidemia, unspecified: E78.5

## 2018-06-08 LAB — URINALYSIS, ROUTINE W REFLEX MICROSCOPIC
Bilirubin Urine: NEGATIVE
Ketones, ur: 20 mg/dL — AB
LEUKOCYTES UA: NEGATIVE
NITRITE: NEGATIVE
Protein, ur: 30 mg/dL — AB
Specific Gravity, Urine: 1.032 — ABNORMAL HIGH (ref 1.005–1.030)
pH: 5 (ref 5.0–8.0)

## 2018-06-08 LAB — COMPREHENSIVE METABOLIC PANEL
ALT: 49 U/L — ABNORMAL HIGH (ref 0–44)
AST: 56 U/L — ABNORMAL HIGH (ref 15–41)
Albumin: 3.5 g/dL (ref 3.5–5.0)
Alkaline Phosphatase: 144 U/L — ABNORMAL HIGH (ref 38–126)
Anion gap: 10 (ref 5–15)
BUN: 16 mg/dL (ref 8–23)
CHLORIDE: 101 mmol/L (ref 98–111)
CO2: 24 mmol/L (ref 22–32)
Calcium: 9.3 mg/dL (ref 8.9–10.3)
Creatinine, Ser: 0.9 mg/dL (ref 0.44–1.00)
GFR calc Af Amer: >60 mL/min (ref >60)
GFR, EST NON AFRICAN AMERICAN: 59 mL/min — AB (ref >60)
Glucose, Bld: 308 mg/dL — ABNORMAL HIGH (ref 70–99)
Potassium: 3.9 mmol/L (ref 3.5–5.1)
Sodium: 135 mmol/L (ref 135–145)
Total Bilirubin: 1.2 mg/dL (ref 0.3–1.2)
Total Protein: 6.8 g/dL (ref 6.5–8.1)

## 2018-06-08 LAB — CBC WITH DIFFERENTIAL/PLATELET
Abs Immature Granulocytes: 0.07 10*3/uL (ref 0.00–0.07)
Basophils Absolute: 0.1 10*3/uL (ref 0.0–0.1)
Basophils Relative: 0 %
Eosinophils Absolute: 0.1 10*3/uL (ref 0.0–0.5)
Eosinophils Relative: 1 %
HCT: 43.5 % (ref 36.0–46.0)
Hemoglobin: 14.1 g/dL (ref 12.0–15.0)
Immature Granulocytes: 1 %
Lymphocytes Relative: 11 %
Lymphs Abs: 1.5 10*3/uL (ref 0.7–4.0)
MCH: 30.5 pg (ref 26.0–34.0)
MCHC: 32.4 g/dL (ref 30.0–36.0)
MCV: 94 fL (ref 80.0–100.0)
Monocytes Absolute: 1.1 10*3/uL — ABNORMAL HIGH (ref 0.1–1.0)
Monocytes Relative: 8 %
NEUTROS PCT: 79 %
Neutro Abs: 10.1 10*3/uL — ABNORMAL HIGH (ref 1.7–7.7)
Platelets: 217 10*3/uL (ref 150–400)
RBC: 4.63 MIL/uL (ref 3.87–5.11)
RDW: 15.4 % (ref 11.5–15.5)
WBC: 12.9 10*3/uL — ABNORMAL HIGH (ref 4.0–10.5)
nRBC: 0 % (ref 0.0–0.2)

## 2018-06-08 LAB — LACTIC ACID, PLASMA
LACTIC ACID, VENOUS: 2.1 mmol/L — AB (ref 0.5–1.9)
Lactic Acid, Venous: 1.9 mmol/L (ref 0.5–1.9)

## 2018-06-08 LAB — INFLUENZA PANEL BY PCR (TYPE A & B)
Influenza A By PCR: NEGATIVE
Influenza B By PCR: NEGATIVE

## 2018-06-08 LAB — CBG MONITORING, ED: Glucose-Capillary: 285 mg/dL — ABNORMAL HIGH (ref 70–99)

## 2018-06-08 MED ORDER — ENOXAPARIN SODIUM 40 MG/0.4ML ~~LOC~~ SOLN: 40.0000 mg | SUBCUTANEOUS | Status: DC

## 2018-06-08 MED ORDER — ASPIRIN EC 81 MG PO TBEC
81.0000 mg | DELAYED_RELEASE_TABLET | Freq: Every day | ORAL | Status: DC
  Administered 2018-06-08 – 2018-06-13 (×6): 81 mg via ORAL
  Filled 2018-06-08 (×6): qty 1

## 2018-06-08 MED ORDER — INSULIN ASPART 100 UNIT/ML ~~LOC~~ SOLN
0.0000 [IU] | Freq: Three times a day (TID) | SUBCUTANEOUS | Status: DC
  Administered 2018-06-09 – 2018-06-10 (×3): 4 [IU] via SUBCUTANEOUS
  Filled 2018-06-08: qty 1

## 2018-06-08 MED ORDER — ORAL CARE MOUTH RINSE: 15.0000 mL | Freq: Two times a day (BID) | OROMUCOSAL | Status: DC

## 2018-06-08 MED ORDER — INSULIN DETEMIR 100 UNIT/ML ~~LOC~~ SOLN
12.0000 [IU] | Freq: Every day | SUBCUTANEOUS | Status: DC
  Administered 2018-06-09 – 2018-06-13 (×5): 12 [IU] via SUBCUTANEOUS
  Filled 2018-06-08 (×6): qty 0.12

## 2018-06-08 MED ORDER — CHLORHEXIDINE GLUCONATE 0.12 % MT SOLN
15.0000 mL | Freq: Two times a day (BID) | OROMUCOSAL | Status: DC
  Administered 2018-06-09 – 2018-06-12 (×7): 15 mL via OROMUCOSAL
  Filled 2018-06-08 (×7): qty 15

## 2018-06-08 MED ORDER — LEVOTHYROXINE SODIUM 88 MCG PO TABS
88.0000 ug | ORAL_TABLET | Freq: Every day | ORAL | Status: DC
  Administered 2018-06-09 – 2018-06-13 (×5): 88 ug via ORAL
  Filled 2018-06-08 (×5): qty 1

## 2018-06-08 MED ORDER — SODIUM CHLORIDE 0.9 % IV SOLN
500.0000 mg | INTRAVENOUS | Status: DC
  Administered 2018-06-09 – 2018-06-12 (×4): 500 mg via INTRAVENOUS
  Filled 2018-06-08 (×6): qty 500

## 2018-06-08 MED ORDER — SODIUM CHLORIDE 0.9 % IV SOLN
500.0000 mg | Freq: Once | INTRAVENOUS | Status: AC
  Administered 2018-06-08: 500 mg via INTRAVENOUS
  Filled 2018-06-08: qty 500

## 2018-06-08 MED ORDER — ACETAMINOPHEN 650 MG RE SUPP: 650.0000 mg | Freq: Four times a day (QID) | RECTAL | Status: DC | PRN

## 2018-06-08 MED ORDER — ATORVASTATIN CALCIUM 40 MG PO TABS
40.0000 mg | ORAL_TABLET | Freq: Every day | ORAL | Status: DC
  Administered 2018-06-08 – 2018-06-12 (×5): 40 mg via ORAL
  Filled 2018-06-08 (×5): qty 1

## 2018-06-08 MED ORDER — SODIUM CHLORIDE 0.9 % IV SOLN
1.0000 g | Freq: Once | INTRAVENOUS | Status: AC
  Administered 2018-06-08: 1 g via INTRAVENOUS
  Filled 2018-06-08: qty 10

## 2018-06-08 MED ORDER — ACETAMINOPHEN 325 MG PO TABS
650.0000 mg | ORAL_TABLET | Freq: Four times a day (QID) | ORAL | Status: DC | PRN
  Administered 2018-06-08: 650 mg via ORAL
  Filled 2018-06-08 (×2): qty 2

## 2018-06-08 MED ORDER — INSULIN ASPART 100 UNIT/ML ~~LOC~~ SOLN
0.0000 [IU] | Freq: Every day | SUBCUTANEOUS | Status: DC
  Administered 2018-06-08: 3 [IU] via SUBCUTANEOUS

## 2018-06-08 MED ORDER — SODIUM CHLORIDE 0.9 % IV SOLN
1.0000 g | Freq: Three times a day (TID) | INTRAVENOUS | Status: DC
  Administered 2018-06-08 – 2018-06-09 (×2): 1 g via INTRAVENOUS
  Filled 2018-06-08 (×8): qty 1

## 2018-06-08 MED ORDER — SODIUM CHLORIDE 0.9 % IV SOLN
INTRAVENOUS | Status: DC
  Administered 2018-06-08 – 2018-06-10 (×3): via INTRAVENOUS

## 2018-06-08 MED ORDER — ONDANSETRON HCL 4 MG/2ML IJ SOLN: 4.0000 mg | Freq: Four times a day (QID) | INTRAMUSCULAR | Status: DC | PRN

## 2018-06-08 MED ORDER — CLOPIDOGREL BISULFATE 75 MG PO TABS
75.0000 mg | ORAL_TABLET | Freq: Every day | ORAL | Status: DC
  Administered 2018-06-09 – 2018-06-13 (×5): 75 mg via ORAL
  Filled 2018-06-08 (×5): qty 1

## 2018-06-08 MED ORDER — ONDANSETRON HCL 4 MG PO TABS: 4.0000 mg | ORAL_TABLET | Freq: Four times a day (QID) | ORAL | Status: DC | PRN

## 2018-06-08 NOTE — Progress Notes (Signed)
Pharmacy Antibiotic Note  Danielle [name] is a 85 y.o. female admitted on 06/08/2018 with pneumonia.  Pharmacy has been consulted for cefepime dosing.  Plan: cefepime 1gm iv q8h  Height: 5\' 2"  (157.5 cm) Weight: 126 lb (57.2 kg) IBW/kg (Calculated) : 50.1  Temp (24hrs), Avg:99.9 F (37.7 C), Min:98.6 F (37 C), Max:101.2 F (38.4 C)  Recent Labs  Lab 06/08/18 1320 06/08/18 1657 06/08/18 1841  WBC 12.9*  --   --   CREATININE 0.90  --   --   LATICACIDVEN  --  1.9 2.1*    Estimated Creatinine Clearance: 36.8 mL/min (by C-G formula based on SCr of 0.9 mg/dL).    Allergies  Allergen Reactions  . Fosamax [Alendronate Sodium] Other (See Comments)    MUSCLE ACHES  . Prednisone     Feels jittery and nauseous    Antimicrobials this admission: 1/21 cefepime >>   1/21 azithromycin >>  1/21 ceftriaxone x 1   Microbiology results: 1/21 BCx: sent 1/21 MRSA PCR: sent  Thank you for allowing pharmacy to be a part of this patient's care.  Danielle [name] 06/08/2018 7:45 PM

## 2018-06-08 NOTE — Progress Notes (Signed)
Brief note regarding plan, with full H&P to follow:    Danielle [name] is a 86 y.o. female with medical history significant for type 2 diabetes mellitus, acute ischemic CVA, who is admitted to [facility] on 06/08/2018 with severe sepsis due to HCAP pneumonia involving the right middle lobe after presenting from home to the  Hospital emergency department complaining of shortness of breath.   #) Severe sepsis due to HCAP pneumonia involving right middle lobe: Diagnosis on the basis of presenting 3 to 4 days of shortness of breath associated with nonproductive cough and objective fever, with presenting chest x-ray showing evidence of right middle lobe infiltrate concerning for pneumonia.  Sirs criteria are met via objective fever, tachypnea, and leukocytosis.  Criteria are met for patient sepsis to be considered severe nature on the basis of an elevated lactic acid of 2.1.  No evidence of associated hemodynamic instability.  In the absence of an elevated lactic acid of greater than 4.0 or the presence of hypotension, there is no indication at the present time for initiation of a 30 mL/kg IVF bolus.  Patient's pneumonia meets criteria to be considered HCAP given recent hospitalization in December 2019.  Therefore, will expand antibiotic coverage relative to the Rocephin and azithromycin received in the emergency department, is further described low.  Blood cultures x2 were collected in the ED today.  Plan: In the setting of HCAP pna, will broaden antibiotic coverage by initiating cefepime at this time.  Will also continue a azithromycin for maintenance of provision of atypical coverage.  Will refrain from IV vancomycin for now, but will monitor closely for results of MRSA PCR with its associated negative predictive value.  Normal saline at 75 cc/h.  Monitor strict I's and O's.  Repeat lactic acid in 3 hours.  Will follow for results blood cultures x2 collected in the ED today.  Repeat CBC  with differential in the morning.  Droplet precautions.  We will also check for influenza.  PRN supplemental oxygen order to maintain oxygen saturations greater than or equal to 92%.  PRN acetaminophen for subsequent fever.    Justin Howerter, DO Hospitalist 

## 2018-06-08 NOTE — ED Notes (Addendum)
Date and time results received: 06/08/18 19;47 (use smartphrase ".now" to insert current time)  Test: lactic  Critical Value: 2.1  Name of Provider Notified: Howerter  MD notified by message  Orders Received? Or Actions Taken?: provider notified

## 2018-06-08 NOTE — H&P (Signed)
History and Physical    PLEASE NOTE THAT DRAGON DICTATION SOFTWARE WAS USED IN THE CONSTRUCTION OF THIS NOTE.   Danielle [name] WYO:[id number] DOB: [birth year] DOA: 06/08/2018  PCP: Sinda Du, MD Patient coming from: home  I have personally briefly reviewed patient's old medical records in [place]  Chief Complaint: shortness of breath  HPI: Danielle [name] is a 85 y.o. female with medical history significant for type 2 diabetes mellitus, acute ischemic CVA, who is admitted to [facility] on 06/08/2018 with severe sepsis due to HCAP pneumonia involving the right middle lobe after presenting from home to the [facility] emergency department complaining of shortness of breath.  The patient reports 3 to 4 days of progressive shortness of breath associated with nonproductive cough and objective fever of greater than 101 F at home.  Denies any associated nausea, vomiting, diarrhea, abdominal pain, or rash.  She does however report generalized weakness over that timeframe.  Denies any associated orthopnea, PND, or peripheral edema.  She also denies any associated chest pain, palpitations, dizziness, presyncope, or syncope.  Denies any recent traveling or any recent known sick contacts.  However, the patient was recently hospitalized within the [facility] system from 05/03/2018 to 05/04/2018 for acute ischemic CVA during which Plavix was added to her baseline aspirin for provision of a transient course of dual antiplatelet therapy.  1 week ago, the patient presented to her PCP complaining of dysuria.  At that time, she was diagnosed with a urinary tract infection and completed a 5-day course of amoxicillin with ensuing resolution of her initial dysuria.  At this time, the patient denies any residual dysuria, gross hematuria, or urinary urgency/frequency.   ED Course: Vital signs in the emergency department were notable for the following: T-max one 1.2; heart rate 85-91;  blood pressure 173/62; respiratory rate 18-22, and oxygen saturation 94 to 97% on room air.  Labs performed in the ED were notable for the following: BMP notable for bicarbonate 24, creatinine 0.9, and glucose 308.  CBC notable for white blood cell count of 12,900 with 79% neutrophils.  Lactic acid 2.1.  Urinalysis showed 0-5 white blood cells, no squamous epithelial cells and was noted to be nitrite and leukocyte esterace negative.  Blood cultures x2 were collected prior to provision of any antibiotics.  Chest x-ray, per final radiology report showed evidence of right middle lobe infiltrate concerning for pneumonia.  While still in the ED, the following were administered: A azithromycin 500 mg IV x1 as well as Rocephin 1 g IV x1.  Subsequently, the patient was admitted for further evaluation and management of severe sepsis due to HCAP pna.     Review of Systems: As per HPI otherwise 10 point review of systems negative.   Past Medical History:  Diagnosis Date  . Arthritis    "some joint pain once in awhile" (05/26/2017)  . History of kidney stones   . Hyperlipemia   . Hypertension   . Hypothyroidism (acquired)   . Pneumothorax 02/05/2017   right-after fall  . Rib fractures 02/05/2017   right side  . Type II diabetes mellitus ([place])     Past Surgical History:  Procedure Laterality Date  . AUGMENTATION MAMMAPLASTY Bilateral   . DILATION AND CURETTAGE OF UTERUS    . FRACTURE SURGERY    . REVERSE SHOULDER ARTHROPLASTY Right 05/26/2017  . REVERSE SHOULDER ARTHROPLASTY Right 05/26/2017   Procedure: REVERSE RIGHT SHOULDER ARTHROPLASTY;  Surgeon: Meredith Pel, MD;  Location: [place];  Service: Orthopedics;  Laterality: Right;  . TONSILLECTOMY    . TUBAL LIGATION      Social History:  reports that she quit smoking about 29 years ago. Her smoking use included cigarettes. She has a 40.00 pack-year smoking history. She has never used smokeless tobacco. She reports that she does not drink  alcohol or use drugs.   Allergies  Allergen Reactions  . Fosamax [Alendronate Sodium] Other (See Comments)    MUSCLE ACHES  . Prednisone     Feels jittery and nauseous    History reviewed. No pertinent family history.   Prior to Admission medications   Medication Sig Start Date End Date Taking? Authorizing Provider  amoxicillin (AMOXIL) 500 MG capsule Take 500 mg by mouth 3 (three) times daily.   Yes [provider]  aspirin EC 81 MG tablet Take 81 mg by mouth daily.   Yes [provider]  atorvastatin (LIPITOR) 40 MG tablet Take 1 tablet (40 mg total) by mouth daily at 6 PM. 05/04/18  Yes Patrecia Pour, MD  Calcium Carb-Cholecalciferol (CALCIUM 600 + D PO) Take 1 tablet by mouth daily.   Yes [provider]  clopidogrel (PLAVIX) 75 MG tablet Take 1 tablet (75 mg total) by mouth daily with breakfast. 05/05/18  Yes Patrecia Pour, MD  Insulin Glargine (TOUJEO MAX SOLOSTAR) 300 UNIT/ML SOPN Inject 22 Units into the skin daily. Patient taking differently: Inject 18-22 Units into the skin daily. PER SLIDING SCALE 02/08/17  Yes Allie Bossier, MD  insulin lispro (HUMALOG) 100 UNIT/ML injection Inject 8-10 Units into the skin 2 (two) times daily.    Yes [provider]  levothyroxine (SYNTHROID, LEVOTHROID) 88 MCG tablet Take 88 mcg by mouth daily before breakfast. BRAND ONLY   Yes [provider]  Multiple Vitamin (MULTIVITAMIN WITH MINERALS) TABS Take 1 tablet by mouth daily.    Yes [provider]  multivitamin-lutein (OCUVITE-LUTEIN) CAPS capsule Take 1 capsule by mouth daily.   Yes [provider]  Omega-3 Fatty Acids (FISH OIL) 1000 MG CAPS Take 1,000 mg by mouth daily.   Yes [provider]  vitamin B-12 (CYANOCOBALAMIN) 1000 MCG tablet Take 1,000 mcg by mouth daily.   Yes [provider]      Objective     Physical Exam: Vitals:   06/08/18 2030 06/08/18 2217 06/08/18 2236 06/08/18 2238  BP:  (!)  146/54  (!) 129/49  Pulse: 93 79  87  Resp: (!) 23 16  20   Temp: (!) 100.9 F (38.3 C)   99.9 F (37.7 C)  TempSrc: Oral   Oral  SpO2: 94% 95%  94%  Weight:   57.7 kg   Height:   5' 2"  (1.575 m)     General: appears to be stated age; alert, oriented Skin: warm, dry, no rash Head:  AT/Teller Mouth:  Oral mucosa membranes appear dry, normal dentition Neck: supple; trachea midline Heart:  RRR; did not appreciate any M/R/G Lungs: diminished bibasilar breath sounds; otherwise CTAB, including no rales or wheezes. Abdomen: + BS; soft, ND, NT Extremities: no peripheral edema, no muscle wasting    Labs on Admission: I have personally reviewed following labs and imaging studies  CBC: Recent Labs  Lab 06/08/18 1320  WBC 12.9*  NEUTROABS 10.1*  HGB 14.1  HCT 43.5  MCV 94.0  PLT 338   Basic Metabolic Panel: Recent Labs  Lab 06/08/18 1320  NA 135  K 3.9  CL  101  CO2 24  GLUCOSE 308*  BUN 16  CREATININE 0.90  CALCIUM 9.3   GFR: Estimated Creatinine Clearance: 36.8 mL/min (by C-G formula based on SCr of 0.9 mg/dL). Liver Function Tests: Recent Labs  Lab 06/08/18 1320  AST 56*  ALT 49*  ALKPHOS 144*  BILITOT 1.2  PROT 6.8  ALBUMIN 3.5   No results for input(s): LIPASE, AMYLASE in the last 168 hours. No results for input(s): AMMONIA in the last 168 hours. Coagulation Profile: No results for input(s): INR, PROTIME in the last 168 hours. Cardiac Enzymes: No results for input(s): CKTOTAL, CKMB, CKMBINDEX, TROPONINI in the last 168 hours. BNP (last 3 results) No results for input(s): PROBNP in the last 8760 hours. HbA1C: No results for input(s): HGBA1C in the last 72 hours. CBG: Recent Labs  Lab 06/08/18 2200  GLUCAP 285*   Lipid Profile: No results for input(s): CHOL, HDL, LDLCALC, TRIG, CHOLHDL, LDLDIRECT in the last 72 hours. Thyroid Function Tests: No results for input(s): TSH, T4TOTAL, FREET4, T3FREE, THYROIDAB in the last 72 hours. Anemia Panel: No  results for input(s): VITAMINB12, FOLATE, FERRITIN, TIBC, IRON, RETICCTPCT in the last 72 hours. Urine analysis:    Component Value Date/Time   COLORURINE AMBER (A) 06/08/2018 1300   APPEARANCEUR HAZY (A) 06/08/2018 1300   LABSPEC 1.032 (H) 06/08/2018 1300   PHURINE 5.0 06/08/2018 1300   GLUCOSEU >=500 (A) 06/08/2018 1300   HGBUR MODERATE (A) 06/08/2018 1300   BILIRUBINUR NEGATIVE 06/08/2018 1300   KETONESUR 20 (A) 06/08/2018 1300   PROTEINUR 30 (A) 06/08/2018 1300   NITRITE NEGATIVE 06/08/2018 1300   LEUKOCYTESUR NEGATIVE 06/08/2018 1300    Radiological Exams on Admission: Dg Chest 2 View  Result Date: 06/08/2018 CLINICAL DATA:  Dry cough for 1 week.  Fever and weakness. EXAM: CHEST - 2 VIEW COMPARISON:  Chest x-ray 02/06/2017 FINDINGS: The cardiac silhouette, mediastinal and hilar contours are within normal limits and stable. There is moderate tortuosity and calcification of the thoracic aorta. Increased density noted in the right middle lobe, partially obscuring the right heart border and more evident on the lateral film. Findings consistent with a right middle lobe infiltrate/pneumonia. The bony thorax is intact. 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 IMPRESSION: Right middle lobe pneumonia. Followup PA and lateral chest X-ray is recommended in 3-4 weeks following trial of antibiotic therapy to ensure resolution and exclude underlying malignancy. Electronically Signed   By: Marijo Sanes M.D.   On: 06/08/2018 13:24     Assessment/Plan   ADISSON DEAK is a 85 y.o. female with medical history significant for type 2 diabetes mellitus, acute ischemic CVA, who is admitted to [facility] on 06/08/2018 with severe sepsis due to HCAP pneumonia involving the right middle lobe after presenting from home to the [facility] emergency department complaining of shortness of breath.   Principal Problem:   HCAP  (healthcare-associated pneumonia) Active Problems:   Type 2 diabetes mellitus with hypoglycemia without coma ([place])   Hypothyroidism (acquired)   Right middle lobe pneumonia (HCC)   Severe sepsis (HCC)   Generalized weakness   #) Severe sepsis due to HCAP pneumonia involving right middle lobe: Diagnosis on the basis of presenting 3 to 4 days of shortness of breath associated with nonproductive cough and objective fever, with presenting chest x-ray showing evidence of right middle lobe infiltrate concerning for pneumonia.  Sirs criteria are met via objective fever, tachypnea, and leukocytosis.  Criteria are met  for patient sepsis to be considered severe nature on the basis of an elevated lactic acid of 2.1.  No evidence of associated hemodynamic instability.  In the absence of an elevated lactic acid of greater than 4.0 or the presence of hypotension, there is no indication at the present time for initiation of a 30 mL/kg IVF bolus.  Patient's pneumonia meets criteria to be considered HCAP given recent hospitalization in December 2019.  Therefore, will expand antibiotic coverage relative to the Rocephin and azithromycin received in the emergency department, is further described low.  Blood cultures x2 were collected in the ED today.  Plan: In the setting of HCAP pna, will broaden antibiotic coverage by initiating cefepime at this time.  Will also continue a azithromycin for maintenance of provision of atypical coverage.  Will refrain from IV vancomycin for now, but will monitor closely for results of MRSA PCR with its associated negative predictive value.  Normal saline at 75 cc/h.  Monitor strict I's and O's.  Repeat lactic acid in 3 hours.  Will follow for results blood cultures x2 collected in the ED today.  Repeat CBC with differential in the morning.  Droplet precautions.  We will also check for influenza.  PRN supplemental oxygen order to maintain oxygen saturations greater than or equal to 92%.  PRN  acetaminophen for subsequent fever.     #) Generalized weakness: Secondary to physiologic stress stemming from presenting severe sepsis due to right middle lobe pneumonia, as further described above.  However, in the setting of a history of acquired hypothyroidism, also check TSH.  Plan: I have placed an order for physical therapy consult to occur in the morning.  Check TSH, as described above.  Work-up and management of severe sepsis due to right middle lobe pneumonia, as above.    #) Type 2 diabetes mellitus: Outpatient insulin regimen consists of the following: Insulin glargine 20 units subcu every morning as well as 8 to 10 units of sliding scale Humalog twice daily with lunch and dinner.  Glucose per presenting BMP noted to be elevated at 308, with likely contribution from physiologic stress stemming from presenting severe sepsis due to pneumonia.  Plan: Levemir 12 units subcu every morning.  Accu-Cheks q. before meals and at bedtime with sliding scale insulin.    #) Acquired hypothyroidism: On Synthroid as an outpatient.  Plan: Continue home Synthroid.  Check TSH, as further described above.     DVT prophylaxis: scd's Code Status: full Family Communication: Patient's case was discussed with her daughter, who is present at bedside. Disposition Plan:  Per Rounding Team Consults called: (none)  Admission status: Inpatient; MedSurg.   PLEASE NOTE THAT DRAGON DICTATION SOFTWARE WAS USED IN THE CONSTRUCTION OF THIS NOTE.   [facility] Pager [phone] From 3PM- 11PM.   Otherwise, please contact night-coverage  [email or web address] Password [facility]  06/08/2018

## 2018-06-08 NOTE — ED Triage Notes (Signed)
Pt c/o of dry cough x 1 week with fever and weakness.

## 2018-06-08 NOTE — ED Provider Notes (Addendum)
[facility] EMERGENCY DEPARTMENT Provider Note   CSN: [id number] Arrival date & time: 06/08/18  1233     History   Chief Complaint Chief Complaint  Patient presents with  . Cough    HPI Danielle [name] is a 85 y.o. female with history of cva, htn, DM type 2 presenting to the emergency department with cough and weakness x 1 week. Pt reports the cough started as non productive, but has become productive x 3 days. She admits to associated symptoms of sore throat and fevers. Her Tmax was 101.3 at home yesterday. Her spouse is concerned about her progressive weakness.  Denies chest pain, urinary symptoms, dyspnea, nausea, vomiting, abdominal pain.  Pt was recently diagnosed with cystitis by her pcp and was started on Amoxicillin. She has taken 4 doses so far.  Past Medical History:  Diagnosis Date  . Arthritis    "some joint pain once in awhile" (05/26/2017)  . History of kidney stones   . Hyperlipemia   . Hypertension   . Hypothyroidism (acquired)   . Pneumothorax 02/05/2017   right-after fall  . Rib fractures 02/05/2017   right side  . Type II diabetes mellitus [facility])     Patient Active Problem List   Diagnosis Date Noted  . Acute CVA (cerebrovascular accident) ([place]) 05/03/2018  . Hypothyroidism (acquired) 05/03/2018  . Hypertension 05/03/2018  . Uncontrolled type 2 diabetes mellitus with diabetic nephropathy, with long-term current use of insulin ([place])   . UTI due to extended-spectrum beta lactamase (ESBL) producing Escherichia coli   . Pneumothorax 02/05/2017  . Multiple rib fractures 02/05/2017  . Type 2 diabetes mellitus with hypoglycemia without coma ([place]) 02/05/2017  . Clavicle fracture 02/05/2017  . Elevated blood pressure reading 02/05/2017  . UTI (urinary tract infection) 02/05/2017  . Back pain at L4-L5 level 06/14/2013  . Muscle weakness (generalized) 12/17/2012  . Pain in joint, shoulder region 12/17/2012  . Shoulder fracture 12/14/2012  . Proximal  humerus fracture 11/29/2012  . FRACTURE, TOE, RIGHT 09/10/2009    Past Surgical History:  Procedure Laterality Date  . AUGMENTATION MAMMAPLASTY Bilateral   . DILATION AND CURETTAGE OF UTERUS    . FRACTURE SURGERY    . REVERSE SHOULDER ARTHROPLASTY Right 05/26/2017  . REVERSE SHOULDER ARTHROPLASTY Right 05/26/2017   Procedure: REVERSE RIGHT SHOULDER ARTHROPLASTY;  Surgeon: Meredith Pel, MD;  Location: [place];  Service: Orthopedics;  Laterality: Right;  . TONSILLECTOMY    . TUBAL LIGATION       OB History    Gravida      Para      Term      Preterm      AB      Living  2     SAB      TAB      Ectopic      Multiple      Live Births               Home Medications    Prior to Admission medications   Medication Sig Start Date End Date Taking? Authorizing Provider  amoxicillin (AMOXIL) 500 MG capsule Take 500 mg by mouth 3 (three) times daily.   Yes [provider]  aspirin EC 81 MG tablet Take 81 mg by mouth daily.   Yes [provider]  atorvastatin (LIPITOR) 40 MG tablet Take 1 tablet (40 mg total) by mouth daily at 6 PM. 05/04/18  Yes Patrecia Pour, MD  Calcium Carb-Cholecalciferol (CALCIUM 600 +  D PO) Take 1 tablet by mouth daily.   Yes [provider]  clopidogrel (PLAVIX) 75 MG tablet Take 1 tablet (75 mg total) by mouth daily with breakfast. 05/05/18  Yes Patrecia Pour, MD  Insulin Glargine (TOUJEO MAX SOLOSTAR) 300 UNIT/ML SOPN Inject 22 Units into the skin daily. Patient taking differently: Inject 18-22 Units into the skin daily. PER SLIDING SCALE 02/08/17  Yes Allie Bossier, MD  insulin lispro (HUMALOG) 100 UNIT/ML injection Inject 8-10 Units into the skin 2 (two) times daily.    Yes [provider]  levothyroxine (SYNTHROID, LEVOTHROID) 88 MCG tablet Take 88 mcg by mouth daily before breakfast.   Yes [provider]  Multiple Vitamin (MULTIVITAMIN WITH MINERALS) TABS Take 1 tablet by mouth daily.    Yes  [provider]  multivitamin-lutein (OCUVITE-LUTEIN) CAPS capsule Take 1 capsule by mouth daily.   Yes [provider]  Omega-3 Fatty Acids (FISH OIL) 1000 MG CAPS Take 1,000 mg by mouth daily.   Yes [provider]  vitamin B-12 (CYANOCOBALAMIN) 1000 MCG tablet Take 1,000 mcg by mouth daily.   Yes [provider]    Family History No family history on file.  Social History Social History   Tobacco Use  . Smoking status: Former Smoker    Packs/day: 1.00    Years: 40.00    Pack years: 40.00    Types: Cigarettes    Last attempt to quit: 1991    Years since quitting: 29.0  . Smokeless tobacco: Never Used  Substance Use Topics  . Alcohol use: No  . Drug use: No     Allergies   Fosamax [alendronate sodium] and Prednisone   Review of Systems Review of Systems  Constitutional: Positive for fever. Negative for chills.  HENT: Positive for sore throat. Negative for congestion, ear pain and sinus pain.   Eyes: Negative for pain and discharge.  Respiratory: Negative for chest tightness and shortness of breath.   Cardiovascular: Negative for chest pain and palpitations.  Gastrointestinal: Negative for abdominal pain, diarrhea, nausea and vomiting.  Genitourinary: Negative for dysuria and hematuria.  Musculoskeletal: Negative for arthralgias, neck pain and neck stiffness.  Skin: Negative for rash and wound.  Neurological: Positive for weakness. Negative for dizziness, syncope and headaches.     Physical Exam Updated Vital Signs BP (!) 178/66   Pulse 91   Temp (!) 101.2 F (38.4 C) (Rectal)   Resp 18   Ht 5\' 2"  (1.575 m)   Wt 57.2 kg   SpO2 96%   BMI 23.05 kg/m   Physical Exam Vitals signs and nursing note reviewed.  Constitutional:      Appearance: She is not ill-appearing or toxic-appearing.  HENT:     Head: Normocephalic and atraumatic.     Nose: Nose normal.     Mouth/Throat:     Mouth: Mucous membranes are dry.      Pharynx: Oropharynx is clear.  Eyes:     Conjunctiva/sclera: Conjunctivae normal.  Neck:     Musculoskeletal: Normal range of motion. No muscular tenderness.  Cardiovascular:     Rate and Rhythm: Normal rate and regular rhythm.     Pulses: Normal pulses.     Heart sounds: Normal heart sounds.  Pulmonary:     Effort: Pulmonary effort is normal.     Breath sounds: Rales present.  Abdominal:     General: There is no distension.     Palpations: Abdomen is soft.  Tenderness: There is no abdominal tenderness. There is no guarding or rebound.  Musculoskeletal: Normal range of motion.  Skin:    General: Skin is warm and dry.     Capillary Refill: Capillary refill takes less than 2 seconds.  Neurological:     Mental Status: She is alert. Mental status is at baseline.     Motor: No weakness.  Psychiatric:        Behavior: Behavior normal.      ED Treatments / Results  Labs (all labs ordered are listed, but only abnormal results are displayed) Labs Reviewed  COMPREHENSIVE METABOLIC PANEL - Abnormal; Notable for the following components:      Result Value   Glucose, Bld 308 (*)    AST 56 (*)    ALT 49 (*)    Alkaline Phosphatase 144 (*)    GFR calc non Af Amer 59 (*)    All other components within normal limits  CBC WITH DIFFERENTIAL/PLATELET - Abnormal; Notable for the following components:   WBC 12.9 (*)    Neutro Abs 10.1 (*)    Monocytes Absolute 1.1 (*)    All other components within normal limits  CULTURE, BLOOD (ROUTINE X 2)  CULTURE, BLOOD (ROUTINE X 2)  URINALYSIS, ROUTINE W REFLEX MICROSCOPIC  LACTIC ACID, PLASMA  LACTIC ACID, PLASMA    EKG None  Radiology Dg Chest 2 View  Result Date: 06/08/2018 CLINICAL DATA:  Dry cough for 1 week.  Fever and weakness. EXAM: CHEST - 2 VIEW COMPARISON:  Chest x-ray 02/06/2017 FINDINGS: The cardiac silhouette, mediastinal and hilar contours are within normal limits and stable. There is moderate tortuosity and calcification  of the thoracic aorta. Increased density noted in the right middle lobe, partially obscuring the right heart border and more evident on the lateral film. Findings consistent with a right middle lobe infiltrate/pneumonia. The bony thorax is intact. 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 IMPRESSION: Right middle lobe pneumonia. Followup PA and lateral chest X-ray is recommended in 3-4 weeks following trial of antibiotic therapy to ensure resolution and exclude underlying malignancy. Electronically Signed   By: Marijo Sanes M.D.   On: 06/08/2018 13:24    Procedures Procedures (including critical care time)  Medications Ordered in ED Medications  cefTRIAXone (ROCEPHIN) 1 g in sodium chloride 0.9 % 100 mL IVPB (has no administration in time range)  azithromycin (ZITHROMAX) 500 mg in sodium chloride 0.9 % 250 mL IVPB (has no administration in time range)     Initial Impression / Assessment and Plan / ED Course  I have reviewed the triage vital signs and the nursing notes.  Pertinent labs & imaging results that were available during my care of the patient were reviewed by me and considered in my medical decision making (see chart for details).   Pt presents with 1 week of cough and weakness. Her chest xray today shows right middle lobe pneumonia. CBC shows leukocytosis of 12.9, CMP shows glucose of 308. EKG is unchanged from prior. Pt is afebrile with oral temperature, but on exam she feels warm so temperature rechecked. Rectal temperature is 101.2. Being febrile, leukocytosis meet Sirs criteria. Added blood cultures and lactic acid and activated sepsis protocal. Started IV Rocephin and Azithromycin for pneumonia. Her port score is 94 which is suggestive of admission. Will admit to the hospitalist. Consulted her pcp who is in agreement with this plan.  The patient was discussed with and seen by Dr. Sabra Heck who  agrees with the treatment  plan.   Final Clinical Impressions(s) / ED Diagnoses   Final diagnoses:  Sepsis without acute organ dysfunction, due to unspecified organism [facility])  Community acquired pneumonia of right middle lobe of lung [facility])    ED Discharge Orders    None       Cherre Robins, PA-C 06/09/18 0248    Cherre Robins, PA-C 06/09/18 0254    Noemi Chapel, MD 06/09/18 1038

## 2018-06-08 NOTE — ED Notes (Signed)
Unable to obtain second PIV. Dr. Sabra Heck aware.

## 2018-06-08 NOTE — ED Provider Notes (Signed)
Medical screening examination/treatment/procedure(s) were conducted as a shared visit with non-physician practitioner(s) and myself.  I personally evaluated the patient during the encounter.  Clinical Impression:   Final diagnoses:  Sepsis without acute organ dysfunction, due to unspecified organism [facility])  Community acquired pneumonia of right middle lobe of lung ([place])    The patient is a ill-appearing 85 year old female who started getting sick multiple days ago approximately 4 days, she presents to the hospital with increasing weakness increasing shortness of breath, cough and feeling febrile measuring as high as 101.3 at home.  She became afebrile prior to arrival but because of persistent and progressive weakness she was brought to the hospital by her family member.  On exam the patient is hot to the touch, she has some rales at the right base and middle lobe, she is minimally tachypneic but able to speak in full sentences.  She has no edema, she is able to sit up in the bed with some assistance but appears overall generally weak.  I have personally looked at the chest x-ray and find there to be an infiltrate in the right middle lobe on the two-view PA and lateral chest x-ray.  I agree with radiologist interpretation.  I suspect the patient has a fever if taken rectally, she has a white blood cell count of close to 13,000 and likely meet Sirs criteria.  She is not hypotensive, lactic acid and blood cultures ordered, her port score is 94 suggesting admission because of mortality as high as 9.3%  On repeat exam the patient does have a temperature of 101.2 and due to this a code sepsis was activated.  It was not activated on arrival as the patient did not meet criteria however she does at this time.  Activated at 4:34 PM.  Blood cultures and lactic acid added, will give some IV fluids and antibiotics.  Will need to admit to the hospital.  I did discuss the care with Dr. Luan Pulling who is in  agreement.  Dr. Velia Meyer to admit   EKG Interpretation  Date/Time:  Tuesday June 08 2018 15:54:18 EST Ventricular Rate:  87 PR Interval:    QRS Duration: 136 QT Interval:  394 QTC Calculation: 474 R Axis:   -66 Text Interpretation:  Sinus rhythm Ventricular premature complex Left bundle branch block since last tracing no significant change Confirmed by Noemi Chapel [phone]) on 06/08/2018 7:00:52 PM      .Critical Care Performed by: Noemi Chapel, MD Authorized by: Noemi Chapel, MD   Critical care provider statement:    Critical care time (minutes):  35   Critical care time was exclusive of:  Separately billable procedures and treating other patients and teaching time   Critical care was necessary to treat or prevent imminent or life-threatening deterioration of the following conditions:  Sepsis   Critical care was time spent personally by me on the following activities:  Blood draw for specimens, development of treatment plan with patient or surrogate, discussions with consultants, evaluation of patient's response to treatment, examination of patient, obtaining history from patient or surrogate, ordering and performing treatments and interventions, ordering and review of laboratory studies, ordering and review of radiographic studies, pulse oximetry, re-evaluation of patient's condition and review of old charts      Noemi Chapel, MD 06/09/18 1035

## 2018-06-09 DIAGNOSIS — A419 Sepsis, unspecified organism: Secondary | ICD-10-CM | POA: Diagnosis present

## 2018-06-09 DIAGNOSIS — R531 Weakness: Secondary | ICD-10-CM

## 2018-06-09 DIAGNOSIS — R6520 Severe sepsis without septic shock: Secondary | ICD-10-CM

## 2018-06-09 DIAGNOSIS — J189 Pneumonia, unspecified organism: Secondary | ICD-10-CM | POA: Diagnosis present

## 2018-06-09 HISTORY — DX: Weakness: R53.1

## 2018-06-09 LAB — GLUCOSE, CAPILLARY
Glucose-Capillary: 193 mg/dL — ABNORMAL HIGH (ref 70–99)
Glucose-Capillary: 198 mg/dL — ABNORMAL HIGH (ref 70–99)
Glucose-Capillary: 52 mg/dL — ABNORMAL LOW (ref 70–99)
Glucose-Capillary: 186 mg/dL — ABNORMAL HIGH (ref 70–99)
Glucose-Capillary: 186 mg/dL — ABNORMAL HIGH (ref 70–99)

## 2018-06-09 LAB — COMPREHENSIVE METABOLIC PANEL
ALK PHOS: 112 U/L (ref 38–126)
ALT: 33 U/L (ref 0–44)
AST: 35 U/L (ref 15–41)
Albumin: 2.7 g/dL — ABNORMAL LOW (ref 3.5–5.0)
Anion gap: 7 (ref 5–15)
BUN: 16 mg/dL (ref 8–23)
CALCIUM: 8.4 mg/dL — AB (ref 8.9–10.3)
CO2: 24 mmol/L (ref 22–32)
Chloride: 105 mmol/L (ref 98–111)
Creatinine, Ser: 0.81 mg/dL (ref 0.44–1.00)
GFR calc Af Amer: >60 mL/min (ref >60)
GFR calc non Af Amer: >60 mL/min (ref >60)
Glucose, Bld: 211 mg/dL — ABNORMAL HIGH (ref 70–99)
Potassium: 3.5 mmol/L (ref 3.5–5.1)
Sodium: 136 mmol/L (ref 135–145)
Total Bilirubin: 0.6 mg/dL (ref 0.3–1.2)
Total Protein: 5.6 g/dL — ABNORMAL LOW (ref 6.5–8.1)

## 2018-06-09 LAB — MRSA PCR SCREENING: MRSA by PCR: NEGATIVE

## 2018-06-09 LAB — CBC
HCT: 36.9 % (ref 36.0–46.0)
Hemoglobin: 12 g/dL (ref 12.0–15.0)
MCH: 30.1 pg (ref 26.0–34.0)
MCHC: 32.5 g/dL (ref 30.0–36.0)
MCV: 92.5 fL (ref 80.0–100.0)
Platelets: 216 10*3/uL (ref 150–400)
RBC: 3.99 MIL/uL (ref 3.87–5.11)
RDW: 15.2 % (ref 11.5–15.5)
WBC: 9.9 10*3/uL (ref 4.0–10.5)
nRBC: 0 % (ref 0.0–0.2)

## 2018-06-09 LAB — TSH: TSH: 5.368 u[IU]/mL — AB (ref 0.350–4.500)

## 2018-06-09 LAB — MAGNESIUM: Magnesium: 1.8 mg/dL (ref 1.7–2.4)

## 2018-06-09 LAB — LACTIC ACID, PLASMA: Lactic Acid, Venous: 1.1 mmol/L (ref 0.5–1.9)

## 2018-06-09 MED ORDER — GUAIFENESIN ER 600 MG PO TB12
1200.0000 mg | ORAL_TABLET | Freq: Two times a day (BID) | ORAL | Status: DC
  Administered 2018-06-09 – 2018-06-13 (×9): 1200 mg via ORAL
  Filled 2018-06-09 (×9): qty 2

## 2018-06-09 MED ORDER — SODIUM CHLORIDE 0.9 % IV SOLN
2.0000 g | INTRAVENOUS | Status: DC
  Administered 2018-06-09 – 2018-06-12 (×4): 2 g via INTRAVENOUS
  Filled 2018-06-09 (×5): qty 2

## 2018-06-09 NOTE — Plan of Care (Signed)
  Problem: Acute Rehab PT Goals(only PT should resolve) Goal: Pt Will Go Sit To Supine/Side Outcome: Progressing Flowsheets (Taken 06/09/2018 1210) Pt will go Sit to Supine/Side: with modified independence Goal: Patient Will Transfer Sit To/From Stand Outcome: Progressing Flowsheets (Taken 06/09/2018 1210) Patient will transfer sit to/from stand: with supervision Goal: Pt Will Transfer Bed To Chair/Chair To Bed Outcome: Progressing Flowsheets (Taken 06/09/2018 1210) Pt will Transfer Bed to Chair/Chair to Bed: with supervision Goal: Pt Will Ambulate Outcome: Progressing Flowsheets (Taken 06/09/2018 1210) Pt will Ambulate: 100 feet; with min guard assist; with least restrictive assistive device   12:11 PM, 06/09/18 Talbot Grumbling, DPT Physical Therapist with [facility] [phone] office

## 2018-06-09 NOTE — Evaluation (Signed)
Physical Therapy Evaluation Patient Details Name: Danielle [name] MRN: [id number] DOB: June 27, 1931 Today's Date: 06/09/2018   History of Present Illness  Danielle [name] is a 85 y.o. female with medical history significant for type 2 diabetes mellitus, acute ischemic CVA, who is admitted to [facility] on 06/08/2018 with severe sepsis due to HCAP pneumonia involving the right middle lobe after presenting from home to the [facility] emergency department complaining of shortness of breath.    Clinical Impression  Patient presents supine in bed, pleasant, with spouse at bedside and is agreeable to therapy. Patient near baseline for functional mobility and gait, slightly limited secondary to generalized weakness and fatigue with activity. Patient requires supervision assist for supine to/from sit transfers with use of bed rail and increased time to complete. Patient requires min assist for sit to/from stand transfers and stand pivot transfers without assistive device demonstrating rocking momentum with multiple attempts to rise and initial unsteadiness upon standing that resolves with time. Patient ambulates 40 feet without assistive device and with min assist demonstrating stiff trunk with decreased rotation and decreased bil upper extremity swing, flat foot posture, and reaches for guardrail in hallway and furniture in room to steady self. Therapist educates patient on use of RW or SPC to improve balance with activity, but patient requests to not use assistive device and reports unsteadiness is secondary to not ambulating since admission. Patient reports spouse assists with mobility and ADLs at home and current participation in outpatient PT and wanting to get back to that upon discharge. Patient left up in chair with call bell in lap, spouse at bedside, and nurse in room. Patient will benefit from continued physical therapy in hospital and recommended venue below to increase strength,  balance, endurance for safe ADLs and gait.     Follow Up Recommendations Outpatient PT;Supervision/Assistance - 24 hour;Supervision for mobility/OOB(Currently attending OPPT, missed appointment yesterday due to hospitalization)    Equipment Recommendations  None recommended by PT    Recommendations for Other Services       Precautions / Restrictions Precautions Precautions: Fall Restrictions Weight Bearing Restrictions: No      Mobility  Bed Mobility Overal bed mobility: Needs Assistance Bed Mobility: Supine to Sit     Supine to sit: Supervision     General bed mobility comments: increased time, use of bedrail   Transfers Overall transfer level: Needs assistance Equipment used: None Transfers: Sit to/from Stand;Stand Pivot Transfers Sit to Stand: Min assist Stand pivot transfers: Min assist       General transfer comment: increased time, initial unsteadiness upon standing resolving with time  Ambulation/Gait Ambulation/Gait assistance: Min assist Gait Distance (Feet): 40 Feet Assistive device: None Gait Pattern/deviations: Decreased step length - right;Decreased step length - left;Decreased stride length Gait velocity: decreased   General Gait Details: stiff trunk demonstrating decreased bil upper extremity swing and decreased trunk rotation with flat foot posture throughout gait cycle without loss of balance, attempts to grab for guardrail in hallway and furniture in room to steady self, refuses to use RW  Stairs            Wheelchair Mobility    Modified Rankin (Stroke Patients Only)       Balance Overall balance assessment: Needs assistance Sitting-balance support: Feet supported;No upper extremity supported Sitting balance-Leahy Scale: Good Sitting balance - Comments: seated edge of bed   Standing balance support: During functional activity;No upper extremity supported Standing balance-Leahy Scale: Poor Standing balance comment: fair/poor  without  assistive device attempting to hold onto furniture, requesting to not use assistive device for steadying assistance                             Pertinent Vitals/Pain Pain Assessment: No/denies pain    Home Living Family/patient expects to be discharged to:: Private residence Living Arrangements: Spouse/significant other Available Help at Discharge: Family;Available 24 hours/day Type of Home: House Home Access: Stairs to enter Entrance Stairs-Rails: Right Entrance Stairs-Number of Steps: 2 Home Layout: Two level;Able to live on main level with bedroom/bathroom Home Equipment: Gilford Rile - 2 wheels;Cane - single point;Bedside commode;Grab bars - tub/shower;Wheelchair - manual      Prior Function Level of Independence: Independent with assistive device(s)         Comments: Patient ambualtes household and community distances without assistive device, husband provides transportation into community and assistance with cooking, cleaning, dressing, and occasional phyiscal assistance     Hand Dominance        Extremity/Trunk Assessment   Upper Extremity Assessment Upper Extremity Assessment: Generalized weakness    Lower Extremity Assessment Lower Extremity Assessment: Generalized weakness    Cervical / Trunk Assessment Cervical / Trunk Assessment: Normal  Communication   Communication: No difficulties  Cognition Arousal/Alertness: Awake/alert Behavior During Therapy: WFL for tasks assessed/performed Overall Cognitive Status: Within Functional Limits for tasks assessed                                        General Comments      Exercises     Assessment/Plan    PT Assessment Patient needs continued PT services  PT Problem List Decreased strength;Decreased activity tolerance;Decreased balance;Decreased mobility       PT Treatment Interventions Gait training;Functional mobility training;Therapeutic activities;Therapeutic  exercise;Patient/family education    PT Goals (Current goals can be found in the Care Plan section)  Acute Rehab PT Goals Patient Stated Goal: return home with spouse to assist PT Goal Formulation: With patient/family Time For Goal Achievement: 06/16/18 Potential to Achieve Goals: Good    Frequency Min 2X/week   Barriers to discharge        Co-evaluation               AM-PAC PT "6 Clicks" Mobility  Outcome Measure Help needed turning from your back to your side while in a flat bed without using bedrails?: None Help needed moving from lying on your back to sitting on the side of a flat bed without using bedrails?: A Little(increased time, use of bedrail) Help needed moving to and from a bed to a chair (including a wheelchair)?: A Little Help needed standing up from a chair using your arms (e.g., wheelchair or bedside chair)?: A Little Help needed to walk in hospital room?: A Little Help needed climbing 3-5 steps with a railing? : A Lot 6 Click Score: 18    End of Session Equipment Utilized During Treatment: Gait belt Activity Tolerance: Patient tolerated treatment well;Patient limited by fatigue Patient left: in chair;with call bell/phone within reach;with nursing/sitter in room;with family/visitor present(Spouse at bedside) Nurse Communication: Mobility status PT Visit Diagnosis: Unsteadiness on feet (R26.81);Other abnormalities of gait and mobility (R26.89);Muscle weakness (generalized) (M62.81)    Time: 6237-6283 PT Time Calculation (min) (ACUTE ONLY): 21 min   Charges:   PT Evaluation $PT Eval Moderate Complexity: 1 Mod PT Treatments $Gait Training:  8-22 mins        12:09 PM, 06/09/18 Talbot Grumbling, DPT Physical Therapist with [facility] [phone] office

## 2018-06-09 NOTE — Progress Notes (Signed)
Pharmacy Antibiotic Note  Danielle [name] is a 85 y.o. female admitted on 06/08/2018 with pneumonia.  Pharmacy has been consulted for cefepime dosing.  Plan: Change cefepime 2gm IV q24h F/U cxs and clinical progress Monitor V/S, labs   Height: 5\' 2"  (157.5 cm) Weight: 135 lb 9.3 oz (61.5 kg) IBW/kg (Calculated) : 50.1  Temp (24hrs), Avg:99.7 F (37.6 C), Min:98.3 F (36.8 C), Max:101.2 F (38.4 C)  Recent Labs  Lab 06/08/18 1320 06/08/18 1657 06/08/18 1841 06/09/18 0035 06/09/18 0543  WBC 12.9*  --   --   --  9.9  CREATININE 0.90  --   --   --  0.81  LATICACIDVEN  --  1.9 2.1* 1.1  --     Estimated Creatinine Clearance: 44.6 mL/min (by C-G formula based on SCr of 0.81 mg/dL).    Allergies  Allergen Reactions  . Fosamax [Alendronate Sodium] Other (See Comments)    MUSCLE ACHES  . Prednisone     Feels jittery and nauseous    Antimicrobials this admission: 1/21 cefepime >>   1/21 azithromycin >>  1/21 ceftriaxone x 1   Dose adjustments: 1/22 Change cefepime 2gm IV q24h  Microbiology results: 1/21 BCx: ngtd 1/21 MRSA PCR: negative  Thank you for allowing pharmacy to be a part of this patient's care.  Isac Sarna, BS [place], [place] Clinical Pharmacist Pager [phone] 06/09/2018 12:16 PM

## 2018-06-09 NOTE — ED Notes (Signed)
Lab called the ED an advised that the lab analyzer is down at this time causing a delay in the patient's last drawn lactic acid results.

## 2018-06-09 NOTE — Progress Notes (Signed)
Subjective: She was admitted with pneumonia and sepsis.  She still feels very weak.  She has no other new complaints.  She is coughing nonproductively.  Objective: Vital signs in last 24 hours: Temp:  [98.3 F (36.8 C)-101.2 F (38.4 C)] 98.3 F (36.8 C) (01/22 0751) Pulse Rate:  [72-98] 72 (01/22 0751) Resp:  [16-28] 19 (01/22 0751) BP: (126-182)/(49-78) 126/52 (01/22 0751) SpO2:  [94 %-97 %] 96 % (01/22 0751) Weight:  [57.2 kg-61.5 kg] 61.5 kg (01/22 0531) Weight change:  Last BM Date: 06/06/18  Intake/Output from previous day: 01/21 0701 - 01/22 0700 In: 1367.9 [P.O.:120; I.V.:700.1; IV Piggyback:547.8] Out: -   PHYSICAL EXAM General appearance: alert, cooperative and mild distress Resp: rhonchi Right greater than left and Some wheezing bilaterally Cardio: regular rate and rhythm, S1, S2 normal, no murmur, click, rub or gallop GI: soft, non-tender; bowel sounds normal; no masses,  no organomegaly Extremities: extremities normal, atraumatic, no cyanosis or edema  Lab Results:  Results for orders placed or performed during the hospital encounter of 06/08/18 (from the past 48 hour(s))  Urinalysis, Routine w reflex microscopic     Status: Abnormal   Collection Time: 06/08/18  1:00 PM  Result Value Ref Range   Color, Urine AMBER (A) YELLOW    Comment: BIOCHEMICALS MAY BE AFFECTED BY COLOR   APPearance HAZY (A) CLEAR   Specific Gravity, Urine 1.032 (H) 1.005 - 1.030   pH 5.0 5.0 - 8.0   Glucose, UA >=500 (A) NEGATIVE mg/dL   Hgb urine dipstick MODERATE (A) NEGATIVE   Bilirubin Urine NEGATIVE NEGATIVE   Ketones, ur 20 (A) NEGATIVE mg/dL   Protein, ur 30 (A) NEGATIVE mg/dL   Nitrite NEGATIVE NEGATIVE   Leukocytes, UA NEGATIVE NEGATIVE   RBC / HPF 11-20 0 - 5 RBC/hpf   WBC, UA 0-5 0 - 5 WBC/hpf   Bacteria, UA RARE (A) NONE SEEN   Squamous Epithelial / LPF 0-5 0 - 5   Mucus PRESENT     Comment: Performed at [facility], [address] [place], [place] [address]   Comprehensive metabolic panel     Status: Abnormal   Collection Time: 06/08/18  1:20 PM  Result Value Ref Range   Sodium 135 135 - 145 mmol/L   Potassium 3.9 3.5 - 5.1 mmol/L   Chloride 101 98 - 111 mmol/L   CO2 24 22 - 32 mmol/L   Glucose, Bld 308 (H) 70 - 99 mg/dL   BUN 16 8 - 23 mg/dL   Creatinine, Ser 0.90 0.44 - 1.00 mg/dL   Calcium 9.3 8.9 - 10.3 mg/dL   Total Protein 6.8 6.5 - 8.1 g/dL   Albumin 3.5 3.5 - 5.0 g/dL   AST 56 (H) 15 - 41 U/L   ALT 49 (H) 0 - 44 U/L   Alkaline Phosphatase 144 (H) 38 - 126 U/L   Total Bilirubin 1.2 0.3 - 1.2 mg/dL   GFR calc non Af Amer 59 (L) >60 mL/min   GFR calc Af Amer >60 >60 mL/min   Anion gap 10 5 - 15    Comment: Performed at [facility], [address], [place], [place] [address]  CBC with Differential     Status: Abnormal   Collection Time: 06/08/18  1:20 PM  Result Value Ref Range   WBC 12.9 (H) 4.0 - 10.5 K/uL   RBC 4.63 3.87 - 5.11 MIL/uL   Hemoglobin 14.1 12.0 - 15.0 g/dL   HCT 43.5 36.0 - 46.0 %  MCV 94.0 80.0 - 100.0 fL   MCH 30.5 26.0 - 34.0 pg   MCHC 32.4 30.0 - 36.0 g/dL   RDW 15.4 11.5 - 15.5 %   Platelets 217 150 - 400 K/uL   nRBC 0.0 0.0 - 0.2 %   Neutrophils Relative % 79 %   Neutro Abs 10.1 (H) 1.7 - 7.7 K/uL   Lymphocytes Relative 11 %   Lymphs Abs 1.5 0.7 - 4.0 K/uL   Monocytes Relative 8 %   Monocytes Absolute 1.1 (H) 0.1 - 1.0 K/uL   Eosinophils Relative 1 %   Eosinophils Absolute 0.1 0.0 - 0.5 K/uL   Basophils Relative 0 %   Basophils Absolute 0.1 0.0 - 0.1 K/uL   Immature Granulocytes 1 %   Abs Immature Granulocytes 0.07 0.00 - 0.07 K/uL    Comment: Performed at [facility], [address] [place], [place] [address]  Blood Culture (routine x 2)     Status: None (Preliminary result)   Collection Time: 06/08/18  4:35 PM  Result Value Ref Range   Specimen Description BLOOD RIGHT FOREARM DRAWN BY RN    Special Requests      BOTTLES DRAWN AEROBIC AND ANAEROBIC Blood Culture results may not be optimal  due to an inadequate volume of blood received in culture bottles   Culture      NO GROWTH < 24 HOURS Performed at [facility], [address], [place], [place] [address]    Report Status PENDING   Lactic acid, plasma     Status: None   Collection Time: 06/08/18  4:57 PM  Result Value Ref Range   Lactic Acid, Venous 1.9 0.5 - 1.9 mmol/L    Comment: Performed at [facility], [address] [place], [place] [address]  Blood Culture (routine x 2)     Status: None (Preliminary result)   Collection Time: 06/08/18  4:58 PM  Result Value Ref Range   Specimen Description BLOOD RIGHT HAND    Special Requests      BOTTLES DRAWN AEROBIC ONLY Blood Culture results may not be optimal due to an inadequate volume of blood received in culture bottles   Culture      NO GROWTH < 24 HOURS Performed at [facility], [address], [place], [place] [address]    Report Status PENDING   Lactic acid, plasma     Status: Abnormal   Collection Time: 06/08/18  6:41 PM  Result Value Ref Range   Lactic Acid, Venous 2.1 (HH) 0.5 - 1.9 mmol/L    Comment: CRITICAL RESULT CALLED TO, READ BACK BY AND VERIFIED WITH: HARDEN,J ON 06/08/18 AT 1940 BY LOY,C Performed at [facility], [address], [place], [place] [address]   Influenza panel by PCR (type A & B)     Status: None   Collection Time: 06/08/18 10:00 PM  Result Value Ref Range   Influenza A By PCR NEGATIVE NEGATIVE   Influenza B By PCR NEGATIVE NEGATIVE    Comment: (NOTE) The Xpert Xpress Flu assay is intended as an aid in the diagnosis of  influenza and should not be used as a sole basis for treatment.  This  assay is FDA approved for nasopharyngeal swab specimens only. Nasal  washings and aspirates are unacceptable for Xpert Xpress Flu testing. Performed at [facility], [address], [place], [place] [address]   CBG monitoring, ED     Status: Abnormal   Collection Time: 06/08/18 10:00 PM  Result Value  Ref Range   Glucose-Capillary 285 (H) 70 - 99  mg/dL  MRSA PCR Screening     Status: None   Collection Time: 06/08/18 10:57 PM  Result Value Ref Range   MRSA by PCR NEGATIVE NEGATIVE    Comment:        The GeneXpert MRSA Assay (FDA approved for NASAL specimens only), is one component of a comprehensive MRSA colonization surveillance program. It is not intended to diagnose MRSA infection nor to guide or monitor treatment for MRSA infections. Performed at [facility], [address] [place], [place] [address]   TSH     Status: Abnormal   Collection Time: 06/09/18 12:35 AM  Result Value Ref Range   TSH 5.368 (H) 0.350 - 4.500 uIU/mL    Comment: Performed by a 3rd Generation assay with a functional sensitivity of <=0.01 uIU/mL. Performed at [facility], [address], [place], [place] [address]   Lactic acid, plasma     Status: None   Collection Time: 06/09/18 12:35 AM  Result Value Ref Range   Lactic Acid, Venous 1.1 0.5 - 1.9 mmol/L    Comment: Performed at [facility], [address], [place], [place] [address]  Magnesium     Status: None   Collection Time: 06/09/18  5:43 AM  Result Value Ref Range   Magnesium 1.8 1.7 - 2.4 mg/dL    Comment: Performed at [facility], [address], [place], [place] [address]  Comprehensive metabolic panel     Status: Abnormal   Collection Time: 06/09/18  5:43 AM  Result Value Ref Range   Sodium 136 135 - 145 mmol/L   Potassium 3.5 3.5 - 5.1 mmol/L   Chloride 105 98 - 111 mmol/L   CO2 24 22 - 32 mmol/L   Glucose, Bld 211 (H) 70 - 99 mg/dL   BUN 16 8 - 23 mg/dL   Creatinine, Ser 0.81 0.44 - 1.00 mg/dL   Calcium 8.4 (L) 8.9 - 10.3 mg/dL   Total Protein 5.6 (L) 6.5 - 8.1 g/dL   Albumin 2.7 (L) 3.5 - 5.0 g/dL   AST 35 15 - 41 U/L   ALT 33 0 - 44 U/L   Alkaline Phosphatase 112 38 - 126 U/L   Total Bilirubin 0.6 0.3 - 1.2 mg/dL   GFR calc non Af Amer >60 >60 mL/min   GFR calc Af Amer >60 >60 mL/min   Anion gap 7 5 - 15    Comment: Performed at [facility], [address]  [place], [place] [address]  CBC     Status: None   Collection Time: 06/09/18  5:43 AM  Result Value Ref Range   WBC 9.9 4.0 - 10.5 K/uL   RBC 3.99 3.87 - 5.11 MIL/uL   Hemoglobin 12.0 12.0 - 15.0 g/dL   HCT 36.9 36.0 - 46.0 %   MCV 92.5 80.0 - 100.0 fL   MCH 30.1 26.0 - 34.0 pg   MCHC 32.5 30.0 - 36.0 g/dL   RDW 15.2 11.5 - 15.5 %   Platelets 216 150 - 400 K/uL   nRBC 0.0 0.0 - 0.2 %    Comment: Performed at [facility], [address] [place], [place] [address]  Glucose, capillary     Status: Abnormal   Collection Time: 06/09/18  7:33 AM  Result Value Ref Range   Glucose-Capillary 193 (H) 70 - 99 mg/dL   Comment 1 Notify RN    Comment 2 Document in Chart  ABGS No results for input(s): PHART, PO2ART, TCO2, HCO3 in the last 72 hours.  Invalid input(s): PCO2 CULTURES Recent Results (from the past 240 hour(s))  Blood Culture (routine x 2)     Status: None (Preliminary result)   Collection Time: 06/08/18  4:35 PM  Result Value Ref Range Status   Specimen Description BLOOD RIGHT FOREARM DRAWN BY RN  Final   Special Requests   Final    BOTTLES DRAWN AEROBIC AND ANAEROBIC Blood Culture results may not be optimal due to an inadequate volume of blood received in culture bottles   Culture   Final    NO GROWTH < 24 HOURS Performed at [facility], [address], [place], [place] [address]    Report Status PENDING  Incomplete  Blood Culture (routine x 2)     Status: None (Preliminary result)   Collection Time: 06/08/18  4:58 PM  Result Value Ref Range Status   Specimen Description BLOOD RIGHT HAND  Final   Special Requests   Final    BOTTLES DRAWN AEROBIC ONLY Blood Culture results may not be optimal due to an inadequate volume of blood received in culture bottles   Culture   Final    NO GROWTH < 24 HOURS Performed at [facility], [address] [place], [place] [address]    Report Status PENDING  Incomplete  MRSA PCR Screening     Status: None   Collection Time: 06/08/18 10:57  PM  Result Value Ref Range Status   MRSA by PCR NEGATIVE NEGATIVE Final    Comment:        The GeneXpert MRSA Assay (FDA approved for NASAL specimens only), is one component of a comprehensive MRSA colonization surveillance program. It is not intended to diagnose MRSA infection nor to guide or monitor treatment for MRSA infections. Performed at [facility], [address], [place], [place] [address]    Studies/Results: Dg Chest 2 View  Result Date: 06/08/2018 CLINICAL DATA:  Dry cough for 1 week.  Fever and weakness. EXAM: CHEST - 2 VIEW COMPARISON:  Chest x-ray 02/06/2017 FINDINGS: The cardiac silhouette, mediastinal and hilar contours are within normal limits and stable. There is moderate tortuosity and calcification of the thoracic aorta. Increased density noted in the right middle lobe, partially obscuring the right heart border and more evident on the lateral film. Findings consistent with a right middle lobe infiltrate/pneumonia. The bony thorax is intact. 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 IMPRESSION: Right middle lobe pneumonia. Followup PA and lateral chest X-ray is recommended in 3-4 weeks following trial of antibiotic therapy to ensure resolution and exclude underlying malignancy. Electronically Signed   By: Marijo Sanes M.D.   On: 06/08/2018 13:24    Medications:  Prior to Admission:  Medications Prior to Admission  Medication Sig Dispense Refill Last Dose  . amoxicillin (AMOXIL) 500 MG capsule Take 500 mg by mouth 3 (three) times daily.   06/08/2018 at Unknown time  . aspirin EC 81 MG tablet Take 81 mg by mouth daily.   06/07/2018 at Unknown time  . atorvastatin (LIPITOR) 40 MG tablet Take 1 tablet (40 mg total) by mouth daily at 6 PM. 30 tablet 0 06/07/2018 at Unknown time  . Calcium Carb-Cholecalciferol (CALCIUM 600 + D PO) Take 1 tablet by mouth daily.   Past Week at Unknown time  . clopidogrel (PLAVIX) 75  MG tablet Take 1 tablet (75 mg total) by mouth daily with breakfast. 30  tablet 0 06/07/2018 at Unknown time  . Insulin Glargine (TOUJEO MAX SOLOSTAR) 300 UNIT/ML SOPN Inject 22 Units into the skin daily. (Patient taking differently: Inject 18-22 Units into the skin daily. PER SLIDING SCALE) 3 mL 0 06/08/2018 at Unknown time  . insulin lispro (HUMALOG) 100 UNIT/ML injection Inject 8-10 Units into the skin 2 (two) times daily.    06/07/2018 at Unknown time  . levothyroxine (SYNTHROID, LEVOTHROID) 88 MCG tablet Take 88 mcg by mouth daily before breakfast. BRAND ONLY   06/07/2018 at Unknown time  . Multiple Vitamin (MULTIVITAMIN WITH MINERALS) TABS Take 1 tablet by mouth daily.    Past Week at Unknown time  . multivitamin-lutein (OCUVITE-LUTEIN) CAPS capsule Take 1 capsule by mouth daily.   06/07/2018 at Unknown time  . Omega-3 Fatty Acids (FISH OIL) 1000 MG CAPS Take 1,000 mg by mouth daily.   Past Week at Unknown time  . vitamin B-12 (CYANOCOBALAMIN) 1000 MCG tablet Take 1,000 mcg by mouth daily.   Past Week at Unknown time   Scheduled: . aspirin EC  81 mg Oral Daily  . atorvastatin  40 mg Oral q1800  . chlorhexidine  15 mL Mouth Rinse BID  . clopidogrel  75 mg Oral Q breakfast  . insulin aspart  0-20 Units Subcutaneous TID WC  . insulin aspart  0-5 Units Subcutaneous QHS  . insulin detemir  12 Units Subcutaneous Q breakfast  . levothyroxine  88 mcg Oral Q0600  . mouth rinse  15 mL Mouth Rinse q12n4p   Continuous: . sodium chloride 75 mL/hr at 06/08/18 2031  . azithromycin    . ceFEPime (MAXIPIME) IV 1 g (06/09/18 4401)   [id number] **OR** acetaminophen, ondansetron **OR** ondansetron (ZOFRAN) IV  Assesment: She was admitted with healthcare associated pneumonia and she is on appropriate treatment.  She had severe sepsis on admission and that seems to have resolved with fluid resuscitation.  She has generalized weakness which was present before the pneumonia.  She had recent stroke but  seems to be doing okay from that  She has diabetes which is being treated with sliding scale and basal insulin Principal Problem:   HCAP (healthcare-associated pneumonia) Active Problems:   Type 2 diabetes mellitus with hypoglycemia without coma (HCC)   Hypothyroidism (acquired)   Right middle lobe pneumonia (HCC)   Severe sepsis (HCC)   Generalized weakness    Plan: Continue treatments.  She still very weak and still coughing nonproductively.    LOS: 1 day   Alonza Bogus 06/09/2018, 8:39 AM

## 2018-06-10 LAB — GLUCOSE, CAPILLARY
Glucose-Capillary: 164 mg/dL — ABNORMAL HIGH (ref 70–99)
Glucose-Capillary: 129 mg/dL — ABNORMAL HIGH (ref 70–99)
Glucose-Capillary: 87 mg/dL (ref 70–99)
Glucose-Capillary: 137 mg/dL — ABNORMAL HIGH (ref 70–99)

## 2018-06-10 MED ORDER — INSULIN ASPART 100 UNIT/ML ~~LOC~~ SOLN: 0.0000 [IU] | Freq: Every day | SUBCUTANEOUS | Status: DC

## 2018-06-10 MED ORDER — INSULIN ASPART 100 UNIT/ML ~~LOC~~ SOLN: 0.0000 [IU] | Freq: Three times a day (TID) | SUBCUTANEOUS | Status: DC

## 2018-06-10 MED ORDER — INSULIN ASPART 100 UNIT/ML ~~LOC~~ SOLN
0.0000 [IU] | Freq: Three times a day (TID) | SUBCUTANEOUS | Status: DC
  Administered 2018-06-10: 1 [IU] via SUBCUTANEOUS
  Administered 2018-06-11: 2 [IU] via SUBCUTANEOUS
  Administered 2018-06-11: 7 [IU] via SUBCUTANEOUS
  Administered 2018-06-11 – 2018-06-12 (×2): 3 [IU] via SUBCUTANEOUS
  Administered 2018-06-12 – 2018-06-13 (×3): 2 [IU] via SUBCUTANEOUS

## 2018-06-10 NOTE — Progress Notes (Signed)
Subjective: She says she feels a little bit better but she is still very short of breath coughing and congested.  She still feels weak.  No other new complaints now.  Objective: Vital signs in last 24 hours: Temp:  [98.6 F (37 C)-100.7 F (38.2 C)] 99.5 F (37.5 C) (01/23 0806) Pulse Rate:  [68-84] 70 (01/23 0806) Resp:  [16-20] 18 (01/23 0552) BP: (137-155)/(50-71) 151/58 (01/23 0806) SpO2:  [92 %-97 %] 96 % (01/23 0806) Weight change:  Last BM Date: 06/09/18  Intake/Output from previous day: 01/22 0701 - 01/23 0700 In: 2381.3 [P.O.:600; I.V.:1431.4; IV Piggyback:349.9] Out: 102 [Urine:101; Stool:1]  PHYSICAL EXAM General appearance: alert, cooperative and mild distress Resp: rhonchi Right greater than left Cardio: regular rate and rhythm, S1, S2 normal, no murmur, click, rub or gallop GI: soft, non-tender; bowel sounds normal; no masses,  no organomegaly Extremities: extremities normal, atraumatic, no cyanosis or edema  Lab Results:  Results for orders placed or performed during the hospital encounter of 06/08/18 (from the past 48 hour(s))  Urinalysis, Routine w reflex microscopic     Status: Abnormal   Collection Time: 06/08/18  1:00 PM  Result Value Ref Range   Color, Urine AMBER (A) YELLOW    Comment: BIOCHEMICALS MAY BE AFFECTED BY COLOR   APPearance HAZY (A) CLEAR   Specific Gravity, Urine 1.032 (H) 1.005 - 1.030   pH 5.0 5.0 - 8.0   Glucose, UA >=500 (A) NEGATIVE mg/dL   Hgb urine dipstick MODERATE (A) NEGATIVE   Bilirubin Urine NEGATIVE NEGATIVE   Ketones, ur 20 (A) NEGATIVE mg/dL   Protein, ur 30 (A) NEGATIVE mg/dL   Nitrite NEGATIVE NEGATIVE   Leukocytes, UA NEGATIVE NEGATIVE   RBC / HPF 11-20 0 - 5 RBC/hpf   WBC, UA 0-5 0 - 5 WBC/hpf   Bacteria, UA RARE (A) NONE SEEN   Squamous Epithelial / LPF 0-5 0 - 5   Mucus PRESENT     Comment: Performed at [facility], [address] [place], [place] [address]  Comprehensive metabolic panel     Status: Abnormal    Collection Time: 06/08/18  1:20 PM  Result Value Ref Range   Sodium 135 135 - 145 mmol/L   Potassium 3.9 3.5 - 5.1 mmol/L   Chloride 101 98 - 111 mmol/L   CO2 24 22 - 32 mmol/L   Glucose, Bld 308 (H) 70 - 99 mg/dL   BUN 16 8 - 23 mg/dL   Creatinine, Ser 0.90 0.44 - 1.00 mg/dL   Calcium 9.3 8.9 - 10.3 mg/dL   Total Protein 6.8 6.5 - 8.1 g/dL   Albumin 3.5 3.5 - 5.0 g/dL   AST 56 (H) 15 - 41 U/L   ALT 49 (H) 0 - 44 U/L   Alkaline Phosphatase 144 (H) 38 - 126 U/L   Total Bilirubin 1.2 0.3 - 1.2 mg/dL   GFR calc non Af Amer 59 (L) >60 mL/min   GFR calc Af Amer >60 >60 mL/min   Anion gap 10 5 - 15    Comment: Performed at [facility], [address], [place], [place] [address]  CBC with Differential     Status: Abnormal   Collection Time: 06/08/18  1:20 PM  Result Value Ref Range   WBC 12.9 (H) 4.0 - 10.5 K/uL   RBC 4.63 3.87 - 5.11 MIL/uL   Hemoglobin 14.1 12.0 - 15.0 g/dL   HCT 43.5 36.0 - 46.0 %   MCV 94.0 80.0 - 100.0  fL   MCH 30.5 26.0 - 34.0 pg   MCHC 32.4 30.0 - 36.0 g/dL   RDW 15.4 11.5 - 15.5 %   Platelets 217 150 - 400 K/uL   nRBC 0.0 0.0 - 0.2 %   Neutrophils Relative % 79 %   Neutro Abs 10.1 (H) 1.7 - 7.7 K/uL   Lymphocytes Relative 11 %   Lymphs Abs 1.5 0.7 - 4.0 K/uL   Monocytes Relative 8 %   Monocytes Absolute 1.1 (H) 0.1 - 1.0 K/uL   Eosinophils Relative 1 %   Eosinophils Absolute 0.1 0.0 - 0.5 K/uL   Basophils Relative 0 %   Basophils Absolute 0.1 0.0 - 0.1 K/uL   Immature Granulocytes 1 %   Abs Immature Granulocytes 0.07 0.00 - 0.07 K/uL    Comment: Performed at [facility], [address], [place], [place] [address]  Blood Culture (routine x 2)     Status: None (Preliminary result)   Collection Time: 06/08/18  4:35 PM  Result Value Ref Range   Specimen Description BLOOD RIGHT FOREARM DRAWN BY RN    Special Requests      BOTTLES DRAWN AEROBIC AND ANAEROBIC Blood Culture results may not be optimal due to an inadequate volume of blood received in  culture bottles   Culture      NO GROWTH < 24 HOURS Performed at [facility], [address], [place], [place] [address]    Report Status PENDING   Lactic acid, plasma     Status: None   Collection Time: 06/08/18  4:57 PM  Result Value Ref Range   Lactic Acid, Venous 1.9 0.5 - 1.9 mmol/L    Comment: Performed at [facility], [address] [place], [place] [address]  Blood Culture (routine x 2)     Status: None (Preliminary result)   Collection Time: 06/08/18  4:58 PM  Result Value Ref Range   Specimen Description BLOOD RIGHT HAND    Special Requests      BOTTLES DRAWN AEROBIC ONLY Blood Culture results may not be optimal due to an inadequate volume of blood received in culture bottles   Culture      NO GROWTH < 24 HOURS Performed at [facility], [address], [place], [place] [address]    Report Status PENDING   Lactic acid, plasma     Status: Abnormal   Collection Time: 06/08/18  6:41 PM  Result Value Ref Range   Lactic Acid, Venous 2.1 (HH) 0.5 - 1.9 mmol/L    Comment: CRITICAL RESULT CALLED TO, READ BACK BY AND VERIFIED WITH: HARDEN,J ON 06/08/18 AT 1940 BY LOY,C Performed at [facility], [address], [place], [place] [address]   Influenza panel by PCR (type A & B)     Status: None   Collection Time: 06/08/18 10:00 PM  Result Value Ref Range   Influenza A By PCR NEGATIVE NEGATIVE   Influenza B By PCR NEGATIVE NEGATIVE    Comment: (NOTE) The Xpert Xpress Flu assay is intended as an aid in the diagnosis of  influenza and should not be used as a sole basis for treatment.  This  assay is FDA approved for nasopharyngeal swab specimens only. Nasal  washings and aspirates are unacceptable for Xpert Xpress Flu testing. Performed at [facility], [address], [place], [place] [address]   CBG monitoring, ED     Status: Abnormal   Collection Time: 06/08/18 10:00 PM  Result Value Ref Range   Glucose-Capillary  285 (H) 70 - 99 mg/dL  MRSA PCR Screening     Status: None    Collection Time: 06/08/18 10:57 PM  Result Value Ref Range   MRSA by PCR NEGATIVE NEGATIVE    Comment:        The GeneXpert MRSA Assay (FDA approved for NASAL specimens only), is one component of a comprehensive MRSA colonization surveillance program. It is not intended to diagnose MRSA infection nor to guide or monitor treatment for MRSA infections. Performed at [facility], [address], [place], [place] [address]   TSH     Status: Abnormal   Collection Time: 06/09/18 12:35 AM  Result Value Ref Range   TSH 5.368 (H) 0.350 - 4.500 uIU/mL    Comment: Performed by a 3rd Generation assay with a functional sensitivity of <=0.01 uIU/mL. Performed at [facility], [address] [place], [place] [address]   Lactic acid, plasma     Status: None   Collection Time: 06/09/18 12:35 AM  Result Value Ref Range   Lactic Acid, Venous 1.1 0.5 - 1.9 mmol/L    Comment: Performed at [facility], [address], [place], [place] [address]  Magnesium     Status: None   Collection Time: 06/09/18  5:43 AM  Result Value Ref Range   Magnesium 1.8 1.7 - 2.4 mg/dL    Comment: Performed at [facility], [address] [place], [place] [address]  Comprehensive metabolic panel     Status: Abnormal   Collection Time: 06/09/18  5:43 AM  Result Value Ref Range   Sodium 136 135 - 145 mmol/L   Potassium 3.5 3.5 - 5.1 mmol/L   Chloride 105 98 - 111 mmol/L   CO2 24 22 - 32 mmol/L   Glucose, Bld 211 (H) 70 - 99 mg/dL   BUN 16 8 - 23 mg/dL   Creatinine, Ser 0.81 0.44 - 1.00 mg/dL   Calcium 8.4 (L) 8.9 - 10.3 mg/dL   Total Protein 5.6 (L) 6.5 - 8.1 g/dL   Albumin 2.7 (L) 3.5 - 5.0 g/dL   AST 35 15 - 41 U/L   ALT 33 0 - 44 U/L   Alkaline Phosphatase 112 38 - 126 U/L   Total Bilirubin 0.6 0.3 - 1.2 mg/dL   GFR calc non Af Amer >60 >60 mL/min   GFR calc Af Amer >60 >60 mL/min   Anion gap 7 5 - 15    Comment: Performed at [facility], [address], [place], [place] [address]  CBC     Status: None    Collection Time: 06/09/18  5:43 AM  Result Value Ref Range   WBC 9.9 4.0 - 10.5 K/uL   RBC 3.99 3.87 - 5.11 MIL/uL   Hemoglobin 12.0 12.0 - 15.0 g/dL   HCT 36.9 36.0 - 46.0 %   MCV 92.5 80.0 - 100.0 fL   MCH 30.1 26.0 - 34.0 pg   MCHC 32.5 30.0 - 36.0 g/dL   RDW 15.2 11.5 - 15.5 %   Platelets 216 150 - 400 K/uL   nRBC 0.0 0.0 - 0.2 %    Comment: Performed at [facility], [address] [place], [place] [address]  Glucose, capillary     Status: Abnormal   Collection Time: 06/09/18  7:33 AM  Result Value Ref Range   Glucose-Capillary 193 (H) 70 - 99 mg/dL   Comment 1 Notify RN    Comment 2 Document in Chart   Glucose, capillary  Status: Abnormal   Collection Time: 06/09/18 11:17 AM  Result Value Ref Range   Glucose-Capillary 198 (H) 70 - 99 mg/dL   Comment 1 Notify RN    Comment 2 Document in Chart   Glucose, capillary     Status: Abnormal   Collection Time: 06/09/18  4:40 PM  Result Value Ref Range   Glucose-Capillary 52 (L) 70 - 99 mg/dL   Comment 1 Notify RN    Comment 2 Document in Chart    Comment 3 Repeat Test   Glucose, capillary     Status: Abnormal   Collection Time: 06/09/18  6:21 PM  Result Value Ref Range   Glucose-Capillary 186 (H) 70 - 99 mg/dL   Comment 1 Notify RN    Comment 2 Document in Chart   Glucose, capillary     Status: Abnormal   Collection Time: 06/09/18  9:42 PM  Result Value Ref Range   Glucose-Capillary 186 (H) 70 - 99 mg/dL  Glucose, capillary     Status: Abnormal   Collection Time: 06/10/18  7:44 AM  Result Value Ref Range   Glucose-Capillary 164 (H) 70 - 99 mg/dL    ABGS No results for input(s): PHART, PO2ART, TCO2, HCO3 in the last 72 hours.  Invalid input(s): PCO2 CULTURES Recent Results (from the past 240 hour(s))  Blood Culture (routine x 2)     Status: None (Preliminary result)   Collection Time: 06/08/18  4:35 PM  Result Value Ref Range Status   Specimen Description BLOOD RIGHT FOREARM DRAWN BY RN  Final   Special  Requests   Final    BOTTLES DRAWN AEROBIC AND ANAEROBIC Blood Culture results may not be optimal due to an inadequate volume of blood received in culture bottles   Culture   Final    NO GROWTH < 24 HOURS Performed at [facility], [address] [place], [place] [address]    Report Status PENDING  Incomplete  Blood Culture (routine x 2)     Status: None (Preliminary result)   Collection Time: 06/08/18  4:58 PM  Result Value Ref Range Status   Specimen Description BLOOD RIGHT HAND  Final   Special Requests   Final    BOTTLES DRAWN AEROBIC ONLY Blood Culture results may not be optimal due to an inadequate volume of blood received in culture bottles   Culture   Final    NO GROWTH < 24 HOURS Performed at [facility], [address] [place], [place] [address]    Report Status PENDING  Incomplete  MRSA PCR Screening     Status: None   Collection Time: 06/08/18 10:57 PM  Result Value Ref Range Status   MRSA by PCR NEGATIVE NEGATIVE Final    Comment:        The GeneXpert MRSA Assay (FDA approved for NASAL specimens only), is one component of a comprehensive MRSA colonization surveillance program. It is not intended to diagnose MRSA infection nor to guide or monitor treatment for MRSA infections. Performed at [facility], [address], [place], [place] [address]    Studies/Results: Dg Chest 2 View  Result Date: 06/08/2018 CLINICAL DATA:  Dry cough for 1 week.  Fever and weakness. EXAM: CHEST - 2 VIEW COMPARISON:  Chest x-ray 02/06/2017 FINDINGS: The cardiac silhouette, mediastinal and hilar contours are within normal limits and stable. There is moderate tortuosity and calcification of the thoracic aorta. Increased density noted in the right middle lobe, partially obscuring the right heart border and  more evident on the lateral film. Findings consistent with a right middle lobe infiltrate/pneumonia. The bony thorax is intact. 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 Bilateral breast prostheses are noted. IMPRESSION: Right middle lobe pneumonia. Followup PA and lateral chest X-ray is recommended in 3-4 weeks following trial of antibiotic therapy to ensure resolution and exclude underlying malignancy. Electronically Signed   By: Marijo Sanes M.D.   On: 06/08/2018 13:24    Medications:  Prior to Admission:  Medications Prior to Admission  Medication Sig Dispense Refill Last Dose  . amoxicillin (AMOXIL) 500 MG capsule Take 500 mg by mouth 3 (three) times daily.   06/08/2018 at Unknown time  . aspirin EC 81 MG tablet Take 81 mg by mouth daily.   06/07/2018 at Unknown time  . atorvastatin (LIPITOR) 40 MG tablet Take 1 tablet (40 mg total) by mouth daily at 6 PM. 30 tablet 0 06/07/2018 at Unknown time  . Calcium Carb-Cholecalciferol (CALCIUM 600 + D PO) Take 1 tablet by mouth daily.   Past Week at Unknown time  . clopidogrel (PLAVIX) 75 MG tablet Take 1 tablet (75 mg total) by mouth daily with breakfast. 30 tablet 0 06/07/2018 at Unknown time  . Insulin Glargine (TOUJEO MAX SOLOSTAR) 300 UNIT/ML SOPN Inject 22 Units into the skin daily. (Patient taking differently: Inject 18-22 Units into the skin daily. PER SLIDING SCALE) 3 mL 0 06/08/2018 at Unknown time  . insulin lispro (HUMALOG) 100 UNIT/ML injection Inject 8-10 Units into the skin 2 (two) times daily.    06/07/2018 at Unknown time  . levothyroxine (SYNTHROID, LEVOTHROID) 88 MCG tablet Take 88 mcg by mouth daily before breakfast. BRAND ONLY   06/07/2018 at Unknown time  . Multiple Vitamin (MULTIVITAMIN WITH MINERALS) TABS Take 1 tablet by mouth daily.    Past Week at Unknown time  . multivitamin-lutein (OCUVITE-LUTEIN) CAPS capsule Take 1 capsule by mouth daily.   06/07/2018 at Unknown time  . Omega-3 Fatty Acids (FISH OIL) 1000 MG CAPS Take 1,000 mg by mouth daily.   Past Week at Unknown time  . vitamin B-12 (CYANOCOBALAMIN) 1000 MCG tablet Take 1,000 mcg by mouth daily.   Past Week at Unknown time    Scheduled: . aspirin EC  81 mg Oral Daily  . atorvastatin  40 mg Oral q1800  . chlorhexidine  15 mL Mouth Rinse BID  . clopidogrel  75 mg Oral Q breakfast  . guaiFENesin  1,200 mg Oral BID  . insulin aspart  0-20 Units Subcutaneous TID WC  . insulin aspart  0-5 Units Subcutaneous QHS  . insulin detemir  12 Units Subcutaneous Q breakfast  . levothyroxine  88 mcg Oral Q0600  . mouth rinse  15 mL Mouth Rinse q12n4p   Continuous: . sodium chloride Stopped (06/09/18 1823)  . azithromycin Stopped (06/09/18 1805)  . ceFEPime (MAXIPIME) IV 200 mL/hr at 06/09/18 1836   SHF:WYOVZCHYIFOYD **OR** acetaminophen, ondansetron **OR** ondansetron (ZOFRAN) IV  Assesment: She was admitted with healthcare associated pneumonia and severe sepsis.  Her pneumonia is in the right middle lobe.  She has some COPD at baseline.  She feels better but not well.  She still looks weak.  She is coughing nonproductively.  She is not ready for discharge.  She has diabetes which is well controlled  Her sepsis has resolved. Principal Problem:   HCAP (healthcare-associated pneumonia) Active Problems:   Type 2 diabetes mellitus with hypoglycemia without coma ([place])   Hypothyroidism (acquired)   Right middle lobe  pneumonia ([place])   Severe sepsis ([place])   Generalized weakness    Plan: Continue current treatments    LOS: 2 days   Alonza Bogus 06/10/2018, 8:18 AM

## 2018-06-10 NOTE — Progress Notes (Signed)
Physical Therapy Treatment Patient Details Name: Danielle [name] MRN: [id number] DOB: 1932/05/19 Today's Date: 06/10/2018    History of Present Illness TRISSA MOLINA is a 85 y.o. female with medical history significant for type 2 diabetes mellitus, acute ischemic CVA, who is admitted to [facility] on 06/08/2018 with severe sepsis due to HCAP pneumonia involving the right middle lobe after presenting from home to the [facility] emergency department complaining of shortness of breath.    PT Comments    Patient presents supine in bed with spouse at bedside and agreeable to therapy. Patient continues to perform supine to sit transfers with supervision requiring increased time and use of bed rail to assist. Patient performs sit to/from stand and stand pivot transfers without assistive device requiring min assist and uses rocking momentum, multiple attempts, and requires increased time to rise from seated surface. Patient ambulates 100 ft in hallway without assistive device per patient and spouse request, requiring min assist for steadying assistance due to patient reaching for hand rail and non fluid gait pattern. PT educates patient and patient's spouse on use of RW to improve balance, gait speed, and overall efficiency and agreeable to ambulate with RW. Patient ambulates with RW 75 ft demonstrating more fluid gait pattern, increased cadence, improved steadiness and requires min guard assist with occasional min assist for RW management veering R and L. Patient without pain complaints throughout treatment session. Patient left up in chair with spouse at bedside and call bell in reach. Patient will benefit from continued physical therapy in hospital and recommended venue below to increase strength, balance, endurance for safe ADLs and gait.    Follow Up Recommendations  Outpatient PT;Supervision/Assistance - 24 hour;Supervision for mobility/OOB(OPPT patient currently)      Equipment Recommendations  None recommended by PT    Recommendations for Other Services       Precautions / Restrictions Precautions Precautions: Fall Restrictions Weight Bearing Restrictions: No    Mobility  Bed Mobility Overal bed mobility: Needs Assistance Bed Mobility: Supine to Sit     Supine to sit: Supervision     General bed mobility comments: increased time, use of bedrail   Transfers Overall transfer level: Needs assistance Equipment used: None Transfers: Sit to/from [employer] Transfers Sit to Stand: Min assist Stand pivot transfers: Min assist       General transfer comment: uses rocking momentum and multiple attempted to rise, initial unsteadiness upon standing resolving with time  Ambulation/Gait Ambulation/Gait assistance: Min guard;Min assist Gait Distance (Feet): 100 Feet Assistive device: Rolling walker (2 wheeled);None Gait Pattern/deviations: Decreased step length - right;Decreased step length - left;Decreased stride length Gait velocity: decreased   General Gait Details: stiff trunk reaching with single upper extremity to hold onto handrail in hallway when not using RW, more steady and with improved step length and fluid gait pattern when using RW   Stairs             Wheelchair Mobility    Modified Rankin (Stroke Patients Only)       Balance Overall balance assessment: Needs assistance Sitting-balance support: Feet supported;No upper extremity supported Sitting balance-Leahy Scale: Good Sitting balance - Comments: seated edge of bed   Standing balance support: During functional activity;No upper extremity supported Standing balance-Leahy Scale: Poor Standing balance comment: fair with RW, poor without assistive device  Cognition Arousal/Alertness: Awake/alert Behavior During Therapy: WFL for tasks assessed/performed Overall Cognitive Status: Within Functional Limits for tasks  assessed                                        Exercises      General Comments        Pertinent Vitals/Pain Pain Assessment: No/denies pain    Home Living                      Prior Function            PT Goals (current goals can now be found in the care plan section) Acute Rehab PT Goals Patient Stated Goal: return home with spouse to assist PT Goal Formulation: With patient/family Time For Goal Achievement: 06/16/18 Potential to Achieve Goals: Good Progress towards PT goals: Progressing toward goals    Frequency    Min 2X/week      PT Plan Current plan remains appropriate    Co-evaluation              AM-PAC PT "6 Clicks" Mobility   Outcome Measure  Help needed turning from your back to your side while in a flat bed without using bedrails?: None Help needed moving from lying on your back to sitting on the side of a flat bed without using bedrails?: A Little(increased time, use of bedrail) Help needed moving to and from a bed to a chair (including a wheelchair)?: A Little Help needed standing up from a chair using your arms (e.g., wheelchair or bedside chair)?: A Little Help needed to walk in hospital room?: A Little Help needed climbing 3-5 steps with a railing? : A Lot 6 Click Score: 18    End of Session Equipment Utilized During Treatment: Gait belt Activity Tolerance: Patient tolerated treatment well;Patient limited by fatigue Patient left: in chair;with call bell/phone within reach;with family/visitor present(Spouse at bedside) Nurse Communication: Mobility status PT Visit Diagnosis: Unsteadiness on feet (R26.81);Other abnormalities of gait and mobility (R26.89);Muscle weakness (generalized) (M62.81)     Time: 0912-0927 PT Time Calculation (min) (ACUTE ONLY): 15 min  Charges:  $Gait Training: 8-22 mins                     10:31 AM, 06/10/18 Talbot Grumbling, DPT Physical Therapist with [facility] [phone] office

## 2018-06-10 NOTE — Progress Notes (Signed)
Inpatient Diabetes Program Recommendations  AACE/ADA: New Consensus Statement on Inpatient Glycemic Control (2015)  Target Ranges:  Prepandial:   less than 140 mg/dL      Peak postprandial:   less than 180 mg/dL (1-2 hours)      Critically ill patients:  140 - 180 mg/dL   Lab Results  Component Value Date   GLUCAP 164 (H) 06/10/2018   HGBA1C 7.9 (H) 05/03/2018    Review of Glycemic Control Results for DAVETTE, NUGENT (MRN [id number]) as of 06/10/2018 11:26  Ref. Range 06/09/2018 11:17 06/09/2018 16:40 06/09/2018 18:21 06/09/2018 21:42 06/10/2018 07:44  Glucose-Capillary Latest Ref Range: 70 - 99 mg/dL 198 (H) 52 (L) 186 (H) 186 (H) 164 (H)   Inpatient Diabetes Program Recommendations:   Noted CBG hypoglycemia of 52 @ 16:40 yesterday. Called Dr. Luan Pulling office and received verbal order to decrease Novolog correction scale to sensitive tid + hs 0-5.  Thank you, Nani Gasser. Talin Rozeboom, RN, MSN, CDE  Diabetes Coordinator Inpatient Glycemic Control Team Team Pager [phone] (8am-5pm) 06/10/2018 11:31 AM

## 2018-06-11 LAB — BASIC METABOLIC PANEL
Anion gap: 8 (ref 5–15)
BUN: 11 mg/dL (ref 8–23)
CO2: 21 mmol/L — AB (ref 22–32)
Calcium: 8.3 mg/dL — ABNORMAL LOW (ref 8.9–10.3)
Chloride: 107 mmol/L (ref 98–111)
Creatinine, Ser: 0.75 mg/dL (ref 0.44–1.00)
GFR calc Af Amer: >60 mL/min (ref >60)
GFR calc non Af Amer: >60 mL/min (ref >60)
Glucose, Bld: 159 mg/dL — ABNORMAL HIGH (ref 70–99)
POTASSIUM: 3.3 mmol/L — AB (ref 3.5–5.1)
Sodium: 136 mmol/L (ref 135–145)

## 2018-06-11 LAB — GLUCOSE, CAPILLARY
Glucose-Capillary: 165 mg/dL — ABNORMAL HIGH (ref 70–99)
Glucose-Capillary: 204 mg/dL — ABNORMAL HIGH (ref 70–99)
Glucose-Capillary: 140 mg/dL — ABNORMAL HIGH (ref 70–99)

## 2018-06-11 MED ORDER — HYDRALAZINE HCL 20 MG/ML IJ SOLN: 10.0000 mg | INTRAMUSCULAR | Status: DC | PRN

## 2018-06-11 NOTE — Care Management Important Message (Signed)
Important Message  Patient Details  Name: Danielle [name] MRN: [id number] Date of Birth: 1932/05/08   Medicare Important Message Given:  Yes    Shelda Altes 06/11/2018, 3:12 PM

## 2018-06-11 NOTE — Care Management Note (Signed)
Case Management Note  Patient Details  Name: Danielle [name] MRN: [id number] Date of Birth: [birth year]  Subjective/Objective:   Admitted with HCAP. From home with husband. ind with ADL's. Pt going to OP PT pta. No DME needs.                  Action/Plan: Pt plans to return home with resumption of OP PT. No CM interventions needed at this time.   Expected Discharge Date:        06/12/2018          Expected Discharge Plan:  Home/Self Care  In-House Referral:  NA  Discharge planning Services  CM Consult  Post Acute Care Choice:  NA Choice offered to:  NA  Status of Service:  Completed, signed off  If discussed at Long Length of Stay Meetings, dates discussed:    Additional Comments:  Sherald Barge, RN 06/11/2018, 10:01 AM

## 2018-06-11 NOTE — Progress Notes (Signed)
RN paged Dr. Myna Hidalgo to make him aware patient's BP is 167/80, awaiting response.  P.J. Linus Mako, RN

## 2018-06-11 NOTE — Progress Notes (Signed)
Subjective: She was admitted with healthcare associated pneumonia and severe sepsis.  Her sepsis has resolved and she is getting better as far as the pneumonia is concerned.  She has been up this morning and walking in the hall a little bit so she is substantially better.  She is still short of breath with exertion.  She is still coughing up sputum  Objective: Vital signs in last 24 hours: Temp:  [97.9 F (36.6 C)-99.8 F (37.7 C)] 97.9 F (36.6 C) (01/24 0538) Pulse Rate:  [66-79] 79 (01/24 0538) Resp:  [18-19] 18 (01/24 0538) BP: (156-177)/(67-80) 170/80 (01/24 0538) SpO2:  [92 %-99 %] 96 % (01/24 0538) Weight:  [61.5 kg] 61.5 kg (01/24 0610) Weight change:  Last BM Date: 06/09/18  Intake/Output from previous day: 01/23 0701 - 01/24 0700 In: 892.4 [I.V.:892.4] Out: 1350 [Urine:1350]  PHYSICAL EXAM General appearance: alert, cooperative and mild distress Resp: rhonchi Bilaterally but right much greater than left Cardio: regular rate and rhythm, S1, S2 normal, no murmur, click, rub or gallop GI: soft, non-tender; bowel sounds normal; no masses,  no organomegaly Extremities: extremities normal, atraumatic, no cyanosis or edema  Lab Results:  Results for orders placed or performed during the hospital encounter of 06/08/18 (from the past 48 hour(s))  Glucose, capillary     Status: Abnormal   Collection Time: 06/09/18 11:17 AM  Result Value Ref Range   Glucose-Capillary 198 (H) 70 - 99 mg/dL   Comment 1 Notify RN    Comment 2 Document in Chart   Glucose, capillary     Status: Abnormal   Collection Time: 06/09/18  4:40 PM  Result Value Ref Range   Glucose-Capillary 52 (L) 70 - 99 mg/dL   Comment 1 Notify RN    Comment 2 Document in Chart    Comment 3 Repeat Test   Glucose, capillary     Status: Abnormal   Collection Time: 06/09/18  6:21 PM  Result Value Ref Range   Glucose-Capillary 186 (H) 70 - 99 mg/dL   Comment 1 Notify RN    Comment 2 Document in Chart   Glucose,  capillary     Status: Abnormal   Collection Time: 06/09/18  9:42 PM  Result Value Ref Range   Glucose-Capillary 186 (H) 70 - 99 mg/dL  Glucose, capillary     Status: Abnormal   Collection Time: 06/10/18  7:44 AM  Result Value Ref Range   Glucose-Capillary 164 (H) 70 - 99 mg/dL  Glucose, capillary     Status: Abnormal   Collection Time: 06/10/18 12:06 PM  Result Value Ref Range   Glucose-Capillary 129 (H) 70 - 99 mg/dL  Glucose, capillary     Status: None   Collection Time: 06/10/18  5:21 PM  Result Value Ref Range   Glucose-Capillary 87 70 - 99 mg/dL  Glucose, capillary     Status: Abnormal   Collection Time: 06/10/18  9:18 PM  Result Value Ref Range   Glucose-Capillary 137 (H) 70 - 99 mg/dL   Comment 1 Notify RN    Comment 2 Document in Chart   Basic metabolic panel     Status: Abnormal   Collection Time: 06/11/18  6:25 AM  Result Value Ref Range   Sodium 136 135 - 145 mmol/L   Potassium 3.3 (L) 3.5 - 5.1 mmol/L   Chloride 107 98 - 111 mmol/L   CO2 21 (L) 22 - 32 mmol/L   Glucose, Bld 159 (H) 70 - 99 mg/dL  BUN 11 8 - 23 mg/dL   Creatinine, Ser 0.75 0.44 - 1.00 mg/dL   Calcium 8.3 (L) 8.9 - 10.3 mg/dL   GFR calc non Af Amer >60 >60 mL/min   GFR calc Af Amer >60 >60 mL/min   Anion gap 8 5 - 15    Comment: Performed at [facility], [address] [place], [place] [address]  Glucose, capillary     Status: Abnormal   Collection Time: 06/11/18  7:12 AM  Result Value Ref Range   Glucose-Capillary 165 (H) 70 - 99 mg/dL    ABGS No results for input(s): PHART, PO2ART, TCO2, HCO3 in the last 72 hours.  Invalid input(s): PCO2 CULTURES Recent Results (from the past 240 hour(s))  Blood Culture (routine x 2)     Status: None (Preliminary result)   Collection Time: 06/08/18  4:35 PM  Result Value Ref Range Status   Specimen Description BLOOD RIGHT FOREARM DRAWN BY RN  Final   Special Requests   Final    BOTTLES DRAWN AEROBIC AND ANAEROBIC Blood Culture results may not be  optimal due to an inadequate volume of blood received in culture bottles   Culture   Final    NO GROWTH 3 DAYS Performed at [facility], [address] [place], [place] [address]    Report Status PENDING  Incomplete  Blood Culture (routine x 2)     Status: None (Preliminary result)   Collection Time: 06/08/18  4:58 PM  Result Value Ref Range Status   Specimen Description BLOOD RIGHT HAND  Final   Special Requests   Final    BOTTLES DRAWN AEROBIC ONLY Blood Culture results may not be optimal due to an inadequate volume of blood received in culture bottles   Culture   Final    NO GROWTH 3 DAYS Performed at [facility], [address] [place], [place] [address]    Report Status PENDING  Incomplete  MRSA PCR Screening     Status: None   Collection Time: 06/08/18 10:57 PM  Result Value Ref Range Status   MRSA by PCR NEGATIVE NEGATIVE Final    Comment:        The GeneXpert MRSA Assay (FDA approved for NASAL specimens only), is one component of a comprehensive MRSA colonization surveillance program. It is not intended to diagnose MRSA infection nor to guide or monitor treatment for MRSA infections. Performed at [facility], [address], [place],  [address]    Studies/Results: No results found.  Medications:  Prior to Admission:  Medications Prior to Admission  Medication Sig Dispense Refill Last Dose  . amoxicillin (AMOXIL) 500 MG capsule Take 500 mg by mouth 3 (three) times daily.   06/08/2018 at Unknown time  . aspirin EC 81 MG tablet Take 81 mg by mouth daily.   06/07/2018 at Unknown time  . atorvastatin (LIPITOR) 40 MG tablet Take 1 tablet (40 mg total) by mouth daily at 6 PM. 30 tablet 0 06/07/2018 at Unknown time  . Calcium Carb-Cholecalciferol (CALCIUM 600 + D PO) Take 1 tablet by mouth daily.   Past Week at Unknown time  . clopidogrel (PLAVIX) 75 MG tablet Take 1 tablet (75 mg total) by mouth daily with breakfast. 30 tablet 0 06/07/2018 at Unknown time  .  Insulin Glargine (TOUJEO MAX SOLOSTAR) 300 UNIT/ML SOPN Inject 22 Units into the skin daily. (Patient taking differently: Inject 18-22 Units into the skin daily. PER SLIDING SCALE) 3 mL 0 06/08/2018 at Unknown time  .  insulin lispro (HUMALOG) 100 UNIT/ML injection Inject 8-10 Units into the skin 2 (two) times daily.    06/07/2018 at Unknown time  . levothyroxine (SYNTHROID, LEVOTHROID) 88 MCG tablet Take 88 mcg by mouth daily before breakfast. BRAND ONLY   06/07/2018 at Unknown time  . Multiple Vitamin (MULTIVITAMIN WITH MINERALS) TABS Take 1 tablet by mouth daily.    Past Week at Unknown time  . multivitamin-lutein (OCUVITE-LUTEIN) CAPS capsule Take 1 capsule by mouth daily.   06/07/2018 at Unknown time  . Omega-3 Fatty Acids (FISH OIL) 1000 MG CAPS Take 1,000 mg by mouth daily.   Past Week at Unknown time  . vitamin B-12 (CYANOCOBALAMIN) 1000 MCG tablet Take 1,000 mcg by mouth daily.   Past Week at Unknown time   Scheduled: . aspirin EC  81 mg Oral Daily  . atorvastatin  40 mg Oral q1800  . chlorhexidine  15 mL Mouth Rinse BID  . clopidogrel  75 mg Oral Q breakfast  . guaiFENesin  1,200 mg Oral BID  . insulin aspart  0-5 Units Subcutaneous QHS  . insulin aspart  0-9 Units Subcutaneous TID WC  . insulin detemir  12 Units Subcutaneous Q breakfast  . levothyroxine  88 mcg Oral Q0600  . mouth rinse  15 mL Mouth Rinse q12n4p   Continuous: . azithromycin 500 mg (06/10/18 1918)  . ceFEPime (MAXIPIME) IV 2 g (06/10/18 1750)   XBW:[id number] **OR** acetaminophen, hydrALAZINE, ondansetron **OR** ondansetron (ZOFRAN) IV  Assesment: She was admitted with right middle lobe pneumonia which is healthcare associated.  She had severe sepsis on admission and she is better with that.  Her sepsis is resolved.  She has diabetes and had low blood sugar earlier so her sliding scale has been adjusted to sensitive scale  She has generalized weakness had a recent stroke and it is recommended that she continue  with outpatient PT Principal Problem:   HCAP (healthcare-associated pneumonia) Active Problems:   Type 2 diabetes mellitus with hypoglycemia without coma ([place])   Hypothyroidism (acquired)   Right middle lobe pneumonia (HCC)   Severe sepsis (HCC)   Generalized weakness    Plan: Discontinue IV fluids.  Continue current treatments.  Potential discharge in the next 48 hours.    LOS: 3 days   Alonza Bogus 06/11/2018, 8:34 AM

## 2018-06-12 LAB — GLUCOSE, CAPILLARY
Glucose-Capillary: 68 mg/dL — ABNORMAL LOW (ref 70–99)
Glucose-Capillary: 136 mg/dL — ABNORMAL HIGH (ref 70–99)
Glucose-Capillary: 249 mg/dL — ABNORMAL HIGH (ref 70–99)
Glucose-Capillary: 178 mg/dL — ABNORMAL HIGH (ref 70–99)
Glucose-Capillary: 129 mg/dL — ABNORMAL HIGH (ref 70–99)

## 2018-06-12 NOTE — Progress Notes (Signed)
Subjective: She says she feels better.  She still short of breath with exertion.  She is still coughing up sputum.  She is still weak  Objective: Vital signs in last 24 hours: Temp:  [98.5 F (36.9 C)-98.6 F (37 C)] 98.6 F (37 C) (01/25 8182) Pulse Rate:  [62-74] 74 (01/25 0632) Resp:  [18-19] 18 (01/24 2123) BP: (118-162)/(57-91) 162/65 (01/25 9937) SpO2:  [93 %-99 %] 99 % (01/25 1696) Weight:  [61.2 kg] 61.2 kg (01/25 7893) Weight change: -0.3 kg Last BM Date: 06/11/18  Intake/Output from previous day: 01/24 0701 - 01/25 0700 In: -  Out: 700 [Urine:700]  PHYSICAL EXAM General appearance: alert, cooperative and no distress Resp: She still has rhonchi on the right but she is significantly clearer Cardio: regular rate and rhythm, S1, S2 normal, no murmur, click, rub or gallop GI: soft, non-tender; bowel sounds normal; no masses,  no organomegaly Extremities: extremities normal, atraumatic, no cyanosis or edema  Lab Results:  Results for orders placed or performed during the hospital encounter of 06/08/18 (from the past 48 hour(s))  Glucose, capillary     Status: Abnormal   Collection Time: 06/10/18 12:06 PM  Result Value Ref Range   Glucose-Capillary 129 (H) 70 - 99 mg/dL  Glucose, capillary     Status: None   Collection Time: 06/10/18  5:21 PM  Result Value Ref Range   Glucose-Capillary 87 70 - 99 mg/dL  Glucose, capillary     Status: Abnormal   Collection Time: 06/10/18  9:18 PM  Result Value Ref Range   Glucose-Capillary 137 (H) 70 - 99 mg/dL   Comment 1 Notify RN    Comment 2 Document in Chart   Basic metabolic panel     Status: Abnormal   Collection Time: 06/11/18  6:25 AM  Result Value Ref Range   Sodium 136 135 - 145 mmol/L   Potassium 3.3 (L) 3.5 - 5.1 mmol/L   Chloride 107 98 - 111 mmol/L   CO2 21 (L) 22 - 32 mmol/L   Glucose, Bld 159 (H) 70 - 99 mg/dL   BUN 11 8 - 23 mg/dL   Creatinine, Ser 0.75 0.44 - 1.00 mg/dL   Calcium 8.3 (L) 8.9 - 10.3 mg/dL   GFR calc non Af Amer >60 >60 mL/min   GFR calc Af Amer >60 >60 mL/min   Anion gap 8 5 - 15    Comment: Performed at [facility], [address], [place], [place] [address]  Glucose, capillary     Status: Abnormal   Collection Time: 06/11/18  7:12 AM  Result Value Ref Range   Glucose-Capillary 165 (H) 70 - 99 mg/dL  Glucose, capillary     Status: Abnormal   Collection Time: 06/11/18  4:36 PM  Result Value Ref Range   Glucose-Capillary 204 (H) 70 - 99 mg/dL  Glucose, capillary     Status: Abnormal   Collection Time: 06/11/18  9:19 PM  Result Value Ref Range   Glucose-Capillary 140 (H) 70 - 99 mg/dL   Comment 1 Notify RN    Comment 2 Document in Chart   Glucose, capillary     Status: Abnormal   Collection Time: 06/12/18  7:26 AM  Result Value Ref Range   Glucose-Capillary 68 (L) 70 - 99 mg/dL   Comment 1 Notify RN    Comment 2 Document in Chart   Glucose, capillary     Status: Abnormal   Collection Time: 06/12/18  9:27 AM  Result Value  Ref Range   Glucose-Capillary 136 (H) 70 - 99 mg/dL   Comment 1 Notify RN    Comment 2 Document in Chart     ABGS No results for input(s): PHART, PO2ART, TCO2, HCO3 in the last 72 hours.  Invalid input(s): PCO2 CULTURES Recent Results (from the past 240 hour(s))  Blood Culture (routine x 2)     Status: None (Preliminary result)   Collection Time: 06/08/18  4:35 PM  Result Value Ref Range Status   Specimen Description BLOOD RIGHT FOREARM DRAWN BY RN  Final   Special Requests   Final    BOTTLES DRAWN AEROBIC AND ANAEROBIC Blood Culture results may not be optimal due to an inadequate volume of blood received in culture bottles   Culture   Final    NO GROWTH 3 DAYS Performed at [facility], [address], [place], [place] [address]    Report Status PENDING  Incomplete  Blood Culture (routine x 2)     Status: None (Preliminary result)   Collection Time: 06/08/18  4:58 PM  Result Value Ref Range Status   Specimen Description BLOOD RIGHT HAND   Final   Special Requests   Final    BOTTLES DRAWN AEROBIC ONLY Blood Culture results may not be optimal due to an inadequate volume of blood received in culture bottles   Culture   Final    NO GROWTH 3 DAYS Performed at [facility], [address] [place], [place] [address]    Report Status PENDING  Incomplete  MRSA PCR Screening     Status: None   Collection Time: 06/08/18 10:57 PM  Result Value Ref Range Status   MRSA by PCR NEGATIVE NEGATIVE Final    Comment:        The GeneXpert MRSA Assay (FDA approved for NASAL specimens only), is one component of a comprehensive MRSA colonization surveillance program. It is not intended to diagnose MRSA infection nor to guide or monitor treatment for MRSA infections. Performed at [facility], [address], [place], [place] [address]    Studies/Results: No results found.  Medications:  Prior to Admission:  Medications Prior to Admission  Medication Sig Dispense Refill Last Dose  . amoxicillin (AMOXIL) 500 MG capsule Take 500 mg by mouth 3 (three) times daily.   06/08/2018 at Unknown time  . aspirin EC 81 MG tablet Take 81 mg by mouth daily.   06/07/2018 at Unknown time  . atorvastatin (LIPITOR) 40 MG tablet Take 1 tablet (40 mg total) by mouth daily at 6 PM. 30 tablet 0 06/07/2018 at Unknown time  . Calcium Carb-Cholecalciferol (CALCIUM 600 + D PO) Take 1 tablet by mouth daily.   Past Week at Unknown time  . clopidogrel (PLAVIX) 75 MG tablet Take 1 tablet (75 mg total) by mouth daily with breakfast. 30 tablet 0 06/07/2018 at Unknown time  . Insulin Glargine (TOUJEO MAX SOLOSTAR) 300 UNIT/ML SOPN Inject 22 Units into the skin daily. (Patient taking differently: Inject 18-22 Units into the skin daily. PER SLIDING SCALE) 3 mL 0 06/08/2018 at Unknown time  . insulin lispro (HUMALOG) 100 UNIT/ML injection Inject 8-10 Units into the skin 2 (two) times daily.    06/07/2018 at Unknown time  . levothyroxine (SYNTHROID, LEVOTHROID) 88 MCG tablet Take  88 mcg by mouth daily before breakfast. BRAND ONLY   06/07/2018 at Unknown time  . Multiple Vitamin (MULTIVITAMIN WITH MINERALS) TABS Take 1 tablet by mouth daily.    Past Week at Unknown time  .  multivitamin-lutein (OCUVITE-LUTEIN) CAPS capsule Take 1 capsule by mouth daily.   06/07/2018 at Unknown time  . Omega-3 Fatty Acids (FISH OIL) 1000 MG CAPS Take 1,000 mg by mouth daily.   Past Week at Unknown time  . vitamin B-12 (CYANOCOBALAMIN) 1000 MCG tablet Take 1,000 mcg by mouth daily.   Past Week at Unknown time   Scheduled: . aspirin EC  81 mg Oral Daily  . atorvastatin  40 mg Oral q1800  . chlorhexidine  15 mL Mouth Rinse BID  . clopidogrel  75 mg Oral Q breakfast  . guaiFENesin  1,200 mg Oral BID  . insulin aspart  0-5 Units Subcutaneous QHS  . insulin aspart  0-9 Units Subcutaneous TID WC  . insulin detemir  12 Units Subcutaneous Q breakfast  . levothyroxine  88 mcg Oral Q0600  . mouth rinse  15 mL Mouth Rinse q12n4p   Continuous: . azithromycin 500 mg (06/11/18 1844)  . ceFEPime (MAXIPIME) IV 2 g (06/11/18 1716)   PIR:[id number] **OR** acetaminophen, hydrALAZINE, ondansetron **OR** ondansetron (ZOFRAN) IV  Assesment: She was admitted with healthcare associated pneumonia and associated severe sepsis.  Her pneumonia is in the right middle lobe.  She is improving.  Her sepsis pathophysiology has resolved  She has generalized weakness which is improving but she still has difficulty getting around.  She is still short of breath with exertion  Her blood sugar has been pretty well controlled but it was slightly low this morning Principal Problem:   HCAP (healthcare-associated pneumonia) Active Problems:   Type 2 diabetes mellitus with hypoglycemia without coma (HCC)   Hypothyroidism (acquired)   Right middle lobe pneumonia (HCC)   Severe sepsis (HCC)   Generalized weakness    Plan: Continue current treatments.  Probable discharge in the morning    LOS: 4 days   Alonza Bogus 06/12/2018, 9:39 AM

## 2018-06-13 LAB — CULTURE, BLOOD (ROUTINE X 2)
Culture: NO GROWTH
Culture: NO GROWTH

## 2018-06-13 LAB — GLUCOSE, CAPILLARY
Glucose-Capillary: 98 mg/dL (ref 70–99)
Glucose-Capillary: 199 mg/dL — ABNORMAL HIGH (ref 70–99)
Glucose-Capillary: 186 mg/dL — ABNORMAL HIGH (ref 70–99)

## 2018-06-13 MED ORDER — LEVOFLOXACIN 500 MG PO TABS: 500.0000 mg | ORAL_TABLET | Freq: Every day | ORAL | 0 refills | Status: DC

## 2018-06-13 MED ORDER — GUAIFENESIN ER 600 MG PO TB12: 1200.0000 mg | ORAL_TABLET | Freq: Two times a day (BID) | ORAL | 1 refills | Status: DC

## 2018-06-13 MED ORDER — LEVOFLOXACIN 750 MG PO TABS
750.0000 mg | ORAL_TABLET | ORAL | Status: DC
  Administered 2018-06-13: 750 mg via ORAL
  Filled 2018-06-13: qty 1

## 2018-06-13 NOTE — Discharge Summary (Signed)
Physician Discharge Summary  Patient ID: Danielle [name] MRN: [id number] DOB/AGE: 83/29/1933 83 y.o. Primary Care Physician:Pollyann Roa, Percell Miller, MD Admit date: 06/08/2018 Discharge date: 06/13/2018    Discharge Diagnoses:   Principal Problem:   HCAP (healthcare-associated pneumonia) Active Problems:   Type 2 diabetes mellitus with hypoglycemia without coma ([place])   Hypothyroidism (acquired)   Right middle lobe pneumonia ([facility])   Severe sepsis (HCC)   Generalized weakness   Allergies as of 06/13/2018      Reactions   Fosamax [alendronate Sodium] Other (See Comments)   MUSCLE ACHES   Prednisone    Feels jittery and nauseous      Medication List    STOP taking these medications   amoxicillin 500 MG capsule Commonly known as:  AMOXIL     TAKE these medications   aspirin EC 81 MG tablet Take 81 mg by mouth daily.   atorvastatin 40 MG tablet Commonly known as:  LIPITOR Take 1 tablet (40 mg total) by mouth daily at 6 PM.   CALCIUM 600 + D PO Take 1 tablet by mouth daily.   clopidogrel 75 MG tablet Commonly known as:  PLAVIX Take 1 tablet (75 mg total) by mouth daily with breakfast.   Fish Oil 1000 MG Caps Take 1,000 mg by mouth daily.   guaiFENesin 600 MG 12 hr tablet Commonly known as:  MUCINEX Take 2 tablets (1,200 mg total) by mouth 2 (two) times daily.   Insulin Glargine 300 UNIT/ML Sopn Commonly known as:  TOUJEO MAX SOLOSTAR Inject 22 Units into the skin daily. What changed:    how much to take  additional instructions   insulin lispro 100 UNIT/ML injection Commonly known as:  HUMALOG Inject 8-10 Units into the skin 2 (two) times daily.   levofloxacin 500 MG tablet Commonly known as:  LEVAQUIN Take 1 tablet (500 mg total) by mouth daily.   levothyroxine 88 MCG tablet Commonly known as:  SYNTHROID, LEVOTHROID Take 88 mcg by mouth daily before breakfast. BRAND ONLY   multivitamin with minerals Tabs tablet Take 1 tablet by mouth daily.    multivitamin-lutein Caps capsule Take 1 capsule by mouth daily.   vitamin B-12 1000 MCG tablet Commonly known as:  CYANOCOBALAMIN Take 1,000 mcg by mouth daily.       Discharged Condition: Improved    Consults: None  Significant Diagnostic Studies: Dg Chest 2 View  Result Date: 06/08/2018 CLINICAL DATA:  Dry cough for 1 week.  Fever and weakness. EXAM: CHEST - 2 VIEW COMPARISON:  Chest x-ray 02/06/2017 FINDINGS: The cardiac silhouette, mediastinal and hilar contours are within normal limits and stable. There is moderate tortuosity and calcification of the thoracic aorta. Increased density noted in the right middle lobe, partially obscuring the right heart border and more evident on the lateral film. Findings consistent with a right middle lobe infiltrate/pneumonia. The bony thorax is intact. 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 IMPRESSION: Right middle lobe pneumonia. Followup PA and lateral chest X-ray is recommended in 3-4 weeks following trial of antibiotic therapy to ensure resolution and exclude underlying malignancy. Electronically Signed   By: Marijo Sanes M.D.   On: 06/08/2018 13:24    Lab Results: Basic Metabolic Panel: Recent Labs    06/11/18 0625  NA 136  K 3.3*  CL 107  CO2 21*  GLUCOSE 159*  BUN 11  CREATININE 0.75  CALCIUM 8.3*   Liver Function Tests: No results for input(s): AST, ALT, ALKPHOS, BILITOT,  PROT, ALBUMIN in the last 72 hours.   CBC: No results for input(s): WBC, NEUTROABS, HGB, HCT, MCV, PLT in the last 72 hours.  Recent Results (from the past 240 hour(s))  Blood Culture (routine x 2)     Status: None   Collection Time: 06/08/18  4:35 PM  Result Value Ref Range Status   Specimen Description BLOOD RIGHT FOREARM DRAWN BY RN  Final   Special Requests   Final    BOTTLES DRAWN AEROBIC AND ANAEROBIC Blood Culture results may not be optimal due to an inadequate volume of blood  received in culture bottles   Culture   Final    NO GROWTH 5 DAYS Performed at [facility], [address] [place], [place] [address]    Report Status 06/13/2018 FINAL  Final  Blood Culture (routine x 2)     Status: None   Collection Time: 06/08/18  4:58 PM  Result Value Ref Range Status   Specimen Description BLOOD RIGHT HAND  Final   Special Requests   Final    BOTTLES DRAWN AEROBIC ONLY Blood Culture results may not be optimal due to an inadequate volume of blood received in culture bottles   Culture   Final    NO GROWTH 5 DAYS Performed at [facility], [address], [place], [place] [address]    Report Status 06/13/2018 FINAL  Final  MRSA PCR Screening     Status: None   Collection Time: 06/08/18 10:57 PM  Result Value Ref Range Status   MRSA by PCR NEGATIVE NEGATIVE Final    Comment:        The GeneXpert MRSA Assay (FDA approved for NASAL specimens only), is one component of a comprehensive MRSA colonization surveillance program. It is not intended to diagnose MRSA infection nor to guide or monitor treatment for MRSA infections. Performed at [facility], [address], [place], [place] [address]      Hospital Course: This is an 85 year old who came to the emergency department with increasing shortness of breath cough and congestion.  She had been febrile.  She was found to have right middle lobe pneumonia on chest x-ray.  She had been in the hospital with a stroke so she was treated for healthcare associated pneumonia.  She improved fairly rapidly resolved her sepsis pathophysiology and her lungs cleared up nicely.  By the time of discharge she was back at baseline with her breathing but still coughing up sputum.  Discharge Exam: Blood pressure (!) 155/57, pulse 64, temperature 98.2 F (36.8 C), temperature source Oral, resp. rate 18, height 5\' 2"  (1.575 m), weight 60.6 kg, SpO2 96 %. She is awake and alert.  Chest is clear.  Heart is regular.  Disposition:  Home      Signed: Alonza Bogus   06/13/2018, 10:42 AM

## 2018-06-13 NOTE — Progress Notes (Signed)
Pharmacy Antibiotic Note  Danielle [name] is a 85 y.o. female admitted on 06/08/2018 with pneumonia.  Pharmacy has been consulted for cefepime dosing. WBC 12.9 > 9.9, afebrile   Plan: Continuecefepime 2gm IV q24h F/U cxs and clinical progress Monitor V/S, labs   Height: 5\' 2"  (157.5 cm) Weight: 133 lb 9.6 oz (60.6 kg) IBW/kg (Calculated) : 50.1  Temp (24hrs), Avg:98.3 F (36.8 C), Min:98.2 F (36.8 C), Max:98.5 F (36.9 C)  Recent Labs  Lab 06/08/18 1320 06/08/18 1657 06/08/18 1841 06/09/18 0035 06/09/18 0543 06/11/18 0625  WBC 12.9*  --   --   --  9.9  --   CREATININE 0.90  --   --   --  0.81 0.75  LATICACIDVEN  --  1.9 2.1* 1.1  --   --     Estimated Creatinine Clearance: 44.9 mL/min (by C-G formula based on SCr of 0.75 mg/dL).    Allergies  Allergen Reactions  . Fosamax [Alendronate Sodium] Other (See Comments)    MUSCLE ACHES  . Prednisone     Feels jittery and nauseous    Antimicrobials this admission: 1/21 cefepime >>   1/21 azithromycin >>  1/21 ceftriaxone x 1   Dose adjustments: 1/22 Change cefepime 2gm IV q24h  Microbiology results: 1/21 BCx: ngtd 1/21 MRSA PCR: negative  Thank you for allowing pharmacy to be a part of this patient's care.  Thomasenia Sales, PharmD, MBA, BCGP Clinical Pharmacist  06/13/2018 10:32 AM

## 2018-06-13 NOTE — Progress Notes (Signed)
Subjective: She feels better.  No further fever.  She is still coughing up sputum.  Objective: Vital signs in last 24 hours: Temp:  [98.2 F (36.8 C)-98.5 F (36.9 C)] 98.2 F (36.8 C) (01/26 0500) Pulse Rate:  [60-71] 64 (01/26 0500) Resp:  [18-20] 18 (01/26 0500) BP: (100-155)/(54-66) 155/57 (01/26 0500) SpO2:  [95 %-98 %] 96 % (01/26 0500) Weight:  [60.6 kg] 60.6 kg (01/26 0316) Weight change: -0.6 kg Last BM Date: 06/11/18  Intake/Output from previous day: 01/25 0701 - 01/26 0700 In: 1050.1 [IV Piggyback:1050.1] Out: 300 [Urine:300]  PHYSICAL EXAM General appearance: alert, cooperative and no distress Resp: clear to auscultation bilaterally Cardio: regular rate and rhythm, S1, S2 normal, no murmur, click, rub or gallop GI: soft, non-tender; bowel sounds normal; no masses,  no organomegaly Extremities: extremities normal, atraumatic, no cyanosis or edema  Lab Results:  Results for orders placed or performed during the hospital encounter of 06/08/18 (from the past 48 hour(s))  Glucose, capillary     Status: Abnormal   Collection Time: 06/11/18  4:36 PM  Result Value Ref Range   Glucose-Capillary 204 (H) 70 - 99 mg/dL  Glucose, capillary     Status: Abnormal   Collection Time: 06/11/18  9:19 PM  Result Value Ref Range   Glucose-Capillary 140 (H) 70 - 99 mg/dL   Comment 1 Notify RN    Comment 2 Document in Chart   Glucose, capillary     Status: Abnormal   Collection Time: 06/12/18  7:26 AM  Result Value Ref Range   Glucose-Capillary 68 (L) 70 - 99 mg/dL   Comment 1 Notify RN    Comment 2 Document in Chart   Glucose, capillary     Status: Abnormal   Collection Time: 06/12/18  9:27 AM  Result Value Ref Range   Glucose-Capillary 136 (H) 70 - 99 mg/dL   Comment 1 Notify RN    Comment 2 Document in Chart   Glucose, capillary     Status: Abnormal   Collection Time: 06/12/18 11:07 AM  Result Value Ref Range   Glucose-Capillary 249 (H) 70 - 99 mg/dL   Comment 1  Notify RN    Comment 2 Document in Chart   Glucose, capillary     Status: Abnormal   Collection Time: 06/12/18  4:35 PM  Result Value Ref Range   Glucose-Capillary 178 (H) 70 - 99 mg/dL  Glucose, capillary     Status: Abnormal   Collection Time: 06/12/18  9:37 PM  Result Value Ref Range   Glucose-Capillary 129 (H) 70 - 99 mg/dL   Comment 1 Notify RN    Comment 2 Document in Chart   Glucose, capillary     Status: None   Collection Time: 06/13/18  7:32 AM  Result Value Ref Range   Glucose-Capillary 98 70 - 99 mg/dL   Comment 1 Notify RN    Comment 2 Document in Chart   Glucose, capillary     Status: Abnormal   Collection Time: 06/13/18  9:26 AM  Result Value Ref Range   Glucose-Capillary 199 (H) 70 - 99 mg/dL   Comment 1 Notify RN    Comment 2 Document in Chart     ABGS No results for input(s): PHART, PO2ART, TCO2, HCO3 in the last 72 hours.  Invalid input(s): PCO2 CULTURES Recent Results (from the past 240 hour(s))  Blood Culture (routine x 2)     Status: None   Collection Time: 06/08/18  4:35 PM  Result Value Ref Range Status   Specimen Description BLOOD RIGHT FOREARM DRAWN BY RN  Final   Special Requests   Final    BOTTLES DRAWN AEROBIC AND ANAEROBIC Blood Culture results may not be optimal due to an inadequate volume of blood received in culture bottles   Culture   Final    NO GROWTH 5 DAYS Performed at [facility], [address] [place], [place] [address]    Report Status 06/13/2018 FINAL  Final  Blood Culture (routine x 2)     Status: None   Collection Time: 06/08/18  4:58 PM  Result Value Ref Range Status   Specimen Description BLOOD RIGHT HAND  Final   Special Requests   Final    BOTTLES DRAWN AEROBIC ONLY Blood Culture results may not be optimal due to an inadequate volume of blood received in culture bottles   Culture   Final    NO GROWTH 5 DAYS Performed at [facility], [address], [place], [place] [address]    Report Status 06/13/2018 FINAL  Final   MRSA PCR Screening     Status: None   Collection Time: 06/08/18 10:57 PM  Result Value Ref Range Status   MRSA by PCR NEGATIVE NEGATIVE Final    Comment:        The GeneXpert MRSA Assay (FDA approved for NASAL specimens only), is one component of a comprehensive MRSA colonization surveillance program. It is not intended to diagnose MRSA infection nor to guide or monitor treatment for MRSA infections. Performed at [facility], [address] [place], [place] [address]    Studies/Results: No results found.  Medications:  Prior to Admission:  Medications Prior to Admission  Medication Sig Dispense Refill Last Dose  . amoxicillin (AMOXIL) 500 MG capsule Take 500 mg by mouth 3 (three) times daily.   06/08/2018 at Unknown time  . aspirin EC 81 MG tablet Take 81 mg by mouth daily.   06/07/2018 at Unknown time  . atorvastatin (LIPITOR) 40 MG tablet Take 1 tablet (40 mg total) by mouth daily at 6 PM. 30 tablet 0 06/07/2018 at Unknown time  . Calcium Carb-Cholecalciferol (CALCIUM 600 + D PO) Take 1 tablet by mouth daily.   Past Week at Unknown time  . clopidogrel (PLAVIX) 75 MG tablet Take 1 tablet (75 mg total) by mouth daily with breakfast. 30 tablet 0 06/07/2018 at Unknown time  . Insulin Glargine (TOUJEO MAX SOLOSTAR) 300 UNIT/ML SOPN Inject 22 Units into the skin daily. (Patient taking differently: Inject 18-22 Units into the skin daily. PER SLIDING SCALE) 3 mL 0 06/08/2018 at Unknown time  . insulin lispro (HUMALOG) 100 UNIT/ML injection Inject 8-10 Units into the skin 2 (two) times daily.    06/07/2018 at Unknown time  . levothyroxine (SYNTHROID, LEVOTHROID) 88 MCG tablet Take 88 mcg by mouth daily before breakfast. BRAND ONLY   06/07/2018 at Unknown time  . Multiple Vitamin (MULTIVITAMIN WITH MINERALS) TABS Take 1 tablet by mouth daily.    Past Week at Unknown time  . multivitamin-lutein (OCUVITE-LUTEIN) CAPS capsule Take 1 capsule by mouth daily.   06/07/2018 at Unknown time  . Omega-3  Fatty Acids (FISH OIL) 1000 MG CAPS Take 1,000 mg by mouth daily.   Past Week at Unknown time  . vitamin B-12 (CYANOCOBALAMIN) 1000 MCG tablet Take 1,000 mcg by mouth daily.   Past Week at Unknown time   Scheduled: . aspirin EC  81 mg Oral Daily  . atorvastatin  40  mg Oral q1800  . chlorhexidine  15 mL Mouth Rinse BID  . clopidogrel  75 mg Oral Q breakfast  . guaiFENesin  1,200 mg Oral BID  . insulin aspart  0-5 Units Subcutaneous QHS  . insulin aspart  0-9 Units Subcutaneous TID WC  . insulin detemir  12 Units Subcutaneous Q breakfast  . levofloxacin  500 mg Oral Daily  . levothyroxine  88 mcg Oral Q0600  . mouth rinse  15 mL Mouth Rinse q12n4p   Continuous:  MMH:[id number] **OR** acetaminophen, hydrALAZINE, ondansetron **OR** ondansetron (ZOFRAN) IV  Assesment: She was admitted with healthcare associated pneumonia in the right middle lobe.  She had severe sepsis on admission.  Her sepsis physiology has resolved.  Is much better as far as the pneumonia is concerned. Principal Problem:   HCAP (healthcare-associated pneumonia) Active Problems:   Type 2 diabetes mellitus with hypoglycemia without coma ([place])   Hypothyroidism (acquired)   Right middle lobe pneumonia (HCC)   Severe sepsis (HCC)   Generalized weakness    Plan: Discharge home today    LOS: 5 days   Alonza Bogus 06/13/2018, 10:41 AM

## 2018-06-13 NOTE — Progress Notes (Signed)
Dc instructions given to husband [name]) and pt w/ med changes and followup appointment. IV d/c'd, pt tolerated well. All questions answered. No distress noted. Transported to car via [place].

## 2018-06-14 LAB — GLUCOSE, CAPILLARY: Glucose-Capillary: 64 mg/dL — ABNORMAL LOW (ref 70–99)

## 2018-06-15 ENCOUNTER — Encounter (HOSPITAL_COMMUNITY): Payer: Self-pay

## 2018-06-15 ENCOUNTER — Ambulatory Visit (HOSPITAL_COMMUNITY): Payer: Medicare Other

## 2018-06-15 DIAGNOSIS — R2681 Unsteadiness on feet: Secondary | ICD-10-CM

## 2018-06-15 DIAGNOSIS — M6281 Muscle weakness (generalized): Secondary | ICD-10-CM

## 2018-06-15 NOTE — Therapy (Signed)
[facility] [address] [place], [place], [address] Phone: [phone]   Fax:  [phone]   PHYSICAL THERAPY DISCHARGE SUMMARY  Visits from Start of Care: 4  Current functional level related to goals / functional outcomes: See below   Remaining deficits: See below   Education / Equipment: HEP  Plan: Patient agrees to discharge.  Patient goals were partially met. Patient is being discharged due to the patient's request.  ?????     Physical Therapy Treatment  Patient Details  Name: Danielle [name] MRN: [id number] Date of Birth: Sep 26, 1931 Referring Provider (PT): Patrecia Pour, MD   Encounter Date: 06/15/2018  PT End of Session - 06/15/18 1430    Visit Number  4    Number of Visits  5    Date for PT Re-Evaluation  06/07/18    Authorization Type  [employer] Medicare    Authorization Time Period  05/10/18 to 06/07/18    Authorization - Visit Number  4    Authorization - Number of Visits  10    PT Start Time  1430    PT Stop Time  1505    PT Time Calculation (min)  35 min    Equipment Utilized During Treatment  Gait belt    Activity Tolerance  Patient tolerated treatment well    Behavior During Therapy  WFL for tasks assessed/performed       Past Medical History:  Diagnosis Date  . Arthritis    "some joint pain once in awhile" (05/26/2017)  . History of kidney stones   . Hyperlipemia   . Hypertension   . Hypothyroidism (acquired)   . Pneumothorax 02/05/2017   right-after fall  . Rib fractures 02/05/2017   right side  . Type II diabetes mellitus ([place])     Past Surgical History:  Procedure Laterality Date  . AUGMENTATION MAMMAPLASTY Bilateral   . DILATION AND CURETTAGE OF UTERUS    . FRACTURE SURGERY    . REVERSE SHOULDER ARTHROPLASTY Right 05/26/2017  . REVERSE SHOULDER ARTHROPLASTY Right 05/26/2017   Procedure: REVERSE RIGHT SHOULDER ARTHROPLASTY;  Surgeon: Meredith Pel, MD;  Location: [facility];  Service:  Orthopedics;  Laterality: Right;  . TONSILLECTOMY    . TUBAL LIGATION      There were no vitals filed for this visit.  Subjective Assessment - 06/15/18 1434    Subjective  Pt states that she was recently admitted and d/c from the hospital due to pneumonia. She feels okay now, just tired and sleepy.     Currently in Pain?  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 Bonner Puna yet    Prior Therapy  OT for shoulder earlier this year      Strength   Right Hip Extension  3+/5   was 3+   Right Hip ABduction  4+/5   was 4   Left Hip Extension  3+/5   was 3+   Left Hip ABduction  4/5   was 4     Balance   Balance Assessed  Yes      Static Standing Balance   Static Standing -  Balance Support  No upper extremity supported    Static Standing Balance -  Activities   Single Leg Stance - Right Leg;Single Leg Stance - Left Leg;Tandam Stance - Right Leg;Tandam Stance - Left Leg    Static Standing - Comment/# of Minutes  SLS- R: 5sec L: 10sec; Tandem stance - R back: 10sec or < LLE back: 7sec or <      Functional Gait  Assessment   Gait assessed   Yes    Gait Level Surface  Walks 20 ft in less than 7 sec but greater than 5.5 sec, uses assistive device, slower speed, mild gait deviations, or deviates 6-10 in outside of the 12 in walkway width.    Change in Gait Speed  Able to change speed, demonstrates mild gait deviations, deviates 6-10 in outside of the 12 in walkway width, or no gait deviations, unable to achieve a major change in velocity, or uses a change in velocity, or uses an assistive device.    Gait with Horizontal Head Turns  Performs head turns smoothly with slight change in gait velocity (eg, minor disruption to smooth gait path), deviates  6-10 in outside 12 in walkway width, or uses an assistive device.    Gait with Vertical Head Turns  Performs task with slight change in gait velocity (eg, minor disruption to smooth gait path), deviates 6 - 10 in outside 12 in walkway width or uses assistive device    Gait and Pivot Turn  Pivot turns safely within 3 sec and stops quickly with no loss of balance.    Step Over Obstacle  Is able to step over one shoe box (4.5 in total height) without changing gait speed. No evidence of imbalance.    Gait with Narrow Base of Support  Ambulates less than 4 steps heel to toe or cannot perform without assistance.    Gait with Eyes Closed  Walks 20 ft, uses assistive device, slower speed, mild gait deviations, deviates 6-10 in outside 12 in walkway width. Ambulates 20 ft in less than 9 sec but greater than 7 sec.    Ambulating Backwards  Walks 20 ft, uses assistive device, slower speed, mild gait deviations, deviates 6-10 in outside 12 in walkway width.    Steps  Two feet to a stair, must use rail.    Total Score  18         PT Education - 06/15/18 1430    Education Details  reassessment findings    Person(s) Educated  Patient    Methods  Explanation;Demonstration    Comprehension  Returned demonstration;Verbalized understanding         PT Short Term Goals - 06/15/18 1434      PT SHORT TERM GOAL #1   Title  Pt will be independent with HEP and perform consistently in order to improve strength and balance.     Time  2    Period  Weeks    Status  Partially Met      PT SHORT TERM GOAL #2   Title  Pt will be able to perform bil SLS for 10 sec or > without UE support to demo improved balance and maximize her gait to reduce risk for falls.     Baseline  1/28: L: 10sec, R: 5 sec    Time  2    Period  Weeks    Status  Partially Met        PT Long Term Goals - 06/15/18 1435  PT LONG TERM GOAL #1   Title  Pt will have improved bilateral proximal hip extension MMT to 4/5 and hip abd MMT  to 4+/5 in order to maximize her balance and gait.     Baseline  1/28: see MMT    Time  4    Period  Weeks    Status  On-going      PT LONG TERM GOAL #2   Title  Pt will be able to perform bil tandem stance for 10 sec or > without UE support in order to demo improved functional strength and balance.    Baseline  1/28: R back 10sec, L back 7 sec    Time  4    Period  Weeks    Status  Partially Met      PT LONG TERM GOAL #3   Title  Pt will score 25/30 on the FGA or better to demo improved dynamic balance and reduce her risk for falls during ambulation in the community.     Baseline  1/28: 18/30    Time  4    Period  Weeks    Status  On-going            Plan - 06/15/18 1509    Clinical Impression Statement  PT reassessed pt's goals and outcome measures this date. Pt has made relatively good progress towards goals. Pt strength remained relatively unchanged but her SLS and tandem balance did improve. Her FGA score went down but feel this is due to pt's generalized fatigue after recent hospital admission secondary to pneumonia. Pt wishes to be done with therapy at this time. PT provided pt with updated balance activities to perform at home and she verbalized understanding. Pt d/c to HEP at this time but was educated she could return with referral if needed and she verbalized understanding.     Rehab Potential  Excellent    PT Frequency  1x / week    PT Duration  4 weeks    PT Treatment/Interventions  ADLs/Self Care Home Management;Aquatic Therapy;Canalith Repostioning;Cryotherapy;Electrical Stimulation;Moist Heat;Traction;DME Instruction;Gait training;Stair training;Functional mobility training;Therapeutic activities;Therapeutic exercise;Balance training;Neuromuscular re-education;Patient/family education;Manual techniques;Passive range of motion;Dry needling;Energy conservation;Splinting;Taping;Vestibular    PT Next Visit Plan  d/c to HEP    PT Home Exercise Plan  eval: bridging, clams,  SLS, red putty; 1/7: hip abd, sidestepping RTB; 1/14: fwd/lat step ups, 1/4 wall squat; 1/28: tandem stance EO, EC, with up/down, r/l head turns, SL vectors, tandem gait    Consulted and Agree with Plan of Care  Patient       Patient will benefit from skilled therapeutic intervention in order to improve the following deficits and impairments:  Abnormal gait, Decreased activity tolerance, Decreased balance, Decreased strength, Difficulty walking  Visit Diagnosis: Unsteadiness on feet - Plan: PT plan of care cert/re-cert  Muscle weakness (generalized) - Plan: PT plan of care cert/re-cert     Problem List Patient Active Problem List   Diagnosis Date Noted  . HCAP (healthcare-associated pneumonia) 06/09/2018  . Severe sepsis ([place]) 06/09/2018  . Generalized weakness 06/09/2018  . Right middle lobe pneumonia ([place]) 06/08/2018  . Acute CVA (cerebrovascular accident) ([place]) 05/03/2018  . Hypothyroidism (acquired) 05/03/2018  . Hypertension 05/03/2018  . Uncontrolled type 2 diabetes mellitus with diabetic nephropathy, with long-term current use of insulin ([place])   . UTI due to extended-spectrum beta lactamase (ESBL) producing Escherichia coli   . Pneumothorax 02/05/2017  . Multiple rib fractures 02/05/2017  . Type 2 diabetes mellitus with  hypoglycemia without coma ([place]) 02/05/2017  . Clavicle fracture 02/05/2017  . Elevated blood pressure reading 02/05/2017  . UTI (urinary tract infection) 02/05/2017  . Back pain at L4-L5 level 06/14/2013  . Muscle weakness (generalized) 12/17/2012  . Pain in joint, shoulder region 12/17/2012  . Shoulder fracture 12/14/2012  . Proximal humerus fracture 11/29/2012  . FRACTURE, TOE, RIGHT 09/10/2009       Geraldine Solar PT, DPT  [facility] [address] [place], [place], [address] Phone: [phone]   Fax:  [phone]  Name: Danielle [name] MRN: [id number] Date of Birth: 1932/01/17

## 2018-06-22 LAB — GLUCOSE, CAPILLARY
GLUCOSE-CAPILLARY: 325 mg/dL — AB (ref 70–99)
Glucose-Capillary: 64 mg/dL — ABNORMAL LOW (ref 70–99)

## 2018-07-16 ENCOUNTER — Ambulatory Visit (HOSPITAL_COMMUNITY)
Admission: RE | Admit: 2018-07-16 | Discharge: 2018-07-16 | Disposition: A | Discharge status: Home / Self Care | Payer: Medicare Other | Source: Ambulatory Visit | Attending: Pulmonary Disease | Admitting: Pulmonary Disease

## 2018-07-16 ENCOUNTER — Other Ambulatory Visit (HOSPITAL_COMMUNITY): Payer: Self-pay | Admitting: Pulmonary Disease

## 2018-07-16 DIAGNOSIS — J189 Pneumonia, unspecified organism: Secondary | ICD-10-CM | POA: Diagnosis not present

## 2018-09-27 ENCOUNTER — Other Ambulatory Visit: Payer: Self-pay

## 2018-09-27 ENCOUNTER — Emergency Department (HOSPITAL_COMMUNITY)
Admission: EM | Admit: 2018-09-27 | Discharge: 2018-09-27 | Disposition: A | Discharge status: Home / Self Care | Payer: Medicare Other | Attending: Emergency Medicine | Admitting: Emergency Medicine

## 2018-09-27 ENCOUNTER — Encounter (HOSPITAL_COMMUNITY): Payer: Self-pay

## 2018-09-27 ENCOUNTER — Emergency Department (HOSPITAL_COMMUNITY): Payer: Medicare Other

## 2018-09-27 DIAGNOSIS — N390 Urinary tract infection, site not specified: Secondary | ICD-10-CM | POA: Diagnosis not present

## 2018-09-27 DIAGNOSIS — Z794 Long term (current) use of insulin: Secondary | ICD-10-CM | POA: Diagnosis not present

## 2018-09-27 DIAGNOSIS — Z79899 Other long term (current) drug therapy: Secondary | ICD-10-CM | POA: Diagnosis not present

## 2018-09-27 DIAGNOSIS — Z7982 Long term (current) use of aspirin: Secondary | ICD-10-CM | POA: Insufficient documentation

## 2018-09-27 DIAGNOSIS — E039 Hypothyroidism, unspecified: Secondary | ICD-10-CM | POA: Insufficient documentation

## 2018-09-27 DIAGNOSIS — I10 Essential (primary) hypertension: Secondary | ICD-10-CM | POA: Diagnosis not present

## 2018-09-27 DIAGNOSIS — Z87891 Personal history of nicotine dependence: Secondary | ICD-10-CM | POA: Diagnosis not present

## 2018-09-27 DIAGNOSIS — R42 Dizziness and giddiness: Secondary | ICD-10-CM

## 2018-09-27 DIAGNOSIS — E1121 Type 2 diabetes mellitus with diabetic nephropathy: Secondary | ICD-10-CM | POA: Diagnosis not present

## 2018-09-27 LAB — CBC WITH DIFFERENTIAL/PLATELET
Abs Immature Granulocytes: 0.01 10*3/uL (ref 0.00–0.07)
Basophils Absolute: 0.1 10*3/uL (ref 0.0–0.1)
Basophils Relative: 1 %
Eosinophils Absolute: 0.2 10*3/uL (ref 0.0–0.5)
Eosinophils Relative: 3 %
HCT: 37.8 % (ref 36.0–46.0)
Hemoglobin: 12.5 g/dL (ref 12.0–15.0)
Immature Granulocytes: 0 %
Lymphocytes Relative: 25 %
Lymphs Abs: 1.9 10*3/uL (ref 0.7–4.0)
MCH: 30.9 pg (ref 26.0–34.0)
MCHC: 33.1 g/dL (ref 30.0–36.0)
MCV: 93.3 fL (ref 80.0–100.0)
Monocytes Absolute: 0.8 10*3/uL (ref 0.1–1.0)
Monocytes Relative: 10 %
Neutro Abs: 4.7 10*3/uL (ref 1.7–7.7)
Neutrophils Relative %: 61 %
Platelets: 255 10*3/uL (ref 150–400)
RBC: 4.05 MIL/uL (ref 3.87–5.11)
RDW: 13.5 % (ref 11.5–15.5)
WBC: 7.7 10*3/uL (ref 4.0–10.5)
nRBC: 0 % (ref 0.0–0.2)

## 2018-09-27 LAB — BASIC METABOLIC PANEL
Anion gap: 11 (ref 5–15)
BUN: 33 mg/dL — ABNORMAL HIGH (ref 8–23)
CO2: 25 mmol/L (ref 22–32)
Calcium: 9.6 mg/dL (ref 8.9–10.3)
Chloride: 99 mmol/L (ref 98–111)
Creatinine, Ser: 1.19 mg/dL — ABNORMAL HIGH (ref 0.44–1.00)
GFR calc Af Amer: 49 mL/min — ABNORMAL LOW (ref >60)
GFR calc non Af Amer: 42 mL/min — ABNORMAL LOW (ref >60)
Glucose, Bld: 244 mg/dL — ABNORMAL HIGH (ref 70–99)
Potassium: 3.5 mmol/L (ref 3.5–5.1)
Sodium: 135 mmol/L (ref 135–145)

## 2018-09-27 LAB — TROPONIN I: Troponin I: <0.03 ng/mL (ref <0.03)

## 2018-09-27 LAB — URINALYSIS, ROUTINE W REFLEX MICROSCOPIC
Bilirubin Urine: NEGATIVE
Glucose, UA: >=500 mg/dL — AB
Hgb urine dipstick: NEGATIVE
Ketones, ur: NEGATIVE mg/dL
Nitrite: NEGATIVE
Protein, ur: NEGATIVE mg/dL
Specific Gravity, Urine: 1.024 (ref 1.005–1.030)
WBC, UA: >50 WBC/hpf — ABNORMAL HIGH (ref 0–5)
pH: 5 (ref 5.0–8.0)

## 2018-09-27 MED ORDER — CEPHALEXIN 500 MG PO CAPS: 500.0000 mg | ORAL_CAPSULE | Freq: Two times a day (BID) | ORAL | 0 refills | Status: AC

## 2018-09-27 MED ORDER — CEPHALEXIN 500 MG PO CAPS
500.0000 mg | ORAL_CAPSULE | Freq: Once | ORAL | Status: AC
  Administered 2018-09-27: 19:00:00 500 mg via ORAL
  Filled 2018-09-27: qty 1

## 2018-09-27 MED ORDER — SODIUM CHLORIDE 0.9 % IV BOLUS
1000.0000 mL | Freq: Once | INTRAVENOUS | Status: AC
  Administered 2018-09-27: 1000 mL via INTRAVENOUS

## 2018-09-27 NOTE — ED Triage Notes (Addendum)
Pt presents to ED with complaints of dizziness and confusion upon waking up with morning at 0900. Per family, pt LKW was last night at bedtime at 2030. Pt denies weakness. Pt oriented x 4 in triage.

## 2018-09-29 NOTE — ED Provider Notes (Signed)
[facility] EMERGENCY DEPARTMENT Provider Note   CSN: [id number] Arrival date & time: 09/27/18  1524    History   Chief Complaint Chief Complaint  Patient presents with  . Dizziness    HPI Danielle [name] is a 85 y.o. female.     HPI   86yF with dizziness. Onset this morning. Felt off balance when walking in her kitchen. Now feels fine. Apparently family felt that she seemed confused although pt denies this. No change in speech. No acute numbness, tingling or loss of strength. No pain.   Past Medical History:  Diagnosis Date  . Arthritis    "some joint pain once in awhile" (05/26/2017)  . History of kidney stones   . Hyperlipemia   . Hypertension   . Hypothyroidism (acquired)   . Pneumothorax 02/05/2017   right-after fall  . Rib fractures 02/05/2017   right side  . Type II diabetes mellitus [facility])     Patient Active Problem List   Diagnosis Date Noted  . HCAP (healthcare-associated pneumonia) 06/09/2018  . Severe sepsis ([name]) 06/09/2018  . Generalized weakness 06/09/2018  . Right middle lobe pneumonia ([place]) 06/08/2018  . Acute CVA (cerebrovascular accident) ([place]) 05/03/2018  . Hypothyroidism (acquired) 05/03/2018  . Hypertension 05/03/2018  . Uncontrolled type 2 diabetes mellitus with diabetic nephropathy, with long-term current use of insulin ([place])   . UTI due to extended-spectrum beta lactamase (ESBL) producing Escherichia coli   . Pneumothorax 02/05/2017  . Multiple rib fractures 02/05/2017  . Type 2 diabetes mellitus with hypoglycemia without coma ([place]) 02/05/2017  . Clavicle fracture 02/05/2017  . Elevated blood pressure reading 02/05/2017  . UTI (urinary tract infection) 02/05/2017  . Back pain at L4-L5 level 06/14/2013  . Muscle weakness (generalized) 12/17/2012  . Pain in joint, shoulder region 12/17/2012  . Shoulder fracture 12/14/2012  . Proximal humerus fracture 11/29/2012  . FRACTURE, TOE, RIGHT 09/10/2009    Past Surgical History:   Procedure Laterality Date  . AUGMENTATION MAMMAPLASTY Bilateral   . DILATION AND CURETTAGE OF UTERUS    . FRACTURE SURGERY    . REVERSE SHOULDER ARTHROPLASTY Right 05/26/2017  . REVERSE SHOULDER ARTHROPLASTY Right 05/26/2017   Procedure: REVERSE RIGHT SHOULDER ARTHROPLASTY;  Surgeon: Meredith Pel, MD;  Location: [place];  Service: Orthopedics;  Laterality: Right;  . TONSILLECTOMY    . TUBAL LIGATION       OB History    Gravida      Para      Term      Preterm      AB      Living  2     SAB      TAB      Ectopic      Multiple      Live Births               Home Medications    Prior to Admission medications   Medication Sig Start Date End Date Taking? Authorizing Provider  aspirin EC 81 MG tablet Take 162 mg by mouth every morning.    Yes [provider]  atorvastatin (LIPITOR) 40 MG tablet Take 1 tablet (40 mg total) by mouth daily at 6 PM. 05/04/18  Yes Patrecia Pour, MD  Calcium Carb-Cholecalciferol (CALCIUM 600 + D PO) Take 1 tablet by mouth daily.   Yes [provider]  guaiFENesin (MUCINEX) 600 MG 12 hr tablet Take 2 tablets (1,200 mg total) by mouth 2 (two) times daily. Patient taking differently:  Take 600 mg by mouth daily.  06/13/18  Yes Sinda Du, MD  insulin lispro (HUMALOG) 100 UNIT/ML injection Inject 8-10 Units into the skin 2 (two) times daily.    Yes [provider]  Multiple Vitamin (MULTIVITAMIN WITH MINERALS) TABS Take 1 tablet by mouth daily.    Yes [provider]  multivitamin-lutein (OCUVITE-LUTEIN) CAPS capsule Take 1 capsule by mouth daily.   Yes [provider]  Omega-3 Fatty Acids (FISH OIL) 1000 MG CAPS Take 1,000 mg by mouth daily.   Yes [provider]  SYNTHROID 100 MCG tablet Take 100 mcg by mouth every morning.  08/21/18  Yes [provider]  TOUJEO SOLOSTAR 300 UNIT/ML SOPN Inject 23-26 Units into the skin every morning.  09/07/18  Yes [provider]   vitamin B-12 (CYANOCOBALAMIN) 1000 MCG tablet Take 1,000 mcg by mouth daily.   Yes [provider]  cephALEXin (KEFLEX) 500 MG capsule Take 1 capsule (500 mg total) by mouth 2 (two) times daily for 5 days. 09/27/18 10/02/18  Virgel Manifold, MD    Family History No family history on file.  Social History Social History   Tobacco Use  . Smoking status: Former Smoker    Packs/day: 1.00    Years: 40.00    Pack years: 40.00    Types: Cigarettes    Last attempt to quit: 1991    Years since quitting: 29.3  . Smokeless tobacco: Never Used  Substance Use Topics  . Alcohol use: No  . Drug use: No     Allergies   Fosamax [alendronate sodium] and Prednisone   Review of Systems Review of Systems  All systems reviewed and negative, other than as noted in HPI.  Physical Exam Updated Vital Signs BP (!) 150/83   Pulse 67   Temp 98.6 F (37 C) (Oral)   Resp 16   Ht 5\' 5"  (1.651 m)   Wt 55.8 kg   SpO2 100%   BMI 20.47 kg/m   Physical Exam Vitals signs and nursing note reviewed.  Constitutional:      General: She is not in acute distress.    Appearance: She is well-developed.  HENT:     Head: Normocephalic and atraumatic.  Eyes:     General:        Right eye: No discharge.        Left eye: No discharge.     Conjunctiva/sclera: Conjunctivae normal.  Neck:     Musculoskeletal: Neck supple.  Cardiovascular:     Rate and Rhythm: Normal rate and regular rhythm.     Heart sounds: Normal heart sounds. No murmur. No friction rub. No gallop.   Pulmonary:     Effort: Pulmonary effort is normal. No respiratory distress.     Breath sounds: Normal breath sounds.  Abdominal:     General: There is no distension.     Palpations: Abdomen is soft.     Tenderness: There is no abdominal tenderness.  Musculoskeletal:        General: No tenderness.  Skin:    General: Skin is warm and dry.  Neurological:     General: No focal deficit present.     Mental Status: She is alert  and oriented to person, place, and time.     Cranial Nerves: No cranial nerve deficit.     Sensory: No sensory deficit.     Motor: No weakness.     Coordination: Coordination normal.  Psychiatric:  Behavior: Behavior normal.        Thought Content: Thought content normal.      ED Treatments / Results  Labs (all labs ordered are listed, but only abnormal results are displayed) Labs Reviewed  URINE CULTURE - Abnormal; Notable for the following components:      Result Value   Culture >=100,000 COLONIES/mL ENTEROCOCCUS FAECALIS (*)    All other components within normal limits  BASIC METABOLIC PANEL - Abnormal; Notable for the following components:   Glucose, Bld 244 (*)    BUN 33 (*)    Creatinine, Ser 1.19 (*)    GFR calc non Af Amer 42 (*)    GFR calc Af Amer 49 (*)    All other components within normal limits  URINALYSIS, ROUTINE W REFLEX MICROSCOPIC - Abnormal; Notable for the following components:   APPearance CLOUDY (*)    Glucose, UA >=500 (*)    Leukocytes,Ua SMALL (*)    WBC, UA >50 (*)    Bacteria, UA RARE (*)    All other components within normal limits  CBC WITH DIFFERENTIAL/PLATELET  TROPONIN I    EKG EKG Interpretation  Date/Time:  Monday Sep 27 2018 15:39:06 EDT Ventricular Rate:  71 PR Interval:    QRS Duration: 142 QT Interval:  452 QTC Calculation: 492 R Axis:   -71 Text Interpretation:  Sinus rhythm Left bundle branch block Baseline wander in lead(s) V5 Confirmed by Virgel Manifold [phone]) on 09/27/2018 3:48:01 PM   Radiology Ct Head Wo Contrast  Result Date: 09/27/2018 CLINICAL DATA:  Dizziness and confusion EXAM: CT HEAD WITHOUT CONTRAST TECHNIQUE: Contiguous axial images were obtained from the base of the skull through the vertex without intravenous contrast. COMPARISON:  Head CT May 03, 2018 and brain MR May 03, 2018 FINDINGS: Brain: There is age related volume loss. There is a mass arising from the anterior falx on the left, much  better seen on MR, measuring 2.8 x 2.8 cm. This mass causes localized impression on the leftward aspect of the rostrum of the corpus callosum, similar to prior MR. This mass is felt to be consistent with a meningioma. It does not contain appreciable calcification. There is no appreciable surrounding edema. No other mass is evident. There is no hemorrhage, extra-axial fluid collection, or midline shift. There is evidence of a prior infarct in the posterior mid right frontal lobe. Infarction is also noted in the anterior right lentiform nucleus as well as in the head of the caudate nucleus on the right, stable from prior studies. Elsewhere, there is mild periventricular small vessel disease in the centra semiovale bilaterally. There is evidence of a prior infarct at the gray-white compartment junction in the posterior right frontal lobe near the junction with the parietal lobe on the right, stable. No acute appearing infarct is evident on this study. Vascular: There is no evident hyperdense vessel. There is calcification in each carotid siphon region. Skull: The bony calvarium appears intact. Sinuses/Orbits: There is mucosal thickening in several ethmoid air cells. Other visualized paranasal sinuses are clear. Visualized orbits appear symmetric bilaterally. Other: Mastoid air cells are clear. There is debris in each external auditory canal. IMPRESSION: 1. Left anterior parafalcine meningioma, stable. There is impression on the left-sided rostrum of the corpus callosum, stable. No appreciable surrounding edema. No other mass evident. No hemorrhage. 2. Prior right basal ganglia and right frontal infarcts. Small infarct at the gray-white junction of the posterior right centrum semiovale near the junction with the right frontal  and parietal lobes, better seen on prior MR. Patchy periventricular small vessel disease noted. No acute infarct is demonstrable. 3.  Foci of arterial vascular calcification noted. 4.  Mucosal  thickening in several ethmoid air cells. 5.  Probable cerumen in each external auditory canal. Electronically Signed   By: Lowella Grip III M.D.   On: 09/27/2018 16:52    Procedures Procedures (including critical care time)  Medications Ordered in ED Medications  sodium chloride 0.9 % bolus 1,000 mL (0 mLs Intravenous Stopped 09/27/18 1905)  cephALEXin (KEFLEX) capsule 500 mg (500 mg Oral Given 09/27/18 1921)     Initial Impression / Assessment and Plan / ED Course  I have reviewed the triage vital signs and the nursing notes.  Pertinent labs & imaging results that were available during my care of the patient were reviewed by me and considered in my medical decision making (see chart for details).       86yF with dizziness. Possible vertigo? Neuro exam nonfocal. Head Ct w/o acute abnormality. Does appear to have UTI. No specific urinary symptoms but with her age and vague symptoms, will treat. Outpt FU. Return precautions discussed.   Final Clinical Impressions(s) / ED Diagnoses   Final diagnoses:  Dizziness  Urinary tract infection without hematuria, site unspecified    ED Discharge Orders         Ordered    cephALEXin (KEFLEX) 500 MG capsule  2 times daily     09/27/18 Doran Heater           Virgel Manifold, MD 09/29/18 1645

## 2018-09-30 LAB — URINE CULTURE: Culture: >=100000 — AB

## 2018-10-01 ENCOUNTER — Telehealth: Payer: Self-pay

## 2018-10-01 NOTE — Progress Notes (Signed)
ED Antimicrobial Stewardship Positive Culture Follow Up   Danielle [name] is an 85 y.o. female who presented to [facility] on 09/27/2018 with a chief complaint of dizziness. No UTI symptoms noted. UA with small leukocytes, nitrite negative, rare bacteria, 6-10 squamous epithelial cells, > 50 WBC. Patient discharged on cephalexin for 5 days. Urine culture grew E. Faecalis. Patient likely with asymptomatic bacteruria since no UTI symptoms documented on ED admission, but since cephalexin does not provide coverage for organism will follow-up with patient.  Plan: - Call patient for follow-up - If patient with UTI symptoms, treat with Amoxicillin 500 mg PO TID for 5 days - If no UTI symptoms - do not treat  Chief Complaint  Patient presents with  . Dizziness    Recent Results (from the past 720 hour(s))  Urine culture     Status: Abnormal   Collection Time: 09/27/18  7:16 PM  Result Value Ref Range Status   Specimen Description   Final    URINE, RANDOM Performed at [facility], [address], [place], [place] [address]    Special Requests   Final    NONE Performed at [facility], [address], [place], [place] [address]    Culture >=100,000 COLONIES/mL ENTEROCOCCUS FAECALIS (A)  Final   Report Status 09/30/2018 FINAL  Final   Organism ID, Bacteria ENTEROCOCCUS FAECALIS (A)  Final      Susceptibility   Enterococcus faecalis - MIC*    AMPICILLIN <=2 SENSITIVE Sensitive     LEVOFLOXACIN 1 SENSITIVE Sensitive     NITROFURANTOIN <=16 SENSITIVE Sensitive     VANCOMYCIN 2 SENSITIVE Sensitive     * >=100,000 COLONIES/mL ENTEROCOCCUS FAECALIS   ED Provider: Shelby Dubin, PA-C  Thank you for allowing pharmacy to be a part of this patient's care.  Leron Croak, PharmD PGY1 Pharmacy Resident Phone: [phone]  Please check AMION for all [place] phone numbers 10/01/2018, 8:46 AM

## 2018-10-01 NOTE — Telephone Encounter (Signed)
Called for symptom check for UC done [facility] ED on 09/27/2018  Denies any further problems. " Feels Wonderful"  NO abx ordered

## 2018-10-21 ENCOUNTER — Encounter: Payer: Self-pay | Admitting: Pulmonary Disease

## 2018-12-23 ENCOUNTER — Emergency Department (HOSPITAL_COMMUNITY): Payer: Medicare Other

## 2018-12-23 ENCOUNTER — Other Ambulatory Visit: Payer: Self-pay

## 2018-12-23 ENCOUNTER — Encounter (HOSPITAL_COMMUNITY): Payer: Self-pay | Admitting: Emergency Medicine

## 2018-12-23 ENCOUNTER — Observation Stay (HOSPITAL_COMMUNITY): Payer: Medicare Other

## 2018-12-23 ENCOUNTER — Inpatient Hospital Stay (HOSPITAL_COMMUNITY)
Admission: EM | Admit: 2018-12-23 | Discharge: 2018-12-27 | DRG: 066 | Disposition: A | Discharge status: Home Health | Payer: Medicare Other | Attending: Pulmonary Disease | Admitting: Pulmonary Disease

## 2018-12-23 DIAGNOSIS — E785 Hyperlipidemia, unspecified: Secondary | ICD-10-CM | POA: Diagnosis not present

## 2018-12-23 DIAGNOSIS — I10 Essential (primary) hypertension: Secondary | ICD-10-CM | POA: Diagnosis present

## 2018-12-23 DIAGNOSIS — R410 Disorientation, unspecified: Secondary | ICD-10-CM | POA: Diagnosis not present

## 2018-12-23 DIAGNOSIS — IMO0002 Reserved for concepts with insufficient information to code with codable children: Secondary | ICD-10-CM

## 2018-12-23 DIAGNOSIS — Z20828 Contact with and (suspected) exposure to other viral communicable diseases: Secondary | ICD-10-CM | POA: Diagnosis present

## 2018-12-23 DIAGNOSIS — I63511 Cerebral infarction due to unspecified occlusion or stenosis of right middle cerebral artery: Principal | ICD-10-CM | POA: Diagnosis present

## 2018-12-23 DIAGNOSIS — Z8673 Personal history of transient ischemic attack (TIA), and cerebral infarction without residual deficits: Secondary | ICD-10-CM

## 2018-12-23 DIAGNOSIS — D320 Benign neoplasm of cerebral meninges: Secondary | ICD-10-CM | POA: Diagnosis present

## 2018-12-23 DIAGNOSIS — E1165 Type 2 diabetes mellitus with hyperglycemia: Secondary | ICD-10-CM

## 2018-12-23 DIAGNOSIS — Z79899 Other long term (current) drug therapy: Secondary | ICD-10-CM

## 2018-12-23 DIAGNOSIS — Z96611 Presence of right artificial shoulder joint: Secondary | ICD-10-CM | POA: Diagnosis present

## 2018-12-23 DIAGNOSIS — Z794 Long term (current) use of insulin: Secondary | ICD-10-CM

## 2018-12-23 DIAGNOSIS — Z8249 Family history of ischemic heart disease and other diseases of the circulatory system: Secondary | ICD-10-CM

## 2018-12-23 DIAGNOSIS — R2990 Unspecified symptoms and signs involving the nervous system: Secondary | ICD-10-CM | POA: Diagnosis not present

## 2018-12-23 DIAGNOSIS — I447 Left bundle-branch block, unspecified: Secondary | ICD-10-CM | POA: Diagnosis present

## 2018-12-23 DIAGNOSIS — G8324 Monoplegia of upper limb affecting left nondominant side: Secondary | ICD-10-CM | POA: Diagnosis present

## 2018-12-23 DIAGNOSIS — E538 Deficiency of other specified B group vitamins: Secondary | ICD-10-CM | POA: Diagnosis present

## 2018-12-23 DIAGNOSIS — R29706 NIHSS score 6: Secondary | ICD-10-CM | POA: Diagnosis present

## 2018-12-23 DIAGNOSIS — E1121 Type 2 diabetes mellitus with diabetic nephropathy: Secondary | ICD-10-CM | POA: Diagnosis present

## 2018-12-23 DIAGNOSIS — E039 Hypothyroidism, unspecified: Secondary | ICD-10-CM | POA: Diagnosis not present

## 2018-12-23 DIAGNOSIS — Z833 Family history of diabetes mellitus: Secondary | ICD-10-CM

## 2018-12-23 DIAGNOSIS — Z79890 Hormone replacement therapy (postmenopausal): Secondary | ICD-10-CM

## 2018-12-23 DIAGNOSIS — I639 Cerebral infarction, unspecified: Secondary | ICD-10-CM

## 2018-12-23 DIAGNOSIS — Z87891 Personal history of nicotine dependence: Secondary | ICD-10-CM

## 2018-12-23 DIAGNOSIS — G459 Transient cerebral ischemic attack, unspecified: Secondary | ICD-10-CM

## 2018-12-23 DIAGNOSIS — Z888 Allergy status to other drugs, medicaments and biological substances status: Secondary | ICD-10-CM

## 2018-12-23 DIAGNOSIS — Z96612 Presence of left artificial shoulder joint: Secondary | ICD-10-CM | POA: Diagnosis present

## 2018-12-23 DIAGNOSIS — Z7982 Long term (current) use of aspirin: Secondary | ICD-10-CM

## 2018-12-23 DIAGNOSIS — J449 Chronic obstructive pulmonary disease, unspecified: Secondary | ICD-10-CM | POA: Diagnosis present

## 2018-12-23 HISTORY — DX: Cerebral infarction, unspecified: I63.9

## 2018-12-23 HISTORY — DX: Unspecified symptoms and signs involving the nervous system: R29.90

## 2018-12-23 LAB — BASIC METABOLIC PANEL
Anion gap: 6 (ref 5–15)
BUN: 21 mg/dL (ref 8–23)
CO2: 21 mmol/L — ABNORMAL LOW (ref 22–32)
Calcium: 8.5 mg/dL — ABNORMAL LOW (ref 8.9–10.3)
Chloride: 110 mmol/L (ref 98–111)
Creatinine, Ser: 0.86 mg/dL (ref 0.44–1.00)
GFR calc Af Amer: >60 mL/min (ref >60)
GFR calc non Af Amer: >60 mL/min (ref >60)
Glucose, Bld: 329 mg/dL — ABNORMAL HIGH (ref 70–99)
Potassium: 3.9 mmol/L (ref 3.5–5.1)
Sodium: 137 mmol/L (ref 135–145)

## 2018-12-23 LAB — CBC WITH DIFFERENTIAL/PLATELET
Abs Immature Granulocytes: 0.01 10*3/uL (ref 0.00–0.07)
Basophils Absolute: 0.1 10*3/uL (ref 0.0–0.1)
Basophils Relative: 1 %
Eosinophils Absolute: 0.3 10*3/uL (ref 0.0–0.5)
Eosinophils Relative: 4 %
HCT: 37.1 % (ref 36.0–46.0)
Hemoglobin: 12.1 g/dL (ref 12.0–15.0)
Immature Granulocytes: 0 %
Lymphocytes Relative: 27 %
Lymphs Abs: 1.7 10*3/uL (ref 0.7–4.0)
MCH: 30.1 pg (ref 26.0–34.0)
MCHC: 32.6 g/dL (ref 30.0–36.0)
MCV: 92.3 fL (ref 80.0–100.0)
Monocytes Absolute: 0.7 10*3/uL (ref 0.1–1.0)
Monocytes Relative: 11 %
Neutro Abs: 3.7 10*3/uL (ref 1.7–7.7)
Neutrophils Relative %: 57 %
Platelets: 281 10*3/uL (ref 150–400)
RBC: 4.02 MIL/uL (ref 3.87–5.11)
RDW: 14.5 % (ref 11.5–15.5)
WBC: 6.5 10*3/uL (ref 4.0–10.5)
nRBC: 0 % (ref 0.0–0.2)

## 2018-12-23 LAB — TSH: TSH: 0.042 u[IU]/mL — ABNORMAL LOW (ref 0.350–4.500)

## 2018-12-23 LAB — SARS CORONAVIRUS 2 (TAT 6-24 HRS): SARS Coronavirus 2: NEGATIVE

## 2018-12-23 LAB — CBG MONITORING, ED: Glucose-Capillary: 218 mg/dL — ABNORMAL HIGH (ref 70–99)

## 2018-12-23 MED ORDER — ACETAMINOPHEN 650 MG RE SUPP: 650.0000 mg | RECTAL | Status: DC | PRN

## 2018-12-23 MED ORDER — ASPIRIN 300 MG RE SUPP
300.0000 mg | Freq: Every day | RECTAL | Status: DC
  Filled 2018-12-23 (×2): qty 1

## 2018-12-23 MED ORDER — LEVOTHYROXINE SODIUM 100 MCG PO TABS
100.0000 ug | ORAL_TABLET | Freq: Every morning | ORAL | Status: DC
  Administered 2018-12-24 – 2018-12-27 (×4): 100 ug via ORAL
  Filled 2018-12-23: qty 1
  Filled 2018-12-23: qty 2
  Filled 2018-12-23 (×2): qty 1

## 2018-12-23 MED ORDER — SENNOSIDES-DOCUSATE SODIUM 8.6-50 MG PO TABS: 1.0000 | ORAL_TABLET | Freq: Every evening | ORAL | Status: DC | PRN

## 2018-12-23 MED ORDER — GUAIFENESIN ER 600 MG PO TB12
600.0000 mg | ORAL_TABLET | Freq: Every day | ORAL | Status: DC
  Administered 2018-12-23 – 2018-12-27 (×5): 600 mg via ORAL
  Filled 2018-12-23 (×5): qty 1

## 2018-12-23 MED ORDER — HEPARIN SODIUM (PORCINE) 5000 UNIT/ML IJ SOLN
5000.0000 [IU] | Freq: Three times a day (TID) | INTRAMUSCULAR | Status: DC
  Administered 2018-12-23 – 2018-12-27 (×7): 5000 [IU] via SUBCUTANEOUS
  Filled 2018-12-23 (×8): qty 1

## 2018-12-23 MED ORDER — SODIUM CHLORIDE 0.9 % IV SOLN
INTRAVENOUS | Status: DC
  Administered 2018-12-23 – 2018-12-25 (×4): via INTRAVENOUS

## 2018-12-23 MED ORDER — STROKE: EARLY STAGES OF RECOVERY BOOK
Freq: Once | Status: AC
  Administered 2018-12-23: 15:00:00
  Filled 2018-12-23: qty 1

## 2018-12-23 MED ORDER — ACETAMINOPHEN 160 MG/5ML PO SOLN: 650.0000 mg | ORAL | Status: DC | PRN

## 2018-12-23 MED ORDER — OMEGA-3-ACID ETHYL ESTERS 1 G PO CAPS
1.0000 g | ORAL_CAPSULE | Freq: Every day | ORAL | Status: DC
  Administered 2018-12-25 – 2018-12-27 (×3): 1 g via ORAL
  Filled 2018-12-23 (×4): qty 1

## 2018-12-23 MED ORDER — ASPIRIN 81 MG PO CHEW
162.0000 mg | CHEWABLE_TABLET | Freq: Once | ORAL | Status: AC
  Administered 2018-12-23: 15:00:00 162 mg via ORAL
  Filled 2018-12-23: qty 2

## 2018-12-23 MED ORDER — INSULIN ASPART 100 UNIT/ML ~~LOC~~ SOLN
0.0000 [IU] | Freq: Three times a day (TID) | SUBCUTANEOUS | Status: DC
  Administered 2018-12-23: 18:00:00 3 [IU] via SUBCUTANEOUS
  Administered 2018-12-24: 10:00:00 1 [IU] via SUBCUTANEOUS
  Administered 2018-12-24: 13:00:00 3 [IU] via SUBCUTANEOUS
  Administered 2018-12-24: 19:00:00 5 [IU] via SUBCUTANEOUS
  Administered 2018-12-25 (×2): 3 [IU] via SUBCUTANEOUS
  Administered 2018-12-26: 18:00:00 2 [IU] via SUBCUTANEOUS
  Administered 2018-12-26: 1 [IU] via SUBCUTANEOUS
  Administered 2018-12-26: 13:00:00 7 [IU] via SUBCUTANEOUS
  Administered 2018-12-27: 09:00:00 5 [IU] via SUBCUTANEOUS
  Filled 2018-12-23: qty 1

## 2018-12-23 MED ORDER — ADULT MULTIVITAMIN W/MINERALS CH
1.0000 | ORAL_TABLET | Freq: Every day | ORAL | Status: DC
  Administered 2018-12-23 – 2018-12-27 (×5): 1 via ORAL
  Filled 2018-12-23 (×5): qty 1

## 2018-12-23 MED ORDER — VITAMIN B-12 1000 MCG PO TABS
1000.0000 ug | ORAL_TABLET | Freq: Every day | ORAL | Status: DC
  Administered 2018-12-24 – 2018-12-27 (×4): 1000 ug via ORAL
  Filled 2018-12-23 (×4): qty 1

## 2018-12-23 MED ORDER — ATORVASTATIN CALCIUM 40 MG PO TABS
40.0000 mg | ORAL_TABLET | Freq: Every day | ORAL | Status: DC
  Administered 2018-12-23 – 2018-12-26 (×3): 40 mg via ORAL
  Filled 2018-12-23 (×3): qty 1

## 2018-12-23 MED ORDER — ACETAMINOPHEN 325 MG PO TABS: 650.0000 mg | ORAL_TABLET | ORAL | Status: DC | PRN

## 2018-12-23 MED ORDER — INSULIN GLARGINE 100 UNIT/ML ~~LOC~~ SOLN
15.0000 [IU] | Freq: Every morning | SUBCUTANEOUS | Status: DC
  Administered 2018-12-23 – 2018-12-27 (×5): 15 [IU] via SUBCUTANEOUS
  Filled 2018-12-23 (×7): qty 0.15

## 2018-12-23 MED ORDER — ASPIRIN 325 MG PO TABS
325.0000 mg | ORAL_TABLET | Freq: Every day | ORAL | Status: DC
  Administered 2018-12-24 – 2018-12-27 (×4): 325 mg via ORAL
  Filled 2018-12-23 (×4): qty 1

## 2018-12-23 NOTE — TOC Initial Note (Signed)
Transition of Care [facility]) - Initial/Assessment Note    Patient Details  Name: Danielle [name] MRN: [id number] Date of Birth: Feb 11, 1932  [facility] CM/SW Contact:    Bernardette Waldron Dimitri Ped, LCSW Phone Number: 12/23/2018, 9:06 PM  Clinical Narrative:  CSW in contact with pt who is being admitted after experiencing stroke like symptoms. CSW listens as patient explains that she has had a stroke in the past. Pt reports that she lives at home with her husband who provides much of the support she receives. Pt goes into detail and states that her husband and daughter provide assistance with meals, bathing, transportation to medical appointment and ect.     Pt reports that she has a cane and walker that she uses to ambulate. Pt has a hx of home health but can not recall which agency she received services from.   Pt provided verbal permission for CSW to contact her husband and daughter [name]. Beth states that the family has visited the topic of her mother getting nurse aide services. CSW encouraged family to begin deciding how much they are willing to spend and to discuss how many hours they feel the pt would need.             TOC team will continue to follow pt for any discharge needs.   [place] LCSWA Transitions of Care  Clinical Social Worker  Ph: [phone]  Expected Discharge Plan: [place] Barriers to Discharge: Continued Medical Work up   Patient Goals and CMS Choice Patient states their goals for this hospitalization and ongoing recovery are:: to return home under the supervision of her husband      Expected Discharge Plan and Services Expected Discharge Plan: [place] In-house Referral: Clinical Social Work     Living arrangements for the past 2 months: Single Family Home                                      Prior Living Arrangements/Services Living arrangements for the past 2 months: Single Family  Home Lives with:: Spouse Patient language and need for interpreter reviewed:: Yes Do you feel safe going back to the place where you live?: Yes      Need for Family Participation in Patient Care: Yes (Comment) Care giver support system in place?: Yes (comment)   Criminal Activity/Legal Involvement Pertinent to Current Situation/Hospitalization: No - Comment as needed  Activities of Daily Living      Permission Sought/Granted Permission sought to share information with : Family Supports Permission granted to share information with : Yes, Verbal Permission Granted  Share Information with NAME: Bjorn Loser     Permission granted to share info w Relationship: Daughter  Permission granted to share info w Contact Information: Ph: [phone]  Emotional Assessment Appearance:: Other (Comment Required(TOC assessment conducted over the phone) Attitude/Demeanor/Rapport: Engaged, Gracious Affect (typically observed): Accepting Orientation: : Oriented to Self, Oriented to Place, Oriented to  Time, Oriented to Situation   Psych Involvement: No (comment)  Admission diagnosis:  ARM NUMBNESS X 1 DAY Patient Active Problem List   Diagnosis Date Noted  . Stroke-like symptoms 12/23/2018  . HCAP (healthcare-associated pneumonia) 06/09/2018  . Severe sepsis ([place]) 06/09/2018  . Generalized weakness 06/09/2018  . Right middle lobe pneumonia ([place]) 06/08/2018  . Acute CVA (cerebrovascular accident) ([place]) 05/03/2018  . Hypothyroidism (acquired) 05/03/2018  .  Hypertension 05/03/2018  . Uncontrolled type 2 diabetes mellitus with diabetic nephropathy, with long-term current use of insulin ([place])   . UTI due to extended-spectrum beta lactamase (ESBL) producing Escherichia coli   . Pneumothorax 02/05/2017  . Multiple rib fractures 02/05/2017  . Type 2 diabetes mellitus with hypoglycemia without coma ([place]) 02/05/2017  . Clavicle fracture 02/05/2017  . Elevated blood pressure reading 02/05/2017  . UTI  (urinary tract infection) 02/05/2017  . Back pain at L4-L5 level 06/14/2013  . Muscle weakness (generalized) 12/17/2012  . Pain in joint, shoulder region 12/17/2012  . Shoulder fracture 12/14/2012  . Proximal humerus fracture 11/29/2012  . FRACTURE, TOE, RIGHT 09/10/2009   PCP:  Sinda Du, MD Pharmacy:   [id number] - [place], [address] [place] [address] Phone: [phone] Fax: [phone]     Social Determinants of Health (SDOH) Interventions    Readmission Risk Interventions No flowsheet data found.

## 2018-12-23 NOTE — ED Triage Notes (Signed)
Patient states she has a history of mini strokes.  Last one was last year around sept/oct.  Left arm numbness started 2 days ago.  Today she is not able to move arm at all.

## 2018-12-23 NOTE — ED Provider Notes (Signed)
[facility] EMERGENCY DEPARTMENT Provider Note   CSN: [id number] Arrival date & time: 12/23/18  2025     History   Chief Complaint No chief complaint on file.   HPI GEARL KIMBROUGH is a 85 y.o. female.     HPI   86yF with L arm weakness. Onset Sunday. Persistent since. Entire LUE but worst in hand. Denies any weakness elsewhere. No pain. No change in speech, vision or sensation. Hx of CVA. Reports no chornic deficits. Previously on ASA/plavix but then changed to 182 mg aspirin daily. She reports compliance with medications. She is followed by Dr Merlene Laughter.   Past Medical History:  Diagnosis Date   Arthritis    "some joint pain once in awhile" (05/26/2017)   History of kidney stones    Hyperlipemia    Hypertension    Hypothyroidism (acquired)    Pneumothorax 02/05/2017   right-after fall   Rib fractures 02/05/2017   right side   Type II diabetes mellitus ([place])     Patient Active Problem List   Diagnosis Date Noted   HCAP (healthcare-associated pneumonia) 06/09/2018   Severe sepsis ([place]) 06/09/2018   Generalized weakness 06/09/2018   Right middle lobe pneumonia ([place]) 06/08/2018   Acute CVA (cerebrovascular accident) ([place]) 05/03/2018   Hypothyroidism (acquired) 05/03/2018   Hypertension 05/03/2018   Uncontrolled type 2 diabetes mellitus with diabetic nephropathy, with long-term current use of insulin ([place])    UTI due to extended-spectrum beta lactamase (ESBL) producing Escherichia coli    Pneumothorax 02/05/2017   Multiple rib fractures 02/05/2017   Type 2 diabetes mellitus with hypoglycemia without coma ([place]) 02/05/2017   Clavicle fracture 02/05/2017   Elevated blood pressure reading 02/05/2017   UTI (urinary tract infection) 02/05/2017   Back pain at L4-L5 level 06/14/2013   Muscle weakness (generalized) 12/17/2012   Pain in joint, shoulder region 12/17/2012   Shoulder fracture 12/14/2012   Proximal humerus fracture 11/29/2012    FRACTURE, TOE, RIGHT 09/10/2009    Past Surgical History:  Procedure Laterality Date   AUGMENTATION MAMMAPLASTY Bilateral    DILATION AND CURETTAGE OF UTERUS     FRACTURE SURGERY     REVERSE SHOULDER ARTHROPLASTY Right 05/26/2017   REVERSE SHOULDER ARTHROPLASTY Right 05/26/2017   Procedure: REVERSE RIGHT SHOULDER ARTHROPLASTY;  Surgeon: Meredith Pel, MD;  Location: [facility];  Service: Orthopedics;  Laterality: Right;   TONSILLECTOMY     TUBAL LIGATION       OB History    Gravida      Para      Term      Preterm      AB      Living  2     SAB      TAB      Ectopic      Multiple      Live Births               Home Medications    Prior to Admission medications   Medication Sig Start Date End Date Taking? Authorizing Provider  aspirin EC 81 MG tablet Take 162 mg by mouth every morning.     [provider]  atorvastatin (LIPITOR) 40 MG tablet Take 1 tablet (40 mg total) by mouth daily at 6 PM. 05/04/18   Patrecia Pour, MD  Calcium Carb-Cholecalciferol (CALCIUM 600 + D PO) Take 1 tablet by mouth daily.    [provider]  guaiFENesin (MUCINEX) 600 MG 12 hr tablet Take 2 tablets (1,200  mg total) by mouth 2 (two) times daily. Patient taking differently: Take 600 mg by mouth daily.  06/13/18   Sinda Du, MD  insulin lispro (HUMALOG) 100 UNIT/ML injection Inject 8-10 Units into the skin 2 (two) times daily.     [provider]  Multiple Vitamin (MULTIVITAMIN WITH MINERALS) TABS Take 1 tablet by mouth daily.     [provider]  multivitamin-lutein (OCUVITE-LUTEIN) CAPS capsule Take 1 capsule by mouth daily.    [provider]  Omega-3 Fatty Acids (FISH OIL) 1000 MG CAPS Take 1,000 mg by mouth daily.    [provider]  SYNTHROID 100 MCG tablet Take 100 mcg by mouth every morning.  08/21/18   [provider]  TOUJEO SOLOSTAR 300 UNIT/ML SOPN Inject 23-26 Units into the skin every morning.   09/07/18   [provider]  vitamin B-12 (CYANOCOBALAMIN) 1000 MCG tablet Take 1,000 mcg by mouth daily.    [provider]    Family History History reviewed. No pertinent family history.  Social History Social History   Tobacco Use   Smoking status: Former Smoker    Packs/day: 1.00    Years: 40.00    Pack years: 40.00    Types: Cigarettes    Quit date: 1991    Years since quitting: 29.6   Smokeless tobacco: Never Used  Substance Use Topics   Alcohol use: No   Drug use: No     Allergies   Fosamax [alendronate sodium] and Prednisone   Review of Systems Review of Systems  All systems reviewed and negative, other than as noted in HPI.  Physical Exam Updated Vital Signs BP (!) 156/73 (BP Location: Right Arm)    Pulse 80    Temp 97.9 F (36.6 C) (Oral)    Resp 16    Ht 5\' 5"  (1.651 m)    Wt 59 kg    SpO2 100%    BMI 21.63 kg/m   Physical Exam Vitals signs and nursing note reviewed.  Constitutional:      General: She is not in acute distress.    Appearance: She is well-developed.  HENT:     Head: Normocephalic and atraumatic.  Eyes:     General:        Right eye: No discharge.        Left eye: No discharge.     Conjunctiva/sclera: Conjunctivae normal.  Neck:     Musculoskeletal: Neck supple.  Cardiovascular:     Rate and Rhythm: Normal rate and regular rhythm.     Heart sounds: Normal heart sounds. No murmur. No friction rub. No gallop.   Pulmonary:     Effort: Pulmonary effort is normal. No respiratory distress.     Breath sounds: Normal breath sounds.  Abdominal:     General: There is no distension.     Palpations: Abdomen is soft.     Tenderness: There is no abdominal tenderness.  Musculoskeletal:        General: No tenderness.  Skin:    General: Skin is warm and dry.  Neurological:     Mental Status: She is alert and oriented to person, place, and time.     Cranial Nerves: No cranial nerve deficit.     Sensory: No sensory  deficit.     Comments: Speech clear. Following commands. CN 2-12 intact. Strength 5/5 RUE and b/l LE. 2/5 LUE at shoulder and with flex/extension. Sensation intact to light touch.   Psychiatric:  Behavior: Behavior normal.        Thought Content: Thought content normal.      ED Treatments / Results  Labs (all labs ordered are listed, but only abnormal results are displayed) Labs Reviewed  BASIC METABOLIC PANEL - Abnormal; Notable for the following components:      Result Value   CO2 21 (*)    Glucose, Bld 329 (*)    Calcium 8.5 (*)    All other components within normal limits  TSH - Abnormal; Notable for the following components:   TSH 0.042 (*)    All other components within normal limits  HEMOGLOBIN A1C - Abnormal; Notable for the following components:   Hgb A1c MFr Bld 10.3 (*)    All other components within normal limits  GLUCOSE, CAPILLARY - Abnormal; Notable for the following components:   Glucose-Capillary 123 (*)    All other components within normal limits  GLUCOSE, CAPILLARY - Abnormal; Notable for the following components:   Glucose-Capillary 238 (*)    All other components within normal limits  GLUCOSE, CAPILLARY - Abnormal; Notable for the following components:   Glucose-Capillary 289 (*)    All other components within normal limits  BLOOD GAS, ARTERIAL - Abnormal; Notable for the following components:   pH, Arterial 7.453 (*)    All other components within normal limits  GLUCOSE, CAPILLARY - Abnormal; Notable for the following components:   Glucose-Capillary 153 (*)    All other components within normal limits  GLUCOSE, CAPILLARY - Abnormal; Notable for the following components:   Glucose-Capillary 184 (*)    All other components within normal limits  CBG MONITORING, ED - Abnormal; Notable for the following components:   Glucose-Capillary 218 (*)    All other components within normal limits  CBG MONITORING, ED - Abnormal; Notable for the following  components:   Glucose-Capillary 133 (*)    All other components within normal limits  SARS CORONAVIRUS 2  URINE CULTURE  CBC WITH DIFFERENTIAL/PLATELET  LIPID PANEL  URINALYSIS, ROUTINE W REFLEX MICROSCOPIC    EKG EKG Interpretation  Date/Time:  Thursday December 23 2018 10:11:51 EDT Ventricular Rate:  72 PR Interval:    QRS Duration: 137 QT Interval:  412 QTC Calculation: 451 R Axis:   -72 Text Interpretation:  Sinus rhythm Atrial premature complex Left bundle branch block Baseline wander in lead(s) V6 Confirmed by Virgel Manifold [phone]) on 12/23/2018 10:14:23 AM   Radiology Ct Head Wo Contrast  Result Date: 12/23/2018 CLINICAL DATA:  Left upper extremity weakness EXAM: CT HEAD WITHOUT CONTRAST TECHNIQUE: Contiguous axial images were obtained from the base of the skull through the vertex without intravenous contrast. COMPARISON:  09/27/2018 FINDINGS: Brain: Redemonstration of a mass arising from the anterior falx on the left measuring approximately 2.6 x 2.8 cm, stable in size compared to prior. Internal focus of low-density likely internal cystic change, also stable from prior. Mass results in mild mass effect on the anterior horn of the left lateral ventricle. No appreciable surrounding edema. Encephalomalacia in the right frontal lobe from prior right MCA distribution infarct. Additional areas of encephalomalacia in the right basal ganglia from prior infarcts, also unchanged. No evidence of new infarct, hemorrhage, or extra-axial fluid collection. Scattered low-density changes within the periventricular and subcortical white matter compatible with chronic microvascular ischemic change. Ventricular size and configuration is unchanged. Vascular: Atherosclerotic calcifications involving the large vessels of the skull base. No unexpected hyperdense vessel. Skull: Normal. Negative for fracture or focal lesion. Sinuses/Orbits: No  acute finding. Other: None. IMPRESSION: 1. No evidence of an acute  intracranial abnormality. 2. Stable size of left anterior parafalcine meningioma. 3. Chronic microvascular ischemic changes as well as prior right MCA distribution infarct and prior right basal ganglia lacunar infarcts. Electronically Signed   By: Davina Poke M.D.   On: 12/23/2018 12:02   Mr Angio Head Wo Contrast  Result Date: 12/23/2018 CLINICAL DATA:  Left arm weakness. EXAM: MRI HEAD WITHOUT CONTRAST MRA HEAD WITHOUT CONTRAST TECHNIQUE: Multiplanar, multiecho pulse sequences of the brain and surrounding structures were obtained without intravenous contrast. Angiographic images of the head were obtained using MRA technique without contrast. COMPARISON:  Head CT 12/23/2018.  Head MRI and MRA 05/03/2018. FINDINGS: MRI HEAD FINDINGS Brain: There are multiple small foci of restricted diffusion in the right MCA territory consistent with acute infarcts primarily involving cortex of the right frontal and parietal lobes. A chronic right MCA infarct is again noted involving the insula, basal ganglia, and frontal lobe with ex vacuo dilatation of the right frontal horn and hemosiderin staining. Scattered T2 hyperintensities elsewhere in the cerebral white matter bilaterally are similar to the prior MRI and nonspecific but compatible with mild chronic small vessel ischemic disease. An extra-axial mass anteriorly along the falx on the left has not significantly changed in size from the prior MRI and measures 2.2 x 2.8 x 3.7 cm. There is local mass effect on the left frontal lobe without edema. Mild generalized cerebral atrophy is not greater than expected for age. Vascular: Chronic right ICA occlusion. Skull and upper cervical spine: Unremarkable bone marrow signal. Sinuses/Orbits: Bilateral cataract extraction. Paranasal sinuses and mastoid air cells are clear. Other: None. MRA HEAD FINDINGS The visualized distal vertebral arteries are patent to the basilar with the left being dominant. The basilar artery is patent  with minimal narrowing distally. There is a large left posterior communicating artery. Severe bilateral P2 stenoses are again noted. There is chronic occlusion of the right ICA with distal reconstitution near the terminus. The right M1 and proximal M2 segments are poorly visualized, more so than on the prior MRA and suggesting severely reduced flow. There is some reconstitution of more robust flow related enhancement in the more distal right MCA branches. No significant flow related enhancement is apparent today in the right A1 segment which may reflect further reduced flow or interval occlusion. The right A2 segment is patent and supplied by the left ACA via the anterior communicating artery. The left intracranial ICA is widely patent. The left ACA and left MCA are patent without evidence of proximal branch occlusion or significant proximal stenosis. No aneurysm is identified. IMPRESSION: 1. Multiple small acute right MCA infarcts. 2. Chronic right MCA infarct. 3. 3.7 cm left anterior parafalcine meningioma, not significantly changed in size from 2019 and without edema. 4. 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 Severely diminished flow in the proximal right MCA and possible interval occlusion of the right A1 segment. Electronically Signed   By: Logan Bores M.D.   On: 12/23/2018 14:57   Mr Brain Wo Contrast  Result Date: 12/23/2018 CLINICAL DATA:  Left arm weakness. EXAM: MRI HEAD WITHOUT CONTRAST MRA HEAD WITHOUT CONTRAST TECHNIQUE: Multiplanar, multiecho pulse sequences of the brain and surrounding structures were obtained without intravenous contrast. Angiographic images of the head were obtained using MRA technique without contrast. COMPARISON:  Head CT 12/23/2018.  Head MRI and MRA 05/03/2018. FINDINGS: MRI HEAD FINDINGS Brain: There are multiple small foci of  restricted diffusion in the right MCA territory consistent with acute infarcts primarily involving cortex of the  right frontal and parietal lobes. A chronic right MCA infarct is again noted involving the insula, basal ganglia, and frontal lobe with ex vacuo dilatation of the right frontal horn and hemosiderin staining. Scattered T2 hyperintensities elsewhere in the cerebral white matter bilaterally are similar to the prior MRI and nonspecific but compatible with mild chronic small vessel ischemic disease. An extra-axial mass anteriorly along the falx on the left has not significantly changed in size from the prior MRI and measures 2.2 x 2.8 x 3.7 cm. There is local mass effect on the left frontal lobe without edema. Mild generalized cerebral atrophy is not greater than expected for age. Vascular: Chronic right ICA occlusion. Skull and upper cervical spine: Unremarkable bone marrow signal. Sinuses/Orbits: Bilateral cataract extraction. Paranasal sinuses and mastoid air cells are clear. Other: None. MRA HEAD FINDINGS The visualized distal vertebral arteries are patent to the basilar with the left being dominant. The basilar artery is patent with minimal narrowing distally. There is a large left posterior communicating artery. Severe bilateral P2 stenoses are again noted. There is chronic occlusion of the right ICA with distal reconstitution near the terminus. The right M1 and proximal M2 segments are poorly visualized, more so than on the prior MRA and suggesting severely reduced flow. There is some reconstitution of more robust flow related enhancement in the more distal right MCA branches. No significant flow related enhancement is apparent today in the right A1 segment which may reflect further reduced flow or interval occlusion. The right A2 segment is patent and supplied by the left ACA via the anterior communicating artery. The left intracranial ICA is widely patent. The left ACA and left MCA are patent without evidence of proximal branch occlusion or significant proximal stenosis. No aneurysm is identified. IMPRESSION: 1.  Multiple small acute right MCA infarcts. 2. Chronic right MCA infarct. 3. 3.7 cm left anterior parafalcine meningioma, not significantly changed in size from 2019 and without edema. 4. 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 Severely diminished flow in the proximal right MCA and possible interval occlusion of the right A1 segment. Electronically Signed   By: Logan Bores M.D.   On: 12/23/2018 14:57   US Carotid Bilateral (at [facility] Only)  Result Date: 12/23/2018 CLINICAL DATA:  Small acute right MCA territory infarcts. History of chronic right ICA occlusion EXAM: BILATERAL CAROTID DUPLEX ULTRASOUND TECHNIQUE: Pearline Cables scale imaging, color Doppler and duplex ultrasound were performed of bilateral carotid and vertebral arteries in the neck. COMPARISON:  12/23/2018 MRI FINDINGS: Criteria: Quantification of carotid stenosis is based on velocity parameters that correlate the residual internal carotid diameter with NASCET-based stenosis levels, using the diameter of the distal internal carotid lumen as the denominator for stenosis measurement. The following velocity measurements were obtained: RIGHT ICA: Occluded cm/sec CCA: 33/4 cm/sec ECA: 283 cm/sec LEFT ICA: 174/44 cm/sec CCA: 81/19 cm/sec SYSTOLIC ICA/CCA RATIO:  3.7 ECA: 114 cm/sec RIGHT CAROTID ARTERY: Extensive right carotid atherosclerosis. CCA and ECA remain patent. Chronic occlusion of the right ICA as before. RIGHT VERTEBRAL ARTERY:  Antegrade LEFT CAROTID ARTERY: Moderate heterogeneous calcified atherosclerosis. Slight velocity elevation measuring 174/44 centimeters/second. Mild turbulent flow and spectral broadening. Left ICA stenosis estimated at 50-69% by ultrasound criteria. LEFT VERTEBRAL ARTERY:  Antegrade IMPRESSION: Chronically occluded right ICA as before. Moderate left ICA atherosclerosis. Left ICA stenosis estimated at 50-69% Patent antegrade vertebral flow bilaterally  Electronically Signed   By: Jerilynn Mages.  Shick  M.D.   On: 12/23/2018 15:28    Procedures Procedures (including critical care time)  CRITICAL CARE Performed by: Virgel Manifold Total critical care time: 40 minutes Critical care time was exclusive of separately billable procedures and treating other patients. Critical care was necessary to treat or prevent imminent or life-threatening deterioration. Critical care was time spent personally by me on the following activities: development of treatment plan with patient and/or surrogate as well as nursing, discussions with consultants, evaluation of patient's response to treatment, examination of patient, obtaining history from patient or surrogate, ordering and performing treatments and interventions, ordering and review of laboratory studies, ordering and review of radiographic studies, pulse oximetry and re-evaluation of patient's condition.   Medications Ordered in ED Medications - No data to display   Initial Impression / Assessment and Plan / ED Course  I have reviewed the triage vital signs and the nursing notes.  Pertinent labs & imaging results that were available during my care of the patient were reviewed by me and considered in my medical decision making (see chart for details).  Clinical Course as of Dec 24 720  Thu Dec 23, 2018  1258 CT Head Wo Contrast [WF]    Clinical Course User Index [WF] Jerel Shepherd        [id number] with LUE weakness. No pain. Onset 3-4 days ago. Presented outside of the window for acute intervention. Will start with CT. Basic labs and EKG. Anticipate MRI and admit.   Final Clinical Impressions(s) / ED Diagnoses   Final diagnoses:  Cerebrovascular accident (CVA), unspecified mechanism [facility])    ED Discharge Orders    None       Virgel Manifold, MD 12/25/18 [phone]

## 2018-12-23 NOTE — ED Notes (Signed)
Patient transported to CT 

## 2018-12-23 NOTE — H&P (Signed)
History and Physical    Danielle [name] XHB:[id number] DOB: 02-Nov-1931 DOA: 12/23/2018  Referring MD/NP/PA: Dr. Wilson Singer PCP: Sinda Du, MD  Patient coming from: Home  Chief Complaint: Left upper extremity weakness  HPI: Danielle [name] is a 85 y.o. female with a past medical history significant for hypertension, hyperlipidemia, hypothyroidism, type 2 diabetes mellitus insulin-dependent, prior history of stroke and meningioma; who presented to the emergency department secondary to left upper extremity weakness.  Symptoms presented approximately 2-3 days prior to admission and has continued progressing to the point of being unable to move her arm without assistance.  Patient is also experiencing difficulty opening her hands or holding anything.  She denies any chest pain, fever, chills, headaches, blurred vision, dysuria, hematuria, melena, nausea/vomiting, hematochezia or any other focal neurologic deficit. In the ED work-up included a CT scan of her head that demonstrated no acute hemorrhagic or new ischemic process.  Blood work and vital signs stable.  [facility] has been contacted to admit patient for further evaluation and management of TIA/stroke.  Past Medical/Surgical History: Past Medical History:  Diagnosis Date   Arthritis    "some joint pain once in awhile" (05/26/2017)   History of kidney stones    Hyperlipemia    Hypertension    Hypothyroidism (acquired)    Pneumothorax 02/05/2017   right-after fall   Rib fractures 02/05/2017   right side   Type II diabetes mellitus ([place])     Past Surgical History:  Procedure Laterality Date   AUGMENTATION MAMMAPLASTY Bilateral    DILATION AND CURETTAGE OF UTERUS     FRACTURE SURGERY     REVERSE SHOULDER ARTHROPLASTY Right 05/26/2017   REVERSE SHOULDER ARTHROPLASTY Right 05/26/2017   Procedure: REVERSE RIGHT SHOULDER ARTHROPLASTY;  Surgeon: Meredith Pel, MD;  Location: [place];  Service: Orthopedics;  Laterality:  Right;   TONSILLECTOMY     TUBAL LIGATION      Social History:  reports that she quit smoking about 29 years ago. Her smoking use included cigarettes. She has a 40.00 pack-year smoking history. She has never used smokeless tobacco. She reports that she does not drink alcohol or use drugs.  Allergies: Allergies  Allergen Reactions   Fosamax [Alendronate Sodium] Other (See Comments)    MUSCLE ACHES   Prednisone     Feels jittery and nauseous    Family History:  Per patient family history significant only for diabetes and hypertension.  Prior to Admission medications   Medication Sig Start Date End Date Taking? Authorizing Provider  aspirin EC 81 MG tablet Take 162 mg by mouth every morning.    Yes [provider]  atorvastatin (LIPITOR) 40 MG tablet Take 1 tablet (40 mg total) by mouth daily at 6 PM. 05/04/18  Yes Patrecia Pour, MD  Calcium Carb-Cholecalciferol (CALCIUM 600 + D PO) Take 1 tablet by mouth daily.   Yes [provider]  guaiFENesin (MUCINEX) 600 MG 12 hr tablet Take 2 tablets (1,200 mg total) by mouth 2 (two) times daily. Patient taking differently: Take 600 mg by mouth daily.  06/13/18  Yes Sinda Du, MD  insulin lispro (HUMALOG) 100 UNIT/ML injection Inject 12 Units into the skin once.    Yes [provider]  Multiple Vitamin (MULTIVITAMIN WITH MINERALS) TABS Take 1 tablet by mouth daily.    Yes [provider]  multivitamin-lutein (OCUVITE-LUTEIN) CAPS capsule Take 1 capsule by mouth daily.   Yes [provider]  Omega-3 Fatty Acids (FISH OIL) 1000 MG  CAPS Take 1,000 mg by mouth daily.   Yes [provider]  SYNTHROID 100 MCG tablet Take 100 mcg by mouth every morning.  08/21/18  Yes [provider]  TOUJEO SOLOSTAR 300 UNIT/ML SOPN Inject 20-22 Units into the skin every morning.  09/07/18  Yes [provider]  vitamin B-12 (CYANOCOBALAMIN) 1000 MCG tablet Take 1,000 mcg by mouth daily.   Yes  [provider]    Review of Systems:  Negative except as otherwise mentioned in HPI.  Physical Exam: Vitals:   12/23/18 1228 12/23/18 1505 12/23/18 1530 12/23/18 1600  BP:  (!) 180/117 (!) 173/68 (!) 179/69  Pulse:  70 70   Resp:  16 17 13   Temp:      TempSrc:      SpO2: 97% 96% 99%   Weight:      Height:        Constitutional: In no acute distress; oriented x3 and following commands appropriately.  No blurred vision reported and no dysarthria appreciated.  Patient denies chest pain, shortness of breath, and fever.  Left upper extremities weakness and inability to maintain against gravity appreciated. Eyes: PERRL, lids and conjunctivae normal, no icterus. ENMT: Mucous membranes are moist. Posterior pharynx clear of any exudate or lesions. Neck: normal, supple, no masses, no thyromegaly, no JVD. Respiratory: clear to auscultation bilaterally, no wheezing, no crackles. Normal respiratory effort. No accessory muscle use.  Cardiovascular: Regular rate and rhythm, positive systolic murmur, no rubs, no gallops, no carotid bruits.  Patient with pedal edema appreciated on exam but no leg swelling. Abdomen: no tenderness, no masses palpated. No hepatosplenomegaly. Bowel sounds positive.  Musculoskeletal: no clubbing / cyanosis. No joint deformity upper and lower extremities. No contractures. Normal muscle tone.  Skin: no rashes, lesions, ulcers. No induration Neurologic: CN 2-12 grossly intact.  Left upper extremity with a muscle strength of 2 out of 5, inability to maintain limb against gravity, open her hands are command or perform fine motor skills.  Patient is able to feel light touch sensation and if her hand is open able to squeeze fingers. Psychiatric: Normal judgment and insight. Alert and oriented x 3. Normal mood.    Labs on Admission: I have personally reviewed the following labs and imaging studies  CBC: Recent Labs  Lab 12/23/18 1037  WBC 6.5  NEUTROABS 3.7  HGB  12.1  HCT 37.1  MCV 92.3  PLT 932   Basic Metabolic Panel: Recent Labs  Lab 12/23/18 1037  NA 137  K 3.9  CL 110  CO2 21*  GLUCOSE 329*  BUN 21  CREATININE 0.86  CALCIUM 8.5*   GFR: Estimated Creatinine Clearance: 43.8 mL/min (by C-G formula based on SCr of 0.86 mg/dL).  Thyroid Function Tests: Recent Labs    12/23/18 1306  TSH 0.042*   Urine analysis:    Component Value Date/Time   COLORURINE YELLOW 09/27/2018 1550   APPEARANCEUR CLOUDY (A) 09/27/2018 1550   LABSPEC 1.024 09/27/2018 1550   PHURINE 5.0 09/27/2018 1550   GLUCOSEU >=500 (A) 09/27/2018 1550   HGBUR NEGATIVE 09/27/2018 1550   BILIRUBINUR NEGATIVE 09/27/2018 [place] 09/27/2018 1550   PROTEINUR NEGATIVE 09/27/2018 1550   NITRITE NEGATIVE 09/27/2018 1550   LEUKOCYTESUR SMALL (A) 09/27/2018 1550   Radiological Exams on Admission: Ct Head Wo Contrast  Result Date: 12/23/2018 CLINICAL DATA:  Left upper extremity weakness EXAM: CT HEAD WITHOUT CONTRAST TECHNIQUE: Contiguous axial images were obtained from the base of the skull through  the vertex without intravenous contrast. COMPARISON:  09/27/2018 FINDINGS: Brain: Redemonstration of a mass arising from the anterior falx on the left measuring approximately 2.6 x 2.8 cm, stable in size compared to prior. Internal focus of low-density likely internal cystic change, also stable from prior. Mass results in mild mass effect on the anterior horn of the left lateral ventricle. No appreciable surrounding edema. Encephalomalacia in the right frontal lobe from prior right MCA distribution infarct. Additional areas of encephalomalacia in the right basal ganglia from prior infarcts, also unchanged. No evidence of new infarct, hemorrhage, or extra-axial fluid collection. Scattered low-density changes within the periventricular and subcortical white matter compatible with chronic microvascular ischemic change. Ventricular size and configuration is unchanged.  Vascular: Atherosclerotic calcifications involving the large vessels of the skull base. No unexpected hyperdense vessel. Skull: Normal. Negative for fracture or focal lesion. Sinuses/Orbits: No acute finding. Other: None. IMPRESSION: 1. No evidence of an acute intracranial abnormality. 2. Stable size of left anterior parafalcine meningioma. 3. Chronic microvascular ischemic changes as well as prior right MCA distribution infarct and prior right basal ganglia lacunar infarcts. Electronically Signed   By: Davina Poke M.D.   On: 12/23/2018 12:02   Mr Angio Head Wo Contrast  Result Date: 12/23/2018 CLINICAL DATA:  Left arm weakness. EXAM: MRI HEAD WITHOUT CONTRAST MRA HEAD WITHOUT CONTRAST TECHNIQUE: Multiplanar, multiecho pulse sequences of the brain and surrounding structures were obtained without intravenous contrast. Angiographic images of the head were obtained using MRA technique without contrast. COMPARISON:  Head CT 12/23/2018.  Head MRI and MRA 05/03/2018. FINDINGS: MRI HEAD FINDINGS Brain: There are multiple small foci of restricted diffusion in the right MCA territory consistent with acute infarcts primarily involving cortex of the right frontal and parietal lobes. A chronic right MCA infarct is again noted involving the insula, basal ganglia, and frontal lobe with ex vacuo dilatation of the right frontal horn and hemosiderin staining. Scattered T2 hyperintensities elsewhere in the cerebral white matter bilaterally are similar to the prior MRI and nonspecific but compatible with mild chronic small vessel ischemic disease. An extra-axial mass anteriorly along the falx on the left has not significantly changed in size from the prior MRI and measures 2.2 x 2.8 x 3.7 cm. There is local mass effect on the left frontal lobe without edema. Mild generalized cerebral atrophy is not greater than expected for age. Vascular: Chronic right ICA occlusion. Skull and upper cervical spine: Unremarkable bone marrow  signal. Sinuses/Orbits: Bilateral cataract extraction. Paranasal sinuses and mastoid air cells are clear. Other: None. MRA HEAD FINDINGS The visualized distal vertebral arteries are patent to the basilar with the left being dominant. The basilar artery is patent with minimal narrowing distally. There is a large left posterior communicating artery. Severe bilateral P2 stenoses are again noted. There is chronic occlusion of the right ICA with distal reconstitution near the terminus. The right M1 and proximal M2 segments are poorly visualized, more so than on the prior MRA and suggesting severely reduced flow. There is some reconstitution of more robust flow related enhancement in the more distal right MCA branches. No significant flow related enhancement is apparent today in the right A1 segment which may reflect further reduced flow or interval occlusion. The right A2 segment is patent and supplied by the left ACA via the anterior communicating artery. The left intracranial ICA is widely patent. The left ACA and left MCA are patent without evidence of proximal branch occlusion or significant proximal stenosis. No aneurysm is identified. IMPRESSION:  1. Multiple small acute right MCA infarcts. 2. Chronic right MCA infarct. 3. 3.7 cm left anterior parafalcine meningioma, not significantly changed in size from 2019 and without edema. 4. 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 Severely diminished flow in the proximal right MCA and possible interval occlusion of the right A1 segment. Electronically Signed   By: Logan Bores M.D.   On: 12/23/2018 14:57   Mr Brain Wo Contrast  Result Date: 12/23/2018 CLINICAL DATA:  Left arm weakness. EXAM: MRI HEAD WITHOUT CONTRAST MRA HEAD WITHOUT CONTRAST TECHNIQUE: Multiplanar, multiecho pulse sequences of the brain and surrounding structures were obtained without intravenous contrast. Angiographic images of the head were obtained using MRA  technique without contrast. COMPARISON:  Head CT 12/23/2018.  Head MRI and MRA 05/03/2018. FINDINGS: MRI HEAD FINDINGS Brain: There are multiple small foci of restricted diffusion in the right MCA territory consistent with acute infarcts primarily involving cortex of the right frontal and parietal lobes. A chronic right MCA infarct is again noted involving the insula, basal ganglia, and frontal lobe with ex vacuo dilatation of the right frontal horn and hemosiderin staining. Scattered T2 hyperintensities elsewhere in the cerebral white matter bilaterally are similar to the prior MRI and nonspecific but compatible with mild chronic small vessel ischemic disease. An extra-axial mass anteriorly along the falx on the left has not significantly changed in size from the prior MRI and measures 2.2 x 2.8 x 3.7 cm. There is local mass effect on the left frontal lobe without edema. Mild generalized cerebral atrophy is not greater than expected for age. Vascular: Chronic right ICA occlusion. Skull and upper cervical spine: Unremarkable bone marrow signal. Sinuses/Orbits: Bilateral cataract extraction. Paranasal sinuses and mastoid air cells are clear. Other: None. MRA HEAD FINDINGS The visualized distal vertebral arteries are patent to the basilar with the left being dominant. The basilar artery is patent with minimal narrowing distally. There is a large left posterior communicating artery. Severe bilateral P2 stenoses are again noted. There is chronic occlusion of the right ICA with distal reconstitution near the terminus. The right M1 and proximal M2 segments are poorly visualized, more so than on the prior MRA and suggesting severely reduced flow. There is some reconstitution of more robust flow related enhancement in the more distal right MCA branches. No significant flow related enhancement is apparent today in the right A1 segment which may reflect further reduced flow or interval occlusion. The right A2 segment is  patent and supplied by the left ACA via the anterior communicating artery. The left intracranial ICA is widely patent. The left ACA and left MCA are patent without evidence of proximal branch occlusion or significant proximal stenosis. No aneurysm is identified. IMPRESSION: 1. Multiple small acute right MCA infarcts. 2. Chronic right MCA infarct. 3. 3.7 cm left anterior parafalcine meningioma, not significantly changed in size from 2019 and without edema. 4. 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 Severely diminished flow in the proximal right MCA and possible interval occlusion of the right A1 segment. Electronically Signed   By: Logan Bores M.D.   On: 12/23/2018 14:57   US Carotid Bilateral (at [facility] Only)  Result Date: 12/23/2018 CLINICAL DATA:  Small acute right MCA territory infarcts. History of chronic right ICA occlusion EXAM: BILATERAL CAROTID DUPLEX ULTRASOUND TECHNIQUE: Pearline Cables scale imaging, color Doppler and duplex ultrasound were performed of bilateral carotid and vertebral arteries in the neck. COMPARISON:  12/23/2018 MRI  FINDINGS: Criteria: Quantification of carotid stenosis is based on velocity parameters that correlate the residual internal carotid diameter with NASCET-based stenosis levels, using the diameter of the distal internal carotid lumen as the denominator for stenosis measurement. The following velocity measurements were obtained: RIGHT ICA: Occluded cm/sec CCA: 33/4 cm/sec ECA: 283 cm/sec LEFT ICA: 174/44 cm/sec CCA: 75/91 cm/sec SYSTOLIC ICA/CCA RATIO:  3.7 ECA: 114 cm/sec RIGHT CAROTID ARTERY: Extensive right carotid atherosclerosis. CCA and ECA remain patent. Chronic occlusion of the right ICA as before. RIGHT VERTEBRAL ARTERY:  Antegrade LEFT CAROTID ARTERY: Moderate heterogeneous calcified atherosclerosis. Slight velocity elevation measuring 174/44 centimeters/second. Mild turbulent flow and spectral broadening. Left ICA stenosis estimated  at 50-69% by ultrasound criteria. LEFT VERTEBRAL ARTERY:  Antegrade IMPRESSION: Chronically occluded right ICA as before. Moderate left ICA atherosclerosis. Left ICA stenosis estimated at 50-69% Patent antegrade vertebral flow bilaterally Electronically Signed   By: Jerilynn Mages.  Shick M.D.   On: 12/23/2018 15:28    EKG: Independently reviewed.  No acute ischemic changes seen; left bundle branch block appreciated.  Sinus rhythm.  Assessment/Plan 1-left upper extremity weakness: With concerns for TIA/stroke -Symptoms present for 2-3 days prior to admission and lack any improvement. -CT scan of the head negative for acute hemorrhagic or ischemic changes. -Chronic meningioma appreciated without significant changes in size or surrounding edema. -Patient with prior history of ischemic infarcts affecting right MCA distribution -Patient was actively taking 162 mg of aspirin for secondary prevention. -Patient will be admitted to telemetry bed, will check carotid Dopplers, MRI/MRA, TSH, A1c and lipid profile -Neurology service has been consulted -Continue the use of statins and full dose aspirin at this time -PT/OT and speech therapy consulted as per TIA/stroke protocol.  2-hyperlipidemia -Continue Lipitor and Lovaza -Check lipid panel  3-hypothyroidism -Check TSH -Continue Synthroid   4-history of B12 deficiency -continue B12 supplementation  5-insulin-dependent type 2 diabetes -Will check A1c -Continue adjusted dose of Lantus -Sliding scale insulin and follow CBGs for further adjustment on hypoglycemic regimen.    DVT prophylaxis: Heparin Code Status: Full code Family Communication: No family at bedside. Disposition Plan: To be determined; but will anticipate discharge back home once TIA/stroke work-up completed. Consults called: Neurology service has been consulted. Admission status: Observation, length of stay less than 2 midnights; telemetry bed.   Time Spent: 65 minutes.  Barton Dubois MD [facility] Pager [phone]   12/23/2018, 4:22 PM

## 2018-12-23 NOTE — ED Notes (Signed)
Patient transported to MRI 

## 2018-12-24 ENCOUNTER — Inpatient Hospital Stay (HOSPITAL_COMMUNITY)
Admit: 2018-12-24 | Discharge: 2018-12-24 | Disposition: A | Discharge status: Home / Self Care | Payer: Medicare Other | Attending: Pulmonary Disease | Admitting: Pulmonary Disease

## 2018-12-24 ENCOUNTER — Encounter (HOSPITAL_COMMUNITY): Payer: Self-pay

## 2018-12-24 ENCOUNTER — Inpatient Hospital Stay (HOSPITAL_COMMUNITY): Payer: Medicare Other

## 2018-12-24 ENCOUNTER — Other Ambulatory Visit: Payer: Self-pay

## 2018-12-24 DIAGNOSIS — Z79890 Hormone replacement therapy (postmenopausal): Secondary | ICD-10-CM | POA: Diagnosis not present

## 2018-12-24 DIAGNOSIS — I639 Cerebral infarction, unspecified: Secondary | ICD-10-CM | POA: Diagnosis present

## 2018-12-24 DIAGNOSIS — E1165 Type 2 diabetes mellitus with hyperglycemia: Secondary | ICD-10-CM | POA: Diagnosis present

## 2018-12-24 DIAGNOSIS — Z96611 Presence of right artificial shoulder joint: Secondary | ICD-10-CM | POA: Diagnosis present

## 2018-12-24 DIAGNOSIS — Z79899 Other long term (current) drug therapy: Secondary | ICD-10-CM | POA: Diagnosis not present

## 2018-12-24 DIAGNOSIS — Z96612 Presence of left artificial shoulder joint: Secondary | ICD-10-CM | POA: Diagnosis present

## 2018-12-24 DIAGNOSIS — Z8249 Family history of ischemic heart disease and other diseases of the circulatory system: Secondary | ICD-10-CM | POA: Diagnosis not present

## 2018-12-24 DIAGNOSIS — Z8673 Personal history of transient ischemic attack (TIA), and cerebral infarction without residual deficits: Secondary | ICD-10-CM | POA: Diagnosis not present

## 2018-12-24 DIAGNOSIS — I361 Nonrheumatic tricuspid (valve) insufficiency: Secondary | ICD-10-CM

## 2018-12-24 DIAGNOSIS — I351 Nonrheumatic aortic (valve) insufficiency: Secondary | ICD-10-CM

## 2018-12-24 DIAGNOSIS — Z794 Long term (current) use of insulin: Secondary | ICD-10-CM | POA: Diagnosis not present

## 2018-12-24 DIAGNOSIS — E785 Hyperlipidemia, unspecified: Secondary | ICD-10-CM | POA: Diagnosis present

## 2018-12-24 DIAGNOSIS — E1121 Type 2 diabetes mellitus with diabetic nephropathy: Secondary | ICD-10-CM | POA: Diagnosis present

## 2018-12-24 DIAGNOSIS — R29706 NIHSS score 6: Secondary | ICD-10-CM | POA: Diagnosis present

## 2018-12-24 DIAGNOSIS — R410 Disorientation, unspecified: Secondary | ICD-10-CM | POA: Diagnosis not present

## 2018-12-24 DIAGNOSIS — G8324 Monoplegia of upper limb affecting left nondominant side: Secondary | ICD-10-CM | POA: Diagnosis present

## 2018-12-24 DIAGNOSIS — E538 Deficiency of other specified B group vitamins: Secondary | ICD-10-CM | POA: Diagnosis present

## 2018-12-24 DIAGNOSIS — I63511 Cerebral infarction due to unspecified occlusion or stenosis of right middle cerebral artery: Secondary | ICD-10-CM | POA: Diagnosis present

## 2018-12-24 DIAGNOSIS — Z20828 Contact with and (suspected) exposure to other viral communicable diseases: Secondary | ICD-10-CM | POA: Diagnosis present

## 2018-12-24 DIAGNOSIS — Z87891 Personal history of nicotine dependence: Secondary | ICD-10-CM | POA: Diagnosis not present

## 2018-12-24 DIAGNOSIS — I10 Essential (primary) hypertension: Secondary | ICD-10-CM | POA: Diagnosis present

## 2018-12-24 DIAGNOSIS — E039 Hypothyroidism, unspecified: Secondary | ICD-10-CM | POA: Diagnosis present

## 2018-12-24 DIAGNOSIS — J449 Chronic obstructive pulmonary disease, unspecified: Secondary | ICD-10-CM | POA: Diagnosis present

## 2018-12-24 DIAGNOSIS — Z7982 Long term (current) use of aspirin: Secondary | ICD-10-CM | POA: Diagnosis not present

## 2018-12-24 DIAGNOSIS — Z888 Allergy status to other drugs, medicaments and biological substances status: Secondary | ICD-10-CM | POA: Diagnosis not present

## 2018-12-24 DIAGNOSIS — D320 Benign neoplasm of cerebral meninges: Secondary | ICD-10-CM | POA: Diagnosis present

## 2018-12-24 DIAGNOSIS — I447 Left bundle-branch block, unspecified: Secondary | ICD-10-CM | POA: Diagnosis present

## 2018-12-24 LAB — GLUCOSE, CAPILLARY
Glucose-Capillary: 123 mg/dL — ABNORMAL HIGH (ref 70–99)
Glucose-Capillary: 238 mg/dL — ABNORMAL HIGH (ref 70–99)
Glucose-Capillary: 289 mg/dL — ABNORMAL HIGH (ref 70–99)
Glucose-Capillary: 153 mg/dL — ABNORMAL HIGH (ref 70–99)
Glucose-Capillary: 184 mg/dL — ABNORMAL HIGH (ref 70–99)

## 2018-12-24 LAB — LIPID PANEL
Cholesterol: 171 mg/dL (ref 0–200)
HDL: 59 mg/dL (ref >40)
LDL Cholesterol: 97 mg/dL (ref 0–99)
Total CHOL/HDL Ratio: 2.9 RATIO
Triglycerides: 75 mg/dL (ref <150)
VLDL: 15 mg/dL (ref 0–40)

## 2018-12-24 LAB — BLOOD GAS, ARTERIAL
Acid-Base Excess: 0.5 mmol/L (ref 0.0–2.0)
Bicarbonate: 25.2 mmol/L (ref 20.0–28.0)
FIO2: 21
O2 Saturation: 96.4 %
Patient temperature: 37.2
pCO2 arterial: 34.9 mmHg (ref 32.0–48.0)
pH, Arterial: 7.453 — ABNORMAL HIGH (ref 7.350–7.450)
pO2, Arterial: 85 mmHg (ref 83.0–108.0)

## 2018-12-24 LAB — ECHOCARDIOGRAM COMPLETE
Height: 65 in
Weight: 2080 oz

## 2018-12-24 LAB — HEMOGLOBIN A1C
Hgb A1c MFr Bld: 10.3 % — ABNORMAL HIGH (ref 4.8–5.6)
Mean Plasma Glucose: 248.91 mg/dL

## 2018-12-24 LAB — CBG MONITORING, ED: Glucose-Capillary: 133 mg/dL — ABNORMAL HIGH (ref 70–99)

## 2018-12-24 MED ORDER — CIPROFLOXACIN HCL 250 MG PO TABS
250.0000 mg | ORAL_TABLET | Freq: Two times a day (BID) | ORAL | Status: DC
  Administered 2018-12-24 – 2018-12-26 (×5): 250 mg via ORAL
  Filled 2018-12-24 (×6): qty 1

## 2018-12-24 NOTE — Plan of Care (Signed)
  Problem: Education: Goal: Knowledge of General Education information will improve Description: Including pain rating scale, medication(s)/side effects and non-pharmacologic comfort measures Outcome: Progressing   Problem: Clinical Measurements: Goal: Ability to maintain clinical measurements within normal limits will improve Outcome: Progressing Goal: Will remain free from infection Outcome: Progressing Goal: Diagnostic test results will improve Outcome: Progressing Goal: Respiratory complications will improve Outcome: Progressing Goal: Cardiovascular complication will be avoided Outcome: Progressing   Problem: Activity: Goal: Risk for activity intolerance will decrease Outcome: Progressing   Problem: Pain Managment: Goal: General experience of comfort will improve Outcome: Progressing   Problem: Safety: Goal: Ability to remain free from injury will improve Outcome: Progressing   Problem: Skin Integrity: Goal: Risk for impaired skin integrity will decrease Outcome: Progressing   

## 2018-12-24 NOTE — Progress Notes (Signed)
Echocardiogram completed, echo tech stated initially pt was resistive to touch or turning. Upon assessment at this time, pt is quiet but making comments about leaving the hospital and "all yall people need to leave my room". Pt recognizes husband, but still does not know she is in the hospital. Allowed RT to draw ABG. Pt  is more noticably confused from earlier today. Awaiting ABG results to call to MD. Pt felt warm, temp check was 99.1 oral. No SOB, chest CTA.

## 2018-12-24 NOTE — Progress Notes (Addendum)
Pt's husband states patient is acting differently. More sleepy and less alert. Upon exam, pt left lying in bed with covers pulled up to chin. Eyes closed, skin warm and dry and pink. Pt answers to name called, can state name and date of birth but cannot tell me where she is. She can tell me the year and the president, but not why she is in the hospital, which is a change from earlier today. When asked to open eyes, pt barely opens and cannot keep eyes open for long. States, "I'm sleepy". Denies any pain. No major change in motor function. Bloodsugar checked with results of 289mg /dl. Face symmetrical, pupils equal, round and reactive. Pt seems more irritable than earlier. Dr. Luan Pulling paged and returned call with order for STAT ABG and call results. Order entered and RT paged.  Corrected bloodsugar result (CSR)

## 2018-12-24 NOTE — Evaluation (Signed)
Physical Therapy Evaluation Patient Details Name: Danielle [name] MRN: [id number] DOB: 06/13/1931 Today's Date: 12/24/2018   History of Present Illness  Danielle [name] is a 85 y.o. female with a past medical history significant for hypertension, hyperlipidemia, hypothyroidism, type 2 diabetes mellitus insulin-dependent, prior history of stroke and meningioma; who presented to the emergency department secondary to left upper extremity weakness.  Symptoms presented approximately 2-3 days prior to admission and has continued progressing to the point of being unable to move her arm without assistance.  Patient is also experiencing difficulty opening her hands or holding anything.  She denies any chest pain, fever, chills, headaches, blurred vision, dysuria, hematuria, melena, nausea/vomiting, hematochezia or any other focal neurologic deficit.    Clinical Impression  Patient unable to use LUE for bed mobility due to weakness and pain, had difficulty scooting forward, frequent leaning to the right while seated at bedside, unsteady on feet with occasional near loss of balance especially when transferring to commode in bathroom, limited for ambulation due to fatigue with occasional leaning on nearby objects for support while using quad cane and tolerated sitting up in chair after therapy  - NT notified.  Patient will benefit from continued physical therapy in hospital and recommended venue below to increase strength, balance, endurance for safe ADLs and gait.    Follow Up Recommendations SNF;Supervision for mobility/OOB;Supervision - Intermittent    Equipment Recommendations  None recommended by PT    Recommendations for Other Services       Precautions / Restrictions Precautions Precautions: Fall Restrictions Weight Bearing Restrictions: No      Mobility  Bed Mobility Overal bed mobility: Needs Assistance Bed Mobility: Supine to Sit     Supine to sit: Min assist;Mod assist      General bed mobility comments: labored movement, unable to use LUE to help with bed mobility  Transfers Overall transfer level: Needs assistance Equipment used: Quad cane Transfers: Sit to/from [facility] Sit to Stand: Min assist Stand pivot transfers: Min assist;Mod assist       General transfer comment: slow labored movement, required frequent verbal/tactile cueing for proper use of quad-cane  Ambulation/Gait Ambulation/Gait assistance: Min assist;Mod assist Gait Distance (Feet): 40 Feet Assistive device: Quad cane Gait Pattern/deviations: Step-to pattern;Decreased step length - right;Decreased step length - left;Decreased stride length;Staggering right Gait velocity: slow   General Gait Details: unsteady slow labored cadence with frequent drifting to the right, fatigues easily and attempts to lean on nearby objects for support  Stairs            Wheelchair Mobility    Modified Rankin (Stroke Patients Only)       Balance Overall balance assessment: Needs assistance Sitting-balance support: Feet supported;No upper extremity supported Sitting balance-Leahy Scale: Fair Sitting balance - Comments: seated at bedside, frequent leaning to the right Postural control: Right lateral lean Standing balance support: Single extremity supported;During functional activity Standing balance-Leahy Scale: Fair Standing balance comment: using quad cane                             Pertinent Vitals/Pain Pain Assessment: Faces Faces Pain Scale: Hurts even more Pain Location: LUE with pressure, movement Pain Descriptors / Indicators: Guarding;Grimacing;Sore Pain Intervention(s): Limited activity within patient's tolerance;Monitored during session    [place] expects to be discharged to:: Private residence Living Arrangements: Spouse/significant other Available Help at Discharge: Family;Available 24 hours/day Type of Home: House Home  Access: Stairs  to enter Entrance Stairs-Rails: Right Entrance Stairs-Number of Steps: 2 Home Layout: Two level;Able to live on main level with bedroom/bathroom Home Equipment: Gilford Rile - 2 wheels;Cane - single point;Bedside commode;Grab bars - tub/shower;Wheelchair - manual;Shower seat      Prior Function Level of Independence: Independent with assistive device(s)         Comments: household and short distanced community ambulator with tripod cane     Hand Dominance   Dominant Hand: Right    Extremity/Trunk Assessment   Upper Extremity Assessment Upper Extremity Assessment: Defer to OT evaluation    Lower Extremity Assessment Lower Extremity Assessment: Generalized weakness;RLE deficits/detail;LLE deficits/detail RLE Deficits / Details: grossly 5/5 LLE Deficits / Details: grossly 4/5    Cervical / Trunk Assessment Cervical / Trunk Assessment: Normal  Communication   Communication: No difficulties  Cognition Arousal/Alertness: Awake/alert Behavior During Therapy: WFL for tasks assessed/performed Overall Cognitive Status: Within Functional Limits for tasks assessed                                        General Comments      Exercises     Assessment/Plan    PT Assessment Patient needs continued PT services  PT Problem List Decreased strength;Decreased activity tolerance;Decreased balance;Decreased mobility       PT Treatment Interventions Gait training;Stair training;Functional mobility training;Therapeutic activities;Therapeutic exercise;Patient/family education    PT Goals (Current goals can be found in the Care Plan section)  Acute Rehab PT Goals Patient Stated Goal: return home with family to assist PT Goal Formulation: With patient Time For Goal Achievement: 01/07/19 Potential to Achieve Goals: Good    Frequency 7X/week   Barriers to discharge        Co-evaluation               AM-PAC PT "6 Clicks" Mobility  Outcome  Measure Help needed turning from your back to your side while in a flat bed without using bedrails?: A Lot Help needed moving from lying on your back to sitting on the side of a flat bed without using bedrails?: A Lot Help needed moving to and from a bed to a chair (including a wheelchair)?: A Lot Help needed standing up from a chair using your arms (e.g., wheelchair or bedside chair)?: A Lot Help needed to walk in hospital room?: A Lot Help needed climbing 3-5 steps with a railing? : Total 6 Click Score: 11    End of Session Equipment Utilized During Treatment: Gait belt Activity Tolerance: Patient tolerated treatment well;Patient limited by fatigue Patient left: in chair;with call bell/phone within reach Nurse Communication: Mobility status PT Visit Diagnosis: Unsteadiness on feet (R26.81);Other abnormalities of gait and mobility (R26.89);Muscle weakness (generalized) (M62.81)    Time: 5974-1638 PT Time Calculation (min) (ACUTE ONLY): 36 min   Charges:   PT Evaluation $PT Eval Moderate Complexity: 1 Mod PT Treatments $Therapeutic Activity: 23-37 mins        11:08 AM, 12/24/18 Lonell Grandchild, MPT Physical Therapist with [facility] [phone] office [phone] mobile phone

## 2018-12-24 NOTE — Progress Notes (Signed)
Dr. Luan Pulling advised of ABG results and given update on patient condition. At this time, pt is back to early am baseline. Alert and oriented x3, cooperative. VSS. Advised MD that husband is concerned over pink discharge noted on toilet tissue with urination x2 today. This nurse has not witnessed this pink discharge. MD states to hold sq heparin today, order ua & uc, eeg to rule out seizures and start oral antibiotics once ua/uc collected. Husband and patient updated on plans and agreeable.

## 2018-12-24 NOTE — Care Management (Signed)
    Quality of Patient Care RatingSelect to sort ascending or descending . A rating of 4 or more stars means the agency performed better than most other agencies on selected measures. . A rating of 3 to 3 stars means that the agency performed about the same as most agencies. . A rating of fewer than 3 stars means that the agency's performance was below the average of other agencies on selected measures."   [place] [phone]   [place] to my [place] out of 5 stars Patient Survey Summary Rating4 out of [place] [phone]   [place] to my Favorites Quality of Patient Care Rating3 out of 5 stars Patient Survey Summary Rating5 out of [place] [phone]   [place] to my Favorites Quality of Patient Care Rating3 out of 5 stars Patient Survey Summary Rating4 out of [place] [phone]   [place] to my [place]  out of 5 stars Patient Survey Summary Rating4 out of [place] [phone]   [place] to my [place]  out of 5 stars Patient Survey Summary [place] out of [place] [phone]   [place] to my Favorites Quality of Patient Care Rating4 out of 5 stars Patient Survey Summary Rating4 out of [place] [phone]   [place] to my Favorites Quality of Patient Care Rating4 out of 5 stars Patient Survey Summary Rating4 out of [place] [phone]   [place] to my Favorites Quality of Patient Care Rating4 out of 5 stars Patient Survey Summary Rating4 out of [place] AGE [phone]   [place] AGE to my Favorites Quality of Patient Care Rating3 out of 5 stars Patient Survey Summary Rating3 out of 5 stars  [facility] [place] [phone]   Add [facility] [place] to my Favorites Quality of Patient Care Rating3  out of 5 stars Patient Survey Summary Rating4 out of [place] [phone]   [place] to my Favorites Quality of Patient Care Rating3 out of 5 stars Patient Survey Summary Rating4 out of 5 stars  [facility] [phone]   Add [facility] to my Favorites Quality of Patient Care Rating3  out of 5 stars Patient Survey Summary Rating3 out of [place] [phone]   Add [place] to my Favorites Quality of Patient Care Rating3  out of 5 stars Not [place] [phone]

## 2018-12-24 NOTE — Progress Notes (Signed)
Subjective: She was admitted with what appears to be another stroke.  She has decreased use and numbness of her left arm.  MRI shows multiple areas.  Objective: Vital signs in last 24 hours: Temp:  [97.9 F (36.6 C)] 97.9 F (36.6 C) (08/06 0908) Pulse Rate:  [50-89] 89 (08/07 0700) Resp:  [13-23] 21 (08/07 0700) BP: (108-195)/(45-136) 174/82 (08/07 0700) SpO2:  [96 %-100 %] 96 % (08/07 0700) FiO2 (%):  [0 %] 0 % (08/06 1228) Weight:  [59 kg] 59 kg (08/06 0909) Weight change:     Intake/Output from previous day: 08/06 0701 - 08/07 0700 In: -  Out: 700 [Urine:700]  PHYSICAL EXAM General appearance: alert, cooperative and She is thin Resp: clear to auscultation bilaterally Cardio: regular rate and rhythm, S1, S2 normal, no murmur, click, rub or gallop GI: soft, non-tender; bowel sounds normal; no masses,  no organomegaly Extremities: extremities normal, atraumatic, no cyanosis or edema She has left arm weakness Lab Results:  Results for orders placed or performed during the hospital encounter of 12/23/18 (from the past 48 hour(s))  SARS CORONAVIRUS 2 Nasal Swab Aptima Multi Swab     Status: None   Collection Time: 12/23/18  9:57 AM   Specimen: Aptima Multi Swab; Nasal Swab  Result Value Ref Range   SARS Coronavirus 2 NEGATIVE NEGATIVE    Comment: (NOTE) SARS-CoV-2 target nucleic acids are NOT DETECTED. The SARS-CoV-2 RNA is generally detectable in upper and lower respiratory specimens during the acute phase of infection. Negative results do not preclude SARS-CoV-2 infection, do not rule out co-infections with other pathogens, and should not be used as the sole basis for treatment or other patient management decisions. Negative results must be combined with clinical observations, patient history, and epidemiological information. The expected result is Negative. Fact Sheet for Patients: SugarRoll.be Fact Sheet for Healthcare  Providers: https://www.woods-mathews.com/ This test is not yet approved or cleared by the [place] FDA and  has been authorized for detection and/or diagnosis of SARS-CoV-2 by FDA under an Emergency Use Authorization (EUA). This EUA will remain  in effect (meaning this test can be used) for the duration of the COVID-19 declaration under Section 56 4(b)(1) of the Act, 21 U.S.C. section 360bbb-3(b)(1), unless the authorization is terminated or revoked sooner. Performed at [facility], [place] [address], [place], [place] [address]   CBC with Differential     Status: None   Collection Time: 12/23/18 10:37 AM  Result Value Ref Range   WBC 6.5 4.0 - 10.5 K/uL   RBC 4.02 3.87 - 5.11 MIL/uL   Hemoglobin 12.1 12.0 - 15.0 g/dL   HCT 37.1 36.0 - 46.0 %   MCV 92.3 80.0 - 100.0 fL   MCH 30.1 26.0 - 34.0 pg   MCHC 32.6 30.0 - 36.0 g/dL   RDW 14.5 11.5 - 15.5 %   Platelets 281 150 - 400 K/uL   nRBC 0.0 0.0 - 0.2 %   Neutrophils Relative % 57 %   Neutro Abs 3.7 1.7 - 7.7 K/uL   Lymphocytes Relative 27 %   Lymphs Abs 1.7 0.7 - 4.0 K/uL   Monocytes Relative 11 %   Monocytes Absolute 0.7 0.1 - 1.0 K/uL   Eosinophils Relative 4 %   Eosinophils Absolute 0.3 0.0 - 0.5 K/uL   Basophils Relative 1 %   Basophils Absolute 0.1 0.0 - 0.1 K/uL   Immature Granulocytes 0 %   Abs Immature Granulocytes 0.01 0.00 - 0.07 K/uL  Comment: Performed at [facility], [address], [place], [place] [address]  Basic metabolic panel     Status: Abnormal   Collection Time: 12/23/18 10:37 AM  Result Value Ref Range   Sodium 137 135 - 145 mmol/L   Potassium 3.9 3.5 - 5.1 mmol/L   Chloride 110 98 - 111 mmol/L   CO2 21 (L) 22 - 32 mmol/L   Glucose, Bld 329 (H) 70 - 99 mg/dL   BUN 21 8 - 23 mg/dL   Creatinine, Ser 0.86 0.44 - 1.00 mg/dL   Calcium 8.5 (L) 8.9 - 10.3 mg/dL   GFR calc non Af Amer >60 >60 mL/min   GFR calc Af Amer >60 >60 mL/min   Anion gap 6 5 - 15    Comment: Performed at  [facility], [address], [place], [place] [address]  TSH     Status: Abnormal   Collection Time: 12/23/18  1:06 PM  Result Value Ref Range   TSH 0.042 (L) 0.350 - 4.500 uIU/mL    Comment: Performed by a 3rd Generation assay with a functional sensitivity of <=0.01 uIU/mL. Performed at [facility], [address] [place], [place] [address]   CBG monitoring, ED     Status: Abnormal   Collection Time: 12/23/18  5:27 PM  Result Value Ref Range   Glucose-Capillary 218 (H) 70 - 99 mg/dL  Lipid panel     Status: None   Collection Time: 12/24/18  4:43 AM  Result Value Ref Range   Cholesterol 171 0 - 200 mg/dL   Triglycerides 75 <150 mg/dL   HDL 59 >40 mg/dL   Total CHOL/HDL Ratio 2.9 RATIO   VLDL 15 0 - 40 mg/dL   LDL Cholesterol 97 0 - 99 mg/dL    Comment:        Total Cholesterol/HDL:CHD Risk Coronary Heart Disease Risk Table                     Men   Women  1/2 Average Risk   3.4   3.3  Average Risk       5.0   4.4  2 X Average Risk   9.6   7.1  3 X Average Risk  23.4   11.0        Use the calculated Patient Ratio above and the CHD Risk Table to determine the patient's CHD Risk.        ATP III CLASSIFICATION (LDL):  <100     mg/dL   Optimal  100-129  mg/dL   Near or Above                    Optimal  130-159  mg/dL   Borderline  160-189  mg/dL   High  >190     mg/dL   Very High Performed at [facility], [address] [place], [place] [address]   CBG monitoring, ED     Status: Abnormal   Collection Time: 12/24/18  6:20 AM  Result Value Ref Range   Glucose-Capillary 133 (H) 70 - 99 mg/dL  Glucose, capillary     Status: Abnormal   Collection Time: 12/24/18  8:20 AM  Result Value Ref Range   Glucose-Capillary 123 (H) 70 - 99 mg/dL    ABGS No results for input(s): PHART, PO2ART, TCO2, HCO3 in the last 72 hours.  Invalid input(s): PCO2 CULTURES Recent Results (from the past 240 hour(s))  SARS CORONAVIRUS 2 Nasal Swab Aptima  Multi Swab     Status: None    Collection Time: 12/23/18  9:57 AM   Specimen: Aptima Multi Swab; Nasal Swab  Result Value Ref Range Status   SARS Coronavirus 2 NEGATIVE NEGATIVE Final    Comment: (NOTE) SARS-CoV-2 target nucleic acids are NOT DETECTED. The SARS-CoV-2 RNA is generally detectable in upper and lower respiratory specimens during the acute phase of infection. Negative results do not preclude SARS-CoV-2 infection, do not rule out co-infections with other pathogens, and should not be used as the sole basis for treatment or other patient management decisions. Negative results must be combined with clinical observations, patient history, and epidemiological information. The expected result is Negative. Fact Sheet for Patients: SugarRoll.be Fact Sheet for Healthcare Providers: https://www.woods-mathews.com/ This test is not yet approved or cleared by the [place] FDA and  has been authorized for detection and/or diagnosis of SARS-CoV-2 by FDA under an Emergency Use Authorization (EUA). This EUA will remain  in effect (meaning this test can be used) for the duration of the COVID-19 declaration under Section 56 4(b)(1) of the Act, 21 U.S.C. section 360bbb-3(b)(1), unless the authorization is terminated or revoked sooner. Performed at [facility], [place] [address] [place], [place] [address]    Studies/Results: Ct Head Wo Contrast  Result Date: 12/23/2018 CLINICAL DATA:  Left upper extremity weakness EXAM: CT HEAD WITHOUT CONTRAST TECHNIQUE: Contiguous axial images were obtained from the base of the skull through the vertex without intravenous contrast. COMPARISON:  09/27/2018 FINDINGS: Brain: Redemonstration of a mass arising from the anterior falx on the left measuring approximately 2.6 x 2.8 cm, stable in size compared to prior. Internal focus of low-density likely internal cystic change, also stable from prior. Mass results in mild mass effect on the  anterior horn of the left lateral ventricle. No appreciable surrounding edema. Encephalomalacia in the right frontal lobe from prior right MCA distribution infarct. Additional areas of encephalomalacia in the right basal ganglia from prior infarcts, also unchanged. No evidence of new infarct, hemorrhage, or extra-axial fluid collection. Scattered low-density changes within the periventricular and subcortical white matter compatible with chronic microvascular ischemic change. Ventricular size and configuration is unchanged. Vascular: Atherosclerotic calcifications involving the large vessels of the skull base. No unexpected hyperdense vessel. Skull: Normal. Negative for fracture or focal lesion. Sinuses/Orbits: No acute finding. Other: None. IMPRESSION: 1. No evidence of an acute intracranial abnormality. 2. Stable size of left anterior parafalcine meningioma. 3. Chronic microvascular ischemic changes as well as prior right MCA distribution infarct and prior right basal ganglia lacunar infarcts. Electronically Signed   By: Davina Poke M.D.   On: 12/23/2018 12:02   Mr Angio Head Wo Contrast  Result Date: 12/23/2018 CLINICAL DATA:  Left arm weakness. EXAM: MRI HEAD WITHOUT CONTRAST MRA HEAD WITHOUT CONTRAST TECHNIQUE: Multiplanar, multiecho pulse sequences of the brain and surrounding structures were obtained without intravenous contrast. Angiographic images of the head were obtained using MRA technique without contrast. COMPARISON:  Head CT 12/23/2018.  Head MRI and MRA 05/03/2018. FINDINGS: MRI HEAD FINDINGS Brain: There are multiple small foci of restricted diffusion in the right MCA territory consistent with acute infarcts primarily involving cortex of the right frontal and parietal lobes. A chronic right MCA infarct is again noted involving the insula, basal ganglia, and frontal lobe with ex vacuo dilatation of the right frontal horn and hemosiderin staining. Scattered T2 hyperintensities elsewhere in the  cerebral white matter bilaterally are similar to the prior MRI and nonspecific but compatible  with mild chronic small vessel ischemic disease. An extra-axial mass anteriorly along the falx on the left has not significantly changed in size from the prior MRI and measures 2.2 x 2.8 x 3.7 cm. There is local mass effect on the left frontal lobe without edema. Mild generalized cerebral atrophy is not greater than expected for age. Vascular: Chronic right ICA occlusion. Skull and upper cervical spine: Unremarkable bone marrow signal. Sinuses/Orbits: Bilateral cataract extraction. Paranasal sinuses and mastoid air cells are clear. Other: None. MRA HEAD FINDINGS The visualized distal vertebral arteries are patent to the basilar with the left being dominant. The basilar artery is patent with minimal narrowing distally. There is a large left posterior communicating artery. Severe bilateral P2 stenoses are again noted. There is chronic occlusion of the right ICA with distal reconstitution near the terminus. The right M1 and proximal M2 segments are poorly visualized, more so than on the prior MRA and suggesting severely reduced flow. There is some reconstitution of more robust flow related enhancement in the more distal right MCA branches. No significant flow related enhancement is apparent today in the right A1 segment which may reflect further reduced flow or interval occlusion. The right A2 segment is patent and supplied by the left ACA via the anterior communicating artery. The left intracranial ICA is widely patent. The left ACA and left MCA are patent without evidence of proximal branch occlusion or significant proximal stenosis. No aneurysm is identified. IMPRESSION: 1. Multiple small acute right MCA infarcts. 2. Chronic right MCA infarct. 3. 3.7 cm left anterior parafalcine meningioma, not significantly changed in size from 2019 and without edema. 4. Chronic occlusion of the right ICA with reconstitution of the ICA  terminus. Severely diminished flow in the proximal right MCA and possible interval occlusion of the right A1 segment. Electronically Signed   By: Logan Bores M.D.   On: 12/23/2018 14:57   Mr Brain Wo Contrast  Result Date: 12/23/2018 CLINICAL DATA:  Left arm weakness. EXAM: MRI HEAD WITHOUT CONTRAST MRA HEAD WITHOUT CONTRAST TECHNIQUE: Multiplanar, multiecho pulse sequences of the brain and surrounding structures were obtained without intravenous contrast. Angiographic images of the head were obtained using MRA technique without contrast. COMPARISON:  Head CT 12/23/2018.  Head MRI and MRA 05/03/2018. FINDINGS: MRI HEAD FINDINGS Brain: There are multiple small foci of restricted diffusion in the right MCA territory consistent with acute infarcts primarily involving cortex of the right frontal and parietal lobes. A chronic right MCA infarct is again noted involving the insula, basal ganglia, and frontal lobe with ex vacuo dilatation of the right frontal horn and hemosiderin staining. Scattered T2 hyperintensities elsewhere in the cerebral white matter bilaterally are similar to the prior MRI and nonspecific but compatible with mild chronic small vessel ischemic disease. An extra-axial mass anteriorly along the falx on the left has not significantly changed in size from the prior MRI and measures 2.2 x 2.8 x 3.7 cm. There is local mass effect on the left frontal lobe without edema. Mild generalized cerebral atrophy is not greater than expected for age. Vascular: Chronic right ICA occlusion. Skull and upper cervical spine: Unremarkable bone marrow signal. Sinuses/Orbits: Bilateral cataract extraction. Paranasal sinuses and mastoid air cells are clear. Other: None. MRA HEAD FINDINGS The visualized distal vertebral arteries are patent to the basilar with the left being dominant. The basilar artery is patent with minimal narrowing distally. There is a large left posterior communicating artery. Severe bilateral P2  stenoses are again noted. There is chronic  occlusion of the right ICA with distal reconstitution near the terminus. The right M1 and proximal M2 segments are poorly visualized, more so than on the prior MRA and suggesting severely reduced flow. There is some reconstitution of more robust flow related enhancement in the more distal right MCA branches. No significant flow related enhancement is apparent today in the right A1 segment which may reflect further reduced flow or interval occlusion. The right A2 segment is patent and supplied by the left ACA via the anterior communicating artery. The left intracranial ICA is widely patent. The left ACA and left MCA are patent without evidence of proximal branch occlusion or significant proximal stenosis. No aneurysm is identified. IMPRESSION: 1. Multiple small acute right MCA infarcts. 2. Chronic right MCA infarct. 3. 3.7 cm left anterior parafalcine meningioma, not significantly changed in size from 2019 and without edema. 4. 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 Severely diminished flow in the proximal right MCA and possible interval occlusion of the right A1 segment. Electronically Signed   By: Logan Bores M.D.   On: 12/23/2018 14:57   US Carotid Bilateral (at [facility] Only)  Result Date: 12/23/2018 CLINICAL DATA:  Small acute right MCA territory infarcts. History of chronic right ICA occlusion EXAM: BILATERAL CAROTID DUPLEX ULTRASOUND TECHNIQUE: Pearline Cables scale imaging, color Doppler and duplex ultrasound were performed of bilateral carotid and vertebral arteries in the neck. COMPARISON:  12/23/2018 MRI FINDINGS: Criteria: Quantification of carotid stenosis is based on velocity parameters that correlate the residual internal carotid diameter with NASCET-based stenosis levels, using the diameter of the distal internal carotid lumen as the denominator for stenosis measurement. The following velocity measurements were obtained: RIGHT  ICA: Occluded cm/sec CCA: 33/4 cm/sec ECA: 283 cm/sec LEFT ICA: 174/44 cm/sec CCA: 24/26 cm/sec SYSTOLIC ICA/CCA RATIO:  3.7 ECA: 114 cm/sec RIGHT CAROTID ARTERY: Extensive right carotid atherosclerosis. CCA and ECA remain patent. Chronic occlusion of the right ICA as before. RIGHT VERTEBRAL ARTERY:  Antegrade LEFT CAROTID ARTERY: Moderate heterogeneous calcified atherosclerosis. Slight velocity elevation measuring 174/44 centimeters/second. Mild turbulent flow and spectral broadening. Left ICA stenosis estimated at 50-69% by ultrasound criteria. LEFT VERTEBRAL ARTERY:  Antegrade IMPRESSION: Chronically occluded right ICA as before. Moderate left ICA atherosclerosis. Left ICA stenosis estimated at 50-69% Patent antegrade vertebral flow bilaterally Electronically Signed   By: Jerilynn Mages.  Shick M.D.   On: 12/23/2018 15:28    Medications:  Prior to Admission:  Medications Prior to Admission  Medication Sig Dispense Refill Last Dose  . aspirin EC 81 MG tablet Take 162 mg by mouth every morning.    12/23/2018 at Unknown time  . atorvastatin (LIPITOR) 40 MG tablet Take 1 tablet (40 mg total) by mouth daily at 6 PM. 30 tablet 0 12/22/2018 at Unknown time  . Calcium Carb-Cholecalciferol (CALCIUM 600 + D PO) Take 1 tablet by mouth daily.   12/22/2018 at Unknown time  . guaiFENesin (MUCINEX) 600 MG 12 hr tablet Take 2 tablets (1,200 mg total) by mouth 2 (two) times daily. (Patient taking differently: Take 600 mg by mouth daily. ) 30 tablet 1 12/22/2018 at Unknown time  . insulin lispro (HUMALOG) 100 UNIT/ML injection Inject 12 Units into the skin once.    12/22/2018 at 1000am  . Multiple Vitamin (MULTIVITAMIN WITH MINERALS) TABS Take 1 tablet by mouth daily.    12/22/2018 at Unknown time  . multivitamin-lutein (OCUVITE-LUTEIN) CAPS capsule Take 1 capsule by mouth daily.   12/22/2018 at Unknown time  .  Omega-3 Fatty Acids (FISH OIL) 1000 MG CAPS Take 1,000 mg by mouth daily.   12/22/2018 at Unknown time  . SYNTHROID 100 MCG tablet Take  100 mcg by mouth every morning.    12/22/2018 at Unknown time  . TOUJEO SOLOSTAR 300 UNIT/ML SOPN Inject 20-22 Units into the skin every morning.    12/22/2018 at 1000am  . vitamin B-12 (CYANOCOBALAMIN) 1000 MCG tablet Take 1,000 mcg by mouth daily.   12/22/2018 at Unknown time   Scheduled: . aspirin  300 mg Rectal Daily   Or  . aspirin  325 mg Oral Daily  . atorvastatin  40 mg Oral q1800  . guaiFENesin  600 mg Oral Daily  . heparin  5,000 Units Subcutaneous Q8H  . insulin aspart  0-9 Units Subcutaneous TID WC  . insulin glargine  15 Units Subcutaneous q morning - 10a  . levothyroxine  100 mcg Oral q morning - 10a  . multivitamin with minerals  1 tablet Oral Daily  . omega-3 acid ethyl esters  1 g Oral Daily  . vitamin B-12  1,000 mcg Oral Daily   Continuous: . sodium chloride 50 mL/hr at 12/24/18 0130   QIO:[id number] **OR** acetaminophen (TYLENOL) oral liquid 160 mg/5 mL **OR** acetaminophen, senna-docusate  Assesment: She is admitted with what I think is a stroke.  She still has left arm numbness.  PT OT speech and neurology consults are pending.  Echocardiogram pending.  She has diabetes which is being treated with insulin  She has COPD which is stable  She has swelling of the legs and echocardiogram should help with that Active Problems:   Stroke-like symptoms   Stroke [facility])    Plan: Full admission.  Consults as above.    LOS: 0 days   Alonza Bogus 12/24/2018, 9:02 AM

## 2018-12-24 NOTE — Care Management Important Message (Signed)
Important Message  Patient Details  Name: Danielle [name] MRN: [id number] Date of Birth: 12/09/1931   Medicare Important Message Given:  Yes     Tommy Medal 12/24/2018, 1:10 PM

## 2018-12-24 NOTE — Progress Notes (Signed)
SLP Cancellation Note  Patient Details Name: Danielle [name] MRN: [id number] DOB: [birth year]   Cancelled treatment:       Reason Eval/Treat Not Completed: SLP screened. Pt has some cognitive linguistic deficits from previous stroke, however, she presents today at her cognitive baseline with no changes in speech. No ST needs noted at this time, ST to sign off. Thank you,  Amelia H. Roddie Mc, CCC-SLP Speech Language Pathologist    Wende Bushy 12/24/2018, 9:15 AM

## 2018-12-24 NOTE — ED Notes (Signed)
Patient repositioned and given a warm blanket.

## 2018-12-24 NOTE — Plan of Care (Signed)
  Problem: Acute Rehab PT Goals(only PT should resolve) Goal: Pt Will Go Supine/Side To Sit Outcome: Progressing Flowsheets (Taken 12/24/2018 1113) Pt will go Supine/Side to Sit: with min guard assist Goal: Patient Will Transfer Sit To/From Stand Outcome: Progressing Flowsheets (Taken 12/24/2018 1113) Patient will transfer sit to/from stand: with min guard assist Goal: Pt Will Transfer Bed To Chair/Chair To Bed 12/24/2018 1114 by Lonell Grandchild, PT Flowsheets (Taken 12/24/2018 1114) Pt will Transfer Bed to Chair/Chair to Bed: min guard assist 12/24/2018 1113 by Lonell Grandchild, PT Outcome: Progressing Goal: Pt Will Ambulate 12/24/2018 1114 by Lonell Grandchild, PT Flowsheets (Taken 12/24/2018 1114) Pt will Ambulate:  75 feet  with minimal assist  with cane Note: Quad-cane or SPC 12/24/2018 1113 by Lonell Grandchild, PT Outcome: Progressing   11:14 AM, 12/24/18 Lonell Grandchild, MPT Physical Therapist with [facility] [phone] office [phone] mobile phone

## 2018-12-24 NOTE — Progress Notes (Signed)
*  PRELIMINARY RESULTS* Echocardiogram 2D Echocardiogram has been performed.  Danielle [name] 12/24/2018, 3:26 PM

## 2018-12-24 NOTE — ED Notes (Signed)
Patient given coffee upon request. Patient repositioned in bed. Patient had urine out put of 700 ml's. Patient's pure wick changed along with canister. Patient given warm blankets.

## 2018-12-24 NOTE — Progress Notes (Signed)
EEG complete - results pending 

## 2018-12-24 NOTE — Progress Notes (Signed)
Pt incorrectly received 5 units of novalog instead of 2 units per sliding scale. MD Luan Pulling notified, no new orders other than monitor for s/s hypoglycemia. Pt remains awake and alert, skin warm and dry. No complaints. Oncoming nurse notified of error and to monitor for s/s hypoglycemia.

## 2018-12-24 NOTE — TOC Progression Note (Addendum)
Transition of Care [facility]) - Progression Note    Patient Details  Name: Danielle [name] MRN: [id number] Date of Birth: 12/03/1931  Transition of Care [facility]) CM/SW Contact  Mahsa Hanser, Chauncey Reading, RN Phone Number: 12/24/2018, 2:14 PM  Clinical Narrative:   PT recommendation of SNF;Supervision for mobility/OOB;Supervision - Intermittent, discussed with patient and husband. Husband is with patient at home 24/7 and he assists her as needed. She has a quad cane, RW, and 3 in 1 at home. Discussed with husband that patient walked 40 feet with min/mod assist. Husband and patient elect to go home with home health PT. [facility] past, would like them again.   SLP and OT evaluations pending.   Expected Discharge Plan and Services Expected Discharge Plan: [place] In-house Referral: Clinical Social Work     Living arrangements for the past 2 months: [place]: PT [place]: [place] ([facility]) Date [place]: 12/24/18 Time [place]: 3491 Representative spoke with at [place]: Vaughan Basta        Expected Discharge Plan: [place] Barriers to Discharge: Continued Medical Work up  Expected Discharge Plan and Services Expected Discharge Plan: [place] In-house Referral: Clinical Social Work     Living arrangements for the past 2 months: [place]

## 2018-12-25 DIAGNOSIS — I639 Cerebral infarction, unspecified: Secondary | ICD-10-CM

## 2018-12-25 LAB — GLUCOSE, CAPILLARY
Glucose-Capillary: 122 mg/dL — ABNORMAL HIGH (ref 70–99)
Glucose-Capillary: 222 mg/dL — ABNORMAL HIGH (ref 70–99)
Glucose-Capillary: 249 mg/dL — ABNORMAL HIGH (ref 70–99)
Glucose-Capillary: 169 mg/dL — ABNORMAL HIGH (ref 70–99)

## 2018-12-25 NOTE — Progress Notes (Signed)
Physical Therapy Treatment Patient Details Name: Danielle [name] MRN: [id number] DOB: [birth year] Today's Date: 12/25/2018    History of Present Illness Danielle [name] is a 85 y.o. female with a past medical history significant for hypertension, hyperlipidemia, hypothyroidism, type 2 diabetes mellitus insulin-dependent, prior history of stroke and meningioma; who presented to the emergency department secondary to left upper extremity weakness.  Symptoms presented approximately 2-3 days prior to admission and has continued progressing to the point of being unable to move her arm without assistance.  Patient is also experiencing difficulty opening her hands or holding anything.  She denies any chest pain, fever, chills, headaches, blurred vision, dysuria, hematuria, melena, nausea/vomiting, hematochezia or any other focal neurologic deficit.    PT Comments    Pt with spouse at bedside, initially answering questions with head nods and eyes closed improving to verbal responses and eyes open once upright sitting. Pt A&Ox3, responds to questions appropriately and agreeable to therapy. Pt continues to require mod assist for supine to sit for trunk uprighting and LUE support. Sitting balance improved with verbal cues to decrease posterior lean and able to maintain upright sitting without assistance. Pt initially performed STS reps using quad cane with min assist, progressing to min guard once educated to push from seated surface to rise and improving upright steadiness with standing. Pt able to ambulate from bed to toilet and back to chair with min assist and therapist supporting pt's LUE due to neglect and it dangling. Pt limited secondary to fatigue with gait, improved to step through gait pattern, equal bil step length, no foot drop noted and fair balance using quad cane. Pt occasionally reports "ouch" when LUE supported by therapist but pt and pt's spouse report she is tolerating more touch with less  pain today compared to yesterday. Pt able to perform seated BLE strengthening exercises and standing mini squats with verbal cues for slow, controlled movement. Pt tolerates remaining up in chair at EOS with spouse, nurse, and MD in room. Patient will benefit from continued physical therapy in hospital and recommended venue below to increase strength, balance, endurance for safe ADLs and gait.   Follow Up Recommendations  SNF;Supervision for mobility/OOB;Supervision - Intermittent     Equipment Recommendations  None recommended by PT    Recommendations for Other Services OT consult(LUE neglect)     Precautions / Restrictions Precautions Precautions: Fall Precaution Comments: LUE neglect Restrictions Weight Bearing Restrictions: No    Mobility  Bed Mobility Overal bed mobility: Needs Assistance Bed Mobility: Supine to Sit     Supine to sit: Mod assist     General bed mobility comments: slow, labored movement, required LUE support, assistance for trunk uprighting  Transfers Overall transfer level: Needs assistance Equipment used: Quad cane Transfers: Sit to/from [facility] Sit to Stand: Min assist;Min guard Stand pivot transfers: Min assist;Min guard       General transfer comment: slow, labored movement; intially unsteady but progressed to min guard with quad cane and grab bar  Ambulation/Gait Ambulation/Gait assistance: Min assist Gait Distance (Feet): 20 Feet Assistive device: Quad cane Gait Pattern/deviations: Decreased step length - right;Decreased step length - left;Decreased stride length;Step-through pattern Gait velocity: decreased   General Gait Details: slow, labored movement, neglect LUE requiring support/weightbearing of LUE by therapist   Stairs             Wheelchair Mobility    Modified Rankin (Stroke Patients Only)       Balance Overall balance assessment: Needs  assistance Sitting-balance support: Feet supported;No  upper extremity supported Sitting balance-Leahy Scale: Fair Sitting balance - Comments: seated EOB, leaning posterior improved with verbal cues   Standing balance support: During functional activity;Single extremity supported Standing balance-Leahy Scale: Fair Standing balance comment: with quad cane                            Cognition Arousal/Alertness: Awake/alert Behavior During Therapy: WFL for tasks assessed/performed Overall Cognitive Status: Within Functional Limits for tasks assessed                                        Exercises General Exercises - Lower Extremity Long Arc Quad: Seated;Strengthening;Both;10 reps Hip Flexion/Marching: Seated;Strengthening;Both;10 reps Toe Raises: Seated;Strengthening;Both;20 reps Heel Raises: Seated;Strengthening;Both;20 reps Mini-Sqauts: Strengthening;10 reps(R quad cane, LUE supported by therapist)    General Comments        Pertinent Vitals/Pain Pain Assessment: No/denies pain(occasionally reports "ouch" when supporting LUE) Pain Intervention(s): Limited activity within patient's tolerance;Monitored during session;Repositioned    Home Living                      Prior Function            PT Goals (current goals can now be found in the care plan section) Acute Rehab PT Goals Patient Stated Goal: return home with family to assist PT Goal Formulation: With patient Time For Goal Achievement: 01/07/19 Potential to Achieve Goals: Good Progress towards PT goals: Progressing toward goals    Frequency    7X/week      PT Plan Current plan remains appropriate    Co-evaluation              AM-PAC PT "6 Clicks" Mobility   Outcome Measure  Help needed turning from your back to your side while in a flat bed without using bedrails?: A Lot Help needed moving from lying on your back to sitting on the side of a flat bed without using bedrails?: A Lot Help needed moving to and from a  bed to a chair (including a wheelchair)?: A Lot Help needed standing up from a chair using your arms (e.g., wheelchair or bedside chair)?: A Little Help needed to walk in hospital room?: A Little Help needed climbing 3-5 steps with a railing? : A Lot 6 Click Score: 14    End of Session Equipment Utilized During Treatment: Gait belt Activity Tolerance: Patient tolerated treatment well;Patient limited by fatigue Patient left: in chair;with nursing/sitter in room;with family/visitor present Nurse Communication: Mobility status PT Visit Diagnosis: Unsteadiness on feet (R26.81);Other abnormalities of gait and mobility (R26.89);Muscle weakness (generalized) (M62.81)     Time: 4034-7425 PT Time Calculation (min) (ACUTE ONLY): 29 min  Charges:  $Therapeutic Exercise: 8-22 mins $Therapeutic Activity: 8-22 mins                     Tori Emsley Custer PT, DPT 12/25/18, 10:09 AM [phone]

## 2018-12-25 NOTE — Procedures (Addendum)
Patient Name: Danielle [name]  MRN: [id number]  Epilepsy Attending: Lora Havens  Referring Physician/Provider: Dr Sinda Du Date: 12/24/2018 Duration: 22.24 mins  Patient history: 85yo F with strokes. EEG ordered to evaluate for seizure.   Level of alertness: awake, drowsy  Technical aspects: This EEG study was done with scalp electrodes positioned according to the 10-20 International system of electrode placement. Electrical activity was acquired at a sampling rate of 500Hz  and reviewed with a high frequency filter of 70Hz  and a low frequency filter of 1Hz . EEG data were recorded continuously and digitally stored.   DESCRIPTION:  The posterior dominant rhythm consists of 9-10 Hz activity of moderate voltage (25-35 uV) seen predominantly in posterior head regions, symmetric and reactive to eye opening and eye closing. Drowsiness was characterized by attenuation of the posterior background rhythm. There was continuous low voltage 3-6hz  delta slowing in right frontotemporal region as well as intermittent 2-3hz  delta slowing in left temporal region. Hyperventilation and photic stimulation were not performed.  IMPRESSION: This study is suggestive of cortical dysfunction in right frontotemporal region as well as mild cortical dysfunction in left temporal region. No seizures or epileptiform discharges were seen throughout the recording.  However, only wakefulness and drowsiness were recorded. If suspicion for interictal activity remains a concern, a prolonged study including sleep should be considered.   Mandeep Kiser Barbra Sarks

## 2018-12-25 NOTE — Progress Notes (Signed)
Subjective: She had an episode of confusion yesterday and is not totally clear what this is from.  She had EEG that does not show evidence of seizure.  Blood gas was okay.  It resolved spontaneously.  Objective: Vital signs in last 24 hours: Temp:  [98.1 F (36.7 C)-98.7 F (37.1 C)] 98.5 F (36.9 C) (08/08 1005) Pulse Rate:  [74-90] 85 (08/08 1005) Resp:  [15-18] 17 (08/08 1005) BP: (131-188)/(52-86) 131/69 (08/08 1005) SpO2:  [95 %-100 %] 97 % (08/08 1005) Weight change:  Last BM Date: 12/22/18  Intake/Output from previous day: 08/07 0701 - 08/08 0700 In: 2406 [P.O.:600; I.V.:1806] Out: -   PHYSICAL EXAM General appearance: alert, cooperative and no distress Resp: clear to auscultation bilaterally Cardio: regular rate and rhythm, S1, S2 normal, no murmur, click, rub or gallop GI: soft, non-tender; bowel sounds normal; no masses,  no organomegaly Extremities: extremities normal, atraumatic, no cyanosis or edema She still has no significant use of her left arm Lab Results:  Results for orders placed or performed during the hospital encounter of 12/23/18 (from the past 48 hour(s))  CBC with Differential     Status: None   Collection Time: 12/23/18 10:37 AM  Result Value Ref Range   WBC 6.5 4.0 - 10.5 K/uL   RBC 4.02 3.87 - 5.11 MIL/uL   Hemoglobin 12.1 12.0 - 15.0 g/dL   HCT 37.1 36.0 - 46.0 %   MCV 92.3 80.0 - 100.0 fL   MCH 30.1 26.0 - 34.0 pg   MCHC 32.6 30.0 - 36.0 g/dL   RDW 14.5 11.5 - 15.5 %   Platelets 281 150 - 400 K/uL   nRBC 0.0 0.0 - 0.2 %   Neutrophils Relative % 57 %   Neutro Abs 3.7 1.7 - 7.7 K/uL   Lymphocytes Relative 27 %   Lymphs Abs 1.7 0.7 - 4.0 K/uL   Monocytes Relative 11 %   Monocytes Absolute 0.7 0.1 - 1.0 K/uL   Eosinophils Relative 4 %   Eosinophils Absolute 0.3 0.0 - 0.5 K/uL   Basophils Relative 1 %   Basophils Absolute 0.1 0.0 - 0.1 K/uL   Immature Granulocytes 0 %   Abs Immature Granulocytes 0.01 0.00 - 0.07 K/uL    Comment:  Performed at [facility], [address], [place], [place] [address]  Basic metabolic panel     Status: Abnormal   Collection Time: 12/23/18 10:37 AM  Result Value Ref Range   Sodium 137 135 - 145 mmol/L   Potassium 3.9 3.5 - 5.1 mmol/L   Chloride 110 98 - 111 mmol/L   CO2 21 (L) 22 - 32 mmol/L   Glucose, Bld 329 (H) 70 - 99 mg/dL   BUN 21 8 - 23 mg/dL   Creatinine, Ser 0.86 0.44 - 1.00 mg/dL   Calcium 8.5 (L) 8.9 - 10.3 mg/dL   GFR calc non Af Amer >60 >60 mL/min   GFR calc Af Amer >60 >60 mL/min   Anion gap 6 5 - 15    Comment: Performed at [facility], [address] [place], [place] [address]  Hemoglobin A1c     Status: Abnormal   Collection Time: 12/23/18 10:37 AM  Result Value Ref Range   Hgb A1c MFr Bld 10.3 (H) 4.8 - 5.6 %    Comment: (NOTE) Pre diabetes:          5.7%-6.4% Diabetes:              >6.4% Glycemic control for   <  7.0% adults with diabetes    Mean Plasma Glucose 248.91 mg/dL    Comment: Performed at [facility] [address] [place], [place] [address]  TSH     Status: Abnormal   Collection Time: 12/23/18  1:06 PM  Result Value Ref Range   TSH 0.042 (L) 0.350 - 4.500 uIU/mL    Comment: Performed by a 3rd Generation assay with a functional sensitivity of <=0.01 uIU/mL. Performed at [facility], [address] [place], [place] [address]   CBG monitoring, ED     Status: Abnormal   Collection Time: 12/23/18  5:27 PM  Result Value Ref Range   Glucose-Capillary 218 (H) 70 - 99 mg/dL  Lipid panel     Status: None   Collection Time: 12/24/18  4:43 AM  Result Value Ref Range   Cholesterol 171 0 - 200 mg/dL   Triglycerides 75 <150 mg/dL   HDL 59 >40 mg/dL   Total CHOL/HDL Ratio 2.9 RATIO   VLDL 15 0 - 40 mg/dL   LDL Cholesterol 97 0 - 99 mg/dL    Comment:        Total Cholesterol/HDL:CHD Risk Coronary Heart Disease Risk Table                     Men   Women  1/2 Average Risk   3.4   3.3  Average Risk       5.0   4.4  2 X Average Risk   9.6    7.1  3 X Average Risk  23.4   11.0        Use the calculated Patient Ratio above and the CHD Risk Table to determine the patient's CHD Risk.        ATP III CLASSIFICATION (LDL):  <100     mg/dL   Optimal  100-129  mg/dL   Near or Above                    Optimal  130-159  mg/dL   Borderline  160-189  mg/dL   High  >190     mg/dL   Very High Performed at [facility], [address] [place],  [address]   CBG monitoring, ED     Status: Abnormal   Collection Time: 12/24/18  6:20 AM  Result Value Ref Range   Glucose-Capillary 133 (H) 70 - 99 mg/dL  Glucose, capillary     Status: Abnormal   Collection Time: 12/24/18  8:20 AM  Result Value Ref Range   Glucose-Capillary 123 (H) 70 - 99 mg/dL  Glucose, capillary     Status: Abnormal   Collection Time: 12/24/18 12:05 PM  Result Value Ref Range   Glucose-Capillary 238 (H) 70 - 99 mg/dL  Glucose, capillary     Status: Abnormal   Collection Time: 12/24/18  2:17 PM  Result Value Ref Range   Glucose-Capillary 289 (H) 70 - 99 mg/dL  Blood gas, arterial     Status: Abnormal   Collection Time: 12/24/18  2:45 PM  Result Value Ref Range   FIO2 21.00    pH, Arterial 7.453 (H) 7.350 - 7.450   pCO2 arterial 34.9 32.0 - 48.0 mmHg   pO2, Arterial 85.0 83.0 - 108.0 mmHg   Bicarbonate 25.2 20.0 - 28.0 mmol/L   Acid-Base Excess 0.5 0.0 - 2.0 mmol/L   O2 Saturation 96.4 %   Patient temperature 37.2    Allens test (pass/fail) PASS PASS  Comment: Performed at [facility], [address] [place], [place] [address]  Glucose, capillary     Status: Abnormal   Collection Time: 12/24/18  6:14 PM  Result Value Ref Range   Glucose-Capillary 153 (H) 70 - 99 mg/dL  Glucose, capillary     Status: Abnormal   Collection Time: 12/24/18  9:18 PM  Result Value Ref Range   Glucose-Capillary 184 (H) 70 - 99 mg/dL  Glucose, capillary     Status: Abnormal   Collection Time: 12/25/18  7:24 AM  Result Value Ref Range   Glucose-Capillary 122 (H) 70 - 99  mg/dL    ABGS Recent Labs    12/24/18 1445  PHART 7.453*  PO2ART 85.0  HCO3 25.2   CULTURES Recent Results (from the past 240 hour(s))  SARS CORONAVIRUS 2 Nasal Swab Aptima Multi Swab     Status: None   Collection Time: 12/23/18  9:57 AM   Specimen: Aptima Multi Swab; Nasal Swab  Result Value Ref Range Status   SARS Coronavirus 2 NEGATIVE NEGATIVE Final    Comment: (NOTE) SARS-CoV-2 target nucleic acids are NOT DETECTED. The SARS-CoV-2 RNA is generally detectable in upper and lower respiratory specimens during the acute phase of infection. Negative results do not preclude SARS-CoV-2 infection, do not rule out co-infections with other pathogens, and should not be used as the sole basis for treatment or other patient management decisions. Negative results must be combined with clinical observations, patient history, and epidemiological information. The expected result is Negative. Fact Sheet for Patients: SugarRoll.be Fact Sheet for Healthcare Providers: https://www.woods-mathews.com/ This test is not yet approved or cleared by the [place] FDA and  has been authorized for detection and/or diagnosis of SARS-CoV-2 by FDA under an Emergency Use Authorization (EUA). This EUA will remain  in effect (meaning this test can be used) for the duration of the COVID-19 declaration under Section 56 4(b)(1) of the Act, 21 U.S.C. section 360bbb-3(b)(1), unless the authorization is terminated or revoked sooner. Performed at [facility], [place] [address] [place], [place] [address]    Studies/Results: Ct Head Wo Contrast  Result Date: 12/23/2018 CLINICAL DATA:  Left upper extremity weakness EXAM: CT HEAD WITHOUT CONTRAST TECHNIQUE: Contiguous axial images were obtained from the base of the skull through the vertex without intravenous contrast. COMPARISON:  09/27/2018 FINDINGS: Brain: Redemonstration of a mass arising from the anterior falx  on the left measuring approximately 2.6 x 2.8 cm, stable in size compared to prior. Internal focus of low-density likely internal cystic change, also stable from prior. Mass results in mild mass effect on the anterior horn of the left lateral ventricle. No appreciable surrounding edema. Encephalomalacia in the right frontal lobe from prior right MCA distribution infarct. Additional areas of encephalomalacia in the right basal ganglia from prior infarcts, also unchanged. No evidence of new infarct, hemorrhage, or extra-axial fluid collection. Scattered low-density changes within the periventricular and subcortical white matter compatible with chronic microvascular ischemic change. Ventricular size and configuration is unchanged. Vascular: Atherosclerotic calcifications involving the large vessels of the skull base. No unexpected hyperdense vessel. Skull: Normal. Negative for fracture or focal lesion. Sinuses/Orbits: No acute finding. Other: None. IMPRESSION: 1. No evidence of an acute intracranial abnormality. 2. Stable size of left anterior parafalcine meningioma. 3. Chronic microvascular ischemic changes as well as prior right MCA distribution infarct and prior right basal ganglia lacunar infarcts. Electronically Signed   By: Davina Poke M.D.   On: 12/23/2018 12:02   Mr Angio  Head Wo Contrast  Result Date: 12/23/2018 CLINICAL DATA:  Left arm weakness. EXAM: MRI HEAD WITHOUT CONTRAST MRA HEAD WITHOUT CONTRAST TECHNIQUE: Multiplanar, multiecho pulse sequences of the brain and surrounding structures were obtained without intravenous contrast. Angiographic images of the head were obtained using MRA technique without contrast. COMPARISON:  Head CT 12/23/2018.  Head MRI and MRA 05/03/2018. FINDINGS: MRI HEAD FINDINGS Brain: There are multiple small foci of restricted diffusion in the right MCA territory consistent with acute infarcts primarily involving cortex of the right frontal and parietal lobes. A chronic  right MCA infarct is again noted involving the insula, basal ganglia, and frontal lobe with ex vacuo dilatation of the right frontal horn and hemosiderin staining. Scattered T2 hyperintensities elsewhere in the cerebral white matter bilaterally are similar to the prior MRI and nonspecific but compatible with mild chronic small vessel ischemic disease. An extra-axial mass anteriorly along the falx on the left has not significantly changed in size from the prior MRI and measures 2.2 x 2.8 x 3.7 cm. There is local mass effect on the left frontal lobe without edema. Mild generalized cerebral atrophy is not greater than expected for age. Vascular: Chronic right ICA occlusion. Skull and upper cervical spine: Unremarkable bone marrow signal. Sinuses/Orbits: Bilateral cataract extraction. Paranasal sinuses and mastoid air cells are clear. Other: None. MRA HEAD FINDINGS The visualized distal vertebral arteries are patent to the basilar with the left being dominant. The basilar artery is patent with minimal narrowing distally. There is a large left posterior communicating artery. Severe bilateral P2 stenoses are again noted. There is chronic occlusion of the right ICA with distal reconstitution near the terminus. The right M1 and proximal M2 segments are poorly visualized, more so than on the prior MRA and suggesting severely reduced flow. There is some reconstitution of more robust flow related enhancement in the more distal right MCA branches. No significant flow related enhancement is apparent today in the right A1 segment which may reflect further reduced flow or interval occlusion. The right A2 segment is patent and supplied by the left ACA via the anterior communicating artery. The left intracranial ICA is widely patent. The left ACA and left MCA are patent without evidence of proximal branch occlusion or significant proximal stenosis. No aneurysm is identified. IMPRESSION: 1. Multiple small acute right MCA infarcts. 2.  Chronic right MCA infarct. 3. 3.7 cm left anterior parafalcine meningioma, not significantly changed in size from 2019 and without edema. 4. 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 Severely diminished flow in the proximal right MCA and possible interval occlusion of the right A1 segment. Electronically Signed   By: Logan Bores M.D.   On: 12/23/2018 14:57   Mr Brain Wo Contrast  Result Date: 12/23/2018 CLINICAL DATA:  Left arm weakness. EXAM: MRI HEAD WITHOUT CONTRAST MRA HEAD WITHOUT CONTRAST TECHNIQUE: Multiplanar, multiecho pulse sequences of the brain and surrounding structures were obtained without intravenous contrast. Angiographic images of the head were obtained using MRA technique without contrast. COMPARISON:  Head CT 12/23/2018.  Head MRI and MRA 05/03/2018. FINDINGS: MRI HEAD FINDINGS Brain: There are multiple small foci of restricted diffusion in the right MCA territory consistent with acute infarcts primarily involving cortex of the right frontal and parietal lobes. A chronic right MCA infarct is again noted involving the insula, basal ganglia, and frontal lobe with ex vacuo dilatation of the right frontal horn and hemosiderin staining. Scattered T2 hyperintensities elsewhere in the cerebral white matter bilaterally  are similar to the prior MRI and nonspecific but compatible with mild chronic small vessel ischemic disease. An extra-axial mass anteriorly along the falx on the left has not significantly changed in size from the prior MRI and measures 2.2 x 2.8 x 3.7 cm. There is local mass effect on the left frontal lobe without edema. Mild generalized cerebral atrophy is not greater than expected for age. Vascular: Chronic right ICA occlusion. Skull and upper cervical spine: Unremarkable bone marrow signal. Sinuses/Orbits: Bilateral cataract extraction. Paranasal sinuses and mastoid air cells are clear. Other: None. MRA HEAD FINDINGS The visualized distal vertebral  arteries are patent to the basilar with the left being dominant. The basilar artery is patent with minimal narrowing distally. There is a large left posterior communicating artery. Severe bilateral P2 stenoses are again noted. There is chronic occlusion of the right ICA with distal reconstitution near the terminus. The right M1 and proximal M2 segments are poorly visualized, more so than on the prior MRA and suggesting severely reduced flow. There is some reconstitution of more robust flow related enhancement in the more distal right MCA branches. No significant flow related enhancement is apparent today in the right A1 segment which may reflect further reduced flow or interval occlusion. The right A2 segment is patent and supplied by the left ACA via the anterior communicating artery. The left intracranial ICA is widely patent. The left ACA and left MCA are patent without evidence of proximal branch occlusion or significant proximal stenosis. No aneurysm is identified. IMPRESSION: 1. Multiple small acute right MCA infarcts. 2. Chronic right MCA infarct. 3. 3.7 cm left anterior parafalcine meningioma, not significantly changed in size from 2019 and without edema. 4. 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 Severely diminished flow in the proximal right MCA and possible interval occlusion of the right A1 segment. Electronically Signed   By: Logan Bores M.D.   On: 12/23/2018 14:57   US Carotid Bilateral (at [facility] Only)  Result Date: 12/23/2018 CLINICAL DATA:  Small acute right MCA territory infarcts. History of chronic right ICA occlusion EXAM: BILATERAL CAROTID DUPLEX ULTRASOUND TECHNIQUE: Pearline Cables scale imaging, color Doppler and duplex ultrasound were performed of bilateral carotid and vertebral arteries in the neck. COMPARISON:  12/23/2018 MRI FINDINGS: Criteria: Quantification of carotid stenosis is based on velocity parameters that correlate the residual internal carotid  diameter with NASCET-based stenosis levels, using the diameter of the distal internal carotid lumen as the denominator for stenosis measurement. The following velocity measurements were obtained: RIGHT ICA: Occluded cm/sec CCA: 33/4 cm/sec ECA: 283 cm/sec LEFT ICA: 174/44 cm/sec CCA: 16/96 cm/sec SYSTOLIC ICA/CCA RATIO:  3.7 ECA: 114 cm/sec RIGHT CAROTID ARTERY: Extensive right carotid atherosclerosis. CCA and ECA remain patent. Chronic occlusion of the right ICA as before. RIGHT VERTEBRAL ARTERY:  Antegrade LEFT CAROTID ARTERY: Moderate heterogeneous calcified atherosclerosis. Slight velocity elevation measuring 174/44 centimeters/second. Mild turbulent flow and spectral broadening. Left ICA stenosis estimated at 50-69% by ultrasound criteria. LEFT VERTEBRAL ARTERY:  Antegrade IMPRESSION: Chronically occluded right ICA as before. Moderate left ICA atherosclerosis. Left ICA stenosis estimated at 50-69% Patent antegrade vertebral flow bilaterally Electronically Signed   By: Jerilynn Mages.  Shick M.D.   On: 12/23/2018 15:28    Medications:  Prior to Admission:  Medications Prior to Admission  Medication Sig Dispense Refill Last Dose  . aspirin EC 81 MG tablet Take 162 mg by mouth every morning.    12/23/2018 at Unknown time  . atorvastatin (LIPITOR)  40 MG tablet Take 1 tablet (40 mg total) by mouth daily at 6 PM. 30 tablet 0 12/22/2018 at Unknown time  . Calcium Carb-Cholecalciferol (CALCIUM 600 + D PO) Take 1 tablet by mouth daily.   12/22/2018 at Unknown time  . guaiFENesin (MUCINEX) 600 MG 12 hr tablet Take 2 tablets (1,200 mg total) by mouth 2 (two) times daily. (Patient taking differently: Take 600 mg by mouth daily. ) 30 tablet 1 12/22/2018 at Unknown time  . insulin lispro (HUMALOG) 100 UNIT/ML injection Inject 12 Units into the skin once.    12/22/2018 at 1000am  . Multiple Vitamin (MULTIVITAMIN WITH MINERALS) TABS Take 1 tablet by mouth daily.    12/22/2018 at Unknown time  . multivitamin-lutein (OCUVITE-LUTEIN) CAPS  capsule Take 1 capsule by mouth daily.   12/22/2018 at Unknown time  . Omega-3 Fatty Acids (FISH OIL) 1000 MG CAPS Take 1,000 mg by mouth daily.   12/22/2018 at Unknown time  . SYNTHROID 100 MCG tablet Take 100 mcg by mouth every morning.    12/22/2018 at Unknown time  . TOUJEO SOLOSTAR 300 UNIT/ML SOPN Inject 20-22 Units into the skin every morning.    12/22/2018 at 1000am  . vitamin B-12 (CYANOCOBALAMIN) 1000 MCG tablet Take 1,000 mcg by mouth daily.   12/22/2018 at Unknown time   Scheduled: . aspirin  300 mg Rectal Daily   Or  . aspirin  325 mg Oral Daily  . atorvastatin  40 mg Oral q1800  . ciprofloxacin  250 mg Oral BID  . guaiFENesin  600 mg Oral Daily  . heparin  5,000 Units Subcutaneous Q8H  . insulin aspart  0-9 Units Subcutaneous TID WC  . insulin glargine  15 Units Subcutaneous q morning - 10a  . levothyroxine  100 mcg Oral q morning - 10a  . multivitamin with minerals  1 tablet Oral Daily  . omega-3 acid ethyl esters  1 g Oral Daily  . vitamin B-12  1,000 mcg Oral Daily   Continuous: . sodium chloride 50 mL/hr at 12/25/18 1224   MGN:[id number] **OR** acetaminophen (TYLENOL) oral liquid 160 mg/5 mL **OR** acetaminophen, senna-docusate  Assesment: I think she is had another stroke.  She is working with physical therapy.  No significant change  She has had an episode of confusion and is not clear what that was from  She has hypertension which is stable  She has diabetes on sliding scale Active Problems:   Stroke-like symptoms   Stroke [facility])    Plan: Continue current treatments    LOS: 1 day   Danielle [name] 12/25/2018, 10:18 AM

## 2018-12-26 LAB — GLUCOSE, CAPILLARY
Glucose-Capillary: 129 mg/dL — ABNORMAL HIGH (ref 70–99)
Glucose-Capillary: 343 mg/dL — ABNORMAL HIGH (ref 70–99)
Glucose-Capillary: 194 mg/dL — ABNORMAL HIGH (ref 70–99)
Glucose-Capillary: 294 mg/dL — ABNORMAL HIGH (ref 70–99)

## 2018-12-26 NOTE — Progress Notes (Signed)
Subjective: She says she feels okay.  No new complaints.  She is working with physical therapy but still neglecting her left arm  Objective: Vital signs in last 24 hours: Temp:  [98 F (36.7 C)-98.5 F (36.9 C)] 98.3 F (36.8 C) (08/09 0650) Pulse Rate:  [65-85] 78 (08/09 0650) Resp:  [16-18] 17 (08/09 0650) BP: (131-182)/(55-88) 182/70 (08/09 0650) SpO2:  [95 %-100 %] 96 % (08/09 0650) Weight change:  Last BM Date: 12/24/18  Intake/Output from previous day: No intake/output data recorded.  PHYSICAL EXAM General appearance: alert, cooperative and no distress Resp: clear to auscultation bilaterally Cardio: regular rate and rhythm, S1, S2 normal, no murmur, click, rub or gallop GI: soft, non-tender; bowel sounds normal; no masses,  no organomegaly Extremities: Left arm essentially flaccid  Lab Results:  Results for orders placed or performed during the hospital encounter of 12/23/18 (from the past 48 hour(s))  Glucose, capillary     Status: Abnormal   Collection Time: 12/24/18 12:05 PM  Result Value Ref Range   Glucose-Capillary 238 (H) 70 - 99 mg/dL  Glucose, capillary     Status: Abnormal   Collection Time: 12/24/18  2:17 PM  Result Value Ref Range   Glucose-Capillary 289 (H) 70 - 99 mg/dL  Blood gas, arterial     Status: Abnormal   Collection Time: 12/24/18  2:45 PM  Result Value Ref Range   FIO2 21.00    pH, Arterial 7.453 (H) 7.350 - 7.450   pCO2 arterial 34.9 32.0 - 48.0 mmHg   pO2, Arterial 85.0 83.0 - 108.0 mmHg   Bicarbonate 25.2 20.0 - 28.0 mmol/L   Acid-Base Excess 0.5 0.0 - 2.0 mmol/L   O2 Saturation 96.4 %   Patient temperature 37.2    Allens test (pass/fail) PASS PASS    Comment: Performed at [facility], [address], [place], [place] [address]  Glucose, capillary     Status: Abnormal   Collection Time: 12/24/18  6:14 PM  Result Value Ref Range   Glucose-Capillary 153 (H) 70 - 99 mg/dL  Glucose, capillary     Status: Abnormal   Collection Time:  12/24/18  9:18 PM  Result Value Ref Range   Glucose-Capillary 184 (H) 70 - 99 mg/dL  Glucose, capillary     Status: Abnormal   Collection Time: 12/25/18  7:24 AM  Result Value Ref Range   Glucose-Capillary 122 (H) 70 - 99 mg/dL  Glucose, capillary     Status: Abnormal   Collection Time: 12/25/18 11:09 AM  Result Value Ref Range   Glucose-Capillary 222 (H) 70 - 99 mg/dL  Glucose, capillary     Status: Abnormal   Collection Time: 12/25/18  4:01 PM  Result Value Ref Range   Glucose-Capillary 249 (H) 70 - 99 mg/dL  Glucose, capillary     Status: Abnormal   Collection Time: 12/25/18  9:34 PM  Result Value Ref Range   Glucose-Capillary 169 (H) 70 - 99 mg/dL  Glucose, capillary     Status: Abnormal   Collection Time: 12/26/18  7:38 AM  Result Value Ref Range   Glucose-Capillary 129 (H) 70 - 99 mg/dL    ABGS Recent Labs    12/24/18 1445  PHART 7.453*  PO2ART 85.0  HCO3 25.2   CULTURES Recent Results (from the past 240 hour(s))  SARS CORONAVIRUS 2 Nasal Swab Aptima Multi Swab     Status: None   Collection Time: 12/23/18  9:57 AM   Specimen: Aptima Multi Swab; Nasal Swab  Result Value Ref Range Status   SARS Coronavirus 2 NEGATIVE NEGATIVE Final    Comment: (NOTE) SARS-CoV-2 target nucleic acids are NOT DETECTED. The SARS-CoV-2 RNA is generally detectable in upper and lower respiratory specimens during the acute phase of infection. Negative results do not preclude SARS-CoV-2 infection, do not rule out co-infections with other pathogens, and should not be used as the sole basis for treatment or other patient management decisions. Negative results must be combined with clinical observations, patient history, and epidemiological information. The expected result is Negative. Fact Sheet for Patients: SugarRoll.be Fact Sheet for Healthcare Providers: https://www.woods-mathews.com/ This test is not yet approved or cleared by the [place] FDA and  has been authorized for detection and/or diagnosis of SARS-CoV-2 by FDA under an Emergency Use Authorization (EUA). This EUA will remain  in effect (meaning this test can be used) for the duration of the COVID-19 declaration under Section 56 4(b)(1) of the Act, 21 U.S.C. section 360bbb-3(b)(1), unless the authorization is terminated or revoked sooner. Performed at [facility], [place] [address], [place], [place] [address]    Studies/Results: No results found.  Medications:  Prior to Admission:  Medications Prior to Admission  Medication Sig Dispense Refill Last Dose  . aspirin EC 81 MG tablet Take 162 mg by mouth every morning.    12/23/2018 at Unknown time  . atorvastatin (LIPITOR) 40 MG tablet Take 1 tablet (40 mg total) by mouth daily at 6 PM. 30 tablet 0 12/22/2018 at Unknown time  . Calcium Carb-Cholecalciferol (CALCIUM 600 + D PO) Take 1 tablet by mouth daily.   12/22/2018 at Unknown time  . guaiFENesin (MUCINEX) 600 MG 12 hr tablet Take 2 tablets (1,200 mg total) by mouth 2 (two) times daily. (Patient taking differently: Take 600 mg by mouth daily. ) 30 tablet 1 12/22/2018 at Unknown time  . insulin lispro (HUMALOG) 100 UNIT/ML injection Inject 12 Units into the skin once.    12/22/2018 at 1000am  . Multiple Vitamin (MULTIVITAMIN WITH MINERALS) TABS Take 1 tablet by mouth daily.    12/22/2018 at Unknown time  . multivitamin-lutein (OCUVITE-LUTEIN) CAPS capsule Take 1 capsule by mouth daily.   12/22/2018 at Unknown time  . Omega-3 Fatty Acids (FISH OIL) 1000 MG CAPS Take 1,000 mg by mouth daily.   12/22/2018 at Unknown time  . SYNTHROID 100 MCG tablet Take 100 mcg by mouth every morning.    12/22/2018 at Unknown time  . TOUJEO SOLOSTAR 300 UNIT/ML SOPN Inject 20-22 Units into the skin every morning.    12/22/2018 at 1000am  . vitamin B-12 (CYANOCOBALAMIN) 1000 MCG tablet Take 1,000 mcg by mouth daily.   12/22/2018 at Unknown time   Scheduled: . aspirin  300 mg Rectal Daily   Or   . aspirin  325 mg Oral Daily  . atorvastatin  40 mg Oral q1800  . ciprofloxacin  250 mg Oral BID  . guaiFENesin  600 mg Oral Daily  . heparin  5,000 Units Subcutaneous Q8H  . insulin aspart  0-9 Units Subcutaneous TID WC  . insulin glargine  15 Units Subcutaneous q morning - 10a  . levothyroxine  100 mcg Oral q morning - 10a  . multivitamin with minerals  1 tablet Oral Daily  . omega-3 acid ethyl esters  1 g Oral Daily  . vitamin B-12  1,000 mcg Oral Daily   Continuous: . sodium chloride 50 mL/hr at 12/25/18 0017   CBS:[id number] **OR** acetaminophen (TYLENOL) oral liquid 160 mg/5  mL **OR** acetaminophen, senna-docusate  Assesment: She was admitted with a stroke.  She has a left arm paresis.  She has neglect of her left arm.  She has diabetes at baseline which is doing okay.  She had a previous stroke about 6 months ago. Active Problems:   Stroke-like symptoms   Stroke [facility])    Plan: Continue treatments.  Continue with PT OT and speech.    LOS: 2 days   Danielle [name] 12/26/2018, 9:27 AM

## 2018-12-26 NOTE — Progress Notes (Signed)
Physical Therapy Treatment Patient Details Name: Danielle [name] MRN: [id number] DOB: 09-30-1931 Today's Date: 12/26/2018    History of Present Illness Danielle [name] is a 85 y.o. female with a past medical history significant for hypertension, hyperlipidemia, hypothyroidism, type 2 diabetes mellitus insulin-dependent, prior history of stroke and meningioma; who presented to the emergency department secondary to left upper extremity weakness.  Symptoms presented approximately 2-3 days prior to admission and has continued progressing to the point of being unable to move her arm without assistance.  Patient is also experiencing difficulty opening her hands or holding anything.  She denies any chest pain, fever, chills, headaches, blurred vision, dysuria, hematuria, melena, nausea/vomiting, hematochezia or any other focal neurologic deficit.    PT Comments    Pt seated in bedside chair upon arrival, pleasant, alert and agreeable to PT. Pt performs STS and stand pivot transfers using R quad cane and grab bars in bathroom with min guard assist, remembers to push from seated surface and good balance upon rising. Pt navigates room and bathroom using R quad cane with decreased speed and fair balance, able to complete 180 degree turns and tight spaces with min guard assist. Pt amb throughout hallway with occasional unsteadiness, but able to maintain balance without falls or near falls and no physical assistance from therapist. Pt requires min assist for sit to supine for trunk assistance to prevent LUE being neglected and to prevent pt from lying on top of LUE. Therapist positioned pillow under LUE once supine in bed to prevent neglect to LUE, call bell in hand and phone at side. Pt improving with physical independence and could potentially discharge home if assistance available; spouse not at bedside to discuss, but spoke with MD regarding pt's improvements. Patient will benefit from continued physical  therapy in hospital and recommended venue below to increase strength, balance, endurance for safe ADLs and gait.   Follow Up Recommendations  SNF;Supervision for mobility/OOB;Supervision - Intermittent;Home health PT     Equipment Recommendations  None recommended by PT    Recommendations for Other Services OT consult(LUE neglect)     Precautions / Restrictions      Mobility  Bed Mobility Overal bed mobility: Needs Assistance Bed Mobility: Sit to Supine       Sit to supine: Min assist   General bed mobility comments: min assist for trunk control and for LUE neglect with transfer to prevent lying ontop of  Transfers Overall transfer level: Needs assistance Equipment used: Quad cane Transfers: Sit to/from [facility] Sit to Stand: Min guard Stand pivot transfers: Min guard       General transfer comment: slow movement; from chair using quad cane, from toilet using grab bar  Ambulation/Gait Ambulation/Gait assistance: Min guard Gait Distance (Feet): 40 Feet Assistive device: Quad cane Gait Pattern/deviations: Step-through pattern;Decreased step length - right;Decreased step length - left;Decreased stride length Gait velocity: decreased   General Gait Details: slow, labored movement, occaisonal unsteadiness but able to maintain balance usign R quad cane without physical assistance, neglect LUE requiring support/weightbearing of LUE by therapist   Stairs             Wheelchair Mobility    Modified Rankin (Stroke Patients Only)       Balance Overall balance assessment: Needs assistance Sitting-balance support: Feet supported;No upper extremity supported Sitting balance-Leahy Scale: Fair Sitting balance - Comments: seated EOB   Standing balance support: During functional activity;Single extremity supported Standing balance-Leahy Scale: Fair Standing balance comment: with quad  cane                            Cognition  Arousal/Alertness: Awake/alert Behavior During Therapy: WFL for tasks assessed/performed Overall Cognitive Status: Within Functional Limits for tasks assessed                                        Exercises      General Comments        Pertinent Vitals/Pain Pain Assessment: No/denies pain(L UE "soreness/pain when touched the wrong way") Pain Intervention(s): Limited activity within patient's tolerance;Monitored during session;Repositioned(placed pillow under LUE)    Home Living                      Prior Function            PT Goals (current goals can now be found in the care plan section) Acute Rehab PT Goals Patient Stated Goal: return home with family to assist PT Goal Formulation: With patient Time For Goal Achievement: 01/07/19 Potential to Achieve Goals: Good Progress towards PT goals: Progressing toward goals    Frequency    7X/week      PT Plan Current plan remains appropriate    Co-evaluation              AM-PAC PT "6 Clicks" Mobility   Outcome Measure  Help needed turning from your back to your side while in a flat bed without using bedrails?: A Little Help needed moving from lying on your back to sitting on the side of a flat bed without using bedrails?: A Little Help needed moving to and from a bed to a chair (including a wheelchair)?: A Little Help needed standing up from a chair using your arms (e.g., wheelchair or bedside chair)?: A Little Help needed to walk in hospital room?: A Little Help needed climbing 3-5 steps with a railing? : A Lot 6 Click Score: 17    End of Session Equipment Utilized During Treatment: Gait belt Activity Tolerance: Patient tolerated treatment well;Patient limited by fatigue Patient left: in bed;with call bell/phone within reach Nurse Communication: Mobility status PT Visit Diagnosis: Unsteadiness on feet (R26.81);Other abnormalities of gait and mobility (R26.89);Muscle weakness  (generalized) (M62.81)     Time: 1594-7076 PT Time Calculation (min) (ACUTE ONLY): 17 min  Charges:  $Gait Training: 8-22 mins                     Tori Elayne Gruver PT, DPT 12/26/18, 9:24 AM [phone]

## 2018-12-26 NOTE — Progress Notes (Signed)
Patient has loss of IV access. Patient refused attempt to obtain IV access. MD has been notified and received verbal order to discontinue IV fluids and IV.

## 2018-12-27 ENCOUNTER — Emergency Department (HOSPITAL_COMMUNITY)
Admission: EM | Admit: 2018-12-27 | Discharge: 2018-12-27 | Disposition: A | Discharge status: Home / Self Care | Payer: Medicare Other | Source: Home / Self Care | Attending: Emergency Medicine | Admitting: Emergency Medicine

## 2018-12-27 ENCOUNTER — Encounter (HOSPITAL_COMMUNITY): Payer: Self-pay

## 2018-12-27 ENCOUNTER — Other Ambulatory Visit: Payer: Self-pay

## 2018-12-27 DIAGNOSIS — I639 Cerebral infarction, unspecified: Secondary | ICD-10-CM

## 2018-12-27 DIAGNOSIS — R531 Weakness: Secondary | ICD-10-CM | POA: Diagnosis not present

## 2018-12-27 DIAGNOSIS — I63511 Cerebral infarction due to unspecified occlusion or stenosis of right middle cerebral artery: Secondary | ICD-10-CM | POA: Diagnosis not present

## 2018-12-27 LAB — GLUCOSE, CAPILLARY: Glucose-Capillary: 274 mg/dL — ABNORMAL HIGH (ref 70–99)

## 2018-12-27 MED ORDER — ASPIRIN 325 MG PO TABS: 325.0000 mg | ORAL_TABLET | Freq: Every day | ORAL | 12 refills | Status: DC

## 2018-12-27 MED ORDER — AMLODIPINE BESYLATE 5 MG PO TABS: 5.0000 mg | ORAL_TABLET | Freq: Every day | ORAL | 11 refills | Status: DC

## 2018-12-27 MED ORDER — GUAIFENESIN ER 600 MG PO TB12: 1200.0000 mg | ORAL_TABLET | Freq: Two times a day (BID) | ORAL | 1 refills | Status: DC

## 2018-12-27 NOTE — Evaluation (Signed)
Occupational Therapy Evaluation Patient Details Name: Danielle [name] MRN: [id number] DOB: 1932/03/10 Today's Date: 12/27/2018    History of Present Illness Patient is a 85 y/o female with history of CVA presented with left UE weakness to the ED. MRI was completed on 12/23/18 showing multiple small acute right MCA infarcts.   Clinical Impression   Pt up in recliner with husband present and agreeable to participate in OT evaluation. Pt presents with minimal muscle activation in her LUE although able to demonstrate some in her shoulder and tricep. Discussed proper positioning of LUE due to neglect and edema. Patient presents with decreased strength, coordination, functional use of her LUE, and increased swelling resulting in difficulty completing daily tasks without increased assistance. Patient will benefit from OT services acutely until discharge home with home health services. Patient and spouse verbalize understanding.     Follow Up Recommendations  Home health OT          Precautions / Restrictions Precautions Precautions: Fall Precaution Comments: LUE neglect, left hemiparesis. Restrictions Weight Bearing Restrictions: No      Mobility Bed Mobility Overal bed mobility: (Pt up in chair)    Transfers Overall transfer level: Needs assistance Equipment used: Quad cane Transfers: Sit to/from [facility] Sit to Stand: Min assist Stand pivot transfers: Min assist                ADL either performed or assessed with clinical judgement   ADL Overall ADL's : Needs assistance/impaired Eating/Feeding: Set up;Sitting Eating/Feeding Details (indicate cue type and reason): due to left side weakness Grooming: Set up;Sitting   Upper Body Bathing: Minimal assistance;Sitting   Lower Body Bathing: Minimal assistance;Sit to/from stand;Sitting/lateral leans   Upper Body Dressing : Total assistance;Sitting   Lower Body Dressing: Minimal assistance;Sit to/from  stand;Sitting/lateral leans   Toilet Transfer: Minimal assistance;BSC   Toileting- Clothing Manipulation and Hygiene: Minimal assistance;Sit to/from stand;Sitting/lateral lean               Vision Baseline Vision/History: No visual deficits Patient Visual Report: No change from baseline              Pertinent Vitals/Pain Pain Assessment: No/denies pain(Reports increased sensitivity to left forearm when touched)     Hand Dominance Right   Extremity/Trunk Assessment Upper Extremity Assessment Upper Extremity Assessment: LUE deficits/detail LUE Deficits / Details: Scapular elevation and retraction: 3-/5. Elbow tricep: 1/5. Patient is able to complete shoulder shrugs and scapular pinches through partial ROM. No active movement demonstrated in shoulder, elbow, wrist or hand. Patient presents with pitting edema in left hand and forearm due to lack of movement. LUE Sensation: WNL(Per patient report) LUE Coordination: decreased fine motor;decreased gross motor   Lower Extremity Assessment Lower Extremity Assessment: Defer to PT evaluation       Communication Communication Communication: No difficulties   Cognition Arousal/Alertness: Awake/alert Behavior During Therapy: WFL for tasks assessed/performed Overall Cognitive Status: Within Functional Limits for tasks assessed     General Comments     Exercises Other Exercises Other Exercises: Education provided to patient and spouse regarding proper positioning of LUE while sitting up in recliner and when in bed. Discussed completing shoulder shrugs, scapular squeezes, and weightbearing into elbow when seated throughout the day. Patient and husband verbalized understanding.   Shoulder Instructions Shoulder Instructions Positioning of UE while sleeping: Minimal assistance;Caregiver independent with task    Home Living Family/patient expects to be discharged to:: Private residence Living Arrangements: Spouse/significant  other Available Help at Discharge: Family;Available  24 hours/day Type of Home: House Home Access: Stairs to enter [employer] of Steps: 2 Entrance Stairs-Rails: Right Home Layout: Two level;Able to live on main level with bedroom/bathroom Alternate Level Stairs-Number of Steps: 12 + 5 steps to go upstairs Alternate Level Stairs-Rails: Left Bathroom Shower/Tub: Tub/shower unit;Curtain   Bathroom Toilet: Handicapped height Bathroom Accessibility: Yes   Home Equipment: Environmental consultant - 2 wheels;Cane - single point;Bedside commode;Grab bars - tub/shower;Wheelchair - manual;Shower seat          Prior Functioning/Environment Level of Independence: Independent with assistive device(s)        Comments: household and short distanced community ambulator with cane        OT Problem List: Decreased strength;Decreased coordination;Increased edema;Decreased range of motion;Impaired UE functional use      OT Treatment/Interventions: Self-care/ADL training;Splinting;Therapeutic activities;Therapeutic exercise;Neuromuscular education;DME and/or AE instruction;Visual/perceptual remediation/compensation;Patient/family education;Manual therapy;Modalities;Balance training    OT Goals(Current goals can be found in the care plan section) Acute Rehab OT Goals Patient Stated Goal: return home with husband. Start Home health therapy OT Goal Formulation: With patient Time For Goal Achievement: 01/10/19 Potential to Achieve Goals: Good  OT Frequency: Min 2X/week   Barriers to D/C:    None          AM-PAC OT "6 Clicks" Daily Activity     Outcome Measure Help from another person eating meals?: A Little Help from another person taking care of personal grooming?: A Little Help from another person toileting, which includes using toliet, bedpan, or urinal?: A Little Help from another person bathing (including washing, rinsing, drying)?: A Little Help from another person to put on and taking off  regular upper body clothing?: A Little Help from another person to put on and taking off regular lower body clothing?: Total 6 Click Score: 16   End of Session Equipment Utilized During Treatment: Gait belt;Other (comment)(quad cane)  Activity Tolerance: Patient tolerated treatment well Patient left: in chair;with call bell/phone within reach;with family/visitor present  OT Visit Diagnosis: Muscle weakness (generalized) (M62.81)                Time: 2119-4174 OT Time Calculation (min): 11 min Charges:  OT General Charges $OT Visit: 1 Visit OT Evaluation $OT Eval Moderate Complexity: [place], OTR/L,CBIS  [phone]   Arnecia Ector, Clarene Duke 12/27/2018, 8:52 AM

## 2018-12-27 NOTE — Progress Notes (Signed)
Patient up to restroom several times using 4 point cane and assistance from RN/CNA,  Patient forgets to move left arm at times getting extremity caught under her.

## 2018-12-27 NOTE — TOC Transition Note (Signed)
Transition of Care [facility]) - CM/SW Discharge Note   Patient Details  Name: Danielle [name] MRN: [id number] Date of Birth: 14-Feb-1932  [facility] CM/SW Contact:  Trish Mage, LCSW Phone Number: 12/27/2018, 10:41 AM   Clinical Narrative:   Pt to d/c home today.  Confirmed with Vaughan Basta with [facility] that patient will have face-to-face for OT and nursing.  No further needs identified.    Final next level of care: [place] Barriers to Discharge: Barriers Resolved   Patient Goals and CMS Choice Patient states their goals for this hospitalization and ongoing recovery are:: to return home under the supervision of her husband      Discharge Placement                       Discharge Plan and Services In-house Referral: Clinical Social Work                        HH Arranged: PT [place] Agency: [place] ([place]) Date [place]: 12/24/18 Time [place]: 0623 Representative spoke with at [place]: [place] Determinants of Health ([place]) Interventions     Readmission Risk Interventions No flowsheet data found.

## 2018-12-27 NOTE — Progress Notes (Signed)
Inpatient Diabetes Program Recommendations  AACE/ADA: New Consensus Statement on Inpatient Glycemic Control (2015)  Target Ranges:  Prepandial:   less than 140 mg/dL      Peak postprandial:   less than 180 mg/dL (1-2 hours)      Critically ill patients:  140 - 180 mg/dL   Lab Results  Component Value Date   GLUCAP 274 (H) 12/27/2018   HGBA1C 10.3 (H) 12/23/2018    Review of Glycemic Control  Diabetes history: DM2 Outpatient Diabetes medications: Toujeo 20-22 units QAM, Humalog 12 units once. Current orders for Inpatient glycemic control: lantus 15 QAM, Novolog 0-9 units tidwc  Inpatient Diabetes Program Recommendations:     Increase Lantus to 20 units QD  Will continue to follow.  Thank you. Lorenda Peck, RD, LDN, CDE Inpatient Diabetes Coordinator [phone]

## 2018-12-27 NOTE — Discharge Summary (Signed)
Physician Discharge Summary  Patient ID: CATRENA VARI MRN: [id number] DOB/AGE: [birth year] 83 y.o. Primary Care Physician:Tanairi Cypert, Percell Miller, MD Admit date: 12/23/2018 Discharge date: 12/27/2018    Discharge Diagnoses:   Active Problems:   Uncontrolled type 2 diabetes mellitus with diabetic nephropathy, with long-term current use of insulin (HCC)   Acute CVA (cerebrovascular accident) ([place])   Hypothyroidism (acquired)   Hypertension   Stroke-like symptoms   Stroke [facility])   Allergies as of 12/27/2018      Reactions   Fosamax [alendronate Sodium] Other (See Comments)   MUSCLE ACHES   Prednisone    Feels jittery and nauseous      Medication List    STOP taking these medications   aspirin EC 81 MG tablet Replaced by: aspirin 325 MG tablet     TAKE these medications   amLODipine 5 MG tablet Commonly known as: NORVASC Take 1 tablet (5 mg total) by mouth daily.   aspirin 325 MG tablet Take 1 tablet (325 mg total) by mouth daily. Start taking on: December 28, 2018 Replaces: aspirin EC 81 MG tablet   atorvastatin 40 MG tablet Commonly known as: LIPITOR Take 1 tablet (40 mg total) by mouth daily at 6 PM.   CALCIUM 600 + D PO Take 1 tablet by mouth daily.   Fish Oil 1000 MG Caps Take 1,000 mg by mouth daily.   guaiFENesin 600 MG 12 hr tablet Commonly known as: MUCINEX Take 2 tablets (1,200 mg total) by mouth 2 (two) times daily. What changed:   how much to take  when to take this   insulin lispro 100 UNIT/ML injection Commonly known as: HUMALOG Inject 12 Units into the skin once.   multivitamin with minerals Tabs tablet Take 1 tablet by mouth daily.   multivitamin-lutein Caps capsule Take 1 capsule by mouth daily.   Synthroid 100 MCG tablet Generic drug: levothyroxine Take 100 mcg by mouth every morning.   Toujeo SoloStar 300 UNIT/ML Sopn Generic drug: Insulin Glargine (1 Unit Dial) Inject 20-22 Units into the skin every morning.   vitamin B-12 1000  MCG tablet Commonly known as: CYANOCOBALAMIN Take 1,000 mcg by mouth daily.       Discharged Condition: Improved    Consults: None  Significant Diagnostic Studies: Ct Head Wo Contrast  Result Date: 12/23/2018 CLINICAL DATA:  Left upper extremity weakness EXAM: CT HEAD WITHOUT CONTRAST TECHNIQUE: Contiguous axial images were obtained from the base of the skull through the vertex without intravenous contrast. COMPARISON:  09/27/2018 FINDINGS: Brain: Redemonstration of a mass arising from the anterior falx on the left measuring approximately 2.6 x 2.8 cm, stable in size compared to prior. Internal focus of low-density likely internal cystic change, also stable from prior. Mass results in mild mass effect on the anterior horn of the left lateral ventricle. No appreciable surrounding edema. Encephalomalacia in the right frontal lobe from prior right MCA distribution infarct. Additional areas of encephalomalacia in the right basal ganglia from prior infarcts, also unchanged. No evidence of new infarct, hemorrhage, or extra-axial fluid collection. Scattered low-density changes within the periventricular and subcortical white matter compatible with chronic microvascular ischemic change. Ventricular size and configuration is unchanged. Vascular: Atherosclerotic calcifications involving the large vessels of the skull base. No unexpected hyperdense vessel. Skull: Normal. Negative for fracture or focal lesion. Sinuses/Orbits: No acute finding. Other: None. IMPRESSION: 1. No evidence of an acute intracranial abnormality. 2. Stable size of left anterior parafalcine meningioma. 3. Chronic microvascular ischemic changes as well as prior  right MCA distribution infarct and prior right basal ganglia lacunar infarcts. Electronically Signed   By: Davina Poke M.D.   On: 12/23/2018 12:02   Mr Angio Head Wo Contrast  Result Date: 12/23/2018 CLINICAL DATA:  Left arm weakness. EXAM: MRI HEAD WITHOUT CONTRAST MRA HEAD  WITHOUT CONTRAST TECHNIQUE: Multiplanar, multiecho pulse sequences of the brain and surrounding structures were obtained without intravenous contrast. Angiographic images of the head were obtained using MRA technique without contrast. COMPARISON:  Head CT 12/23/2018.  Head MRI and MRA 05/03/2018. FINDINGS: MRI HEAD FINDINGS Brain: There are multiple small foci of restricted diffusion in the right MCA territory consistent with acute infarcts primarily involving cortex of the right frontal and parietal lobes. A chronic right MCA infarct is again noted involving the insula, basal ganglia, and frontal lobe with ex vacuo dilatation of the right frontal horn and hemosiderin staining. Scattered T2 hyperintensities elsewhere in the cerebral white matter bilaterally are similar to the prior MRI and nonspecific but compatible with mild chronic small vessel ischemic disease. An extra-axial mass anteriorly along the falx on the left has not significantly changed in size from the prior MRI and measures 2.2 x 2.8 x 3.7 cm. There is local mass effect on the left frontal lobe without edema. Mild generalized cerebral atrophy is not greater than expected for age. Vascular: Chronic right ICA occlusion. Skull and upper cervical spine: Unremarkable bone marrow signal. Sinuses/Orbits: Bilateral cataract extraction. Paranasal sinuses and mastoid air cells are clear. Other: None. MRA HEAD FINDINGS The visualized distal vertebral arteries are patent to the basilar with the left being dominant. The basilar artery is patent with minimal narrowing distally. There is a large left posterior communicating artery. Severe bilateral P2 stenoses are again noted. There is chronic occlusion of the right ICA with distal reconstitution near the terminus. The right M1 and proximal M2 segments are poorly visualized, more so than on the prior MRA and suggesting severely reduced flow. There is some reconstitution of more robust flow related enhancement in  the more distal right MCA branches. No significant flow related enhancement is apparent today in the right A1 segment which may reflect further reduced flow or interval occlusion. The right A2 segment is patent and supplied by the left ACA via the anterior communicating artery. The left intracranial ICA is widely patent. The left ACA and left MCA are patent without evidence of proximal branch occlusion or significant proximal stenosis. No aneurysm is identified. IMPRESSION: 1. Multiple small acute right MCA infarcts. 2. Chronic right MCA infarct. 3. 3.7 cm left anterior parafalcine meningioma, not significantly changed in size from 2019 and without edema. 4. 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 Severely diminished flow in the proximal right MCA and possible interval occlusion of the right A1 segment. Electronically Signed   By: Logan Bores M.D.   On: 12/23/2018 14:57   Mr Brain Wo Contrast  Result Date: 12/23/2018 CLINICAL DATA:  Left arm weakness. EXAM: MRI HEAD WITHOUT CONTRAST MRA HEAD WITHOUT CONTRAST TECHNIQUE: Multiplanar, multiecho pulse sequences of the brain and surrounding structures were obtained without intravenous contrast. Angiographic images of the head were obtained using MRA technique without contrast. COMPARISON:  Head CT 12/23/2018.  Head MRI and MRA 05/03/2018. FINDINGS: MRI HEAD FINDINGS Brain: There are multiple small foci of restricted diffusion in the right MCA territory consistent with acute infarcts primarily involving cortex of the right frontal and parietal lobes. A chronic right MCA infarct is again noted involving  the insula, basal ganglia, and frontal lobe with ex vacuo dilatation of the right frontal horn and hemosiderin staining. Scattered T2 hyperintensities elsewhere in the cerebral white matter bilaterally are similar to the prior MRI and nonspecific but compatible with mild chronic small vessel ischemic disease. An extra-axial mass  anteriorly along the falx on the left has not significantly changed in size from the prior MRI and measures 2.2 x 2.8 x 3.7 cm. There is local mass effect on the left frontal lobe without edema. Mild generalized cerebral atrophy is not greater than expected for age. Vascular: Chronic right ICA occlusion. Skull and upper cervical spine: Unremarkable bone marrow signal. Sinuses/Orbits: Bilateral cataract extraction. Paranasal sinuses and mastoid air cells are clear. Other: None. MRA HEAD FINDINGS The visualized distal vertebral arteries are patent to the basilar with the left being dominant. The basilar artery is patent with minimal narrowing distally. There is a large left posterior communicating artery. Severe bilateral P2 stenoses are again noted. There is chronic occlusion of the right ICA with distal reconstitution near the terminus. The right M1 and proximal M2 segments are poorly visualized, more so than on the prior MRA and suggesting severely reduced flow. There is some reconstitution of more robust flow related enhancement in the more distal right MCA branches. No significant flow related enhancement is apparent today in the right A1 segment which may reflect further reduced flow or interval occlusion. The right A2 segment is patent and supplied by the left ACA via the anterior communicating artery. The left intracranial ICA is widely patent. The left ACA and left MCA are patent without evidence of proximal branch occlusion or significant proximal stenosis. No aneurysm is identified. IMPRESSION: 1. Multiple small acute right MCA infarcts. 2. Chronic right MCA infarct. 3. 3.7 cm left anterior parafalcine meningioma, not significantly changed in size from 2019 and without edema. 4. 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 Severely diminished flow in the proximal right MCA and possible interval occlusion of the right A1 segment. Electronically Signed   By: Logan Bores M.D.    On: 12/23/2018 14:57   US Carotid Bilateral (at [facility] Only)  Result Date: 12/23/2018 CLINICAL DATA:  Small acute right MCA territory infarcts. History of chronic right ICA occlusion EXAM: BILATERAL CAROTID DUPLEX ULTRASOUND TECHNIQUE: Pearline Cables scale imaging, color Doppler and duplex ultrasound were performed of bilateral carotid and vertebral arteries in the neck. COMPARISON:  12/23/2018 MRI FINDINGS: Criteria: Quantification of carotid stenosis is based on velocity parameters that correlate the residual internal carotid diameter with NASCET-based stenosis levels, using the diameter of the distal internal carotid lumen as the denominator for stenosis measurement. The following velocity measurements were obtained: RIGHT ICA: Occluded cm/sec CCA: 33/4 cm/sec ECA: 283 cm/sec LEFT ICA: 174/44 cm/sec CCA: 99/24 cm/sec SYSTOLIC ICA/CCA RATIO:  3.7 ECA: 114 cm/sec RIGHT CAROTID ARTERY: Extensive right carotid atherosclerosis. CCA and ECA remain patent. Chronic occlusion of the right ICA as before. RIGHT VERTEBRAL ARTERY:  Antegrade LEFT CAROTID ARTERY: Moderate heterogeneous calcified atherosclerosis. Slight velocity elevation measuring 174/44 centimeters/second. Mild turbulent flow and spectral broadening. Left ICA stenosis estimated at 50-69% by ultrasound criteria. LEFT VERTEBRAL ARTERY:  Antegrade IMPRESSION: Chronically occluded right ICA as before. Moderate left ICA atherosclerosis. Left ICA stenosis estimated at 50-69% Patent antegrade vertebral flow bilaterally Electronically Signed   By: Jerilynn Mages.  Shick M.D.   On: 12/23/2018 15:28    Lab Results: Basic Metabolic Panel: No results for input(s): NA, K, CL, CO2,  GLUCOSE, BUN, CREATININE, CALCIUM, MG, PHOS in the last 72 hours. Liver Function Tests: No results for input(s): AST, ALT, ALKPHOS, BILITOT, PROT, ALBUMIN in the last 72 hours.   CBC: No results for input(s): WBC, NEUTROABS, HGB, HCT, MCV, PLT in the last 72 hours.  Recent Results (from the past  240 hour(s))  SARS CORONAVIRUS 2 Nasal Swab Aptima Multi Swab     Status: None   Collection Time: 12/23/18  9:57 AM   Specimen: Aptima Multi Swab; Nasal Swab  Result Value Ref Range Status   SARS Coronavirus 2 NEGATIVE NEGATIVE Final    Comment: (NOTE) SARS-CoV-2 target nucleic acids are NOT DETECTED. The SARS-CoV-2 RNA is generally detectable in upper and lower respiratory specimens during the acute phase of infection. Negative results do not preclude SARS-CoV-2 infection, do not rule out co-infections with other pathogens, and should not be used as the sole basis for treatment or other patient management decisions. Negative results must be combined with clinical observations, patient history, and epidemiological information. The expected result is Negative. Fact Sheet for Patients: SugarRoll.be Fact Sheet for Healthcare Providers: https://www.woods-mathews.com/ This test is not yet approved or cleared by the [place] FDA and  has been authorized for detection and/or diagnosis of SARS-CoV-2 by FDA under an Emergency Use Authorization (EUA). This EUA will remain  in effect (meaning this test can be used) for the duration of the COVID-19 declaration under Section 56 4(b)(1) of the Act, 21 U.S.C. section 360bbb-3(b)(1), unless the authorization is terminated or revoked sooner. Performed at [facility], [place] [address] [place], [place] [address]      Hospital Course: This is an 85 year old who came to the hospital with loss of use of her left arm.  This was fairly sudden.  She had MRI that showed multiple previous CVA.  Neurology consultation was done in the emergency department and follow-up consultation was requested but not available.  She had an episode of confusion and she had EEG which did not show seizure-like activity.  She improved over the next several days still had neglect of her left arm.  It was recommended that she go to  skilled care facility but she does not want to do that.  She is going to go home with home health services.  Discharge Exam: Blood pressure (!) 186/85, pulse 80, temperature 98.3 F (36.8 C), temperature source Oral, resp. rate 17, height 5\' 5"  (1.651 m), weight 59 kg, SpO2 98 %. She still has flaccid left arm with neglect  Disposition: Home with home health services.  I will have her do an event monitor when I see her in the office.  She does need to follow-up with neurology.      Signed: Alonza Bogus   12/27/2018, 9:01 AM

## 2018-12-27 NOTE — Plan of Care (Signed)
  Problem: Acute Rehab OT Goals (only OT should resolve) Goal: Pt. Will Perform Grooming Flowsheets (Taken 12/27/2018 0855) Pt Will Perform Grooming:  with modified independence  standing Goal: Pt. Will Perform Upper Body Bathing Flowsheets (Taken 12/27/2018 0855) Pt Will Perform Upper Body Bathing:  with modified independence  sitting Goal: Pt. Will Perform Upper Body Dressing Flowsheets (Taken 12/27/2018 0855) Pt Will Perform Upper Body Dressing:  with modified independence  sitting Goal: Pt. Will Transfer To Toilet Flowsheets (Taken 12/27/2018 [id number]) Pt Will Transfer to Toilet:  with supervision  ambulating  regular height toilet Goal: Pt. Will Perform Toileting-Clothing Manipulation Flowsheets (Taken 12/27/2018 0855) Pt Will Perform Toileting - Clothing Manipulation and hygiene:  with min guard assist  sit to/from stand  sitting/lateral leans Goal: Pt/Caregiver Will Perform Home Exercise Program Flowsheets (Taken 12/27/2018 [id number]) Pt/caregiver will Perform Home Exercise Program:  Increased ROM  Increased strength  Left upper extremity  With minimal assist  With written HEP provided Goal: OT Additional ADL Goal #1 Flowsheets (Taken 12/27/2018 0855) Additional ADL Goal #1: Pt will verbalize and demonstrate understanding of LUE positioning while seated in chair and in bed to decrease swelling and increase her left side awareness.

## 2018-12-27 NOTE — Discharge Instructions (Signed)
Your weakness is going to take time to improve. You need consistent work with physical therapy. If you are not progressing the way you'd like at home then you need to speak with Dr Luan Pulling about possible placement in a rehabilitation facility.

## 2018-12-27 NOTE — Progress Notes (Signed)
Subjective: She says she feels much better and wants to go home.  Her husband is with her and he says he thinks they can manage at home rather than going to the skilled care facility  Objective: Vital signs in last 24 hours: Temp:  [98 F (36.7 C)-98.3 F (36.8 C)] 98.3 F (36.8 C) (08/10 0545) Pulse Rate:  [68-80] 80 (08/10 0545) Resp:  [16-18] 17 (08/09 2116) BP: (153-190)/(51-92) 186/85 (08/10 0545) SpO2:  [96 %-100 %] 98 % (08/10 0545) Weight change:  Last BM Date: 12/24/18  Intake/Output from previous day: No intake/output data recorded.  PHYSICAL EXAM General appearance: alert, cooperative and no distress Resp: clear to auscultation bilaterally Cardio: regular rate and rhythm, S1, S2 normal, no murmur, click, rub or gallop GI: soft, non-tender; bowel sounds normal; no masses,  no organomegaly Extremities: extremities normal, atraumatic, no cyanosis or edema  Lab Results:  Results for orders placed or performed during the hospital encounter of 12/23/18 (from the past 48 hour(s))  Glucose, capillary     Status: Abnormal   Collection Time: 12/25/18 11:09 AM  Result Value Ref Range   Glucose-Capillary 222 (H) 70 - 99 mg/dL  Glucose, capillary     Status: Abnormal   Collection Time: 12/25/18  4:01 PM  Result Value Ref Range   Glucose-Capillary 249 (H) 70 - 99 mg/dL  Glucose, capillary     Status: Abnormal   Collection Time: 12/25/18  9:34 PM  Result Value Ref Range   Glucose-Capillary 169 (H) 70 - 99 mg/dL  Glucose, capillary     Status: Abnormal   Collection Time: 12/26/18  7:38 AM  Result Value Ref Range   Glucose-Capillary 129 (H) 70 - 99 mg/dL  Glucose, capillary     Status: Abnormal   Collection Time: 12/26/18 11:52 AM  Result Value Ref Range   Glucose-Capillary 343 (H) 70 - 99 mg/dL  Glucose, capillary     Status: Abnormal   Collection Time: 12/26/18  4:09 PM  Result Value Ref Range   Glucose-Capillary 194 (H) 70 - 99 mg/dL  Glucose, capillary     Status:  Abnormal   Collection Time: 12/26/18  9:17 PM  Result Value Ref Range   Glucose-Capillary 294 (H) 70 - 99 mg/dL  Glucose, capillary     Status: Abnormal   Collection Time: 12/27/18  8:13 AM  Result Value Ref Range   Glucose-Capillary 274 (H) 70 - 99 mg/dL   Comment 1 Document in Chart     ABGS Recent Labs    12/24/18 1445  PHART 7.453*  PO2ART 85.0  HCO3 25.2   CULTURES Recent Results (from the past 240 hour(s))  SARS CORONAVIRUS 2 Nasal Swab Aptima Multi Swab     Status: None   Collection Time: 12/23/18  9:57 AM   Specimen: Aptima Multi Swab; Nasal Swab  Result Value Ref Range Status   SARS Coronavirus 2 NEGATIVE NEGATIVE Final    Comment: (NOTE) SARS-CoV-2 target nucleic acids are NOT DETECTED. The SARS-CoV-2 RNA is generally detectable in upper and lower respiratory specimens during the acute phase of infection. Negative results do not preclude SARS-CoV-2 infection, do not rule out co-infections with other pathogens, and should not be used as the sole basis for treatment or other patient management decisions. Negative results must be combined with clinical observations, patient history, and epidemiological information. The expected result is Negative. Fact Sheet for Patients: SugarRoll.be Fact Sheet for Healthcare Providers: https://www.woods-mathews.com/ This test is not yet approved or cleared by  the [employer] and  has been authorized for detection and/or diagnosis of SARS-CoV-2 by FDA under an Emergency Use Authorization (EUA). This EUA will remain  in effect (meaning this test can be used) for the duration of the COVID-19 declaration under Section 56 4(b)(1) of the Act, 21 U.S.C. section 360bbb-3(b)(1), unless the authorization is terminated or revoked sooner. Performed at [facility], [place] [address], [place], [place] [address]    Studies/Results: No results found.  Medications:  Prior to Admission:   Medications Prior to Admission  Medication Sig Dispense Refill Last Dose  . aspirin EC 81 MG tablet Take 162 mg by mouth every morning.    12/23/2018 at Unknown time  . atorvastatin (LIPITOR) 40 MG tablet Take 1 tablet (40 mg total) by mouth daily at 6 PM. 30 tablet 0 12/22/2018 at Unknown time  . Calcium Carb-Cholecalciferol (CALCIUM 600 + D PO) Take 1 tablet by mouth daily.   12/22/2018 at Unknown time  . guaiFENesin (MUCINEX) 600 MG 12 hr tablet Take 2 tablets (1,200 mg total) by mouth 2 (two) times daily. (Patient taking differently: Take 600 mg by mouth daily. ) 30 tablet 1 12/22/2018 at Unknown time  . insulin lispro (HUMALOG) 100 UNIT/ML injection Inject 12 Units into the skin once.    12/22/2018 at 1000am  . Multiple Vitamin (MULTIVITAMIN WITH MINERALS) TABS Take 1 tablet by mouth daily.    12/22/2018 at Unknown time  . multivitamin-lutein (OCUVITE-LUTEIN) CAPS capsule Take 1 capsule by mouth daily.   12/22/2018 at Unknown time  . Omega-3 Fatty Acids (FISH OIL) 1000 MG CAPS Take 1,000 mg by mouth daily.   12/22/2018 at Unknown time  . SYNTHROID 100 MCG tablet Take 100 mcg by mouth every morning.    12/22/2018 at Unknown time  . TOUJEO SOLOSTAR 300 UNIT/ML SOPN Inject 20-22 Units into the skin every morning.    12/22/2018 at 1000am  . vitamin B-12 (CYANOCOBALAMIN) 1000 MCG tablet Take 1,000 mcg by mouth daily.   12/22/2018 at Unknown time   Scheduled: . aspirin  300 mg Rectal Daily   Or  . aspirin  325 mg Oral Daily  . atorvastatin  40 mg Oral q1800  . guaiFENesin  600 mg Oral Daily  . heparin  5,000 Units Subcutaneous Q8H  . insulin aspart  0-9 Units Subcutaneous TID WC  . insulin glargine  15 Units Subcutaneous q morning - 10a  . levothyroxine  100 mcg Oral q morning - 10a  . multivitamin with minerals  1 tablet Oral Daily  . omega-3 acid ethyl esters  1 g Oral Daily  . vitamin B-12  1,000 mcg Oral Daily   Continuous:  [id number] **OR** acetaminophen (TYLENOL) oral liquid 160 mg/5 mL **OR**  acetaminophen, senna-docusate  Assesment: She had a stroke.  She has left arm weakness and it is generally flaccid.  She also neglects her left arm.  She had previous stroke about 8 months ago.  She has hypertension which has not been controlled here but we have been doing the permissive hypertension.  However according to family it has been up at home also  She has diabetes which is under fair control  She had confusion and there was concern that she had a UTI but urinalysis was not done Active Problems:   Stroke-like symptoms   Stroke [facility])    Plan: Discharge home today    LOS: 3 days   Alonza Bogus 12/27/2018, 8:54 AM

## 2018-12-27 NOTE — Progress Notes (Signed)
Nsg Discharge Note  Admit Date:  12/23/2018 Discharge date: 12/27/2018   Danielle [name] A Mallon to be D/C'd Home  per MD order. Patient/caregiver able to verbalize understanding.  Discharge Medication: Allergies as of 12/27/2018      Reactions   Fosamax [alendronate Sodium] Other (See Comments)   MUSCLE ACHES   Prednisone    Feels jittery and nauseous      Medication List    STOP taking these medications   aspirin EC 81 MG tablet Replaced by: aspirin 325 MG tablet     TAKE these medications   amLODipine 5 MG tablet Commonly known as: NORVASC Take 1 tablet (5 mg total) by mouth daily.   aspirin 325 MG tablet Take 1 tablet (325 mg total) by mouth daily. Start taking on: December 28, 2018 Replaces: aspirin EC 81 MG tablet   atorvastatin 40 MG tablet Commonly known as: LIPITOR Take 1 tablet (40 mg total) by mouth daily at 6 PM.   CALCIUM 600 + D PO Take 1 tablet by mouth daily.   Fish Oil 1000 MG Caps Take 1,000 mg by mouth daily.   guaiFENesin 600 MG 12 hr tablet Commonly known as: MUCINEX Take 2 tablets (1,200 mg total) by mouth 2 (two) times daily. What changed:   how much to take  when to take this   insulin lispro 100 UNIT/ML injection Commonly known as: HUMALOG Inject 12 Units into the skin once.   multivitamin with minerals Tabs tablet Take 1 tablet by mouth daily.   multivitamin-lutein Caps capsule Take 1 capsule by mouth daily.   Synthroid 100 MCG tablet Generic drug: levothyroxine Take 100 mcg by mouth every morning.   Toujeo SoloStar 300 UNIT/ML Sopn Generic drug: Insulin Glargine (1 Unit Dial) Inject 20-22 Units into the skin every morning.   vitamin B-12 1000 MCG tablet Commonly known as: CYANOCOBALAMIN Take 1,000 mcg by mouth daily.       Discharge Assessment: Vitals:   12/27/18 0152 12/27/18 0545  BP: (!) 168/92 (!) 186/85  Pulse: 78 80  Resp:    Temp: 98.3 F (36.8 C) 98.3 F (36.8 C)  SpO2: 97% 98%   Skin clean, dry and intact  without evidence of skin break down, no evidence of skin tears noted. IV catheter discontinued intact. Site without signs and symptoms of complications - no redness or edema noted at insertion site, patient denies c/o pain - only slight tenderness at site.  Dressing with slight pressure applied.  D/c Instructions-Education: Discharge instructions given to patient's husband Danielle [name]  with verbalized understanding. D/c education completed with patient/family including follow up instructions, medication list, d/c activities limitations if indicated, with other d/c instructions as indicated by MD - patient able to verbalize understanding, all questions fully answered. Patient's husband Danielle [name]  instructed to return to ED, call 911, or call MD for any changes in patient's condition.  Patient escorted via [place], and D/C home via private auto.  Danielle Thelin Loletha Grayer, RN 12/27/2018 10:24 AM

## 2018-12-27 NOTE — ED Triage Notes (Signed)
Pt discharged from [facility] this AM, c/o left leg weakness. PT scheduled to begin tomorrow.

## 2018-12-29 ENCOUNTER — Inpatient Hospital Stay (HOSPITAL_COMMUNITY): Payer: Medicare Other

## 2018-12-29 ENCOUNTER — Encounter (HOSPITAL_COMMUNITY): Payer: Self-pay

## 2018-12-29 ENCOUNTER — Inpatient Hospital Stay (HOSPITAL_COMMUNITY)
Admission: AD | Admit: 2018-12-29 | Discharge: 2019-01-03 | DRG: 065 | Disposition: A | Discharge status: Rehabilitation Facility | Payer: Medicare Other | Source: Ambulatory Visit | Attending: Internal Medicine | Admitting: Internal Medicine

## 2018-12-29 ENCOUNTER — Other Ambulatory Visit: Payer: Self-pay

## 2018-12-29 DIAGNOSIS — Z87891 Personal history of nicotine dependence: Secondary | ICD-10-CM | POA: Diagnosis not present

## 2018-12-29 DIAGNOSIS — G4700 Insomnia, unspecified: Secondary | ICD-10-CM | POA: Diagnosis present

## 2018-12-29 DIAGNOSIS — G8194 Hemiplegia, unspecified affecting left nondominant side: Secondary | ICD-10-CM | POA: Diagnosis present

## 2018-12-29 DIAGNOSIS — Z794 Long term (current) use of insulin: Secondary | ICD-10-CM | POA: Diagnosis not present

## 2018-12-29 DIAGNOSIS — I63231 Cerebral infarction due to unspecified occlusion or stenosis of right carotid arteries: Secondary | ICD-10-CM

## 2018-12-29 DIAGNOSIS — Z20828 Contact with and (suspected) exposure to other viral communicable diseases: Secondary | ICD-10-CM | POA: Diagnosis present

## 2018-12-29 DIAGNOSIS — D320 Benign neoplasm of cerebral meninges: Secondary | ICD-10-CM | POA: Diagnosis present

## 2018-12-29 DIAGNOSIS — Z7982 Long term (current) use of aspirin: Secondary | ICD-10-CM | POA: Diagnosis not present

## 2018-12-29 DIAGNOSIS — I6522 Occlusion and stenosis of left carotid artery: Secondary | ICD-10-CM | POA: Diagnosis not present

## 2018-12-29 DIAGNOSIS — R471 Dysarthria and anarthria: Secondary | ICD-10-CM | POA: Diagnosis present

## 2018-12-29 DIAGNOSIS — E039 Hypothyroidism, unspecified: Secondary | ICD-10-CM | POA: Diagnosis present

## 2018-12-29 DIAGNOSIS — K5900 Constipation, unspecified: Secondary | ICD-10-CM | POA: Diagnosis present

## 2018-12-29 DIAGNOSIS — E1165 Type 2 diabetes mellitus with hyperglycemia: Secondary | ICD-10-CM | POA: Diagnosis present

## 2018-12-29 DIAGNOSIS — E1121 Type 2 diabetes mellitus with diabetic nephropathy: Secondary | ICD-10-CM | POA: Diagnosis not present

## 2018-12-29 DIAGNOSIS — R531 Weakness: Secondary | ICD-10-CM | POA: Diagnosis not present

## 2018-12-29 DIAGNOSIS — I6523 Occlusion and stenosis of bilateral carotid arteries: Secondary | ICD-10-CM

## 2018-12-29 DIAGNOSIS — Z79899 Other long term (current) drug therapy: Secondary | ICD-10-CM | POA: Diagnosis not present

## 2018-12-29 DIAGNOSIS — R260 Ataxic gait: Secondary | ICD-10-CM | POA: Diagnosis present

## 2018-12-29 DIAGNOSIS — I639 Cerebral infarction, unspecified: Secondary | ICD-10-CM | POA: Diagnosis not present

## 2018-12-29 DIAGNOSIS — I63511 Cerebral infarction due to unspecified occlusion or stenosis of right middle cerebral artery: Secondary | ICD-10-CM | POA: Diagnosis present

## 2018-12-29 DIAGNOSIS — Z8673 Personal history of transient ischemic attack (TIA), and cerebral infarction without residual deficits: Secondary | ICD-10-CM | POA: Diagnosis not present

## 2018-12-29 DIAGNOSIS — E785 Hyperlipidemia, unspecified: Secondary | ICD-10-CM | POA: Diagnosis present

## 2018-12-29 DIAGNOSIS — Z79890 Hormone replacement therapy (postmenopausal): Secondary | ICD-10-CM | POA: Diagnosis not present

## 2018-12-29 DIAGNOSIS — I6521 Occlusion and stenosis of right carotid artery: Secondary | ICD-10-CM | POA: Diagnosis not present

## 2018-12-29 DIAGNOSIS — Z888 Allergy status to other drugs, medicaments and biological substances status: Secondary | ICD-10-CM | POA: Diagnosis not present

## 2018-12-29 DIAGNOSIS — I10 Essential (primary) hypertension: Secondary | ICD-10-CM | POA: Diagnosis present

## 2018-12-29 DIAGNOSIS — E119 Type 2 diabetes mellitus without complications: Secondary | ICD-10-CM | POA: Diagnosis not present

## 2018-12-29 LAB — CBC WITH DIFFERENTIAL/PLATELET
Abs Immature Granulocytes: 0.01 10*3/uL (ref 0.00–0.07)
Basophils Absolute: 0.1 10*3/uL (ref 0.0–0.1)
Basophils Relative: 1 %
Eosinophils Absolute: 0.1 10*3/uL (ref 0.0–0.5)
Eosinophils Relative: 1 %
HCT: 37.4 % (ref 36.0–46.0)
Hemoglobin: 11.9 g/dL — ABNORMAL LOW (ref 12.0–15.0)
Immature Granulocytes: 0 %
Lymphocytes Relative: 24 %
Lymphs Abs: 1.4 10*3/uL (ref 0.7–4.0)
MCH: 29.8 pg (ref 26.0–34.0)
MCHC: 31.8 g/dL (ref 30.0–36.0)
MCV: 93.7 fL (ref 80.0–100.0)
Monocytes Absolute: 0.6 10*3/uL (ref 0.1–1.0)
Monocytes Relative: 10 %
Neutro Abs: 3.8 10*3/uL (ref 1.7–7.7)
Neutrophils Relative %: 64 %
Platelets: 298 10*3/uL (ref 150–400)
RBC: 3.99 MIL/uL (ref 3.87–5.11)
RDW: 13.9 % (ref 11.5–15.5)
WBC: 6 10*3/uL (ref 4.0–10.5)
nRBC: 0 % (ref 0.0–0.2)

## 2018-12-29 LAB — COMPREHENSIVE METABOLIC PANEL
ALT: 46 U/L — ABNORMAL HIGH (ref 0–44)
AST: 51 U/L — ABNORMAL HIGH (ref 15–41)
Albumin: 3.4 g/dL — ABNORMAL LOW (ref 3.5–5.0)
Alkaline Phosphatase: 117 U/L (ref 38–126)
Anion gap: 11 (ref 5–15)
BUN: 23 mg/dL (ref 8–23)
CO2: 23 mmol/L (ref 22–32)
Calcium: 9.1 mg/dL (ref 8.9–10.3)
Chloride: 100 mmol/L (ref 98–111)
Creatinine, Ser: 0.98 mg/dL (ref 0.44–1.00)
GFR calc Af Amer: >60 mL/min (ref >60)
GFR calc non Af Amer: 53 mL/min — ABNORMAL LOW (ref >60)
Glucose, Bld: 595 mg/dL (ref 70–99)
Potassium: 4.5 mmol/L (ref 3.5–5.1)
Sodium: 134 mmol/L — ABNORMAL LOW (ref 135–145)
Total Bilirubin: 0.8 mg/dL (ref 0.3–1.2)
Total Protein: 6.4 g/dL — ABNORMAL LOW (ref 6.5–8.1)

## 2018-12-29 LAB — NOVEL CORONAVIRUS, NAA: SARS-CoV-2, NAA: NEGATIVE

## 2018-12-29 LAB — GLUCOSE, CAPILLARY: Glucose-Capillary: 199 mg/dL — ABNORMAL HIGH (ref 70–99)

## 2018-12-29 LAB — TSH: TSH: 0.122 u[IU]/mL — ABNORMAL LOW (ref 0.350–4.500)

## 2018-12-29 LAB — SARS CORONAVIRUS 2 BY RT PCR (HOSPITAL ORDER, PERFORMED IN ~~LOC~~ HOSPITAL LAB): SARS Coronavirus 2: NEGATIVE

## 2018-12-29 MED ORDER — ASPIRIN 325 MG PO TABS
325.0000 mg | ORAL_TABLET | Freq: Every day | ORAL | Status: DC
  Administered 2018-12-29 – 2019-01-03 (×6): 325 mg via ORAL
  Filled 2018-12-29 (×6): qty 1

## 2018-12-29 MED ORDER — INSULIN ASPART 100 UNIT/ML ~~LOC~~ SOLN: 0.0000 [IU] | Freq: Every day | SUBCUTANEOUS | Status: DC

## 2018-12-29 MED ORDER — SODIUM CHLORIDE 0.9% FLUSH
3.0000 mL | Freq: Two times a day (BID) | INTRAVENOUS | Status: DC
  Administered 2018-12-29 – 2019-01-02 (×6): 3 mL via INTRAVENOUS

## 2018-12-29 MED ORDER — ACETAMINOPHEN 325 MG PO TABS
650.0000 mg | ORAL_TABLET | Freq: Four times a day (QID) | ORAL | Status: DC | PRN
  Administered 2018-12-31 – 2019-01-01 (×2): 650 mg via ORAL
  Filled 2018-12-29 (×2): qty 2

## 2018-12-29 MED ORDER — INSULIN ASPART 100 UNIT/ML ~~LOC~~ SOLN
0.0000 [IU] | Freq: Three times a day (TID) | SUBCUTANEOUS | Status: DC
  Administered 2018-12-29: 15 [IU] via SUBCUTANEOUS

## 2018-12-29 MED ORDER — ENOXAPARIN SODIUM 40 MG/0.4ML ~~LOC~~ SOLN
40.0000 mg | SUBCUTANEOUS | Status: DC
  Administered 2018-12-29 – 2019-01-03 (×6): 40 mg via SUBCUTANEOUS
  Filled 2018-12-29 (×6): qty 0.4

## 2018-12-29 MED ORDER — INSULIN DETEMIR 100 UNIT/ML ~~LOC~~ SOLN
20.0000 [IU] | Freq: Every day | SUBCUTANEOUS | Status: DC
  Administered 2018-12-29: 20 [IU] via SUBCUTANEOUS
  Filled 2018-12-29: qty 0.2

## 2018-12-29 MED ORDER — GADOBUTROL 1 MMOL/ML IV SOLN
6.0000 mL | Freq: Once | INTRAVENOUS | Status: AC | PRN
  Administered 2018-12-29: 6 mL via INTRAVENOUS

## 2018-12-29 MED ORDER — LEVOTHYROXINE SODIUM 100 MCG PO TABS
100.0000 ug | ORAL_TABLET | Freq: Every morning | ORAL | Status: DC
  Administered 2018-12-30 – 2019-01-03 (×5): 100 ug via ORAL
  Filled 2018-12-29 (×5): qty 1

## 2018-12-29 MED ORDER — ACETAMINOPHEN 650 MG RE SUPP: 650.0000 mg | Freq: Four times a day (QID) | RECTAL | Status: DC | PRN

## 2018-12-29 MED ORDER — CLOPIDOGREL BISULFATE 75 MG PO TABS
75.0000 mg | ORAL_TABLET | Freq: Every day | ORAL | Status: DC
  Administered 2018-12-29 – 2019-01-03 (×6): 75 mg via ORAL
  Filled 2018-12-29 (×6): qty 1

## 2018-12-29 MED ORDER — AMLODIPINE BESYLATE 5 MG PO TABS
5.0000 mg | ORAL_TABLET | Freq: Every day | ORAL | Status: DC
  Administered 2018-12-29 – 2019-01-03 (×6): 5 mg via ORAL
  Filled 2018-12-29 (×6): qty 1

## 2018-12-29 MED ORDER — VITAMIN B-12 1000 MCG PO TABS
1000.0000 ug | ORAL_TABLET | Freq: Every day | ORAL | Status: DC
  Administered 2018-12-29 – 2019-01-03 (×6): 1000 ug via ORAL
  Filled 2018-12-29 (×6): qty 1

## 2018-12-29 MED ORDER — IOHEXOL 350 MG/ML SOLN
100.0000 mL | Freq: Once | INTRAVENOUS | Status: AC | PRN
  Administered 2018-12-29: 100 mL via INTRAVENOUS

## 2018-12-29 MED ORDER — OCUVITE-LUTEIN PO CAPS
1.0000 | ORAL_CAPSULE | Freq: Every day | ORAL | Status: DC
  Administered 2018-12-29: 15:00:00 1 via ORAL
  Filled 2018-12-29 (×2): qty 1

## 2018-12-29 NOTE — Consult Note (Signed)
Danielle A. Merlene Laughter, MD     [email or web address]          Danielle [name] is an 85 y.o. female.   ASSESSMENT/PLAN: 1.  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  Given the confusing picture, additional imaging is recommended.  CTA of the head and neck is therefore suggested.  In the meantime, dual antiplatelet agents are recommended.  She should likely be on dual antiplatelet agents for 3 months.  Subsequently, single agent is recommended.  Blood sugar is quite elevated and likely contributed to the deterioration in her stroke.  Continue with the statin and better blood sugar control.  Patient will need physical and occupational therapies.    The patient 85 year old right-handed white female who was admitted to this hospital about a week ago for acute left-sided weakness.  She was worked up and sent home but presented a day later with the worsening left-sided weakness and worsening gait.  Patient was admitted to this hospital in 2019 with again left-sided symptoms and imaging at that time showed an occluded extracranial right ICA.  Repeat imaging again shows a occluded right ICA where infarct a few days ago.  Imaging below are reviewed and shows that her stroke apparently has extended.  The patient does not report having dizziness, chest pain, shortness of breath or right-sided symptoms.  She denies any headaches, dysarthria, dysphagia or dysarthria.  She does not report having any dizziness.  The review systems otherwise negative.    GENERAL: This a pleasant female who is in no acute distress.  HEENT: Neck is supple no trauma appreciated.  ABDOMEN: soft  EXTREMITIES: No edema   BACK: Normal  SKIN: Normal by inspection.    MENTAL STATUS: Alert and oriented  -including orientation to medical history, the month and age.  Speech, language and cognition are generally intact. Judgment and insight normal.   CRANIAL NERVES: Pupils are equal, round and reactive to light and accomodation; extra ocular movements are full, there is no significant nystagmus; visual fields are full; upper and lower facial muscles are normal in strength and symmetric, there is no flattening of the nasolabial folds; tongue is midline; uvula is midline; shoulder elevation is normal.  She appears to have some extinction on visual simultaneous stimulation on the left side.  MOTOR: Left upper extremity is close to being plegic and graded as 1/5.  Left lower extremity shows a hip flexion of 4- and dorsiflexion 3/5.  There is a mild drift of the left lower extremity.  The right side shows normal tone, bulk and strength without any drift.  COORDINATION: Left finger to nose is normal, right finger to nose is normal, No rest tremor; no intention tremor; no postural tremor; no bradykinesia.  REFLEXES: Deep tendon reflexes are symmetrical and normal.    SENSATION: Normal to light touch, temperature, and pain.  She does not extinguish to tactile stimulation.   NIH stroke scale 3, 1, 1 total 5.   Blood pressure (!) 161/60, pulse 78, temperature 98 F (36.7 C), temperature source Oral, resp. rate 20, height 5\' 5"  (1.651 m), weight 60 kg, SpO2 98 %.  Past Medical History:  Diagnosis Date  . Arthritis    "some joint pain once in awhile" (05/26/2017)  . History of kidney stones   . Hyperlipemia   .  Hypertension   . Hypothyroidism (acquired)   . Pneumothorax 02/05/2017   right-after fall  . Rib fractures 02/05/2017   right side  . Stroke ([place])   . Type II diabetes mellitus ([place])     Past Surgical History:  Procedure Laterality Date  . AUGMENTATION MAMMAPLASTY Bilateral   . DILATION AND CURETTAGE OF UTERUS    . FRACTURE SURGERY    . REVERSE SHOULDER ARTHROPLASTY Right 05/26/2017   . REVERSE SHOULDER ARTHROPLASTY Right 05/26/2017   Procedure: REVERSE RIGHT SHOULDER ARTHROPLASTY;  Surgeon: Meredith Pel, MD;  Location: [facility];  Service: Orthopedics;  Laterality: Right;  . TONSILLECTOMY    . TUBAL LIGATION      No family history on file.  Social History:  reports that she quit smoking about 29 years ago. Her smoking use included cigarettes. She has a 40.00 pack-year smoking history. She has never used smokeless tobacco. She reports that she does not drink alcohol or use drugs.  Allergies:  Allergies  Allergen Reactions  . Fosamax [Alendronate Sodium] Other (See Comments)    MUSCLE ACHES  . Prednisone     Feels jittery and nauseous    Medications: Prior to Admission medications   Medication Sig Start Date End Date Taking? Authorizing Provider  amLODipine (NORVASC) 5 MG tablet Take 1 tablet (5 mg total) by mouth daily. 12/27/18 12/27/19  Sinda Du, MD  aspirin 325 MG tablet Take 1 tablet (325 mg total) by mouth daily. 12/28/18   Sinda Du, MD  atorvastatin (LIPITOR) 40 MG tablet Take 1 tablet (40 mg total) by mouth daily at 6 PM. 05/04/18   Patrecia Pour, MD  Calcium Carb-Cholecalciferol (CALCIUM 600 + D PO) Take 1 tablet by mouth daily.    [provider]  guaiFENesin (MUCINEX) 600 MG 12 hr tablet Take 2 tablets (1,200 mg total) by mouth 2 (two) times daily. 12/27/18   Sinda Du, MD  insulin lispro (HUMALOG) 100 UNIT/ML injection Inject 12 Units into the skin once.     [provider]  Multiple Vitamin (MULTIVITAMIN WITH MINERALS) TABS Take 1 tablet by mouth daily.     [provider]  multivitamin-lutein (OCUVITE-LUTEIN) CAPS capsule Take 1 capsule by mouth daily.    [provider]  Omega-3 Fatty Acids (FISH OIL) 1000 MG CAPS Take 1,000 mg by mouth daily.    [provider]  SYNTHROID 100 MCG tablet Take 100 mcg by mouth every morning.  08/21/18   [provider]  TOUJEO SOLOSTAR 300 UNIT/ML  SOPN Inject 20-22 Units into the skin every morning.  09/07/18   [provider]  vitamin B-12 (CYANOCOBALAMIN) 1000 MCG tablet Take 1,000 mcg by mouth daily.    [provider]    Scheduled Meds: . amLODipine  5 mg Oral Daily  . aspirin  325 mg Oral Daily  . clopidogrel  75 mg Oral Daily  . enoxaparin (LOVENOX) injection  40 mg Subcutaneous Q24H  . insulin aspart  0-15 Units Subcutaneous TID WC  . insulin aspart  0-5 Units Subcutaneous QHS  . insulin detemir  20 Units Subcutaneous QHS  . [START ON 12/30/2018] levothyroxine  100 mcg Oral q morning - 10a  . multivitamin-lutein  1 capsule Oral Daily  . sodium chloride flush  3 mL Intravenous Q12H  . vitamin B-12  1,000 mcg Oral Daily   Continuous Infusions: PRN Meds:.acetaminophen **OR** acetaminophen     Results for orders placed or performed during the hospital encounter of 12/29/18 (from  the past 48 hour(s))  Comprehensive metabolic panel     Status: Abnormal   Collection Time: 12/29/18  3:41 PM  Result Value Ref Range   Sodium 134 (L) 135 - 145 mmol/L   Potassium 4.5 3.5 - 5.1 mmol/L   Chloride 100 98 - 111 mmol/L   CO2 23 22 - 32 mmol/L   Glucose, Bld 595 (HH) 70 - 99 mg/dL    Comment: CRITICAL RESULT CALLED TO, READ BACK BY AND VERIFIED WITH: DILDY,V ON 12/29/18 AT 1705 BY LOY,C    BUN 23 8 - 23 mg/dL   Creatinine, Ser 0.98 0.44 - 1.00 mg/dL   Calcium 9.1 8.9 - 10.3 mg/dL   Total Protein 6.4 (L) 6.5 - 8.1 g/dL   Albumin 3.4 (L) 3.5 - 5.0 g/dL   AST 51 (H) 15 - 41 U/L   ALT 46 (H) 0 - 44 U/L   Alkaline Phosphatase 117 38 - 126 U/L   Total Bilirubin 0.8 0.3 - 1.2 mg/dL   GFR calc non Af Amer 53 (L) >60 mL/min   GFR calc Af Amer >60 >60 mL/min   Anion gap 11 5 - 15    Comment: Performed at [facility], [address] [place], [place] [address]  CBC WITH DIFFERENTIAL     Status: Abnormal   Collection Time: 12/29/18  3:41 PM  Result Value Ref Range   WBC 6.0 4.0 - 10.5 K/uL   RBC 3.99 3.87 - 5.11  MIL/uL   Hemoglobin 11.9 (L) 12.0 - 15.0 g/dL   HCT 37.4 36.0 - 46.0 %   MCV 93.7 80.0 - 100.0 fL   MCH 29.8 26.0 - 34.0 pg   MCHC 31.8 30.0 - 36.0 g/dL   RDW 13.9 11.5 - 15.5 %   Platelets 298 150 - 400 K/uL   nRBC 0.0 0.0 - 0.2 %   Neutrophils Relative % 64 %   Neutro Abs 3.8 1.7 - 7.7 K/uL   Lymphocytes Relative 24 %   Lymphs Abs 1.4 0.7 - 4.0 K/uL   Monocytes Relative 10 %   Monocytes Absolute 0.6 0.1 - 1.0 K/uL   Eosinophils Relative 1 %   Eosinophils Absolute 0.1 0.0 - 0.5 K/uL   Basophils Relative 1 %   Basophils Absolute 0.1 0.0 - 0.1 K/uL   Immature Granulocytes 0 %   Abs Immature Granulocytes 0.01 0.00 - 0.07 K/uL    Comment: Performed at [facility], [address] [place], [place] [address]  TSH     Status: Abnormal   Collection Time: 12/29/18  3:41 PM  Result Value Ref Range   TSH 0.122 (L) 0.350 - 4.500 uIU/mL    Comment: Performed by a 3rd Generation assay with a functional sensitivity of <=0.01 uIU/mL. Performed at [facility], [address], [place], [place] [address]     Studies/Results:   The brain MRI and MRA reviewed in person.   The scan done today as compared to the scan done 6 with this month.  Same area is showed increased signal on DWI involving the right frontal areas are also seen on the current scan but essentially of got bigger.  These areas are essentially in  In the watershed distribution on the same side involving the parasagittal area of the right frontal lobe.  There is an area of encephalomalacia involving the insular cortex and basal ganglia also on the right side indicate a remote infarct.  This is associated with increased signal also on DWI but  I think this most likely is a chronic finding as it does not clearly shows reduced signal on the ADC scan.   No area of micro hemorrhages appreciated.  There is large area involving the left frontal region consistent with meningioma in the extra-axial formation isodense on T1 and bright on the other  imaging.   Vardaan Depascale A. Merlene [name], M.D.  Diplomate, Tax adviser of Psychiatry and Neurology ( Neurology). 12/29/2018, 6:34 PM

## 2018-12-29 NOTE — Progress Notes (Signed)
Patient a direct admit to room 304. Patient alert and oriented x's 4,family at the bedside. Vital signs stable. Placed on telemetry box 4.Will continue to monitor patient.

## 2018-12-29 NOTE — H&P (Signed)
Brief note full note to follow. Admitted from my office. She had a stroke and was doing well with ambulation but had arm weakness. When she got home had leg weakness and trouble walking . Brought back in for work up/ treatment.

## 2018-12-29 NOTE — Progress Notes (Signed)
Inpatient Rehabilitation Admissions Coordinator  Inpatient rehab consult received. I will follow up with pt and family by phone in the morning for rehab assessment as well as Epic chart review.  Danne Baxter, RN, MSN Rehab Admissions Coordinator [phone] 12/29/2018 4:13 PM

## 2018-12-29 NOTE — Evaluation (Addendum)
Physical Therapy Evaluation Patient Details Name: Danielle [name] MRN: [id number] DOB: 1932-04-07 Today's Date: 12/29/2018   History of Present Illness  Patient is a 85 y/o female with history of CVA presented with left UE weakness to the ED. MRI was completed on 12/23/18 showing multiple small acute right MCA infarcts. She was discharged from hospital to home on 12/27/18. had difficulties getting out of car after discharge and slid to ground, son and father had to help get her up. Per husband, they went to the ER, she was seen and sent home. They were home yesterday and husband had increased difficulty transfering patient. MD called to follow up wtih patient and wanted her admitted to hospital.    Clinical Impression  Patient presents with increased weakness and a decrease in overall  functional mobility since last evaluated in hospital by physical therapy on 12/24/18. Overall patient is eager to get stronger and is able to participate in LE exercises on this date. Patient tolerated treatment well and able to assist with all transfers and ambulation. Patient very unsure about balance and fearful of falling. Patient would greatly benefit from skilled physical therapy to improve overall strength and functional mobility. At this time husband is not sure he can take care of patient with the current assist she is requiring.     Follow Up Recommendations CIR    Equipment Recommendations  Other (comment)(hemi walker)    Recommendations for Other Services OT consult     Precautions / Restrictions Precautions Precautions: Fall Precaution Comments: LUE neglect, left hemiparesis. Restrictions Weight Bearing Restrictions: No      Mobility  Bed Mobility Overal bed mobility: Needs Assistance Bed Mobility: Sit to Supine;Supine to Sit     Supine to sit: Mod assist Sit to supine: Min assist;Mod assist   General bed mobility comments: mod assist to get patient sitting edge of bed (very difficult  for patient to scoot to edge of bed and to scoot up in bed)  Transfers Overall transfer level: Needs assistance Equipment used: Rolling walker (2 wheeled);Pushed w/c(would benefit from hemi walker) Transfers: Sit to/from Stand Sit to Stand: Min assist         General transfer comment: slow movement, needs assist with moving walker forward (left hand cannot grab walker) - would benefit from hemi walker  Ambulation/Gait Ambulation/Gait assistance: Mod assist Gait Distance (Feet): 15 Feet Assistive device: Rolling walker (2 wheeled) Gait Pattern/deviations: Step-to pattern;Decreased weight shift to left;Trunk flexed Gait velocity: decreased   General Gait Details: slow movement, needs assist with moving walker forward (left hand cannot grab walker) - would benefit from hemi walker - needs min/mod assist for balance/support and patient to trust herself for forward progression (afraid of falling, very unbalanced)  Stairs            Wheelchair Mobility    Modified Rankin (Stroke Patients Only) Modified Rankin (Stroke Patients Only) Pre-Morbid Rankin Score: Moderate disability Modified Rankin: Moderately severe disability     Balance Overall balance assessment: Needs assistance Sitting-balance support: Feet supported;Single extremity supported(R UE support) Sitting balance-Leahy Scale: Poor Sitting balance - Comments: seated EOB Postural control: Left lateral lean(forgets to use R UE for support) Standing balance support: Single extremity supported;During functional activity Standing balance-Leahy Scale: Fair Standing balance comment: railing or RW with R UE                             Pertinent Vitals/Pain Pain Assessment: No/denies pain(at  rest) Pain Location: left UE very sore to the touch    Home Living Family/patient expects to be discharged to:: Private residence Living Arrangements: Spouse/significant other Available Help at Discharge:  Family;Available 24 hours/day Type of Home: House Home Access: Stairs to enter Entrance Stairs-Rails: Right Entrance Stairs-Number of Steps: 2 Home Layout: Two level;Able to live on main level with bedroom/bathroom Home Equipment: Gilford Rile - 2 wheels;Cane - single point;Bedside commode;Grab bars - tub/shower;Wheelchair - manual;Shower seat      Prior Function Level of Independence: Independent with assistive device(s)         Comments: household and short distanced community ambulator with cane as needed     Hand Dominance   Dominant Hand: Right    Extremity/Trunk Assessment   Upper Extremity Assessment Upper Extremity Assessment: Defer to OT evaluation    Lower Extremity Assessment RLE Deficits / Details: grossly 5/5 LLE Deficits / Details: grossly 3/5    Cervical / Trunk Assessment Cervical / Trunk Assessment: Normal  Communication   Communication: No difficulties  Cognition Arousal/Alertness: Awake/alert Behavior During Therapy: WFL for tasks assessed/performed Overall Cognitive Status: Within Functional Limits for tasks assessed                                        General Comments      Exercises General Exercises - Lower Extremity Ankle Circles/Pumps: AROM;Strengthening;Both;10 reps;Supine Heel Slides: AROM;Both;10 reps;Supine;Strengthening Straight Leg Raises: AROM;5 reps;Both;Supine;Strengthening   Assessment/Plan    PT Assessment Patient needs continued PT services  PT Problem List Decreased strength;Decreased activity tolerance;Decreased balance;Decreased mobility       PT Treatment Interventions Gait training;Stair training;Functional mobility training;Therapeutic activities;Therapeutic exercise;Patient/family education;Balance training    PT Goals (Current goals can be found in the Care Plan section)  Acute Rehab PT Goals Patient Stated Goal: to get stronger so she can return home PT Goal Formulation: With patient/family Time  For Goal Achievement: 01/12/19 Potential to Achieve Goals: Good    Frequency 7X/week   Barriers to discharge        Co-evaluation               AM-PAC PT "6 Clicks" Mobility  Outcome Measure Help needed turning from your back to your side while in a flat bed without using bedrails?: A Little Help needed moving from lying on your back to sitting on the side of a flat bed without using bedrails?: A Little Help needed moving to and from a bed to a chair (including a wheelchair)?: A Lot Help needed standing up from a chair using your arms (e.g., wheelchair or bedside chair)?: A Little Help needed to walk in hospital room?: A Lot Help needed climbing 3-5 steps with a railing? : Total 6 Click Score: 14    End of Session Equipment Utilized During Treatment: Gait belt Activity Tolerance: Patient tolerated treatment well;Patient limited by fatigue Patient left: in bed;with family/visitor present;with call bell/phone within reach Nurse Communication: Mobility status PT Visit Diagnosis: Unsteadiness on feet (R26.81);Other abnormalities of gait and mobility (R26.89);Muscle weakness (generalized) (M62.81)    Time: 1410-1443 PT Time Calculation (min) (ACUTE ONLY): 33 min   Charges:   PT Evaluation $PT Eval Moderate Complexity: 1 Mod PT Treatments $Therapeutic Exercise: 8-22 mins $Therapeutic Activity: 8-22 mins       Jerene Pitch, DPT Physical Therapy with [facility]  [phone] office 12/29/2018, 3:22 PM

## 2018-12-30 ENCOUNTER — Inpatient Hospital Stay (HOSPITAL_COMMUNITY): Payer: Medicare Other

## 2018-12-30 ENCOUNTER — Encounter (HOSPITAL_COMMUNITY): Payer: Self-pay | Admitting: *Deleted

## 2018-12-30 DIAGNOSIS — I10 Essential (primary) hypertension: Secondary | ICD-10-CM

## 2018-12-30 DIAGNOSIS — I6521 Occlusion and stenosis of right carotid artery: Secondary | ICD-10-CM

## 2018-12-30 DIAGNOSIS — I6523 Occlusion and stenosis of bilateral carotid arteries: Secondary | ICD-10-CM

## 2018-12-30 DIAGNOSIS — I639 Cerebral infarction, unspecified: Secondary | ICD-10-CM

## 2018-12-30 DIAGNOSIS — E1165 Type 2 diabetes mellitus with hyperglycemia: Secondary | ICD-10-CM

## 2018-12-30 DIAGNOSIS — I63231 Cerebral infarction due to unspecified occlusion or stenosis of right carotid arteries: Secondary | ICD-10-CM

## 2018-12-30 DIAGNOSIS — E119 Type 2 diabetes mellitus without complications: Secondary | ICD-10-CM

## 2018-12-30 LAB — GLUCOSE, CAPILLARY
Glucose-Capillary: 169 mg/dL — ABNORMAL HIGH (ref 70–99)
Glucose-Capillary: 170 mg/dL — ABNORMAL HIGH (ref 70–99)
Glucose-Capillary: 199 mg/dL — ABNORMAL HIGH (ref 70–99)
Glucose-Capillary: 221 mg/dL — ABNORMAL HIGH (ref 70–99)
Glucose-Capillary: 247 mg/dL — ABNORMAL HIGH (ref 70–99)
Glucose-Capillary: 415 mg/dL — ABNORMAL HIGH (ref 70–99)
Glucose-Capillary: 91 mg/dL (ref 70–99)

## 2018-12-30 MED ORDER — INSULIN ASPART 100 UNIT/ML ~~LOC~~ SOLN
0.0000 [IU] | SUBCUTANEOUS | Status: DC
  Administered 2018-12-30 (×2): 2 [IU] via SUBCUTANEOUS
  Administered 2018-12-30: 9 [IU] via SUBCUTANEOUS
  Administered 2018-12-30: 16:00:00 3 [IU] via SUBCUTANEOUS
  Administered 2018-12-30: 13:00:00 2 [IU] via SUBCUTANEOUS
  Administered 2018-12-30: 3 [IU] via SUBCUTANEOUS
  Administered 2018-12-31: 9 [IU] via SUBCUTANEOUS
  Administered 2018-12-31 (×2): 5 [IU] via SUBCUTANEOUS
  Administered 2018-12-31: 3 [IU] via SUBCUTANEOUS
  Administered 2019-01-01: 1 [IU] via SUBCUTANEOUS
  Administered 2019-01-01: 9 [IU] via SUBCUTANEOUS
  Administered 2019-01-01: 2 [IU] via SUBCUTANEOUS
  Administered 2019-01-01: 7 [IU] via SUBCUTANEOUS
  Administered 2019-01-01: 9 [IU] via SUBCUTANEOUS
  Administered 2019-01-01: 5 [IU] via SUBCUTANEOUS
  Administered 2019-01-02: 7 [IU] via SUBCUTANEOUS
  Administered 2019-01-02: 3 [IU] via SUBCUTANEOUS
  Administered 2019-01-02: 7 [IU] via SUBCUTANEOUS
  Administered 2019-01-02: 9 [IU] via SUBCUTANEOUS
  Administered 2019-01-02: 3 [IU] via SUBCUTANEOUS
  Administered 2019-01-02: 7 [IU] via SUBCUTANEOUS
  Administered 2019-01-03: 06:00:00 3 [IU] via SUBCUTANEOUS
  Administered 2019-01-03: 9 [IU] via SUBCUTANEOUS
  Administered 2019-01-03 (×2): 5 [IU] via SUBCUTANEOUS

## 2018-12-30 MED ORDER — SODIUM CHLORIDE 0.9% FLUSH
3.0000 mL | Freq: Two times a day (BID) | INTRAVENOUS | Status: DC
  Administered 2018-12-30 – 2019-01-03 (×9): 3 mL via INTRAVENOUS

## 2018-12-30 MED ORDER — SODIUM CHLORIDE 0.9% FLUSH: 3.0000 mL | INTRAVENOUS | Status: DC | PRN

## 2018-12-30 MED ORDER — ATORVASTATIN CALCIUM 80 MG PO TABS
80.0000 mg | ORAL_TABLET | Freq: Every day | ORAL | Status: DC
  Administered 2018-12-30 – 2019-01-03 (×5): 80 mg via ORAL
  Filled 2018-12-30 (×5): qty 1

## 2018-12-30 MED ORDER — PROSIGHT PO TABS
1.0000 | ORAL_TABLET | Freq: Every day | ORAL | Status: DC
  Administered 2018-12-30 – 2019-01-03 (×5): 1 via ORAL
  Filled 2018-12-30 (×5): qty 1

## 2018-12-30 NOTE — Consult Note (Signed)
Referring Physician: Dr. Luan Pulling    Chief Complaint: Left sided weakness  HPI: Danielle [name] is an 85 y.o. female with a PMHx of hypothyroidism, hypertension, hyperlipidemia, DM2, stroke in 05/03/2018 and more recent admission 12/23/2018 for R MCA stroke with R carotid occlusion. She presented to [facility] with worsening left sided weakness and ataxia. She was found to have multiple acute R MCA infarcts involving the frontal and parietal lobes. She was seen by Dr. Merlene Laughter who ordered a CTA of head and neck.   CTA of head and neck showed bilateral string-sign stenoses of BOTH ICA origins due to bulky calcification. Distal ICA and related intracranial findings are as follows:  Right ICA: Thread-like distal to the severe stenosis, and occludes in the mid right siphon. There is suboptimal reconstitution of the distal right ICA and the right MCA and right ACA are patent but diminutive.  Left ICA: Maintains a more normal caliber distal to the severe stenosis. And the left anterior circulation is largely normal. Posterior circulation. There is no vertebral or basilar artery stenosis, but the Right Pcomm is diminutive or absent. There is up to moderate right PCA stenosis. Brain parenchyma: Expected CT appearance of right MCA ischemia. No intracranial hemorrhage or mass effect. Also noted: Chronic left para falcine meningioma.  Per radiology it was felt that the stenoses would be difficult for IR intervention, and more appropriate for vascular surgery to intervene. She was on ASA at home, which has been escalated to DAPT with ASA and Plavix. She is also on Lipitor 40 mg qd at home.  The patient was transferred to [facility] for further management.   Past Medical History:  Diagnosis Date  . Arthritis    "some joint pain once in awhile" (05/26/2017)  . History of kidney stones   . Hyperlipemia   . Hypertension   . Hypothyroidism (acquired)   . Pneumothorax 02/05/2017   right-after fall  . Rib  fractures 02/05/2017   right side  . Stroke ([place])   . Type II diabetes mellitus ([place])     Past Surgical History:  Procedure Laterality Date  . AUGMENTATION MAMMAPLASTY Bilateral   . DILATION AND CURETTAGE OF UTERUS    . FRACTURE SURGERY    . REVERSE SHOULDER ARTHROPLASTY Right 05/26/2017  . REVERSE SHOULDER ARTHROPLASTY Right 05/26/2017   Procedure: REVERSE RIGHT SHOULDER ARTHROPLASTY;  Surgeon: Meredith Pel, MD;  Location: [facility];  Service: Orthopedics;  Laterality: Right;  . TONSILLECTOMY    . TUBAL LIGATION      History reviewed. No pertinent family history. Social History:  reports that she quit smoking about 29 years ago. Her smoking use included cigarettes. She has a 40.00 pack-year smoking history. She has never used smokeless tobacco. She reports that she does not drink alcohol or use drugs.  Allergies:  Allergies  Allergen Reactions  . Fosamax [Alendronate Sodium] Other (See Comments)    MUSCLE ACHES  . Prednisone     Feels jittery and nauseous    Medications:  No current facility-administered medications on file prior to encounter.    Current Outpatient Medications on File Prior to Encounter  Medication Sig Dispense Refill  . amLODipine (NORVASC) 5 MG tablet Take 1 tablet (5 mg total) by mouth daily. 30 tablet 11  . aspirin 325 MG tablet Take 1 tablet (325 mg total) by mouth daily. 30 tablet 12  . atorvastatin (LIPITOR) 40 MG tablet Take 1 tablet (40 mg total) by mouth daily at 6 PM. 30 tablet  0  . Calcium Carb-Cholecalciferol (CALCIUM 600 + D PO) Take 1 tablet by mouth daily.    Marland Kitchen guaiFENesin (MUCINEX) 600 MG 12 hr tablet Take 2 tablets (1,200 mg total) by mouth 2 (two) times daily. 30 tablet 1  . insulin lispro (HUMALOG) 100 UNIT/ML injection Inject 12 Units into the skin once.     . Multiple Vitamin (MULTIVITAMIN WITH MINERALS) TABS Take 1 tablet by mouth daily.     . multivitamin-lutein (OCUVITE-LUTEIN) CAPS capsule Take 1 capsule by mouth daily.    .  Omega-3 Fatty Acids (FISH OIL) 1000 MG CAPS Take 1,000 mg by mouth daily.    Marland Kitchen SYNTHROID 100 MCG tablet Take 100 mcg by mouth every morning.     Marland Kitchen TOUJEO SOLOSTAR 300 UNIT/ML SOPN Inject 20-22 Units into the skin every morning.     . vitamin B-12 (CYANOCOBALAMIN) 1000 MCG tablet Take 1,000 mcg by mouth daily.      Current Facility-Administered Medications:  .  acetaminophen (TYLENOL) tablet 650 mg, 650 mg, Oral, Q6H PRN **OR** acetaminophen (TYLENOL) suppository 650 mg, 650 mg, Rectal, Q6H PRN, Jani Gravel, MD .  amLODipine (NORVASC) tablet 5 mg, 5 mg, Oral, Daily, Jani Gravel, MD, 5 mg at 12/29/18 1456 .  aspirin tablet 325 mg, 325 mg, Oral, Daily, Jani Gravel, MD, 325 mg at 12/29/18 1457 .  clopidogrel (PLAVIX) tablet 75 mg, 75 mg, Oral, Daily, Jani Gravel, MD, 75 mg at 12/29/18 1846 .  enoxaparin (LOVENOX) injection 40 mg, 40 mg, Subcutaneous, Q24H, Jani Gravel, MD, 40 mg at 12/29/18 1457 .  insulin aspart (novoLOG) injection 0-9 Units, 0-9 Units, Subcutaneous, Q4H, Jani Gravel, MD, 2 Units at 12/30/18 0500 .  levothyroxine (SYNTHROID) tablet 100 mcg, 100 mcg, Oral, q morning - 10a, Jani Gravel, MD, 100 mcg at 12/30/18 0524 .  multivitamin (PROSIGHT) tablet 1 tablet, 1 tablet, Oral, Daily, Sinda Du, MD .  sodium chloride flush (NS) 0.9 % injection 3 mL, 3 mL, Intravenous, Q12H, Jani Gravel, MD, 3 mL at 12/29/18 2229 .  vitamin B-12 (CYANOCOBALAMIN) tablet 1,000 mcg, 1,000 mcg, Oral, Daily, Jani Gravel, MD, 1,000 mcg at 12/29/18 1456   ROS: Deferred comprehensive ROS as patient is reluctant to participate in interview.   Physical Examination: Blood pressure (!) 160/65, pulse 77, temperature 97.9 F (36.6 C), temperature source Oral, resp. rate 16, height 5\' 5"  (1.651 m), weight 60.1 kg, SpO2 97 %.  HEENT: [name]/AT Lungs: Respirations unlabored Ext: No edema  Neurologic Examination: Mental Status: Drowsy due to sleep deprivation this hospital stay. Oriented x 5. Speech fluent with intact  comprehension and naming. Some degree of abulia with alogia (terse, matter-of-fact responses to questions). Decreased prosodic quality to her speech in addition to flattened affect.  Cranial Nerves: II:  Visual fields intact to confrontation testing. PERRL. III,IV, VI: EOMI with no ptosis or nystagmus.  VII: Face is symmetric.  VIII: hearing intact to questions and commands IX,X: No hoarseness or hypophonia XI: Head is midline XII: No lingual dysarthria Motor: RUE 5/5 LUE 0/5 RLE 5/5 LLE 4/5 Sensory: Temp sensation subjectively equal BUE and BLE. There is allodynia to FT LUE Deep Tendon Reflexes:  3+ left brachioradialis and biceps 2+ right brachioradialis and biceps 2+ right patellar Trace left patellar Cerebellar: No ataxia with FNF on right. Unable to perform on the left Gait: Deferred due to falls risk concerns  Results for orders placed or performed during the hospital encounter of 12/29/18 (from the past 48 hour(s))  Comprehensive metabolic panel  Status: Abnormal   Collection Time: 12/29/18  3:41 PM  Result Value Ref Range   Sodium 134 (L) 135 - 145 mmol/L   Potassium 4.5 3.5 - 5.1 mmol/L   Chloride 100 98 - 111 mmol/L   CO2 23 22 - 32 mmol/L   Glucose, Bld 595 (HH) 70 - 99 mg/dL    Comment: CRITICAL RESULT CALLED TO, READ BACK BY AND VERIFIED WITH: DILDY,V ON 12/29/18 AT 1705 BY LOY,C    BUN 23 8 - 23 mg/dL   Creatinine, Ser 0.98 0.44 - 1.00 mg/dL   Calcium 9.1 8.9 - 10.3 mg/dL   Total Protein 6.4 (L) 6.5 - 8.1 g/dL   Albumin 3.4 (L) 3.5 - 5.0 g/dL   AST 51 (H) 15 - 41 U/L   ALT 46 (H) 0 - 44 U/L   Alkaline Phosphatase 117 38 - 126 U/L   Total Bilirubin 0.8 0.3 - 1.2 mg/dL   GFR calc non Af Amer 53 (L) >60 mL/min   GFR calc Af Amer >60 >60 mL/min   Anion gap 11 5 - 15    Comment: Performed at [facility], [address], [place], [place] [address]  CBC WITH DIFFERENTIAL     Status: Abnormal   Collection Time: 12/29/18  3:41 PM  Result Value Ref Range   WBC  6.0 4.0 - 10.5 K/uL   RBC 3.99 3.87 - 5.11 MIL/uL   Hemoglobin 11.9 (L) 12.0 - 15.0 g/dL   HCT 37.4 36.0 - 46.0 %   MCV 93.7 80.0 - 100.0 fL   MCH 29.8 26.0 - 34.0 pg   MCHC 31.8 30.0 - 36.0 g/dL   RDW 13.9 11.5 - 15.5 %   Platelets 298 150 - 400 K/uL   nRBC 0.0 0.0 - 0.2 %   Neutrophils Relative % 64 %   Neutro Abs 3.8 1.7 - 7.7 K/uL   Lymphocytes Relative 24 %   Lymphs Abs 1.4 0.7 - 4.0 K/uL   Monocytes Relative 10 %   Monocytes Absolute 0.6 0.1 - 1.0 K/uL   Eosinophils Relative 1 %   Eosinophils Absolute 0.1 0.0 - 0.5 K/uL   Basophils Relative 1 %   Basophils Absolute 0.1 0.0 - 0.1 K/uL   Immature Granulocytes 0 %   Abs Immature Granulocytes 0.01 0.00 - 0.07 K/uL    Comment: Performed at [facility], [address] [place], [place] [address]  TSH     Status: Abnormal   Collection Time: 12/29/18  3:41 PM  Result Value Ref Range   TSH 0.122 (L) 0.350 - 4.500 uIU/mL    Comment: Performed by a 3rd Generation assay with a functional sensitivity of <=0.01 uIU/mL. Performed at [facility], [address] [place], [place] [address]   SARS Coronavirus 2 [facility] order, Performed in [facility] hospital lab)     Status: None   Collection Time: 12/29/18  7:10 PM  Result Value Ref Range   SARS Coronavirus 2 NEGATIVE NEGATIVE    Comment: (NOTE) If result is NEGATIVE SARS-CoV-2 target nucleic acids are NOT DETECTED. The SARS-CoV-2 RNA is generally detectable in upper and lower  respiratory specimens during the acute phase of infection. The lowest  concentration of SARS-CoV-2 viral copies this assay can detect is 250  copies / mL. A negative result does not preclude SARS-CoV-2 infection  and should not be used as the sole basis for treatment or other  patient management decisions.  A negative result may occur with  improper  specimen collection / handling, submission of specimen other  than nasopharyngeal swab, presence of viral mutation(s) within the  areas targeted by this assay,  and inadequate number of viral copies  (<250 copies / mL). A negative result must be combined with clinical  observations, patient history, and epidemiological information. If result is POSITIVE SARS-CoV-2 target nucleic acids are DETECTED. The SARS-CoV-2 RNA is generally detectable in upper and lower  respiratory specimens dur ing the acute phase of infection.  Positive  results are indicative of active infection with SARS-CoV-2.  Clinical  correlation with patient history and other diagnostic information is  necessary to determine patient infection status.  Positive results do  not rule out bacterial infection or co-infection with other viruses. If result is PRESUMPTIVE POSTIVE SARS-CoV-2 nucleic acids MAY BE PRESENT.   A presumptive positive result was obtained on the submitted specimen  and confirmed on repeat testing.  While 2019 novel coronavirus  (SARS-CoV-2) nucleic acids may be present in the submitted sample  additional confirmatory testing may be necessary for epidemiological  and / or clinical management purposes  to differentiate between  SARS-CoV-2 and other Sarbecovirus currently known to infect humans.  If clinically indicated additional testing with an alternate test  methodology [phone]) is advised. The SARS-CoV-2 RNA is generally  detectable in upper and lower respiratory sp ecimens during the acute  phase of infection. The expected result is Negative. Fact Sheet for Patients:  StrictlyIdeas.no Fact Sheet for Healthcare Providers: BankingDealers.co.za This test is not yet approved or cleared by the [place] FDA and has been authorized for detection and/or diagnosis of SARS-CoV-2 by FDA under an Emergency Use Authorization (EUA).  This EUA will remain in effect (meaning this test can be used) for the duration of the COVID-19 declaration under Section 564(b)(1) of the Act, 21 U.S.C. section 360bbb-3(b)(1), unless  the authorization is terminated or revoked sooner. Performed at [facility], [address] [place],  [address]   Glucose, capillary     Status: Abnormal   Collection Time: 12/29/18 10:21 PM  Result Value Ref Range   Glucose-Capillary 199 (H) 70 - 99 mg/dL  Glucose, capillary     Status: Abnormal   Collection Time: 12/30/18  1:19 AM  Result Value Ref Range   Glucose-Capillary 199 (H) 70 - 99 mg/dL  Glucose, capillary     Status: Abnormal   Collection Time: 12/30/18  5:00 AM  Result Value Ref Range   Glucose-Capillary 170 (H) 70 - 99 mg/dL   Ct Angio Head W Or Wo Contrast  Addendum Date: 12/29/2018   ADDENDUM REPORT: 12/29/2018 23:52 ADDENDUM: Study discussed by telephone with Dr. Jani Gravel on 12/29/2018 at 2337 hours. Electronically Signed   By: Genevie Ann M.D.   On: 12/29/2018 23:52   Result Date: 12/29/2018 CLINICAL DATA:  85 year old female with acute, subacute, and chronic right MCA territory infarcts. 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 EXAM: CT ANGIOGRAPHY HEAD AND NECK TECHNIQUE: Multidetector CT imaging of the head and neck was performed using the standard protocol during bolus administration of intravenous contrast. Multiplanar CT image reconstructions and MIPs were obtained to evaluate the vascular anatomy. Carotid stenosis measurements (when applicable) are obtained utilizing NASCET criteria, using the distal internal carotid diameter as the denominator. CONTRAST:  156mL OMNIPAQUE IOHEXOL 350 MG/ML SOLN COMPARISON:  Brain MRI 1305 hours today and earlier. Neck MRA 05/03/2018. FINDINGS: CT HEAD Brain: Patchy and confluent hypodensity in the right MCA territory corresponding to the various  ischemia. No associated hemorrhage. No associated mass effect. No ventriculomegaly. Stable gray-white matter differentiation elsewhere since 12/23/2018. Left anterior paraspinal seen meningioma measuring about 2.7 centimeters diameter redemonstrated on series 5, image 9.  Calvarium and skull base: No acute osseous abnormality identified. Paranasal sinuses: Visualized paranasal sinuses and mastoids are stable and well pneumatized. Orbits: No acute orbit or scalp soft tissue findings. CTA NECK Skeleton: Severe cervical spine degeneration superimposed on congenital appearing incomplete segmentation of C5-C6. Partially visible thoracic scoliosis and right shoulder arthroplasty. No acute osseous abnormality identified. Upper chest: Negative. Other neck: Negative. Aortic arch: Mild to moderate arch atherosclerosis. Three vessel arch configuration. Right carotid system: No brachiocephalic or right CCA origin stenosis. Mild soft and calcified plaque in the right CCA proximal to the bifurcation. There is confluent calcification of the right ICA origin resulting in near occlusion of the vessel, string sign type reconstitution occurs at the bulb (series 13, image 65), and the vessel is patent but highly diminutive and thread-like to the skull base. See series 9, image 65. Left carotid system: No left CCA origin stenosis and minimal plaque proximal to the left carotid bifurcation. The left ICA origin is also heavily calcified with string sign stenosis. The distal bulb is diminutive, only 2-3 millimeters diameter. However, the remaining cervical left ICA has a more normal caliber and is patent to the skull base without additional stenosis. Vertebral arteries: No proximal right subclavian artery stenosis despite plaque. The right vertebral artery is non dominant. Calcified plaque near the right vertebral origin results in no significant stenosis. The right vertebral is patent to the skull base without stenosis. No proximal left subclavian artery stenosis despite soft and calcified plaque. Dominant left vertebral artery with a normal origin. The left vertebral is patent to the skull base without stenosis. CTA HEAD Posterior circulation: Mildly dominant left V4 segment. No distal vertebral stenosis.  Normal right PICA origin. The left AICA appears dominant and patent. Patent vertebrobasilar junction and basilar artery without stenosis. Patent AICA and SCA origins. Patent right PCA origin with mild irregularity but no significant stenosis. There is a fetal type left PCA. The right posterior communicating artery is diminutive or absent. Left PCA branches are within normal limits. There is additional moderate stenosis in the right PCA P2 segment, but the distal right PCA is within normal limits. Anterior circulation: Thread-like enhancement of the right ICA at the skull base is noted but the right siphon is occluded in the distal petrous segment. Enhancement is reconstituted at the distal cavernous segment and the supraclinoid right ICA is patent although with asymmetrically decreased enhancement. The right ICA terminus is patent. The right A1 and M1 are patent but diminutive (series 15, image 16). The right MCA bifurcation is patent. The right MCA branches are patent but diminutive (series 14, image 19). The left ICA siphon is patent without stenosis despite some calcified plaque. Normal left posterior communicating artery origin. Normal left ICA terminus, left ACA origin. An anterior communicating artery is identified, although the left ACA remains larger than the right throughout. The ACA branches are within normal limits except for some mass effect related to the para falcine meningioma. The left MCA origin is normal with mild irregularity and stenosis in the left M1. The left MCA bifurcation is patent without stenosis. Left MCA branches are within normal limits. Venous sinuses: Patent. Anatomic variants: Dominant left vertebral artery. Review of the MIP images confirms the above findings IMPRESSION: 1. Positive for BILATERAL Radiographic-String-Sign stenoses of BOTH ICA origins  due to bulky calcification. - the Right ICA is thread-like distal to the severe stenosis, and occludes in the mid right siphon. -  there is suboptimal reconstitution of the distal right ICA (see #2) and the right MCA and right ACA are patent but diminutive. - the left ICA maintains a more normal caliber distal to the severe stenosis. And the left anterior circulation is largely normal. 2. There is no vertebral or basilar artery stenosis, but the Right Pcomm is diminutive or absent. There is up to moderate right PCA stenosis. 3. Expected CT appearance of right MCA ischemia. No intracranial hemorrhage or mass effect. 4. Chronic left para falcine meningioma. 5. 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 Electronically Signed: By: Genevie Ann M.D. On: 12/29/2018 23:29   Ct Angio Neck W Or Wo Contrast  Addendum Date: 12/29/2018   ADDENDUM REPORT: 12/29/2018 23:52 ADDENDUM: Study discussed by telephone with Dr. Jani Gravel on 12/29/2018 at 2337 hours. Electronically Signed   By: Genevie Ann M.D.   On: 12/29/2018 23:52   Result Date: 12/29/2018 CLINICAL DATA:  85 year old female with acute, subacute, and chronic right MCA territory infarcts. 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 EXAM: CT ANGIOGRAPHY HEAD AND NECK TECHNIQUE: Multidetector CT imaging of the head and neck was performed using the standard protocol during bolus administration of intravenous contrast. Multiplanar CT image reconstructions and MIPs were obtained to evaluate the vascular anatomy. Carotid stenosis measurements (when applicable) are obtained utilizing NASCET criteria, using the distal internal carotid diameter as the denominator. CONTRAST:  136mL OMNIPAQUE IOHEXOL 350 MG/ML SOLN COMPARISON:  Brain MRI 1305 hours today and earlier. Neck MRA 05/03/2018. FINDINGS: CT HEAD Brain: Patchy and confluent hypodensity in the right MCA territory corresponding to the various ischemia. No associated hemorrhage. No associated mass effect. No ventriculomegaly. Stable gray-white matter differentiation elsewhere since 12/23/2018.  Left anterior paraspinal seen meningioma measuring about 2.7 centimeters diameter redemonstrated on series 5, image 9. Calvarium and skull base: No acute osseous abnormality identified. Paranasal sinuses: Visualized paranasal sinuses and mastoids are stable and well pneumatized. Orbits: No acute orbit or scalp soft tissue findings. CTA NECK Skeleton: Severe cervical spine degeneration superimposed on congenital appearing incomplete segmentation of C5-C6. Partially visible thoracic scoliosis and right shoulder arthroplasty. No acute osseous abnormality identified. Upper chest: Negative. Other neck: Negative. Aortic arch: Mild to moderate arch atherosclerosis. Three vessel arch configuration. Right carotid system: No brachiocephalic or right CCA origin stenosis. Mild soft and calcified plaque in the right CCA proximal to the bifurcation. There is confluent calcification of the right ICA origin resulting in near occlusion of the vessel, string sign type reconstitution occurs at the bulb (series 13, image 65), and the vessel is patent but highly diminutive and thread-like to the skull base. See series 9, image 65. Left carotid system: No left CCA origin stenosis and minimal plaque proximal to the left carotid bifurcation. The left ICA origin is also heavily calcified with string sign stenosis. The distal bulb is diminutive, only 2-3 millimeters diameter. However, the remaining cervical left ICA has a more normal caliber and is patent to the skull base without additional stenosis. Vertebral arteries: No proximal right subclavian artery stenosis despite plaque. The right vertebral artery is non dominant. Calcified plaque near the right vertebral origin results in no significant stenosis. The right vertebral is patent to the skull base without stenosis. No proximal left subclavian artery stenosis despite soft and calcified plaque. Dominant left vertebral artery  with a normal origin. The left vertebral is patent to the  skull base without stenosis. CTA HEAD Posterior circulation: Mildly dominant left V4 segment. No distal vertebral stenosis. Normal right PICA origin. The left AICA appears dominant and patent. Patent vertebrobasilar junction and basilar artery without stenosis. Patent AICA and SCA origins. Patent right PCA origin with mild irregularity but no significant stenosis. There is a fetal type left PCA. The right posterior communicating artery is diminutive or absent. Left PCA branches are within normal limits. There is additional moderate stenosis in the right PCA P2 segment, but the distal right PCA is within normal limits. Anterior circulation: Thread-like enhancement of the right ICA at the skull base is noted but the right siphon is occluded in the distal petrous segment. Enhancement is reconstituted at the distal cavernous segment and the supraclinoid right ICA is patent although with asymmetrically decreased enhancement. The right ICA terminus is patent. The right A1 and M1 are patent but diminutive (series 15, image 16). The right MCA bifurcation is patent. The right MCA branches are patent but diminutive (series 14, image 19). The left ICA siphon is patent without stenosis despite some calcified plaque. Normal left posterior communicating artery origin. Normal left ICA terminus, left ACA origin. An anterior communicating artery is identified, although the left ACA remains larger than the right throughout. The ACA branches are within normal limits except for some mass effect related to the para falcine meningioma. The left MCA origin is normal with mild irregularity and stenosis in the left M1. The left MCA bifurcation is patent without stenosis. Left MCA branches are within normal limits. Venous sinuses: Patent. Anatomic variants: Dominant left vertebral artery. Review of the MIP images confirms the above findings IMPRESSION: 1. Positive for BILATERAL Radiographic-String-Sign stenoses of BOTH ICA origins due to  bulky calcification. - the Right ICA is thread-like distal to the severe stenosis, and occludes in the mid right siphon. - there is suboptimal reconstitution of the distal right ICA (see #2) and the right MCA and right ACA are patent but diminutive. - the left ICA maintains a more normal caliber distal to the severe stenosis. And the left anterior circulation is largely normal. 2. There is no vertebral or basilar artery stenosis, but the Right Pcomm is diminutive or absent. There is up to moderate right PCA stenosis. 3. Expected CT appearance of right MCA ischemia. No intracranial hemorrhage or mass effect. 4. Chronic left para falcine meningioma. 5. 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 Electronically Signed: By: Genevie Ann M.D. On: 12/29/2018 23:29   Mr Brain Wo Contrast  Result Date: 12/29/2018 CLINICAL DATA:  Leg weakness with difficulty walking. Recent stroke. EXAM: MRI HEAD WITHOUT CONTRAST TECHNIQUE: Multiplanar, multiecho pulse sequences of the brain and surrounding structures were obtained without intravenous contrast. COMPARISON:  12/23/2018 FINDINGS: Brain: Many of the small acute right MCA infarcts on the recent prior MRI demonstrate diminished diffusion signal abnormality. However, there are multiple new acute right MCA infarcts involving frontal and parietal cortex which are overall larger than those on the prior study including a 3 cm infarct in the right precentral gyrus in the hand motor region which extends inferiorly into the centrum semiovale. There is no associated hemorrhage. A chronic right MCA infarct is again noted involving the insula, basal ganglia, and frontal lobe with hemosiderin staining and ex vacuo dilatation of the right frontal horn. An extra-axial left frontal mass anteriorly along the falx is unchanged, measuring 2.1 x 2.8  x 3.6 cm with local mass effect but no significant edema. There is no midline shift or extra-axial  fluid collection. Mild generalized cerebral atrophy is not greater than expected for age. Vascular: Chronic right ICA occlusion. Skull and upper cervical spine: Unremarkable bone marrow signal. Sinuses/Orbits: Bilateral cataract extraction. Paranasal sinuses and mastoid air cells are clear. Other: None. IMPRESSION: 1. Multiple new acute right MCA infarcts involving the frontal and parietal lobes. 2. Subacute and chronic infarcts as above. 3. Unchanged left para falcine meningioma. 4. Chronic occlusion of the right ICA. Electronically Signed   By: Logan Bores M.D.   On: 12/29/2018 17:00   Mr Cervical Spine W Wo Contrast  Result Date: 12/29/2018 CLINICAL DATA:  Left upper extremity weakness. EXAM: MRI CERVICAL SPINE WITHOUT AND WITH CONTRAST TECHNIQUE: Multiplanar and multiecho pulse sequences of the cervical spine, to include the craniocervical junction and cervicothoracic junction, were obtained without and with intravenous contrast. CONTRAST:  6 cc Gadavist COMPARISON:  Cervical spine CT scan from 02/05/2017 FINDINGS: Alignment: Severe degenerative cervical spondylosis with multilevel disc disease and facet disease. Multilevel degenerative subluxations are noted. 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 Vertebrae: No bone lesions or fractures. Cord: Normal MR appearance of the cervical spinal cord. No cord lesions or syrinx. No areas of abnormal cord enhancement. Posterior Fossa, vertebral arteries, paraspinal tissues: No significant findings. Disc levels: C2-3: No significant findings. C3-4: Degenerative disc disease and facet disease with a bulging annulus and osteophytic ridging. No significant spinal stenosis. Left-sided disc osteophyte complex with mild left foraminal stenosis. C4-5: Advanced degenerative disc disease with a bulging degenerated annulus, osteophytic ridging and uncinate spurring. There is flattening of the ventral thecal sac and narrowing the ventral CSF space. No significant foraminal  stenosis. C5-6: Klippel-Feil anomaly with partial fusion. No spinal or foraminal stenosis. C6-7: Advanced degenerative disc disease with a bulging degenerated annulus, osteophytic ridging and uncinate spurring. There is flattening of the ventral thecal sac and near obliteration of the ventral CSF space. Mild foraminal narrowing bilaterally due to uncinate spurring. C7-T1: Bulging annulus and facet disease but no disc protrusions or spinal stenosis. Mild bilateral foraminal stenosis. IMPRESSION: 1. Advanced degenerative cervical spondylosis with multilevel disc disease and facet disease. 2. 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 3. Normal MR appearance of the cervical spinal cord. 4. Mild spinal and bilateral foraminal stenosis at C6-7. 5. Mild left foraminal stenosis at C3-4. 6. Mild bilateral foraminal stenosis at C7-T1. Electronically Signed   By: Marijo Sanes M.D.   On: 12/29/2018 16:51    Assessment: 85 y.o. female with severe bilateral ICA stenoses, presenting with multiple acute right MCA infarcts. She was transferred to [facility] for possible CEA.  1. MRI brain at OSH revealed multiple new acute right MCA infarcts involving the frontal and parietal lobes. Subacute and chronic infarcts were also noted. . 2. CTA of head and neck showed bilateral string-sign stenoses of BOTH ICA origins due to bulky calcification. Distal ICA and related intracranial findings were as follows:  Right ICA: Thread-like distal to the severe stenosis, and occludes in the mid right siphon. There is suboptimal reconstitution of the distal right ICA and the right MCA and right ACA are patent but diminutive.  Left ICA: Maintains a more normal caliber distal to the severe stenosis. And the left anterior circulation is largely normal. 3. Stroke Risk Factors - HLD, HTN, DM2, prior strokes 4. Echocardiogram showed no mural thrombus 5. Carotid ultrasound revealed a chronically occluded right ICA as before, but this may be an overcall  given the  string sign distal to the severe proximal ICA stenosis seen on CTA, which implies some proximal right ICA flow. Also seen on carotid ultrasound was moderate left ICA atherosclerosis, with left ICA stenosis estimated at 50-69%  Plan: 1. Vascular surgery consult 2. Frequent neuro checks  3. PT consult, OT consult, Speech consult 4. Telemetry monitoring 5. Restart Lipitor 40 mg po qd 6. Continue ASA and Plavix 7. Keep SBP goal of 140-160 8. Stroke team to follow in AM.   @Electronically  signed: Dr. Kerney Elbe  12/30/2018, 5:47 AM

## 2018-12-30 NOTE — H&P (Signed)
Chief Complaint: This is documentation of history and physical done to my office yesterday. HPI: She is an 85 year old who has multiple medical problems including diabetes previous stroke hyperlipidemia hypertension.  She had been in the hospital with what appeared to be another stroke with left arm weakness and she had recovered except for she still had neglect of her left arm and still had weakness of her left arm.  However when she got home she developed trouble walking with her left leg.  Her husband called the office and I advised him to take her to the emergency department which he did.  She was evaluated in the emergency department and discharged.  I discussed that situation with him and told him to bring her to my office and I would directly admit her which I did.  She still has some weakness of her left leg.  She still has weakness of her left arm and has neglect of her left arm.  She denies any chest pain nausea vomiting diarrhea headache blurred vision change in her vision urinary symptoms.  Past Medical History:  Diagnosis Date  . Arthritis    "some joint pain once in awhile" (05/26/2017)  . CVA (cerebral vascular accident) ([place]) 2019  . History of kidney stones   . Hyperlipemia   . Hypertension   . Hypothyroidism (acquired)   . Pneumothorax 02/05/2017   right-after fall  . Rib fractures 02/05/2017   right side  . Stroke ([place])   . Type II diabetes mellitus ([place])    Past Surgical History:  Procedure Laterality Date  . AUGMENTATION MAMMAPLASTY Bilateral   . DILATION AND CURETTAGE OF UTERUS    . FRACTURE SURGERY    . REVERSE SHOULDER ARTHROPLASTY Right 05/26/2017  . REVERSE SHOULDER ARTHROPLASTY Right 05/26/2017   Procedure: REVERSE RIGHT SHOULDER ARTHROPLASTY;  Surgeon: Meredith Pel, MD;  Location: [facility];  Service: Orthopedics;  Laterality: Right;  . TONSILLECTOMY    . TUBAL LIGATION          History reviewed. No pertinent family history. No known family history of  stroke Social History:  reports that she quit smoking about 29 years ago. Her smoking use included cigarettes. She has a 40.00 pack-year smoking history. She has never used smokeless tobacco. She reports that she does not drink alcohol or use drugs.   Allergies:  Allergies  Allergen Reactions  . Fosamax [Alendronate Sodium] Other (See Comments)    MUSCLE ACHES  . Prednisone     Feels jittery and nauseous    Medications Prior to Admission  Medication Sig Dispense Refill  . amLODipine (NORVASC) 5 MG tablet Take 1 tablet (5 mg total) by mouth daily. 30 tablet 11  . aspirin 325 MG tablet Take 1 tablet (325 mg total) by mouth daily. 30 tablet 12  . atorvastatin (LIPITOR) 40 MG tablet Take 1 tablet (40 mg total) by mouth daily at 6 PM. 30 tablet 0  . Calcium Carb-Cholecalciferol (CALCIUM 600 + D PO) Take 1 tablet by mouth daily.    Marland Kitchen guaiFENesin (MUCINEX) 600 MG 12 hr tablet Take 2 tablets (1,200 mg total) by mouth 2 (two) times daily. 30 tablet 1  . insulin lispro (HUMALOG) 100 UNIT/ML injection Inject 12 Units into the skin once.     . Multiple Vitamin (MULTIVITAMIN WITH MINERALS) TABS Take 1 tablet by mouth daily.     . multivitamin-lutein (OCUVITE-LUTEIN) CAPS capsule Take 1 capsule by mouth daily.    . Omega-3 Fatty Acids (FISH OIL)  1000 MG CAPS Take 1,000 mg by mouth daily.    Marland Kitchen SYNTHROID 100 MCG tablet Take 100 mcg by mouth every morning.     Marland Kitchen TOUJEO SOLOSTAR 300 UNIT/ML SOPN Inject 20-22 Units into the skin every morning.     . vitamin B-12 (CYANOCOBALAMIN) 1000 MCG tablet Take 1,000 mcg by mouth daily.         DXI:PJASN from the symptoms mentioned above,there are no other symptoms referable to all systems reviewed.  10 point review of systems otherwise negative  Physical Exam: Blood pressure 140/62, pulse 66, temperature 98.1 F (36.7 C), temperature source Oral, resp. rate 13, height 5\' 5"  (1.651 m), weight 60.1 kg, SpO2 97 %. Constitutional: She is awake and alert.  Eyes:  Pupils react.  EOMI.  Ears nose mouth and throat: Mucous membranes are dry.  Neck is supple.  Hearing is grossly normal.  Cardiovascular: Her heart is regular with normal heart sounds.  Respiratory: Respiratory effort is normal and her lungs are clear.  Gastrointestinal: Her abdomen is soft with no masses.  Skin: Warm and dry neurological: She has pretty much flaccid paralysis of her left arm and still has neglect of her left arm.  She has decreased strength of her left leg at about 2 out of 5 and has trouble with coordination of her left leg.  Psychiatric: Normal mood and affect   Recent Labs    12/29/18 1541  WBC 6.0  NEUTROABS 3.8  HGB 11.9*  HCT 37.4  MCV 93.7  PLT 298   Recent Labs    12/29/18 1541  NA 134*  K 4.5  CL 100  CO2 23  GLUCOSE 595*  BUN 23  CREATININE 0.98  CALCIUM 9.1  lablast2(ast:2,ALT:2,alkphos:2,bilitot:2,prot:2,albumin:2)@    Recent Results (from the past 240 hour(s))  SARS CORONAVIRUS 2 Nasal Swab Aptima Multi Swab     Status: None   Collection Time: 12/23/18  9:57 AM   Specimen: Aptima Multi Swab; Nasal Swab  Result Value Ref Range Status   SARS Coronavirus 2 NEGATIVE NEGATIVE Final    Comment: (NOTE) SARS-CoV-2 target nucleic acids are NOT DETECTED. The SARS-CoV-2 RNA is generally detectable in upper and lower respiratory specimens during the acute phase of infection. Negative results do not preclude SARS-CoV-2 infection, do not rule out co-infections with other pathogens, and should not be used as the sole basis for treatment or other patient management decisions. Negative results must be combined with clinical observations, patient history, and epidemiological information. The expected result is Negative. Fact Sheet for Patients: SugarRoll.be Fact Sheet for Healthcare Providers: https://www.woods-mathews.com/ This test is not yet approved or cleared by the [place] FDA and  has been authorized  for detection and/or diagnosis of SARS-CoV-2 by FDA under an Emergency Use Authorization (EUA). This EUA will remain  in effect (meaning this test can be used) for the duration of the COVID-19 declaration under Section 56 4(b)(1) of the Act, 21 U.S.C. section 360bbb-3(b)(1), unless the authorization is terminated or revoked sooner. Performed at [facility], [place] [address] [place], [place] [address]   SARS Coronavirus 2 [facility] order, Performed in [facility] hospital lab)     Status: None   Collection Time: 12/29/18  7:10 PM  Result Value Ref Range Status   SARS Coronavirus 2 NEGATIVE NEGATIVE Final    Comment: (NOTE) If result is NEGATIVE SARS-CoV-2 target nucleic acids are NOT DETECTED. The SARS-CoV-2 RNA is generally detectable in upper and lower  respiratory specimens during the  acute phase of infection. The lowest  concentration of SARS-CoV-2 viral copies this assay can detect is 250  copies / mL. A negative result does not preclude SARS-CoV-2 infection  and should not be used as the sole basis for treatment or other  patient management decisions.  A negative result may occur with  improper specimen collection / handling, submission of specimen other  than nasopharyngeal swab, presence of viral mutation(s) within the  areas targeted by this assay, and inadequate number of viral copies  (<250 copies / mL). A negative result must be combined with clinical  observations, patient history, and epidemiological information. If result is POSITIVE SARS-CoV-2 target nucleic acids are DETECTED. The SARS-CoV-2 RNA is generally detectable in upper and lower  respiratory specimens dur ing the acute phase of infection.  Positive  results are indicative of active infection with SARS-CoV-2.  Clinical  correlation with patient history and other diagnostic information is  necessary to determine patient infection status.  Positive results do  not rule out bacterial infection or  co-infection with other viruses. If result is PRESUMPTIVE POSTIVE SARS-CoV-2 nucleic acids MAY BE PRESENT.   A presumptive positive result was obtained on the submitted specimen  and confirmed on repeat testing.  While 2019 novel coronavirus  (SARS-CoV-2) nucleic acids may be present in the submitted sample  additional confirmatory testing may be necessary for epidemiological  and / or clinical management purposes  to differentiate between  SARS-CoV-2 and other Sarbecovirus currently known to infect humans.  If clinically indicated additional testing with an alternate test  methodology [phone]) is advised. The SARS-CoV-2 RNA is generally  detectable in upper and lower respiratory sp ecimens during the acute  phase of infection. The expected result is Negative. Fact Sheet for Patients:  StrictlyIdeas.no Fact Sheet for Healthcare Providers: BankingDealers.co.za This test is not yet approved or cleared by the [place] FDA and has been authorized for detection and/or diagnosis of SARS-CoV-2 by FDA under an Emergency Use Authorization (EUA).  This EUA will remain in effect (meaning this test can be used) for the duration of the COVID-19 declaration under Section 564(b)(1) of the Act, 21 U.S.C. section 360bbb-3(b)(1), unless the authorization is terminated or revoked sooner. Performed at [facility], [address] [place], [place] [address]      Ct Angio Head W Or Wo Contrast  Addendum Date: 12/29/2018   ADDENDUM REPORT: 12/29/2018 23:52 ADDENDUM: Study discussed by telephone with Dr. Jani Gravel on 12/29/2018 at 2337 hours. Electronically Signed   By: Genevie Ann M.D.   On: 12/29/2018 23:52   Result Date: 12/29/2018 CLINICAL DATA:  85 year old female with acute, subacute, and chronic right MCA territory infarcts. 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 EXAM: CT ANGIOGRAPHY HEAD AND NECK TECHNIQUE: Multidetector CT imaging of the  head and neck was performed using the standard protocol during bolus administration of intravenous contrast. Multiplanar CT image reconstructions and MIPs were obtained to evaluate the vascular anatomy. Carotid stenosis measurements (when applicable) are obtained utilizing NASCET criteria, using the distal internal carotid diameter as the denominator. CONTRAST:  110mL OMNIPAQUE IOHEXOL 350 MG/ML SOLN COMPARISON:  Brain MRI 1305 hours today and earlier. Neck MRA 05/03/2018. FINDINGS: CT HEAD Brain: Patchy and confluent hypodensity in the right MCA territory corresponding to the various ischemia. No associated hemorrhage. No associated mass effect. No ventriculomegaly. Stable gray-white matter differentiation elsewhere since 12/23/2018. Left anterior paraspinal seen meningioma measuring about 2.7 centimeters diameter redemonstrated on series 5, image 9.  Calvarium and skull base: No acute osseous abnormality identified. Paranasal sinuses: Visualized paranasal sinuses and mastoids are stable and well pneumatized. Orbits: No acute orbit or scalp soft tissue findings. CTA NECK Skeleton: Severe cervical spine degeneration superimposed on congenital appearing incomplete segmentation of C5-C6. Partially visible thoracic scoliosis and right shoulder arthroplasty. No acute osseous abnormality identified. Upper chest: Negative. Other neck: Negative. Aortic arch: Mild to moderate arch atherosclerosis. Three vessel arch configuration. Right carotid system: No brachiocephalic or right CCA origin stenosis. Mild soft and calcified plaque in the right CCA proximal to the bifurcation. There is confluent calcification of the right ICA origin resulting in near occlusion of the vessel, string sign type reconstitution occurs at the bulb (series 13, image 65), and the vessel is patent but highly diminutive and thread-like to the skull base. See series 9, image 65. Left carotid system: No left CCA origin stenosis and minimal plaque proximal  to the left carotid bifurcation. The left ICA origin is also heavily calcified with string sign stenosis. The distal bulb is diminutive, only 2-3 millimeters diameter. However, the remaining cervical left ICA has a more normal caliber and is patent to the skull base without additional stenosis. Vertebral arteries: No proximal right subclavian artery stenosis despite plaque. The right vertebral artery is non dominant. Calcified plaque near the right vertebral origin results in no significant stenosis. The right vertebral is patent to the skull base without stenosis. No proximal left subclavian artery stenosis despite soft and calcified plaque. Dominant left vertebral artery with a normal origin. The left vertebral is patent to the skull base without stenosis. CTA HEAD Posterior circulation: Mildly dominant left V4 segment. No distal vertebral stenosis. Normal right PICA origin. The left AICA appears dominant and patent. Patent vertebrobasilar junction and basilar artery without stenosis. Patent AICA and SCA origins. Patent right PCA origin with mild irregularity but no significant stenosis. There is a fetal type left PCA. The right posterior communicating artery is diminutive or absent. Left PCA branches are within normal limits. There is additional moderate stenosis in the right PCA P2 segment, but the distal right PCA is within normal limits. Anterior circulation: Thread-like enhancement of the right ICA at the skull base is noted but the right siphon is occluded in the distal petrous segment. Enhancement is reconstituted at the distal cavernous segment and the supraclinoid right ICA is patent although with asymmetrically decreased enhancement. The right ICA terminus is patent. The right A1 and M1 are patent but diminutive (series 15, image 16). The right MCA bifurcation is patent. The right MCA branches are patent but diminutive (series 14, image 19). The left ICA siphon is patent without stenosis despite some  calcified plaque. Normal left posterior communicating artery origin. Normal left ICA terminus, left ACA origin. An anterior communicating artery is identified, although the left ACA remains larger than the right throughout. The ACA branches are within normal limits except for some mass effect related to the para falcine meningioma. The left MCA origin is normal with mild irregularity and stenosis in the left M1. The left MCA bifurcation is patent without stenosis. Left MCA branches are within normal limits. Venous sinuses: Patent. Anatomic variants: Dominant left vertebral artery. Review of the MIP images confirms the above findings IMPRESSION: 1. 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 - the Right ICA is thread-like distal to the severe stenosis, and occludes in the mid right siphon. - there is suboptimal reconstitution of the distal right ICA (see #  2) and the right MCA and right ACA are patent but diminutive. - the left ICA maintains a more normal caliber distal to the severe stenosis. And the left anterior circulation is largely normal. 2. There is no vertebral or basilar artery stenosis, but the Right Pcomm is diminutive or absent. There is up to moderate right PCA stenosis. 3. Expected CT appearance of right MCA ischemia. No intracranial hemorrhage or mass effect. 4. Chronic left para falcine meningioma. 5. 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 Electronically Signed: By: Genevie Ann M.D. On: 12/29/2018 23:29   Ct Head Wo Contrast  Result Date: 12/23/2018 CLINICAL DATA:  Left upper extremity weakness EXAM: CT HEAD WITHOUT CONTRAST TECHNIQUE: Contiguous axial images were obtained from the base of the skull through the vertex without intravenous contrast. COMPARISON:  09/27/2018 FINDINGS: Brain: Redemonstration of a mass arising from the anterior falx on the left measuring approximately 2.6 x  2.8 cm, stable in size compared to prior. Internal focus of low-density likely internal cystic change, also stable from prior. Mass results in mild mass effect on the anterior horn of the left lateral ventricle. No appreciable surrounding edema. Encephalomalacia in the right frontal lobe from prior right MCA distribution infarct. Additional areas of encephalomalacia in the right basal ganglia from prior infarcts, also unchanged. No evidence of new infarct, hemorrhage, or extra-axial fluid collection. Scattered low-density changes within the periventricular and subcortical white matter compatible with chronic microvascular ischemic change. Ventricular size and configuration is unchanged. Vascular: Atherosclerotic calcifications involving the large vessels of the skull base. No unexpected hyperdense vessel. Skull: Normal. Negative for fracture or focal lesion. Sinuses/Orbits: No acute finding. Other: None. IMPRESSION: 1. No evidence of an acute intracranial abnormality. 2. Stable size of left anterior parafalcine meningioma. 3. Chronic microvascular ischemic changes as well as prior right MCA distribution infarct and prior right basal ganglia lacunar infarcts. Electronically Signed   By: Davina Poke M.D.   On: 12/23/2018 12:02   Ct Angio Neck W Or Wo Contrast  Addendum Date: 12/29/2018   ADDENDUM REPORT: 12/29/2018 23:52 ADDENDUM: Study discussed by telephone with Dr. Jani Gravel on 12/29/2018 at 2337 hours. Electronically Signed   By: Genevie Ann M.D.   On: 12/29/2018 23:52   Result Date: 12/29/2018 CLINICAL DATA:  85 year old female with acute, subacute, and chronic right MCA territory infarcts. 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 EXAM: CT ANGIOGRAPHY HEAD AND NECK TECHNIQUE: Multidetector CT imaging of the head and neck was performed using the standard protocol during bolus administration of intravenous contrast. Multiplanar CT image reconstructions and MIPs were obtained to evaluate the  vascular anatomy. Carotid stenosis measurements (when applicable) are obtained utilizing NASCET criteria, using the distal internal carotid diameter as the denominator. CONTRAST:  181mL OMNIPAQUE IOHEXOL 350 MG/ML SOLN COMPARISON:  Brain MRI 1305 hours today and earlier. Neck MRA 05/03/2018. FINDINGS: CT HEAD Brain: Patchy and confluent hypodensity in the right MCA territory corresponding to the various ischemia. No associated hemorrhage. No associated mass effect. No ventriculomegaly. Stable gray-white matter differentiation elsewhere since 12/23/2018. Left anterior paraspinal seen meningioma measuring about 2.7 centimeters diameter redemonstrated on series 5, image 9. Calvarium and skull base: No acute osseous abnormality identified. Paranasal sinuses: Visualized paranasal sinuses and mastoids are stable and well pneumatized. Orbits: No acute orbit or scalp soft tissue findings. CTA NECK Skeleton: Severe cervical spine degeneration superimposed on congenital appearing incomplete segmentation of C5-C6. Partially visible thoracic scoliosis and right shoulder arthroplasty. No  acute osseous abnormality identified. Upper chest: Negative. Other neck: Negative. Aortic arch: Mild to moderate arch atherosclerosis. Three vessel arch configuration. Right carotid system: No brachiocephalic or right CCA origin stenosis. Mild soft and calcified plaque in the right CCA proximal to the bifurcation. There is confluent calcification of the right ICA origin resulting in near occlusion of the vessel, string sign type reconstitution occurs at the bulb (series 13, image 65), and the vessel is patent but highly diminutive and thread-like to the skull base. See series 9, image 65. Left carotid system: No left CCA origin stenosis and minimal plaque proximal to the left carotid bifurcation. The left ICA origin is also heavily calcified with string sign stenosis. The distal bulb is diminutive, only 2-3 millimeters diameter. However, the  remaining cervical left ICA has a more normal caliber and is patent to the skull base without additional stenosis. Vertebral arteries: No proximal right subclavian artery stenosis despite plaque. The right vertebral artery is non dominant. Calcified plaque near the right vertebral origin results in no significant stenosis. The right vertebral is patent to the skull base without stenosis. No proximal left subclavian artery stenosis despite soft and calcified plaque. Dominant left vertebral artery with a normal origin. The left vertebral is patent to the skull base without stenosis. CTA HEAD Posterior circulation: Mildly dominant left V4 segment. No distal vertebral stenosis. Normal right PICA origin. The left AICA appears dominant and patent. Patent vertebrobasilar junction and basilar artery without stenosis. Patent AICA and SCA origins. Patent right PCA origin with mild irregularity but no significant stenosis. There is a fetal type left PCA. The right posterior communicating artery is diminutive or absent. Left PCA branches are within normal limits. There is additional moderate stenosis in the right PCA P2 segment, but the distal right PCA is within normal limits. Anterior circulation: Thread-like enhancement of the right ICA at the skull base is noted but the right siphon is occluded in the distal petrous segment. Enhancement is reconstituted at the distal cavernous segment and the supraclinoid right ICA is patent although with asymmetrically decreased enhancement. The right ICA terminus is patent. The right A1 and M1 are patent but diminutive (series 15, image 16). The right MCA bifurcation is patent. The right MCA branches are patent but diminutive (series 14, image 19). The left ICA siphon is patent without stenosis despite some calcified plaque. Normal left posterior communicating artery origin. Normal left ICA terminus, left ACA origin. An anterior communicating artery is identified, although the left ACA  remains larger than the right throughout. The ACA branches are within normal limits except for some mass effect related to the para falcine meningioma. The left MCA origin is normal with mild irregularity and stenosis in the left M1. The left MCA bifurcation is patent without stenosis. Left MCA branches are within normal limits. Venous sinuses: Patent. Anatomic variants: Dominant left vertebral artery. Review of the MIP images confirms the above findings IMPRESSION: 1. 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 - the Right ICA is thread-like distal to the severe stenosis, and occludes in the mid right siphon. - there is suboptimal reconstitution of the distal right ICA (see #2) and the right MCA and right ACA are patent but diminutive. - the left ICA maintains a more normal caliber distal to the severe stenosis. And the left anterior circulation is largely normal. 2. There is no vertebral or basilar artery stenosis, but the Right Pcomm is diminutive or absent. There is up  to moderate right PCA stenosis. 3. Expected CT appearance of right MCA ischemia. No intracranial hemorrhage or mass effect. 4. Chronic left para falcine meningioma. 5. 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 Electronically Signed: By: Genevie Ann M.D. On: 12/29/2018 23:29   Mr Angio Head Wo Contrast  Result Date: 12/23/2018 CLINICAL DATA:  Left arm weakness. EXAM: MRI HEAD WITHOUT CONTRAST MRA HEAD WITHOUT CONTRAST TECHNIQUE: Multiplanar, multiecho pulse sequences of the brain and surrounding structures were obtained without intravenous contrast. Angiographic images of the head were obtained using MRA technique without contrast. COMPARISON:  Head CT 12/23/2018.  Head MRI and MRA 05/03/2018. FINDINGS: MRI HEAD FINDINGS Brain: There are multiple small foci of restricted diffusion in the right MCA territory consistent with acute  infarcts primarily involving cortex of the right frontal and parietal lobes. A chronic right MCA infarct is again noted involving the insula, basal ganglia, and frontal lobe with ex vacuo dilatation of the right frontal horn and hemosiderin staining. Scattered T2 hyperintensities elsewhere in the cerebral white matter bilaterally are similar to the prior MRI and nonspecific but compatible with mild chronic small vessel ischemic disease. An extra-axial mass anteriorly along the falx on the left has not significantly changed in size from the prior MRI and measures 2.2 x 2.8 x 3.7 cm. There is local mass effect on the left frontal lobe without edema. Mild generalized cerebral atrophy is not greater than expected for age. Vascular: Chronic right ICA occlusion. Skull and upper cervical spine: Unremarkable bone marrow signal. Sinuses/Orbits: Bilateral cataract extraction. Paranasal sinuses and mastoid air cells are clear. Other: None. MRA HEAD FINDINGS The visualized distal vertebral arteries are patent to the basilar with the left being dominant. The basilar artery is patent with minimal narrowing distally. There is a large left posterior communicating artery. Severe bilateral P2 stenoses are again noted. There is chronic occlusion of the right ICA with distal reconstitution near the terminus. The right M1 and proximal M2 segments are poorly visualized, more so than on the prior MRA and suggesting severely reduced flow. There is some reconstitution of more robust flow related enhancement in the more distal right MCA branches. No significant flow related enhancement is apparent today in the right A1 segment which may reflect further reduced flow or interval occlusion. The right A2 segment is patent and supplied by the left ACA via the anterior communicating artery. The left intracranial ICA is widely patent. The left ACA and left MCA are patent without evidence of proximal branch occlusion or significant proximal  stenosis. No aneurysm is identified. IMPRESSION: 1. Multiple small acute right MCA infarcts. 2. Chronic right MCA infarct. 3. 3.7 cm left anterior parafalcine meningioma, not significantly changed in size from 2019 and without edema. 4. 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 Severely diminished flow in the proximal right MCA and possible interval occlusion of the right A1 segment. Electronically Signed   By: Logan Bores M.D.   On: 12/23/2018 14:57   Mr Brain Wo Contrast  Result Date: 12/29/2018 CLINICAL DATA:  Leg weakness with difficulty walking. Recent stroke. EXAM: MRI HEAD WITHOUT CONTRAST TECHNIQUE: Multiplanar, multiecho pulse sequences of the brain and surrounding structures were obtained without intravenous contrast. COMPARISON:  12/23/2018 FINDINGS: Brain: Many of the small acute right MCA infarcts on the recent prior MRI demonstrate diminished diffusion signal abnormality. However, there are multiple new acute right MCA infarcts involving frontal and parietal cortex which are overall larger than  those on the prior study including a 3 cm infarct in the right precentral gyrus in the hand motor region which extends inferiorly into the centrum semiovale. There is no associated hemorrhage. A chronic right MCA infarct is again noted involving the insula, basal ganglia, and frontal lobe with hemosiderin staining and ex vacuo dilatation of the right frontal horn. An extra-axial left frontal mass anteriorly along the falx is unchanged, measuring 2.1 x 2.8 x 3.6 cm with local mass effect but no significant edema. There is no midline shift or extra-axial fluid collection. Mild generalized cerebral atrophy is not greater than expected for age. Vascular: Chronic right ICA occlusion. Skull and upper cervical spine: Unremarkable bone marrow signal. Sinuses/Orbits: Bilateral cataract extraction. Paranasal sinuses and mastoid air cells are clear. Other: None. IMPRESSION: 1.  Multiple new acute right MCA infarcts involving the frontal and parietal lobes. 2. Subacute and chronic infarcts as above. 3. Unchanged left para falcine meningioma. 4. Chronic occlusion of the right ICA. Electronically Signed   By: Logan Bores M.D.   On: 12/29/2018 17:00   Mr Brain Wo Contrast  Result Date: 12/23/2018 CLINICAL DATA:  Left arm weakness. EXAM: MRI HEAD WITHOUT CONTRAST MRA HEAD WITHOUT CONTRAST TECHNIQUE: Multiplanar, multiecho pulse sequences of the brain and surrounding structures were obtained without intravenous contrast. Angiographic images of the head were obtained using MRA technique without contrast. COMPARISON:  Head CT 12/23/2018.  Head MRI and MRA 05/03/2018. FINDINGS: MRI HEAD FINDINGS Brain: There are multiple small foci of restricted diffusion in the right MCA territory consistent with acute infarcts primarily involving cortex of the right frontal and parietal lobes. A chronic right MCA infarct is again noted involving the insula, basal ganglia, and frontal lobe with ex vacuo dilatation of the right frontal horn and hemosiderin staining. Scattered T2 hyperintensities elsewhere in the cerebral white matter bilaterally are similar to the prior MRI and nonspecific but compatible with mild chronic small vessel ischemic disease. An extra-axial mass anteriorly along the falx on the left has not significantly changed in size from the prior MRI and measures 2.2 x 2.8 x 3.7 cm. There is local mass effect on the left frontal lobe without edema. Mild generalized cerebral atrophy is not greater than expected for age. Vascular: Chronic right ICA occlusion. Skull and upper cervical spine: Unremarkable bone marrow signal. Sinuses/Orbits: Bilateral cataract extraction. Paranasal sinuses and mastoid air cells are clear. Other: None. MRA HEAD FINDINGS The visualized distal vertebral arteries are patent to the basilar with the left being dominant. The basilar artery is patent with minimal narrowing  distally. There is a large left posterior communicating artery. Severe bilateral P2 stenoses are again noted. There is chronic occlusion of the right ICA with distal reconstitution near the terminus. The right M1 and proximal M2 segments are poorly visualized, more so than on the prior MRA and suggesting severely reduced flow. There is some reconstitution of more robust flow related enhancement in the more distal right MCA branches. No significant flow related enhancement is apparent today in the right A1 segment which may reflect further reduced flow or interval occlusion. The right A2 segment is patent and supplied by the left ACA via the anterior communicating artery. The left intracranial ICA is widely patent. The left ACA and left MCA are patent without evidence of proximal branch occlusion or significant proximal stenosis. No aneurysm is identified. IMPRESSION: 1. Multiple small acute right MCA infarcts. 2. Chronic right MCA infarct. 3. 3.7 cm left anterior parafalcine meningioma, not significantly changed  in size from 2019 and without edema. 4. 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 Severely diminished flow in the proximal right MCA and possible interval occlusion of the right A1 segment. Electronically Signed   By: Logan Bores M.D.   On: 12/23/2018 14:57   Mr Cervical Spine W Wo Contrast  Result Date: 12/29/2018 CLINICAL DATA:  Left upper extremity weakness. EXAM: MRI CERVICAL SPINE WITHOUT AND WITH CONTRAST TECHNIQUE: Multiplanar and multiecho pulse sequences of the cervical spine, to include the craniocervical junction and cervicothoracic junction, were obtained without and with intravenous contrast. CONTRAST:  6 cc Gadavist COMPARISON:  Cervical spine CT scan from 02/05/2017 FINDINGS: Alignment: Severe degenerative cervical spondylosis with multilevel disc disease and facet disease. Multilevel degenerative subluxations are noted. Klippel-Feil anomaly noted at  C5-6. Vertebrae: No bone lesions or fractures. Cord: Normal MR appearance of the cervical spinal cord. No cord lesions or syrinx. No areas of abnormal cord enhancement. Posterior Fossa, vertebral arteries, paraspinal tissues: No significant findings. Disc levels: C2-3: No significant findings. C3-4: Degenerative disc disease and facet disease with a bulging annulus and osteophytic ridging. No significant spinal stenosis. Left-sided disc osteophyte complex with mild left foraminal stenosis. C4-5: Advanced degenerative disc disease with a bulging degenerated annulus, osteophytic ridging and uncinate spurring. There is flattening of the ventral thecal sac and narrowing the ventral CSF space. No significant foraminal stenosis. C5-6: Klippel-Feil anomaly with partial fusion. No spinal or foraminal stenosis. C6-7: Advanced degenerative disc disease with a bulging degenerated annulus, osteophytic ridging and uncinate spurring. There is flattening of the ventral thecal sac and near obliteration of the ventral CSF space. Mild foraminal narrowing bilaterally due to uncinate spurring. C7-T1: Bulging annulus and facet disease but no disc protrusions or spinal stenosis. Mild bilateral foraminal stenosis. IMPRESSION: 1. Advanced degenerative cervical spondylosis with multilevel disc disease and facet disease. 2. 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 3. Normal MR appearance of the cervical spinal cord. 4. Mild spinal and bilateral foraminal stenosis at C6-7. 5. Mild left foraminal stenosis at C3-4. 6. Mild bilateral foraminal stenosis at C7-T1. Electronically Signed   By: Marijo Sanes M.D.   On: 12/29/2018 16:51   US Carotid Bilateral (at [facility] Only)  Result Date: 12/23/2018 CLINICAL DATA:  Small acute right MCA territory infarcts. History of chronic right ICA occlusion EXAM: BILATERAL CAROTID DUPLEX ULTRASOUND TECHNIQUE: Pearline Cables scale imaging, color Doppler and duplex ultrasound were performed of bilateral carotid and vertebral  arteries in the neck. COMPARISON:  12/23/2018 MRI FINDINGS: Criteria: Quantification of carotid stenosis is based on velocity parameters that correlate the residual internal carotid diameter with NASCET-based stenosis levels, using the diameter of the distal internal carotid lumen as the denominator for stenosis measurement. The following velocity measurements were obtained: RIGHT ICA: Occluded cm/sec CCA: 33/4 cm/sec ECA: 283 cm/sec LEFT ICA: 174/44 cm/sec CCA: 61/95 cm/sec SYSTOLIC ICA/CCA RATIO:  3.7 ECA: 114 cm/sec RIGHT CAROTID ARTERY: Extensive right carotid atherosclerosis. CCA and ECA remain patent. Chronic occlusion of the right ICA as before. RIGHT VERTEBRAL ARTERY:  Antegrade LEFT CAROTID ARTERY: Moderate heterogeneous calcified atherosclerosis. Slight velocity elevation measuring 174/44 centimeters/second. Mild turbulent flow and spectral broadening. Left ICA stenosis estimated at 50-69% by ultrasound criteria. LEFT VERTEBRAL ARTERY:  Antegrade IMPRESSION: Chronically occluded right ICA as before. Moderate left ICA atherosclerosis. Left ICA stenosis estimated at 50-69% Patent antegrade vertebral flow bilaterally Electronically Signed   By: Jerilynn Mages.  Shick M.D.   On: 12/23/2018 15:28   Impression: I think she has  had another stroke.  We will plan to admit for PT OT speech and neurology consultation.  She had tele-neurology consultation last admission Principal Problem:   Carotid stenosis, bilateral Active Problems:   Stroke [facility])     Plan: As above.  Add Plavix      Alonza Bogus   12/30/2018, 8:01 AM

## 2018-12-30 NOTE — Progress Notes (Signed)
Patient ID: Danielle [name], female   DOB: August 02, 1931, 85 y.o.   MRN: [id number]                                                                PROGRESS NOTE                                                                                                                                                                                                             Patient Demographics:    Danielle [name], is a 85 y.o. female, DOB - 1931-12-20, YKZ:[id number]  Admit date - 12/29/2018   Admitting Physician Sinda Du, MD  Outpatient Primary MD for the patient is Sinda Du, MD  LOS - 1    CC: L sided weakness and ataxia     Brief Narrative     Danielle [name]  is a 85 y.o. female,  w hypothyroidism, hypertension, hyperlipidemia, Dm2, h/o meningioma L falx, w h/o  stroke 05/03/2018, and more recent admission 12/23/2018 for R MCA stroke with R carotid occlusion apparently now represents due to worsening left sided weakness and ataxia and found to have multiple acute R MCA infarcts involving the frontal and parietal lobes.  Pt was seen by Donalda Ewings who ordered CTA neck   Called by Radiology for abnormal CTA neck  IMPRESSION: 1. 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 - the Right ICA is thread-like distal to the severe stenosis, and occludes in the mid right siphon. - there is suboptimal reconstitution of the distal right ICA (see #2) and the right MCA and right ACA are patent but diminutive. - the left ICA maintains a more normal caliber distal to the severe stenosis. And the left anterior circulation is largely normal. 2. There is no vertebral or basilar artery stenosis, but the Right Pcomm is diminutive or absent. There is up to moderate right PCA stenosis. 3. Expected CT appearance of right MCA ischemia. No intracranial hemorrhage or mass effect. 4. Chronic left para falcine meningioma. 5. Advanced  cervical spine degeneration superimposed on congenital incomplete segmentation of C5-C6.  Per radiology it would be difficult for IR intervention, and more appropriate for vascular surgery to intervene.   Pt will be transferred to [facility] for evaluation by vascular surgery and neurology  Subjective:    Danielle [name] today has L sided weakness worse than previous.     Assessment  & Plan :    Principal Problem:   Carotid stenosis, bilateral Active Problems:   Stroke ([place])  Bilateral carotid stenosis NPO after 1am Vascular surgery consulted, appreciate input  Acute R MCA stroke (recurrent) Cont Asprin 325mg  po qday Cont Plavix 75mg  po qday Cont Lipitor 40mg  po qhs   Hypothyroidism Cont Synthroid 100 micrograms po qday  Hypertension Cont Amlodipine 5mg  po qday  Dm2 fsbs q4h, ISS   Code Status : FULL CODE  Family Communication  : attempted to call husband, not picking up, left message that patient will be transferred to [facility] for evaluation by vascular surgery  Disposition Plan  :  Transfer to [facility]  Barriers For Discharge :   Consults  :  Neurology, vascular surgery  Procedures  :  MRI brain 12/29/2018 MR Cervical spine 12/29/2018 CTA head/ neck 12/29/2018  DVT Prophylaxis  :   lovenox  Lab Results  Component Value Date   PLT 298 12/29/2018    Antibiotics  :  none  Anti-infectives (From admission, onward)   None        Objective:   Vitals:   12/29/18 1500 12/29/18 1800 12/29/18 2002 12/29/18 2211  BP: 133/60 (!) 161/60 (!) 150/50 (!) 161/59  Pulse: 71 78 80 74  Resp: 18 20 16 20   Temp: 98.3 F (36.8 C) 98 F (36.7 C) 97.9 F (36.6 C) 98.4 F (36.9 C)  TempSrc: Oral Oral Oral Oral  SpO2: 98% 98% 98% 97%  Weight:      Height:        Wt Readings from Last 3 Encounters:  12/29/18 60 kg  12/27/18 59.9 kg  12/23/18 59 kg     Intake/Output Summary (Last 24 hours) at 12/30/2018 0029 Last data filed at 12/29/2018 1700 Gross per 24 hour   Intake 240 ml  Output 550 ml  Net -310 ml     Physical Exam  Awake Alert, Oriented X 3,  Heent: anicteric, pupils 1.107mm symmetric, direct, consensual, near intact Neck : no jvd, + bilateral bruit Heart: rrr s1, s2, no m/g/r Lung: CTAB ABd: soft, nt, nd, +bs Ext: no c/c/e Neuro:   cn2-12 intact   Motor:  Left upper extremity is close to being plegic and graded as 1/5.  Left lower extremity shows a hip flexion of 4- and dorsiflexion 3/5.  There is a mild drift of the left lower extremity.  The right side shows normal tone, bulk and strength without any drift. Coordination: Left finger to nose is normal, right finger to nose is normal, No rest tremor; no intention tremor; no postural tremor; no bradykinesia. Reflexes : Deep tendon reflexes are symmetrical and normal.     Data Review:    CBC Recent Labs  Lab 12/23/18 1037 12/29/18 1541  WBC 6.5 6.0  HGB 12.1 11.9*  HCT 37.1 37.4  PLT 281 298  MCV 92.3 93.7  MCH 30.1 29.8  MCHC 32.6 31.8  RDW 14.5 13.9  LYMPHSABS 1.7 1.4  MONOABS 0.7 0.6  EOSABS 0.3 0.1  BASOSABS 0.1 0.1    Chemistries  Recent Labs  Lab 12/23/18 1037 12/29/18 1541  NA 137 134*  K 3.9 4.5  CL 110 100  CO2 21* 23  GLUCOSE 329* 595*  BUN 21 23  CREATININE 0.86 0.98  CALCIUM 8.5* 9.1  AST  --  51*  ALT  --  46*  ALKPHOS  --  117  BILITOT  --  0.8   ------------------------------------------------------------------------------------------------------------------ No results for input(s): CHOL, HDL, LDLCALC, TRIG, CHOLHDL, LDLDIRECT in the last 72 hours.  Lab Results  Component Value Date   HGBA1C 10.3 (H) 12/23/2018   ------------------------------------------------------------------------------------------------------------------ Recent Labs    12/29/18 1541  TSH 0.122*   ------------------------------------------------------------------------------------------------------------------ No results for input(s): VITAMINB12, FOLATE,  FERRITIN, TIBC, IRON, RETICCTPCT in the last 72 hours.  Coagulation profile No results for input(s): INR, PROTIME in the last 168 hours.  No results for input(s): DDIMER in the last 72 hours.  Cardiac Enzymes No results for input(s): CKMB, TROPONINI, MYOGLOBIN in the last 168 hours.  Invalid input(s): CK ------------------------------------------------------------------------------------------------------------------ No results found for: BNP  Inpatient Medications  Scheduled Meds:  amLODipine  5 mg Oral Daily   aspirin  325 mg Oral Daily   clopidogrel  75 mg Oral Daily   enoxaparin (LOVENOX) injection  40 mg Subcutaneous Q24H   insulin aspart  0-15 Units Subcutaneous TID WC   insulin aspart  0-5 Units Subcutaneous QHS   insulin detemir  20 Units Subcutaneous QHS   levothyroxine  100 mcg Oral q morning - 10a   multivitamin-lutein  1 capsule Oral Daily   sodium chloride flush  3 mL Intravenous Q12H   vitamin B-12  1,000 mcg Oral Daily   Continuous Infusions: PRN Meds:.acetaminophen **OR** acetaminophen  Micro Results Recent Results (from the past 240 hour(s))  SARS CORONAVIRUS 2 Nasal Swab Aptima Multi Swab     Status: None   Collection Time: 12/23/18  9:57 AM   Specimen: Aptima Multi Swab; Nasal Swab  Result Value Ref Range Status   SARS Coronavirus 2 NEGATIVE NEGATIVE Final    Comment: (NOTE) SARS-CoV-2 target nucleic acids are NOT DETECTED. The SARS-CoV-2 RNA is generally detectable in upper and lower respiratory specimens during the acute phase of infection. Negative results do not preclude SARS-CoV-2 infection, do not rule out co-infections with other pathogens, and should not be used as the sole basis for treatment or other patient management decisions. Negative results must be combined with clinical observations, patient history, and epidemiological information. The expected result is Negative. Fact Sheet for  Patients: SugarRoll.be Fact Sheet for Healthcare Providers: https://www.woods-mathews.com/ This test is not yet approved or cleared by the [place] FDA and  has been authorized for detection and/or diagnosis of SARS-CoV-2 by FDA under an Emergency Use Authorization (EUA). This EUA will remain  in effect (meaning this test can be used) for the duration of the COVID-19 declaration under Section 56 4(b)(1) of the Act, 21 U.S.C. section 360bbb-3(b)(1), unless the authorization is terminated or revoked sooner. Performed at [facility], [place] [address] [place], [place] [address]   SARS Coronavirus 2 [facility] order, Performed in [facility] hospital lab)     Status: None   Collection Time: 12/29/18  7:10 PM  Result Value Ref Range Status   SARS Coronavirus 2 NEGATIVE NEGATIVE Final    Comment: (NOTE) If result is NEGATIVE SARS-CoV-2 target nucleic acids are NOT DETECTED. The SARS-CoV-2 RNA is generally detectable in upper and lower  respiratory specimens during the acute phase of infection. The lowest  concentration of SARS-CoV-2 viral copies this assay can detect is 250  copies / mL. A negative result does not preclude SARS-CoV-2 infection  and should not be used as the sole basis for treatment or other  patient management decisions.  A negative result may occur with  improper specimen collection / handling,  submission of specimen other  than nasopharyngeal swab, presence of viral mutation(s) within the  areas targeted by this assay, and inadequate number of viral copies  (<250 copies / mL). A negative result must be combined with clinical  observations, patient history, and epidemiological information. If result is POSITIVE SARS-CoV-2 target nucleic acids are DETECTED. The SARS-CoV-2 RNA is generally detectable in upper and lower  respiratory specimens dur ing the acute phase of infection.  Positive  results are indicative of  active infection with SARS-CoV-2.  Clinical  correlation with patient history and other diagnostic information is  necessary to determine patient infection status.  Positive results do  not rule out bacterial infection or co-infection with other viruses. If result is PRESUMPTIVE POSTIVE SARS-CoV-2 nucleic acids MAY BE PRESENT.   A presumptive positive result was obtained on the submitted specimen  and confirmed on repeat testing.  While 2019 novel coronavirus  (SARS-CoV-2) nucleic acids may be present in the submitted sample  additional confirmatory testing may be necessary for epidemiological  and / or clinical management purposes  to differentiate between  SARS-CoV-2 and other Sarbecovirus currently known to infect humans.  If clinically indicated additional testing with an alternate test  methodology [id number]) is advised. The SARS-CoV-2 RNA is generally  detectable in upper and lower respiratory sp ecimens during the acute  phase of infection. The expected result is Negative. Fact Sheet for Patients:  StrictlyIdeas.no Fact Sheet for Healthcare Providers: BankingDealers.co.za This test is not yet approved or cleared by the [place] FDA and has been authorized for detection and/or diagnosis of SARS-CoV-2 by FDA under an Emergency Use Authorization (EUA).  This EUA will remain in effect (meaning this test can be used) for the duration of the COVID-19 declaration under Section 564(b)(1) of the Act, 21 U.S.C. section 360bbb-3(b)(1), unless the authorization is terminated or revoked sooner. Performed at [facility], [address] [place], [place] [address]     Radiology Reports Ct Angio Head W Or Wo Contrast  Addendum Date: 12/29/2018   ADDENDUM REPORT: 12/29/2018 23:52 ADDENDUM: Study discussed by telephone with Dr. Jani Gravel on 12/29/2018 at 2337 hours. Electronically Signed   By: Genevie Ann M.D.   On: 12/29/2018 23:52   Result  Date: 12/29/2018 CLINICAL DATA:  85 year old female with acute, subacute, and chronic right MCA territory infarcts. 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 EXAM: CT ANGIOGRAPHY HEAD AND NECK TECHNIQUE: Multidetector CT imaging of the head and neck was performed using the standard protocol during bolus administration of intravenous contrast. Multiplanar CT image reconstructions and MIPs were obtained to evaluate the vascular anatomy. Carotid stenosis measurements (when applicable) are obtained utilizing NASCET criteria, using the distal internal carotid diameter as the denominator. CONTRAST:  154mL OMNIPAQUE IOHEXOL 350 MG/ML SOLN COMPARISON:  Brain MRI 1305 hours today and earlier. Neck MRA 05/03/2018. FINDINGS: CT HEAD Brain: Patchy and confluent hypodensity in the right MCA territory corresponding to the various ischemia. No associated hemorrhage. No associated mass effect. No ventriculomegaly. Stable gray-white matter differentiation elsewhere since 12/23/2018. Left anterior paraspinal seen meningioma measuring about 2.7 centimeters diameter redemonstrated on series 5, image 9. Calvarium and skull base: No acute osseous abnormality identified. Paranasal sinuses: Visualized paranasal sinuses and mastoids are stable and well pneumatized. Orbits: No acute orbit or scalp soft tissue findings. CTA NECK Skeleton: Severe cervical spine degeneration superimposed on congenital appearing incomplete segmentation of C5-C6. Partially visible thoracic scoliosis and right shoulder arthroplasty. No acute osseous abnormality identified. Upper chest:  Negative. Other neck: Negative. Aortic arch: Mild to moderate arch atherosclerosis. Three vessel arch configuration. Right carotid system: No brachiocephalic or right CCA origin stenosis. Mild soft and calcified plaque in the right CCA proximal to the bifurcation. There is confluent calcification of the right ICA origin resulting in near occlusion of the vessel, string  sign type reconstitution occurs at the bulb (series 13, image 65), and the vessel is patent but highly diminutive and thread-like to the skull base. See series 9, image 65. Left carotid system: No left CCA origin stenosis and minimal plaque proximal to the left carotid bifurcation. The left ICA origin is also heavily calcified with string sign stenosis. The distal bulb is diminutive, only 2-3 millimeters diameter. However, the remaining cervical left ICA has a more normal caliber and is patent to the skull base without additional stenosis. Vertebral arteries: No proximal right subclavian artery stenosis despite plaque. The right vertebral artery is non dominant. Calcified plaque near the right vertebral origin results in no significant stenosis. The right vertebral is patent to the skull base without stenosis. No proximal left subclavian artery stenosis despite soft and calcified plaque. Dominant left vertebral artery with a normal origin. The left vertebral is patent to the skull base without stenosis. CTA HEAD Posterior circulation: Mildly dominant left V4 segment. No distal vertebral stenosis. Normal right PICA origin. The left AICA appears dominant and patent. Patent vertebrobasilar junction and basilar artery without stenosis. Patent AICA and SCA origins. Patent right PCA origin with mild irregularity but no significant stenosis. There is a fetal type left PCA. The right posterior communicating artery is diminutive or absent. Left PCA branches are within normal limits. There is additional moderate stenosis in the right PCA P2 segment, but the distal right PCA is within normal limits. Anterior circulation: Thread-like enhancement of the right ICA at the skull base is noted but the right siphon is occluded in the distal petrous segment. Enhancement is reconstituted at the distal cavernous segment and the supraclinoid right ICA is patent although with asymmetrically decreased enhancement. The right ICA terminus is  patent. The right A1 and M1 are patent but diminutive (series 15, image 16). The right MCA bifurcation is patent. The right MCA branches are patent but diminutive (series 14, image 19). The left ICA siphon is patent without stenosis despite some calcified plaque. Normal left posterior communicating artery origin. Normal left ICA terminus, left ACA origin. An anterior communicating artery is identified, although the left ACA remains larger than the right throughout. The ACA branches are within normal limits except for some mass effect related to the para falcine meningioma. The left MCA origin is normal with mild irregularity and stenosis in the left M1. The left MCA bifurcation is patent without stenosis. Left MCA branches are within normal limits. Venous sinuses: Patent. Anatomic variants: Dominant left vertebral artery. Review of the MIP images confirms the above findings IMPRESSION: 1. 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 - the Right ICA is thread-like distal to the severe stenosis, and occludes in the mid right siphon. - there is suboptimal reconstitution of the distal right ICA (see #2) and the right MCA and right ACA are patent but diminutive. - the left ICA maintains a more normal caliber distal to the severe stenosis. And the left anterior circulation is largely normal. 2. There is no vertebral or basilar artery stenosis, but the Right Pcomm is diminutive or absent. There is up to moderate right PCA stenosis. 3.  Expected CT appearance of right MCA ischemia. No intracranial hemorrhage or mass effect. 4. Chronic left para falcine meningioma. 5. 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 Electronically Signed: By: Genevie Ann M.D. On: 12/29/2018 23:29   Ct Head Wo Contrast  Result Date: 12/23/2018 CLINICAL DATA:  Left upper extremity weakness EXAM: CT HEAD WITHOUT CONTRAST TECHNIQUE:  Contiguous axial images were obtained from the base of the skull through the vertex without intravenous contrast. COMPARISON:  09/27/2018 FINDINGS: Brain: Redemonstration of a mass arising from the anterior falx on the left measuring approximately 2.6 x 2.8 cm, stable in size compared to prior. Internal focus of low-density likely internal cystic change, also stable from prior. Mass results in mild mass effect on the anterior horn of the left lateral ventricle. No appreciable surrounding edema. Encephalomalacia in the right frontal lobe from prior right MCA distribution infarct. Additional areas of encephalomalacia in the right basal ganglia from prior infarcts, also unchanged. No evidence of new infarct, hemorrhage, or extra-axial fluid collection. Scattered low-density changes within the periventricular and subcortical white matter compatible with chronic microvascular ischemic change. Ventricular size and configuration is unchanged. Vascular: Atherosclerotic calcifications involving the large vessels of the skull base. No unexpected hyperdense vessel. Skull: Normal. Negative for fracture or focal lesion. Sinuses/Orbits: No acute finding. Other: None. IMPRESSION: 1. No evidence of an acute intracranial abnormality. 2. Stable size of left anterior parafalcine meningioma. 3. Chronic microvascular ischemic changes as well as prior right MCA distribution infarct and prior right basal ganglia lacunar infarcts. Electronically Signed   By: Davina Poke M.D.   On: 12/23/2018 12:02   Ct Angio Neck W Or Wo Contrast  Addendum Date: 12/29/2018   ADDENDUM REPORT: 12/29/2018 23:52 ADDENDUM: Study discussed by telephone with Dr. Jani Gravel on 12/29/2018 at 2337 hours. Electronically Signed   By: Genevie Ann M.D.   On: 12/29/2018 23:52   Result Date: 12/29/2018 CLINICAL DATA:  85 year old female with acute, subacute, and chronic right MCA territory infarcts. 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 EXAM: CT  ANGIOGRAPHY HEAD AND NECK TECHNIQUE: Multidetector CT imaging of the head and neck was performed using the standard protocol during bolus administration of intravenous contrast. Multiplanar CT image reconstructions and MIPs were obtained to evaluate the vascular anatomy. Carotid stenosis measurements (when applicable) are obtained utilizing NASCET criteria, using the distal internal carotid diameter as the denominator. CONTRAST:  174mL OMNIPAQUE IOHEXOL 350 MG/ML SOLN COMPARISON:  Brain MRI 1305 hours today and earlier. Neck MRA 05/03/2018. FINDINGS: CT HEAD Brain: Patchy and confluent hypodensity in the right MCA territory corresponding to the various ischemia. No associated hemorrhage. No associated mass effect. No ventriculomegaly. Stable gray-white matter differentiation elsewhere since 12/23/2018. Left anterior paraspinal seen meningioma measuring about 2.7 centimeters diameter redemonstrated on series 5, image 9. Calvarium and skull base: No acute osseous abnormality identified. Paranasal sinuses: Visualized paranasal sinuses and mastoids are stable and well pneumatized. Orbits: No acute orbit or scalp soft tissue findings. CTA NECK Skeleton: Severe cervical spine degeneration superimposed on congenital appearing incomplete segmentation of C5-C6. Partially visible thoracic scoliosis and right shoulder arthroplasty. No acute osseous abnormality identified. Upper chest: Negative. Other neck: Negative. Aortic arch: Mild to moderate arch atherosclerosis. Three vessel arch configuration. Right carotid system: No brachiocephalic or right CCA origin stenosis. Mild soft and calcified plaque in the right CCA proximal to the bifurcation. There is confluent calcification of the right ICA origin resulting in near occlusion of the vessel,  string sign type reconstitution occurs at the bulb (series 13, image 65), and the vessel is patent but highly diminutive and thread-like to the skull base. See series 9, image 65. Left  carotid system: No left CCA origin stenosis and minimal plaque proximal to the left carotid bifurcation. The left ICA origin is also heavily calcified with string sign stenosis. The distal bulb is diminutive, only 2-3 millimeters diameter. However, the remaining cervical left ICA has a more normal caliber and is patent to the skull base without additional stenosis. Vertebral arteries: No proximal right subclavian artery stenosis despite plaque. The right vertebral artery is non dominant. Calcified plaque near the right vertebral origin results in no significant stenosis. The right vertebral is patent to the skull base without stenosis. No proximal left subclavian artery stenosis despite soft and calcified plaque. Dominant left vertebral artery with a normal origin. The left vertebral is patent to the skull base without stenosis. CTA HEAD Posterior circulation: Mildly dominant left V4 segment. No distal vertebral stenosis. Normal right PICA origin. The left AICA appears dominant and patent. Patent vertebrobasilar junction and basilar artery without stenosis. Patent AICA and SCA origins. Patent right PCA origin with mild irregularity but no significant stenosis. There is a fetal type left PCA. The right posterior communicating artery is diminutive or absent. Left PCA branches are within normal limits. There is additional moderate stenosis in the right PCA P2 segment, but the distal right PCA is within normal limits. Anterior circulation: Thread-like enhancement of the right ICA at the skull base is noted but the right siphon is occluded in the distal petrous segment. Enhancement is reconstituted at the distal cavernous segment and the supraclinoid right ICA is patent although with asymmetrically decreased enhancement. The right ICA terminus is patent. The right A1 and M1 are patent but diminutive (series 15, image 16). The right MCA bifurcation is patent. The right MCA branches are patent but diminutive (series 14,  image 19). The left ICA siphon is patent without stenosis despite some calcified plaque. Normal left posterior communicating artery origin. Normal left ICA terminus, left ACA origin. An anterior communicating artery is identified, although the left ACA remains larger than the right throughout. The ACA branches are within normal limits except for some mass effect related to the para falcine meningioma. The left MCA origin is normal with mild irregularity and stenosis in the left M1. The left MCA bifurcation is patent without stenosis. Left MCA branches are within normal limits. Venous sinuses: Patent. Anatomic variants: Dominant left vertebral artery. Review of the MIP images confirms the above findings IMPRESSION: 1. 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 - the Right ICA is thread-like distal to the severe stenosis, and occludes in the mid right siphon. - there is suboptimal reconstitution of the distal right ICA (see #2) and the right MCA and right ACA are patent but diminutive. - the left ICA maintains a more normal caliber distal to the severe stenosis. And the left anterior circulation is largely normal. 2. There is no vertebral or basilar artery stenosis, but the Right Pcomm is diminutive or absent. There is up to moderate right PCA stenosis. 3. Expected CT appearance of right MCA ischemia. No intracranial hemorrhage or mass effect. 4. Chronic left para falcine meningioma. 5. 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 Electronically Signed: By: Genevie Ann M.D. On: 12/29/2018 23:29   Mr Angio Head Wo Contrast  Result Date: 12/23/2018 CLINICAL DATA:  Left arm weakness. EXAM: MRI HEAD WITHOUT CONTRAST MRA HEAD WITHOUT CONTRAST TECHNIQUE: Multiplanar, multiecho pulse sequences of the brain and surrounding structures were obtained without intravenous contrast. Angiographic images of the head were  obtained using MRA technique without contrast. COMPARISON:  Head CT 12/23/2018.  Head MRI and MRA 05/03/2018. FINDINGS: MRI HEAD FINDINGS Brain: There are multiple small foci of restricted diffusion in the right MCA territory consistent with acute infarcts primarily involving cortex of the right frontal and parietal lobes. A chronic right MCA infarct is again noted involving the insula, basal ganglia, and frontal lobe with ex vacuo dilatation of the right frontal horn and hemosiderin staining. Scattered T2 hyperintensities elsewhere in the cerebral white matter bilaterally are similar to the prior MRI and nonspecific but compatible with mild chronic small vessel ischemic disease. An extra-axial mass anteriorly along the falx on the left has not significantly changed in size from the prior MRI and measures 2.2 x 2.8 x 3.7 cm. There is local mass effect on the left frontal lobe without edema. Mild generalized cerebral atrophy is not greater than expected for age. Vascular: Chronic right ICA occlusion. Skull and upper cervical spine: Unremarkable bone marrow signal. Sinuses/Orbits: Bilateral cataract extraction. Paranasal sinuses and mastoid air cells are clear. Other: None. MRA HEAD FINDINGS The visualized distal vertebral arteries are patent to the basilar with the left being dominant. The basilar artery is patent with minimal narrowing distally. There is a large left posterior communicating artery. Severe bilateral P2 stenoses are again noted. There is chronic occlusion of the right ICA with distal reconstitution near the terminus. The right M1 and proximal M2 segments are poorly visualized, more so than on the prior MRA and suggesting severely reduced flow. There is some reconstitution of more robust flow related enhancement in the more distal right MCA branches. No significant flow related enhancement is apparent today in the right A1 segment which may reflect further reduced flow or interval occlusion. The right  A2 segment is patent and supplied by the left ACA via the anterior communicating artery. The left intracranial ICA is widely patent. The left ACA and left MCA are patent without evidence of proximal branch occlusion or significant proximal stenosis. No aneurysm is identified. IMPRESSION: 1. Multiple small acute right MCA infarcts. 2. Chronic right MCA infarct. 3. 3.7 cm left anterior parafalcine meningioma, not significantly changed in size from 2019 and without edema. 4. 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 Severely diminished flow in the proximal right MCA and possible interval occlusion of the right A1 segment. Electronically Signed   By: Logan Bores M.D.   On: 12/23/2018 14:57   Mr Brain Wo Contrast  Result Date: 12/29/2018 CLINICAL DATA:  Leg weakness with difficulty walking. Recent stroke. EXAM: MRI HEAD WITHOUT CONTRAST TECHNIQUE: Multiplanar, multiecho pulse sequences of the brain and surrounding structures were obtained without intravenous contrast. COMPARISON:  12/23/2018 FINDINGS: Brain: Many of the small acute right MCA infarcts on the recent prior MRI demonstrate diminished diffusion signal abnormality. However, there are multiple new acute right MCA infarcts involving frontal and parietal cortex which are overall larger than those on the prior study including a 3 cm infarct in the right precentral gyrus in the hand motor region which extends inferiorly into the centrum semiovale. There is no associated hemorrhage. A chronic right MCA infarct is again noted involving the insula, basal ganglia, and frontal lobe with hemosiderin staining and ex vacuo dilatation of the right frontal horn. An  extra-axial left frontal mass anteriorly along the falx is unchanged, measuring 2.1 x 2.8 x 3.6 cm with local mass effect but no significant edema. There is no midline shift or extra-axial fluid collection. Mild generalized cerebral atrophy is not greater than expected for  age. Vascular: Chronic right ICA occlusion. Skull and upper cervical spine: Unremarkable bone marrow signal. Sinuses/Orbits: Bilateral cataract extraction. Paranasal sinuses and mastoid air cells are clear. Other: None. IMPRESSION: 1. Multiple new acute right MCA infarcts involving the frontal and parietal lobes. 2. Subacute and chronic infarcts as above. 3. Unchanged left para falcine meningioma. 4. Chronic occlusion of the right ICA. Electronically Signed   By: Logan Bores M.D.   On: 12/29/2018 17:00   Mr Brain Wo Contrast  Result Date: 12/23/2018 CLINICAL DATA:  Left arm weakness. EXAM: MRI HEAD WITHOUT CONTRAST MRA HEAD WITHOUT CONTRAST TECHNIQUE: Multiplanar, multiecho pulse sequences of the brain and surrounding structures were obtained without intravenous contrast. Angiographic images of the head were obtained using MRA technique without contrast. COMPARISON:  Head CT 12/23/2018.  Head MRI and MRA 05/03/2018. FINDINGS: MRI HEAD FINDINGS Brain: There are multiple small foci of restricted diffusion in the right MCA territory consistent with acute infarcts primarily involving cortex of the right frontal and parietal lobes. A chronic right MCA infarct is again noted involving the insula, basal ganglia, and frontal lobe with ex vacuo dilatation of the right frontal horn and hemosiderin staining. Scattered T2 hyperintensities elsewhere in the cerebral white matter bilaterally are similar to the prior MRI and nonspecific but compatible with mild chronic small vessel ischemic disease. An extra-axial mass anteriorly along the falx on the left has not significantly changed in size from the prior MRI and measures 2.2 x 2.8 x 3.7 cm. There is local mass effect on the left frontal lobe without edema. Mild generalized cerebral atrophy is not greater than expected for age. Vascular: Chronic right ICA occlusion. Skull and upper cervical spine: Unremarkable bone marrow signal. Sinuses/Orbits: Bilateral cataract  extraction. Paranasal sinuses and mastoid air cells are clear. Other: None. MRA HEAD FINDINGS The visualized distal vertebral arteries are patent to the basilar with the left being dominant. The basilar artery is patent with minimal narrowing distally. There is a large left posterior communicating artery. Severe bilateral P2 stenoses are again noted. There is chronic occlusion of the right ICA with distal reconstitution near the terminus. The right M1 and proximal M2 segments are poorly visualized, more so than on the prior MRA and suggesting severely reduced flow. There is some reconstitution of more robust flow related enhancement in the more distal right MCA branches. No significant flow related enhancement is apparent today in the right A1 segment which may reflect further reduced flow or interval occlusion. The right A2 segment is patent and supplied by the left ACA via the anterior communicating artery. The left intracranial ICA is widely patent. The left ACA and left MCA are patent without evidence of proximal branch occlusion or significant proximal stenosis. No aneurysm is identified. IMPRESSION: 1. Multiple small acute right MCA infarcts. 2. Chronic right MCA infarct. 3. 3.7 cm left anterior parafalcine meningioma, not significantly changed in size from 2019 and without edema. 4. 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 Severely diminished flow in the proximal right MCA and possible interval occlusion of the right A1 segment. Electronically Signed   By: Logan Bores M.D.   On: 12/23/2018 14:57   Mr Cervical Spine W Wo Contrast  Result  Date: 12/29/2018 CLINICAL DATA:  Left upper extremity weakness. EXAM: MRI CERVICAL SPINE WITHOUT AND WITH CONTRAST TECHNIQUE: Multiplanar and multiecho pulse sequences of the cervical spine, to include the craniocervical junction and cervicothoracic junction, were obtained without and with intravenous contrast. CONTRAST:  6 cc Gadavist  COMPARISON:  Cervical spine CT scan from 02/05/2017 FINDINGS: Alignment: Severe degenerative cervical spondylosis with multilevel disc disease and facet disease. Multilevel degenerative subluxations are noted. 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 Vertebrae: No bone lesions or fractures. Cord: Normal MR appearance of the cervical spinal cord. No cord lesions or syrinx. No areas of abnormal cord enhancement. Posterior Fossa, vertebral arteries, paraspinal tissues: No significant findings. Disc levels: C2-3: No significant findings. C3-4: Degenerative disc disease and facet disease with a bulging annulus and osteophytic ridging. No significant spinal stenosis. Left-sided disc osteophyte complex with mild left foraminal stenosis. C4-5: Advanced degenerative disc disease with a bulging degenerated annulus, osteophytic ridging and uncinate spurring. There is flattening of the ventral thecal sac and narrowing the ventral CSF space. No significant foraminal stenosis. C5-6: Klippel-Feil anomaly with partial fusion. No spinal or foraminal stenosis. C6-7: Advanced degenerative disc disease with a bulging degenerated annulus, osteophytic ridging and uncinate spurring. There is flattening of the ventral thecal sac and near obliteration of the ventral CSF space. Mild foraminal narrowing bilaterally due to uncinate spurring. C7-T1: Bulging annulus and facet disease but no disc protrusions or spinal stenosis. Mild bilateral foraminal stenosis. IMPRESSION: 1. Advanced degenerative cervical spondylosis with multilevel disc disease and facet disease. 2. 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 3. Normal MR appearance of the cervical spinal cord. 4. Mild spinal and bilateral foraminal stenosis at C6-7. 5. Mild left foraminal stenosis at C3-4. 6. Mild bilateral foraminal stenosis at C7-T1. Electronically Signed   By: Marijo Sanes M.D.   On: 12/29/2018 16:51   US Carotid Bilateral (at [facility] Only)  Result Date: 12/23/2018 CLINICAL DATA:   Small acute right MCA territory infarcts. History of chronic right ICA occlusion EXAM: BILATERAL CAROTID DUPLEX ULTRASOUND TECHNIQUE: Pearline Cables scale imaging, color Doppler and duplex ultrasound were performed of bilateral carotid and vertebral arteries in the neck. COMPARISON:  12/23/2018 MRI FINDINGS: Criteria: Quantification of carotid stenosis is based on velocity parameters that correlate the residual internal carotid diameter with NASCET-based stenosis levels, using the diameter of the distal internal carotid lumen as the denominator for stenosis measurement. The following velocity measurements were obtained: RIGHT ICA: Occluded cm/sec CCA: 33/4 cm/sec ECA: 283 cm/sec LEFT ICA: 174/44 cm/sec CCA: 24/26 cm/sec SYSTOLIC ICA/CCA RATIO:  3.7 ECA: 114 cm/sec RIGHT CAROTID ARTERY: Extensive right carotid atherosclerosis. CCA and ECA remain patent. Chronic occlusion of the right ICA as before. RIGHT VERTEBRAL ARTERY:  Antegrade LEFT CAROTID ARTERY: Moderate heterogeneous calcified atherosclerosis. Slight velocity elevation measuring 174/44 centimeters/second. Mild turbulent flow and spectral broadening. Left ICA stenosis estimated at 50-69% by ultrasound criteria. LEFT VERTEBRAL ARTERY:  Antegrade IMPRESSION: Chronically occluded right ICA as before. Moderate left ICA atherosclerosis. Left ICA stenosis estimated at 50-69% Patent antegrade vertebral flow bilaterally Electronically Signed   By: Jerilynn Mages.  Shick M.D.   On: 12/23/2018 15:28    Time Spent in minutes  30   Jani Gravel M.D on 12/30/2018 at 12:29 AM  Between 7pm - 7am  Pager:  [phone]    After 7pm go to [email or web address] - password [facility]  [facility] -  Office  [phone]

## 2018-12-30 NOTE — Progress Notes (Signed)
STROKE TEAM PROGRESS NOTE   INTERVAL HISTORY Pt husband at bedside. Pt lying in bed, still has left arm plegia. AAO x 3. Vascular surgery on board. CUS showed right ICA string sign and left ICA high grade stenosis  Vitals:   12/30/18 0000 12/30/18 0143 12/30/18 0303 12/30/18 0734  BP: (!) 161/73 (!) 143/57 (!) 160/65 140/62  Pulse: 84 78 77 66  Resp: 19 20 16 13   Temp: 98 F (36.7 C) 98.2 F (36.8 C) 97.9 F (36.6 C) 98.1 F (36.7 C)  TempSrc: Oral Oral Oral Oral  SpO2: 97% 98% 97% 97%  Weight:   60.1 kg   Height:   5\' 5"  (1.651 m)     CBC:  Recent Labs  Lab 12/23/18 1037 12/29/18 1541  WBC 6.5 6.0  NEUTROABS 3.7 3.8  HGB 12.1 11.9*  HCT 37.1 37.4  MCV 92.3 93.7  PLT 281 315    Basic Metabolic Panel:  Recent Labs  Lab 12/23/18 1037 12/29/18 1541  NA 137 134*  K 3.9 4.5  CL 110 100  CO2 21* 23  GLUCOSE 329* 595*  BUN 21 23  CREATININE 0.86 0.98  CALCIUM 8.5* 9.1   Lipid Panel:     Component Value Date/Time   CHOL 171 12/24/2018 0443   TRIG 75 12/24/2018 0443   HDL 59 12/24/2018 0443   CHOLHDL 2.9 12/24/2018 0443   VLDL 15 12/24/2018 0443   LDLCALC 97 12/24/2018 0443   HgbA1c:  Lab Results  Component Value Date   HGBA1C 10.3 (H) 12/23/2018   Urine Drug Screen: No results found for: LABOPIA, COCAINSCRNUR, LABBENZ, AMPHETMU, THCU, LABBARB  Alcohol Level No results found for: [facility]  IMAGING Ct Angio Head W Or Wo Contrast  Addendum Date: 12/29/2018   ADDENDUM REPORT: 12/29/2018 23:52 ADDENDUM: Study discussed by telephone with Dr. Jani Gravel on 12/29/2018 at 2337 hours. Electronically Signed   By: Genevie Ann M.D.   On: 12/29/2018 23:52   Result Date: 12/29/2018 CLINICAL DATA:  85 year old female with acute, subacute, and chronic right MCA territory infarcts. 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 EXAM: CT ANGIOGRAPHY HEAD AND NECK TECHNIQUE: Multidetector CT imaging of the head and neck was performed using the standard protocol during bolus  administration of intravenous contrast. Multiplanar CT image reconstructions and MIPs were obtained to evaluate the vascular anatomy. Carotid stenosis measurements (when applicable) are obtained utilizing NASCET criteria, using the distal internal carotid diameter as the denominator. CONTRAST:  1104mL OMNIPAQUE IOHEXOL 350 MG/ML SOLN COMPARISON:  Brain MRI 1305 hours today and earlier. Neck MRA 05/03/2018. FINDINGS: CT HEAD Brain: Patchy and confluent hypodensity in the right MCA territory corresponding to the various ischemia. No associated hemorrhage. No associated mass effect. No ventriculomegaly. Stable gray-white matter differentiation elsewhere since 12/23/2018. Left anterior paraspinal seen meningioma measuring about 2.7 centimeters diameter redemonstrated on series 5, image 9. Calvarium and skull base: No acute osseous abnormality identified. Paranasal sinuses: Visualized paranasal sinuses and mastoids are stable and well pneumatized. Orbits: No acute orbit or scalp soft tissue findings. CTA NECK Skeleton: Severe cervical spine degeneration superimposed on congenital appearing incomplete segmentation of C5-C6. Partially visible thoracic scoliosis and right shoulder arthroplasty. No acute osseous abnormality identified. Upper chest: Negative. Other neck: Negative. Aortic arch: Mild to moderate arch atherosclerosis. Three vessel arch configuration. Right carotid system: No brachiocephalic or right CCA origin stenosis. Mild soft and calcified plaque in the right CCA proximal to the bifurcation. There is confluent calcification of the right  ICA origin resulting in near occlusion of the vessel, string sign type reconstitution occurs at the bulb (series 13, image 65), and the vessel is patent but highly diminutive and thread-like to the skull base. See series 9, image 65. Left carotid system: No left CCA origin stenosis and minimal plaque proximal to the left carotid bifurcation. The left ICA origin is also heavily  calcified with string sign stenosis. The distal bulb is diminutive, only 2-3 millimeters diameter. However, the remaining cervical left ICA has a more normal caliber and is patent to the skull base without additional stenosis. Vertebral arteries: No proximal right subclavian artery stenosis despite plaque. The right vertebral artery is non dominant. Calcified plaque near the right vertebral origin results in no significant stenosis. The right vertebral is patent to the skull base without stenosis. No proximal left subclavian artery stenosis despite soft and calcified plaque. Dominant left vertebral artery with a normal origin. The left vertebral is patent to the skull base without stenosis. CTA HEAD Posterior circulation: Mildly dominant left V4 segment. No distal vertebral stenosis. Normal right PICA origin. The left AICA appears dominant and patent. Patent vertebrobasilar junction and basilar artery without stenosis. Patent AICA and SCA origins. Patent right PCA origin with mild irregularity but no significant stenosis. There is a fetal type left PCA. The right posterior communicating artery is diminutive or absent. Left PCA branches are within normal limits. There is additional moderate stenosis in the right PCA P2 segment, but the distal right PCA is within normal limits. Anterior circulation: Thread-like enhancement of the right ICA at the skull base is noted but the right siphon is occluded in the distal petrous segment. Enhancement is reconstituted at the distal cavernous segment and the supraclinoid right ICA is patent although with asymmetrically decreased enhancement. The right ICA terminus is patent. The right A1 and M1 are patent but diminutive (series 15, image 16). The right MCA bifurcation is patent. The right MCA branches are patent but diminutive (series 14, image 19). The left ICA siphon is patent without stenosis despite some calcified plaque. Normal left posterior communicating artery origin.  Normal left ICA terminus, left ACA origin. An anterior communicating artery is identified, although the left ACA remains larger than the right throughout. The ACA branches are within normal limits except for some mass effect related to the para falcine meningioma. The left MCA origin is normal with mild irregularity and stenosis in the left M1. The left MCA bifurcation is patent without stenosis. Left MCA branches are within normal limits. Venous sinuses: Patent. Anatomic variants: Dominant left vertebral artery. Review of the MIP images confirms the above findings IMPRESSION: 1. 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 - the Right ICA is thread-like distal to the severe stenosis, and occludes in the mid right siphon. - there is suboptimal reconstitution of the distal right ICA (see #2) and the right MCA and right ACA are patent but diminutive. - the left ICA maintains a more normal caliber distal to the severe stenosis. And the left anterior circulation is largely normal. 2. There is no vertebral or basilar artery stenosis, but the Right Pcomm is diminutive or absent. There is up to moderate right PCA stenosis. 3. Expected CT appearance of right MCA ischemia. No intracranial hemorrhage or mass effect. 4. Chronic left para falcine meningioma. 5. 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 Electronically Signed: By: Genevie Ann M.D. On: 12/29/2018 23:29   Ct Angio  Neck W Or Wo Contrast  Addendum Date: 12/29/2018   ADDENDUM REPORT: 12/29/2018 23:52 ADDENDUM: Study discussed by telephone with Dr. Jani Gravel on 12/29/2018 at 2337 hours. Electronically Signed   By: Genevie Ann M.D.   On: 12/29/2018 23:52   Result Date: 12/29/2018 CLINICAL DATA:  85 year old female with acute, subacute, and chronic right MCA territory infarcts. 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 EXAM: CT ANGIOGRAPHY HEAD AND  NECK TECHNIQUE: Multidetector CT imaging of the head and neck was performed using the standard protocol during bolus administration of intravenous contrast. Multiplanar CT image reconstructions and MIPs were obtained to evaluate the vascular anatomy. Carotid stenosis measurements (when applicable) are obtained utilizing NASCET criteria, using the distal internal carotid diameter as the denominator. CONTRAST:  159mL OMNIPAQUE IOHEXOL 350 MG/ML SOLN COMPARISON:  Brain MRI 1305 hours today and earlier. Neck MRA 05/03/2018. FINDINGS: CT HEAD Brain: Patchy and confluent hypodensity in the right MCA territory corresponding to the various ischemia. No associated hemorrhage. No associated mass effect. No ventriculomegaly. Stable gray-white matter differentiation elsewhere since 12/23/2018. Left anterior paraspinal seen meningioma measuring about 2.7 centimeters diameter redemonstrated on series 5, image 9. Calvarium and skull base: No acute osseous abnormality identified. Paranasal sinuses: Visualized paranasal sinuses and mastoids are stable and well pneumatized. Orbits: No acute orbit or scalp soft tissue findings. CTA NECK Skeleton: Severe cervical spine degeneration superimposed on congenital appearing incomplete segmentation of C5-C6. Partially visible thoracic scoliosis and right shoulder arthroplasty. No acute osseous abnormality identified. Upper chest: Negative. Other neck: Negative. Aortic arch: Mild to moderate arch atherosclerosis. Three vessel arch configuration. Right carotid system: No brachiocephalic or right CCA origin stenosis. Mild soft and calcified plaque in the right CCA proximal to the bifurcation. There is confluent calcification of the right ICA origin resulting in near occlusion of the vessel, string sign type reconstitution occurs at the bulb (series 13, image 65), and the vessel is patent but highly diminutive and thread-like to the skull base. See series 9, image 65. Left carotid system: No left  CCA origin stenosis and minimal plaque proximal to the left carotid bifurcation. The left ICA origin is also heavily calcified with string sign stenosis. The distal bulb is diminutive, only 2-3 millimeters diameter. However, the remaining cervical left ICA has a more normal caliber and is patent to the skull base without additional stenosis. Vertebral arteries: No proximal right subclavian artery stenosis despite plaque. The right vertebral artery is non dominant. Calcified plaque near the right vertebral origin results in no significant stenosis. The right vertebral is patent to the skull base without stenosis. No proximal left subclavian artery stenosis despite soft and calcified plaque. Dominant left vertebral artery with a normal origin. The left vertebral is patent to the skull base without stenosis. CTA HEAD Posterior circulation: Mildly dominant left V4 segment. No distal vertebral stenosis. Normal right PICA origin. The left AICA appears dominant and patent. Patent vertebrobasilar junction and basilar artery without stenosis. Patent AICA and SCA origins. Patent right PCA origin with mild irregularity but no significant stenosis. There is a fetal type left PCA. The right posterior communicating artery is diminutive or absent. Left PCA branches are within normal limits. There is additional moderate stenosis in the right PCA P2 segment, but the distal right PCA is within normal limits. Anterior circulation: Thread-like enhancement of the right ICA at the skull base is noted but the right siphon is occluded in the distal petrous segment. Enhancement is reconstituted at the distal cavernous  segment and the supraclinoid right ICA is patent although with asymmetrically decreased enhancement. The right ICA terminus is patent. The right A1 and M1 are patent but diminutive (series 15, image 16). The right MCA bifurcation is patent. The right MCA branches are patent but diminutive (series 14, image 19). The left ICA  siphon is patent without stenosis despite some calcified plaque. Normal left posterior communicating artery origin. Normal left ICA terminus, left ACA origin. An anterior communicating artery is identified, although the left ACA remains larger than the right throughout. The ACA branches are within normal limits except for some mass effect related to the para falcine meningioma. The left MCA origin is normal with mild irregularity and stenosis in the left M1. The left MCA bifurcation is patent without stenosis. Left MCA branches are within normal limits. Venous sinuses: Patent. Anatomic variants: Dominant left vertebral artery. Review of the MIP images confirms the above findings IMPRESSION: 1. 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 - the Right ICA is thread-like distal to the severe stenosis, and occludes in the mid right siphon. - there is suboptimal reconstitution of the distal right ICA (see #2) and the right MCA and right ACA are patent but diminutive. - the left ICA maintains a more normal caliber distal to the severe stenosis. And the left anterior circulation is largely normal. 2. There is no vertebral or basilar artery stenosis, but the Right Pcomm is diminutive or absent. There is up to moderate right PCA stenosis. 3. Expected CT appearance of right MCA ischemia. No intracranial hemorrhage or mass effect. 4. Chronic left para falcine meningioma. 5. 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 Electronically Signed: By: Genevie Ann M.D. On: 12/29/2018 23:29   Mr Brain Wo Contrast  Result Date: 12/29/2018 CLINICAL DATA:  Leg weakness with difficulty walking. Recent stroke. EXAM: MRI HEAD WITHOUT CONTRAST TECHNIQUE: Multiplanar, multiecho pulse sequences of the brain and surrounding structures were obtained without intravenous contrast. COMPARISON:  12/23/2018 FINDINGS: Brain: Many of the  small acute right MCA infarcts on the recent prior MRI demonstrate diminished diffusion signal abnormality. However, there are multiple new acute right MCA infarcts involving frontal and parietal cortex which are overall larger than those on the prior study including a 3 cm infarct in the right precentral gyrus in the hand motor region which extends inferiorly into the centrum semiovale. There is no associated hemorrhage. A chronic right MCA infarct is again noted involving the insula, basal ganglia, and frontal lobe with hemosiderin staining and ex vacuo dilatation of the right frontal horn. An extra-axial left frontal mass anteriorly along the falx is unchanged, measuring 2.1 x 2.8 x 3.6 cm with local mass effect but no significant edema. There is no midline shift or extra-axial fluid collection. Mild generalized cerebral atrophy is not greater than expected for age. Vascular: Chronic right ICA occlusion. Skull and upper cervical spine: Unremarkable bone marrow signal. Sinuses/Orbits: Bilateral cataract extraction. Paranasal sinuses and mastoid air cells are clear. Other: None. IMPRESSION: 1. Multiple new acute right MCA infarcts involving the frontal and parietal lobes. 2. Subacute and chronic infarcts as above. 3. Unchanged left para falcine meningioma. 4. Chronic occlusion of the right ICA. Electronically Signed   By: Logan Bores M.D.   On: 12/29/2018 17:00   Mr Cervical Spine W Wo Contrast  Result Date: 12/29/2018 CLINICAL DATA:  Left upper extremity weakness. EXAM: MRI CERVICAL SPINE WITHOUT AND WITH CONTRAST TECHNIQUE: Multiplanar and multiecho  pulse sequences of the cervical spine, to include the craniocervical junction and cervicothoracic junction, were obtained without and with intravenous contrast. CONTRAST:  6 cc Gadavist COMPARISON:  Cervical spine CT scan from 02/05/2017 FINDINGS: Alignment: Severe degenerative cervical spondylosis with multilevel disc disease and facet disease. Multilevel  degenerative subluxations are noted. 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 Vertebrae: No bone lesions or fractures. Cord: Normal MR appearance of the cervical spinal cord. No cord lesions or syrinx. No areas of abnormal cord enhancement. Posterior Fossa, vertebral arteries, paraspinal tissues: No significant findings. Disc levels: C2-3: No significant findings. C3-4: Degenerative disc disease and facet disease with a bulging annulus and osteophytic ridging. No significant spinal stenosis. Left-sided disc osteophyte complex with mild left foraminal stenosis. C4-5: Advanced degenerative disc disease with a bulging degenerated annulus, osteophytic ridging and uncinate spurring. There is flattening of the ventral thecal sac and narrowing the ventral CSF space. No significant foraminal stenosis. C5-6: Klippel-Feil anomaly with partial fusion. No spinal or foraminal stenosis. C6-7: Advanced degenerative disc disease with a bulging degenerated annulus, osteophytic ridging and uncinate spurring. There is flattening of the ventral thecal sac and near obliteration of the ventral CSF space. Mild foraminal narrowing bilaterally due to uncinate spurring. C7-T1: Bulging annulus and facet disease but no disc protrusions or spinal stenosis. Mild bilateral foraminal stenosis. IMPRESSION: 1. Advanced degenerative cervical spondylosis with multilevel disc disease and facet disease. 2. 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 3. Normal MR appearance of the cervical spinal cord. 4. Mild spinal and bilateral foraminal stenosis at C6-7. 5. Mild left foraminal stenosis at C3-4. 6. Mild bilateral foraminal stenosis at C7-T1. Electronically Signed   By: Marijo Sanes M.D.   On: 12/29/2018 16:51   Vas US Carotid  Result Date: 12/30/2018 Carotid Arterial Duplex Study Indications:       Bilateral ICA stenosis / string sign seen on CTA neck. Risk Factors:      Hypertension, hyperlipidemia, Diabetes. Comparison Study:  Carotid artery duplex 12/23/2018, CTA  head and neck 12/29/2018. Performing Technologist: Maudry Mayhew MHA, RDMS, RVT, RDCS  Examination Guidelines: A complete evaluation includes B-mode imaging, spectral Doppler, color Doppler, and power Doppler as needed of all accessible portions of each vessel. Bilateral testing is considered an integral part of a complete examination. Limited examinations for reoccurring indications may be performed as noted.  Right Carotid Findings: +----------+--------+-------+--------+------------------------+----------------+           PSV cm/sEDV    StenosisDescribe                Comments                           cm/s                                                    +----------+--------+-------+--------+------------------------+----------------+ CCA Prox  28                     smooth and heterogenous                  +----------+--------+-------+--------+------------------------+----------------+ CCA Distal30      4              heterogenous and  calcific                                 +----------+--------+-------+--------+------------------------+----------------+ ICA Prox  130     14             calcific                String sign      +----------+--------+-------+--------+------------------------+----------------+ ICA Distal14                                             Thumped waveform +----------+--------+-------+--------+------------------------+----------------+ ECA       380     24                                                      +----------+--------+-------+--------+------------------------+----------------+ +----------+--------+-------+----------------+-------------------+           PSV cm/sEDV cmsDescribe        Arm Pressure (mmHG) +----------+--------+-------+----------------+-------------------+ [id number]             Multiphasic, WNL                     +----------+--------+-------+----------------+-------------------+ +---------+--------+--+--------+--+---------+ VertebralPSV cm/s85EDV cm/s11Antegrade +---------+--------+--+--------+--+---------+  Left Carotid Findings: +----------+--------+--------+--------+-----------------------+--------+           PSV cm/sEDV cm/sStenosisDescribe               Comments +----------+--------+--------+--------+-----------------------+--------+ CCA Prox  73      13                                              +----------+--------+--------+--------+-----------------------+--------+ CCA Distal69      12              smooth and heterogenous         +----------+--------+--------+--------+-----------------------+--------+ ICA Prox  263     56              calcific                        +----------+--------+--------+--------+-----------------------+--------+ ICA Distal145     29                                              +----------+--------+--------+--------+-----------------------+--------+ ECA       172                     calcific                        +----------+--------+--------+--------+-----------------------+--------+ +----------+--------+--------+----------------+-------------------+ SubclavianPSV cm/sEDV cm/sDescribe        Arm Pressure (mmHG) +----------+--------+--------+----------------+-------------------+           171             Multiphasic, WNL                    +----------+--------+--------+----------------+-------------------+ +---------+--------+--+--------+-+---------+ VertebralPSV cm/s47EDV  cm/s9Antegrade +---------+--------+--+--------+-+---------+  Summary: Right Carotid: Sonographic string sign noted at origin of right internal carotid                artery, with thumped waveform of distal ICA, suggestive of distal                occlusion. This is consistent with CTA head and neck findings. Left Carotid: Elevated peak systolic  velocities are suggestive of 60-79%               stenosis, however end diastolic velocities suggest upper range               40-59% stenosis. However, posterior acoustic shadowing secondary               to calcific plaque at bulb may obscure higher velocities. Vertebrals:  Bilateral vertebral arteries demonstrate antegrade flow. Subclavians: Normal flow hemodynamics were seen in bilateral subclavian              arteries. *See table(s) above for measurements and observations.     Preliminary     PHYSICAL EXAM  Temp:  [97.9 F (36.6 C)-98.6 F (37 C)] 98.1 F (36.7 C) (08/13 0734) Pulse Rate:  [66-84] 66 (08/13 0734) Resp:  [13-20] 13 (08/13 0734) BP: (133-161)/(50-73) 140/62 (08/13 0734) SpO2:  [97 %-98 %] 97 % (08/13 0734) Weight:  [60 kg-60.1 kg] 60.1 kg (08/13 0303)  General - Well nourished, well developed, in no apparent distress.  Ophthalmologic - fundi not visualized due to noncooperation.  Cardiovascular - Regular rate and rhythm.  Mental Status -  Level of arousal and orientation to time, place, and person were intact. Language including expression, naming, repetition, comprehension was assessed and found intact. Fund of Knowledge was assessed and was intact.  Cranial Nerves II - XII - II - Visual field intact OU. III, IV, VI - Extraocular movements intact. V - Facial sensation intact bilaterally. VII - slight left nasolabial fold flattening. VIII - Hearing & vestibular intact bilaterally. X - Palate elevates symmetrically. XI - Chin turning & shoulder shrug intact bilaterally. XII - Tongue protrusion intact.  Motor Strength - The patient's strength was normal in RUE and RLE, however, LUE 0/5 and LLE 5-/5.  Bulk was normal and fasciculations were absent.   Motor Tone - Muscle tone was assessed at the neck and appendages and was normal.  Reflexes - The patient's reflexes were symmetrical in all extremities and she had no pathological reflexes.  Sensory - Light  touch, temperature/pinprick were assessed and were symmetrical.    Coordination - The patient had normal movements in the right FTN with no ataxia or dysmetria.  Tremor was absent.  Gait and Station - deferred.   ASSESSMENT/PLAN [name] is a 85 y.o. female with history of hypothyroidism, hypertension, hyperlipidemia, DM2, stroke in 05/03/2018 and more recent admission 12/23/2018 for R MCA stroke with R carotid occlusion presenting to [facility] ED with worsening L sided weakness and ataxia found to have mult acute R MCA infarcts. CTA showed B ICA string signs. Transferred to [facility].   Stroke:   Recurrent R MCA infarcts in setting of R ICA occlusion and severe L ICA stenosis  MRI Mult new R MCA infarcts in frontal and parietal lobe. Subacute and chronic R MCA infarcts. Unchanged L para falcine meningioma. chronic R ICA occlusion.   CTA head & neck B string signs B ICAs. R PComm diminuative/absent. Mod R PCA stenosis. Chronic L para  falcine meningioma.  Carotid Doppler  R ICA string sign w/ distal occlusion. L 69-62% with end diastolic velocities suggestions 40-59%. B VA antegrade.  2D Echo 8/7 EF 60-65%. No source of embolus   LDL 97  HgbA1c 10.3  Lovenox 40 mg sq daily for VTE prophylaxis  aspirin 325 mg daily prior to admission, now on aspirin 325 mg daily and clopidogrel 75 mg daily.   Therapy recommendations:  pending   Disposition:  pending   History of Stroke  12/23/2018 - L arm weakness. Tele neuro consulted in ED. MRI showed small R MCA acute and chronic infarcts. MRA head right MCA decrease flow. EEG neg. CUS showed R ICA occluded, L ICA 50-69%. EF 60-65%, A1C 10.3 and LDL 97. Still with weakness at d/c, hp improving.  refused SNF. D/c home on ASA 325, lipitor and fish oil.  04/2018 - left arm weakness, confusion, and difficulty walking - R frontal and parietal lobe infarcts in setting of R ICA occlusion. MRA head and neck right ICA occlusion, left ICA 50%  stenosis and b/l P2 severe stenosis. CUS right ICA occlusion and left 50-69% stenosis. EF 65-70%. A1C 7.9 and LDL 163. D/c on aspirin and plavix. Statin added. ([place] [facility])  Carotid Stenosis, bilateral  R ICA occluded vs. String sign  L ICA 40-59% stenosis  VVS consulted and will follow up this afternoon  Recommend cerebral angio to further evaluation before intervention  Consider R or L ICA intervention based on cerebral angio  Hypertension  Stable . Keep BP 130-160 given severe B ICA stenosis  Hyperlipidemia  Home meds:  lipitor 40 and fish oil  Now on lipitor 80  LDL 97, goal < 70  Continue statin at discharge  Diabetes type II Uncontrolled  HgbA1c 10.3, goal < 7.0  CBGs  SSI  Close PCP follow up for better DM control  Other Stroke Risk Factors  Advanced age  Former Cigarette smoker, quit 29 rs ago  Other Active Problems  hypothryoidism   Hospital day # 1  I spent  35 minutes in total face-to-face time with the patient, more than 50% of which was spent in counseling and coordination of care, reviewing test results, images and medication, and discussing the diagnosis of right brain stroke, b/l carotid stenosis, recurrent stroke, DM, HLD, HTN treatment plan and potential prognosis. This patient's care requiresreview of multiple databases, neurological assessment, discussion with family, other specialists and medical decision making of high complexity. I had long discussion with pt and husband at bedside, updated pt current condition, treatment plan and potential prognosis. They expressed understanding and appreciation.   Rosalin Hawking, MD PhD Stroke Neurology 12/30/2018 11:56 AM  To contact Stroke Continuity provider, please refer to [email or web address] After hours, contact General Neurology

## 2018-12-30 NOTE — Progress Notes (Signed)
I await updated PT and OT evals since transfer from [facility] , then I will meet with pt and spouse to discuss rehab venue options.  Danne Baxter, RN, MSN Rehab Admissions Coordinator [phone] 12/30/2018 12:23 PM

## 2018-12-30 NOTE — Progress Notes (Signed)
Physical Therapy Treatment Patient Details Name: Danielle [name] MRN: [id number] DOB: 1932/02/26 Today's Date: 12/30/2018    History of Present Illness Patient is a 85 y/o female with history of CVA presented with left UE weakness to the ED. MRI was completed on 12/23/18 showing multiple small acute right MCA infarcts. She was discharged from hospital to home on 12/27/18. had difficulties getting out of car after discharge and slid to ground, son and father had to help get her up. Per husband, they went to the ER, she was seen and sent home. They were home yesterday and husband had increased difficulty transfering patient. MD called to follow up wtih patient and wanted her admitted to hospital.    PT Comments    Pt making progress towards physical therapy goals. Requiring mod assist for mobility. Ambulating x 15 feet with use of right railing and close chair follow. Displays left sided neglect, upper extremity hemiparesis, left lower extremity weakness, balance impairments, gait abnormalities, and decreased awareness. Remains excellent candidate for CIR in order to promote neuro recovery.     Follow Up Recommendations  CIR;Supervision/Assistance - 24 hour     Equipment Recommendations  Other (comment)(quad cane)    Recommendations for Other Services       Precautions / Restrictions Precautions Precautions: Fall Precaution Comments: LUE neglect, hemiparesis Restrictions Weight Bearing Restrictions: No    Mobility  Bed Mobility Overal bed mobility: Needs Assistance Bed Mobility: Supine to Sit     Supine to sit: Mod assist     General bed mobility comments: Able to move RLE off of bed and LLE over some, use of RUE on bed rail, modA for trunk elevation and to scoot hips forward to edge of bed with bed pad  Transfers Overall transfer level: Needs assistance Equipment used: 1 person hand held assist Transfers: Sit to/from [facility] Sit to Stand: Mod  assist Stand pivot transfers: Mod assist       General transfer comment: ModA for standing up x 4, modA for stand pivot from bed > chair and chair <> toilet. Pt requires left knee block and right handheld assist, tendency to reach for external support with RUE. requires cues for direction/sequencing   Ambulation/Gait Ambulation/Gait assistance: Min assist Gait Distance (Feet): 15 Feet Assistive device: (R railing) Gait Pattern/deviations: Step-to pattern;Decreased weight shift to left;Trunk flexed Gait velocity: decreased Gait velocity interpretation: <1.8 ft/sec, indicate of risk for recurrent falls General Gait Details: Use of right rail, cues for sequencing, upright posture (mild right lateral lean). Noted left knee hyperextension in mid stance, hemiparetic gait pattern, close chair follow utilized   Marine scientist Rankin (Stroke Patients Only) Modified Rankin (Stroke Patients Only) Pre-Morbid Rankin Score: Moderate disability Modified Rankin: Moderately severe disability     Balance Overall balance assessment: Needs assistance Sitting-balance support: Feet supported;Single extremity supported Sitting balance-Leahy Scale: Fair Sitting balance - Comments: Right lateral lean, requires tactile and verbal cues to correct   Standing balance support: Single extremity supported;During functional activity Standing balance-Leahy Scale: Fair                              Cognition Arousal/Alertness: Awake/alert Behavior During Therapy: WFL for tasks assessed/performed Overall Cognitive Status: Impaired/Different from baseline Area of Impairment: Awareness;Safety/judgement  Safety/Judgement: Decreased awareness of safety;Decreased awareness of deficits Awareness: Emergent   General Comments: A&Ox4, decreased safety awareness, asking husband to assist her back to bed after education provided  by PT for transfers by STAFF only      Exercises      General Comments        Pertinent Vitals/Pain Pain Assessment: Faces Faces Pain Scale: Hurts a little bit Pain Location: left shoulder Pain Descriptors / Indicators: Guarding;Grimacing;Sore Pain Intervention(s): Monitored during session;Repositioned    Home Living                      Prior Function            PT Goals (current goals can now be found in the care plan section) Acute Rehab PT Goals Patient Stated Goal: to get stronger so she can return home Potential to Achieve Goals: Good Progress towards PT goals: Progressing toward goals    Frequency    Min 4X/week      PT Plan Frequency needs to be updated    Co-evaluation              AM-PAC PT "6 Clicks" Mobility   Outcome Measure  Help needed turning from your back to your side while in a flat bed without using bedrails?: A Little Help needed moving from lying on your back to sitting on the side of a flat bed without using bedrails?: A Lot Help needed moving to and from a bed to a chair (including a wheelchair)?: A Lot Help needed standing up from a chair using your arms (e.g., wheelchair or bedside chair)?: A Lot Help needed to walk in hospital room?: A Lot Help needed climbing 3-5 steps with a railing? : Total 6 Click Score: 12    End of Session Equipment Utilized During Treatment: Gait belt Activity Tolerance: Patient tolerated treatment well Patient left: in chair;with call bell/phone within reach;with chair alarm set;with family/visitor present Nurse Communication: Mobility status PT Visit Diagnosis: Unsteadiness on feet (R26.81);Other abnormalities of gait and mobility (R26.89);Muscle weakness (generalized) (M62.81)     Time: 4782-9562 PT Time Calculation (min) (ACUTE ONLY): 32 min  Charges:  $Gait Training: 8-22 mins $Therapeutic Activity: 8-22 mins                     Ellamae Sia, PT, DPT Acute Rehabilitation  Services Pager [phone] Office [phone]    Willy Eddy 12/30/2018, 4:38 PM

## 2018-12-30 NOTE — Consult Note (Addendum)
Hospital Consult    Reason for Consult:  R CVA, carotid stenosis Requesting Physician:  Dr. Maudie Mercury MRN #:  [id number]  History of Present Illness: This is a 85 y.o. female with past medical history significant for hypothyroidism, hypertension, hyperlipidemia, and uncontrolled diabetes.  She is being seen in evaluation for symptomatic carotid artery stenosis.  She has a history of right-sided CVA in December 2019 and most recently right-sided CVA last week.  Carotid duplex at the time of her prior stroke indicated a occlusion of her right ICA.  Repeat carotid duplex again demonstrates a total occlusion of right ICA.  CTA of neck however demonstrates bilateral string signs of ICA origin.  Patient is a poor historian and there is no family member present during the time of my examination.  She has however oriented to person and place.  She still reports some weakness in her left leg.  She is unable to move her left arm or hand.  At the time of stroke she had been on aspirin and statin.  Plavix was added by neurology during current hospitalization.  Patient states she lives with her mother and had been fully ambulatory prior to most recent CVA.Marland Kitchen  Past Medical History:  Diagnosis Date  . Arthritis    "some joint pain once in awhile" (05/26/2017)  . CVA (cerebral vascular accident) ([place]) 2019  . History of kidney stones   . Hyperlipemia   . Hypertension   . Hypothyroidism (acquired)   . Pneumothorax 02/05/2017   right-after fall  . Rib fractures 02/05/2017   right side  . Stroke ([place])   . Type II diabetes mellitus ([place])     Past Surgical History:  Procedure Laterality Date  . AUGMENTATION MAMMAPLASTY Bilateral   . DILATION AND CURETTAGE OF UTERUS    . FRACTURE SURGERY    . REVERSE SHOULDER ARTHROPLASTY Right 05/26/2017  . REVERSE SHOULDER ARTHROPLASTY Right 05/26/2017   Procedure: REVERSE RIGHT SHOULDER ARTHROPLASTY;  Surgeon: Meredith Pel, MD;  Location: [facility];  Service: Orthopedics;   Laterality: Right;  . TONSILLECTOMY    . TUBAL LIGATION      Allergies  Allergen Reactions  . Fosamax [Alendronate Sodium] Other (See Comments)    MUSCLE ACHES  . Prednisone     Feels jittery and nauseous    Prior to Admission medications   Medication Sig Start Date End Date Taking? Authorizing Provider  amLODipine (NORVASC) 5 MG tablet Take 1 tablet (5 mg total) by mouth daily. 12/27/18 12/27/19  Sinda Du, MD  aspirin 325 MG tablet Take 1 tablet (325 mg total) by mouth daily. 12/28/18   Sinda Du, MD  atorvastatin (LIPITOR) 40 MG tablet Take 1 tablet (40 mg total) by mouth daily at 6 PM. 05/04/18   Patrecia Pour, MD  Calcium Carb-Cholecalciferol (CALCIUM 600 + D PO) Take 1 tablet by mouth daily.    [provider]  guaiFENesin (MUCINEX) 600 MG 12 hr tablet Take 2 tablets (1,200 mg total) by mouth 2 (two) times daily. 12/27/18   Sinda Du, MD  insulin lispro (HUMALOG) 100 UNIT/ML injection Inject 12 Units into the skin once.     [provider]  Multiple Vitamin (MULTIVITAMIN WITH MINERALS) TABS Take 1 tablet by mouth daily.     [provider]  multivitamin-lutein (OCUVITE-LUTEIN) CAPS capsule Take 1 capsule by mouth daily.    [provider]  Omega-3 Fatty Acids (FISH OIL) 1000 MG CAPS Take 1,000 mg by mouth daily.  [provider]  SYNTHROID 100 MCG tablet Take 100 mcg by mouth every morning.  08/21/18   [provider]  TOUJEO SOLOSTAR 300 UNIT/ML SOPN Inject 20-22 Units into the skin every morning.  09/07/18   [provider]  vitamin B-12 (CYANOCOBALAMIN) 1000 MCG tablet Take 1,000 mcg by mouth daily.    [provider]    Social History   Socioeconomic History  . Marital status: Married    Spouse name: Not on file  . Number of children: Not on file  . Years of education: Not on file  . Highest education level: Not on file  Occupational History  . Not on file  Social Needs  . Financial  resource strain: Patient refused  . Food insecurity    Worry: Patient refused    Inability: Patient refused  . Transportation needs    Medical: Patient refused    Non-medical: Patient refused  Tobacco Use  . Smoking status: Former Smoker    Packs/day: 1.00    Years: 40.00    Pack years: 40.00    Types: Cigarettes    Quit date: 1991    Years since quitting: 29.6  . Smokeless tobacco: Never Used  Substance and Sexual Activity  . Alcohol use: No  . Drug use: No  . Sexual activity: Not on file  Lifestyle  . Physical activity    Days per week: Patient refused    Minutes per session: Patient refused  . Stress: Patient refused  Relationships  . Social Herbalist on phone: Patient refused    Gets together: Patient refused    Attends religious service: Patient refused    Active member of club or organization: Patient refused    Attends meetings of clubs or organizations: Patient refused    Relationship status: Patient refused  . Intimate partner violence    Fear of current or ex partner: Patient refused    Emotionally abused: Patient refused    Physically abused: Patient refused    Forced sexual activity: Patient refused  Other Topics Concern  . Not on file  Social History Narrative  . Not on file     History reviewed. No pertinent family history.  ROS: Otherwise negative unless mentioned in HPI  Physical Examination  Vitals:   12/30/18 0303 12/30/18 0734  BP: (!) 160/65 140/62  Pulse: 77 66  Resp: 16 13  Temp: 97.9 F (36.6 C) 98.1 F (36.7 C)  SpO2: 97% 97%   Body mass index is 22.05 kg/m.  General:  WDWN in NAD Gait: Not observed HENT: WNL, normocephalic Pulmonary: normal non-labored breathing Cardiac: regular Abdomen: soft, NT/ND, no masses Skin: without rashes Vascular Exam/Pulses: Symmetrical radial pulses; feet symmetrically warm to touch with no tissue loss Extremities: without ischemic changes, without Gangrene , without cellulitis;  without open wounds;  Musculoskeletal: no muscle wasting or atrophy  Neurologic: A&O but somnolent; weakness left lower extremity; unable to move left arm or hand Lymph:  Unremarkable  CBC    Component Value Date/Time   WBC 6.0 12/29/2018 1541   RBC 3.99 12/29/2018 1541   HGB 11.9 (L) 12/29/2018 1541   HCT 37.4 12/29/2018 1541   PLT 298 12/29/2018 1541   MCV 93.7 12/29/2018 1541   MCH 29.8 12/29/2018 1541   MCHC 31.8 12/29/2018 1541   RDW 13.9 12/29/2018 1541   LYMPHSABS 1.4 12/29/2018 1541   MONOABS 0.6 12/29/2018 1541   EOSABS 0.1 12/29/2018 1541   BASOSABS 0.1 12/29/2018  1541    BMET    Component Value Date/Time   NA 134 (L) 12/29/2018 1541   K 4.5 12/29/2018 1541   CL 100 12/29/2018 1541   CO2 23 12/29/2018 1541   GLUCOSE 595 (HH) 12/29/2018 1541   BUN 23 12/29/2018 1541   CREATININE 0.98 12/29/2018 1541   CALCIUM 9.1 12/29/2018 1541   GFRNONAA 53 (L) 12/29/2018 1541   GFRAA >60 12/29/2018 1541    COAGS: Lab Results  Component Value Date   INR 0.87 05/03/2018     Non-Invasive Vascular Imaging:   Carotid duplex demonstrates left ICA 50 to 69% stenosis, and chronic occlusion of right ICA consistent with carotid duplex from 04/2018  CTA neck demonstrating bilateral string sign at ICA origin   ASSESSMENT/PLAN: This is a 85 y.o. female with right-sided CVA; bilateral string sign of ICA origin noted on CTA  -Despite reading of total occlusion of right ICA on carotid duplex patient has had progression of right-sided CVA -CTA demonstrating bilateral ICA string sign suggesting a symptomatic right carotid stenosis -Agree with addition of Plavix -We will continue to follow patient's clinical picture -On-call vascular surgeon Dr. Carlis Abbott will evaluate the patient later today and discuss utility of revascularization of right ICA   Dagoberto Ligas PA-C [facility] [phone]  I have seen and evaluated the patient. I agree with the PA note as  documented above.  85 year old female admitted with acute on chronic right-sided infarcts on MRI.  She has profound weakness in her left arm and 4-5 strength in her left leg.  Husband reports that she had a right carotid occlusion documented at [facility] since last year.  I have reviewed her CTA here and it appears the right ICA is patent but with a radiographic string sign and very diminutive distal to bifurcation.  However her right ICA is occluded in the carotid siphon.  I obtained a carotid duplex here that confirms the right ICA is patent but with a thump signal consistent with distal occlusion as noted in the carotid siphon on CTA.  I discussed with patient and her husband that I do not think she would benefit from carotid endarterectomy given distal ICA occlusion in the carotid siphon.  I think the risk would exceed the benefits in that scenario and not sure that it would be really helpful in reducing her stroke risk long-term.  On the left I think she has a moderate stenosis based on velocity criteria and after review of CTA.  This is an asymptoamtic moderate stenosis.  I do not think she has a string sign on the left.  Discussed that I would let her recover from her stroke and we can follow her as an outpatient and decide when to intervene on her left carotid.  Would recommend medical management for now with dual antiplatelet therapy.  Marty Heck, MD [facility] Office: [phone] Pager: [place]

## 2018-12-30 NOTE — Progress Notes (Signed)
Pt leaving unit with [facility] transport at this time. Pt aware and accepting of transfer.

## 2018-12-30 NOTE — Progress Notes (Addendum)
Stat carotid artery duplex completed. Refer to "CV Proc" under chart review to view preliminary results.  Critical results discussed with Dr. Carlis Abbott.  12/30/2018 9:13 AM Maudry Mayhew, MHA, RVT, RDCS, RDMS

## 2018-12-30 NOTE — Progress Notes (Signed)
PROGRESS NOTE    BONA HUBBARD  WCH:[id number] DOB: 1931/05/21 DOA: 12/29/2018 PCP: Sinda Du, MD   Brief Narrative:  85 year old female with history of hypertension, hyperlipidemia, diabetes mellitus, hypothyroidism, meningioma of left falx, CVA 05/03/2018, 12/23/2018 for right MCA stroke with a right carotid occlusion presented to primary care physician's office with the worsening of the left-sided weakness, ataxia.  CT of the neck showed positive for bilateral radiographic string sign stenosis of both ICA origins due to bulky calcification.  The right MCA is threadlike distal to the severe stenosis and occludes in the mid right siphon.  There is a suboptimal reconstitution of the distal right ICA and the right MCA and right ACA are patent but diminutive.  The left ICA maintains in normal caliber distal to the severe stenosis.  No vertebral artery or basilar artery stenosis.  MRI of the brain showed multiple new acute right MCA infarcts involving the frontal and parietal lobes.  Subacute and chronic infarcts of the insula, basal ganglia and frontal lobe with the hemosiderin staining and ex vacuo dilatation of the right frontal horn.  Neurology is consulted, recommended cerebral angiogram to further evaluate.  Continue with aspirin, Lipitor, Plavix.  Assessment & Plan:  ##CVA with the extension of the strokes from 12/23/2018 -Patient has worsening of the weakness in the left arm and left lower extremity. -Continue with aspirin, Plavix, statin -MRI of the brain showed multiple new acute right MCA infarcts, CT angiogram shows bilateral string sign with severe stenosis -Neurology stroke team is following with the patient, plan to do angiogram of the carotids on 12/30/2018 and possible intervention -Continue with physical therapy, Occupational Therapy, speech therapy evaluation  Bilateral carotid artery stenosis -Plan for carotid angiogram by interventional radiology, possible  intervention  Hypertension -Permissible hypertension  Hyperlipidemia -Continue with the Lipitor  Diabetes mellitus controlled -Last hemoglobin A1c was 10.3 -Keep the patient on Lantus 10 units daily -Follow-up on sliding scale insulin  Hypothyroidism -Continue with the Synthroid     Principal Problem:   Carotid stenosis, bilateral Active Problems:   Stroke ([place])     DVT prophylaxis: (Lovenox/Heparin/SCD's/anticoagulated/None (if comfort care) Code Status: (Full/Partial - specify details) Family Communication: (Specify name, relationship & date discussed. NO "discussed with patient") Disposition Plan: (specify when and where you expect patient to be discharged). Include barriers to DC in this tab.   Consultants:   Neurology  Procedures:   Antimicrobials: None  Subjective: Complaining of weakness on the left side.  Wants to  eat  Objective: Vitals:   12/30/18 0000 12/30/18 0143 12/30/18 0303 12/30/18 0734  BP: (!) 161/73 (!) 143/57 (!) 160/65 140/62  Pulse: 84 78 77 66  Resp: 19 20 16 13   Temp: 98 F (36.7 C) 98.2 F (36.8 C) 97.9 F (36.6 C) 98.1 F (36.7 C)  TempSrc: Oral Oral Oral Oral  SpO2: 97% 98% 97% 97%  Weight:   60.1 kg   Height:   5\' 5"  (3.536 m)     Intake/Output Summary (Last 24 hours) at 12/30/2018 1228 Last data filed at 12/29/2018 1700 Gross per 24 hour  Intake 240 ml  Output 550 ml  Net -310 ml   Filed Weights   12/29/18 1204 12/30/18 0303  Weight: 60 kg 60.1 kg    Examination:  General exam: Appears calm and comfortable  Respiratory system: Clear to auscultation. Respiratory effort normal. Cardiovascular system: S1 & S2 heard, RRR. No JVD, murmurs, rubs, gallops or clicks. No pedal edema. Gastrointestinal system: Abdomen is  nondistended, soft and nontender. No organomegaly or masses felt. Normal bowel sounds heard. Central nervous system: Alert and oriented.  Left upper extremity 0/5, left lower extremity 1/5, right upper  and lower extremities 5/5.  Mild dysarthria Skin: No rashes, lesions or ulcers Psychiatry: Judgement and insight appear normal. Mood & affect appropriate.     Data Reviewed: I have personally reviewed following labs and imaging studies  CBC: Recent Labs  Lab 12/29/18 1541  WBC 6.0  NEUTROABS 3.8  HGB 11.9*  HCT 37.4  MCV 93.7  PLT 229   Basic Metabolic Panel: Recent Labs  Lab 12/29/18 1541  NA 134*  K 4.5  CL 100  CO2 23  GLUCOSE 595*  BUN 23  CREATININE 0.98  CALCIUM 9.1   GFR: Estimated Creatinine Clearance: 38.5 mL/min (by C-G formula based on SCr of 0.98 mg/dL). Liver Function Tests: Recent Labs  Lab 12/29/18 1541  AST 51*  ALT 46*  ALKPHOS 117  BILITOT 0.8  PROT 6.4*  ALBUMIN 3.4*   No results for input(s): LIPASE, AMYLASE in the last 168 hours. No results for input(s): AMMONIA in the last 168 hours. Coagulation Profile: No results for input(s): INR, PROTIME in the last 168 hours. Cardiac Enzymes: No results for input(s): CKTOTAL, CKMB, CKMBINDEX, TROPONINI in the last 168 hours. BNP (last 3 results) No results for input(s): PROBNP in the last 8760 hours. HbA1C: No results for input(s): HGBA1C in the last 72 hours. CBG: Recent Labs  Lab 12/27/18 0813 12/29/18 2221 12/30/18 0119 12/30/18 0500 12/30/18 0737  GLUCAP 274* 199* 199* 170* 91   Lipid Profile: No results for input(s): CHOL, HDL, LDLCALC, TRIG, CHOLHDL, LDLDIRECT in the last 72 hours. Thyroid Function Tests: Recent Labs    12/29/18 1541  TSH 0.122*   Anemia Panel: No results for input(s): VITAMINB12, FOLATE, FERRITIN, TIBC, IRON, RETICCTPCT in the last 72 hours. Sepsis Labs: No results for input(s): PROCALCITON, LATICACIDVEN in the last 168 hours.  Recent Results (from the past 240 hour(s))  SARS CORONAVIRUS 2 Nasal Swab Aptima Multi Swab     Status: None   Collection Time: 12/23/18  9:57 AM   Specimen: Aptima Multi Swab; Nasal Swab  Result Value Ref Range Status   SARS  Coronavirus 2 NEGATIVE NEGATIVE Final    Comment: (NOTE) SARS-CoV-2 target nucleic acids are NOT DETECTED. The SARS-CoV-2 RNA is generally detectable in upper and lower respiratory specimens during the acute phase of infection. Negative results do not preclude SARS-CoV-2 infection, do not rule out co-infections with other pathogens, and should not be used as the sole basis for treatment or other patient management decisions. Negative results must be combined with clinical observations, patient history, and epidemiological information. The expected result is Negative. Fact Sheet for Patients: SugarRoll.be Fact Sheet for Healthcare Providers: https://www.woods-mathews.com/ This test is not yet approved or cleared by the [place] FDA and  has been authorized for detection and/or diagnosis of SARS-CoV-2 by FDA under an Emergency Use Authorization (EUA). This EUA will remain  in effect (meaning this test can be used) for the duration of the COVID-19 declaration under Section 56 4(b)(1) of the Act, 21 U.S.C. section 360bbb-3(b)(1), unless the authorization is terminated or revoked sooner. Performed at [facility], [place] [address], [place], [place] [address]   SARS Coronavirus 2 [facility] order, Performed in [facility] hospital lab)     Status: None   Collection Time: 12/29/18  7:10 PM  Result Value Ref Range Status   SARS  Coronavirus 2 NEGATIVE NEGATIVE Final    Comment: (NOTE) If result is NEGATIVE SARS-CoV-2 target nucleic acids are NOT DETECTED. The SARS-CoV-2 RNA is generally detectable in upper and lower  respiratory specimens during the acute phase of infection. The lowest  concentration of SARS-CoV-2 viral copies this assay can detect is 250  copies / mL. A negative result does not preclude SARS-CoV-2 infection  and should not be used as the sole basis for treatment or other  patient management decisions.  A negative result  may occur with  improper specimen collection / handling, submission of specimen other  than nasopharyngeal swab, presence of viral mutation(s) within the  areas targeted by this assay, and inadequate number of viral copies  (<250 copies / mL). A negative result must be combined with clinical  observations, patient history, and epidemiological information. If result is POSITIVE SARS-CoV-2 target nucleic acids are DETECTED. The SARS-CoV-2 RNA is generally detectable in upper and lower  respiratory specimens dur ing the acute phase of infection.  Positive  results are indicative of active infection with SARS-CoV-2.  Clinical  correlation with patient history and other diagnostic information is  necessary to determine patient infection status.  Positive results do  not rule out bacterial infection or co-infection with other viruses. If result is PRESUMPTIVE POSTIVE SARS-CoV-2 nucleic acids MAY BE PRESENT.   A presumptive positive result was obtained on the submitted specimen  and confirmed on repeat testing.  While 2019 novel coronavirus  (SARS-CoV-2) nucleic acids may be present in the submitted sample  additional confirmatory testing may be necessary for epidemiological  and / or clinical management purposes  to differentiate between  SARS-CoV-2 and other Sarbecovirus currently known to infect humans.  If clinically indicated additional testing with an alternate test  methodology [phone]) is advised. The SARS-CoV-2 RNA is generally  detectable in upper and lower respiratory sp ecimens during the acute  phase of infection. The expected result is Negative. Fact Sheet for Patients:  StrictlyIdeas.no Fact Sheet for Healthcare Providers: BankingDealers.co.za This test is not yet approved or cleared by the [place] FDA and has been authorized for detection and/or diagnosis of SARS-CoV-2 by FDA under an Emergency Use Authorization (EUA).   This EUA will remain in effect (meaning this test can be used) for the duration of the COVID-19 declaration under Section 564(b)(1) of the Act, 21 U.S.C. section 360bbb-3(b)(1), unless the authorization is terminated or revoked sooner. Performed at [facility], [address], [place], [place] [address]          Radiology Studies: Ct Angio Head W Or Wo Contrast  Addendum Date: 12/29/2018   ADDENDUM REPORT: 12/29/2018 23:52 ADDENDUM: Study discussed by telephone with Dr. Jani Gravel on 12/29/2018 at 2337 hours. Electronically Signed   By: Genevie Ann M.D.   On: 12/29/2018 23:52   Result Date: 12/29/2018 CLINICAL DATA:  85 year old female with acute, subacute, and chronic right MCA territory infarcts. 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 EXAM: CT ANGIOGRAPHY HEAD AND NECK TECHNIQUE: Multidetector CT imaging of the head and neck was performed using the standard protocol during bolus administration of intravenous contrast. Multiplanar CT image reconstructions and MIPs were obtained to evaluate the vascular anatomy. Carotid stenosis measurements (when applicable) are obtained utilizing NASCET criteria, using the distal internal carotid diameter as the denominator. CONTRAST:  168mL OMNIPAQUE IOHEXOL 350 MG/ML SOLN COMPARISON:  Brain MRI 1305 hours today and earlier. Neck MRA 05/03/2018. FINDINGS: CT HEAD Brain: Patchy and confluent hypodensity  in the right MCA territory corresponding to the various ischemia. No associated hemorrhage. No associated mass effect. No ventriculomegaly. Stable gray-white matter differentiation elsewhere since 12/23/2018. Left anterior paraspinal seen meningioma measuring about 2.7 centimeters diameter redemonstrated on series 5, image 9. Calvarium and skull base: No acute osseous abnormality identified. Paranasal sinuses: Visualized paranasal sinuses and mastoids are stable and well pneumatized. Orbits: No acute orbit or scalp soft tissue findings. CTA NECK Skeleton:  Severe cervical spine degeneration superimposed on congenital appearing incomplete segmentation of C5-C6. Partially visible thoracic scoliosis and right shoulder arthroplasty. No acute osseous abnormality identified. Upper chest: Negative. Other neck: Negative. Aortic arch: Mild to moderate arch atherosclerosis. Three vessel arch configuration. Right carotid system: No brachiocephalic or right CCA origin stenosis. Mild soft and calcified plaque in the right CCA proximal to the bifurcation. There is confluent calcification of the right ICA origin resulting in near occlusion of the vessel, string sign type reconstitution occurs at the bulb (series 13, image 65), and the vessel is patent but highly diminutive and thread-like to the skull base. See series 9, image 65. Left carotid system: No left CCA origin stenosis and minimal plaque proximal to the left carotid bifurcation. The left ICA origin is also heavily calcified with string sign stenosis. The distal bulb is diminutive, only 2-3 millimeters diameter. However, the remaining cervical left ICA has a more normal caliber and is patent to the skull base without additional stenosis. Vertebral arteries: No proximal right subclavian artery stenosis despite plaque. The right vertebral artery is non dominant. Calcified plaque near the right vertebral origin results in no significant stenosis. The right vertebral is patent to the skull base without stenosis. No proximal left subclavian artery stenosis despite soft and calcified plaque. Dominant left vertebral artery with a normal origin. The left vertebral is patent to the skull base without stenosis. CTA HEAD Posterior circulation: Mildly dominant left V4 segment. No distal vertebral stenosis. Normal right PICA origin. The left AICA appears dominant and patent. Patent vertebrobasilar junction and basilar artery without stenosis. Patent AICA and SCA origins. Patent right PCA origin with mild irregularity but no significant  stenosis. There is a fetal type left PCA. The right posterior communicating artery is diminutive or absent. Left PCA branches are within normal limits. There is additional moderate stenosis in the right PCA P2 segment, but the distal right PCA is within normal limits. Anterior circulation: Thread-like enhancement of the right ICA at the skull base is noted but the right siphon is occluded in the distal petrous segment. Enhancement is reconstituted at the distal cavernous segment and the supraclinoid right ICA is patent although with asymmetrically decreased enhancement. The right ICA terminus is patent. The right A1 and M1 are patent but diminutive (series 15, image 16). The right MCA bifurcation is patent. The right MCA branches are patent but diminutive (series 14, image 19). The left ICA siphon is patent without stenosis despite some calcified plaque. Normal left posterior communicating artery origin. Normal left ICA terminus, left ACA origin. An anterior communicating artery is identified, although the left ACA remains larger than the right throughout. The ACA branches are within normal limits except for some mass effect related to the para falcine meningioma. The left MCA origin is normal with mild irregularity and stenosis in the left M1. The left MCA bifurcation is patent without stenosis. Left MCA branches are within normal limits. Venous sinuses: Patent. Anatomic variants: Dominant left vertebral artery. Review of the MIP images confirms the above findings IMPRESSION: 1.  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 - the Right ICA is thread-like distal to the severe stenosis, and occludes in the mid right siphon. - there is suboptimal reconstitution of the distal right ICA (see #2) and the right MCA and right ACA are patent but diminutive. - the left ICA maintains a more normal caliber distal to the severe stenosis. And the left anterior  circulation is largely normal. 2. There is no vertebral or basilar artery stenosis, but the Right Pcomm is diminutive or absent. There is up to moderate right PCA stenosis. 3. Expected CT appearance of right MCA ischemia. No intracranial hemorrhage or mass effect. 4. Chronic left para falcine meningioma. 5. 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 Electronically Signed: By: Genevie Ann M.D. On: 12/29/2018 23:29   Ct Angio Neck W Or Wo Contrast  Addendum Date: 12/29/2018   ADDENDUM REPORT: 12/29/2018 23:52 ADDENDUM: Study discussed by telephone with Dr. Jani Gravel on 12/29/2018 at 2337 hours. Electronically Signed   By: Genevie Ann M.D.   On: 12/29/2018 23:52   Result Date: 12/29/2018 CLINICAL DATA:  85 year old female with acute, subacute, and chronic right MCA territory infarcts. 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 EXAM: CT ANGIOGRAPHY HEAD AND NECK TECHNIQUE: Multidetector CT imaging of the head and neck was performed using the standard protocol during bolus administration of intravenous contrast. Multiplanar CT image reconstructions and MIPs were obtained to evaluate the vascular anatomy. Carotid stenosis measurements (when applicable) are obtained utilizing NASCET criteria, using the distal internal carotid diameter as the denominator. CONTRAST:  113mL OMNIPAQUE IOHEXOL 350 MG/ML SOLN COMPARISON:  Brain MRI 1305 hours today and earlier. Neck MRA 05/03/2018. FINDINGS: CT HEAD Brain: Patchy and confluent hypodensity in the right MCA territory corresponding to the various ischemia. No associated hemorrhage. No associated mass effect. No ventriculomegaly. Stable gray-white matter differentiation elsewhere since 12/23/2018. Left anterior paraspinal seen meningioma measuring about 2.7 centimeters diameter redemonstrated on series 5, image 9. Calvarium and skull base: No acute osseous abnormality identified. Paranasal sinuses: Visualized paranasal  sinuses and mastoids are stable and well pneumatized. Orbits: No acute orbit or scalp soft tissue findings. CTA NECK Skeleton: Severe cervical spine degeneration superimposed on congenital appearing incomplete segmentation of C5-C6. Partially visible thoracic scoliosis and right shoulder arthroplasty. No acute osseous abnormality identified. Upper chest: Negative. Other neck: Negative. Aortic arch: Mild to moderate arch atherosclerosis. Three vessel arch configuration. Right carotid system: No brachiocephalic or right CCA origin stenosis. Mild soft and calcified plaque in the right CCA proximal to the bifurcation. There is confluent calcification of the right ICA origin resulting in near occlusion of the vessel, string sign type reconstitution occurs at the bulb (series 13, image 65), and the vessel is patent but highly diminutive and thread-like to the skull base. See series 9, image 65. Left carotid system: No left CCA origin stenosis and minimal plaque proximal to the left carotid bifurcation. The left ICA origin is also heavily calcified with string sign stenosis. The distal bulb is diminutive, only 2-3 millimeters diameter. However, the remaining cervical left ICA has a more normal caliber and is patent to the skull base without additional stenosis. Vertebral arteries: No proximal right subclavian artery stenosis despite plaque. The right vertebral artery is non dominant. Calcified plaque near the right vertebral origin results in no significant stenosis. The right vertebral is patent to the skull base without stenosis. No proximal left subclavian artery stenosis  despite soft and calcified plaque. Dominant left vertebral artery with a normal origin. The left vertebral is patent to the skull base without stenosis. CTA HEAD Posterior circulation: Mildly dominant left V4 segment. No distal vertebral stenosis. Normal right PICA origin. The left AICA appears dominant and patent. Patent vertebrobasilar junction and  basilar artery without stenosis. Patent AICA and SCA origins. Patent right PCA origin with mild irregularity but no significant stenosis. There is a fetal type left PCA. The right posterior communicating artery is diminutive or absent. Left PCA branches are within normal limits. There is additional moderate stenosis in the right PCA P2 segment, but the distal right PCA is within normal limits. Anterior circulation: Thread-like enhancement of the right ICA at the skull base is noted but the right siphon is occluded in the distal petrous segment. Enhancement is reconstituted at the distal cavernous segment and the supraclinoid right ICA is patent although with asymmetrically decreased enhancement. The right ICA terminus is patent. The right A1 and M1 are patent but diminutive (series 15, image 16). The right MCA bifurcation is patent. The right MCA branches are patent but diminutive (series 14, image 19). The left ICA siphon is patent without stenosis despite some calcified plaque. Normal left posterior communicating artery origin. Normal left ICA terminus, left ACA origin. An anterior communicating artery is identified, although the left ACA remains larger than the right throughout. The ACA branches are within normal limits except for some mass effect related to the para falcine meningioma. The left MCA origin is normal with mild irregularity and stenosis in the left M1. The left MCA bifurcation is patent without stenosis. Left MCA branches are within normal limits. Venous sinuses: Patent. Anatomic variants: Dominant left vertebral artery. Review of the MIP images confirms the above findings IMPRESSION: 1. 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 - the Right ICA is thread-like distal to the severe stenosis, and occludes in the mid right siphon. - there is suboptimal reconstitution of the distal right ICA (see #2) and the right MCA and right ACA are  patent but diminutive. - the left ICA maintains a more normal caliber distal to the severe stenosis. And the left anterior circulation is largely normal. 2. There is no vertebral or basilar artery stenosis, but the Right Pcomm is diminutive or absent. There is up to moderate right PCA stenosis. 3. Expected CT appearance of right MCA ischemia. No intracranial hemorrhage or mass effect. 4. Chronic left para falcine meningioma. 5. 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 Electronically Signed: By: Genevie Ann M.D. On: 12/29/2018 23:29   Mr Brain Wo Contrast  Result Date: 12/29/2018 CLINICAL DATA:  Leg weakness with difficulty walking. Recent stroke. EXAM: MRI HEAD WITHOUT CONTRAST TECHNIQUE: Multiplanar, multiecho pulse sequences of the brain and surrounding structures were obtained without intravenous contrast. COMPARISON:  12/23/2018 FINDINGS: Brain: Many of the small acute right MCA infarcts on the recent prior MRI demonstrate diminished diffusion signal abnormality. However, there are multiple new acute right MCA infarcts involving frontal and parietal cortex which are overall larger than those on the prior study including a 3 cm infarct in the right precentral gyrus in the hand motor region which extends inferiorly into the centrum semiovale. There is no associated hemorrhage. A chronic right MCA infarct is again noted involving the insula, basal ganglia, and frontal lobe with hemosiderin staining and ex vacuo dilatation of the right frontal horn. An extra-axial left frontal mass anteriorly  along the falx is unchanged, measuring 2.1 x 2.8 x 3.6 cm with local mass effect but no significant edema. There is no midline shift or extra-axial fluid collection. Mild generalized cerebral atrophy is not greater than expected for age. Vascular: Chronic right ICA occlusion. Skull and upper cervical spine: Unremarkable bone marrow signal. Sinuses/Orbits: Bilateral  cataract extraction. Paranasal sinuses and mastoid air cells are clear. Other: None. IMPRESSION: 1. Multiple new acute right MCA infarcts involving the frontal and parietal lobes. 2. Subacute and chronic infarcts as above. 3. Unchanged left para falcine meningioma. 4. Chronic occlusion of the right ICA. Electronically Signed   By: Logan Bores M.D.   On: 12/29/2018 17:00   Mr Cervical Spine W Wo Contrast  Result Date: 12/29/2018 CLINICAL DATA:  Left upper extremity weakness. EXAM: MRI CERVICAL SPINE WITHOUT AND WITH CONTRAST TECHNIQUE: Multiplanar and multiecho pulse sequences of the cervical spine, to include the craniocervical junction and cervicothoracic junction, were obtained without and with intravenous contrast. CONTRAST:  6 cc Gadavist COMPARISON:  Cervical spine CT scan from 02/05/2017 FINDINGS: Alignment: Severe degenerative cervical spondylosis with multilevel disc disease and facet disease. Multilevel degenerative subluxations are noted. 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 Vertebrae: No bone lesions or fractures. Cord: Normal MR appearance of the cervical spinal cord. No cord lesions or syrinx. No areas of abnormal cord enhancement. Posterior Fossa, vertebral arteries, paraspinal tissues: No significant findings. Disc levels: C2-3: No significant findings. C3-4: Degenerative disc disease and facet disease with a bulging annulus and osteophytic ridging. No significant spinal stenosis. Left-sided disc osteophyte complex with mild left foraminal stenosis. C4-5: Advanced degenerative disc disease with a bulging degenerated annulus, osteophytic ridging and uncinate spurring. There is flattening of the ventral thecal sac and narrowing the ventral CSF space. No significant foraminal stenosis. C5-6: Klippel-Feil anomaly with partial fusion. No spinal or foraminal stenosis. C6-7: Advanced degenerative disc disease with a bulging degenerated annulus, osteophytic ridging and uncinate spurring. There is  flattening of the ventral thecal sac and near obliteration of the ventral CSF space. Mild foraminal narrowing bilaterally due to uncinate spurring. C7-T1: Bulging annulus and facet disease but no disc protrusions or spinal stenosis. Mild bilateral foraminal stenosis. IMPRESSION: 1. Advanced degenerative cervical spondylosis with multilevel disc disease and facet disease. 2. 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 3. Normal MR appearance of the cervical spinal cord. 4. Mild spinal and bilateral foraminal stenosis at C6-7. 5. Mild left foraminal stenosis at C3-4. 6. Mild bilateral foraminal stenosis at C7-T1. Electronically Signed   By: Marijo Sanes M.D.   On: 12/29/2018 16:51   Vas US Carotid  Result Date: 12/30/2018 Carotid Arterial Duplex Study Indications:       Bilateral ICA stenosis / string sign seen on CTA neck. Risk Factors:      Hypertension, hyperlipidemia, Diabetes. Comparison Study:  Carotid artery duplex 12/23/2018, CTA head and neck 12/29/2018. Performing Technologist: Maudry Mayhew MHA, RDMS, RVT, RDCS  Examination Guidelines: A complete evaluation includes B-mode imaging, spectral Doppler, color Doppler, and power Doppler as needed of all accessible portions of each vessel. Bilateral testing is considered an integral part of a complete examination. Limited examinations for reoccurring indications may be performed as noted.  Right Carotid Findings: +----------+--------+-------+--------+------------------------+----------------+             PSV cm/s EDV     Stenosis Describe                 Comments  cm/s                                                        +----------+--------+-------+--------+------------------------+----------------+  CCA Prox   28                        smooth and heterogenous                    +----------+--------+-------+--------+------------------------+----------------+  CCA Distal 30       4                heterogenous and                                                                  calcific                                   +----------+--------+-------+--------+------------------------+----------------+  ICA Prox   130      14               calcific                 String sign       +----------+--------+-------+--------+------------------------+----------------+  ICA Distal 14                                                 Thumped waveform  +----------+--------+-------+--------+------------------------+----------------+  ECA        380      24                                                          +----------+--------+-------+--------+------------------------+----------------+ +----------+--------+-------+----------------+-------------------+             PSV cm/s EDV cms Describe         Arm Pressure (mmHG)  +----------+--------+-------+----------------+-------------------+  Subclavian 90               Multiphasic, WNL                      +----------+--------+-------+----------------+-------------------+ +---------+--------+--+--------+--+---------+  Vertebral PSV cm/s 85 EDV cm/s 11 Antegrade  +---------+--------+--+--------+--+---------+  Left Carotid Findings: +----------+--------+--------+--------+-----------------------+--------+             PSV cm/s EDV cm/s Stenosis Describe                Comments  +----------+--------+--------+--------+-----------------------+--------+  CCA Prox   73       13                                                  +----------+--------+--------+--------+-----------------------+--------+  CCA Distal 69       12                smooth and heterogenous           +----------+--------+--------+--------+-----------------------+--------+  ICA Prox   263      56                calcific                          +----------+--------+--------+--------+-----------------------+--------+  ICA Distal 145      29                                                  +----------+--------+--------+--------+-----------------------+--------+   ECA        172                        calcific                          +----------+--------+--------+--------+-----------------------+--------+ +----------+--------+--------+----------------+-------------------+  Subclavian PSV cm/s EDV cm/s Describe         Arm Pressure (mmHG)  +----------+--------+--------+----------------+-------------------+             171               Multiphasic, WNL                      +----------+--------+--------+----------------+-------------------+ +---------+--------+--+--------+-+---------+  Vertebral PSV cm/s 47 EDV cm/s 9 Antegrade  +---------+--------+--+--------+-+---------+  Summary: Right Carotid: Sonographic string sign noted at origin of right internal carotid                artery, with thumped waveform of distal ICA, suggestive of distal                occlusion. This is consistent with CTA head and neck findings. Left Carotid: Elevated peak systolic velocities are suggestive of 60-79%               stenosis, however end diastolic velocities suggest upper range               40-59% stenosis. However, posterior acoustic shadowing secondary               to calcific plaque at bulb may obscure higher velocities. Vertebrals:  Bilateral vertebral arteries demonstrate antegrade flow. Subclavians: Normal flow hemodynamics were seen in bilateral subclavian              arteries. *See table(s) above for measurements and observations.     Preliminary         Scheduled Meds:  amLODipine  5 mg Oral Daily   aspirin  325 mg Oral Daily   atorvastatin  80 mg Oral q1800   clopidogrel  75 mg Oral Daily   enoxaparin (LOVENOX) injection  40 mg Subcutaneous Q24H   insulin aspart  0-9 Units Subcutaneous Q4H   levothyroxine  100 mcg Oral q morning - 10a   multivitamin  1 tablet Oral Daily   sodium chloride flush  3 mL Intravenous Q12H   sodium chloride flush  3 mL Intravenous Q12H   vitamin B-12  1,000 mcg Oral Daily   Continuous Infusions:   LOS: 1 day  Time spent: 35 minutes    Ladarrious Kirksey, MD [facility] Pager [phone]  If 7PM-7AM, please contact night-coverage [email or web address] Password [facility] 12/30/2018, 12:28 PM

## 2018-12-30 NOTE — Progress Notes (Signed)
2334: CBG 415  9 units insulin administered  Patient reportedly had meatloaf, collard greens along with rice, a brownie and ice cream for dinner at approximately 2000 despite being on a carb mod/heart healthy diet. Patient educated on the importance of managing carb intake despite ability to order foods high in carbs.   0021: CBG result 409 On call provider notified per protocol for CBG over 400 @ 0021  On call provider notified again @0113   No orders received   0139: CBG 217  0411: CBG 288  5 units insulin administered   Does patient need to be put back on home meds or on a more sensitive scale if stay in hospital continues?  Will continue to monitor.

## 2018-12-30 NOTE — Progress Notes (Signed)
OT Cancellation Note  Patient Details Name: Danielle [name] MRN: [id number] DOB: 12-26-1931   Cancelled Treatment:    Reason Eval/Treat Not Completed: Other (comment) OT order received, chart reviewed. Attempted to meet with pt x2, pt and husband fast asleep in room. Pt awakened on second attempt for a brief moment, stating she was wanting to sleep and for OT to come back another time. Pt then fell asleep mid sentence. Will continue to f/u to initiate OT POC.   Zenovia Jarred, MSOT, OTR/L Behavioral Health OT/ Acute Relief OT [facility] Office: [phone]  Zenovia Jarred 12/30/2018, 3:50 PM

## 2018-12-30 NOTE — Progress Notes (Signed)
Report given to receiving nurse Apolonio Schneiders at [facility].  Report given to carelink at this time.  Husband at bedside with pt. Aware that he is unable to travel with pt due to covid visitor restrictions throughout [place].

## 2018-12-31 DIAGNOSIS — Z8673 Personal history of transient ischemic attack (TIA), and cerebral infarction without residual deficits: Secondary | ICD-10-CM

## 2018-12-31 LAB — GLUCOSE, CAPILLARY
Glucose-Capillary: 217 mg/dL — ABNORMAL HIGH (ref 70–99)
Glucose-Capillary: 247 mg/dL — ABNORMAL HIGH (ref 70–99)
Glucose-Capillary: 277 mg/dL — ABNORMAL HIGH (ref 70–99)
Glucose-Capillary: 288 mg/dL — ABNORMAL HIGH (ref 70–99)
Glucose-Capillary: 409 mg/dL — ABNORMAL HIGH (ref 70–99)
Glucose-Capillary: 412 mg/dL — ABNORMAL HIGH (ref 70–99)
Glucose-Capillary: 475 mg/dL — ABNORMAL HIGH (ref 70–99)

## 2018-12-31 MED ORDER — INSULIN ASPART 100 UNIT/ML ~~LOC~~ SOLN
15.0000 [IU] | Freq: Once | SUBCUTANEOUS | Status: AC
  Administered 2018-12-31: 17:00:00 15 [IU] via SUBCUTANEOUS

## 2018-12-31 MED ORDER — INSULIN GLARGINE 100 UNIT/ML ~~LOC~~ SOLN
10.0000 [IU] | Freq: Once | SUBCUTANEOUS | Status: AC
  Administered 2018-12-31: 10 [IU] via SUBCUTANEOUS
  Filled 2018-12-31: qty 0.1

## 2018-12-31 MED ORDER — STROKE: EARLY STAGES OF RECOVERY BOOK
Freq: Once | Status: DC
  Filled 2018-12-31: qty 1

## 2018-12-31 NOTE — H&P (Signed)
Physical Medicine and Rehabilitation Admission H&P    Chief complaint: Weakness HPI: Danielle [name] is an 85 year old right-handed female history of hypertension, hyperlipidemia, diabetes mellitus, CVA 05/03/2018 maintained on aspirin 325 mg as well as left anterior parafalcine meningioma, she quit smoking 29 years ago, right shoulder arthroplasty.  Per chart review patient lives with spouse.  2 level home with bed and bathroom on Main level.  2 steps to entry.  Independent with assistive device prior to admission.  Presented 12/22/2020 [facility] with left upper extremity weakness x2 to 3 days.  She denied any chest pain or shortness of breath no nausea vomiting.  Cranial CT scan negative for acute changes.  Chronic microvascular ischemic changes as well as prior right MCA distribution infarct and prior right basal ganglia lacunar infarct.  Patient did not receive TPA.   Carotid Doppler showed chronically occluded right ICA as well as moderate left ICA atherosclerosis.  Left ICA stenosis estimated at 50 to 69%.  MRI/MRA showed multiple small acute right MCA infarcts as well as stable 3.7 cm left anterior parafalcine meningioma unchanged since 2019.  Echocardiogram with ejection fraction of 76% normal systolic function.  EEG negative for seizure.  COVID, hemoglobin 12.1, negative, glucose 329, hemoglobin A1c 10.3, TSH level 0.042. Patient was discharged home 12/27/2018 maintained on full-strength aspirin ambulating minimal guard.  Patient developed increased left-sided weakness and admitted to the office of Dr. Sinda Du 12/29/2018.  Follow-up MRI showed multiple new acute right MCA infarcts involving the frontal and parietal lobe.  Subacute and chronic infarcts also noted.  Patient was transferred to [facility] for further evaluation.  CTA of head and neck showed bilateral string sign stenosis of both ICA origins likely due to bulky calcification.  Vascular surgery consulted for  carotid stenosis Dr. Carlis Abbott who currently recommends no surgery at this time recommendations for outpatient follow-up after rehabilitation completed for CVA.  Plavix was added in addition to patient aspirin.  Subcutaneous Lovenox for DVT prophylaxis.  Tolerating a regular consistency diet.  Therapy evaluation completed and patient was admitted for a comprehensive rehab program.  Review of Systems  Constitutional: Negative for chills and fever.  HENT: Negative for hearing loss.   Eyes: Negative for blurred vision and double vision.  Respiratory: Negative for cough and shortness of breath.   Cardiovascular: Positive for leg swelling. Negative for chest pain and palpitations.  Gastrointestinal: Positive for constipation. Negative for heartburn, nausea and vomiting.  Genitourinary: Negative for dysuria and hematuria.  Musculoskeletal: Positive for joint pain and myalgias.  Skin: Negative for rash.  Neurological: Positive for sensory change and focal weakness. Negative for seizures.  Psychiatric/Behavioral: The patient has insomnia.   All other systems reviewed and are negative.  Past Medical History:  Diagnosis Date   Arthritis    "some joint pain once in awhile" (05/26/2017)   CVA (cerebral vascular accident) ([place]) 2019   History of kidney stones    Hyperlipemia    Hypertension    Hypothyroidism (acquired)    Pneumothorax 02/05/2017   right-after fall   Rib fractures 02/05/2017   right side   Stroke ([place])    Type II diabetes mellitus ([place])    Past Surgical History:  Procedure Laterality Date   AUGMENTATION MAMMAPLASTY Bilateral    DILATION AND CURETTAGE OF UTERUS     FRACTURE SURGERY     REVERSE SHOULDER ARTHROPLASTY Right 05/26/2017   REVERSE SHOULDER ARTHROPLASTY Right 05/26/2017   Procedure: REVERSE RIGHT SHOULDER ARTHROPLASTY;  Surgeon: Meredith Pel, MD;  Location: [place];  Service: Orthopedics;  Laterality: Right;   TONSILLECTOMY     TUBAL LIGATION      History reviewed. No pertinent family history. Social History:  reports that she quit smoking about 29 years ago. Her smoking use included cigarettes. She has a 40.00 pack-year smoking history. She has never used smokeless tobacco. She reports that she does not drink alcohol or use drugs. Allergies:  Allergies  Allergen Reactions   Fosamax [Alendronate Sodium] Other (See Comments)    MUSCLE ACHES   Prednisone     Feels jittery and nauseous   Medications Prior to Admission  Medication Sig Dispense Refill   amLODipine (NORVASC) 5 MG tablet Take 1 tablet (5 mg total) by mouth daily. 30 tablet 11   aspirin 325 MG tablet Take 1 tablet (325 mg total) by mouth daily. 30 tablet 12   atorvastatin (LIPITOR) 40 MG tablet Take 1 tablet (40 mg total) by mouth daily at 6 PM. 30 tablet 0   Calcium Carb-Cholecalciferol (CALCIUM 600 + D PO) Take 1 tablet by mouth daily.     guaiFENesin (MUCINEX) 600 MG 12 hr tablet Take 2 tablets (1,200 mg total) by mouth 2 (two) times daily. 30 tablet 1   insulin lispro (HUMALOG) 100 UNIT/ML injection Inject 12 Units into the skin daily.      Multiple Vitamin (MULTIVITAMIN WITH MINERALS) TABS Take 1 tablet by mouth daily.      multivitamin-lutein (OCUVITE-LUTEIN) CAPS capsule Take 1 capsule by mouth daily.     Omega-3 Fatty Acids (FISH OIL) 1000 MG CAPS Take 1,000 mg by mouth daily.     SYNTHROID 100 MCG tablet Take 100 mcg by mouth every morning.      TOUJEO SOLOSTAR 300 UNIT/ML SOPN Inject 20-22 Units into the skin every morning.      vitamin B-12 (CYANOCOBALAMIN) 1000 MCG tablet Take 1,000 mcg by mouth daily.      Drug Regimen Review Drug regimen was reviewed and remains appropriate with no significant issues identified  Home: Home Living Family/patient expects to be discharged to:: Private residence Living Arrangements: Spouse/significant other Available Help at Discharge: Family, Available 24 hours/day Type of Home: House Home Access: Stairs  to enter [employer] of Steps: 2 Entrance Stairs-Rails: Right Home Layout: Two level, Able to live on main level with bedroom/bathroom Alternate Level Stairs-Number of Steps: 12 + 5 steps to go upstairs Alternate Level Stairs-Rails: Left Bathroom Shower/Tub: Tub/shower unit, Curtain Bathroom Toilet: Handicapped height Bathroom Accessibility: Yes Home Equipment: Environmental consultant - 2 wheels, Cane - single point, Bedside commode, Grab bars - tub/shower, Wheelchair - manual, Shower seat   Functional History: Prior Function Level of Independence: Independent Comments: household and short distanced community ambulator with cane as needed  Functional Status:  Mobility: Bed Mobility Overal bed mobility: Needs Assistance Bed Mobility: Sit to Supine, Supine to Sit Supine to sit: Mod assist Sit to supine: Mod assist General bed mobility comments: ModA for scooting hips forward with bed pad to seated on edge of bed, modA for return to bed to assist with BLE negotiation back into bed Transfers Overall transfer level: Needs assistance Equipment used: 1 person hand held assist Transfers: Sit to/from Stand, Stand Pivot Transfers Sit to Stand: Mod assist Stand pivot transfers: Mod assist General transfer comment: ModA to stand with left knee blocked and pivot towards left and right. Difficulty sequencing and initiating steps Ambulation/Gait Ambulation/Gait assistance: Min assist Gait Distance (Feet): 15 Feet(x3) Assistive device: (  R railing) Gait Pattern/deviations: Step-to pattern, Decreased weight shift to left, Trunk flexed, Decreased step length - right General Gait Details: MinA for gait, holding onto R railing. Tactile facilitation provided for left knee flexion during swing phase and to decrease hyperextension during stance phase. Cues for upright, decreased left lateral lean, heel strike at initial contact. Decreased control of LLE, chair follow utilized Gait velocity: decreased Gait  velocity interpretation: <1.8 ft/sec, indicate of risk for recurrent falls    ADL: ADL Overall ADL's : Needs assistance/impaired Eating/Feeding: Minimal assistance Grooming: Brushing hair Upper Body Bathing: Moderate assistance Lower Body Bathing: Moderate assistance Toilet Transfer: Moderate assistance Toilet Transfer Details (indicate cue type and reason): pt requires (A) to elevate and position R UE  Functional mobility during ADLs: Moderate assistance General ADL Comments: Pt progressed from chair to other side of the bed and back. pt demonstrates scissor gate and circumduction. pt needs rest break and to reposition LB to start again. Pt needs (A) for balance and position of R UE  Cognition: Cognition Overall Cognitive Status: Impaired/Different from baseline Orientation Level: Oriented X4 Cognition Arousal/Alertness: Awake/alert Behavior During Therapy: WFL for tasks assessed/performed Overall Cognitive Status: Impaired/Different from baseline Area of Impairment: Awareness, Safety/judgement Safety/Judgement: Decreased awareness of safety, Decreased awareness of deficits Awareness: Emergent General Comments: A&Ox4, decreased safety awareness, asking husband to assist her back to bed after education provided by PT for transfers by STAFF only  Physical Exam: Blood pressure 107/90, pulse 78, temperature 97.9 F (36.6 C), temperature source Oral, resp. rate 14, height 5\' 5"  (1.651 m), weight 60.1 kg, SpO2 100 %. Physical Exam  Constitutional: No distress.  HENT:  Head: Normocephalic and atraumatic.  Eyes: Pupils are equal, round, and reactive to light. EOM are normal.  Neck: Normal range of motion. No tracheal deviation present. No thyromegaly present.  Cardiovascular: Normal rate and regular rhythm. Exam reveals no friction rub.  No murmur heard. Respiratory: Effort normal. No respiratory distress. She has no wheezes.  GI: Soft. She exhibits no distension. There is no  abdominal tenderness.  Musculoskeletal: Normal range of motion.        General: No edema.  Neurological: She is alert. She exhibits normal muscle tone. Coordination abnormal.  alert no acute distress.  Sitting up in chair.  Makes good eye contact with examiner follows full commands.  Fair awareness of deficits. Speech sl slurred. Left central 7. RUE and RLE 4/5.  LUE 0/5. LLE 3 to 3+/5 prox to distal. Senses pain and light touch in all 4 limbs.   Skin: Skin is warm and dry. She is not diaphoretic. No erythema.  Psychiatric: She has a normal mood and affect. Her behavior is normal. Judgment and thought content normal.    Results for orders placed or performed during the hospital encounter of 12/29/18 (from the past 48 hour(s))  Glucose, capillary     Status: Abnormal   Collection Time: 01/01/19  8:10 AM  Result Value Ref Range   Glucose-Capillary 314 (H) 70 - 99 mg/dL  Glucose, capillary     Status: Abnormal   Collection Time: 01/01/19 11:11 AM  Result Value Ref Range   Glucose-Capillary 421 (H) 70 - 99 mg/dL  Glucose, capillary     Status: Abnormal   Collection Time: 01/01/19 12:53 PM  Result Value Ref Range   Glucose-Capillary 381 (H) 70 - 99 mg/dL  Glucose, capillary     Status: Abnormal   Collection Time: 01/01/19  4:58 PM  Result Value Ref Range  Glucose-Capillary 293 (H) 70 - 99 mg/dL  Glucose, capillary     Status: Abnormal   Collection Time: 01/01/19  8:13 PM  Result Value Ref Range   Glucose-Capillary 363 (H) 70 - 99 mg/dL  Glucose, capillary     Status: Abnormal   Collection Time: 01/02/19 12:28 AM  Result Value Ref Range   Glucose-Capillary 301 (H) 70 - 99 mg/dL  Glucose, capillary     Status: Abnormal   Collection Time: 01/02/19  4:00 AM  Result Value Ref Range   Glucose-Capillary 236 (H) 70 - 99 mg/dL  Glucose, capillary     Status: Abnormal   Collection Time: 01/02/19  7:40 AM  Result Value Ref Range   Glucose-Capillary 225 (H) 70 - 99 mg/dL   Comment 1 Notify  RN    Comment 2 Document in Chart   Glucose, capillary     Status: Abnormal   Collection Time: 01/02/19 11:13 AM  Result Value Ref Range   Glucose-Capillary 407 (H) 70 - 99 mg/dL   Comment 1 Notify RN    Comment 2 Document in Chart   Glucose, capillary     Status: Abnormal   Collection Time: 01/02/19 12:43 PM  Result Value Ref Range   Glucose-Capillary 406 (H) 70 - 99 mg/dL   Comment 1 Notify RN    Comment 2 Document in Chart   Glucose, capillary     Status: Abnormal   Collection Time: 01/02/19  3:12 PM  Result Value Ref Range   Glucose-Capillary 329 (H) 70 - 99 mg/dL   Comment 1 Notify RN    Comment 2 Document in Chart   Glucose, capillary     Status: Abnormal   Collection Time: 01/02/19  7:12 PM  Result Value Ref Range   Glucose-Capillary 327 (H) 70 - 99 mg/dL  Glucose, capillary     Status: Abnormal   Collection Time: 01/02/19 11:23 PM  Result Value Ref Range   Glucose-Capillary 107 (H) 70 - 99 mg/dL  Glucose, capillary     Status: Abnormal   Collection Time: 01/03/19  4:45 AM  Result Value Ref Range   Glucose-Capillary 212 (H) 70 - 99 mg/dL   No results found.     Medical Problem List and Plan: 1.  Left-sided weakness secondary to recurrent right MCA infarct in setting of right ICA occlusion and severe left ICA stenosis.  -admit to inpatient rehab 2.  Antithrombotics: -DVT/anticoagulation: Lovenox  -antiplatelet therapy: Aspirin 325 mg daily and Plavix 75 mg daily 3. Pain Management: Tylenol as needed 4. Mood: Provide emotional support  -antipsychotic agents: N/A 5. Neuropsych: This patient is capable of making decisions on her own behalf. 6. Skin/Wound Care: Routine skin checks 7. Fluids/Electrolytes/Nutrition: Routine in and outs with follow-up chemistries 8. Severe left internal carotid stenosis.  Follow-up outpatient vascular surgery Dr. Carlis Abbott to consider intervention 9.  Hypertension.  Norvasc 5 mg daily.  Monitor with increased mobility 10.  Diabetes  mellitus.  Hemoglobin A1c 10.3.  SSI.  Check blood sugars before meals and at bedtime.  Lantus insulin 15 units daily  -adjust regimen as needed. 11.  Hyperlipidemia.  Lipitor 12.  Hypothyroidism.  Continue Synthroid        Cathlyn Parsons, PA-C 01/03/2019

## 2018-12-31 NOTE — Progress Notes (Addendum)
As noted in my consult note yesterday, I have not recommended right carotid endarterectomy.  She does have a right ICA string sign but then a distal occlusion of the ICA in the carotid siphon as demonstrated on CTA.  Her duplex yesterday had signals consistent with distal ICA occlusion given the thump signal and waveform.  I do not think revascularization at the bifurcation will be beneficial at this point.  I think she needs to recover from this acute event and then we can make a decision about her left carotid as an outpatient.  The left carotid is asymptomatic at this point.  I will arrange follow-up with me in 1 month with repeat carotid duplex.  I discussed this in detail with the patient and her husband yesterday as well as the son on the phone.  Recommend dual antiplatelet therapy.  Marty Heck, MD [facility] Office: [phone] Pager: [place]

## 2018-12-31 NOTE — Progress Notes (Signed)
PROGRESS NOTE    Danielle [name]  KCM:[id number] DOB: August 07, 1931 DOA: 12/29/2018 PCP: Sinda Du, MD   Brief Narrative:  85 year old female with history of hypertension, hyperlipidemia, diabetes mellitus, hypothyroidism, meningioma of left falx, CVA 05/03/2018, 12/23/2018 for right MCA stroke with a right carotid occlusion presented to primary care physician's office with the worsening of the left-sided weakness, ataxia.  CT of the neck showed positive for bilateral radiographic string sign stenosis of both ICA origins due to bulky calcification.  The right MCA is threadlike distal to the severe stenosis and occludes in the mid right siphon.  There is a suboptimal reconstitution of the distal right ICA and the right MCA and right ACA are patent but diminutive.  The left ICA maintains in normal caliber distal to the severe stenosis.  No vertebral artery or basilar artery stenosis.  MRI of the brain showed multiple new acute right MCA infarcts involving the frontal and parietal lobes.  Subacute and chronic infarcts of the insula, basal ganglia and frontal lobe with the hemosiderin staining and ex vacuo dilatation of the right frontal horn.  Neurology is consulted, recommended cerebral angiogram to further evaluate.  Continue with aspirin, Lipitor, Plavix.  Assessment & Plan:  ##CVA with the extension of the strokes from 12/23/2018 -Patient has worsening of the weakness in the left arm and left lower extremity. -Continue with aspirin, Plavix, statin -MRI of the brain showed multiple new acute right MCA infarcts, CT angiogram shows bilateral string sign with severe stenosis -Neurology stroke team is following with the patient, plan to do angiogram of the carotids on 12/30/2018 and possible intervention -Continue with physical therapy, Occupational Therapy, speech therapy evaluation commended inpatient rehab.  Patient wants to go home  ##Bilateral carotid artery stenosis -Consulted vascular  surgery, recommended patient to follow-up as an outpatient once patient recovers from recent stroke or possible carotid endarterectomy  ##Hypertension -Permissible hypertension  ##Hyperlipidemia -Continue with the Lipitor  ##Diabetes mellitus controlled -Last hemoglobin A1c was 10.3 -Keep the patient on Lantus 10 units daily -Follow-up on sliding scale insulin  ##Hypothyroidism -Continue with the Synthroid     Principal Problem:   Carotid stenosis, bilateral Active Problems:   Stroke ([place])     DVT prophylaxis: Lovenox  code Status: Full  family Communication: No family member present at bedside  disposition Plan: Home with home health   Consultants:   Neurology  Procedures:   Antimicrobials: None  Subjective: Complaining of weakness on the left side.  Wants to  eat  Objective: Vitals:   12/30/18 2013 12/31/18 0414 12/31/18 0744 12/31/18 1218  BP: (!) 117/57 (!) 120/50 (!) 148/62 139/61  Pulse: 90  75 79  Resp: 18 18 18 19   Temp: 98.2 F (36.8 C) 98 F (36.7 C) 98.1 F (36.7 C) 97.9 F (36.6 C)  TempSrc: Oral Oral Oral Oral  SpO2: 95%  97% 97%  Weight:      Height:        Intake/Output Summary (Last 24 hours) at 12/31/2018 1321 Last data filed at 12/31/2018 1129 Gross per 24 hour  Intake 3 ml  Output --  Net 3 ml   Filed Weights   12/29/18 1204 12/30/18 0303  Weight: 60 kg 60.1 kg    Examination:  General exam: Appears calm and comfortable  Respiratory system: Clear to auscultation. Respiratory effort normal. Cardiovascular system: S1 & S2 heard, RRR. No JVD, murmurs, rubs, gallops or clicks. No pedal edema. Gastrointestinal system: Abdomen is nondistended, soft and nontender. No  organomegaly or masses felt. Normal bowel sounds heard. Central nervous system: Alert and oriented.  Left upper extremity 0/5, left lower extremity 1/5, right upper and lower extremities 5/5.  Mild dysarthria Skin: No rashes, lesions or ulcers Psychiatry:  Judgement and insight appear normal. Mood & affect appropriate.     Data Reviewed: I have personally reviewed following labs and imaging studies  CBC: Recent Labs  Lab 12/29/18 1541  WBC 6.0  NEUTROABS 3.8  HGB 11.9*  HCT 37.4  MCV 93.7  PLT 662   Basic Metabolic Panel: Recent Labs  Lab 12/29/18 1541  NA 134*  K 4.5  CL 100  CO2 23  GLUCOSE 595*  BUN 23  CREATININE 0.98  CALCIUM 9.1   GFR: Estimated Creatinine Clearance: 38.5 mL/min (by C-G formula based on SCr of 0.98 mg/dL). Liver Function Tests: Recent Labs  Lab 12/29/18 1541  AST 51*  ALT 46*  ALKPHOS 117  BILITOT 0.8  PROT 6.4*  ALBUMIN 3.4*   No results for input(s): LIPASE, AMYLASE in the last 168 hours. No results for input(s): AMMONIA in the last 168 hours. Coagulation Profile: No results for input(s): INR, PROTIME in the last 168 hours. Cardiac Enzymes: No results for input(s): CKTOTAL, CKMB, CKMBINDEX, TROPONINI in the last 168 hours. BNP (last 3 results) No results for input(s): PROBNP in the last 8760 hours. HbA1C: No results for input(s): HGBA1C in the last 72 hours. CBG: Recent Labs  Lab 12/31/18 0021 12/31/18 0139 12/31/18 0411 12/31/18 0742 12/31/18 1213  GLUCAP 409* 217* 288* 277* 247*   Lipid Profile: No results for input(s): CHOL, HDL, LDLCALC, TRIG, CHOLHDL, LDLDIRECT in the last 72 hours. Thyroid Function Tests: Recent Labs    12/29/18 1541  TSH 0.122*   Anemia Panel: No results for input(s): VITAMINB12, FOLATE, FERRITIN, TIBC, IRON, RETICCTPCT in the last 72 hours. Sepsis Labs: No results for input(s): PROCALCITON, LATICACIDVEN in the last 168 hours.  Recent Results (from the past 240 hour(s))  SARS CORONAVIRUS 2 Nasal Swab Aptima Multi Swab     Status: None   Collection Time: 12/23/18  9:57 AM   Specimen: Aptima Multi Swab; Nasal Swab  Result Value Ref Range Status   SARS Coronavirus 2 NEGATIVE NEGATIVE Final    Comment: (NOTE) SARS-CoV-2 target nucleic acids  are NOT DETECTED. The SARS-CoV-2 RNA is generally detectable in upper and lower respiratory specimens during the acute phase of infection. Negative results do not preclude SARS-CoV-2 infection, do not rule out co-infections with other pathogens, and should not be used as the sole basis for treatment or other patient management decisions. Negative results must be combined with clinical observations, patient history, and epidemiological information. The expected result is Negative. Fact Sheet for Patients: SugarRoll.be Fact Sheet for Healthcare Providers: https://www.woods-mathews.com/ This test is not yet approved or cleared by the [place] FDA and  has been authorized for detection and/or diagnosis of SARS-CoV-2 by FDA under an Emergency Use Authorization (EUA). This EUA will remain  in effect (meaning this test can be used) for the duration of the COVID-19 declaration under Section 56 4(b)(1) of the Act, 21 U.S.C. section 360bbb-3(b)(1), unless the authorization is terminated or revoked sooner. Performed at [facility], [place] [address] [place], [place] [address]   SARS Coronavirus 2 [facility] order, Performed in [facility] hospital lab)     Status: None   Collection Time: 12/29/18  7:10 PM  Result Value Ref Range Status   SARS Coronavirus 2 NEGATIVE NEGATIVE Final  Comment: (NOTE) If result is NEGATIVE SARS-CoV-2 target nucleic acids are NOT DETECTED. The SARS-CoV-2 RNA is generally detectable in upper and lower  respiratory specimens during the acute phase of infection. The lowest  concentration of SARS-CoV-2 viral copies this assay can detect is 250  copies / mL. A negative result does not preclude SARS-CoV-2 infection  and should not be used as the sole basis for treatment or other  patient management decisions.  A negative result may occur with  improper specimen collection / handling, submission of specimen other  than  nasopharyngeal swab, presence of viral mutation(s) within the  areas targeted by this assay, and inadequate number of viral copies  (<250 copies / mL). A negative result must be combined with clinical  observations, patient history, and epidemiological information. If result is POSITIVE SARS-CoV-2 target nucleic acids are DETECTED. The SARS-CoV-2 RNA is generally detectable in upper and lower  respiratory specimens dur ing the acute phase of infection.  Positive  results are indicative of active infection with SARS-CoV-2.  Clinical  correlation with patient history and other diagnostic information is  necessary to determine patient infection status.  Positive results do  not rule out bacterial infection or co-infection with other viruses. If result is PRESUMPTIVE POSTIVE SARS-CoV-2 nucleic acids MAY BE PRESENT.   A presumptive positive result was obtained on the submitted specimen  and confirmed on repeat testing.  While 2019 novel coronavirus  (SARS-CoV-2) nucleic acids may be present in the submitted sample  additional confirmatory testing may be necessary for epidemiological  and / or clinical management purposes  to differentiate between  SARS-CoV-2 and other Sarbecovirus currently known to infect humans.  If clinically indicated additional testing with an alternate test  methodology [phone]) is advised. The SARS-CoV-2 RNA is generally  detectable in upper and lower respiratory sp ecimens during the acute  phase of infection. The expected result is Negative. Fact Sheet for Patients:  StrictlyIdeas.no Fact Sheet for Healthcare Providers: BankingDealers.co.za This test is not yet approved or cleared by the [place] FDA and has been authorized for detection and/or diagnosis of SARS-CoV-2 by FDA under an Emergency Use Authorization (EUA).  This EUA will remain in effect (meaning this test can be used) for the duration of  the COVID-19 declaration under Section 564(b)(1) of the Act, 21 U.S.C. section 360bbb-3(b)(1), unless the authorization is terminated or revoked sooner. Performed at [facility], [address], [place],  [address]          Radiology Studies: Ct Angio Head W Or Wo Contrast  Addendum Date: 12/29/2018   ADDENDUM REPORT: 12/29/2018 23:52 ADDENDUM: Study discussed by telephone with Dr. Jani Gravel on 12/29/2018 at 2337 hours. Electronically Signed   By: Genevie Ann M.D.   On: 12/29/2018 23:52   Result Date: 12/29/2018 CLINICAL DATA:  85 year old female with acute, subacute, and chronic right MCA territory infarcts. 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 EXAM: CT ANGIOGRAPHY HEAD AND NECK TECHNIQUE: Multidetector CT imaging of the head and neck was performed using the standard protocol during bolus administration of intravenous contrast. Multiplanar CT image reconstructions and MIPs were obtained to evaluate the vascular anatomy. Carotid stenosis measurements (when applicable) are obtained utilizing NASCET criteria, using the distal internal carotid diameter as the denominator. CONTRAST:  153mL OMNIPAQUE IOHEXOL 350 MG/ML SOLN COMPARISON:  Brain MRI 1305 hours today and earlier. Neck MRA 05/03/2018. FINDINGS: CT HEAD Brain: Patchy and confluent hypodensity in the right MCA territory corresponding to the  various ischemia. No associated hemorrhage. No associated mass effect. No ventriculomegaly. Stable gray-white matter differentiation elsewhere since 12/23/2018. Left anterior paraspinal seen meningioma measuring about 2.7 centimeters diameter redemonstrated on series 5, image 9. Calvarium and skull base: No acute osseous abnormality identified. Paranasal sinuses: Visualized paranasal sinuses and mastoids are stable and well pneumatized. Orbits: No acute orbit or scalp soft tissue findings. CTA NECK Skeleton: Severe cervical spine degeneration superimposed on congenital appearing incomplete  segmentation of C5-C6. Partially visible thoracic scoliosis and right shoulder arthroplasty. No acute osseous abnormality identified. Upper chest: Negative. Other neck: Negative. Aortic arch: Mild to moderate arch atherosclerosis. Three vessel arch configuration. Right carotid system: No brachiocephalic or right CCA origin stenosis. Mild soft and calcified plaque in the right CCA proximal to the bifurcation. There is confluent calcification of the right ICA origin resulting in near occlusion of the vessel, string sign type reconstitution occurs at the bulb (series 13, image 65), and the vessel is patent but highly diminutive and thread-like to the skull base. See series 9, image 65. Left carotid system: No left CCA origin stenosis and minimal plaque proximal to the left carotid bifurcation. The left ICA origin is also heavily calcified with string sign stenosis. The distal bulb is diminutive, only 2-3 millimeters diameter. However, the remaining cervical left ICA has a more normal caliber and is patent to the skull base without additional stenosis. Vertebral arteries: No proximal right subclavian artery stenosis despite plaque. The right vertebral artery is non dominant. Calcified plaque near the right vertebral origin results in no significant stenosis. The right vertebral is patent to the skull base without stenosis. No proximal left subclavian artery stenosis despite soft and calcified plaque. Dominant left vertebral artery with a normal origin. The left vertebral is patent to the skull base without stenosis. CTA HEAD Posterior circulation: Mildly dominant left V4 segment. No distal vertebral stenosis. Normal right PICA origin. The left AICA appears dominant and patent. Patent vertebrobasilar junction and basilar artery without stenosis. Patent AICA and SCA origins. Patent right PCA origin with mild irregularity but no significant stenosis. There is a fetal type left PCA. The right posterior communicating artery  is diminutive or absent. Left PCA branches are within normal limits. There is additional moderate stenosis in the right PCA P2 segment, but the distal right PCA is within normal limits. Anterior circulation: Thread-like enhancement of the right ICA at the skull base is noted but the right siphon is occluded in the distal petrous segment. Enhancement is reconstituted at the distal cavernous segment and the supraclinoid right ICA is patent although with asymmetrically decreased enhancement. The right ICA terminus is patent. The right A1 and M1 are patent but diminutive (series 15, image 16). The right MCA bifurcation is patent. The right MCA branches are patent but diminutive (series 14, image 19). The left ICA siphon is patent without stenosis despite some calcified plaque. Normal left posterior communicating artery origin. Normal left ICA terminus, left ACA origin. An anterior communicating artery is identified, although the left ACA remains larger than the right throughout. The ACA branches are within normal limits except for some mass effect related to the para falcine meningioma. The left MCA origin is normal with mild irregularity and stenosis in the left M1. The left MCA bifurcation is patent without stenosis. Left MCA branches are within normal limits. Venous sinuses: Patent. Anatomic variants: Dominant left vertebral artery. Review of the MIP images confirms the above findings IMPRESSION: 1. Positive for BILATERAL Radiographic-String-Sign stenoses of BOTH ICA  origins due to bulky calcification. - the Right ICA is thread-like distal to the severe stenosis, and occludes in the mid right siphon. - there is suboptimal reconstitution of the distal right ICA (see #2) and the right MCA and right ACA are patent but diminutive. - the left ICA maintains a more normal caliber distal to the severe stenosis. And the left anterior circulation is largely normal. 2. There is no vertebral or basilar artery stenosis, but the  Right Pcomm is diminutive or absent. There is up to moderate right PCA stenosis. 3. Expected CT appearance of right MCA ischemia. No intracranial hemorrhage or mass effect. 4. Chronic left para falcine meningioma. 5. 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 Electronically Signed: By: Genevie Ann M.D. On: 12/29/2018 23:29   Ct Angio Neck W Or Wo Contrast  Addendum Date: 12/29/2018   ADDENDUM REPORT: 12/29/2018 23:52 ADDENDUM: Study discussed by telephone with Dr. Jani Gravel on 12/29/2018 at 2337 hours. Electronically Signed   By: Genevie Ann M.D.   On: 12/29/2018 23:52   Result Date: 12/29/2018 CLINICAL DATA:  85 year old female with acute, subacute, and chronic right MCA territory infarcts. 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 EXAM: CT ANGIOGRAPHY HEAD AND NECK TECHNIQUE: Multidetector CT imaging of the head and neck was performed using the standard protocol during bolus administration of intravenous contrast. Multiplanar CT image reconstructions and MIPs were obtained to evaluate the vascular anatomy. Carotid stenosis measurements (when applicable) are obtained utilizing NASCET criteria, using the distal internal carotid diameter as the denominator. CONTRAST:  176mL OMNIPAQUE IOHEXOL 350 MG/ML SOLN COMPARISON:  Brain MRI 1305 hours today and earlier. Neck MRA 05/03/2018. FINDINGS: CT HEAD Brain: Patchy and confluent hypodensity in the right MCA territory corresponding to the various ischemia. No associated hemorrhage. No associated mass effect. No ventriculomegaly. Stable gray-white matter differentiation elsewhere since 12/23/2018. Left anterior paraspinal seen meningioma measuring about 2.7 centimeters diameter redemonstrated on series 5, image 9. Calvarium and skull base: No acute osseous abnormality identified. Paranasal sinuses: Visualized paranasal sinuses and mastoids are stable and well pneumatized. Orbits: No acute orbit or scalp soft  tissue findings. CTA NECK Skeleton: Severe cervical spine degeneration superimposed on congenital appearing incomplete segmentation of C5-C6. Partially visible thoracic scoliosis and right shoulder arthroplasty. No acute osseous abnormality identified. Upper chest: Negative. Other neck: Negative. Aortic arch: Mild to moderate arch atherosclerosis. Three vessel arch configuration. Right carotid system: No brachiocephalic or right CCA origin stenosis. Mild soft and calcified plaque in the right CCA proximal to the bifurcation. There is confluent calcification of the right ICA origin resulting in near occlusion of the vessel, string sign type reconstitution occurs at the bulb (series 13, image 65), and the vessel is patent but highly diminutive and thread-like to the skull base. See series 9, image 65. Left carotid system: No left CCA origin stenosis and minimal plaque proximal to the left carotid bifurcation. The left ICA origin is also heavily calcified with string sign stenosis. The distal bulb is diminutive, only 2-3 millimeters diameter. However, the remaining cervical left ICA has a more normal caliber and is patent to the skull base without additional stenosis. Vertebral arteries: No proximal right subclavian artery stenosis despite plaque. The right vertebral artery is non dominant. Calcified plaque near the right vertebral origin results in no significant stenosis. The right vertebral is patent to the skull base without stenosis. No proximal left subclavian artery stenosis despite soft and calcified plaque. Dominant left vertebral  artery with a normal origin. The left vertebral is patent to the skull base without stenosis. CTA HEAD Posterior circulation: Mildly dominant left V4 segment. No distal vertebral stenosis. Normal right PICA origin. The left AICA appears dominant and patent. Patent vertebrobasilar junction and basilar artery without stenosis. Patent AICA and SCA origins. Patent right PCA origin with  mild irregularity but no significant stenosis. There is a fetal type left PCA. The right posterior communicating artery is diminutive or absent. Left PCA branches are within normal limits. There is additional moderate stenosis in the right PCA P2 segment, but the distal right PCA is within normal limits. Anterior circulation: Thread-like enhancement of the right ICA at the skull base is noted but the right siphon is occluded in the distal petrous segment. Enhancement is reconstituted at the distal cavernous segment and the supraclinoid right ICA is patent although with asymmetrically decreased enhancement. The right ICA terminus is patent. The right A1 and M1 are patent but diminutive (series 15, image 16). The right MCA bifurcation is patent. The right MCA branches are patent but diminutive (series 14, image 19). The left ICA siphon is patent without stenosis despite some calcified plaque. Normal left posterior communicating artery origin. Normal left ICA terminus, left ACA origin. An anterior communicating artery is identified, although the left ACA remains larger than the right throughout. The ACA branches are within normal limits except for some mass effect related to the para falcine meningioma. The left MCA origin is normal with mild irregularity and stenosis in the left M1. The left MCA bifurcation is patent without stenosis. Left MCA branches are within normal limits. Venous sinuses: Patent. Anatomic variants: Dominant left vertebral artery. Review of the MIP images confirms the above findings IMPRESSION: 1. 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 - the Right ICA is thread-like distal to the severe stenosis, and occludes in the mid right siphon. - there is suboptimal reconstitution of the distal right ICA (see #2) and the right MCA and right ACA are patent but diminutive. - the left ICA maintains a more normal caliber distal to the severe  stenosis. And the left anterior circulation is largely normal. 2. There is no vertebral or basilar artery stenosis, but the Right Pcomm is diminutive or absent. There is up to moderate right PCA stenosis. 3. Expected CT appearance of right MCA ischemia. No intracranial hemorrhage or mass effect. 4. Chronic left para falcine meningioma. 5. 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 Electronically Signed: By: Genevie Ann M.D. On: 12/29/2018 23:29   Mr Brain Wo Contrast  Result Date: 12/29/2018 CLINICAL DATA:  Leg weakness with difficulty walking. Recent stroke. EXAM: MRI HEAD WITHOUT CONTRAST TECHNIQUE: Multiplanar, multiecho pulse sequences of the brain and surrounding structures were obtained without intravenous contrast. COMPARISON:  12/23/2018 FINDINGS: Brain: Many of the small acute right MCA infarcts on the recent prior MRI demonstrate diminished diffusion signal abnormality. However, there are multiple new acute right MCA infarcts involving frontal and parietal cortex which are overall larger than those on the prior study including a 3 cm infarct in the right precentral gyrus in the hand motor region which extends inferiorly into the centrum semiovale. There is no associated hemorrhage. A chronic right MCA infarct is again noted involving the insula, basal ganglia, and frontal lobe with hemosiderin staining and ex vacuo dilatation of the right frontal horn. An extra-axial left frontal mass anteriorly along the falx is unchanged, measuring 2.1 x  2.8 x 3.6 cm with local mass effect but no significant edema. There is no midline shift or extra-axial fluid collection. Mild generalized cerebral atrophy is not greater than expected for age. Vascular: Chronic right ICA occlusion. Skull and upper cervical spine: Unremarkable bone marrow signal. Sinuses/Orbits: Bilateral cataract extraction. Paranasal sinuses and mastoid air cells are clear. Other: None. IMPRESSION:  1. Multiple new acute right MCA infarcts involving the frontal and parietal lobes. 2. Subacute and chronic infarcts as above. 3. Unchanged left para falcine meningioma. 4. Chronic occlusion of the right ICA. Electronically Signed   By: Logan Bores M.D.   On: 12/29/2018 17:00   Mr Cervical Spine W Wo Contrast  Result Date: 12/29/2018 CLINICAL DATA:  Left upper extremity weakness. EXAM: MRI CERVICAL SPINE WITHOUT AND WITH CONTRAST TECHNIQUE: Multiplanar and multiecho pulse sequences of the cervical spine, to include the craniocervical junction and cervicothoracic junction, were obtained without and with intravenous contrast. CONTRAST:  6 cc Gadavist COMPARISON:  Cervical spine CT scan from 02/05/2017 FINDINGS: Alignment: Severe degenerative cervical spondylosis with multilevel disc disease and facet disease. Multilevel degenerative subluxations are noted. 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 Vertebrae: No bone lesions or fractures. Cord: Normal MR appearance of the cervical spinal cord. No cord lesions or syrinx. No areas of abnormal cord enhancement. Posterior Fossa, vertebral arteries, paraspinal tissues: No significant findings. Disc levels: C2-3: No significant findings. C3-4: Degenerative disc disease and facet disease with a bulging annulus and osteophytic ridging. No significant spinal stenosis. Left-sided disc osteophyte complex with mild left foraminal stenosis. C4-5: Advanced degenerative disc disease with a bulging degenerated annulus, osteophytic ridging and uncinate spurring. There is flattening of the ventral thecal sac and narrowing the ventral CSF space. No significant foraminal stenosis. C5-6: Klippel-Feil anomaly with partial fusion. No spinal or foraminal stenosis. C6-7: Advanced degenerative disc disease with a bulging degenerated annulus, osteophytic ridging and uncinate spurring. There is flattening of the ventral thecal sac and near obliteration of the ventral CSF space. Mild foraminal  narrowing bilaterally due to uncinate spurring. C7-T1: Bulging annulus and facet disease but no disc protrusions or spinal stenosis. Mild bilateral foraminal stenosis. IMPRESSION: 1. Advanced degenerative cervical spondylosis with multilevel disc disease and facet disease. 2. 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 3. Normal MR appearance of the cervical spinal cord. 4. Mild spinal and bilateral foraminal stenosis at C6-7. 5. Mild left foraminal stenosis at C3-4. 6. Mild bilateral foraminal stenosis at C7-T1. Electronically Signed   By: Marijo Sanes M.D.   On: 12/29/2018 16:51   Vas US Carotid  Result Date: 12/30/2018 Carotid Arterial Duplex Study Indications:       Bilateral ICA stenosis / string sign seen on CTA neck. Risk Factors:      Hypertension, hyperlipidemia, Diabetes. Comparison Study:  Carotid artery duplex 12/23/2018, CTA head and neck 12/29/2018. Performing Technologist: Maudry Mayhew MHA, RDMS, RVT, RDCS  Examination Guidelines: A complete evaluation includes B-mode imaging, spectral Doppler, color Doppler, and power Doppler as needed of all accessible portions of each vessel. Bilateral testing is considered an integral part of a complete examination. Limited examinations for reoccurring indications may be performed as noted.  Right Carotid Findings: +----------+--------+-------+--------+------------------------+----------------+             PSV cm/s EDV     Stenosis Describe                 Comments  cm/s                                                        +----------+--------+-------+--------+------------------------+----------------+  CCA Prox   28                        smooth and heterogenous                    +----------+--------+-------+--------+------------------------+----------------+  CCA Distal 30       4                heterogenous and                                                                 calcific                                    +----------+--------+-------+--------+------------------------+----------------+  ICA Prox   130      14               calcific                 String sign       +----------+--------+-------+--------+------------------------+----------------+  ICA Distal 14                                                 Thumped waveform  +----------+--------+-------+--------+------------------------+----------------+  ECA        380      24                                                          +----------+--------+-------+--------+------------------------+----------------+ +----------+--------+-------+----------------+-------------------+             PSV cm/s EDV cms Describe         Arm Pressure (mmHG)  +----------+--------+-------+----------------+-------------------+  Subclavian 90               Multiphasic, WNL                      +----------+--------+-------+----------------+-------------------+ +---------+--------+--+--------+--+---------+  Vertebral PSV cm/s 85 EDV cm/s 11 Antegrade  +---------+--------+--+--------+--+---------+  Left Carotid Findings: +----------+--------+--------+--------+-----------------------+--------+             PSV cm/s EDV cm/s Stenosis Describe                Comments  +----------+--------+--------+--------+-----------------------+--------+  CCA Prox   73       13                                                  +----------+--------+--------+--------+-----------------------+--------+  CCA Distal 69       12                smooth and heterogenous           +----------+--------+--------+--------+-----------------------+--------+  ICA Prox   263      56                calcific                          +----------+--------+--------+--------+-----------------------+--------+  ICA Distal 145      29                                                  +----------+--------+--------+--------+-----------------------+--------+  ECA        172                        calcific                           +----------+--------+--------+--------+-----------------------+--------+ +----------+--------+--------+----------------+-------------------+  Subclavian PSV cm/s EDV cm/s Describe         Arm Pressure (mmHG)  +----------+--------+--------+----------------+-------------------+             171               Multiphasic, WNL                      +----------+--------+--------+----------------+-------------------+ +---------+--------+--+--------+-+---------+  Vertebral PSV cm/s 47 EDV cm/s 9 Antegrade  +---------+--------+--+--------+-+---------+  Summary: Right Carotid: Sonographic string sign noted at origin of right internal carotid                artery, with thumped waveform of distal ICA, suggestive of distal                occlusion. This is consistent with CTA head and neck findings. Left Carotid: Elevated peak systolic velocities are suggestive of 60-79%               stenosis, however end diastolic velocities suggest upper range               40-59% stenosis. However, posterior acoustic shadowing secondary               to calcific plaque at bulb may obscure higher velocities. Vertebrals:  Bilateral vertebral arteries demonstrate antegrade flow. Subclavians: Normal flow hemodynamics were seen in bilateral subclavian              arteries. *See table(s) above for measurements and observations.  Electronically signed by Monica Martinez MD on 12/30/2018 at 3:29:37 PM.    Final         Scheduled Meds:  amLODipine  5 mg Oral Daily   aspirin  325 mg Oral Daily   atorvastatin  80 mg Oral q1800   clopidogrel  75 mg Oral Daily   enoxaparin (LOVENOX) injection  40 mg Subcutaneous Q24H   insulin aspart  0-9 Units Subcutaneous Q4H   levothyroxine  100 mcg Oral q morning - 10a   multivitamin  1 tablet Oral Daily   sodium chloride flush  3 mL Intravenous Q12H   sodium chloride flush  3 mL Intravenous Q12H   vitamin B-12  1,000 mcg Oral Daily  Continuous Infusions:   LOS: 2 days    Time  spent: 35 minutes    Kaelyn Nauta, MD [facility] Pager [phone]  If 7PM-7AM, please contact night-coverage [email or web address] Password [email or web address] 12/31/2018, 1:21 PM

## 2018-12-31 NOTE — Progress Notes (Addendum)
Physical Therapy Treatment Patient Details Name: Danielle [name] MRN: [id number] DOB: 03/01/1932 Today's Date: 12/31/2018    History of Present Illness Patient is a 85 y/o female with history of CVA presented with left UE weakness to the ED. MRI was completed on 12/23/18 showing multiple small acute right MCA infarcts. She was discharged from hospital to home on 12/27/18. had difficulties getting out of car after discharge and slid to ground, son and father had to help get her up. Per husband, they went to the ER, she was seen and sent home. They were home yesterday and husband had increased difficulty transfering patient. MD called to follow up wtih patient and wanted her admitted to hospital.    PT Comments    Pt progressing very well towards her physical therapy goals. Session focused on gait training. Requiring min assist for functional mobility. Ambulating 15 feet x 3 holding onto right railing with chair follow. Pt with noted left knee hyperextension. Remains excellent candidate for CIR.    Follow Up Recommendations  CIR;Supervision/Assistance - 24 hour     Equipment Recommendations  Other (comment)(quad cane)    Recommendations for Other Services       Precautions / Restrictions Precautions Precautions: Fall Precaution Comments: LUE neglect, hemiparesis Restrictions Weight Bearing Restrictions: No    Mobility  Bed Mobility               General bed mobility comments: in chair ona rrival   Transfers Overall transfer level: Needs assistance Equipment used: 1 person hand held assist Transfers: Sit to/from Stand Sit to Stand: Min assist Stand pivot transfers: Mod assist       General transfer comment: sit <> stand x 3 with minA to power up, pulling up with RUE on wall railing. L knee blocked but did not buckle  Ambulation/Gait Ambulation/Gait assistance: Min assist Gait Distance (Feet): 15 Feet(x3) Assistive device: (R railing) Gait Pattern/deviations:  Step-to pattern;Decreased weight shift to left;Trunk flexed;Decreased step length - right Gait velocity: decreased   General Gait Details: Use of right rail, verbal/tactile cues provided for upright posture (tendency for left lateral lean), decreased left knee hyperextension in mid stance, decreased left step length, heel strike at initial contact. Daughter following with chair    Stairs             Wheelchair Mobility    Modified Rankin (Stroke Patients Only) Modified Rankin (Stroke Patients Only) Pre-Morbid Rankin Score: Moderate disability Modified Rankin: Moderately severe disability     Balance Overall balance assessment: Needs assistance Sitting-balance support: Feet supported;Single extremity supported Sitting balance-Leahy Scale: Fair Sitting balance - Comments: Right lateral lean able to self correct with cues    Standing balance support: Single extremity supported;During functional activity Standing balance-Leahy Scale: Fair                              Cognition Arousal/Alertness: Awake/alert Behavior During Therapy: WFL for tasks assessed/performed Overall Cognitive Status: Impaired/Different from baseline Area of Impairment: Awareness;Safety/judgement                         Safety/Judgement: Decreased awareness of safety;Decreased awareness of deficits Awareness: Emergent   General Comments: A&Ox4, decreased safety awareness      Exercises Other Exercises Other Exercises: PROM D1/D2 LUE    General Comments        Pertinent Vitals/Pain Pain Assessment: Faces Faces Pain Scale: Hurts a little  bit Pain Location: left shoulder Pain Descriptors / Indicators: Guarding;Grimacing;Sore Pain Intervention(s): Monitored during session    Home Living Family/patient expects to be discharged to:: Private residence Living Arrangements: Spouse/significant other Available Help at Discharge: Family;Available 24 hours/day Type of Home:  House Home Access: Stairs to enter Entrance Stairs-Rails: Right Home Layout: Two level;Able to live on main level with bedroom/bathroom        Prior Function Level of Independence: Independent          PT Goals (current goals can now be found in the care plan section) Acute Rehab PT Goals Patient Stated Goal: to get my L arm back Potential to Achieve Goals: Good Progress towards PT goals: Progressing toward goals    Frequency    Min 4X/week      PT Plan Frequency needs to be updated    Co-evaluation              AM-PAC PT "6 Clicks" Mobility   Outcome Measure  Help needed turning from your back to your side while in a flat bed without using bedrails?: A Little Help needed moving from lying on your back to sitting on the side of a flat bed without using bedrails?: A Lot Help needed moving to and from a bed to a chair (including a wheelchair)?: A Lot Help needed standing up from a chair using your arms (e.g., wheelchair or bedside chair)?: A Little Help needed to walk in hospital room?: A Lot Help needed climbing 3-5 steps with a railing? : Total 6 Click Score: 13    End of Session Equipment Utilized During Treatment: Gait belt Activity Tolerance: Patient tolerated treatment well Patient left: in chair;with call bell/phone within reach;with chair alarm set;with family/visitor present Nurse Communication: Mobility status PT Visit Diagnosis: Unsteadiness on feet (R26.81);Other abnormalities of gait and mobility (R26.89);Muscle weakness (generalized) (M62.81)     Time: 5366-4403 PT Time Calculation (min) (ACUTE ONLY): 27 min  Charges:  $Gait Training: 23-37 mins                     Ellamae Sia, [place], DPT Acute Rehabilitation Services Pager [phone] Office [phone]    Willy Eddy 12/31/2018, 5:28 PM

## 2018-12-31 NOTE — Progress Notes (Signed)
Inpatient Diabetes Program Recommendations  AACE/ADA: New Consensus Statement on Inpatient Glycemic Control (2015)  Target Ranges:  Prepandial:   less than 140 mg/dL      Peak postprandial:   less than 180 mg/dL (1-2 hours)      Critically ill patients:  140 - 180 mg/dL   Lab Results  Component Value Date   GLUCAP 277 (H) 12/31/2018   HGBA1C 10.3 (H) 12/23/2018    Review of Glycemic Control  Diabetes history: DM2 Outpatient Diabetes medications: Toujeo 20-22 units QAM, Humalog 12 units once Current orders for Inpatient glycemic control: Novolog 0-9 units Q4H.  Inpatient Diabetes Program Recommendations:     Add Lantus 15 units QD  Will continue to follow.  Thank you. Lorenda Peck, RD, LDN, CDE Inpatient Diabetes Coordinator [phone]

## 2018-12-31 NOTE — Social Work (Signed)
CSW acknowledging consult for SNF placement. Per notes CIR to initiate insurance approval, will follow as needed.   Westley Hummer, MSW, [place] [facility] [phone]

## 2018-12-31 NOTE — Progress Notes (Addendum)
Inpatient Rehabilitation Admissions Coordinator  Inpatient rehab consult received. I met with pt and her daughter at bedside for rehab assessment. We discussed goals an expectations of an inpt rehab admit. They are in agreement and prefer CIR. I will begin insurance authorization once OT eval complete for possible admit Monday pending insurance approval. I will follow up on Monday.  Danne Baxter, RN, MSN Rehab Admissions Coordinator [phone] 12/31/2018 11:52 AM

## 2018-12-31 NOTE — Progress Notes (Signed)
OT EVAL      PT admitted with R MCA. Pt currently with functional limitiations due to the deficits listed below (see OT problem list). Pt currently requires mod (A) for transfers and lack of use of L UE. Pt with mod to max (A) for all adls. Pt will benefit from skilled OT to increase their independence and safety with adls and balance to allow discharge CIR.   12/30/18 1600  OT Visit Information  Assistance Needed +1  History of Present Illness Patient is a 85 y/o female with history of CVA presented with left UE weakness to the ED. MRI was completed on 12/23/18 showing multiple small acute right MCA infarcts. She was discharged from hospital to home on 12/27/18. had difficulties getting out of car after discharge and slid to ground, son and father had to help get her up. Per husband, they went to the ER, she was seen and sent home. They were home yesterday and husband had increased difficulty transfering patient. MD called to follow up wtih patient and wanted her admitted to hospital.  Precautions  Precautions Fall  Precaution Comments LUE neglect, hemiparesis  Restrictions  Weight Bearing Restrictions No  Home Living  Family/patient expects to be discharged to: Private residence  Living Arrangements Spouse/significant other  Available Help at Discharge Family;Available 24 hours/day  Type of Home House  Home Access Stairs to enter  Entrance Stairs-Number of Steps 2  Entrance Stairs-Rails Right  Home Layout Two level;Able to live on main level with bedroom/bathroom  Alternate Level Stairs-Number of Steps 12 + 5 steps to go upstairs  Alternate Level Stairs-Rails Left  Bathroom Shower/Tub Tub/shower unit;Curtain  Bathroom Toilet Handicapped height  Bathroom Accessibility Yes  Prior Function  Level of Independence Independent  Communication  Communication No difficulties  Pain Assessment  Pain Assessment Faces  Faces Pain Scale 2  Pain Location left shoulder  Pain Descriptors /  Indicators Guarding;Grimacing;Sore  Pain Intervention(s) Monitored during session;Repositioned  Cognition  Arousal/Alertness Awake/alert  Behavior During Therapy WFL for tasks assessed/performed  Overall Cognitive Status Impaired/Different from baseline  Area of Impairment Awareness;Safety/judgement  Safety/Judgement Decreased awareness of safety;Decreased awareness of deficits  Awareness Emergent  General Comments A&Ox4, decreased safety awareness, asking husband to assist her back to bed after education provided by PT for transfers by STAFF only  Upper Extremity Assessment  Upper Extremity Assessment LUE deficits/detail  LUE Deficits / Details No scapular movement, no bicep/ tricep, wrist movement, pt did flicker 2nd digit and did fincker thumb once for each during session. pt however unabel to return demo.   LUE Sensation decreased light touch  LUE Coordination decreased fine motor;decreased gross motor  Lower Extremity Assessment  Lower Extremity Assessment Defer to PT evaluation  Cervical / Trunk Assessment  Cervical / Trunk Assessment Normal  ADL  Overall ADL's  Needs assistance/impaired  Eating/Feeding Minimal assistance  Grooming Brushing hair  Upper Body Bathing Moderate assistance  Lower Body Bathing Moderate assistance  Toilet Transfer Moderate assistance  Toilet Transfer Details (indicate cue type and reason) pt requires (A) to elevate and position R UE   Functional mobility during ADLs Moderate assistance  General ADL Comments Pt progressed from chair to other side of the bed and back. pt demonstrates scissor gate and circumduction. pt needs rest break and to reposition LB to start again. Pt needs (A) for balance and position of R UE  Vision- History  Baseline Vision/History No visual deficits  Vision- Assessment  Additional Comments able to read entire  section of CIR handout and able to recall 3 portions of information immediately  Bed Mobility  General bed mobility  comments in chair ona rrival   Transfers  Overall transfer level Needs assistance  Equipment used 1 person hand held assist  Transfers Sit to/from Stand;Stand Pivot Transfers  Sit to Stand Mod assist  Stand pivot transfers Mod assist  General transfer comment sit<>stand x4 this session with min (A) to power up and mod (A) to transfer. pt requires increase time to initiate a step  Balance  Overall balance assessment Needs assistance  Sitting-balance support Feet supported;Single extremity supported  Sitting balance-Leahy Scale Fair  Sitting balance - Comments Right lateral lean able to self correct with cues   Standing balance support Single extremity supported;During functional activity  Standing balance-Leahy Scale Fair  General Comments  General comments (skin integrity, edema, etc.) educated on L UE positioning for safety   Other Exercises  Other Exercises Education provided to patient and daughter regarding proper positioning of LUE while sitting up in recliner  OT - End of Session  Equipment Utilized During Treatment Gait belt  Activity Tolerance Patient tolerated treatment well  Patient left in chair;with call bell/phone within reach;with chair alarm set  Nurse Communication Mobility status;Precautions  OT Assessment  OT Recommendation/Assessment Patient needs continued OT Services  OT Visit Diagnosis Muscle weakness (generalized) (M62.81);Unsteadiness on feet (R26.81)  OT Problem List Decreased strength;Decreased range of motion;Decreased activity tolerance;Impaired balance (sitting and/or standing);Decreased coordination;Decreased safety awareness;Decreased cognition;Decreased knowledge of use of DME or AE;Decreased knowledge of precautions;Impaired UE functional use;Impaired sensation  OT Plan  OT Frequency (ACUTE ONLY) Min 3X/week  OT Treatment/Interventions (ACUTE ONLY) Self-care/ADL training;Therapeutic exercise;Neuromuscular education;Energy conservation;DME and/or AE  instruction;Manual therapy;Therapeutic activities;Cognitive remediation/compensation;Visual/perceptual remediation/compensation;Patient/family education;Balance training  AM-PAC OT "6 Clicks" Daily Activity Outcome Measure (Version 2)  Help from another person eating meals? 3  Help from another person taking care of personal grooming? 3  Help from another person toileting, which includes using toliet, bedpan, or urinal? 3  Help from another person bathing (including washing, rinsing, drying)? 2  Help from another person to put on and taking off regular upper body clothing? 2  Help from another person to put on and taking off regular lower body clothing? 2  6 Click Score 15  OT Recommendation  Recommendations for Other Services Rehab consult  Follow Up Recommendations CIR  OT Equipment Other (comment) (defer to CIR)  Individuals Consulted  Consulted and Agree with Results and Recommendations Family member/caregiver;Patient  Family Member Consulted daughter present  Acute Rehab OT Goals  Patient Stated Goal to get my L arm back  OT Goal Formulation With patient/family  Time For Goal Achievement 01/14/19  Potential to Achieve Goals Good  OT Time Calculation  OT Start Time (ACUTE ONLY) 1139  OT Stop Time (ACUTE ONLY) 1214  OT Time Calculation (min) 35 min  OT General Charges  $OT Visit 1 Visit  OT Evaluation  $OT Eval Moderate Complexity 1 Mod  OT Treatments  $Self Care/Home Management  8-22 mins  Written Expression  Dominant Hand Right

## 2018-12-31 NOTE — Progress Notes (Addendum)
STROKE TEAM PROGRESS NOTE   INTERVAL HISTORY Pt daughter, OT and CIR coordinator are all at bedside. Pt sitting in chair, awake alert and talkative, still has left UE weakness. Dr. Carlis Abbott from [facility] recommend on CEA at this time. Pending CIR.   Vitals:   12/30/18 1539 12/30/18 2013 12/31/18 0414 12/31/18 0744  BP: (!) 154/74 (!) 117/57 (!) 120/50 (!) 148/62  Pulse: 85 90  75  Resp: 14 18 18 18   Temp: 97.8 F (36.6 C) 98.2 F (36.8 C) 98 F (36.7 C) 98.1 F (36.7 C)  TempSrc: Oral Oral Oral Oral  SpO2: 97% 95%  97%  Weight:      Height:        CBC:  Recent Labs  Lab 12/29/18 1541  WBC 6.0  NEUTROABS 3.8  HGB 11.9*  HCT 37.4  MCV 93.7  PLT 650    Basic Metabolic Panel:  Recent Labs  Lab 12/29/18 1541  NA 134*  K 4.5  CL 100  CO2 23  GLUCOSE 595*  BUN 23  CREATININE 0.98  CALCIUM 9.1   Lipid Panel:     Component Value Date/Time   CHOL 171 12/24/2018 0443   TRIG 75 12/24/2018 0443   HDL 59 12/24/2018 0443   CHOLHDL 2.9 12/24/2018 0443   VLDL 15 12/24/2018 0443   LDLCALC 97 12/24/2018 0443   HgbA1c:  Lab Results  Component Value Date   HGBA1C 10.3 (H) 12/23/2018   Urine Drug Screen: No results found for: LABOPIA, COCAINSCRNUR, LABBENZ, AMPHETMU, THCU, LABBARB  Alcohol Level No results found for: [facility]  IMAGING Ct Angio Head W Or Wo Contrast  Addendum Date: 12/29/2018   ADDENDUM REPORT: 12/29/2018 23:52 ADDENDUM: Study discussed by telephone with Dr. Jani Gravel on 12/29/2018 at 2337 hours. Electronically Signed   By: Genevie Ann M.D.   On: 12/29/2018 23:52   Result Date: 12/29/2018 CLINICAL DATA:  85 year old female with acute, subacute, and chronic right MCA territory infarcts. 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 EXAM: CT ANGIOGRAPHY HEAD AND NECK TECHNIQUE: Multidetector CT imaging of the head and neck was performed using the standard protocol during bolus administration of intravenous contrast. Multiplanar CT image reconstructions and MIPs were  obtained to evaluate the vascular anatomy. Carotid stenosis measurements (when applicable) are obtained utilizing NASCET criteria, using the distal internal carotid diameter as the denominator. CONTRAST:  114mL OMNIPAQUE IOHEXOL 350 MG/ML SOLN COMPARISON:  Brain MRI 1305 hours today and earlier. Neck MRA 05/03/2018. FINDINGS: CT HEAD Brain: Patchy and confluent hypodensity in the right MCA territory corresponding to the various ischemia. No associated hemorrhage. No associated mass effect. No ventriculomegaly. Stable gray-white matter differentiation elsewhere since 12/23/2018. Left anterior paraspinal seen meningioma measuring about 2.7 centimeters diameter redemonstrated on series 5, image 9. Calvarium and skull base: No acute osseous abnormality identified. Paranasal sinuses: Visualized paranasal sinuses and mastoids are stable and well pneumatized. Orbits: No acute orbit or scalp soft tissue findings. CTA NECK Skeleton: Severe cervical spine degeneration superimposed on congenital appearing incomplete segmentation of C5-C6. Partially visible thoracic scoliosis and right shoulder arthroplasty. No acute osseous abnormality identified. Upper chest: Negative. Other neck: Negative. Aortic arch: Mild to moderate arch atherosclerosis. Three vessel arch configuration. Right carotid system: No brachiocephalic or right CCA origin stenosis. Mild soft and calcified plaque in the right CCA proximal to the bifurcation. There is confluent calcification of the right ICA origin resulting in near occlusion of the vessel, string sign type reconstitution occurs at the bulb (  series 13, image 65), and the vessel is patent but highly diminutive and thread-like to the skull base. See series 9, image 65. Left carotid system: No left CCA origin stenosis and minimal plaque proximal to the left carotid bifurcation. The left ICA origin is also heavily calcified with string sign stenosis. The distal bulb is diminutive, only 2-3 millimeters  diameter. However, the remaining cervical left ICA has a more normal caliber and is patent to the skull base without additional stenosis. Vertebral arteries: No proximal right subclavian artery stenosis despite plaque. The right vertebral artery is non dominant. Calcified plaque near the right vertebral origin results in no significant stenosis. The right vertebral is patent to the skull base without stenosis. No proximal left subclavian artery stenosis despite soft and calcified plaque. Dominant left vertebral artery with a normal origin. The left vertebral is patent to the skull base without stenosis. CTA HEAD Posterior circulation: Mildly dominant left V4 segment. No distal vertebral stenosis. Normal right PICA origin. The left AICA appears dominant and patent. Patent vertebrobasilar junction and basilar artery without stenosis. Patent AICA and SCA origins. Patent right PCA origin with mild irregularity but no significant stenosis. There is a fetal type left PCA. The right posterior communicating artery is diminutive or absent. Left PCA branches are within normal limits. There is additional moderate stenosis in the right PCA P2 segment, but the distal right PCA is within normal limits. Anterior circulation: Thread-like enhancement of the right ICA at the skull base is noted but the right siphon is occluded in the distal petrous segment. Enhancement is reconstituted at the distal cavernous segment and the supraclinoid right ICA is patent although with asymmetrically decreased enhancement. The right ICA terminus is patent. The right A1 and M1 are patent but diminutive (series 15, image 16). The right MCA bifurcation is patent. The right MCA branches are patent but diminutive (series 14, image 19). The left ICA siphon is patent without stenosis despite some calcified plaque. Normal left posterior communicating artery origin. Normal left ICA terminus, left ACA origin. An anterior communicating artery is identified,  although the left ACA remains larger than the right throughout. The ACA branches are within normal limits except for some mass effect related to the para falcine meningioma. The left MCA origin is normal with mild irregularity and stenosis in the left M1. The left MCA bifurcation is patent without stenosis. Left MCA branches are within normal limits. Venous sinuses: Patent. Anatomic variants: Dominant left vertebral artery. Review of the MIP images confirms the above findings IMPRESSION: 1. 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 - the Right ICA is thread-like distal to the severe stenosis, and occludes in the mid right siphon. - there is suboptimal reconstitution of the distal right ICA (see #2) and the right MCA and right ACA are patent but diminutive. - the left ICA maintains a more normal caliber distal to the severe stenosis. And the left anterior circulation is largely normal. 2. There is no vertebral or basilar artery stenosis, but the Right Pcomm is diminutive or absent. There is up to moderate right PCA stenosis. 3. Expected CT appearance of right MCA ischemia. No intracranial hemorrhage or mass effect. 4. Chronic left para falcine meningioma. 5. 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 Electronically Signed: By: Genevie Ann M.D. On: 12/29/2018 23:29   Ct Angio Neck W Or Wo Contrast  Addendum Date: 12/29/2018   ADDENDUM REPORT: 12/29/2018 23:52 ADDENDUM: Crist Fat  discussed by telephone with Dr. Jani Gravel on 12/29/2018 at 2337 hours. Electronically Signed   By: Genevie Ann M.D.   On: 12/29/2018 23:52   Result Date: 12/29/2018 CLINICAL DATA:  85 year old female with acute, subacute, and chronic right MCA territory infarcts. 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 EXAM: CT ANGIOGRAPHY HEAD AND NECK TECHNIQUE: Multidetector CT imaging of the head and neck was performed using the  standard protocol during bolus administration of intravenous contrast. Multiplanar CT image reconstructions and MIPs were obtained to evaluate the vascular anatomy. Carotid stenosis measurements (when applicable) are obtained utilizing NASCET criteria, using the distal internal carotid diameter as the denominator. CONTRAST:  185mL OMNIPAQUE IOHEXOL 350 MG/ML SOLN COMPARISON:  Brain MRI 1305 hours today and earlier. Neck MRA 05/03/2018. FINDINGS: CT HEAD Brain: Patchy and confluent hypodensity in the right MCA territory corresponding to the various ischemia. No associated hemorrhage. No associated mass effect. No ventriculomegaly. Stable gray-white matter differentiation elsewhere since 12/23/2018. Left anterior paraspinal seen meningioma measuring about 2.7 centimeters diameter redemonstrated on series 5, image 9. Calvarium and skull base: No acute osseous abnormality identified. Paranasal sinuses: Visualized paranasal sinuses and mastoids are stable and well pneumatized. Orbits: No acute orbit or scalp soft tissue findings. CTA NECK Skeleton: Severe cervical spine degeneration superimposed on congenital appearing incomplete segmentation of C5-C6. Partially visible thoracic scoliosis and right shoulder arthroplasty. No acute osseous abnormality identified. Upper chest: Negative. Other neck: Negative. Aortic arch: Mild to moderate arch atherosclerosis. Three vessel arch configuration. Right carotid system: No brachiocephalic or right CCA origin stenosis. Mild soft and calcified plaque in the right CCA proximal to the bifurcation. There is confluent calcification of the right ICA origin resulting in near occlusion of the vessel, string sign type reconstitution occurs at the bulb (series 13, image 65), and the vessel is patent but highly diminutive and thread-like to the skull base. See series 9, image 65. Left carotid system: No left CCA origin stenosis and minimal plaque proximal to the left carotid bifurcation. The  left ICA origin is also heavily calcified with string sign stenosis. The distal bulb is diminutive, only 2-3 millimeters diameter. However, the remaining cervical left ICA has a more normal caliber and is patent to the skull base without additional stenosis. Vertebral arteries: No proximal right subclavian artery stenosis despite plaque. The right vertebral artery is non dominant. Calcified plaque near the right vertebral origin results in no significant stenosis. The right vertebral is patent to the skull base without stenosis. No proximal left subclavian artery stenosis despite soft and calcified plaque. Dominant left vertebral artery with a normal origin. The left vertebral is patent to the skull base without stenosis. CTA HEAD Posterior circulation: Mildly dominant left V4 segment. No distal vertebral stenosis. Normal right PICA origin. The left AICA appears dominant and patent. Patent vertebrobasilar junction and basilar artery without stenosis. Patent AICA and SCA origins. Patent right PCA origin with mild irregularity but no significant stenosis. There is a fetal type left PCA. The right posterior communicating artery is diminutive or absent. Left PCA branches are within normal limits. There is additional moderate stenosis in the right PCA P2 segment, but the distal right PCA is within normal limits. Anterior circulation: Thread-like enhancement of the right ICA at the skull base is noted but the right siphon is occluded in the distal petrous segment. Enhancement is reconstituted at the distal cavernous segment and the supraclinoid right ICA is patent although with asymmetrically decreased enhancement. The right ICA terminus  is patent. The right A1 and M1 are patent but diminutive (series 15, image 16). The right MCA bifurcation is patent. The right MCA branches are patent but diminutive (series 14, image 19). The left ICA siphon is patent without stenosis despite some calcified plaque. Normal left posterior  communicating artery origin. Normal left ICA terminus, left ACA origin. An anterior communicating artery is identified, although the left ACA remains larger than the right throughout. The ACA branches are within normal limits except for some mass effect related to the para falcine meningioma. The left MCA origin is normal with mild irregularity and stenosis in the left M1. The left MCA bifurcation is patent without stenosis. Left MCA branches are within normal limits. Venous sinuses: Patent. Anatomic variants: Dominant left vertebral artery. Review of the MIP images confirms the above findings IMPRESSION: 1. 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 - the Right ICA is thread-like distal to the severe stenosis, and occludes in the mid right siphon. - there is suboptimal reconstitution of the distal right ICA (see #2) and the right MCA and right ACA are patent but diminutive. - the left ICA maintains a more normal caliber distal to the severe stenosis. And the left anterior circulation is largely normal. 2. There is no vertebral or basilar artery stenosis, but the Right Pcomm is diminutive or absent. There is up to moderate right PCA stenosis. 3. Expected CT appearance of right MCA ischemia. No intracranial hemorrhage or mass effect. 4. Chronic left para falcine meningioma. 5. 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 Electronically Signed: By: Genevie Ann M.D. On: 12/29/2018 23:29   Mr Brain Wo Contrast  Result Date: 12/29/2018 CLINICAL DATA:  Leg weakness with difficulty walking. Recent stroke. EXAM: MRI HEAD WITHOUT CONTRAST TECHNIQUE: Multiplanar, multiecho pulse sequences of the brain and surrounding structures were obtained without intravenous contrast. COMPARISON:  12/23/2018 FINDINGS: Brain: Many of the small acute right MCA infarcts on the recent prior MRI demonstrate diminished diffusion  signal abnormality. However, there are multiple new acute right MCA infarcts involving frontal and parietal cortex which are overall larger than those on the prior study including a 3 cm infarct in the right precentral gyrus in the hand motor region which extends inferiorly into the centrum semiovale. There is no associated hemorrhage. A chronic right MCA infarct is again noted involving the insula, basal ganglia, and frontal lobe with hemosiderin staining and ex vacuo dilatation of the right frontal horn. An extra-axial left frontal mass anteriorly along the falx is unchanged, measuring 2.1 x 2.8 x 3.6 cm with local mass effect but no significant edema. There is no midline shift or extra-axial fluid collection. Mild generalized cerebral atrophy is not greater than expected for age. Vascular: Chronic right ICA occlusion. Skull and upper cervical spine: Unremarkable bone marrow signal. Sinuses/Orbits: Bilateral cataract extraction. Paranasal sinuses and mastoid air cells are clear. Other: None. IMPRESSION: 1. Multiple new acute right MCA infarcts involving the frontal and parietal lobes. 2. Subacute and chronic infarcts as above. 3. Unchanged left para falcine meningioma. 4. Chronic occlusion of the right ICA. Electronically Signed   By: Logan Bores M.D.   On: 12/29/2018 17:00   Mr Cervical Spine W Wo Contrast  Result Date: 12/29/2018 CLINICAL DATA:  Left upper extremity weakness. EXAM: MRI CERVICAL SPINE WITHOUT AND WITH CONTRAST TECHNIQUE: Multiplanar and multiecho pulse sequences of the cervical spine, to include the craniocervical junction and cervicothoracic junction, were obtained without  and with intravenous contrast. CONTRAST:  6 cc Gadavist COMPARISON:  Cervical spine CT scan from 02/05/2017 FINDINGS: Alignment: Severe degenerative cervical spondylosis with multilevel disc disease and facet disease. Multilevel degenerative subluxations are noted. 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 Vertebrae: No bone  lesions or fractures. Cord: Normal MR appearance of the cervical spinal cord. No cord lesions or syrinx. No areas of abnormal cord enhancement. Posterior Fossa, vertebral arteries, paraspinal tissues: No significant findings. Disc levels: C2-3: No significant findings. C3-4: Degenerative disc disease and facet disease with a bulging annulus and osteophytic ridging. No significant spinal stenosis. Left-sided disc osteophyte complex with mild left foraminal stenosis. C4-5: Advanced degenerative disc disease with a bulging degenerated annulus, osteophytic ridging and uncinate spurring. There is flattening of the ventral thecal sac and narrowing the ventral CSF space. No significant foraminal stenosis. C5-6: Klippel-Feil anomaly with partial fusion. No spinal or foraminal stenosis. C6-7: Advanced degenerative disc disease with a bulging degenerated annulus, osteophytic ridging and uncinate spurring. There is flattening of the ventral thecal sac and near obliteration of the ventral CSF space. Mild foraminal narrowing bilaterally due to uncinate spurring. C7-T1: Bulging annulus and facet disease but no disc protrusions or spinal stenosis. Mild bilateral foraminal stenosis. IMPRESSION: 1. Advanced degenerative cervical spondylosis with multilevel disc disease and facet disease. 2. 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 3. Normal MR appearance of the cervical spinal cord. 4. Mild spinal and bilateral foraminal stenosis at C6-7. 5. Mild left foraminal stenosis at C3-4. 6. Mild bilateral foraminal stenosis at C7-T1. Electronically Signed   By: Marijo Sanes M.D.   On: 12/29/2018 16:51   Vas US Carotid  Result Date: 12/30/2018 Carotid Arterial Duplex Study Indications:       Bilateral ICA stenosis / string sign seen on CTA neck. Risk Factors:      Hypertension, hyperlipidemia, Diabetes. Comparison Study:  Carotid artery duplex 12/23/2018, CTA head and neck 12/29/2018. Performing Technologist: Maudry Mayhew MHA, RDMS, RVT, RDCS   Examination Guidelines: A complete evaluation includes B-mode imaging, spectral Doppler, color Doppler, and power Doppler as needed of all accessible portions of each vessel. Bilateral testing is considered an integral part of a complete examination. Limited examinations for reoccurring indications may be performed as noted.  Right Carotid Findings: +----------+--------+-------+--------+------------------------+----------------+           PSV cm/sEDV    StenosisDescribe                Comments                           cm/s                                                    +----------+--------+-------+--------+------------------------+----------------+ CCA Prox  28                     smooth and heterogenous                  +----------+--------+-------+--------+------------------------+----------------+ CCA Distal30      4              heterogenous and  calcific                                 +----------+--------+-------+--------+------------------------+----------------+ ICA Prox  130     14             calcific                String sign      +----------+--------+-------+--------+------------------------+----------------+ ICA Distal14                                             Thumped waveform +----------+--------+-------+--------+------------------------+----------------+ ECA       380     24                                                      +----------+--------+-------+--------+------------------------+----------------+ +----------+--------+-------+----------------+-------------------+           PSV cm/sEDV cmsDescribe        Arm Pressure (mmHG) +----------+--------+-------+----------------+-------------------+ [id number]             Multiphasic, WNL                    +----------+--------+-------+----------------+-------------------+ +---------+--------+--+--------+--+---------+  VertebralPSV cm/s85EDV cm/s11Antegrade +---------+--------+--+--------+--+---------+  Left Carotid Findings: +----------+--------+--------+--------+-----------------------+--------+           PSV cm/sEDV cm/sStenosisDescribe               Comments +----------+--------+--------+--------+-----------------------+--------+ CCA Prox  73      13                                              +----------+--------+--------+--------+-----------------------+--------+ CCA Distal69      12              smooth and heterogenous         +----------+--------+--------+--------+-----------------------+--------+ ICA Prox  263     56              calcific                        +----------+--------+--------+--------+-----------------------+--------+ ICA Distal145     29                                              +----------+--------+--------+--------+-----------------------+--------+ ECA       172                     calcific                        +----------+--------+--------+--------+-----------------------+--------+ +----------+--------+--------+----------------+-------------------+ SubclavianPSV cm/sEDV cm/sDescribe        Arm Pressure (mmHG) +----------+--------+--------+----------------+-------------------+           171             Multiphasic, WNL                    +----------+--------+--------+----------------+-------------------+ +---------+--------+--+--------+-+---------+ VertebralPSV cm/s47EDV  cm/s9Antegrade +---------+--------+--+--------+-+---------+  Summary: Right Carotid: Sonographic string sign noted at origin of right internal carotid                artery, with thumped waveform of distal ICA, suggestive of distal                occlusion. This is consistent with CTA head and neck findings. Left Carotid: Elevated peak systolic velocities are suggestive of 60-79%               stenosis, however end diastolic velocities suggest upper range                40-59% stenosis. However, posterior acoustic shadowing secondary               to calcific plaque at bulb may obscure higher velocities. Vertebrals:  Bilateral vertebral arteries demonstrate antegrade flow. Subclavians: Normal flow hemodynamics were seen in bilateral subclavian              arteries. *See table(s) above for measurements and observations.  Electronically signed by Monica Martinez MD on 12/30/2018 at 3:29:37 PM.    Final     PHYSICAL EXAM  Temp:  [97.7 F (36.5 C)-98.2 F (36.8 C)] 98.1 F (36.7 C) (08/14 0744) Pulse Rate:  [75-91] 75 (08/14 0744) Resp:  [14-18] 18 (08/14 0744) BP: (116-154)/(50-74) 148/62 (08/14 0744) SpO2:  [95 %-97 %] 97 % (08/14 0744)  General - Well nourished, well developed, in no apparent distress.  Ophthalmologic - fundi not visualized due to noncooperation.  Cardiovascular - Regular rate and rhythm.  Mental Status -  Level of arousal and orientation to time, place, and person were intact. Language including expression, naming, repetition, comprehension was assessed and found intact. Fund of Knowledge was assessed and was intact.  Cranial Nerves II - XII - II - Visual field intact OU. III, IV, VI - Extraocular movements intact. V - Facial sensation intact bilaterally. VII - slight left nasolabial fold flattening. VIII - Hearing & vestibular intact bilaterally. X - Palate elevates symmetrically. XI - Chin turning & shoulder shrug intact bilaterally. XII - Tongue protrusion intact.  Motor Strength - The patient's strength was normal in RUE and RLE, however, LUE 0/5 and LLE 5-/5.  Bulk was normal and fasciculations were absent.   Motor Tone - Muscle tone was assessed at the neck and appendages and was normal.  Reflexes - The patient's reflexes were symmetrical in all extremities and she had no pathological reflexes.  Sensory - Light touch, temperature/pinprick were assessed and were symmetrical.    Coordination - The patient had  normal movements in the right FTN with no ataxia or dysmetria.  Tremor was absent.  Gait and Station - deferred.   ASSESSMENT/PLAN Danielle [name] is a 85 y.o. female with history of hypothyroidism, hypertension, hyperlipidemia, DM2, stroke in 05/03/2018 and more recent admission 12/23/2018 for R MCA stroke with R carotid occlusion presenting to [facility] ED with worsening L sided weakness and ataxia found to have mult acute R MCA infarcts. CTA showed B ICA string signs. Transferred to [facility].   Stroke:   Recurrent R MCA infarcts in setting of R ICA occlusion and severe L ICA stenosis  MRI Mult new R MCA infarcts in frontal and parietal lobe. Subacute and chronic R MCA infarcts. Unchanged L para falcine meningioma. chronic R ICA occlusion.   CTA head & neck B string signs B ICAs. R PComm diminuative/absent. Mod R PCA stenosis. Chronic  L para falcine meningioma.  Carotid Doppler  R ICA string sign w/ distal occlusion. L 59-45% with end diastolic velocities suggestions 40-59%. B VA antegrade.  2D Echo 8/7 EF 60-65%. No source of embolus   LDL 97  HgbA1c 10.3  Lovenox 40 mg sq daily for VTE prophylaxis  aspirin 325 mg daily prior to admission, now on aspirin 325 mg daily and clopidogrel 75 mg daily. Continue DAPT as per [facility].   Therapy recommendations:  CIR   Disposition:  pending   History of Stroke  12/23/2018 - L arm weakness. Tele neuro consulted in ED. MRI showed small R MCA acute and chronic infarcts. MRA head right MCA decrease flow. EEG neg. CUS showed R ICA occluded, L ICA 50-69%. EF 60-65%, A1C 10.3 and LDL 97. Still with weakness at d/c, hp improving.  refused SNF. D/c home on ASA 325, lipitor and fish oil.  04/2018 - left arm weakness, confusion, and difficulty walking - R frontal and parietal lobe infarcts in setting of R ICA occlusion. MRA head and neck right ICA occlusion, left ICA 50% stenosis and b/l P2 severe stenosis. CUS right ICA occlusion and left 50-69%  stenosis. EF 65-70%. A1C 7.9 and LDL 163. D/c on aspirin and plavix. Statin added. [facility])  Carotid Stenosis, bilateral  R ICA occluded vs. String sign  L ICA 40-59% stenosis  VVS Dr. Carlis Abbott on board, recommend no surgery at this time but will follow up with pt as outpt to consider left CEA once appropriate  May consider cerebral angio to further evaluation before intervention  If right ICA not fully occluded on cerebral angio, right CEA can be considered  If left ICA supplies right MCA on angiogram, left CEA is recommended.  Hypertension  Stable . Keep BP 130-160 given severe B ICA stenosis  Hyperlipidemia  Home meds:  lipitor 40 and fish oil  Now on lipitor 80  LDL 97, goal < 70  Continue statin at discharge  Diabetes type II Uncontrolled  HgbA1c 10.3, goal < 7.0  CBGs  SSI  Close PCP follow up for better DM control  Other Stroke Risk Factors  Advanced age  Former Cigarette smoker, quit 29 rs ago  Other Active Problems  hypothryoidism   Chronic left parafalcine meningioma - stable  Hospital day # 2  Neurology will sign off. Please call with questions. Pt will follow up with stroke clinic Dr. Leonie Man at [facility] in about 4 weeks. Thanks for the consult.  Rosalin Hawking, MD PhD Stroke Neurology 12/31/2018 12:13 PM  To contact Stroke Continuity provider, please refer to [email or web address] After hours, contact General Neurology

## 2019-01-01 LAB — GLUCOSE, CAPILLARY
Glucose-Capillary: 143 mg/dL — ABNORMAL HIGH (ref 70–99)
Glucose-Capillary: 162 mg/dL — ABNORMAL HIGH (ref 70–99)
Glucose-Capillary: 293 mg/dL — ABNORMAL HIGH (ref 70–99)
Glucose-Capillary: 314 mg/dL — ABNORMAL HIGH (ref 70–99)
Glucose-Capillary: 363 mg/dL — ABNORMAL HIGH (ref 70–99)
Glucose-Capillary: 381 mg/dL — ABNORMAL HIGH (ref 70–99)
Glucose-Capillary: 421 mg/dL — ABNORMAL HIGH (ref 70–99)

## 2019-01-01 NOTE — Progress Notes (Signed)
Physical Therapy Treatment Patient Details Name: Danielle [name] MRN: [id number] DOB: March 22, 1932 Today's Date: 01/01/2019    History of Present Illness Patient is a 85 y/o female with history of CVA presented with left UE weakness to the ED. MRI was completed on 12/23/18 showing multiple small acute right MCA infarcts. She was discharged from hospital to home on 12/27/18. had difficulties getting out of car after discharge and slid to ground, son and father had to help get her up. Per husband, they went to the ER, she was seen and sent home. They were home yesterday and husband had increased difficulty transfering patient. MD called to follow up wtih patient and wanted her admitted to hospital.    PT Comments    Pt making steady progress towards physical therapy goals. Increased tone in LUE, worked on diagonal PNF patterns to break up. Requiring mod assist for transfers, ambulating 15 ft x 3 using right wall railing. Tactile facilitation provided for knee flexion and to decrease hyperextension. Remains motivated with good activity tolerance. Highly recommending CIR to promote continued neurorecovery.     Follow Up Recommendations  CIR;Supervision/Assistance - 24 hour     Equipment Recommendations  Other (comment)(quad cane)    Recommendations for Other Services       Precautions / Restrictions Precautions Precautions: Fall Precaution Comments: LUE neglect, hemiparesis Restrictions Weight Bearing Restrictions: No    Mobility  Bed Mobility Overal bed mobility: Needs Assistance Bed Mobility: Sit to Supine;Supine to Sit     Supine to sit: Mod assist Sit to supine: Mod assist   General bed mobility comments: ModA for scooting hips forward with bed pad to seated on edge of bed, modA for return to bed to assist with BLE negotiation back into bed  Transfers Overall transfer level: Needs assistance Equipment used: 1 person hand held assist Transfers: Sit to/from [employer]  Transfers Sit to Stand: Mod assist Stand pivot transfers: Mod assist       General transfer comment: ModA to stand with left knee blocked and pivot towards left and right. Difficulty sequencing and initiating steps  Ambulation/Gait Ambulation/Gait assistance: Min assist Gait Distance (Feet): 15 Feet(x3) Assistive device: (R railing) Gait Pattern/deviations: Step-to pattern;Decreased weight shift to left;Trunk flexed;Decreased step length - right Gait velocity: decreased   General Gait Details: MinA for gait, holding onto R railing. Tactile facilitation provided for left knee flexion during swing phase and to decrease hyperextension during stance phase. Cues for upright, decreased left lateral lean, heel strike at initial contact. Decreased control of LLE, chair follow utilized   Marine scientist Rankin (Stroke Patients Only) Modified Rankin (Stroke Patients Only) Pre-Morbid Rankin Score: Moderate disability Modified Rankin: Moderately severe disability     Balance Overall balance assessment: Needs assistance Sitting-balance support: Feet supported;Single extremity supported Sitting balance-Leahy Scale: Fair Sitting balance - Comments: Right lateral lean able to self correct with cues    Standing balance support: Single extremity supported;During functional activity Standing balance-Leahy Scale: Fair                              Cognition Arousal/Alertness: Awake/alert Behavior During Therapy: WFL for tasks assessed/performed Overall Cognitive Status: Impaired/Different from baseline Area of Impairment: Awareness;Safety/judgement  Safety/Judgement: Decreased awareness of safety;Decreased awareness of deficits Awareness: Emergent          Exercises Other Exercises Other Exercises: PROM D1/D2 LUE Other Exercises: Lateral leans onto left elbow to promote weightbearing    General  Comments        Pertinent Vitals/Pain Pain Assessment: Faces Faces Pain Scale: Hurts a little bit Pain Location: left shoulder Pain Descriptors / Indicators: Guarding;Grimacing;Sore Pain Intervention(s): Monitored during session    Home Living                      Prior Function            PT Goals (current goals can now be found in the care plan section) Acute Rehab PT Goals Patient Stated Goal: to get my L arm back Potential to Achieve Goals: Good Progress towards PT goals: Progressing toward goals    Frequency    Min 4X/week      PT Plan Frequency needs to be updated    Co-evaluation              AM-PAC PT "6 Clicks" Mobility   Outcome Measure  Help needed turning from your back to your side while in a flat bed without using bedrails?: A Little Help needed moving from lying on your back to sitting on the side of a flat bed without using bedrails?: A Lot Help needed moving to and from a bed to a chair (including a wheelchair)?: A Lot Help needed standing up from a chair using your arms (e.g., wheelchair or bedside chair)?: A Little Help needed to walk in hospital room?: A Lot Help needed climbing 3-5 steps with a railing? : Total 6 Click Score: 13    End of Session Equipment Utilized During Treatment: Gait belt Activity Tolerance: Patient tolerated treatment well Patient left: in chair;with call bell/phone within reach;with chair alarm set;with family/visitor present Nurse Communication: Mobility status PT Visit Diagnosis: Unsteadiness on feet (R26.81);Other abnormalities of gait and mobility (R26.89);Muscle weakness (generalized) (M62.81)     Time: 2122-4825 PT Time Calculation (min) (ACUTE ONLY): 35 min  Charges:  $Gait Training: 8-22 mins $Therapeutic Exercise: 8-22 mins                     Ellamae Sia, PT, DPT Acute Rehabilitation Services Pager [phone] Office [phone]    Willy Eddy 01/01/2019, 5:06 PM

## 2019-01-01 NOTE — Progress Notes (Signed)
PROGRESS NOTE    Danielle [name]  DPO:[id number] DOB: 1931/11/20 DOA: 12/29/2018 PCP: Sinda Du, MD   Brief Narrative:  85 year old female with history of hypertension, hyperlipidemia, diabetes mellitus, hypothyroidism, meningioma of left falx, CVA 05/03/2018, 12/23/2018 for right MCA stroke with a right carotid occlusion presented to primary care physician's office with the worsening of the left-sided weakness, ataxia.  CT of the neck showed positive for bilateral radiographic string sign stenosis of both ICA origins due to bulky calcification.  The right MCA is threadlike distal to the severe stenosis and occludes in the mid right siphon.  There is a suboptimal reconstitution of the distal right ICA and the right MCA and right ACA are patent but diminutive.  The left ICA maintains in normal caliber distal to the severe stenosis.  No vertebral artery or basilar artery stenosis.  MRI of the brain showed multiple new acute right MCA infarcts involving the frontal and parietal lobes.  Subacute and chronic infarcts of the insula, basal ganglia and frontal lobe with the hemosiderin staining and ex vacuo dilatation of the right frontal horn.  Neurology is consulted, recommended cerebral angiogram to further evaluate.  Continue with aspirin, Lipitor, Plavix.  Assessment & Plan:  ##CVA with the extension of the strokes from 12/23/2018 -Patient has worsening of the weakness in the left arm and left lower extremity. -Continue with aspirin, Plavix, statin -MRI of the brain showed multiple new acute right MCA infarcts, CT angiogram shows bilateral string sign with severe stenosis -Neurology stroke team is following with the patient, plan to do angiogram of the carotids on 12/30/2018 and possible intervention -Continue with physical therapy, Occupational Therapy, speech therapy evaluation commended inpatient rehab.   -CIR working on authorization  ##Bilateral carotid artery stenosis -Consulted vascular  surgery, recommended patient to follow-up as an outpatient once patient recovers from recent stroke or possible carotid endarterectomy  ##Hypertension -Permissible hypertension  ##Hyperlipidemia -Continue with the Lipitor  ##Diabetes mellitus controlled -Last hemoglobin A1c was 10.3 -Keep the patient on Lantus 10 units daily -Follow-up on sliding scale insulin  ##Hypothyroidism -Continue with the Synthroid     Principal Problem:   Carotid stenosis, bilateral Active Problems:   Stroke ([place])     DVT prophylaxis: Lovenox  code Status: Full  family Communication: No family member present at bedside  disposition Plan: Home with home health   Consultants:   Neurology  Procedures:   Antimicrobials: None  Subjective: Patient is sitting in the chair, eating breakfast.  Denied having any headache. Objective: Vitals:   01/01/19 0023 01/01/19 0408 01/01/19 0811 01/01/19 1114  BP: (!) 126/55  (!) 153/69 (!) 119/58  Pulse: 82 78 77 74  Resp: 20  10 16   Temp: 98.4 F (36.9 C) 97.9 F (36.6 C) 97.8 F (36.6 C) 97.7 F (36.5 C)  TempSrc: Oral Oral Oral Oral  SpO2: 97%  98% 97%  Weight:      Height:        Intake/Output Summary (Last 24 hours) at 01/01/2019 1254 Last data filed at 12/31/2018 1330 Gross per 24 hour  Intake 240 ml  Output -  Net 240 ml   Filed Weights   12/29/18 1204 12/30/18 0303  Weight: 60 kg 60.1 kg    Examination:  General exam: Appears calm and comfortable  Respiratory system: Clear to auscultation. Respiratory effort normal. Cardiovascular system: S1 & S2 heard, RRR. No JVD, murmurs, rubs, gallops or clicks. No pedal edema. Gastrointestinal system: Abdomen is nondistended, soft and nontender. No  organomegaly or masses felt. Normal bowel sounds heard. Central nervous system: Alert and oriented.  Left upper extremity 0/5, left lower extremity 1/5, right upper and lower extremities 5/5.  Mild dysarthria Skin: No rashes, lesions or ulcers  Psychiatry: Judgement and insight appear normal. Mood & affect appropriate.     Data Reviewed: I have personally reviewed following labs and imaging studies  CBC: Recent Labs  Lab 12/29/18 1541  WBC 6.0  NEUTROABS 3.8  HGB 11.9*  HCT 37.4  MCV 93.7  PLT 458   Basic Metabolic Panel: Recent Labs  Lab 12/29/18 1541  NA 134*  K 4.5  CL 100  CO2 23  GLUCOSE 595*  BUN 23  CREATININE 0.98  CALCIUM 9.1   GFR: Estimated Creatinine Clearance: 38.5 mL/min (by C-G formula based on SCr of 0.98 mg/dL). Liver Function Tests: Recent Labs  Lab 12/29/18 1541  AST 51*  ALT 46*  ALKPHOS 117  BILITOT 0.8  PROT 6.4*  ALBUMIN 3.4*   No results for input(s): LIPASE, AMYLASE in the last 168 hours. No results for input(s): AMMONIA in the last 168 hours. Coagulation Profile: No results for input(s): INR, PROTIME in the last 168 hours. Cardiac Enzymes: No results for input(s): CKTOTAL, CKMB, CKMBINDEX, TROPONINI in the last 168 hours. BNP (last 3 results) No results for input(s): PROBNP in the last 8760 hours. HbA1C: No results for input(s): HGBA1C in the last 72 hours. CBG: Recent Labs  Lab 12/31/18 1955 01/01/19 0001 01/01/19 0406 01/01/19 0810 01/01/19 1111  GLUCAP 412* 162* 143* 314* 421*   Lipid Profile: No results for input(s): CHOL, HDL, LDLCALC, TRIG, CHOLHDL, LDLDIRECT in the last 72 hours. Thyroid Function Tests: Recent Labs    12/29/18 1541  TSH 0.122*   Anemia Panel: No results for input(s): VITAMINB12, FOLATE, FERRITIN, TIBC, IRON, RETICCTPCT in the last 72 hours. Sepsis Labs: No results for input(s): PROCALCITON, LATICACIDVEN in the last 168 hours.  Recent Results (from the past 240 hour(s))  SARS CORONAVIRUS 2 Nasal Swab Aptima Multi Swab     Status: None   Collection Time: 12/23/18  9:57 AM   Specimen: Aptima Multi Swab; Nasal Swab  Result Value Ref Range Status   SARS Coronavirus 2 NEGATIVE NEGATIVE Final    Comment: (NOTE) SARS-CoV-2 target  nucleic acids are NOT DETECTED. The SARS-CoV-2 RNA is generally detectable in upper and lower respiratory specimens during the acute phase of infection. Negative results do not preclude SARS-CoV-2 infection, do not rule out co-infections with other pathogens, and should not be used as the sole basis for treatment or other patient management decisions. Negative results must be combined with clinical observations, patient history, and epidemiological information. The expected result is Negative. Fact Sheet for Patients: SugarRoll.be Fact Sheet for Healthcare Providers: https://www.woods-mathews.com/ This test is not yet approved or cleared by the [place] FDA and  has been authorized for detection and/or diagnosis of SARS-CoV-2 by FDA under an Emergency Use Authorization (EUA). This EUA will remain  in effect (meaning this test can be used) for the duration of the COVID-19 declaration under Section 56 4(b)(1) of the Act, 21 U.S.C. section 360bbb-3(b)(1), unless the authorization is terminated or revoked sooner. Performed at [facility], [place] [address], [place], [place] [address]   SARS Coronavirus 2 [facility] order, Performed in [facility] hospital lab)     Status: None   Collection Time: 12/29/18  7:10 PM  Result Value Ref Range Status   SARS Coronavirus 2 NEGATIVE NEGATIVE Final  Comment: (NOTE) If result is NEGATIVE SARS-CoV-2 target nucleic acids are NOT DETECTED. The SARS-CoV-2 RNA is generally detectable in upper and lower  respiratory specimens during the acute phase of infection. The lowest  concentration of SARS-CoV-2 viral copies this assay can detect is 250  copies / mL. A negative result does not preclude SARS-CoV-2 infection  and should not be used as the sole basis for treatment or other  patient management decisions.  A negative result may occur with  improper specimen collection / handling, submission of  specimen other  than nasopharyngeal swab, presence of viral mutation(s) within the  areas targeted by this assay, and inadequate number of viral copies  (<250 copies / mL). A negative result must be combined with clinical  observations, patient history, and epidemiological information. If result is POSITIVE SARS-CoV-2 target nucleic acids are DETECTED. The SARS-CoV-2 RNA is generally detectable in upper and lower  respiratory specimens dur ing the acute phase of infection.  Positive  results are indicative of active infection with SARS-CoV-2.  Clinical  correlation with patient history and other diagnostic information is  necessary to determine patient infection status.  Positive results do  not rule out bacterial infection or co-infection with other viruses. If result is PRESUMPTIVE POSTIVE SARS-CoV-2 nucleic acids MAY BE PRESENT.   A presumptive positive result was obtained on the submitted specimen  and confirmed on repeat testing.  While 2019 novel coronavirus  (SARS-CoV-2) nucleic acids may be present in the submitted sample  additional confirmatory testing may be necessary for epidemiological  and / or clinical management purposes  to differentiate between  SARS-CoV-2 and other Sarbecovirus currently known to infect humans.  If clinically indicated additional testing with an alternate test  methodology [phone]) is advised. The SARS-CoV-2 RNA is generally  detectable in upper and lower respiratory sp ecimens during the acute  phase of infection. The expected result is Negative. Fact Sheet for Patients:  StrictlyIdeas.no Fact Sheet for Healthcare Providers: BankingDealers.co.za This test is not yet approved or cleared by the [place] FDA and has been authorized for detection and/or diagnosis of SARS-CoV-2 by FDA under an Emergency Use Authorization (EUA).  This EUA will remain in effect (meaning this test can be used) for the  duration of the COVID-19 declaration under Section 564(b)(1) of the Act, 21 U.S.C. section 360bbb-3(b)(1), unless the authorization is terminated or revoked sooner. Performed at [facility], [address], [place], [place] [address]          Radiology Studies: No results found.      Scheduled Meds: .  stroke: mapping our early stages of recovery book   Does not apply Once  . amLODipine  5 mg Oral Daily  . aspirin  325 mg Oral Daily  . atorvastatin  80 mg Oral q1800  . clopidogrel  75 mg Oral Daily  . enoxaparin (LOVENOX) injection  40 mg Subcutaneous Q24H  . insulin aspart  0-9 Units Subcutaneous Q4H  . levothyroxine  100 mcg Oral q morning - 10a  . multivitamin  1 tablet Oral Daily  . sodium chloride flush  3 mL Intravenous Q12H  . sodium chloride flush  3 mL Intravenous Q12H  . vitamin B-12  1,000 mcg Oral Daily   Continuous Infusions:   LOS: 3 days    Time spent: 35 minutes    Jasiri Hanawalt, MD [facility] Pager [phone]  If 7PM-7AM, please contact night-coverage [email or web address] Password [facility] 01/01/2019, 12:54 PM

## 2019-01-02 LAB — GLUCOSE, CAPILLARY
Glucose-Capillary: 107 mg/dL — ABNORMAL HIGH (ref 70–99)
Glucose-Capillary: 225 mg/dL — ABNORMAL HIGH (ref 70–99)
Glucose-Capillary: 236 mg/dL — ABNORMAL HIGH (ref 70–99)
Glucose-Capillary: 301 mg/dL — ABNORMAL HIGH (ref 70–99)
Glucose-Capillary: 327 mg/dL — ABNORMAL HIGH (ref 70–99)
Glucose-Capillary: 329 mg/dL — ABNORMAL HIGH (ref 70–99)
Glucose-Capillary: 406 mg/dL — ABNORMAL HIGH (ref 70–99)
Glucose-Capillary: 407 mg/dL — ABNORMAL HIGH (ref 70–99)

## 2019-01-02 MED ORDER — INSULIN GLARGINE 100 UNIT/ML ~~LOC~~ SOLN
15.0000 [IU] | Freq: Every day | SUBCUTANEOUS | Status: DC
  Administered 2019-01-02 – 2019-01-03 (×2): 15 [IU] via SUBCUTANEOUS
  Filled 2019-01-02 (×2): qty 0.15

## 2019-01-02 NOTE — Progress Notes (Signed)
PROGRESS NOTE    Danielle [name]  UUV:[id number] DOB: [birth year] DOA: 12/29/2018 PCP: Sinda Du, MD   Brief Narrative:  85 year old female with history of hypertension, hyperlipidemia, diabetes mellitus, hypothyroidism, meningioma of left falx, CVA 05/03/2018, 12/23/2018 for right MCA stroke with a right carotid occlusion presented to primary care physician's office with the worsening of the left-sided weakness, ataxia.  CT of the neck showed positive for bilateral radiographic string sign stenosis of both ICA origins due to bulky calcification.  The right MCA is threadlike distal to the severe stenosis and occludes in the mid right siphon.  There is a suboptimal reconstitution of the distal right ICA and the right MCA and right ACA are patent but diminutive.  The left ICA maintains in normal caliber distal to the severe stenosis.  No vertebral artery or basilar artery stenosis.  MRI of the brain showed multiple new acute right MCA infarcts involving the frontal and parietal lobes.  Subacute and chronic infarcts of the insula, basal ganglia and frontal lobe with the hemosiderin staining and ex vacuo dilatation of the right frontal horn.  Neurology is consulted, recommended cerebral angiogram to further evaluate.  Continue with aspirin, Lipitor, Plavix.  Assessment & Plan:  ##CVA with the extension of the strokes from 12/23/2018 -Patient has worsening of the weakness in the left arm and left lower extremity. -Continue with aspirin, Plavix, statin -MRI of the brain showed multiple new acute right MCA infarcts, CT angiogram shows bilateral string sign with severe stenosis -Neurology stroke team is following with the patient, plan to do angiogram of the carotids on 12/30/2018 and possible intervention -Continue with physical therapy, Occupational Therapy, speech therapy evaluation commended inpatient rehab.   -CIR working on authorization  ##Bilateral carotid artery stenosis -Consulted vascular  surgery, recommended patient to follow-up as an outpatient once patient recovers from recent stroke or possible carotid endarterectomy  ##Hypertension -Permissible hypertension  ##Hyperlipidemia -Continue with the Lipitor  ##Diabetes mellitus controlled -Last hemoglobin A1c was 10.3 -Increase Lantus 20 units daily -Follow-up on sliding scale insulin  ##Hypothyroidism -Continue with the Synthroid     Principal Problem:   Carotid stenosis, bilateral Active Problems:   Stroke ([place])     DVT prophylaxis: Lovenox  code Status: Full  family Communication: No family member present at bedside  disposition Plan: Home with home health   Consultants:   Neurology  Procedures:   Antimicrobials: None  Subjective: Denies having any complaints.  Trying to do exercises sitting in the chair by herself.  Well motivated Objective: Vitals:   01/02/19 0031 01/02/19 0403 01/02/19 0800 01/02/19 1115  BP: (!) 140/59 (!) 148/56 (!) 148/49 (!) 146/64  Pulse: 80 86 77 72  Resp: 16 16 13 19   Temp: 98 F (36.7 C) 98.1 F (36.7 C) 98.5 F (36.9 C) 97.6 F (36.4 C)  TempSrc: Oral Oral Oral Oral  SpO2: 95% 95% 98% 99%  Weight:      Height:        Intake/Output Summary (Last 24 hours) at 01/02/2019 1231 Last data filed at 01/02/2019 0834 Gross per 24 hour  Intake 840 ml  Output -  Net 840 ml   Filed Weights   12/29/18 1204 12/30/18 0303  Weight: 60 kg 60.1 kg    Examination:  General exam: Appears calm and comfortable  Respiratory system: Clear to auscultation. Respiratory effort normal. Cardiovascular system: S1 & S2 heard, RRR. No JVD, murmurs, rubs, gallops or clicks. No pedal edema. Gastrointestinal system: Abdomen is nondistended, soft  and nontender. No organomegaly or masses felt. Normal bowel sounds heard. Central nervous system: Alert and oriented.  Left upper extremity 0/5, left lower extremity 1/5, right upper and lower extremities 5/5.  Mild dysarthria Skin: No  rashes, lesions or ulcers Psychiatry: Judgement and insight appear normal. Mood & affect appropriate.     Data Reviewed: I have personally reviewed following labs and imaging studies  CBC: Recent Labs  Lab 12/29/18 1541  WBC 6.0  NEUTROABS 3.8  HGB 11.9*  HCT 37.4  MCV 93.7  PLT 109   Basic Metabolic Panel: Recent Labs  Lab 12/29/18 1541  NA 134*  K 4.5  CL 100  CO2 23  GLUCOSE 595*  BUN 23  CREATININE 0.98  CALCIUM 9.1   GFR: Estimated Creatinine Clearance: 38.5 mL/min (by C-G formula based on SCr of 0.98 mg/dL). Liver Function Tests: Recent Labs  Lab 12/29/18 1541  AST 51*  ALT 46*  ALKPHOS 117  BILITOT 0.8  PROT 6.4*  ALBUMIN 3.4*   No results for input(s): LIPASE, AMYLASE in the last 168 hours. No results for input(s): AMMONIA in the last 168 hours. Coagulation Profile: No results for input(s): INR, PROTIME in the last 168 hours. Cardiac Enzymes: No results for input(s): CKTOTAL, CKMB, CKMBINDEX, TROPONINI in the last 168 hours. BNP (last 3 results) No results for input(s): PROBNP in the last 8760 hours. HbA1C: No results for input(s): HGBA1C in the last 72 hours. CBG: Recent Labs  Lab 01/01/19 2013 01/02/19 0028 01/02/19 0400 01/02/19 0740 01/02/19 1113  GLUCAP 363* 301* 236* 225* 407*   Lipid Profile: No results for input(s): CHOL, HDL, LDLCALC, TRIG, CHOLHDL, LDLDIRECT in the last 72 hours. Thyroid Function Tests: No results for input(s): TSH, T4TOTAL, FREET4, T3FREE, THYROIDAB in the last 72 hours. Anemia Panel: No results for input(s): VITAMINB12, FOLATE, FERRITIN, TIBC, IRON, RETICCTPCT in the last 72 hours. Sepsis Labs: No results for input(s): PROCALCITON, LATICACIDVEN in the last 168 hours.  Recent Results (from the past 240 hour(s))  SARS Coronavirus 2 [facility] order, Performed in [facility] hospital lab)     Status: None   Collection Time: 12/29/18  7:10 PM  Result Value Ref Range Status   SARS Coronavirus 2 NEGATIVE  NEGATIVE Final    Comment: (NOTE) If result is NEGATIVE SARS-CoV-2 target nucleic acids are NOT DETECTED. The SARS-CoV-2 RNA is generally detectable in upper and lower  respiratory specimens during the acute phase of infection. The lowest  concentration of SARS-CoV-2 viral copies this assay can detect is 250  copies / mL. A negative result does not preclude SARS-CoV-2 infection  and should not be used as the sole basis for treatment or other  patient management decisions.  A negative result may occur with  improper specimen collection / handling, submission of specimen other  than nasopharyngeal swab, presence of viral mutation(s) within the  areas targeted by this assay, and inadequate number of viral copies  (<250 copies / mL). A negative result must be combined with clinical  observations, patient history, and epidemiological information. If result is POSITIVE SARS-CoV-2 target nucleic acids are DETECTED. The SARS-CoV-2 RNA is generally detectable in upper and lower  respiratory specimens dur ing the acute phase of infection.  Positive  results are indicative of active infection with SARS-CoV-2.  Clinical  correlation with patient history and other diagnostic information is  necessary to determine patient infection status.  Positive results do  not rule out bacterial infection or co-infection with other viruses. If result  is PRESUMPTIVE POSTIVE SARS-CoV-2 nucleic acids MAY BE PRESENT.   A presumptive positive result was obtained on the submitted specimen  and confirmed on repeat testing.  While 2019 novel coronavirus  (SARS-CoV-2) nucleic acids may be present in the submitted sample  additional confirmatory testing may be necessary for epidemiological  and / or clinical management purposes  to differentiate between  SARS-CoV-2 and other Sarbecovirus currently known to infect humans.  If clinically indicated additional testing with an alternate test  methodology [phone]) is  advised. The SARS-CoV-2 RNA is generally  detectable in upper and lower respiratory sp ecimens during the acute  phase of infection. The expected result is Negative. Fact Sheet for Patients:  StrictlyIdeas.no Fact Sheet for Healthcare Providers: BankingDealers.co.za This test is not yet approved or cleared by the [place] FDA and has been authorized for detection and/or diagnosis of SARS-CoV-2 by FDA under an Emergency Use Authorization (EUA).  This EUA will remain in effect (meaning this test can be used) for the duration of the COVID-19 declaration under Section 564(b)(1) of the Act, 21 U.S.C. section 360bbb-3(b)(1), unless the authorization is terminated or revoked sooner. Performed at [facility], [address], [place], [place] [address]          Radiology Studies: No results found.      Scheduled Meds: .  stroke: mapping our early stages of recovery book   Does not apply Once  . amLODipine  5 mg Oral Daily  . aspirin  325 mg Oral Daily  . atorvastatin  80 mg Oral q1800  . clopidogrel  75 mg Oral Daily  . enoxaparin (LOVENOX) injection  40 mg Subcutaneous Q24H  . insulin aspart  0-9 Units Subcutaneous Q4H  . levothyroxine  100 mcg Oral q morning - 10a  . multivitamin  1 tablet Oral Daily  . sodium chloride flush  3 mL Intravenous Q12H  . sodium chloride flush  3 mL Intravenous Q12H  . vitamin B-12  1,000 mcg Oral Daily   Continuous Infusions:   LOS: 4 days    Time spent: 35 minutes    Philis Doke, MD [facility] Pager [phone]  If 7PM-7AM, please contact night-coverage [email or web address] Password [facility] 01/02/2019, 12:31 PM

## 2019-01-03 ENCOUNTER — Other Ambulatory Visit: Payer: Self-pay

## 2019-01-03 ENCOUNTER — Inpatient Hospital Stay (HOSPITAL_COMMUNITY)
Admission: RE | Admit: 2019-01-03 | Discharge: 2019-01-21 | DRG: 057 | Disposition: A | Discharge status: Home Health | Payer: Medicare Other | Source: Intra-hospital | Attending: Physical Medicine & Rehabilitation | Admitting: Physical Medicine & Rehabilitation

## 2019-01-03 DIAGNOSIS — Z79890 Hormone replacement therapy (postmenopausal): Secondary | ICD-10-CM | POA: Diagnosis not present

## 2019-01-03 DIAGNOSIS — Z96611 Presence of right artificial shoulder joint: Secondary | ICD-10-CM | POA: Diagnosis present

## 2019-01-03 DIAGNOSIS — I6523 Occlusion and stenosis of bilateral carotid arteries: Secondary | ICD-10-CM | POA: Diagnosis present

## 2019-01-03 DIAGNOSIS — E039 Hypothyroidism, unspecified: Secondary | ICD-10-CM | POA: Diagnosis present

## 2019-01-03 DIAGNOSIS — Z87442 Personal history of urinary calculi: Secondary | ICD-10-CM | POA: Diagnosis not present

## 2019-01-03 DIAGNOSIS — I63511 Cerebral infarction due to unspecified occlusion or stenosis of right middle cerebral artery: Secondary | ICD-10-CM | POA: Diagnosis present

## 2019-01-03 DIAGNOSIS — Z79899 Other long term (current) drug therapy: Secondary | ICD-10-CM | POA: Diagnosis not present

## 2019-01-03 DIAGNOSIS — E1121 Type 2 diabetes mellitus with diabetic nephropathy: Secondary | ICD-10-CM | POA: Diagnosis present

## 2019-01-03 DIAGNOSIS — Z888 Allergy status to other drugs, medicaments and biological substances status: Secondary | ICD-10-CM | POA: Diagnosis not present

## 2019-01-03 DIAGNOSIS — I10 Essential (primary) hypertension: Secondary | ICD-10-CM | POA: Diagnosis present

## 2019-01-03 DIAGNOSIS — Z87891 Personal history of nicotine dependence: Secondary | ICD-10-CM | POA: Diagnosis not present

## 2019-01-03 DIAGNOSIS — G4700 Insomnia, unspecified: Secondary | ICD-10-CM | POA: Diagnosis present

## 2019-01-03 DIAGNOSIS — I69354 Hemiplegia and hemiparesis following cerebral infarction affecting left non-dominant side: Secondary | ICD-10-CM | POA: Diagnosis present

## 2019-01-03 DIAGNOSIS — E1165 Type 2 diabetes mellitus with hyperglycemia: Secondary | ICD-10-CM | POA: Diagnosis present

## 2019-01-03 DIAGNOSIS — I6522 Occlusion and stenosis of left carotid artery: Secondary | ICD-10-CM | POA: Diagnosis not present

## 2019-01-03 DIAGNOSIS — IMO0002 Reserved for concepts with insufficient information to code with codable children: Secondary | ICD-10-CM

## 2019-01-03 DIAGNOSIS — Z7982 Long term (current) use of aspirin: Secondary | ICD-10-CM

## 2019-01-03 DIAGNOSIS — D320 Benign neoplasm of cerebral meninges: Secondary | ICD-10-CM | POA: Diagnosis present

## 2019-01-03 DIAGNOSIS — E119 Type 2 diabetes mellitus without complications: Secondary | ICD-10-CM

## 2019-01-03 DIAGNOSIS — Z794 Long term (current) use of insulin: Secondary | ICD-10-CM

## 2019-01-03 DIAGNOSIS — K5900 Constipation, unspecified: Secondary | ICD-10-CM | POA: Diagnosis present

## 2019-01-03 DIAGNOSIS — E785 Hyperlipidemia, unspecified: Secondary | ICD-10-CM | POA: Diagnosis present

## 2019-01-03 HISTORY — DX: Cerebral infarction due to unspecified occlusion or stenosis of right middle cerebral artery: I63.511

## 2019-01-03 LAB — BASIC METABOLIC PANEL WITH GFR
Anion gap: 9 (ref 5–15)
BUN: 19 mg/dL (ref 8–23)
CO2: 23 mmol/L (ref 22–32)
Calcium: 9 mg/dL (ref 8.9–10.3)
Chloride: 106 mmol/L (ref 98–111)
Creatinine, Ser: 0.9 mg/dL (ref 0.44–1.00)
GFR calc Af Amer: >60 mL/min
GFR calc non Af Amer: 59 mL/min — ABNORMAL LOW
Glucose, Bld: 256 mg/dL — ABNORMAL HIGH (ref 70–99)
Potassium: 3.9 mmol/L (ref 3.5–5.1)
Sodium: 138 mmol/L (ref 135–145)

## 2019-01-03 LAB — CBC
HCT: 38.8 % (ref 36.0–46.0)
Hemoglobin: 12.5 g/dL (ref 12.0–15.0)
MCH: 29.7 pg (ref 26.0–34.0)
MCHC: 32.2 g/dL (ref 30.0–36.0)
MCV: 92.2 fL (ref 80.0–100.0)
Platelets: 309 10*3/uL (ref 150–400)
RBC: 4.21 MIL/uL (ref 3.87–5.11)
RDW: 14.7 % (ref 11.5–15.5)
WBC: 6.3 10*3/uL (ref 4.0–10.5)
nRBC: 0 % (ref 0.0–0.2)

## 2019-01-03 LAB — GLUCOSE, CAPILLARY
Glucose-Capillary: 212 mg/dL — ABNORMAL HIGH (ref 70–99)
Glucose-Capillary: 231 mg/dL — ABNORMAL HIGH (ref 70–99)
Glucose-Capillary: 391 mg/dL — ABNORMAL HIGH (ref 70–99)
Glucose-Capillary: 291 mg/dL — ABNORMAL HIGH (ref 70–99)

## 2019-01-03 LAB — CREATININE, SERUM
Creatinine, Ser: 1.06 mg/dL — ABNORMAL HIGH (ref 0.44–1.00)
GFR calc Af Amer: 56 mL/min — ABNORMAL LOW (ref >60)
GFR calc non Af Amer: 48 mL/min — ABNORMAL LOW (ref >60)

## 2019-01-03 MED ORDER — ACETAMINOPHEN 325 MG PO TABS
650.0000 mg | ORAL_TABLET | Freq: Four times a day (QID) | ORAL | Status: DC | PRN
  Administered 2019-01-10 – 2019-01-16 (×4): 650 mg via ORAL
  Filled 2019-01-03 (×4): qty 2

## 2019-01-03 MED ORDER — ACETAMINOPHEN 650 MG RE SUPP: 650.0000 mg | Freq: Four times a day (QID) | RECTAL | Status: DC | PRN

## 2019-01-03 MED ORDER — ATORVASTATIN CALCIUM 80 MG PO TABS: 80.0000 mg | ORAL_TABLET | Freq: Every day | ORAL | 0 refills | Status: DC

## 2019-01-03 MED ORDER — LEVOTHYROXINE SODIUM 100 MCG PO TABS
100.0000 ug | ORAL_TABLET | Freq: Every morning | ORAL | Status: DC
  Administered 2019-01-04 – 2019-01-21 (×18): 100 ug via ORAL
  Filled 2019-01-03 (×18): qty 1

## 2019-01-03 MED ORDER — CLOPIDOGREL BISULFATE 75 MG PO TABS
75.0000 mg | ORAL_TABLET | Freq: Every day | ORAL | Status: DC
  Administered 2019-01-04 – 2019-01-21 (×18): 75 mg via ORAL
  Filled 2019-01-03 (×18): qty 1

## 2019-01-03 MED ORDER — INSULIN GLARGINE 100 UNIT/ML ~~LOC~~ SOLN
15.0000 [IU] | Freq: Every day | SUBCUTANEOUS | Status: DC
  Administered 2019-01-04: 15 [IU] via SUBCUTANEOUS
  Filled 2019-01-03: qty 0.15

## 2019-01-03 MED ORDER — ENOXAPARIN SODIUM 40 MG/0.4ML ~~LOC~~ SOLN: 40.0000 mg | SUBCUTANEOUS | Status: DC

## 2019-01-03 MED ORDER — INSULIN ASPART 100 UNIT/ML ~~LOC~~ SOLN: 0.0000 [IU] | SUBCUTANEOUS | 11 refills | Status: DC

## 2019-01-03 MED ORDER — ENOXAPARIN SODIUM 40 MG/0.4ML ~~LOC~~ SOLN
40.0000 mg | SUBCUTANEOUS | Status: DC
  Administered 2019-01-04 – 2019-01-20 (×17): 40 mg via SUBCUTANEOUS
  Filled 2019-01-03 (×17): qty 0.4

## 2019-01-03 MED ORDER — SORBITOL 70 % SOLN
30.0000 mL | Freq: Every day | Status: DC | PRN
  Administered 2019-01-08 – 2019-01-20 (×4): 30 mL via ORAL
  Filled 2019-01-03 (×4): qty 30

## 2019-01-03 MED ORDER — ASPIRIN 325 MG PO TABS
325.0000 mg | ORAL_TABLET | Freq: Every day | ORAL | Status: DC
  Administered 2019-01-04 – 2019-01-21 (×18): 325 mg via ORAL
  Filled 2019-01-03 (×18): qty 1

## 2019-01-03 MED ORDER — PROSIGHT PO TABS
1.0000 | ORAL_TABLET | Freq: Every day | ORAL | Status: DC
  Administered 2019-01-04 – 2019-01-21 (×18): 1 via ORAL
  Filled 2019-01-03 (×18): qty 1

## 2019-01-03 MED ORDER — ATORVASTATIN CALCIUM 80 MG PO TABS
80.0000 mg | ORAL_TABLET | Freq: Every day | ORAL | Status: DC
  Administered 2019-01-04 – 2019-01-20 (×17): 80 mg via ORAL
  Filled 2019-01-03 (×17): qty 1

## 2019-01-03 MED ORDER — VITAMIN B-12 1000 MCG PO TABS
1000.0000 ug | ORAL_TABLET | Freq: Every day | ORAL | Status: DC
  Administered 2019-01-04 – 2019-01-21 (×18): 1000 ug via ORAL
  Filled 2019-01-03 (×18): qty 1

## 2019-01-03 MED ORDER — AMLODIPINE BESYLATE 5 MG PO TABS
5.0000 mg | ORAL_TABLET | Freq: Every day | ORAL | Status: DC
  Administered 2019-01-04 – 2019-01-21 (×18): 5 mg via ORAL
  Filled 2019-01-03 (×18): qty 1

## 2019-01-03 MED ORDER — INSULIN ASPART 100 UNIT/ML ~~LOC~~ SOLN
0.0000 [IU] | SUBCUTANEOUS | Status: DC
  Administered 2019-01-03: 3 [IU] via SUBCUTANEOUS
  Administered 2019-01-04: 1 [IU] via SUBCUTANEOUS
  Administered 2019-01-04 (×4): 5 [IU] via SUBCUTANEOUS
  Administered 2019-01-04: 3 [IU] via SUBCUTANEOUS

## 2019-01-03 NOTE — Progress Notes (Signed)
Meredith Staggers, MD  Physician  Physical Medicine and Rehabilitation  PMR Pre-admission  Signed  Date of Service:  01/03/2019 10:53 AM      Related encounter: Admission (Discharged) from 12/29/2018 in [place] all [x]Manual[x]Template[x]Copied  Added by: [x], Vertis Kelch, RN[x]Swartz, Celesta Gentile, MD  []Hover for details PMR Admission Coordinator Pre-Admission Assessment  Patient: Danielle [name] is an 85 y.o., female MRN: [id number] DOB: 1931/07/12 Height: 5' 5" (165.1 cm) Weight: 60.1 kg  Insurance Information HMO: yes    PPO:      PCP:      IPA:      80/20:      OTHER: medicare advantage plan PRIMARY: UHC Medicare      Policy#: [id number]      Subscriber: pt CM Name: Cristie Hem      Phone#: [phone] ext. Not given     Fax#: online Pre-Cert#: [id number] approved for 7 days with Princess is f/u phone [phone] ext [phone] fax [phone]      Employer:  Benefits:  Phone #: [phone]     Name: 8/15 Eff. Date: 05/19/2018     Deduct: none      Out of Pocket Max: $4500      Life Max: none CIR: $325 co pay per day days 1 until 5      SNF: no co pay days 1 until 20; $160 co pay per day days 21 until 49; no co pay days 50 until 100 Outpatient: $35 copy per visit     Co-Pay: visits per medical neccesity Home Health: 100%      Co-Pay: visits per medical neccesity DME: 80%     Co-Pay: 20% Providers: in network  Spouse to talk to his insurance man for he feels I am misquoting cost of CIR. His insurance man is quoting SNF for CIR. I explained that we are not SNF therefore cost as stated. He is na agreement to CIR admit despite his questions concerning cost.  SECONDARY: none    Medicaid Application Date:       Case Manager:  Disability Application Date:       Case Worker:   The Data Collection Information Summary for patients in Inpatient Rehabilitation Facilities with attached Privacy Act [place] Records was provided and verbally reviewed with: Family and patient  Emergency Contact Information         Contact Information    Name Relation Home Work Mobile   [place] Spouse [phone]  [phone]   [facility] Daughter [phone]  [phone]   [place], Kidney   [phone]      Current Medical History  Patient Admitting Diagnosis: CVA  History of Present Illness: 85 year old  year-old right-handed female history of hypertension, hyperlipidemia, diabetes mellitus, CVA 05/03/2018 maintained on aspirin 325 mg as well as left anterior parafalcine meningioma, she quit smoking 29 years ago, right shoulder arthroplasty.  Presented 12/22/2020 [facility] with left upper extremity weakness x2 to 3 days. She denied any chest pain or shortness of breath no nausea vomiting. Cranial CT scan negative for acute changes. Chronic microvascular ischemic changes as well as prior right MCA distribution infarct and prior right basal ganglia lacunar infarct. Patient did not receive TPA. Carotid Doppler showed chronically occluded right ICA as well as moderate left ICA atherosclerosis. Left ICA stenosis estimated at 50 to 69%. MRI/MRA showed multiple  small acute right MCA infarcts as well as stable 3.7 cm left anterior parafalcine meningioma unchanged since 2019. Echocardiogram with ejection fraction of 53% normal systolic function. EEG negative for seizure. COVID, hemoglobin 12.1, negative, glucose 329, hemoglobin A1c 10.3, TSH level 0.042. Patient was discharged home 12/27/2018 maintained on full-strength aspirin ambulating minimal guard. Patient developed increased left-sided weakness and admitted to the office of Dr. Sinda Du 12/29/2018. Follow-up MRI showed multiple new acute right MCA infarcts involving the frontal and parietal lobe. Subacute and chronic infarcts also noted. Patient was transferred to [facility] for further  evaluation. CTA of head and neck showed bilateral string sign stenosis of both ICA origins likely due to bulky calcification. Vascular surgery consulted for carotid stenosis Dr. Carlis Abbott who currently recommends no surgery at this time recommendations for outpatient follow-up after rehabilitation completed for CVA. Plavix was added in addition to patient aspirin. Subcutaneous Lovenox for DVT prophylaxis. Tolerating a regular consistency diet.   Complete NIHSS TOTAL: 9  Patient's medical record from [facility]  has been reviewed by the rehabilitation admission coordinator and physician.  Past Medical History      Past Medical History:  Diagnosis Date   Arthritis    "some joint pain once in awhile" (05/26/2017)   CVA (cerebral vascular accident) ([place]) 2019   History of kidney stones    Hyperlipemia    Hypertension    Hypothyroidism (acquired)    Pneumothorax 02/05/2017   right-after fall   Rib fractures 02/05/2017   right side   Stroke ([place])    Type II diabetes mellitus ([place])     Family History   family history is not on file.  Prior Rehab/Hospitalizations Has the patient had prior rehab or hospitalizations prior to admission? Yes  Has the patient had major surgery during 100 days prior to admission? No              Current Medications  Current Facility-Administered Medications:     stroke: mapping our early stages of recovery book, , Does not apply, Once, Vasireddy, Grier Mitts, MD   acetaminophen (TYLENOL) tablet 650 mg, 650 mg, Oral, Q6H PRN, 650 mg at 01/01/19 0912 **OR** acetaminophen (TYLENOL) suppository 650 mg, 650 mg, Rectal, Q6H PRN, Jani Gravel, MD   amLODipine (NORVASC) tablet 5 mg, 5 mg, Oral, Daily, Jani Gravel, MD, 5 mg at 01/03/19 0945   aspirin tablet 325 mg, 325 mg, Oral, Daily, Jani Gravel, MD, 325 mg at 01/03/19 0945   atorvastatin (LIPITOR) tablet 80 mg, 80 mg, Oral, q1800, Rosalin Hawking, MD, 80 mg at 01/02/19 1806    clopidogrel (PLAVIX) tablet 75 mg, 75 mg, Oral, Daily, Jani Gravel, MD, 75 mg at 01/03/19 0945   enoxaparin (LOVENOX) injection 40 mg, 40 mg, Subcutaneous, Q24H, Jani Gravel, MD, 40 mg at 01/02/19 1438   insulin aspart (novoLOG) injection 0-9 Units, 0-9 Units, Subcutaneous, Q4H, Jani Gravel, MD, 5 Units at 01/03/19 0838   insulin glargine (LANTUS) injection 15 Units, 15 Units, Subcutaneous, Daily, Monica Becton, MD, 15 Units at 01/03/19 0945   levothyroxine (SYNTHROID) tablet 100 mcg, 100 mcg, Oral, q morning - 10a, Jani Gravel, MD, 100 mcg at 01/03/19 6468   multivitamin (PROSIGHT) tablet 1 tablet, 1 tablet, Oral, Daily, Sinda Du, MD, 1 tablet at 01/03/19 0945   sodium chloride flush (NS) 0.9 % injection 3 mL, 3 mL, Intravenous, Q12H, Jani Gravel, MD, 3 mL at 01/02/19 2224   sodium chloride flush (NS) 0.9 % injection 3 mL, 3 mL, Intravenous,  Q12H, Marty Heck, MD, 3 mL at 01/03/19 0947   sodium chloride flush (NS) 0.9 % injection 3 mL, 3 mL, Intravenous, PRN, Marty Heck, MD   vitamin B-12 (CYANOCOBALAMIN) tablet 1,000 mcg, 1,000 mcg, Oral, Daily, Jani Gravel, MD, 1,000 mcg at 01/03/19 0945  Patients Current Diet:     Diet Order                  Diet - low sodium heart healthy         Diet heart healthy/carb modified Room service appropriate? Yes with Assist; Fluid consistency: Thin; Fluid restriction: 1500 mL Fluid  Diet effective now               Precautions / Restrictions Precautions Precautions: Fall Precaution Comments: LUE neglect, hemiparesis Restrictions Weight Bearing Restrictions: No   Has the patient had 2 or more falls or a fall with injury in the past year? No  Prior Activity Level Limited Community (1-2x/wk): Independent before recent CVA and admit at [facility]: Did the patient need help bathing, dressing, using the toilet or eating? Independent  Indoor Mobility: Did the patient  need assistance with walking from room to room (with or without device)? Independent  Stairs: Did the patient need assistance with internal or external stairs (with or without device)? Independent  Functional Cognition: Did the patient need help planning regular tasks such as shopping or remembering to take medications? Needed some help  Home Assistive Devices / [place] II Devices/Equipment: Shower chair with back, Bedside commode/3-in-1, Environmental consultant (specify type), Wheelchair, CBG Meter, Cane (specify quad or straight) Home Equipment: Environmental consultant - 2 wheels, Cane - single point, Bedside commode, Grab bars - tub/shower, Wheelchair - manual, Shower seat  Prior Device Use: Indicate devices/aids used by the patient prior to current illness, exacerbation or injury? cane  Current Functional Level Cognition  Overall Cognitive Status: Impaired/Different from baseline Orientation Level: Oriented to person, Oriented to place, Oriented to situation, Disoriented to time Safety/Judgement: Decreased awareness of safety, Decreased awareness of deficits General Comments: A&Ox4, decreased safety awareness, asking husband to assist her back to bed after education provided by PT for transfers by STAFF only    Extremity Assessment (includes Sensation/Coordination)  Upper Extremity Assessment: LUE deficits/detail LUE Deficits / Details: No scapular movement, no bicep/ tricep, wrist movement, pt did flicker 2nd digit and did fincker thumb once for each during session. pt however unabel to return demo.  LUE Sensation: decreased light touch LUE Coordination: decreased fine motor, decreased gross motor  Lower Extremity Assessment: Defer to PT evaluation RLE Deficits / Details: grossly 5/5 LLE Deficits / Details: grossly 3/5    ADLs  Overall ADL's : Needs assistance/impaired Eating/Feeding: Minimal assistance Grooming: Brushing hair Upper Body Bathing: Moderate assistance Lower Body Bathing:  Moderate assistance Toilet Transfer: Moderate assistance Toilet Transfer Details (indicate cue type and reason): pt requires (A) to elevate and position R UE  Functional mobility during ADLs: Moderate assistance General ADL Comments: Pt progressed from chair to other side of the bed and back. pt demonstrates scissor gate and circumduction. pt needs rest break and to reposition LB to start again. Pt needs (A) for balance and position of R UE    Mobility  Overal bed mobility: Needs Assistance Bed Mobility: Sit to Supine, Supine to Sit Supine to sit: Mod assist Sit to supine: Mod assist General bed mobility comments: ModA for scooting hips forward with bed pad to seated on edge  of bed, modA for return to bed to assist with BLE negotiation back into bed    Transfers  Overall transfer level: Needs assistance Equipment used: 1 person hand held assist Transfers: Sit to/from Stand, Stand Pivot Transfers Sit to Stand: Mod assist Stand pivot transfers: Mod assist General transfer comment: ModA to stand with left knee blocked and pivot towards left and right. Difficulty sequencing and initiating steps    Ambulation / Gait / Stairs / Wheelchair Mobility  Ambulation/Gait Ambulation/Gait assistance: Herbalist (Feet): 15 Feet(x3) Assistive device: (R railing) Gait Pattern/deviations: Step-to pattern, Decreased weight shift to left, Trunk flexed, Decreased step length - right General Gait Details: MinA for gait, holding onto R railing. Tactile facilitation provided for left knee flexion during swing phase and to decrease hyperextension during stance phase. Cues for upright, decreased left lateral lean, heel strike at initial contact. Decreased control of LLE, chair follow utilized Gait velocity: decreased Gait velocity interpretation: <1.8 ft/sec, indicate of risk for recurrent falls    Posture / Balance Dynamic Sitting Balance Sitting balance - Comments: Right lateral lean able  to self correct with cues  Balance Overall balance assessment: Needs assistance Sitting-balance support: Feet supported, Single extremity supported Sitting balance-Leahy Scale: Fair Sitting balance - Comments: Right lateral lean able to self correct with cues  Postural control: Left lateral lean(forgets to use R UE for support) Standing balance support: Single extremity supported, During functional activity Standing balance-Leahy Scale: Fair Standing balance comment: railing or RW with R UE    Special needs/care consideration BiPAP/CPAP n/a CPM  N/a Continuous Drip IV  N/a Dialysis n/a Life Vest  N/a Oxygen  N/a Special Bed  N/a Trach Size  N/a Wound Vac n/a Skin MASD to buttocks Bowel mgmt:  Continent LBM 8/16 Bladder mgmt: incontinent Diabetic mgmt:  Hgb A1c 10.3 Behavioral consideration  N/a daughter report some short term memory issues pta Chemo/radiation  N/a Spouse , [name] is designated visitor   Previous Home Environment  Living Arrangements: Spouse/significant other  Lives With: Spouse Available Help at Discharge: (spouse and daughter, [name]) Type of Home: House Home Layout: Two level, Able to live on main level with bedroom/bathroom Alternate Level Stairs-Rails: Left Alternate Level Stairs-Number of Steps: 12 + 5 steps to go upstairs Home Access: Stairs to enter Entrance Stairs-Rails: Right Entrance Stairs-Number of Steps: 2 Bathroom Shower/Tub: Tub/shower unit, Architectural technologist: Handicapped height Bathroom Accessibility: Yes Home Care Services: Yes Type of Home Care Services: Home PT, Home RN [place] (if known): [facility]  Discharge Living Setting Plans for Discharge Living Setting: Patient's home, Lives with (comment)(spouse) Type of Home at Discharge: House Discharge Home Layout: Two level, Able to live on main level with bedroom/bathroom Alternate Level Stairs-Rails: Left Alternate Level Stairs-Number of Steps: 12 +  5 Discharge Home Access: Stairs to enter Entrance Stairs-Rails: Right Entrance Stairs-Number of Steps: 2 Discharge Bathroom Shower/Tub: Tub/shower unit Discharge Bathroom Toilet: Handicapped height Discharge Bathroom Accessibility: Yes How Accessible: Accessible via walker Does the patient have any problems obtaining your medications?: No  Social/Family/Support Systems Patient Roles: Spouse, Parent Contact Information: spouse [name] , main contatc but daughter, [name] wants to be kept in [place] also for Dad gets things mixed up Anticipated Caregiver: spouse and daughter, Beth Anticipated Ambulance person Information: see above Ability/Limitations of Caregiver: [name] works as Chiropractor; spouse very [place] and gets meds mixed up and follow up care Caregiver Availability: 24/7 Discharge Plan Discussed with Primary Caregiver: Yes Is Caregiver In Agreement  with Plan?: Yes Does Caregiver/Family have Issues with Lodging/Transportation while Pt is in Rehab?: No  Goals/Additional Needs Patient/Family Goal for Rehab: Mod I to superrviison with PT, sueprvision to min with OT, supervision with SLP Expected length of stay: ELOS 10 to 14 days Special Service Needs: SPouse very controling over her meds per daughter and may get things mixed up Additional Information: Will follow up after d/c with CVTS for options for Carotid enderaterectomy/ Dr. Carlis Abbott Pt/Family Agrees to Admission and willing to participate: Yes Program Orientation Provided & Reviewed with Pt/Caregiver Including Roles  & Responsibilities: Yes  Decrease burden of Care through IP rehab admission: n/a  Possible need for SNF placement upon discharge:  not anticipated  Patient Condition: I have reviewed medical records from [facility], spoken with  patient, spouse and daughter. I met with patient at the bedside for inpatient rehabilitation assessment.  Patient will benefit from ongoing PT, OT and SLP, can actively  participate in 3 hours of therapy a day 5 days of the week, and can make measurable gains during the admission.  Patient will also benefit from the coordinated team approach during an Inpatient Acute Rehabilitation admission.  The patient will receive intensive therapy as well as Rehabilitation physician, nursing, social worker, and care management interventions.  Due to bladder management, bowel management, safety, skin/wound care, disease management, medication administration, pain management and patient education the patient requires 24 hour a day rehabilitation nursing.  The patient is currently min to mod assist with mobility and basic ADLs.  Discharge setting and therapy post discharge at home with home health is anticipated.  Patient has agreed to participate in the Acute Inpatient Rehabilitation Program and will admit today.  Preadmission Screen Completed By:  Cleatrice Burke RN MSN, 01/03/2019 11:10 AM ______________________________________________________________________   Discussed status with Dr. Naaman Plummer  on  01/03/2019 at  1108 and received approval for admission today.  Admission Coordinator:  Cleatrice Burke, RN, time  7062 Date  01/03/2019   Assessment/Plan: Diagnosis: CVA 1. Does the need for close, 24 hr/day Medical supervision in concert with the patient's rehab needs make it unreasonable for this patient to be served in a less intensive setting? Yes 2. Co-Morbidities requiring supervision/potential complications: htn, diabetes, cva 3. Due to bladder management, bowel management, safety, skin/wound care, disease management, medication administration, pain management and patient education, does the patient require 24 hr/day rehab nursing? Yes 4. Does the patient require coordinated care of a physician, rehab nurse, PT (1-2 hrs/day, 5 days/week), OT (1-2 hrs/day, 5 days/week) and SLP (1-2 hrs/day, 5 days/week) to address physical and functional deficits in the context of  the above medical diagnosis(es)? Yes Addressing deficits in the following areas: balance, endurance, locomotion, strength, transferring, bowel/bladder control, bathing, dressing, feeding, grooming, toileting, cognition, speech and psychosocial support 5. Can the patient actively participate in an intensive therapy program of at least 3 hrs of therapy 5 days a week? Yes 6. The potential for patient to make measurable gains while on inpatient rehab is excellent 7. Anticipated functional outcomes upon discharge from inpatients are: modified independent and supervision PT, supervision and min assist OT, supervision SLP 8. Estimated rehab length of stay to reach the above functional goals is: 10-14 days 9. Anticipated D/C setting: Home 10. Anticipated post D/C treatments: [place] therapy 11. Overall Rehab/Functional Prognosis: excellent  MD Signature: Meredith Staggers, MD, [place] [facility] 01/03/2019         Revision History

## 2019-01-03 NOTE — Discharge Summary (Signed)
Physician Discharge Summary  Danielle [name]:[id number] DOB: Feb 16, 1932 DOA: 12/29/2018  PCP: Sinda Du, MD  Admit date: 12/29/2018 Discharge date: 01/03/2019  Time spent: 40 minutes  Recommendations for Outpatient Follow-up:  1. Follow-up with the neurology, vascular surgery 2. Patient will be discharged to inpatient rehab   Discharge Diagnoses:  Principal Problem:   Carotid stenosis, bilateral Active Problems:   Stroke [facility])   Discharge Condition: Stable  Diet recommendation: Cardiac  Filed Weights   12/29/18 1204 12/30/18 0303  Weight: 60 kg 60.1 kg    History of present illness and Hospital Course:  85 year old female with history of hypertension, hyperlipidemia, diabetes mellitus, hypothyroidism, meningioma of left falx, CVA 05/03/2018, 12/23/2018 for right MCA stroke with a right carotid occlusion presented to primary care physician's office with the worsening of the left-sided weakness, ataxia.  CT of the neck showed positive for bilateral radiographic string sign stenosis of both ICA origins due to bulky calcification.  The right MCA is threadlike distal to the severe stenosis and occludes in the mid right siphon.  There is a suboptimal reconstitution of the distal right ICA and the right MCA and right ACA are patent but diminutive.  The left ICA maintains in normal caliber distal to the severe stenosis.  No vertebral artery or basilar artery stenosis.  MRI of the brain showed multiple new acute right MCA infarcts involving the frontal and parietal lobes.  Subacute and chronic infarcts of the insula, basal ganglia and frontal lobe with the hemosiderin staining and ex vacuo dilatation of the right frontal horn.  Neurology is consulted, recommended cerebral angiogram to further evaluate.  Continue with aspirin, Lipitor, Plavix.  Patient is evaluated by physical therapy, recommended inpatient rehab.  Patient will be discharged to inpatient rehab.  Assessment &  Plan:  ##CVA with the extension of the strokes from 12/23/2018 -Patient has worsening of the weakness in the left arm and left lower extremity. -Continue with aspirin, Plavix, statin -MRI of the brain showed multiple new acute right MCA infarcts, CT angiogram shows bilateral string sign with severe stenosis -Neurology stroke team is following with the patient, plan to do angiogram of the carotids on 12/30/2018 and possible intervention -Continue with physical therapy, Occupational Therapy, speech therapy evaluation commended inpatient rehab.   -CIR working on authorization  ##Bilateral carotid artery stenosis -Consulted vascular surgery, recommended patient to follow-up as an outpatient once patient recovers from recent stroke or possible carotid endarterectomy  ##Hypertension -Permissible hypertension -Well-controlled  ##Hyperlipidemia -Continue with the Lipitor  ##Diabetes mellitus controlled -Last hemoglobin A1c was 10.3 -Increase Lantus 20 units daily -Follow-up on sliding scale insulin  ##Hypothyroidism -Continue with the Synthroid      Consultations:  Neurology  Vascular surgery  Discharge Exam: Vitals:   01/03/19 0736 01/03/19 1236  BP: (!) 120/48 (!) 129/56  Pulse: 72 79  Resp: 20 20  Temp: 98.2 F (36.8 C) 98.1 F (36.7 C)  SpO2: 98% 97%    General exam: Appears calm and comfortable  Respiratory system: Clear to auscultation. Respiratory effort normal. Cardiovascular system: S1 & S2 heard, RRR. No JVD, murmurs, rubs, gallops or clicks. No pedal edema. Gastrointestinal system: Abdomen is nondistended, soft and nontender. No organomegaly or masses felt. Normal bowel sounds heard. Central nervous system: Alert and oriented.  Left upper extremity 0/5, left lower extremity 1/5, right upper and lower extremities 5/5.  Mild dysarthria Skin: No rashes, lesions or ulcers Psychiatry: Judgement and insight appear normal. Mood & affect appropriate.  Discharge  Instructions   Discharge Instructions    Ambulatory referral to Neurology   Complete by: As directed    Follow up with Dr. Leonie Man at [facility] in 4-6 weeks. Too complicated for NP to follow. Thanks.   Diet - low sodium heart healthy   Complete by: As directed    Increase activity slowly   Complete by: As directed      Allergies as of 01/03/2019      Reactions   Fosamax [alendronate Sodium] Other (See Comments)   MUSCLE ACHES   Prednisone    Feels jittery and nauseous      Medication List    STOP taking these medications   guaiFENesin 600 MG 12 hr tablet Commonly known as: MUCINEX   multivitamin with minerals Tabs tablet   multivitamin-lutein Caps capsule     TAKE these medications   amLODipine 5 MG tablet Commonly known as: NORVASC Take 1 tablet (5 mg total) by mouth daily.   aspirin 325 MG tablet Take 1 tablet (325 mg total) by mouth daily.   atorvastatin 80 MG tablet Commonly known as: LIPITOR Take 1 tablet (80 mg total) by mouth daily at 6 PM. What changed:   medication strength  how much to take   CALCIUM 600 + D PO Take 1 tablet by mouth daily.   Fish Oil 1000 MG Caps Take 1,000 mg by mouth daily.   insulin aspart 100 UNIT/ML injection Commonly known as: novoLOG Inject 0-9 Units into the skin every 4 (four) hours.   insulin lispro 100 UNIT/ML injection Commonly known as: HUMALOG Inject 12 Units into the skin daily.   Synthroid 100 MCG tablet Generic drug: levothyroxine Take 100 mcg by mouth every morning.   Toujeo SoloStar 300 UNIT/ML Sopn Generic drug: Insulin Glargine (1 Unit Dial) Inject 20-22 Units into the skin every morning.   vitamin B-12 1000 MCG tablet Commonly known as: CYANOCOBALAMIN Take 1,000 mcg by mouth daily.      Allergies  Allergen Reactions  . Fosamax [Alendronate Sodium] Other (See Comments)    MUSCLE ACHES  . Prednisone     Feels jittery and nauseous   Follow-up Information    Garvin Fila, MD. Schedule an  appointment as soon as possible for a visit in 4 week(s).   Specialties: Neurology, Radiology Contact information: [address] [place] [address] [phone]        Marty Heck, MD. Schedule an appointment as soon as possible for a visit in 3 week(s).   Specialty: Vascular Surgery Contact information: [place] [address] [phone]        Sinda Du, MD Follow up in 2 week(s).   Specialty: Pulmonary Disease Contact information: [address] [place] [address] [phone]            The results of significant diagnostics from this hospitalization (including imaging, microbiology, ancillary and laboratory) are listed below for reference.    Significant Diagnostic Studies: Ct Angio Head W Or Wo Contrast  Addendum Date: 12/29/2018   ADDENDUM REPORT: 12/29/2018 23:52 ADDENDUM: Study discussed by telephone with Dr. Jani Gravel on 12/29/2018 at 2337 hours. Electronically Signed   By: Genevie Ann M.D.   On: 12/29/2018 23:52   Result Date: 12/29/2018 CLINICAL DATA:  85 year old female with acute, subacute, and chronic right MCA territory infarcts. 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 EXAM: CT ANGIOGRAPHY HEAD AND NECK TECHNIQUE: Multidetector CT imaging of the head and neck was  performed using the standard protocol during bolus administration of intravenous contrast. Multiplanar CT image reconstructions and MIPs were obtained to evaluate the vascular anatomy. Carotid stenosis measurements (when applicable) are obtained utilizing NASCET criteria, using the distal internal carotid diameter as the denominator. CONTRAST:  146mL OMNIPAQUE IOHEXOL 350 MG/ML SOLN COMPARISON:  Brain MRI 1305 hours today and earlier. Neck MRA 05/03/2018. FINDINGS: CT HEAD Brain: Patchy and confluent hypodensity in the right MCA territory corresponding to the various ischemia. No associated hemorrhage. No associated mass effect. No  ventriculomegaly. Stable gray-white matter differentiation elsewhere since 12/23/2018. Left anterior paraspinal seen meningioma measuring about 2.7 centimeters diameter redemonstrated on series 5, image 9. Calvarium and skull base: No acute osseous abnormality identified. Paranasal sinuses: Visualized paranasal sinuses and mastoids are stable and well pneumatized. Orbits: No acute orbit or scalp soft tissue findings. CTA NECK Skeleton: Severe cervical spine degeneration superimposed on congenital appearing incomplete segmentation of C5-C6. Partially visible thoracic scoliosis and right shoulder arthroplasty. No acute osseous abnormality identified. Upper chest: Negative. Other neck: Negative. Aortic arch: Mild to moderate arch atherosclerosis. Three vessel arch configuration. Right carotid system: No brachiocephalic or right CCA origin stenosis. Mild soft and calcified plaque in the right CCA proximal to the bifurcation. There is confluent calcification of the right ICA origin resulting in near occlusion of the vessel, string sign type reconstitution occurs at the bulb (series 13, image 65), and the vessel is patent but highly diminutive and thread-like to the skull base. See series 9, image 65. Left carotid system: No left CCA origin stenosis and minimal plaque proximal to the left carotid bifurcation. The left ICA origin is also heavily calcified with string sign stenosis. The distal bulb is diminutive, only 2-3 millimeters diameter. However, the remaining cervical left ICA has a more normal caliber and is patent to the skull base without additional stenosis. Vertebral arteries: No proximal right subclavian artery stenosis despite plaque. The right vertebral artery is non dominant. Calcified plaque near the right vertebral origin results in no significant stenosis. The right vertebral is patent to the skull base without stenosis. No proximal left subclavian artery stenosis despite soft and calcified plaque.  Dominant left vertebral artery with a normal origin. The left vertebral is patent to the skull base without stenosis. CTA HEAD Posterior circulation: Mildly dominant left V4 segment. No distal vertebral stenosis. Normal right PICA origin. The left AICA appears dominant and patent. Patent vertebrobasilar junction and basilar artery without stenosis. Patent AICA and SCA origins. Patent right PCA origin with mild irregularity but no significant stenosis. There is a fetal type left PCA. The right posterior communicating artery is diminutive or absent. Left PCA branches are within normal limits. There is additional moderate stenosis in the right PCA P2 segment, but the distal right PCA is within normal limits. Anterior circulation: Thread-like enhancement of the right ICA at the skull base is noted but the right siphon is occluded in the distal petrous segment. Enhancement is reconstituted at the distal cavernous segment and the supraclinoid right ICA is patent although with asymmetrically decreased enhancement. The right ICA terminus is patent. The right A1 and M1 are patent but diminutive (series 15, image 16). The right MCA bifurcation is patent. The right MCA branches are patent but diminutive (series 14, image 19). The left ICA siphon is patent without stenosis despite some calcified plaque. Normal left posterior communicating artery origin. Normal left ICA terminus, left ACA origin. An anterior communicating artery is identified, although the left ACA remains larger than  the right throughout. The ACA branches are within normal limits except for some mass effect related to the para falcine meningioma. The left MCA origin is normal with mild irregularity and stenosis in the left M1. The left MCA bifurcation is patent without stenosis. Left MCA branches are within normal limits. Venous sinuses: Patent. Anatomic variants: Dominant left vertebral artery. Review of the MIP images confirms the above findings IMPRESSION:  1. 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 - the Right ICA is thread-like distal to the severe stenosis, and occludes in the mid right siphon. - there is suboptimal reconstitution of the distal right ICA (see #2) and the right MCA and right ACA are patent but diminutive. - the left ICA maintains a more normal caliber distal to the severe stenosis. And the left anterior circulation is largely normal. 2. There is no vertebral or basilar artery stenosis, but the Right Pcomm is diminutive or absent. There is up to moderate right PCA stenosis. 3. Expected CT appearance of right MCA ischemia. No intracranial hemorrhage or mass effect. 4. Chronic left para falcine meningioma. 5. 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 Electronically Signed: By: Genevie Ann M.D. On: 12/29/2018 23:29   Ct Head Wo Contrast  Result Date: 12/23/2018 CLINICAL DATA:  Left upper extremity weakness EXAM: CT HEAD WITHOUT CONTRAST TECHNIQUE: Contiguous axial images were obtained from the base of the skull through the vertex without intravenous contrast. COMPARISON:  09/27/2018 FINDINGS: Brain: Redemonstration of a mass arising from the anterior falx on the left measuring approximately 2.6 x 2.8 cm, stable in size compared to prior. Internal focus of low-density likely internal cystic change, also stable from prior. Mass results in mild mass effect on the anterior horn of the left lateral ventricle. No appreciable surrounding edema. Encephalomalacia in the right frontal lobe from prior right MCA distribution infarct. Additional areas of encephalomalacia in the right basal ganglia from prior infarcts, also unchanged. No evidence of new infarct, hemorrhage, or extra-axial fluid collection. Scattered low-density changes within the periventricular and subcortical white matter compatible with chronic microvascular ischemic change.  Ventricular size and configuration is unchanged. Vascular: Atherosclerotic calcifications involving the large vessels of the skull base. No unexpected hyperdense vessel. Skull: Normal. Negative for fracture or focal lesion. Sinuses/Orbits: No acute finding. Other: None. IMPRESSION: 1. No evidence of an acute intracranial abnormality. 2. Stable size of left anterior parafalcine meningioma. 3. Chronic microvascular ischemic changes as well as prior right MCA distribution infarct and prior right basal ganglia lacunar infarcts. Electronically Signed   By: Davina Poke M.D.   On: 12/23/2018 12:02   Ct Angio Neck W Or Wo Contrast  Addendum Date: 12/29/2018   ADDENDUM REPORT: 12/29/2018 23:52 ADDENDUM: Study discussed by telephone with Dr. Jani Gravel on 12/29/2018 at 2337 hours. Electronically Signed   By: Genevie Ann M.D.   On: 12/29/2018 23:52   Result Date: 12/29/2018 CLINICAL DATA:  85 year old female with acute, subacute, and chronic right MCA territory infarcts. 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 EXAM: CT ANGIOGRAPHY HEAD AND NECK TECHNIQUE: Multidetector CT imaging of the head and neck was performed using the standard protocol during bolus administration of intravenous contrast. Multiplanar CT image reconstructions and MIPs were obtained to evaluate the vascular anatomy. Carotid stenosis measurements (when applicable) are obtained utilizing NASCET criteria, using the distal internal carotid diameter as the denominator. CONTRAST:  120mL OMNIPAQUE IOHEXOL 350 MG/ML SOLN COMPARISON:  Brain  MRI 1305 hours today and earlier. Neck MRA 05/03/2018. FINDINGS: CT HEAD Brain: Patchy and confluent hypodensity in the right MCA territory corresponding to the various ischemia. No associated hemorrhage. No associated mass effect. No ventriculomegaly. Stable gray-white matter differentiation elsewhere since 12/23/2018. Left anterior paraspinal seen meningioma measuring about 2.7 centimeters diameter  redemonstrated on series 5, image 9. Calvarium and skull base: No acute osseous abnormality identified. Paranasal sinuses: Visualized paranasal sinuses and mastoids are stable and well pneumatized. Orbits: No acute orbit or scalp soft tissue findings. CTA NECK Skeleton: Severe cervical spine degeneration superimposed on congenital appearing incomplete segmentation of C5-C6. Partially visible thoracic scoliosis and right shoulder arthroplasty. No acute osseous abnormality identified. Upper chest: Negative. Other neck: Negative. Aortic arch: Mild to moderate arch atherosclerosis. Three vessel arch configuration. Right carotid system: No brachiocephalic or right CCA origin stenosis. Mild soft and calcified plaque in the right CCA proximal to the bifurcation. There is confluent calcification of the right ICA origin resulting in near occlusion of the vessel, string sign type reconstitution occurs at the bulb (series 13, image 65), and the vessel is patent but highly diminutive and thread-like to the skull base. See series 9, image 65. Left carotid system: No left CCA origin stenosis and minimal plaque proximal to the left carotid bifurcation. The left ICA origin is also heavily calcified with string sign stenosis. The distal bulb is diminutive, only 2-3 millimeters diameter. However, the remaining cervical left ICA has a more normal caliber and is patent to the skull base without additional stenosis. Vertebral arteries: No proximal right subclavian artery stenosis despite plaque. The right vertebral artery is non dominant. Calcified plaque near the right vertebral origin results in no significant stenosis. The right vertebral is patent to the skull base without stenosis. No proximal left subclavian artery stenosis despite soft and calcified plaque. Dominant left vertebral artery with a normal origin. The left vertebral is patent to the skull base without stenosis. CTA HEAD Posterior circulation: Mildly dominant left V4  segment. No distal vertebral stenosis. Normal right PICA origin. The left AICA appears dominant and patent. Patent vertebrobasilar junction and basilar artery without stenosis. Patent AICA and SCA origins. Patent right PCA origin with mild irregularity but no significant stenosis. There is a fetal type left PCA. The right posterior communicating artery is diminutive or absent. Left PCA branches are within normal limits. There is additional moderate stenosis in the right PCA P2 segment, but the distal right PCA is within normal limits. Anterior circulation: Thread-like enhancement of the right ICA at the skull base is noted but the right siphon is occluded in the distal petrous segment. Enhancement is reconstituted at the distal cavernous segment and the supraclinoid right ICA is patent although with asymmetrically decreased enhancement. The right ICA terminus is patent. The right A1 and M1 are patent but diminutive (series 15, image 16). The right MCA bifurcation is patent. The right MCA branches are patent but diminutive (series 14, image 19). The left ICA siphon is patent without stenosis despite some calcified plaque. Normal left posterior communicating artery origin. Normal left ICA terminus, left ACA origin. An anterior communicating artery is identified, although the left ACA remains larger than the right throughout. The ACA branches are within normal limits except for some mass effect related to the para falcine meningioma. The left MCA origin is normal with mild irregularity and stenosis in the left M1. The left MCA bifurcation is patent without stenosis. Left MCA branches are within normal limits. Venous sinuses: Patent.  Anatomic variants: Dominant left vertebral artery. Review of the MIP images confirms the above findings IMPRESSION: 1. 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 - the Right ICA is thread-like distal to the severe stenosis, and  occludes in the mid right siphon. - there is suboptimal reconstitution of the distal right ICA (see #2) and the right MCA and right ACA are patent but diminutive. - the left ICA maintains a more normal caliber distal to the severe stenosis. And the left anterior circulation is largely normal. 2. There is no vertebral or basilar artery stenosis, but the Right Pcomm is diminutive or absent. There is up to moderate right PCA stenosis. 3. Expected CT appearance of right MCA ischemia. No intracranial hemorrhage or mass effect. 4. Chronic left para falcine meningioma. 5. 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 Electronically Signed: By: Genevie Ann M.D. On: 12/29/2018 23:29   Mr Angio Head Wo Contrast  Result Date: 12/23/2018 CLINICAL DATA:  Left arm weakness. EXAM: MRI HEAD WITHOUT CONTRAST MRA HEAD WITHOUT CONTRAST TECHNIQUE: Multiplanar, multiecho pulse sequences of the brain and surrounding structures were obtained without intravenous contrast. Angiographic images of the head were obtained using MRA technique without contrast. COMPARISON:  Head CT 12/23/2018.  Head MRI and MRA 05/03/2018. FINDINGS: MRI HEAD FINDINGS Brain: There are multiple small foci of restricted diffusion in the right MCA territory consistent with acute infarcts primarily involving cortex of the right frontal and parietal lobes. A chronic right MCA infarct is again noted involving the insula, basal ganglia, and frontal lobe with ex vacuo dilatation of the right frontal horn and hemosiderin staining. Scattered T2 hyperintensities elsewhere in the cerebral white matter bilaterally are similar to the prior MRI and nonspecific but compatible with mild chronic small vessel ischemic disease. An extra-axial mass anteriorly along the falx on the left has not significantly changed in size from the prior MRI and measures 2.2 x 2.8 x 3.7 cm. There is local mass effect on the left frontal lobe without  edema. Mild generalized cerebral atrophy is not greater than expected for age. Vascular: Chronic right ICA occlusion. Skull and upper cervical spine: Unremarkable bone marrow signal. Sinuses/Orbits: Bilateral cataract extraction. Paranasal sinuses and mastoid air cells are clear. Other: None. MRA HEAD FINDINGS The visualized distal vertebral arteries are patent to the basilar with the left being dominant. The basilar artery is patent with minimal narrowing distally. There is a large left posterior communicating artery. Severe bilateral P2 stenoses are again noted. There is chronic occlusion of the right ICA with distal reconstitution near the terminus. The right M1 and proximal M2 segments are poorly visualized, more so than on the prior MRA and suggesting severely reduced flow. There is some reconstitution of more robust flow related enhancement in the more distal right MCA branches. No significant flow related enhancement is apparent today in the right A1 segment which may reflect further reduced flow or interval occlusion. The right A2 segment is patent and supplied by the left ACA via the anterior communicating artery. The left intracranial ICA is widely patent. The left ACA and left MCA are patent without evidence of proximal branch occlusion or significant proximal stenosis. No aneurysm is identified. IMPRESSION: 1. Multiple small acute right MCA infarcts. 2. Chronic right MCA infarct. 3. 3.7 cm left anterior parafalcine meningioma, not significantly changed in size from 2019 and without edema. 4. 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 Severely  diminished flow in the proximal right MCA and possible interval occlusion of the right A1 segment. Electronically Signed   By: Logan Bores M.D.   On: 12/23/2018 14:57   Mr Brain Wo Contrast  Result Date: 12/29/2018 CLINICAL DATA:  Leg weakness with difficulty walking. Recent stroke. EXAM: MRI HEAD WITHOUT CONTRAST TECHNIQUE:  Multiplanar, multiecho pulse sequences of the brain and surrounding structures were obtained without intravenous contrast. COMPARISON:  12/23/2018 FINDINGS: Brain: Many of the small acute right MCA infarcts on the recent prior MRI demonstrate diminished diffusion signal abnormality. However, there are multiple new acute right MCA infarcts involving frontal and parietal cortex which are overall larger than those on the prior study including a 3 cm infarct in the right precentral gyrus in the hand motor region which extends inferiorly into the centrum semiovale. There is no associated hemorrhage. A chronic right MCA infarct is again noted involving the insula, basal ganglia, and frontal lobe with hemosiderin staining and ex vacuo dilatation of the right frontal horn. An extra-axial left frontal mass anteriorly along the falx is unchanged, measuring 2.1 x 2.8 x 3.6 cm with local mass effect but no significant edema. There is no midline shift or extra-axial fluid collection. Mild generalized cerebral atrophy is not greater than expected for age. Vascular: Chronic right ICA occlusion. Skull and upper cervical spine: Unremarkable bone marrow signal. Sinuses/Orbits: Bilateral cataract extraction. Paranasal sinuses and mastoid air cells are clear. Other: None. IMPRESSION: 1. Multiple new acute right MCA infarcts involving the frontal and parietal lobes. 2. Subacute and chronic infarcts as above. 3. Unchanged left para falcine meningioma. 4. Chronic occlusion of the right ICA. Electronically Signed   By: Logan Bores M.D.   On: 12/29/2018 17:00   Mr Brain Wo Contrast  Result Date: 12/23/2018 CLINICAL DATA:  Left arm weakness. EXAM: MRI HEAD WITHOUT CONTRAST MRA HEAD WITHOUT CONTRAST TECHNIQUE: Multiplanar, multiecho pulse sequences of the brain and surrounding structures were obtained without intravenous contrast. Angiographic images of the head were obtained using MRA technique without contrast. COMPARISON:  Head CT  12/23/2018.  Head MRI and MRA 05/03/2018. FINDINGS: MRI HEAD FINDINGS Brain: There are multiple small foci of restricted diffusion in the right MCA territory consistent with acute infarcts primarily involving cortex of the right frontal and parietal lobes. A chronic right MCA infarct is again noted involving the insula, basal ganglia, and frontal lobe with ex vacuo dilatation of the right frontal horn and hemosiderin staining. Scattered T2 hyperintensities elsewhere in the cerebral white matter bilaterally are similar to the prior MRI and nonspecific but compatible with mild chronic small vessel ischemic disease. An extra-axial mass anteriorly along the falx on the left has not significantly changed in size from the prior MRI and measures 2.2 x 2.8 x 3.7 cm. There is local mass effect on the left frontal lobe without edema. Mild generalized cerebral atrophy is not greater than expected for age. Vascular: Chronic right ICA occlusion. Skull and upper cervical spine: Unremarkable bone marrow signal. Sinuses/Orbits: Bilateral cataract extraction. Paranasal sinuses and mastoid air cells are clear. Other: None. MRA HEAD FINDINGS The visualized distal vertebral arteries are patent to the basilar with the left being dominant. The basilar artery is patent with minimal narrowing distally. There is a large left posterior communicating artery. Severe bilateral P2 stenoses are again noted. There is chronic occlusion of the right ICA with distal reconstitution near the terminus. The right M1 and proximal M2 segments are poorly visualized, more so than on the prior MRA  and suggesting severely reduced flow. There is some reconstitution of more robust flow related enhancement in the more distal right MCA branches. No significant flow related enhancement is apparent today in the right A1 segment which may reflect further reduced flow or interval occlusion. The right A2 segment is patent and supplied by the left ACA via the anterior  communicating artery. The left intracranial ICA is widely patent. The left ACA and left MCA are patent without evidence of proximal branch occlusion or significant proximal stenosis. No aneurysm is identified. IMPRESSION: 1. Multiple small acute right MCA infarcts. 2. Chronic right MCA infarct. 3. 3.7 cm left anterior parafalcine meningioma, not significantly changed in size from 2019 and without edema. 4. 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 Severely diminished flow in the proximal right MCA and possible interval occlusion of the right A1 segment. Electronically Signed   By: Logan Bores M.D.   On: 12/23/2018 14:57   Mr Cervical Spine W Wo Contrast  Result Date: 12/29/2018 CLINICAL DATA:  Left upper extremity weakness. EXAM: MRI CERVICAL SPINE WITHOUT AND WITH CONTRAST TECHNIQUE: Multiplanar and multiecho pulse sequences of the cervical spine, to include the craniocervical junction and cervicothoracic junction, were obtained without and with intravenous contrast. CONTRAST:  6 cc Gadavist COMPARISON:  Cervical spine CT scan from 02/05/2017 FINDINGS: Alignment: Severe degenerative cervical spondylosis with multilevel disc disease and facet disease. Multilevel degenerative subluxations are noted. 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 Vertebrae: No bone lesions or fractures. Cord: Normal MR appearance of the cervical spinal cord. No cord lesions or syrinx. No areas of abnormal cord enhancement. Posterior Fossa, vertebral arteries, paraspinal tissues: No significant findings. Disc levels: C2-3: No significant findings. C3-4: Degenerative disc disease and facet disease with a bulging annulus and osteophytic ridging. No significant spinal stenosis. Left-sided disc osteophyte complex with mild left foraminal stenosis. C4-5: Advanced degenerative disc disease with a bulging degenerated annulus, osteophytic ridging and uncinate spurring. There is flattening of the ventral thecal sac  and narrowing the ventral CSF space. No significant foraminal stenosis. C5-6: Klippel-Feil anomaly with partial fusion. No spinal or foraminal stenosis. C6-7: Advanced degenerative disc disease with a bulging degenerated annulus, osteophytic ridging and uncinate spurring. There is flattening of the ventral thecal sac and near obliteration of the ventral CSF space. Mild foraminal narrowing bilaterally due to uncinate spurring. C7-T1: Bulging annulus and facet disease but no disc protrusions or spinal stenosis. Mild bilateral foraminal stenosis. IMPRESSION: 1. Advanced degenerative cervical spondylosis with multilevel disc disease and facet disease. 2. 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 3. Normal MR appearance of the cervical spinal cord. 4. Mild spinal and bilateral foraminal stenosis at C6-7. 5. Mild left foraminal stenosis at C3-4. 6. Mild bilateral foraminal stenosis at C7-T1. Electronically Signed   By: Marijo Sanes M.D.   On: 12/29/2018 16:51   US Carotid Bilateral (at [facility] Only)  Result Date: 12/23/2018 CLINICAL DATA:  Small acute right MCA territory infarcts. History of chronic right ICA occlusion EXAM: BILATERAL CAROTID DUPLEX ULTRASOUND TECHNIQUE: Pearline Cables scale imaging, color Doppler and duplex ultrasound were performed of bilateral carotid and vertebral arteries in the neck. COMPARISON:  12/23/2018 MRI FINDINGS: Criteria: Quantification of carotid stenosis is based on velocity parameters that correlate the residual internal carotid diameter with NASCET-based stenosis levels, using the diameter of the distal internal carotid lumen as the denominator for stenosis measurement. The following velocity measurements were obtained: RIGHT ICA: Occluded cm/sec CCA: 33/4 cm/sec ECA: 283 cm/sec LEFT ICA: 174/44  cm/sec CCA: 16/10 cm/sec SYSTOLIC ICA/CCA RATIO:  3.7 ECA: 114 cm/sec RIGHT CAROTID ARTERY: Extensive right carotid atherosclerosis. CCA and ECA remain patent. Chronic occlusion of the right ICA as before.  RIGHT VERTEBRAL ARTERY:  Antegrade LEFT CAROTID ARTERY: Moderate heterogeneous calcified atherosclerosis. Slight velocity elevation measuring 174/44 centimeters/second. Mild turbulent flow and spectral broadening. Left ICA stenosis estimated at 50-69% by ultrasound criteria. LEFT VERTEBRAL ARTERY:  Antegrade IMPRESSION: Chronically occluded right ICA as before. Moderate left ICA atherosclerosis. Left ICA stenosis estimated at 50-69% Patent antegrade vertebral flow bilaterally Electronically Signed   By: Jerilynn Mages.  Shick M.D.   On: 12/23/2018 15:28   Vas US Carotid  Result Date: 12/30/2018 Carotid Arterial Duplex Study Indications:       Bilateral ICA stenosis / string sign seen on CTA neck. Risk Factors:      Hypertension, hyperlipidemia, Diabetes. Comparison Study:  Carotid artery duplex 12/23/2018, CTA head and neck 12/29/2018. Performing Technologist: Maudry Mayhew MHA, RDMS, RVT, RDCS  Examination Guidelines: A complete evaluation includes B-mode imaging, spectral Doppler, color Doppler, and power Doppler as needed of all accessible portions of each vessel. Bilateral testing is considered an integral part of a complete examination. Limited examinations for reoccurring indications may be performed as noted.  Right Carotid Findings: +----------+--------+-------+--------+------------------------+----------------+           PSV cm/sEDV    StenosisDescribe                Comments                           cm/s                                                    +----------+--------+-------+--------+------------------------+----------------+ CCA Prox  28                     smooth and heterogenous                  +----------+--------+-------+--------+------------------------+----------------+ CCA Distal30      4              heterogenous and                                                          calcific                                  +----------+--------+-------+--------+------------------------+----------------+ ICA Prox  130     14             calcific                String sign      +----------+--------+-------+--------+------------------------+----------------+ ICA Distal14                                             Thumped waveform +----------+--------+-------+--------+------------------------+----------------+ ECA       380     24                                                      +----------+--------+-------+--------+------------------------+----------------+ +----------+--------+-------+----------------+-------------------+  PSV cm/sEDV cmsDescribe        Arm Pressure (mmHG) +----------+--------+-------+----------------+-------------------+ [id number]             Multiphasic, WNL                    +----------+--------+-------+----------------+-------------------+ +---------+--------+--+--------+--+---------+ VertebralPSV cm/s85EDV cm/s11Antegrade +---------+--------+--+--------+--+---------+  Left Carotid Findings: +----------+--------+--------+--------+-----------------------+--------+           PSV cm/sEDV cm/sStenosisDescribe               Comments +----------+--------+--------+--------+-----------------------+--------+ CCA Prox  73      13                                              +----------+--------+--------+--------+-----------------------+--------+ CCA Distal69      12              smooth and heterogenous         +----------+--------+--------+--------+-----------------------+--------+ ICA Prox  263     56              calcific                        +----------+--------+--------+--------+-----------------------+--------+ ICA Distal145     29                                              +----------+--------+--------+--------+-----------------------+--------+ ECA       172                     calcific                         +----------+--------+--------+--------+-----------------------+--------+ +----------+--------+--------+----------------+-------------------+ SubclavianPSV cm/sEDV cm/sDescribe        Arm Pressure (mmHG) +----------+--------+--------+----------------+-------------------+           171             Multiphasic, WNL                    +----------+--------+--------+----------------+-------------------+ +---------+--------+--+--------+-+---------+ VertebralPSV cm/s47EDV cm/s9Antegrade +---------+--------+--+--------+-+---------+  Summary: Right Carotid: Sonographic string sign noted at origin of right internal carotid                artery, with thumped waveform of distal ICA, suggestive of distal                occlusion. This is consistent with CTA head and neck findings. Left Carotid: Elevated peak systolic velocities are suggestive of 60-79%               stenosis, however end diastolic velocities suggest upper range               40-59% stenosis. However, posterior acoustic shadowing secondary               to calcific plaque at bulb may obscure higher velocities. Vertebrals:  Bilateral vertebral arteries demonstrate antegrade flow. Subclavians: Normal flow hemodynamics were seen in bilateral subclavian              arteries. *See table(s) above for measurements and observations.  Electronically signed by Monica Martinez MD on 12/30/2018 at 3:29:37 PM.    Final     Microbiology: Recent Results (from the past  240 hour(s))  SARS Coronavirus 2 [facility] order, Performed in [facility] hospital lab)     Status: None   Collection Time: 12/29/18  7:10 PM  Result Value Ref Range Status   SARS Coronavirus 2 NEGATIVE NEGATIVE Final    Comment: (NOTE) If result is NEGATIVE SARS-CoV-2 target nucleic acids are NOT DETECTED. The SARS-CoV-2 RNA is generally detectable in upper and lower  respiratory specimens during the acute phase of infection. The lowest  concentration of SARS-CoV-2 viral copies  this assay can detect is 250  copies / mL. A negative result does not preclude SARS-CoV-2 infection  and should not be used as the sole basis for treatment or other  patient management decisions.  A negative result may occur with  improper specimen collection / handling, submission of specimen other  than nasopharyngeal swab, presence of viral mutation(s) within the  areas targeted by this assay, and inadequate number of viral copies  (<250 copies / mL). A negative result must be combined with clinical  observations, patient history, and epidemiological information. If result is POSITIVE SARS-CoV-2 target nucleic acids are DETECTED. The SARS-CoV-2 RNA is generally detectable in upper and lower  respiratory specimens dur ing the acute phase of infection.  Positive  results are indicative of active infection with SARS-CoV-2.  Clinical  correlation with patient history and other diagnostic information is  necessary to determine patient infection status.  Positive results do  not rule out bacterial infection or co-infection with other viruses. If result is PRESUMPTIVE POSTIVE SARS-CoV-2 nucleic acids MAY BE PRESENT.   A presumptive positive result was obtained on the submitted specimen  and confirmed on repeat testing.  While 2019 novel coronavirus  (SARS-CoV-2) nucleic acids may be present in the submitted sample  additional confirmatory testing may be necessary for epidemiological  and / or clinical management purposes  to differentiate between  SARS-CoV-2 and other Sarbecovirus currently known to infect humans.  If clinically indicated additional testing with an alternate test  methodology [phone]) is advised. The SARS-CoV-2 RNA is generally  detectable in upper and lower respiratory sp ecimens during the acute  phase of infection. The expected result is Negative. Fact Sheet for Patients:  StrictlyIdeas.no Fact Sheet for Healthcare  Providers: BankingDealers.co.za This test is not yet approved or cleared by the [place] FDA and has been authorized for detection and/or diagnosis of SARS-CoV-2 by FDA under an Emergency Use Authorization (EUA).  This EUA will remain in effect (meaning this test can be used) for the duration of the COVID-19 declaration under Section 564(b)(1) of the Act, 21 U.S.C. section 360bbb-3(b)(1), unless the authorization is terminated or revoked sooner. Performed at [facility], [address] [place], [place] [address]      Labs: Basic Metabolic Panel: Recent Labs  Lab 12/29/18 1541 01/03/19 0504  NA 134* 138  K 4.5 3.9  CL 100 106  CO2 23 23  GLUCOSE 595* 256*  BUN 23 19  CREATININE 0.98 0.90  CALCIUM 9.1 9.0   Liver Function Tests: Recent Labs  Lab 12/29/18 1541  AST 51*  ALT 46*  ALKPHOS 117  BILITOT 0.8  PROT 6.4*  ALBUMIN 3.4*   No results for input(s): LIPASE, AMYLASE in the last 168 hours. No results for input(s): AMMONIA in the last 168 hours. CBC: Recent Labs  Lab 12/29/18 1541  WBC 6.0  NEUTROABS 3.8  HGB 11.9*  HCT 37.4  MCV 93.7  PLT 298   Cardiac Enzymes: No results for input(s): CKTOTAL,  CKMB, CKMBINDEX, TROPONINI in the last 168 hours. BNP: BNP (last 3 results) No results for input(s): BNP in the last 8760 hours.  ProBNP (last 3 results) No results for input(s): PROBNP in the last 8760 hours.  CBG: Recent Labs  Lab 01/02/19 1912 01/02/19 2323 01/03/19 0445 01/03/19 0724 01/03/19 1237  GLUCAP 327* 107* 212* 231* 391*       Signed:  Ngai Parcell MD.  [facility] 01/03/2019, 1:12 PM

## 2019-01-03 NOTE — Care Management Important Message (Signed)
Important Message  Patient Details  Name: Danielle [name] MRN: [id number] Date of Birth: 1932-03-27   Medicare Important Message Given:  Yes     Danielle [name] [address] 01/03/2019, 2:41 PM

## 2019-01-03 NOTE — H&P (Signed)
Physical Medicine and Rehabilitation Admission H&P     Chief complaint: Weakness HPI: Danielle [name] is an 85 year old right-handed female history of hypertension, hyperlipidemia, diabetes mellitus, CVA 05/03/2018 maintained on aspirin 325 mg as well as left anterior parafalcine meningioma, she quit smoking 29 years ago, right shoulder arthroplasty.  Per chart review patient lives with spouse.  2 level home with bed and bathroom on Main level.  2 steps to entry.  Independent with assistive device prior to admission.  Presented 12/22/2020 [facility] with left upper extremity weakness x2 to 3 days.  She denied any chest pain or shortness of breath no nausea vomiting.  Cranial CT scan negative for acute changes.  Chronic microvascular ischemic changes as well as prior right MCA distribution infarct and prior right basal ganglia lacunar infarct.  Patient did not receive TPA.   Carotid Doppler showed chronically occluded right ICA as well as moderate left ICA atherosclerosis.  Left ICA stenosis estimated at 50 to 69%.  MRI/MRA showed multiple small acute right MCA infarcts as well as stable 3.7 cm left anterior parafalcine meningioma unchanged since 2019.  Echocardiogram with ejection fraction of 10% normal systolic function.  EEG negative for seizure.  COVID, hemoglobin 12.1, negative, glucose 329, hemoglobin A1c 10.3, TSH level 0.042. Patient was discharged home 12/27/2018 maintained on full-strength aspirin ambulating minimal guard.  Patient developed increased left-sided weakness and admitted to the office of Dr. Sinda Du 12/29/2018.  Follow-up MRI showed multiple new acute right MCA infarcts involving the frontal and parietal lobe.  Subacute and chronic infarcts also noted.  Patient was transferred to [facility] for further evaluation.  CTA of head and neck showed bilateral string sign stenosis of both ICA origins likely due to bulky calcification.  Vascular surgery consulted for  carotid stenosis Dr. Carlis Abbott who currently recommends no surgery at this time recommendations for outpatient follow-up after rehabilitation completed for CVA.  Plavix was added in addition to patient aspirin.  Subcutaneous Lovenox for DVT prophylaxis.  Tolerating a regular consistency diet.  Therapy evaluation completed and patient was admitted for a comprehensive rehab program.   Review of Systems  Constitutional: Negative for chills and fever.  HENT: Negative for hearing loss.   Eyes: Negative for blurred vision and double vision.  Respiratory: Negative for cough and shortness of breath.   Cardiovascular: Positive for leg swelling. Negative for chest pain and palpitations.  Gastrointestinal: Positive for constipation. Negative for heartburn, nausea and vomiting.  Genitourinary: Negative for dysuria and hematuria.  Musculoskeletal: Positive for joint pain and myalgias.  Skin: Negative for rash.  Neurological: Positive for sensory change and focal weakness. Negative for seizures.  Psychiatric/Behavioral: The patient has insomnia.   All other systems reviewed and are negative.       Past Medical History:  Diagnosis Date   Arthritis      "some joint pain once in awhile" (05/26/2017)   CVA (cerebral vascular accident) ([place]) 2019   History of kidney stones     Hyperlipemia     Hypertension     Hypothyroidism (acquired)     Pneumothorax 02/05/2017    right-after fall   Rib fractures 02/05/2017    right side   Stroke ([place])     Type II diabetes mellitus ([place])          Past Surgical History:  Procedure Laterality Date   AUGMENTATION MAMMAPLASTY Bilateral     DILATION AND CURETTAGE OF UTERUS  FRACTURE SURGERY       REVERSE SHOULDER ARTHROPLASTY Right 05/26/2017   REVERSE SHOULDER ARTHROPLASTY Right 05/26/2017    Procedure: REVERSE RIGHT SHOULDER ARTHROPLASTY;  Surgeon: Meredith Pel, MD;  Location: [facility];  Service: Orthopedics;  Laterality: Right;   TONSILLECTOMY        TUBAL LIGATION       History reviewed. No pertinent family history. Social History:  reports that she quit smoking about 29 years ago. Her smoking use included cigarettes. She has a 40.00 pack-year smoking history. She has never used smokeless tobacco. She reports that she does not drink alcohol or use drugs. Allergies:       Allergies  Allergen Reactions   Fosamax [Alendronate Sodium] Other (See Comments)      MUSCLE ACHES   Prednisone        Feels jittery and nauseous         Medications Prior to Admission  Medication Sig Dispense Refill   amLODipine (NORVASC) 5 MG tablet Take 1 tablet (5 mg total) by mouth daily. 30 tablet 11   aspirin 325 MG tablet Take 1 tablet (325 mg total) by mouth daily. 30 tablet 12   atorvastatin (LIPITOR) 40 MG tablet Take 1 tablet (40 mg total) by mouth daily at 6 PM. 30 tablet 0   Calcium Carb-Cholecalciferol (CALCIUM 600 + D PO) Take 1 tablet by mouth daily.       guaiFENesin (MUCINEX) 600 MG 12 hr tablet Take 2 tablets (1,200 mg total) by mouth 2 (two) times daily. 30 tablet 1   insulin lispro (HUMALOG) 100 UNIT/ML injection Inject 12 Units into the skin daily.        Multiple Vitamin (MULTIVITAMIN WITH MINERALS) TABS Take 1 tablet by mouth daily.        multivitamin-lutein (OCUVITE-LUTEIN) CAPS capsule Take 1 capsule by mouth daily.       Omega-3 Fatty Acids (FISH OIL) 1000 MG CAPS Take 1,000 mg by mouth daily.       SYNTHROID 100 MCG tablet Take 100 mcg by mouth every morning.        TOUJEO SOLOSTAR 300 UNIT/ML SOPN Inject 20-22 Units into the skin every morning.        vitamin B-12 (CYANOCOBALAMIN) 1000 MCG tablet Take 1,000 mcg by mouth daily.         Drug Regimen Review Drug regimen was reviewed and remains appropriate with no significant issues identified   Home: Home Living Family/patient expects to be discharged to:: Private residence Living Arrangements: Spouse/significant other Available Help at Discharge: Family,  Available 24 hours/day Type of Home: House Home Access: Stairs to enter [employer] of Steps: 2 Entrance Stairs-Rails: Right Home Layout: Two level, Able to live on main level with bedroom/bathroom Alternate Level Stairs-Number of Steps: 12 + 5 steps to go upstairs Alternate Level Stairs-Rails: Left Bathroom Shower/Tub: Tub/shower unit, Curtain Bathroom Toilet: Handicapped height Bathroom Accessibility: Yes Home Equipment: Environmental consultant - 2 wheels, Cane - single point, Bedside commode, Grab bars - tub/shower, Wheelchair - manual, Shower seat   Functional History: Prior Function Level of Independence: Independent Comments: household and short distanced community ambulator with cane as needed   Functional Status:  Mobility: Bed Mobility Overal bed mobility: Needs Assistance Bed Mobility: Sit to Supine, Supine to Sit Supine to sit: Mod assist Sit to supine: Mod assist General bed mobility comments: ModA for scooting hips forward with bed pad to seated on edge of bed, modA for return to bed to assist with  BLE negotiation back into bed Transfers Overall transfer level: Needs assistance Equipment used: 1 person hand held assist Transfers: Sit to/from Stand, Stand Pivot Transfers Sit to Stand: Mod assist Stand pivot transfers: Mod assist General transfer comment: ModA to stand with left knee blocked and pivot towards left and right. Difficulty sequencing and initiating steps Ambulation/Gait Ambulation/Gait assistance: Min assist Gait Distance (Feet): 15 Feet(x3) Assistive device: (R railing) Gait Pattern/deviations: Step-to pattern, Decreased weight shift to left, Trunk flexed, Decreased step length - right General Gait Details: MinA for gait, holding onto R railing. Tactile facilitation provided for left knee flexion during swing phase and to decrease hyperextension during stance phase. Cues for upright, decreased left lateral lean, heel strike at initial contact. Decreased  control of LLE, chair follow utilized Gait velocity: decreased Gait velocity interpretation: <1.8 ft/sec, indicate of risk for recurrent falls     ADL: ADL Overall ADL's : Needs assistance/impaired Eating/Feeding: Minimal assistance Grooming: Brushing hair Upper Body Bathing: Moderate assistance Lower Body Bathing: Moderate assistance Toilet Transfer: Moderate assistance Toilet Transfer Details (indicate cue type and reason): pt requires (A) to elevate and position R UE  Functional mobility during ADLs: Moderate assistance General ADL Comments: Pt progressed from chair to other side of the bed and back. pt demonstrates scissor gate and circumduction. pt needs rest break and to reposition LB to start again. Pt needs (A) for balance and position of R UE   Cognition: Cognition Overall Cognitive Status: Impaired/Different from baseline Orientation Level: Oriented X4 Cognition Arousal/Alertness: Awake/alert Behavior During Therapy: WFL for tasks assessed/performed Overall Cognitive Status: Impaired/Different from baseline Area of Impairment: Awareness, Safety/judgement Safety/Judgement: Decreased awareness of safety, Decreased awareness of deficits Awareness: Emergent General Comments: A&Ox4, decreased safety awareness, asking husband to assist her back to bed after education provided by PT for transfers by STAFF only   Physical Exam: Blood pressure 107/90, pulse 78, temperature 97.9 F (36.6 C), temperature source Oral, resp. rate 14, height 5\' 5"  (1.651 m), weight 60.1 kg, SpO2 100 %. Physical Exam  Constitutional: No distress.  HENT:  Head: Normocephalic and atraumatic.  Eyes: Pupils are equal, round, and reactive to light. EOM are normal.  Neck: Normal range of motion. No tracheal deviation present. No thyromegaly present.  Cardiovascular: Normal rate and regular rhythm. Exam reveals no friction rub.  No murmur heard. Respiratory: Effort normal. No respiratory distress. She  has no wheezes.  GI: Soft. She exhibits no distension. There is no abdominal tenderness.  Musculoskeletal: Normal range of motion.        General: No edema.  Neurological: She is alert. She exhibits normal muscle tone. Coordination abnormal.  alert no acute distress.  Sitting up in chair.  Makes good eye contact with examiner follows full commands.  Fair awareness of deficits. Speech sl slurred. Left central 7. RUE and RLE 4/5.  LUE 0/5. LLE 3 to 3+/5 prox to distal. Senses pain and light touch in all 4 limbs.   Skin: Skin is warm and dry. She is not diaphoretic. No erythema.  Psychiatric: She has a normal mood and affect. Her behavior is normal. Judgment and thought content normal.      Lab Results Last 48 Hours       Results for orders placed or performed during the hospital encounter of 12/29/18 (from the past 48 hour(s))  Glucose, capillary     Status: Abnormal    Collection Time: 01/01/19  8:10 AM  Result Value Ref Range    Glucose-Capillary 314 (H) 70 -  99 mg/dL  Glucose, capillary     Status: Abnormal    Collection Time: 01/01/19 11:11 AM  Result Value Ref Range    Glucose-Capillary 421 (H) 70 - 99 mg/dL  Glucose, capillary     Status: Abnormal    Collection Time: 01/01/19 12:53 PM  Result Value Ref Range    Glucose-Capillary 381 (H) 70 - 99 mg/dL  Glucose, capillary     Status: Abnormal    Collection Time: 01/01/19  4:58 PM  Result Value Ref Range    Glucose-Capillary 293 (H) 70 - 99 mg/dL  Glucose, capillary     Status: Abnormal    Collection Time: 01/01/19  8:13 PM  Result Value Ref Range    Glucose-Capillary 363 (H) 70 - 99 mg/dL  Glucose, capillary     Status: Abnormal    Collection Time: 01/02/19 12:28 AM  Result Value Ref Range    Glucose-Capillary 301 (H) 70 - 99 mg/dL  Glucose, capillary     Status: Abnormal    Collection Time: 01/02/19  4:00 AM  Result Value Ref Range    Glucose-Capillary 236 (H) 70 - 99 mg/dL  Glucose, capillary     Status: Abnormal     Collection Time: 01/02/19  7:40 AM  Result Value Ref Range    Glucose-Capillary 225 (H) 70 - 99 mg/dL    Comment 1 Notify RN      Comment 2 Document in Chart    Glucose, capillary     Status: Abnormal    Collection Time: 01/02/19 11:13 AM  Result Value Ref Range    Glucose-Capillary 407 (H) 70 - 99 mg/dL    Comment 1 Notify RN      Comment 2 Document in Chart    Glucose, capillary     Status: Abnormal    Collection Time: 01/02/19 12:43 PM  Result Value Ref Range    Glucose-Capillary 406 (H) 70 - 99 mg/dL    Comment 1 Notify RN      Comment 2 Document in Chart    Glucose, capillary     Status: Abnormal    Collection Time: 01/02/19  3:12 PM  Result Value Ref Range    Glucose-Capillary 329 (H) 70 - 99 mg/dL    Comment 1 Notify RN      Comment 2 Document in Chart    Glucose, capillary     Status: Abnormal    Collection Time: 01/02/19  7:12 PM  Result Value Ref Range    Glucose-Capillary 327 (H) 70 - 99 mg/dL  Glucose, capillary     Status: Abnormal    Collection Time: 01/02/19 11:23 PM  Result Value Ref Range    Glucose-Capillary 107 (H) 70 - 99 mg/dL  Glucose, capillary     Status: Abnormal    Collection Time: 01/03/19  4:45 AM  Result Value Ref Range    Glucose-Capillary 212 (H) 70 - 99 mg/dL     Imaging Results (Last 48 hours)  No results found.           Medical Problem List and Plan: 1.  Left-sided weakness secondary to recurrent right MCA infarct in setting of right ICA occlusion and severe left ICA stenosis.             -admit to inpatient rehab 2.  Antithrombotics: -DVT/anticoagulation: Lovenox             -antiplatelet therapy: Aspirin 325 mg daily and Plavix 75 mg daily 3. Pain Management: Tylenol as  needed 4. Mood: Provide emotional support             -antipsychotic agents: N/A 5. Neuropsych: This patient is capable of making decisions on her own behalf. 6. Skin/Wound Care: Routine skin checks 7. Fluids/Electrolytes/Nutrition: Routine in and outs with  follow-up chemistries 8. Severe left internal carotid stenosis.  Follow-up outpatient vascular surgery Dr. Carlis Abbott to consider intervention 9.  Hypertension.  Norvasc 5 mg daily.  Monitor with increased mobility 10.  Diabetes mellitus.  Hemoglobin A1c 10.3.  SSI.  Check blood sugars before meals and at bedtime.  Lantus insulin 15 units daily             -adjust regimen as needed. 11.  Hyperlipidemia.  Lipitor 12.  Hypothyroidism.  Continue Synthroid      Post Admission Physician Evaluation: 1. Functional deficits secondary  to right MCA infarct. 2. Patient is admitted to receive collaborative, interdisciplinary care between the physiatrist, rehab nursing staff, and therapy team. 3. Patient's level of medical complexity and substantial therapy needs in context of that medical necessity cannot be provided at a lesser intensity of care such as a SNF. 4. Patient has experienced substantial functional loss from his/her baseline which was documented above under the "Functional History" and "Functional Status" headings.  Judging by the patient's diagnosis, physical exam, and functional history, the patient has potential for functional progress which will result in measurable gains while on inpatient rehab.  These gains will be of substantial and practical use upon discharge  in facilitating mobility and self-care at the household level. 5. Physiatrist will provide 24 hour management of medical needs as well as oversight of the therapy plan/treatment and provide guidance as appropriate regarding the interaction of the two. 6. The Preadmission Screening has been reviewed and patient status is unchanged unless otherwise stated above. 7. 24 hour rehab nursing will assist with bladder management, bowel management, safety, skin/wound care, disease management, medication administration, pain management and patient education  and help integrate therapy concepts, techniques,education, etc. 8. PT will assess and treat  for/with: Lower extremity strength, range of motion, stamina, balance, functional mobility, safety, adaptive techniques and equipment, NMR.   Goals are: mod I to supervision. 9. OT will assess and treat for/with: ADL's, functional mobility, safety, upper extremity strength, adaptive techniques and equipment, NMR.   Goals are: supervision to min assist. Therapy may proceed with showering this patient. 10. SLP will assess and treat for/with: cognition, communication.  Goals are: supervision. 11. Case Management and Social Worker will assess and treat for psychological issues and discharge planning. 12. Team conference will be held weekly to assess progress toward goals and to determine barriers to discharge. 13. Patient will receive at least 3 hours of therapy per day at least 5 days per week. 14. ELOS: 10-14 days       15. Prognosis:  excellent   I have personally performed a face to face diagnostic evaluation of this patient and formulated the key components of the plan.  Additionally, I have personally reviewed laboratory data, imaging studies, as well as relevant notes and concur with the physician assistant's documentation above.  Meredith Staggers, MD, FAAPMR         Lavon Paganini [place], PA-C 01/03/2019

## 2019-01-03 NOTE — Care Management Important Message (Signed)
Important Message  Patient Details  Name: Danielle [name] MRN: [id number] Date of Birth: 1932/01/22   Medicare Important Message Given:  Yes     Sovereign Ramiro 01/03/2019, 2:40 PM

## 2019-01-03 NOTE — Progress Notes (Addendum)
Inpatient Diabetes Program Recommendations  AACE/ADA: New Consensus Statement on Inpatient Glycemic Control (2015)  Target Ranges:  Prepandial:   less than 140 mg/dL      Peak postprandial:   less than 180 mg/dL (1-2 hours)      Critically ill patients:  140 - 180 mg/dL   Lab Results  Component Value Date   GLUCAP 231 (H) 01/03/2019   HGBA1C 10.3 (H) 12/23/2018    Review of Glycemic Control Results for JADY, BRAGGS (MRN [id number]) as of 01/03/2019 12:58  Ref. Range 01/03/2019 04:45 01/03/2019 07:24 01/03/2019 12:37  Glucose-Capillary Latest Ref Range: 70 - 99 mg/dL 212 (H) 231 (H) 391 (H)   Diabetes history: DM 2 Outpatient Diabetes medications: Toujeo 20-22 units Daily, Humalog 12 units Daily Current orders for Inpatient glycemic control:  Lantus 15 units Novolog 0-9 units Q4 hours  A1c 10.3%, uncontrolled, patient on insulin at home. Will see patient today.  Inpatient Diabetes Program Recommendations:    Glucose trends still in 200 range. Consider Lantus 20 units (patient home dose).  Also consider Novolog 4 units tid meal coverage as glucose trends increased to 391 at lunchtime.   (Hold if patient consumes less than 50% of meal and glucose is below 80 mg/dl.)  Thanks, Tama Headings RN, MSN, BC-ADM Inpatient Diabetes Coordinator Team Pager [phone] (8a-5p)

## 2019-01-03 NOTE — Progress Notes (Signed)
Inpatient Diabetes Program   AACE/ADA: New Consensus Statement on Inpatient Glycemic Control (2015)  Target Ranges:  Prepandial:   less than 140 mg/dL      Peak postprandial:   less than 180 mg/dL (1-2 hours)      Critically ill patients:  140 - 180 mg/dL   Lab Results  Component Value Date   GLUCAP 391 (H) 01/03/2019   HGBA1C 10.3 (H) 12/23/2018    Spoke with patient about diabetes and home regimen for diabetes control. Patient reports that she is followed by her PCP Dr. Reynaldo Minium for diabetes management. Patient reports that she has had DM since the 1980's. She mentions that lately she had had a hard time with glucose control because of how brittle her glucose trends can be. Patient has had hypoglycemia in the past in the mornings. Patient on Toujeo but increases the dose when her glucose is above 200 mg/dl. Spoke with patient about her current A1c and importance of glucose control. Discussed glucose and A1c goals. Spoke with patient about following her glucose trends to see what her body's needs are while inpatient and in inpatient rehab.  Patient reports checking glucose twice a day. Husband assists with this occasionally.  Patient reports having a follow up with Dr. Reynaldo Minium next month. Encouraged patient to keep f/u appointment.  Patient verbalized understanding of information discussed and has no further questions at this time related to diabetes.  Thanks,  Tama Headings RN, MSN, BC-ADM Inpatient Diabetes Coordinator Team Pager [phone] (8a-5p)

## 2019-01-03 NOTE — Progress Notes (Signed)
Inpatient Rehabilitation  Patient information reviewed and entered into eRehab system by Maccoy Haubner M. Bueford Arp, M.A., CCC/SLP, PPS Coordinator.  Information including medical coding, functional ability and quality indicators will be reviewed and updated through discharge.    

## 2019-01-03 NOTE — Progress Notes (Signed)
Inpatient Rehabilitation Admissions Coordinator  I met with patient and her spouse at bedside to discuss goals, expectations and cost of an inpt rehab admit. They are in agreement to admit. I will make the arrangements to admit today.  Danne Baxter, RN, MSN Rehab Admissions Coordinator [phone] 01/03/2019 10:33 AM

## 2019-01-03 NOTE — Progress Notes (Signed)
Report given to 4W receiving Nurse, Ellison Carwin, LPN. Patient going to 4W 12 after finishing supper currently eaten. Husband at the bedside and patient made aware of the transfer. Elita Boone, BSN, RN

## 2019-01-03 NOTE — PMR Pre-admission (Signed)
PMR Admission Coordinator Pre-Admission Assessment  Patient: Danielle [name] is an 85 y.o., female MRN: [id number] DOB: 04-01-1932 Height: _0  (165.1 cm) Weight: 60.1 kg  Insurance Information HMO: yes    PPO:      PCP:      IPA:      80/20:      OTHER: medicare advantage plan PRIMARY: UHC Medicare      Policy#: [id number]      Subscriber: pt CM Name: Cristie Hem      Phone#: [phone] ext. Not given     Fax#: online Pre-Cert#: [id number] approved for 7 days with Princess is f/u phone [phone] ext [phone] fax [phone]      Employer:  Benefits:  Phone #: [phone]     Name: 8/15 Eff. Date: 05/19/2018     Deduct: none      Out of Pocket Max: $4500      Life Max: none CIR: $325 co pay per day days 1 until 5      SNF: no co pay days 1 until 20; $160 co pay per day days 21 until 49; no co pay days 50 until 100 Outpatient: $35 copy per visit     Co-Pay: visits per medical neccesity Home Health: 100%      Co-Pay: visits per medical neccesity DME: 80%     Co-Pay: 20% Providers: in network  Spouse to talk to his insurance man for he feels I am misquoting cost of CIR. His insurance man is quoting SNF for CIR. I explained that we are not SNF therefore cost as stated. He is na agreement to CIR admit despite his questions concerning cost.  SECONDARY: none    Medicaid Application Date:       Case Manager:  Disability Application Date:       Case Worker:   The "Data Collection Information Summary" for patients in Inpatient Rehabilitation Facilities with attached "Privacy Act [place] Records" was provided and verbally reviewed with: Family and patient  Emergency Contact Information Contact Information    Name Relation Home Work Mobile   [place] Spouse [phone]  [phone]   [facility] Daughter [phone]  [phone]   Shirleen, Mcfaul   [phone]      Current Medical History  Patient Admitting Diagnosis: CVA  History of Present Illness: 85  year old  year-old right-handed female history of hypertension, hyperlipidemia, diabetes mellitus, CVA 05/03/2018 maintained on aspirin 325 mg as well as left anterior parafalcine meningioma, she quit smoking 29 years ago, right shoulder arthroplasty.   Presented 12/22/2020 [facility] with left upper extremity weakness x2 to 3 days.  She denied any chest pain or shortness of breath no nausea vomiting.  Cranial CT scan negative for acute changes.  Chronic microvascular ischemic changes as well as prior right MCA distribution infarct and prior right basal ganglia lacunar infarct.  Patient did not receive TPA.   Carotid Doppler showed chronically occluded right ICA as well as moderate left ICA atherosclerosis.  Left ICA stenosis estimated at 50 to 69%.  MRI/MRA showed multiple small acute right MCA infarcts as well as stable 3.7 cm left anterior parafalcine meningioma unchanged since 2019.  Echocardiogram with ejection fraction of 12% normal systolic function.  EEG negative for seizure.  COVID, hemoglobin 12.1, negative, glucose 329, hemoglobin A1c 10.3, TSH level 0.042. Patient was discharged home 12/27/2018 maintained on full-strength aspirin ambulating minimal guard.  Patient developed increased left-sided weakness and admitted to the office of Dr. Sinda Du 12/29/2018.  Follow-up MRI showed multiple new acute right MCA infarcts involving the frontal and parietal lobe.  Subacute and chronic infarcts also noted.  Patient was transferred to [facility] for further evaluation.  CTA of head and neck showed bilateral string sign stenosis of both ICA origins likely due to bulky calcification.  Vascular surgery consulted for carotid stenosis Dr. Carlis Abbott who currently recommends no surgery at this time recommendations for outpatient follow-up after rehabilitation completed for CVA.  Plavix was added in addition to patient aspirin.  Subcutaneous Lovenox for DVT prophylaxis.  Tolerating a regular consistency  diet.    Complete NIHSS TOTAL: 9  Patient's medical record from [facility]  has been reviewed by the rehabilitation admission coordinator and physician.  Past Medical History  Past Medical History:  Diagnosis Date  . Arthritis    "some joint pain once in awhile" (05/26/2017)  . CVA (cerebral vascular accident) ([place]) 2019  . History of kidney stones   . Hyperlipemia   . Hypertension   . Hypothyroidism (acquired)   . Pneumothorax 02/05/2017   right-after fall  . Rib fractures 02/05/2017   right side  . Stroke ([place])   . Type II diabetes mellitus (HCC)     Family History   family history is not on file.  Prior Rehab/Hospitalizations Has the patient had prior rehab or hospitalizations prior to admission? Yes  Has the patient had major surgery during 100 days prior to admission? No   Current Medications  Current Facility-Administered Medications:  .   stroke: mapping our early stages of recovery book, , Does not apply, Once, Vas[place], Grier Mitts, MD .  acetaminophen (TYLENOL) tablet 650 mg, 650 mg, Oral, Q6H PRN, 650 mg at 01/01/19 0912 **OR** acetaminophen (TYLENOL) suppository 650 mg, 650 mg, Rectal, Q6H PRN, Jani Gravel, MD .  amLODipine (NORVASC) tablet 5 mg, 5 mg, Oral, Daily, Jani Gravel, MD, 5 mg at 01/03/19 0945 .  aspirin tablet 325 mg, 325 mg, Oral, Daily, Jani Gravel, MD, 325 mg at 01/03/19 0945 .  atorvastatin (LIPITOR) tablet 80 mg, 80 mg, Oral, q1800, Rosalin Hawking, MD, 80 mg at 01/02/19 1806 .  clopidogrel (PLAVIX) tablet 75 mg, 75 mg, Oral, Daily, Jani Gravel, MD, 75 mg at 01/03/19 0945 .  enoxaparin (LOVENOX) injection 40 mg, 40 mg, Subcutaneous, Q24H, Jani Gravel, MD, 40 mg at 01/02/19 1438 .  insulin aspart (novoLOG) injection 0-9 Units, 0-9 Units, Subcutaneous, Q4H, Jani Gravel, MD, 5 Units at 01/03/19 [id number] .  insulin glargine (LANTUS) injection 15 Units, 15 Units, Subcutaneous, Daily, Monica Becton, MD, 15 Units at 01/03/19 0945 .  levothyroxine  (SYNTHROID) tablet 100 mcg, 100 mcg, Oral, q morning - 10a, Jani Gravel, MD, 100 mcg at 01/03/19 5277 .  multivitamin (PROSIGHT) tablet 1 tablet, 1 tablet, Oral, Daily, Sinda Du, MD, 1 tablet at 01/03/19 0945 .  sodium chloride flush (NS) 0.9 % injection 3 mL, 3 mL, Intravenous, Q12H, Jani Gravel, MD, 3 mL at 01/02/19 2224 .  sodium chloride flush (NS) 0.9 % injection 3 mL, 3 mL, Intravenous, Q12H, Marty Heck, MD, 3 mL at 01/03/19 0947 .  sodium chloride flush (NS) 0.9 % injection 3 mL, 3 mL, Intravenous, PRN, Marty Heck, MD .  vitamin B-12 (CYANOCOBALAMIN) tablet 1,000 mcg, 1,000 mcg, Oral, Daily, Jani Gravel, MD, 1,000 mcg at 01/03/19 0945  Patients Current Diet:  Diet Order            Diet - low sodium heart healthy  Diet heart healthy/carb modified Room service appropriate? Yes with Assist; Fluid consistency: Thin; Fluid restriction: 1500 mL Fluid  Diet effective now              Precautions / Restrictions Precautions Precautions: Fall Precaution Comments: LUE neglect, hemiparesis Restrictions Weight Bearing Restrictions: No   Has the patient had 2 or more falls or a fall with injury in the past year? No  Prior Activity Level Limited Community (1-2x/wk): Independent before recent CVA and admit at [place]: Did the patient need help bathing, dressing, using the toilet or eating? Independent  Indoor Mobility: Did the patient need assistance with walking from room to room (with or without device)? Independent  Stairs: Did the patient need assistance with internal or external stairs (with or without device)? Independent  Functional Cognition: Did the patient need help planning regular tasks such as shopping or remembering to take medications? Needed some help  Home Assistive Devices / [place] Devices/Equipment: Shower chair with back, Bedside commode/3-in-1, Environmental consultant (specify type), Wheelchair,  CBG Meter, Cane (specify quad or straight) Home Equipment: Environmental consultant - 2 wheels, Cane - single point, Bedside commode, Grab bars - tub/shower, Wheelchair - manual, Shower seat  Prior Device Use: Indicate devices/aids used by the patient prior to current illness, exacerbation or injury? cane  Current Functional Level Cognition  Overall Cognitive Status: Impaired/Different from baseline Orientation Level: Oriented to person, Oriented to place, Oriented to situation, Disoriented to time Safety/Judgement: Decreased awareness of safety, Decreased awareness of deficits General Comments: A&Ox4, decreased safety awareness, asking husband to assist her back to bed after education provided by PT for transfers by STAFF only    Extremity Assessment (includes Sensation/Coordination)  Upper Extremity Assessment: LUE deficits/detail LUE Deficits / Details: No scapular movement, no bicep/ tricep, wrist movement, pt did flicker 2nd digit and did fincker thumb once for each during session. pt however unabel to return demo.  LUE Sensation: decreased light touch LUE Coordination: decreased fine motor, decreased gross motor  Lower Extremity Assessment: Defer to PT evaluation RLE Deficits / Details: grossly 5/5 LLE Deficits / Details: grossly 3/5    ADLs  Overall ADL's : Needs assistance/impaired Eating/Feeding: Minimal assistance Grooming: Brushing hair Upper Body Bathing: Moderate assistance Lower Body Bathing: Moderate assistance Toilet Transfer: Moderate assistance Toilet Transfer Details (indicate cue type and reason): pt requires (A) to elevate and position R UE  Functional mobility during ADLs: Moderate assistance General ADL Comments: Pt progressed from chair to other side of the bed and back. pt demonstrates scissor gate and circumduction. pt needs rest break and to reposition LB to start again. Pt needs (A) for balance and position of R UE    Mobility  Overal bed mobility: Needs Assistance Bed  Mobility: Sit to Supine, Supine to Sit Supine to sit: Mod assist Sit to supine: Mod assist General bed mobility comments: ModA for scooting hips forward with bed pad to seated on edge of bed, modA for return to bed to assist with BLE negotiation back into bed    Transfers  Overall transfer level: Needs assistance Equipment used: 1 person hand held assist Transfers: Sit to/from Stand, Stand Pivot Transfers Sit to Stand: Mod assist Stand pivot transfers: Mod assist General transfer comment: ModA to stand with left knee blocked and pivot towards left and right. Difficulty sequencing and initiating steps    Ambulation / Gait / Stairs / Wheelchair Mobility  Ambulation/Gait Ambulation/Gait assistance: Herbalist (Feet): 15  Feet(x3) Assistive device: (R railing) Gait Pattern/deviations: Step-to pattern, Decreased weight shift to left, Trunk flexed, Decreased step length - right General Gait Details: MinA for gait, holding onto R railing. Tactile facilitation provided for left knee flexion during swing phase and to decrease hyperextension during stance phase. Cues for upright, decreased left lateral lean, heel strike at initial contact. Decreased control of LLE, chair follow utilized Gait velocity: decreased Gait velocity interpretation: <1.8 ft/sec, indicate of risk for recurrent falls    Posture / Balance Dynamic Sitting Balance Sitting balance - Comments: Right lateral lean able to self correct with cues  Balance Overall balance assessment: Needs assistance Sitting-balance support: Feet supported, Single extremity supported Sitting balance-Leahy Scale: Fair Sitting balance - Comments: Right lateral lean able to self correct with cues  Postural control: Left lateral lean(forgets to use R UE for support) Standing balance support: Single extremity supported, During functional activity Standing balance-Leahy Scale: Fair Standing balance comment: railing or RW with R UE     Special needs/care consideration BiPAP/CPAP n/a CPM  N/a Continuous Drip IV  N/a Dialysis n/a Life Vest  N/a Oxygen  N/a Special Bed  N/a Trach Size  N/a Wound Vac n/a Skin MASD to buttocks Bowel mgmt:  Continent LBM 8/16 Bladder mgmt: incontinent Diabetic mgmt:  Hgb A1c 10.3 Behavioral consideration  N/a daughter report some short term memory issues pta Chemo/radiation  N/a Spouse , [name] is designated visitor   Previous Home Environment  Living Arrangements: Spouse/significant other  Lives With: Spouse Available Help at Discharge: (spouse and daughter, [name]) Type of Home: House Home Layout: Two level, Able to live on main level with bedroom/bathroom Alternate Level Stairs-Rails: Left Alternate Level Stairs-Number of Steps: 12 + 5 steps to go upstairs Home Access: Stairs to enter Entrance Stairs-Rails: Right Entrance Stairs-Number of Steps: 2 Bathroom Shower/Tub: Tub/shower unit, Architectural technologist: Handicapped height Bathroom Accessibility: Yes Home Care Services: Yes Type of Home Care Services: Home PT, Home RN [place] (if known): [facility]  Discharge Living Setting Plans for Discharge Living Setting: Patient's home, Lives with (comment)(spouse) Type of Home at Discharge: House Discharge Home Layout: Two level, Able to live on main level with bedroom/bathroom Alternate Level Stairs-Rails: Left Alternate Level Stairs-Number of Steps: 12 + 5 Discharge Home Access: Stairs to enter Entrance Stairs-Rails: Right Entrance Stairs-Number of Steps: 2 Discharge Bathroom Shower/Tub: Tub/shower unit Discharge Bathroom Toilet: Handicapped height Discharge Bathroom Accessibility: Yes How Accessible: Accessible via walker Does the patient have any problems obtaining your medications?: No  Social/Family/Support Systems Patient Roles: Spouse, Parent Contact Information: spouse [name] , main contatc but daughter, [name] wants to be kept in [place] also for  Dad gets things mixed up Anticipated Caregiver: spouse and daughter, Beth Anticipated Ambulance person Information: see above Ability/Limitations of Caregiver: [name] works as Chiropractor; spouse very [place] and gets meds mixed up and follow up care Caregiver Availability: 24/7 Discharge Plan Discussed with Primary Caregiver: Yes Is Caregiver In Agreement with Plan?: Yes Does Caregiver/Family have Issues with Lodging/Transportation while Pt is in Rehab?: No  Goals/Additional Needs Patient/Family Goal for Rehab: Mod I to superrviison with PT, sueprvision to min with OT, supervision with SLP Expected length of stay: ELOS 10 to 14 days Special Service Needs: SPouse very controling over her meds per daughter and may get things mixed up Additional Information: Will follow up after d/c with CVTS for options for Carotid enderaterectomy/ Dr. Carlis Abbott Pt/Family Agrees to Admission and willing to participate: Yes Program Orientation Provided &  Reviewed with Pt/Caregiver Including Roles  & Responsibilities: Yes  Decrease burden of Care through IP rehab admission: n/a  Possible need for SNF placement upon discharge:  not anticipated  Patient Condition: I have reviewed medical records from [facility], spoken with  patient, spouse and daughter. I met with patient at the bedside for inpatient rehabilitation assessment.  Patient will benefit from ongoing PT, OT and SLP, can actively participate in 3 hours of therapy a day 5 days of the week, and can make measurable gains during the admission.  Patient will also benefit from the coordinated team approach during an Inpatient Acute Rehabilitation admission.  The patient will receive intensive therapy as well as Rehabilitation physician, nursing, social worker, and care management interventions.  Due to bladder management, bowel management, safety, skin/wound care, disease management, medication administration, pain management and patient education the  patient requires 24 hour a day rehabilitation nursing.  The patient is currently min to mod assist with mobility and basic ADLs.  Discharge setting and therapy post discharge at home with home health is anticipated.  Patient has agreed to participate in the Acute Inpatient Rehabilitation Program and will admit today.  Preadmission Screen Completed By:  Cleatrice Burke RN MSN, 01/03/2019 11:10 AM ______________________________________________________________________   Discussed status with Dr. Naaman Plummer  on  01/03/2019 at  1108 and received approval for admission today.  Admission Coordinator:  Cleatrice Burke, RN, time  7542 Date  01/03/2019   Assessment/Plan: Diagnosis: CVA 1. Does the need for close, 24 hr/day Medical supervision in concert with the patient's rehab needs make it unreasonable for this patient to be served in a less intensive setting? Yes 2. Co-Morbidities requiring supervision/potential complications: htn, diabetes, cva 3. Due to bladder management, bowel management, safety, skin/wound care, disease management, medication administration, pain management and patient education, does the patient require 24 hr/day rehab nursing? Yes 4. Does the patient require coordinated care of a physician, rehab nurse, PT (1-2 hrs/day, 5 days/week), OT (1-2 hrs/day, 5 days/week) and SLP (1-2 hrs/day, 5 days/week) to address physical and functional deficits in the context of the above medical diagnosis(es)? Yes Addressing deficits in the following areas: balance, endurance, locomotion, strength, transferring, bowel/bladder control, bathing, dressing, feeding, grooming, toileting, cognition, speech and psychosocial support 5. Can the patient actively participate in an intensive therapy program of at least 3 hrs of therapy 5 days a week? Yes 6. The potential for patient to make measurable gains while on inpatient rehab is excellent 7. Anticipated functional outcomes upon discharge from  inpatients are: modified independent and supervision PT, supervision and min assist OT, supervision SLP 8. Estimated rehab length of stay to reach the above functional goals is: 10-14 days 9. Anticipated D/C setting: Home 10. Anticipated post D/C treatments: [place] therapy 11. Overall Rehab/Functional Prognosis: excellent  MD Signature: Meredith Staggers, MD, [facility] 01/03/2019

## 2019-01-03 NOTE — Progress Notes (Signed)
Physical Therapy Treatment Patient Details Name: Danielle [name] MRN: [id number] DOB: Jul 02, 1931 Today's Date: 01/03/2019    History of Present Illness Patient is a 85 y/o female with history of CVA presented with left UE weakness to the ED. MRI was completed on 12/23/18 showing multiple small acute right MCA infarcts. She was discharged from hospital to home on 12/27/18. had difficulties getting out of car after discharge and slid to ground, son and father had to help get her up. Per husband, they went to the ER, she was seen and sent home. They were home yesterday and husband had increased difficulty transfering patient. MD called to follow up wtih patient and wanted her admitted to hospital.    PT Comments    Patient progressing well towards PT goals. Tolerated gait training using right rail in hallway and Min A for balance. Cues needed to decreased left knee hyperextension thrust during stance phase and weight shift towards left/control of LLE. Emphasized importance of WB through Veteran during transitions from sit to/from stand. Continues to demonstrate left inattention and poor awareness. Great CIR candidate. Plans to d/c from CIR today. Will follow.    Follow Up Recommendations  CIR;Supervision/Assistance - 24 hour     Equipment Recommendations  Other (comment)(defer to CIR)    Recommendations for Other Services       Precautions / Restrictions Precautions Precautions: Fall Precaution Comments: LUE neglect, hemiparesis Restrictions Weight Bearing Restrictions: No    Mobility  Bed Mobility Overal bed mobility: Needs Assistance Bed Mobility: Supine to Sit     Supine to sit: Min assist;HOB elevated     General bed mobility comments: Assist to scoot bottom towards EOB. Use of rail, increased time and difficulty sequencing movements with LLE.  Transfers Overall transfer level: Needs assistance Equipment used: None Transfers: Sit to/from Stand Sit to Stand: Mod  assist Stand pivot transfers: Mod assist       General transfer comment: Assist to power to standing with cues for WB through LUE/LLE upon ascent/descent for equal WB through BLEs. Stood from [employer], from chair x3. SPT bed to chair with Mod A.  Ambulation/Gait Ambulation/Gait assistance: Min assist Gait Distance (Feet): 16 Feet(x3 bouts) Assistive device: (rail in hallway) Gait Pattern/deviations: Step-to pattern;Decreased weight shift to left;Trunk flexed;Decreased step length - right Gait velocity: decreased   General Gait Details: Min A for balance; holding onto right rail. Cues to increase step length LLE, decreased hyperextension during stance phase and cues for heel strike at initial contact. Decreased knee flexion during swing phase LLE. Decreased control of LLE.   Stairs             Wheelchair Mobility    Modified Rankin (Stroke Patients Only) Modified Rankin (Stroke Patients Only) Pre-Morbid Rankin Score: Moderate disability Modified Rankin: Moderately severe disability     Balance Overall balance assessment: Needs assistance Sitting-balance support: Feet supported;Single extremity supported Sitting balance-Leahy Scale: Fair     Standing balance support: During functional activity Standing balance-Leahy Scale: Fair Standing balance comment: Use of rail for support in standing. Able to find midline with equal WB through BLEs.                            Cognition Arousal/Alertness: Awake/alert Behavior During Therapy: WFL for tasks assessed/performed Overall Cognitive Status: Impaired/Different from baseline Area of Impairment: Awareness;Safety/judgement  Safety/Judgement: Decreased awareness of safety;Decreased awareness of deficits Awareness: Emergent   General Comments: A&Ox4, decreased safety awareness      Exercises      General Comments        Pertinent Vitals/Pain Pain Assessment: No/denies  pain    Home Living     Available Help at Discharge: (spouse and daughter, [name])                Prior Function            PT Goals (current goals can now be found in the care plan section) Progress towards PT goals: Progressing toward goals    Frequency    Min 4X/week      PT Plan Current plan remains appropriate    Co-evaluation              AM-PAC PT "6 Clicks" Mobility   Outcome Measure  Help needed turning from your back to your side while in a flat bed without using bedrails?: A Little Help needed moving from lying on your back to sitting on the side of a flat bed without using bedrails?: A Lot Help needed moving to and from a bed to a chair (including a wheelchair)?: A Lot Help needed standing up from a chair using your arms (e.g., wheelchair or bedside chair)?: A Little Help needed to walk in hospital room?: A Little Help needed climbing 3-5 steps with a railing? : Total 6 Click Score: 14    End of Session Equipment Utilized During Treatment: Gait belt Activity Tolerance: Patient tolerated treatment well Patient left: in chair;with call bell/phone within reach;with chair alarm set Nurse Communication: Mobility status PT Visit Diagnosis: Unsteadiness on feet (R26.81);Other abnormalities of gait and mobility (R26.89);Muscle weakness (generalized) (M62.81)     Time: 6270-3500 PT Time Calculation (min) (ACUTE ONLY): 30 min  Charges:  $Gait Training: 8-22 mins $Neuromuscular Re-education: 8-22 mins                     Wray Kearns, PT, DPT Acute Rehabilitation Services Pager [phone] Office [place] 01/03/2019, 11:24 AM

## 2019-01-03 NOTE — Plan of Care (Signed)
  Problem: RH BOWEL ELIMINATION Goal: RH STG MANAGE BOWEL WITH ASSISTANCE Description: STG Manage Bowel with Assistance. Outcome: Progressing   Problem: RH BLADDER ELIMINATION Goal: RH STG MANAGE BLADDER WITH ASSISTANCE Description: STG Manage Bladder With Assistance Outcome: Progressing   Problem: RH SAFETY Goal: RH STG ADHERE TO SAFETY PRECAUTIONS W/ASSISTANCE/DEVICE Description: STG Adhere to Safety Precautions With Assistance/Device. Outcome: Progressing

## 2019-01-04 ENCOUNTER — Inpatient Hospital Stay (HOSPITAL_COMMUNITY): Payer: Medicare Other | Admitting: Occupational Therapy

## 2019-01-04 ENCOUNTER — Inpatient Hospital Stay (HOSPITAL_COMMUNITY): Payer: Medicare Other | Admitting: Speech Pathology

## 2019-01-04 ENCOUNTER — Inpatient Hospital Stay (HOSPITAL_COMMUNITY): Payer: Medicare Other | Admitting: Physical Therapy

## 2019-01-04 LAB — COMPREHENSIVE METABOLIC PANEL
ALT: 36 U/L (ref 0–44)
AST: 51 U/L — ABNORMAL HIGH (ref 15–41)
Albumin: 2.6 g/dL — ABNORMAL LOW (ref 3.5–5.0)
Alkaline Phosphatase: 101 U/L (ref 38–126)
Anion gap: 8 (ref 5–15)
BUN: 18 mg/dL (ref 8–23)
CO2: 24 mmol/L (ref 22–32)
Calcium: 9.1 mg/dL (ref 8.9–10.3)
Chloride: 107 mmol/L (ref 98–111)
Creatinine, Ser: 0.96 mg/dL (ref 0.44–1.00)
GFR calc Af Amer: >60 mL/min (ref >60)
GFR calc non Af Amer: 54 mL/min — ABNORMAL LOW (ref >60)
Glucose, Bld: 247 mg/dL — ABNORMAL HIGH (ref 70–99)
Potassium: 3.5 mmol/L (ref 3.5–5.1)
Sodium: 139 mmol/L (ref 135–145)
Total Bilirubin: 0.7 mg/dL (ref 0.3–1.2)
Total Protein: 5.2 g/dL — ABNORMAL LOW (ref 6.5–8.1)

## 2019-01-04 LAB — CBC WITH DIFFERENTIAL/PLATELET
Abs Immature Granulocytes: 0.01 10*3/uL (ref 0.00–0.07)
Basophils Absolute: 0.1 10*3/uL (ref 0.0–0.1)
Basophils Relative: 1 %
Eosinophils Absolute: 0.3 10*3/uL (ref 0.0–0.5)
Eosinophils Relative: 5 %
HCT: 36.4 % (ref 36.0–46.0)
Hemoglobin: 12 g/dL (ref 12.0–15.0)
Immature Granulocytes: 0 %
Lymphocytes Relative: 24 %
Lymphs Abs: 1.4 10*3/uL (ref 0.7–4.0)
MCH: 30 pg (ref 26.0–34.0)
MCHC: 33 g/dL (ref 30.0–36.0)
MCV: 91 fL (ref 80.0–100.0)
Monocytes Absolute: 0.6 10*3/uL (ref 0.1–1.0)
Monocytes Relative: 10 %
Neutro Abs: 3.5 10*3/uL (ref 1.7–7.7)
Neutrophils Relative %: 60 %
Platelets: 301 10*3/uL (ref 150–400)
RBC: 4 MIL/uL (ref 3.87–5.11)
RDW: 14.6 % (ref 11.5–15.5)
WBC: 5.9 10*3/uL (ref 4.0–10.5)
nRBC: 0 % (ref 0.0–0.2)

## 2019-01-04 LAB — GLUCOSE, CAPILLARY
Glucose-Capillary: 209 mg/dL — ABNORMAL HIGH (ref 70–99)
Glucose-Capillary: 230 mg/dL — ABNORMAL HIGH (ref 70–99)
Glucose-Capillary: 231 mg/dL — ABNORMAL HIGH (ref 70–99)
Glucose-Capillary: 263 mg/dL — ABNORMAL HIGH (ref 70–99)
Glucose-Capillary: 278 mg/dL — ABNORMAL HIGH (ref 70–99)
Glucose-Capillary: 253 mg/dL — ABNORMAL HIGH (ref 70–99)
Glucose-Capillary: 270 mg/dL — ABNORMAL HIGH (ref 70–99)

## 2019-01-04 MED ORDER — INSULIN ASPART 100 UNIT/ML ~~LOC~~ SOLN
0.0000 [IU] | Freq: Every day | SUBCUTANEOUS | Status: DC
  Administered 2019-01-04: 5 [IU] via SUBCUTANEOUS
  Administered 2019-01-05: 3 [IU] via SUBCUTANEOUS
  Administered 2019-01-08 – 2019-01-20 (×9): 2 [IU] via SUBCUTANEOUS

## 2019-01-04 MED ORDER — INSULIN ASPART 100 UNIT/ML ~~LOC~~ SOLN
0.0000 [IU] | Freq: Three times a day (TID) | SUBCUTANEOUS | Status: DC
  Administered 2019-01-05: 13:00:00 5 [IU] via SUBCUTANEOUS
  Administered 2019-01-05 – 2019-01-06 (×3): 7 [IU] via SUBCUTANEOUS
  Administered 2019-01-06: 3 [IU] via SUBCUTANEOUS
  Administered 2019-01-06: 7 [IU] via SUBCUTANEOUS
  Administered 2019-01-07: 2 [IU] via SUBCUTANEOUS
  Administered 2019-01-07: 7 [IU] via SUBCUTANEOUS
  Administered 2019-01-07 – 2019-01-08 (×4): 2 [IU] via SUBCUTANEOUS
  Administered 2019-01-09: 3 [IU] via SUBCUTANEOUS
  Administered 2019-01-09: 9 [IU] via SUBCUTANEOUS
  Administered 2019-01-10 (×2): 5 [IU] via SUBCUTANEOUS
  Administered 2019-01-10: 3 [IU] via SUBCUTANEOUS
  Administered 2019-01-11: 2 [IU] via SUBCUTANEOUS
  Administered 2019-01-11: 9 [IU] via SUBCUTANEOUS
  Administered 2019-01-11: 2 [IU] via SUBCUTANEOUS
  Administered 2019-01-12: 13:00:00 3 [IU] via SUBCUTANEOUS
  Administered 2019-01-12 (×2): 2 [IU] via SUBCUTANEOUS
  Administered 2019-01-13: 1 [IU] via SUBCUTANEOUS
  Administered 2019-01-13 – 2019-01-14 (×2): 2 [IU] via SUBCUTANEOUS
  Administered 2019-01-14: 3 [IU] via SUBCUTANEOUS
  Administered 2019-01-14: 13:00:00 2 [IU] via SUBCUTANEOUS
  Administered 2019-01-15: 7 [IU] via SUBCUTANEOUS
  Administered 2019-01-15: 2 [IU] via SUBCUTANEOUS
  Administered 2019-01-16: 9 [IU] via SUBCUTANEOUS
  Administered 2019-01-16: 18:00:00 2 [IU] via SUBCUTANEOUS
  Administered 2019-01-17 (×3): 3 [IU] via SUBCUTANEOUS
  Administered 2019-01-18: 1 [IU] via SUBCUTANEOUS
  Administered 2019-01-18: 2 [IU] via SUBCUTANEOUS
  Administered 2019-01-18: 3 [IU] via SUBCUTANEOUS
  Administered 2019-01-19: 13:00:00 5 [IU] via SUBCUTANEOUS
  Administered 2019-01-19 (×2): 1 [IU] via SUBCUTANEOUS
  Administered 2019-01-20: 08:00:00 2 [IU] via SUBCUTANEOUS
  Administered 2019-01-20: 5 [IU] via SUBCUTANEOUS

## 2019-01-04 MED ORDER — INSULIN GLARGINE 100 UNIT/ML ~~LOC~~ SOLN
20.0000 [IU] | Freq: Every day | SUBCUTANEOUS | Status: DC
  Administered 2019-01-05 – 2019-01-10 (×6): 20 [IU] via SUBCUTANEOUS
  Filled 2019-01-04 (×6): qty 0.2

## 2019-01-04 NOTE — Progress Notes (Signed)
Danielle [name] PHYSICAL MEDICINE & REHABILITATION PROGRESS NOTE   Subjective/Complaints: Had a reasonable night. No pain. Left arm still weak. Anxious to start with therapies  ROS: Patient denies fever, rash, sore throat, blurred vision, nausea, vomiting, diarrhea, cough, shortness of breath or chest pain, joint or back pain, headache, or mood change.   Objective:   No results found. Recent Labs    01/03/19 1822 01/04/19 0530  WBC 6.3 5.9  HGB 12.5 12.0  HCT 38.8 36.4  PLT 309 301   Recent Labs    01/03/19 0504 01/03/19 1822 01/04/19 0530  NA 138  --  139  K 3.9  --  3.5  CL 106  --  107  CO2 23  --  24  GLUCOSE 256*  --  247*  BUN 19  --  18  CREATININE 0.90 1.06* 0.96  CALCIUM 9.0  --  9.1    Intake/Output Summary (Last 24 hours) at 01/04/2019 1220 Last data filed at 01/04/2019 0900 Gross per 24 hour  Intake 120 ml  Output -  Net 120 ml     Physical Exam: Vital Signs Blood pressure (!) 133/50, pulse 71, temperature 98 F (36.7 C), temperature source Oral, resp. rate 18, height 5\' 4"  (1.626 m), weight 59.2 kg, SpO2 99 %.  Constitutional: No distress . Vital signs reviewed. HEENT: EOMI, oral membranes moist Neck: supple Cardiovascular: RRR without murmur. No JVD    Respiratory: CTA Bilaterally without wheezes or rales. Normal effort    GI: BS +, non-tender, non-distended   Musculoskeletal:Normal range of motion.  General: No edema.  Neurological: She isalert. And oriented x 3. Left C7. RUE and RLE 4/5. LUE 0/5. LLE remains 3 to 3+/5 prox to distal. Normal pain and LT in all 4's.  Skin: Skin iswarmand dry. She isnot diaphoretic. Noerythema.  Psychiatric: normal insight and awareness   Assessment/Plan: 1. Functional deficits secondary to right MCA infarct which require 3+ hours per day of interdisciplinary therapy in a comprehensive inpatient rehab setting.  Physiatrist is providing close team supervision and 24 hour management of active medical  problems listed below.  Physiatrist and rehab team continue to assess barriers to discharge/monitor patient progress toward functional and medical goals  Care Tool:  Bathing    Body parts bathed by patient: Left arm, Abdomen, Chest, Front perineal area, Face   Body parts bathed by helper: Right arm, Buttocks, Right upper leg, Left lower leg, Right lower leg, Left upper leg     Bathing assist Assist Level: Maximal Assistance - Patient 24 - 49%     Upper Body Dressing/Undressing Upper body dressing   What is the patient wearing?: Button up shirt    Upper body assist Assist Level: Moderate Assistance - Patient 50 - 74%    Lower Body Dressing/Undressing Lower body dressing      What is the patient wearing?: Pants, Incontinence brief     Lower body assist Assist for lower body dressing: Maximal Assistance - Patient 25 - 49%     Toileting Toileting    Toileting assist Assist for toileting: Moderate Assistance - Patient 50 - 74%     Transfers Chair/bed transfer  Transfers assist     Chair/bed transfer assist level: Minimal Assistance - Patient > 75%     Locomotion Ambulation   Ambulation assist              Walk 10 feet activity   Assist  Walk 50 feet activity   Assist           Walk 150 feet activity   Assist           Walk 10 feet on uneven surface  activity   Assist           Wheelchair     Assist               Wheelchair 50 feet with 2 turns activity    Assist            Wheelchair 150 feet activity     Assist          Blood pressure (!) 133/50, pulse 71, temperature 98 F (36.7 C), temperature source Oral, resp. rate 18, height 5\' 4"  (1.626 m), weight 59.2 kg, SpO2 99 %.  Medical Problem List and Plan: 1.Left-sided weaknesssecondary to recurrent right MCA infarct in setting of right ICA occlusion and severe left ICA stenosis. --Patient is beginning CIR  therapies today including PT, OT, and SLP   -ELOS 14 days 2. Antithrombotics: -DVT/anticoagulation:Lovenox -antiplatelet therapy: Aspirin 325 mg daily and Plavix 75 mg daily 3. Pain Management:Tylenol as needed 4. Mood:Provide emotional support -antipsychotic agents: N/A 5. Neuropsych: This patientiscapable of making decisions on herown behalf. 6. Skin/Wound Care:Routine skin checks 7. Fluids/Electrolytes/Nutrition: encourage PO  -I personally reviewed the patient's labs today.   8. Severe left internalcarotid stenosis. Follow-up outpatient vascular surgery Dr. Carlis Abbott to consider intervention 9. Hypertension. Norvasc 5 mg daily. Controlled at present 10. Diabetes mellitus. Hemoglobin A1c 10.3. SSI. Check blood sugars before meals and at bedtime. Lantus insulin 15 units daily  -poorly controlled  -adjust lantus to 20u CBG (last 3)  Recent Labs    01/03/19 0724 01/03/19 1237 01/03/19 1708  GLUCAP 231* 391* 291*    -adjust regimen as needed. 11. Hyperlipidemia. Lipitor 12. Hypothyroidism. Continue Synthroid     LOS: 1 days A FACE TO [place] 01/04/2019, 12:20 PM

## 2019-01-04 NOTE — Evaluation (Signed)
Physical Therapy Assessment and Plan  Patient Details  Name: Danielle [name] MRN: [id number] Date of Birth: Sep 04, 1931  PT Diagnosis: Abnormal posture, Abnormality of gait, Coordination disorder, Difficulty walking, Hemiplegia non-dominant and Muscle weakness Rehab Potential: Good ELOS: 12-14 days   Today's Date: 01/04/2019 PT Individual Time: 1300-1430 PT Individual Time Calculation (min): 90 min    Problem List:  Patient Active Problem List   Diagnosis Date Noted  . Right middle cerebral artery stroke ([place]) 01/03/2019  . Carotid stenosis, bilateral 12/30/2018  . Stroke ([place]) 12/24/2018  . Stroke-like symptoms 12/23/2018  . HCAP (healthcare-associated pneumonia) 06/09/2018  . Severe sepsis ([place]) 06/09/2018  . Generalized weakness 06/09/2018  . Right middle lobe pneumonia ([place]) 06/08/2018  . Acute CVA (cerebrovascular accident) ([place]) 05/03/2018  . Hypothyroidism (acquired) 05/03/2018  . Hypertension 05/03/2018  . Uncontrolled type 2 diabetes mellitus with diabetic nephropathy, with long-term current use of insulin ([place])   . UTI due to extended-spectrum beta lactamase (ESBL) producing Escherichia coli   . Pneumothorax 02/05/2017  . Multiple rib fractures 02/05/2017  . Type 2 diabetes mellitus with hypoglycemia without coma ([place]) 02/05/2017  . Clavicle fracture 02/05/2017  . Elevated blood pressure reading 02/05/2017  . UTI (urinary tract infection) 02/05/2017  . Back pain at L4-L5 level 06/14/2013  . Muscle weakness (generalized) 12/17/2012  . Pain in joint, shoulder region 12/17/2012  . Shoulder fracture 12/14/2012  . Proximal humerus fracture 11/29/2012  . FRACTURE, TOE, RIGHT 09/10/2009    Past Medical History:  Past Medical History:  Diagnosis Date  . Arthritis    "some joint pain once in awhile" (05/26/2017)  . CVA (cerebral vascular accident) ([place]) 2019  . History of kidney stones   . Hyperlipemia   . Hypertension   . Hypothyroidism (acquired)   .  Pneumothorax 02/05/2017   right-after fall  . Rib fractures 02/05/2017   right side  . Stroke ([place])   . Type II diabetes mellitus ([place])    Past Surgical History:  Past Surgical History:  Procedure Laterality Date  . AUGMENTATION MAMMAPLASTY Bilateral   . DILATION AND CURETTAGE OF UTERUS    . FRACTURE SURGERY    . REVERSE SHOULDER ARTHROPLASTY Right 05/26/2017  . REVERSE SHOULDER ARTHROPLASTY Right 05/26/2017   Procedure: REVERSE RIGHT SHOULDER ARTHROPLASTY;  Surgeon: Meredith Pel, MD;  Location: [place];  Service: Orthopedics;  Laterality: Right;  . TONSILLECTOMY    . TUBAL LIGATION      Assessment & Plan Clinical Impression: Patient is a 85 year old right-handed female history of hypertension, hyperlipidemia, diabetes mellitus, CVA 05/03/2018 maintained on aspirin 325 mg as well as left anterior parafalcine meningioma, she quit smoking 29 years ago, right shoulder arthroplasty. Per chart review patient lives with spouse. 2 level home with bed and bathroom on Main level. 2 steps to entry. Independent with assistive device prior to admission. Presented [facility]/2020 [facility] with left upper extremity weakness x2 to 3 days. She denied any chest pain or shortness of breath no nausea vomiting. Cranial CT scan negative for acute changes. Chronic microvascular ischemic changes as well as prior right MCA distribution infarct and prior right basal ganglia lacunar infarct. Patient did not receive TPA. Carotid Doppler showed chronically occluded right ICA as well as moderate left ICA atherosclerosis. Left ICA stenosis estimated at 50 to 69%. MRI/MRA showed multiple small acute right MCA infarcts as well as stable 3.7 cm left anterior parafalcine meningioma unchanged since 2019. Echocardiogram with ejection fraction of 99% normal systolic function. EEG negative  for seizure. COVID, hemoglobin 12.1, negative, glucose 329, hemoglobin A1c 10.3, TSH level 0.042. Patient was discharged  home 12/27/2018 maintained on full-strength aspirin ambulating minimal guard. Patient developed increased left-sided weakness and admitted to the office of Dr. Sinda Du 12/29/2018. Follow-up MRI showed multiple new acute right MCA infarcts involving the frontal and parietal lobe. Subacute and chronic infarcts also noted. Patient was [facility]. CTA of head and neck showed bilateral string sign stenosis of both ICA origins likely due to bulky calcification. Vascular surgery consulted for carotid stenosis Dr. Carlis Abbott who currently recommends no surgery at this time recommendations for outpatient follow-up after rehabilitation completed for CVA. Plavix was added in addition to patient aspirin. Subcutaneous Lovenox for DVT prophylaxis. Tolerating a regular consistency diet. Therapy evaluation completed and patient was admitted for a comprehensive rehab program. Patient transferred to CIR on 01/03/2019 .   Patient currently requires mod with mobility secondary to muscle weakness, decreased cardiorespiratoy endurance, decreased coordination and decreased sitting balance, decreased standing balance, decreased postural control, hemiplegia and decreased balance strategies.  Prior to hospitalization, patient was modified independent  with mobility and PRN assist for ADLs/meal prep and lived with Spouse in a House home.  Home access is 2 steps to enter back doorStairs to enter.  Patient will benefit from skilled PT intervention to maximize safe functional mobility, minimize fall risk and decrease caregiver burden for planned discharge home with 24 hour assist.  Anticipate patient will benefit from follow up [facility] at discharge.  PT - End of Session Activity Tolerance: Tolerates < 10 min activity, no significant change in vital signs Endurance Deficit: Yes Endurance Deficit Description: decreased PT Assessment Rehab Potential (ACUTE/IP ONLY): Good PT Barriers to  Discharge: Medical stability PT Barriers to Discharge Comments: pt w/ many recent strokes PT Patient demonstrates impairments in the following area(s): Balance;Behavior;Endurance;Motor;Safety PT Transfers Functional Problem(s): Bed Mobility;Bed to Chair;Furniture;Car;Floor PT Locomotion Functional Problem(s): Stairs;Ambulation PT Plan PT Intensity: Minimum of 1-2 x/day ,45 to 90 minutes PT Frequency: 5 out of 7 days PT Duration Estimated Length of Stay: 12-14 days PT Treatment/Interventions: Ambulation/gait training;Functional mobility training;Discharge planning;Psychosocial support;Therapeutic Activities;Therapeutic Exercise;Skin care/wound management;Neuromuscular re-education;Disease management/prevention;Balance/vestibular training;DME/adaptive equipment instruction;Splinting/orthotics;UE/LE Strength taining/ROM;Pain management;Community reintegration;Functional electrical stimulation;Patient/family education;Stair training;UE/LE Coordination activities PT Transfers Anticipated Outcome(s): supervision PT Locomotion Anticipated Outcome(s): CGA gait in household environment w/ LRAD PT Recommendation Follow Up Recommendations: Home health PT Patient destination: Home Equipment Recommended: To be determined Equipment Details: has SPC, standard walker, and rollator  Skilled Therapeutic Intervention  Pt in w/c and agreeable to therapy, no c/o pain. Pt's lunch tray had just arrived. Discussed PLOF information and home set-up while she finish eating lunch. Performed functional mobility as detailed below including transfers, gait, stair negotiation, and bed mobility. Additionally performed car transfer w/ min assist. Switched pt's w/c to 16x16 w/ improved upright sitting posture and added cushion. Intermittent seated rest breaks 2/2 fatigue, pt also moves at a very slow and cautious pace. Instructed pt in results of PT evaluation as detailed below, PT POC, rehab potential, rehab goals, and discharge  recommendations. Called daughter [name]) who confirmed PLOF and home set-up information. Educated her too on results of eval, PT POC, and goals/discharge recommendations. Daughter confirms she will be there to provide assist w/ bathing, stair negotiation, and car transfers. States pt's husband could provide assist for the rest. Ended session in supine, all needs in reach.  PT Evaluation Precautions/Restrictions Precautions Precautions: None Precaution Comments: L hemiparesis Restrictions Weight Bearing Restrictions:  No Home Living/Prior Functioning Home Living Available Help at Discharge: Family;Available 24 hours/day(daughter and spouse to provide 24/7 assist, daughter lives 5 min away, husband has COPD and is unable to provide much physical assist) Type of Home: House Home Access: Stairs to enter [employer] of Steps: 2 steps to enter back door Entrance Stairs-Rails: Right Home Layout: Two level;Bed/bath upstairs Alternate Level Stairs-Number of Steps: 12 + 5 steps to go upstairs Alternate Level Stairs-Rails: Left Bathroom Shower/Tub: Tub/shower unit;Curtain Bathroom Toilet: Handicapped height Bathroom Accessibility: Yes Additional Comments: Pt states she has a regular walker, rollator, cane, and a BSC  Lives With: Spouse Prior Function Level of Independence: Independent with gait;Needs assistance with homemaking;Independent with transfers;Needs assistance with ADLs(pt reports she did not use AD prior to the most recent set of strokes, however pt told other therapist she alternated between [facility] and rollator)  Able to Take Stairs?: Yes Driving: Yes(prior to most recent set of strokes) Vocation: Retired Comments: Reports her husband helped her out w/ bathing/dressing and meal prep occasionally. History of multiple falls at home. Husband would often walk close to her to make sure she was safe. Vision/Perception  Perception Perception: Within Functional Limits Praxis Praxis:  Intact  Cognition Overall Cognitive Status: Within Functional Limits for tasks assessed Arousal/Alertness: Awake/alert Orientation Level: Oriented X4 Memory: Appears intact Immediate Memory Recall: Bed;Blue;Sock Memory Recall Sock: Without Cue Memory Recall Blue: Without Cue Memory Recall Bed: With Cue Awareness: Appears intact Problem Solving: Appears intact Safety/Judgment: Appears intact Sensation Sensation Light Touch: Appears Intact Central sensation comments: 1 Light Touch Impaired Details: Impaired LUE;Impaired LLE Coordination Gross Motor Movements are Fluid and Coordinated: No Fine Motor Movements are Fluid and Coordinated: No Coordination and Movement Description: LLE impaired 2/2 weakness Finger Nose Finger Test: WFL on R, UTA on L 2/2 hemiplegia Motor  Motor Motor: Hemiplegia Motor - Skilled Clinical Observations: L hemi, UE>LE  Mobility Bed Mobility Bed Mobility: Rolling Left;Rolling Right Rolling Right: Minimal Assistance - Patient > 75% Rolling Left: Minimal Assistance - Patient > 75% Supine to Sit: Minimal Assistance - Patient > 75% Sit to Supine: Minimal Assistance - Patient > 75% Transfers Transfers: Stand to Sit;Sit to Stand;Stand Pivot Transfers Sit to Stand: Minimal Assistance - Patient > 75% Stand to Sit: Minimal Assistance - Patient > 75% Stand Pivot Transfers: Minimal Assistance - Patient > 75% Stand Pivot Transfer Details: Manual facilitation for weight shifting;Verbal cues for technique;Tactile cues for initiation;Tactile cues for sequencing Transfer (Assistive device): None Locomotion  Gait Ambulation: Yes Gait Assistance: Moderate Assistance - Patient 50-74% Gait Distance (Feet): 15 Feet Assistive device: Rolling walker Gait Assistance Details: Manual facilitation for weight shifting;Tactile cues for initiation;Tactile cues for sequencing;Manual facilitation for placement;Verbal cues for safe use of DME/AE Gait Gait: Yes Gait Pattern:  Impaired Gait Pattern: Decreased stride length;Decreased step length - left;Decreased stance time - left;Trunk flexed Stairs / Additional Locomotion Stairs: Yes Stairs Assistance: Moderate Assistance - Patient 50 - 74% Stair Management Technique: One rail Right Number of Stairs: 3 Height of Stairs: 6 Curb: Moderate Assistance - Patient 50 - 74% Wheelchair Mobility Wheelchair Mobility: No  Trunk/Postural Assessment  Cervical Assessment Cervical Assessment: Exceptions to WFL(forward head/rounded shoulder posture) Thoracic Assessment Thoracic Assessment: Within Functional Limits Lumbar Assessment Lumbar Assessment: Exceptions to WFL(posterior pelvic tilt) Postural Control Postural Control: Deficits on evaluation(delayed/insufficient)  Balance Balance Balance Assessed: Yes Static Sitting Balance Static Sitting - Balance Support: Feet supported;No upper extremity supported Static Sitting - Level of Assistance: 5: Stand by assistance Dynamic Sitting Balance  Dynamic Sitting - Balance Support: No upper extremity supported;Feet supported Dynamic Sitting - Level of Assistance: 4: Min assist Sitting balance - Comments: L lateral LOB Static Standing Balance Static Standing - Balance Support: During functional activity;Right upper extremity supported Static Standing - Level of Assistance: 4: Min assist Dynamic Standing Balance Dynamic Standing - Balance Support: Right upper extremity supported;During functional activity Dynamic Standing - Level of Assistance: 3: Mod assist Extremity Assessment  RUE Assessment RUE Assessment: Exceptions to [facility] Active Range of Motion (AROM) Comments: limited shoulder ROM 0-90 2/2 previous TSA  LUE Assessment LUE Assessment: Exceptions to [facility] LUE Body System: Neuro Brunstrum levels for arm and hand: Arm;Hand Brunstrum level for arm: Stage I Presynergy Brunstrum level for hand: Stage I Flaccidity RLE Assessment RLE Assessment: Within Functional  Limits LLE Assessment LLE Assessment: Exceptions to [facility] Passive Range of Motion (PROM) Comments: WFL General Strength Comments: Globally 3+ to 4-/5    Refer to Care Plan for Long Term Goals  Recommendations for other services: None   Discharge Criteria: Patient will be discharged from PT if patient refuses treatment 3 consecutive times without medical reason, if treatment goals not met, if there is a change in medical status, if patient makes no progress towards goals or if patient is discharged from hospital.  The above assessment, treatment plan, treatment alternatives and goals were discussed and mutually agreed upon: by patient and by family  Danielle [name] 01/04/2019, 3:09 PM

## 2019-01-04 NOTE — Evaluation (Signed)
Occupational Therapy Assessment and Plan  Patient Details  Name: Danielle [name] MRN: [id number] Date of Birth: 1931-08-02  OT Diagnosis: flaccid hemiplegia and hemiparesis and muscle weakness (generalized) Rehab Potential: Rehab Potential (ACUTE ONLY): Excellent ELOS: 12-14 days   Today's Date: 01/04/2019  Session 1 OT Individual Time: 9169-4503 OT Individual Time Calculation (min): 56 min     Session 2 OT Individual Time: 8882-8003 OT Individual Time Calculation (min): 55 min     Problem List:  Patient Active Problem List   Diagnosis Date Noted  . Right middle cerebral artery stroke ([place]) 01/03/2019  . Carotid stenosis, bilateral 12/30/2018  . Stroke ([place]) 12/24/2018  . Stroke-like symptoms 12/23/2018  . HCAP (healthcare-associated pneumonia) 06/09/2018  . Severe sepsis ([place]) 06/09/2018  . Generalized weakness 06/09/2018  . Right middle lobe pneumonia ([place]) 06/08/2018  . Acute CVA (cerebrovascular accident) ([place]) 05/03/2018  . Hypothyroidism (acquired) 05/03/2018  . Hypertension 05/03/2018  . Uncontrolled type 2 diabetes mellitus with diabetic nephropathy, with long-term current use of insulin ([place])   . UTI due to extended-spectrum beta lactamase (ESBL) producing Escherichia coli   . Pneumothorax 02/05/2017  . Multiple rib fractures 02/05/2017  . Type 2 diabetes mellitus with hypoglycemia without coma ([place]) 02/05/2017  . Clavicle fracture 02/05/2017  . Elevated blood pressure reading 02/05/2017  . UTI (urinary tract infection) 02/05/2017  . Back pain at L4-L5 level 06/14/2013  . Muscle weakness (generalized) 12/17/2012  . Pain in joint, shoulder region 12/17/2012  . Shoulder fracture 12/14/2012  . Proximal humerus fracture 11/29/2012  . FRACTURE, TOE, RIGHT 09/10/2009    Past Medical History:  Past Medical History:  Diagnosis Date  . Arthritis    "some joint pain once in awhile" (05/26/2017)  . CVA (cerebral vascular accident) ([place]) 2019  . History of kidney  stones   . Hyperlipemia   . Hypertension   . Hypothyroidism (acquired)   . Pneumothorax 02/05/2017   right-after fall  . Rib fractures 02/05/2017   right side  . Stroke ([place])   . Type II diabetes mellitus ([place])    Past Surgical History:  Past Surgical History:  Procedure Laterality Date  . AUGMENTATION MAMMAPLASTY Bilateral   . DILATION AND CURETTAGE OF UTERUS    . FRACTURE SURGERY    . REVERSE SHOULDER ARTHROPLASTY Right 05/26/2017  . REVERSE SHOULDER ARTHROPLASTY Right 05/26/2017   Procedure: REVERSE RIGHT SHOULDER ARTHROPLASTY;  Surgeon: Meredith Pel, MD;  Location: [facility];  Service: Orthopedics;  Laterality: Right;  . TONSILLECTOMY    . TUBAL LIGATION      Assessment & Plan Clinical Impression: Patient is a 85 y.o. year old female   with history of CVA presented with left UE weakness to the ED. MRI was completed on 12/23/18 showing multiple small acute right MCA infarcts. She was discharged from hospital to home on 12/27/18. had difficulties getting out of car after discharge and slid to ground, son and father had to help get her up. Per husband, they went to the ER, she was seen and sent home. They were home yesterday and husband had increased difficulty transfering patient. MD called to follow up wtih patient and wanted her admitted to hospital.  Patient transferred to CIR on 01/03/2019 .    Patient currently requires mod/max with basic self-care skills secondary to muscle weakness, decreased endurance and decreased sitting balance, decreased standing balance, decreased postural control, hemiplegia and decreased balance strategies.  Prior to hospitalization, patient could complete BADL with min A/ supervision.  Patient  will benefit from skilled intervention to increase independence with basic self-care skills prior to discharge home with care partner.  Anticipate patient will require 24 hour supervision and follow up home health.  OT - End of Session Endurance Deficit:  Yes Endurance Deficit Description: rest breaks within BADL tasks OT Assessment Rehab Potential (ACUTE ONLY): Excellent OT Patient demonstrates impairments in the following area(s): Balance;Endurance;Motor;Perception;Safety;Sensory OT Basic ADL's Functional Problem(s): Eating;Dressing;Grooming;Bathing;Toileting OT Transfers Functional Problem(s): Tub/Shower;Toilet OT Additional Impairment(s): Fuctional Use of Upper Extremity OT Plan OT Intensity: Minimum of 1-2 x/day, 45 to 90 minutes OT Frequency: 5 out of 7 days OT Duration/Estimated Length of Stay: 12-14 days OT Treatment/Interventions: Medical illustrator training;Community reintegration;Discharge planning;DME/adaptive equipment instruction;Functional electrical stimulation;Functional mobility training;Neuromuscular re-education;Patient/family education;Psychosocial support;Self Care/advanced ADL retraining;Splinting/orthotics;Therapeutic Activities;Therapeutic Exercise;UE/LE Strength taining/ROM;UE/LE Coordination activities;Wheelchair propulsion/positioning OT Self Feeding Anticipated Outcome(s): Set-up A OT Basic Self-Care Anticipated Outcome(s): Min A/supervision OT Toileting Anticipated Outcome(s): Supervision OT Bathroom Transfers Anticipated Outcome(s): Supervision OT Recommendation Patient destination: Home Follow Up Recommendations: Home health OT;24 hour supervision/assistance Equipment Recommended: To be determined   Skilled Therapeutic Intervention Session 1 Initial eval completed with treatment provided to address functional transfers, functional use of L UE, improved sit<>stand, standing tolerance, and adapted bathing/dressing skills. Pt up seated in recliner when OT arrived and agreeable to OT eval and treatment session. Stand-pivot transfers bed>wc>>toilet>shower chair with overall Min A. Incorporated L NMR weight bearing techniques within bathing tasks with hand over hand A to integrate L UE into bathing tasks. Pt with  lateral  Lean to the L in shower requiring min/mod A at times for dynamic sitting balance.  Min/mod A for balance when reaching to wash buttocks. LB/UB dressing completed wc level at the sink with assistance to thread L LE into clothing and assist to pull up pants. UB dressing with mod A overall 2/2 L hemiplegia. Incorporate weight bearing through L UE when standing at the sink. Pt left seated in recliner at end of session with safety belt on and call bell in reach.   Session 2 Pt greeted semi-reclined in bed voicing concerns about her approved visitor and policy. Pt stated her husband was not feeling well and could not come up here today, so she wanted to change visitor to her daughter. OT educated on visitor policy and pt stated she had already spoken to someone to see if her approved visitor could be changed. Pt came to sitting EOB with increased time and min A. Stand-pivot to wc on R side with min A, but 1 posterior LOB requiring mod A to correct during transfer. OT provided pt with L 1/2 lap tray and educated on importance of supporting L UE 2/2 subluxation. Dr. Naaman Plummer verbally stated no contraindications for NMES, so pt brought to therapy gym in wc for NMES and NMR on L UE. Applied 1:1 NMES to CH1 supraspinatus and middle deltoid to help approximate shoulder joint, and Ch 2 wrist extensors.  Ratio 1:1 Rate 35 pps Waveform- Asymmetric Ramp 1.0 Pulse 300 CH1 Intensity- 19  Duration -  15  CH2 Intensity- 21 Duration -  15  Pt returned to room at end of session and left seated in wc with L UE supported on lap tray, alarm belt on, and call bell in reach.   OT Evaluation Precautions/Restrictions  Precautions Precautions: Fall Precaution Comments: L hemiparesis Restrictions Weight Bearing Restrictions: No Pain Pain Assessment Pain Scale: 0-10 Pain Score: 0-No pain Home Living/Prior Functioning Home Living Family/patient expects to be discharged to:: Private residence Living  Arrangements:  Spouse/significant other Available Help at Discharge: Family, Available 24 hours/day Type of Home: House Home Access: Stairs to enter [employer] of Steps: 2 Entrance Stairs-Rails: Left Home Layout: Two level, Able to live on main level with bedroom/bathroom Alternate Level Stairs-Number of Steps: 12 + 5 steps to go upstairs Alternate Level Stairs-Rails: Left Bathroom Shower/Tub: Tub/shower unit, Architectural technologist: Handicapped height Bathroom Accessibility: Yes Additional Comments: Pt states she has a regular walker, rollator, cane, and a BSC  Lives With: Spouse IADL History Homemaking Responsibilities: Yes IADL Comments: Pt reports she enjoyed mostly watching TV. She stated she did not do much after her stroke in Dec 2019 Prior Function Level of Independence: Needs assistance with ADLs, Needs assistance with homemaking, Independent with gait(was using 3 prong cane PTA, daughter assisted with bathing) Driving: No Comments: Was not driving anymore after stroke, but was going grocery shopping with husband and did some cooking and laundry ADL ADL Eating: Minimal assistance Grooming: Minimal assistance Upper Body Bathing: Moderate assistance Lower Body Bathing: Maximal assistance Upper Body Dressing: Moderate assistance Lower Body Dressing: Maximal assistance Toileting: Moderate assistance Toilet Transfer: Minimal assistance Toilet Transfer Method: Stand pivot Tub/Shower Transfer: Moderate assistance Tub/Shower Equipment: Radio broadcast assistant Vision Baseline Vision/History: No visual deficits Patient Visual Report: No change from baseline Perception  Perception: Within Functional Limits Praxis Praxis: Intact Cognition Overall Cognitive Status: Within Functional Limits for tasks assessed Arousal/Alertness: Awake/alert Orientation Level: Person;Place;Situation Person: Oriented Place: Oriented Situation: Oriented Year: 2020 Month: August Day of  Week: Correct Memory: Appears intact Immediate Memory Recall: Bed;Blue;Sock Memory Recall Sock: Without Cue Memory Recall Blue: Without Cue Memory Recall Bed: With Cue Awareness: Appears intact Problem Solving: Appears intact Safety/Judgment: Appears intact Sensation Sensation Light Touch: Impaired Detail Central sensation comments: 1 Light Touch Impaired Details: Impaired LUE;Impaired LLE Coordination Gross Motor Movements are Fluid and Coordinated: No Fine Motor Movements are Fluid and Coordinated: No Coordination and Movement Description: decreased smoothness in movements 2/2 hemiplegia Finger Nose Finger Test: WFL on R, UTA on L 2/2 hemiplegia Motor  Motor Motor: Hemiplegia Mobility  Bed Mobility Bed Mobility: Supine to Sit;Sit to Supine Supine to Sit: Minimal Assistance - Patient > 75% Sit to Supine: Minimal Assistance - Patient > 75%  Balance Balance Balance Assessed: Yes Static Sitting Balance Static Sitting - Balance Support: Feet supported Static Sitting - Level of Assistance: 5: Stand by assistance Dynamic Sitting Balance Dynamic Sitting - Balance Support: Feet supported Dynamic Sitting - Level of Assistance: 4: Min assist;3: Mod assist Sitting balance - Comments: L lateral LOB Static Standing Balance Static Standing - Balance Support: During functional activity Static Standing - Level of Assistance: 4: Min assist Dynamic Standing Balance Dynamic Standing - Balance Support: During functional activity Dynamic Standing - Level of Assistance: 3: Mod assist Extremity/Trunk Assessment RUE Assessment RUE Assessment: Exceptions to [facility] Active Range of Motion (AROM) Comments: limited shoulder ROM 0-90 2/2 previous TSA  LUE Assessment LUE Assessment: Exceptions to [facility] LUE Body System: Neuro Brunstrum levels for arm and hand: Arm;Hand Brunstrum level for arm: Stage I Presynergy Brunstrum level for hand: Stage I Flaccidity   Refer to Care Plan for Long Term  Goals  Recommendations for other services: None    Discharge Criteria: Patient will be discharged from OT if patient refuses treatment 3 consecutive times without medical reason, if treatment goals not met, if there is a change in medical status, if patient makes no progress towards goals or if patient is discharged from hospital.  The above assessment,  treatment plan, treatment alternatives and goals were discussed and mutually agreed upon: by patient  Valma Cava 01/04/2019, 12:48 PM

## 2019-01-04 NOTE — Progress Notes (Signed)
Orthopedic Tech Progress Note Patient Details:  Danielle [name] [birth year] [id number]  Patient ID: [name], female   DOB: 07-29-1931, 85 y.o.   MRN: [id number]   [place] Pink 01/04/2019, 12:14 PMCalled [facility] for left wrist hand orthosis.

## 2019-01-04 NOTE — Evaluation (Signed)
Speech Language Pathology Assessment and Plan  Patient Details  Name: Danielle [name] MRN: [id number] Date of Birth: 1932/05/14  SLP Diagnosis: N/A Rehab Potential: N/A ELOS: N/A    Today's Date: 01/04/2019 SLP Individual Time: 6381-7711 SLP Individual Time Calculation (min): 55 min   Problem List:  Patient Active Problem List   Diagnosis Date Noted  . Right middle cerebral artery stroke ([place]) 01/03/2019  . Carotid stenosis, bilateral 12/30/2018  . Stroke ([place]) 12/24/2018  . Stroke-like symptoms 12/23/2018  . HCAP (healthcare-associated pneumonia) 06/09/2018  . Severe sepsis ([place]) 06/09/2018  . Generalized weakness 06/09/2018  . Right middle lobe pneumonia ([place]) 06/08/2018  . Acute CVA (cerebrovascular accident) ([place]) 05/03/2018  . Hypothyroidism (acquired) 05/03/2018  . Hypertension 05/03/2018  . Uncontrolled type 2 diabetes mellitus with diabetic nephropathy, with long-term current use of insulin ([place])   . UTI due to extended-spectrum beta lactamase (ESBL) producing Escherichia coli   . Pneumothorax 02/05/2017  . Multiple rib fractures 02/05/2017  . Type 2 diabetes mellitus with hypoglycemia without coma ([place]) 02/05/2017  . Clavicle fracture 02/05/2017  . Elevated blood pressure reading 02/05/2017  . UTI (urinary tract infection) 02/05/2017  . Back pain at L4-L5 level 06/14/2013  . Muscle weakness (generalized) 12/17/2012  . Pain in joint, shoulder region 12/17/2012  . Shoulder fracture 12/14/2012  . Proximal humerus fracture 11/29/2012  . FRACTURE, TOE, RIGHT 09/10/2009   Past Medical History:  Past Medical History:  Diagnosis Date  . Arthritis    "some joint pain once in awhile" (05/26/2017)  . CVA (cerebral vascular accident) ([place]) 2019  . History of kidney stones   . Hyperlipemia   . Hypertension   . Hypothyroidism (acquired)   . Pneumothorax 02/05/2017   right-after fall  . Rib fractures 02/05/2017   right side  . Stroke ([place])   . Type II diabetes  mellitus ([place])    Past Surgical History:  Past Surgical History:  Procedure Laterality Date  . AUGMENTATION MAMMAPLASTY Bilateral   . DILATION AND CURETTAGE OF UTERUS    . FRACTURE SURGERY    . REVERSE SHOULDER ARTHROPLASTY Right 05/26/2017  . REVERSE SHOULDER ARTHROPLASTY Right 05/26/2017   Procedure: REVERSE RIGHT SHOULDER ARTHROPLASTY;  Surgeon: Meredith Pel, MD;  Location: [place];  Service: Orthopedics;  Laterality: Right;  . TONSILLECTOMY    . TUBAL LIGATION      Assessment / Plan / Recommendation Clinical Impression Patient is an 85 year old right-handed female history of hypertension, hyperlipidemia, diabetes mellitus, CVA 05/03/2018 maintained on aspirin 325 mg as well as left anterior parafalcine meningioma, and right shoulder arthroplasty. Presented 12/22/2020 [facility] with left upper extremity weakness x2 to 3 days. She denied any chest pain or shortness of breath no nausea vomiting. Cranial CT scan negative for acute changes. Chronic microvascular ischemic changes as well as prior right MCA distribution infarct and prior right basal ganglia lacunar infarct. Patient did not receive TPA. Carotid Doppler showed chronically occluded right ICA as well as moderate left ICA atherosclerosis. Left ICA stenosis estimated at 50 to 69%. MRI/MRA showed multiple small acute right MCA infarcts as well as stable 3.7 cm left anterior parafalcine meningioma unchanged since 2019. Echocardiogram with ejection fraction of 65% normal systolic function. Patient developed increased left-sided weakness and admitted to the office of Dr. Sinda Du 12/29/2018. Follow-up MRI showed multiple new acute right MCA infarcts involving the frontal and parietal lobe. Subacute and chronic infarcts also noted. Patient was [facility] to [facility] for [facility] evaluation. CTA  of head and neck showed bilateral stenosis of both ICA origins likely due to bulky calcification. Vascular surgery  consulted for carotid stenosis Dr. Carlis Abbott who currently recommends no surgery at this time recommendations for outpatient follow-up after rehabilitation completed for CVA. Tolerating a regular consistency diet. Therapy evaluation completed and patient was admitted for a comprehensive rehab program 01/03/19.  Patient administered the Cognistat and scored WFL for all subtests. Although patient has baseline memory deficits, patient's overall recall of functional information appeared intact with ability to utilize external aids with Mod I. Patient also demonstrated appropriate attention to all tasks and was able to divide attention between testing and self-feeding with Mod I and demonstrated appropriate awareness of deficits without safety issues noted. Patient was also overall Mod I for word-finding and expression of wants/needs at the conversation level. Suspect patient is at her baseline level of cognitive-linguistic functioning, therefore, skilled SLP intervention is not warranted at this time. Patient verbalized understanding and agreement.    Skilled Therapeutic Interventions          Administered a cognitive-linguistic evaluation, please see above for details.   SLP Assessment  Patient does not need any further Speech Lanaguage Pathology Services    Recommendations  Oral Care Recommendations: Oral care BID Patient destination: Home Follow up Recommendations: None Equipment Recommended: None recommended by SLP    SLP Frequency N/A  SLP Duration  SLP Intensity  SLP Treatment/Interventions N/A  N/A  N/A   Pain Pain Assessment Pain Scale: 0-10 Pain Score: 0-No pain   Short Term Goals: N/A   Refer to Care Plan for Long Term Goals  Recommendations for other services: None   Discharge Criteria: Patient will be discharged from SLP if patient refuses treatment 3 consecutive times without medical reason, if treatment goals not met, if there is a change in medical status, if patient  makes no progress towards goals or if patient is discharged from hospital.  The above assessment, treatment plan, treatment alternatives and goals were discussed and mutually agreed upon: by patient  Danielle [name] 01/04/2019, 10:45 AM

## 2019-01-05 ENCOUNTER — Inpatient Hospital Stay (HOSPITAL_COMMUNITY): Payer: Medicare Other

## 2019-01-05 ENCOUNTER — Inpatient Hospital Stay (HOSPITAL_COMMUNITY): Payer: Medicare Other | Admitting: Physical Therapy

## 2019-01-05 LAB — GLUCOSE, CAPILLARY
Glucose-Capillary: 323 mg/dL — ABNORMAL HIGH (ref 70–99)
Glucose-Capillary: 260 mg/dL — ABNORMAL HIGH (ref 70–99)
Glucose-Capillary: 304 mg/dL — ABNORMAL HIGH (ref 70–99)
Glucose-Capillary: 268 mg/dL — ABNORMAL HIGH (ref 70–99)

## 2019-01-05 NOTE — Progress Notes (Signed)
Physical Therapy Session Note  Patient Details  Name: Danielle [name] MRN: [id number] Date of Birth: 04-Apr-1932  Today's Date: 01/05/2019 PT Individual Time: 1310-1420 PT Individual Time Calculation (min): 70 min   Short Term Goals: Week 1:  PT Short Term Goal 1 (Week 1): Pt will ambulate 25' w/ min assist PT Short Term Goal 2 (Week 1): Pt will perform dynamic standing balance tasks w/ min assist PT Short Term Goal 3 (Week 1): Pt will negotiate 2 steps w/ R rail w/ min assist  Skilled Therapeutic Interventions/Progress Updates:   Pt in recliner and agreeable to therapy, no c/o pain. Stand pivot to w/c w/ min assist and total assist w/c transport to/from therapy gym. Sit<>stands to high/low table to perform peg board task. Pt required to reach w/ RUE on far L side to reach for pegs, emphasis on maintaining balance w/ L weight shifting and trunk rotation. Needed min-mod visual and tactile cues for successful completion of task, pt w/ more obvious L inattention deficits this session compared to evaluation. Worked on gait training and RLE, ambulated 20' x3 w/ RW and min assist. Verbal and tactile cues for weight shifting and gait pattern, especially w/ LLE step placement and foot clearance. RLE NMR in between gait bouts w/ sit<>stands w/ support only on R armrest, min-mod assist to stabilize in stance. Verbal and tactile cues for anterior weight shifting and R weight shifting. Pt w/ tendency to rest on L hip and lean to L. Mirror for visual feedback of midline. 10-15 sit<>stands in total. Returned to room and assisted w/ changing brief as she reported she was incontinent of urine. Min-mod assist for dynamic standing balance, mod assist to doff and don pants/brief. Ended session in recliner, all needs in reach.   Therapy Documentation Precautions:  Precautions Precautions: None Precaution Comments: L hemiparesis Restrictions Weight Bearing Restrictions: No Vital Signs: Therapy Vitals Temp:  98.3 F (36.8 C) Temp Source: Oral Pulse Rate: 78 Resp: 18 BP: 140/68 Patient Position (if appropriate): Sitting Oxygen Therapy SpO2: 100 % O2 Device: Room Air  Therapy/Group: Individual Therapy  Kayin Kettering [name] 01/05/2019, 2:36 PM

## 2019-01-05 NOTE — Progress Notes (Signed)
Social Work Assessment and Plan   Patient Details  Name: Danielle [name] MRN: [id number] Date of Birth: 1932/01/17  Today's Date: 01/04/2019  Problem List:  Patient Active Problem List   Diagnosis Date Noted  . Right middle cerebral artery stroke ([place]) 01/03/2019  . Carotid stenosis, bilateral 12/30/2018  . Stroke ([place]) 12/24/2018  . Stroke-like symptoms 12/23/2018  . HCAP (healthcare-associated pneumonia) 06/09/2018  . Severe sepsis ([place]) 06/09/2018  . Generalized weakness 06/09/2018  . Right middle lobe pneumonia ([place]) 06/08/2018  . Acute CVA (cerebrovascular accident) ([place]) 05/03/2018  . Hypothyroidism (acquired) 05/03/2018  . Hypertension 05/03/2018  . Uncontrolled type 2 diabetes mellitus with diabetic nephropathy, with long-term current use of insulin ([place])   . UTI due to extended-spectrum beta lactamase (ESBL) producing Escherichia coli   . Pneumothorax 02/05/2017  . Multiple rib fractures 02/05/2017  . Type 2 diabetes mellitus with hypoglycemia without coma ([place]) 02/05/2017  . Clavicle fracture 02/05/2017  . Elevated blood pressure reading 02/05/2017  . UTI (urinary tract infection) 02/05/2017  . Back pain at L4-L5 level 06/14/2013  . Muscle weakness (generalized) 12/17/2012  . Pain in joint, shoulder region 12/17/2012  . Shoulder fracture 12/14/2012  . Proximal humerus fracture 11/29/2012  . FRACTURE, TOE, RIGHT 09/10/2009   Past Medical History:  Past Medical History:  Diagnosis Date  . Arthritis    "some joint pain once in awhile" (05/26/2017)  . CVA (cerebral vascular accident) ([place]) 2019  . History of kidney stones   . Hyperlipemia   . Hypertension   . Hypothyroidism (acquired)   . Pneumothorax 02/05/2017   right-after fall  . Rib fractures 02/05/2017   right side  . Stroke ([place])   . Type II diabetes mellitus ([place])    Past Surgical History:  Past Surgical History:  Procedure Laterality Date  . AUGMENTATION MAMMAPLASTY Bilateral   . DILATION AND  CURETTAGE OF UTERUS    . FRACTURE SURGERY    . REVERSE SHOULDER ARTHROPLASTY Right 05/26/2017  . REVERSE SHOULDER ARTHROPLASTY Right 05/26/2017   Procedure: REVERSE RIGHT SHOULDER ARTHROPLASTY;  Surgeon: Meredith Pel, MD;  Location: [facility];  Service: Orthopedics;  Laterality: Right;  . TONSILLECTOMY    . TUBAL LIGATION     Social History:  reports that she quit smoking about 29 years ago. Her smoking use included cigarettes. She has a 40.00 pack-year smoking history. She has never used smokeless tobacco. She reports that she does not drink alcohol or use drugs.  Family / Support Systems Marital Status: Married How Long?: 48 years Patient Roles: Spouse, Parent, Other (Comment)(grandparent) Spouse/Significant Other: Nimrit Kehres - husband - [phone] (h); [phone] (m) Children: Bjorn Loser - dtr - [phone] (h); [phone] (m); Sonali Wivell - son - [phone] Anticipated Caregiver: spouse and daughter, [name] Ability/Limitations of Caregiver: [name] works as Chiropractor; spouse very [place] and gets meds mixed up Caregiver Availability: 24/7 Family Dynamics: supportive family  Social History Preferred language: English Religion: Baptist Read: Yes Write: Yes Employment Status: Retired Date Retired/Disabled/Unemployed: 2014 Age Retired: 64 Public relations account executive Issues: none reported Guardian/Conservator: N/A - MD has determined that pt is capable of making her own decisions.   Abuse/Neglect Abuse/Neglect Assessment Can Be Completed: Yes Physical Abuse: Denies Verbal Abuse: Denies Sexual Abuse: Denies Exploitation of patient/patient's resources: Denies Self-Neglect: Denies  Emotional Status Pt's affect, behavior and adjustment status: Pt is motivated to work hard and reports being in good spirits. Recent Psychosocial Issues: Pt with a hx of multiple  mini strokes since December 2019. Psychiatric History: none reported Substance Abuse  History: none reported  Patient / Family Perceptions, Expectations & Goals Pt/Family understanding of illness & functional limitations: Pt/dtr have a good understanding of pt's condition and limitations. Premorbid pt/family roles/activities: Pt likes to travel but has not been able to in recent months due to stroke and COVID. Anticipated changes in roles/activities/participation: Pt would like to resume traveling when it is safe and she is able to. Pt/family expectations/goals: Pt wants to return to her home where her husband is.  Community [employer] Agencies: None Premorbid Home Care/DME Agencies: Other (Comment)(Pt has [place] involved already PTA.) Transportation available at discharge: family Resource referrals recommended: Support group (specify)(stroke support group)  Discharge Planning Living Arrangements: Spouse/significant other Support Systems: Spouse/significant other, Children, Friends/neighbors, Home care staff Type of Residence: Private residence Insurance Resources: Multimedia programmer (specify)([employer] Medicare) Financial Resources: Social Security Financial Screen Referred: No Money Management: Patient, Spouse Does the patient have any problems obtaining your medications?: No Home Management: Pt and husband share these responsibilites. Patient/Family Preliminary Plans: Pt plans to return to her home with her family to provide CGA/Supervision level of care. Social Work Anticipated Follow Up Needs: HH/OP, Support Group Expected length of stay: 2 weeks  Clinical Impression CSW met with pt and then spoke with her dtr via telephone to introduce self and role of CSW, as well as to complete assessment.  Pt is in good spirits and is motivated and ready to rehabilitate.  She has a supportive family and looks forward to getting back home to them.  Pt has no concerns/questions/needs at this time.  CSW will continue to follow and assist as  needed.  Salwa Bai, Silvestre Mesi 01/05/2019, 2:00 PM

## 2019-01-05 NOTE — Progress Notes (Signed)
Social Work Patient ID: Danielle [name], female   DOB: [birth year], 85 y.o.   MRN: [id number]   CSW met with pt and spoke with her dtr via telephone to update them on team conference discussion and to alert them that pt will likely need about 2 weeks on CIR with recommendations for CGA/supervision at home.  Pt's husband and other family plan to assist pt at home.  CSW will continue to follow and assist as needed.

## 2019-01-05 NOTE — Progress Notes (Signed)
PHYSICAL MEDICINE & REHABILITATION PROGRESS NOTE   Subjective/Complaints: Had a good night. Was pleased with therapy. Says left WHO helped wrist. Noticed some flickers in left hand yesterday  ROS: Patient denies fever, rash, sore throat, blurred vision, nausea, vomiting, diarrhea, cough, shortness of breath or chest pain, joint or back pain, headache, or mood change.    Objective:   No results found. Recent Labs    01/03/19 1822 01/04/19 0530  WBC 6.3 5.9  HGB 12.5 12.0  HCT 38.8 36.4  PLT 309 301   Recent Labs    01/03/19 0504 01/03/19 1822 01/04/19 0530  NA 138  --  139  K 3.9  --  3.5  CL 106  --  107  CO2 23  --  24  GLUCOSE 256*  --  247*  BUN 19  --  18  CREATININE 0.90 1.06* 0.96  CALCIUM 9.0  --  9.1    Intake/Output Summary (Last 24 hours) at 01/05/2019 0840 Last data filed at 01/04/2019 1831 Gross per 24 hour  Intake 240 ml  Output -  Net 240 ml     Physical Exam: Vital Signs Blood pressure (!) 156/74, pulse 77, temperature 97.9 F (36.6 C), temperature source Oral, resp. rate 19, height 5\' 4"  (1.626 m), weight 59.2 kg, SpO2 100 %.  Constitutional: No distress . Vital signs reviewed. HEENT: EOMI, oral membranes moist Neck: supple Cardiovascular: RRR without murmur. No JVD    Respiratory: CTA Bilaterally without wheezes or rales. Normal effort    GI: BS +, non-tender, non-distended  Musculoskeletal:Normal range of motion.  General: No edema.  Neurological: She isalert. And oriented x 3. Left C7. RUE and RLE 4/5. LUE 0/5---perhaps trace at wrist/hand. LLE remains 3 to 3+/5 prox to distal. Normal pain and LT in all 4's.  Skin: Skin iswarmand dry. She isnot diaphoretic. Noerythema.  Psychiatric: pleasant   Assessment/Plan: 1. Functional deficits secondary to right MCA infarct which require 3+ hours per day of interdisciplinary therapy in a comprehensive inpatient rehab setting.  Physiatrist is providing close team  supervision and 24 hour management of active medical problems listed below.  Physiatrist and rehab team continue to assess barriers to discharge/monitor patient progress toward functional and medical goals  Care Tool:  Bathing    Body parts bathed by patient: Left arm, Abdomen, Chest, Front perineal area, Face   Body parts bathed by helper: Right arm, Buttocks, Right upper leg, Left lower leg, Right lower leg, Left upper leg     Bathing assist Assist Level: Maximal Assistance - Patient 24 - 49%     Upper Body Dressing/Undressing Upper body dressing   What is the patient wearing?: Pull over shirt    Upper body assist Assist Level: Moderate Assistance - Patient 50 - 74%    Lower Body Dressing/Undressing Lower body dressing      What is the patient wearing?: Pants, Incontinence brief     Lower body assist Assist for lower body dressing: Maximal Assistance - Patient 25 - 49%     Toileting Toileting    Toileting assist Assist for toileting: Moderate Assistance - Patient 50 - 74%     Transfers Chair/bed transfer  Transfers assist     Chair/bed transfer assist level: Minimal Assistance - Patient > 75%     Locomotion Ambulation   Ambulation assist      Assist level: Moderate Assistance - Patient 50 - 74% Assistive device: Walker-rolling Max distance: 15'   Walk 10 feet  activity   Assist     Assist level: Moderate Assistance - Patient - 50 - 74%     Walk 50 feet activity   Assist Walk 50 feet with 2 turns activity did not occur: Safety/medical concerns         Walk 150 feet activity   Assist Walk 150 feet activity did not occur: Safety/medical concerns         Walk 10 feet on uneven surface  activity   Assist Walk 10 feet on uneven surfaces activity did not occur: Safety/medical concerns         Wheelchair     Assist Will patient use wheelchair at discharge?: No   Wheelchair activity did not occur: N/A          Wheelchair 50 feet with 2 turns activity    Assist    Wheelchair 50 feet with 2 turns activity did not occur: N/A       Wheelchair 150 feet activity     Assist  Wheelchair 150 feet activity did not occur: N/A       Blood pressure (!) 156/74, pulse 77, temperature 97.9 F (36.6 C), temperature source Oral, resp. rate 19, height 5\' 4"  (1.626 m), weight 59.2 kg, SpO2 100 %.  Medical Problem List and Plan: 1.Left-sided weaknesssecondary to recurrent right MCA infarct in setting of right ICA occlusion and severe left ICA stenosis. --Patient is beginning CIR therapies today including PT, OT, and SLP   -ELOS 14 days  -left WHO at HS 2. Antithrombotics: -DVT/anticoagulation:Lovenox -antiplatelet therapy: Aspirin 325 mg daily and Plavix 75 mg daily 3. Pain Management:Tylenol as needed 4. Mood:Provide emotional support -antipsychotic agents: N/A 5. Neuropsych: This patientiscapable of making decisions on herown behalf. 6. Skin/Wound Care:Routine skin checks 7. Fluids/Electrolytes/Nutrition: encourage PO  -intake reasonable 8. Severe left internalcarotid stenosis. Follow-up outpatient vascular surgery Dr. Carlis Abbott to consider intervention 9. Hypertension. Norvasc 5 mg daily. Controlled at present 10. Diabetes mellitus. Hemoglobin A1c 10.3. SSI. Check blood sugars before meals and at bedtime. Lantus insulin 15 units daily  -remains poorly controlled  -adjusted lantus to 20u beginning this morning  -may need bid dosing  -pt is on heart healthy cm diet  CBG (last 3)  Recent Labs    01/04/19 1652 01/04/19 2005 01/05/19 0754  GLUCAP 253* 270* 323*   11. Hyperlipidemia. Lipitor 12. Hypothyroidism. Continue Synthroid     LOS: 2 days A FACE TO FACE EVALUATION WAS PERFORMED  Danielle [name] 01/05/2019, 8:40 AM

## 2019-01-05 NOTE — Progress Notes (Signed)
Inpatient [facility] Individual Statement of Services  Patient Name:  Danielle [name]  Date:  01/05/2019  Welcome to the [facility].  Our goal is to provide you with an individualized program based on your diagnosis and situation, designed to meet your specific needs.  With this comprehensive rehabilitation program, you will be expected to participate in at least 3 hours of rehabilitation therapies Monday-Friday, with modified therapy programming on the weekends.  Your rehabilitation program will include the following services:  Physical Therapy (PT), Occupational Therapy (OT), Speech Therapy (ST), 24 hour per day rehabilitation nursing, Case Management (Social Worker), Rehabilitation Medicine, Nutrition Services and Pharmacy Services  Weekly team conferences will be held on Tuesdays to discuss your progress.  Your Social Worker will talk with you frequently to get your input and to update you on team discussions.  Team conferences with you and your family in attendance may also be held.  Expected length of stay: 2 weeks  Overall anticipated outcome: Contact guard assistance/Supervision  Depending on your progress and recovery, your program may change. Your Social Worker will coordinate services and will keep you informed of any changes. Your Social Worker's name and contact numbers are listed  below.  The following services may also be recommended but are not provided by the [facility] will be made to provide these services after discharge if needed.  Arrangements include referral to agencies that provide these services.  Your insurance has been verified to be:  [employer] Your primary doctor is:  Dr, Sinda Du  Pertinent information will be shared with your doctor and your insurance company.  Social  Worker:  Alfonse Alpers, LCSW  [phone] or [phone]  Information discussed with and copy given to patient by: Trey Sailors, 01/05/2019, 7:39 AM

## 2019-01-05 NOTE — Progress Notes (Signed)
Occupational Therapy Session Note  Patient Details  Name: Danielle [name] MRN: [id number] Date of Birth: 01-08-1932  Today's Date: 01/05/2019 OT Individual Time: 0600-4599 OT Individual Time Calculation (min): 57 min   Today's Date: 01/05/2019 OT Individual Time: 1115-1200 OT Individual Time Calculation (min): 45  min    Short Term Goals: Week 1:  OT Short Term Goal 1 (Week 1): Pt will recall hemi-dressing techniques and thread R UE into shirt with min A OT Short Term Goal 2 (Week 1): Pt will complete 1 step of LB dressing task OT Short Term Goal 3 (Week 1): Pt will demonstrate self ROM to L UE with min questioning cues  Skilled Therapeutic Interventions/Progress Updates:    1:1. Pt received inbathroom with RN. Pt completes toileting with MAX A for components and MIN A for standing balance. Pt completes bathing at sit to stand level at isnk with MOD A overall with HOH A for NMR of LUE to wash RUE. Pt dresses UB with MOD A and max VC for hemi dressing techniques. Pt completes LB dressing with MOD A overall for threading  LLE into pants, however pt able to thread LLE into underwear. Pt sit to stand with MIN A while OT advances pants past hip. Pt completes oral care at sink with VC for one handed technique and min A overall. Pt completes standing trials at sink iwht VC for weight shifting to R. Exited session with pt seated in recliner, call light in reach and all needs met  Session 2: Pt received in w/c with no report of pain. Pt completes towel slides and UE ranger for shoulder flex/ext, horizontal ab/adduction and elbow flex/ext, trace horizontal adduction and elbow flexion noted on ranger otherwise total A for movements with VC for decreasing compensatory strategies. Pt educated on self ROM with handout provided and OT reviews 1x10 reps of program with pt to comfort so pt may complete in free time in room. Exited session with pt seated in w/c, call light in reach and all needs met.    Therapy Documentation Precautions:  Precautions Precautions: None Precaution Comments: L hemiparesis Restrictions Weight Bearing Restrictions: No General:   Vital Signs:  Pain:   ADL: ADL Eating: Minimal assistance Grooming: Minimal assistance Upper Body Bathing: Moderate assistance Lower Body Bathing: Maximal assistance Upper Body Dressing: Moderate assistance Lower Body Dressing: Maximal assistance Toileting: Moderate assistance Toilet Transfer: Minimal assistance Toilet Transfer Method: Stand pivot Tub/Shower Transfer: Moderate assistance Tub/Shower Equipment: Nurse, learning disability    Praxis   Exercises:   Other Treatments:     Therapy/Group: Individual Therapy  Tonny Branch 01/05/2019, 8:45 AM

## 2019-01-05 NOTE — Patient Care Conference (Signed)
Inpatient RehabilitationTeam Conference and Plan of Care Update Date: 01/04/2019   Time: 10:45 AM    Patient Name: Danielle [name]      Medical Record Number: [id number]  Date of Birth: [birth year] Sex: Female         Room/Bed: 4W12C/4W12C-01 Payor Info: Payor: [employer] MEDICARE / Plan: [facility] MEDICARE / Product Type: *No Product type* /    Admitting Diagnosis: 2. ABI Team  RT. MCA CVA  Admit Date/Time:  01/03/2019  5:35 PM Admission Comments: No comment available   Primary Diagnosis:  <principal problem not specified> Principal Problem: <principal problem not specified>  Patient Active Problem List   Diagnosis Date Noted  . Right middle cerebral artery stroke ([place]) 01/03/2019  . Carotid stenosis, bilateral 12/30/2018  . Stroke ([place]) 12/24/2018  . Stroke-like symptoms 12/23/2018  . HCAP (healthcare-associated pneumonia) 06/09/2018  . Severe sepsis ([place]) 06/09/2018  . Generalized weakness 06/09/2018  . Right middle lobe pneumonia ([place]) 06/08/2018  . Acute CVA (cerebrovascular accident) ([place]) 05/03/2018  . Hypothyroidism (acquired) 05/03/2018  . Hypertension 05/03/2018  . Uncontrolled type 2 diabetes mellitus with diabetic nephropathy, with long-term current use of insulin ([place])   . UTI due to extended-spectrum beta lactamase (ESBL) producing Escherichia coli   . Pneumothorax 02/05/2017  . Multiple rib fractures 02/05/2017  . Type 2 diabetes mellitus with hypoglycemia without coma ([place]) 02/05/2017  . Clavicle fracture 02/05/2017  . Elevated blood pressure reading 02/05/2017  . UTI (urinary tract infection) 02/05/2017  . Back pain at L4-L5 level 06/14/2013  . Muscle weakness (generalized) 12/17/2012  . Pain in joint, shoulder region 12/17/2012  . Shoulder fracture 12/14/2012  . Proximal humerus fracture 11/29/2012  . FRACTURE, TOE, RIGHT 09/10/2009    Expected Discharge Date: Expected Discharge Date: (2 weeks)  Team Members Present: Physician leading conference:  Dr. Alger Simons Social Worker Present: Alfonse Alpers, LCSW Nurse Present: Benjie Karvonen, RN PT Present: Burnard Bunting, PT OT Present: Cherylynn Ridges, OT SLP Present: Weston Anna, SLP PPS Coordinator present : Gunnar Fusi, SLP     Current Status/Progress Goal Weekly Team Focus  Medical   right mca infarct. left hemiparesis. UE>LE. htn, diabetes, left carotid stenosis  improve functional mobility  strok ed, bp and dm control   Bowel/Bladder   Continent of bowel and bladder, LBM 01/03/2019  Remain contient of bowel and bladder with minimal assistance  Assess bowel and bladder needs q shift and PRN and offer toileting q2h while awake   Swallow/Nutrition/ Hydration             ADL's   Mod/max BADL tasks, Min A transfers  Supervision/ Min A  self-care retraining, L NMR, functional transfers, general strengthening   Mobility   eval pending         Communication             Safety/Cognition/ Behavioral Observations  Eval done, No SLP needs         Pain   No complaints of pain  Pain < 3/10  Assess pain q shift and prn and medicate as indicated   Skin   MASD to groin  Remains free from infection and skin breakdown  Assess skin q shift and PRN    Rehab Goals Patient on target to meet rehab goals: Yes Rehab Goals Revised: none - pt's first team conference on day of evaluations *See Care Plan and progress notes for long and short-term goals.     Barriers to Discharge  Current Status/Progress Possible Resolutions  Date Resolved   Physician    Medical stability               Nursing                  PT  Medical stability  pt w/ many recent strokes              OT                  SLP                SW                Discharge Planning/Teaching Needs:  Pt plans to return home with her husband and other family to assist as needed.  Family education to be offered closer to d/c.   Team Discussion:  Pt with R MCA infarct, resulting in L hemiparesis, more in arm than leg.   She also has a hx of old CVA.  Pt with some increased blood pressure and carotid stenosis, which will need outpt f/u.  Pt is very motivated to work on [employer]  Pt with some MASD skin areas, but no other nursing needs.  Pt with some short term forgetfulness.  Pt is min A for txs and needs more A with ADLs, mod/max lower body.  OT asked MD if estim could be used on arm and he approved.  ST identified baseline memory impairments, but doing well overall and ST is not continuing to see pt.  Revisions to Treatment Plan:  none    Continued Need for Acute Rehabilitation Level of Care: The patient requires daily medical management by a physician with specialized training in physical medicine and rehabilitation for the following conditions: Daily direction of a multidisciplinary physical rehabilitation program to ensure safe treatment while eliciting the highest outcome that is of practical value to the patient.: Yes Daily medical management of patient stability for increased activity during participation in an intensive rehabilitation regime.: Yes   I attest that I was present, lead the team conference, and concur with the assessment and plan of the team.Team conference was held via web/ teleconference due to [place] - 19.   [name], Silvestre Mesi 01/05/2019, 7:31 AM

## 2019-01-06 ENCOUNTER — Inpatient Hospital Stay (HOSPITAL_COMMUNITY): Payer: Medicare Other | Admitting: Physical Therapy

## 2019-01-06 ENCOUNTER — Inpatient Hospital Stay (HOSPITAL_COMMUNITY): Payer: Medicare Other | Admitting: Occupational Therapy

## 2019-01-06 LAB — GLUCOSE, CAPILLARY
Glucose-Capillary: 310 mg/dL — ABNORMAL HIGH (ref 70–99)
Glucose-Capillary: 321 mg/dL — ABNORMAL HIGH (ref 70–99)
Glucose-Capillary: 229 mg/dL — ABNORMAL HIGH (ref 70–99)
Glucose-Capillary: 171 mg/dL — ABNORMAL HIGH (ref 70–99)

## 2019-01-06 MED ORDER — INSULIN GLARGINE 100 UNIT/ML ~~LOC~~ SOLN
10.0000 [IU] | Freq: Every day | SUBCUTANEOUS | Status: DC
  Administered 2019-01-06 – 2019-01-09 (×4): 10 [IU] via SUBCUTANEOUS
  Filled 2019-01-06 (×5): qty 0.1

## 2019-01-06 NOTE — Progress Notes (Signed)
Occupational Therapy Session Note  Patient Details  Name: Danielle [name] MRN: [id number] Date of Birth: May 05, 1932  Today's Date: 01/06/2019 OT Individual Time: 1045-1200 OT Individual Time Calculation (min): 75 min    Short Term Goals: Week 1:  OT Short Term Goal 1 (Week 1): Pt will recall hemi-dressing techniques and thread R UE into shirt with min A OT Short Term Goal 2 (Week 1): Pt will complete 1 step of LB dressing task OT Short Term Goal 3 (Week 1): Pt will demonstrate self ROM to L UE with min questioning cues  Skilled Therapeutic Interventions/Progress Updates:     Pt greeted seated in recliner with spouse present and agreeable to OT treatment session. Pt declined bathing/dressing today. Stand-pivot to wc with min A. Pt brought to therapy gym and pivoted to therapy mat with min A. Pt brought into gravity eliminated side-lying position for L UE NMR. OT provided joint input to bring pt through full elbow/wrist/hand/shoulder ROM. OT placed L UE in hand skate and worked on elbow flex/ext and shoulder flex/ext. Pt with trace scapula protraction/retraction and trace bicep contraction noted to elicit slight elbow flexion. Applied 1:1 NMES to CH1 supraspinatus and middle deltoid to help approximate shoulder joint, and Ch 2 wrist extensors.  Ratio 1:1 Rate 35 pps Waveform- Asymmetric Ramp 1.0 Pulse 300 CH1 Intensity- 20  Duration -  15  CH2 Intensity- 24 Duration -  15  Pt returned to room at end of session and completed stand-pivot back to recliner with min A. Pt left with chair alarm on and needs met.    Therapy Documentation Precautions:  Precautions Precautions: None Precaution Comments: L hemiparesis Restrictions Weight Bearing Restrictions: No Pain:  denies pain   Therapy/Group: Individual Therapy  Valma Cava 01/06/2019, 11:38 AM

## 2019-01-06 NOTE — Progress Notes (Signed)
Physical Therapy Session Note  Patient Details  Name: Danielle [name] MRN: [id number] Date of Birth: 1932/03/28  Today's Date: 01/06/2019 PT Individual Time: 0800-0908 AND 1300-1345 PT Individual Time Calculation (min): 68 min AND 45 min  Short Term Goals: Week 1:  PT Short Term Goal 1 (Week 1): Pt will ambulate 25' w/ min assist PT Short Term Goal 2 (Week 1): Pt will perform dynamic standing balance tasks w/ min assist PT Short Term Goal 3 (Week 1): Pt will negotiate 2 steps w/ R rail w/ min assist  Skilled Therapeutic Interventions/Progress Updates:   Session 1: Pt in elevated supine, eating breakfast but notes she is very uncomfortable in the position she's in. Supine>sit w/ mod assist and maintained static sitting balance w/ close supervision while finishing breakfast. Min assist stand pivot to w/c. Total assist w/c transport to/from therapy gym.   Worked on standing balance w/o UE support while standing to match playing cards and then pin clothspins. Min-mod assist for static balance at first w/ tactile and verbal cues for anterior weight shifting. Pt stated "I feel like I'm going to fall over". Max encouragement to trust therapist to prevent a fall. Multiple bouts of standing for 20-30 sec at a time, assist level faded to CGA towards end of each bout. Ongoing verbal and tactile cues for anterior weight shifting and lateral weight shifting, especially w/ trunk rotation - pt w/ posterior lean and L lateral lean bias. Occasional tactile cues to straighten L knee w/ prolonged static stance. Difficulty maintaining balance w/ any L weight shifting.   LUE flexion and horizontal abduction/adduction exercises on table while in stance w/ emphasis on standing balance w/ L weight shifting while facilitating LUE weight bearing. Started w/ RUE support on table w/ CGA, min assist for balance w/o RUE support on table.   Returned to room, min assist stand pivot to recliner. Ended session in recliner,  all needs in reach.   Session 2:  Pt in recliner and agreeable to therapy, no c/o pain. Worked on gait training while ambulating to therapy gym in 20-25' bouts x4 in total, needed seated rest breaks 2/2 fatigue. Min assist overall w/ verbal cues to increase L foot clearance and step length and for RW management. No L knee buckling noted this session and improved knee control. NuStep 5 min @ level 1 w/ BLEs and light RUE assist. 5 min again w/ just BLEs to work on LLE activation. Needed min tactile and verbal cues at first for reciprocal movement pattern and fluidity of motion. Ambulated 25' w/o AD back towards room. Manual assist for balance and lateral weight shifting. Returned to room, total assist via w/c. Ended session in recliner, all needs in reach.   Therapy Documentation Precautions:  Precautions Precautions: None Precaution Comments: L hemiparesis Restrictions Weight Bearing Restrictions: No Vital Signs: Therapy Vitals Temp: 98.4 F (36.9 C) Temp Source: Oral Pulse Rate: 79 BP: (!) 147/62 Patient Position (if appropriate): Lying Oxygen Therapy SpO2: 96 % O2 Device: Room Air  Therapy/Group: Individual Therapy  [name] K Ulysess Witz 01/06/2019, 9:08 AM

## 2019-01-06 NOTE — Progress Notes (Signed)
PHYSICAL MEDICINE & REHABILITATION PROGRESS NOTE   Subjective/Complaints: No new issues. Up with therapy early this morning  ROS: Patient denies fever, rash, sore throat, blurred vision, nausea, vomiting, diarrhea, cough, shortness of breath or chest pain, joint or back pain, headache, or mood change.   Objective:   No results found. Recent Labs    01/03/19 1822 01/04/19 0530  WBC 6.3 5.9  HGB 12.5 12.0  HCT 38.8 36.4  PLT 309 301   Recent Labs    01/03/19 1822 01/04/19 0530  NA  --  139  K  --  3.5  CL  --  107  CO2  --  24  GLUCOSE  --  247*  BUN  --  18  CREATININE 1.06* 0.96  CALCIUM  --  9.1    Intake/Output Summary (Last 24 hours) at 01/06/2019 1051 Last data filed at 01/05/2019 1902 Gross per 24 hour  Intake 320 ml  Output -  Net 320 ml     Physical Exam: Vital Signs Blood pressure (!) 147/62, pulse 79, temperature 98.4 F (36.9 C), temperature source Oral, resp. rate 18, height 5\' 4"  (1.626 m), weight 59.2 kg, SpO2 96 %.  Constitutional: No distress . Vital signs reviewed. HEENT: EOMI, oral membranes moist Neck: supple Cardiovascular: RRR without murmur. No JVD    Respiratory: CTA Bilaterally without wheezes or rales. Normal effort    GI: BS +, non-tender, non-distended  Musculoskeletal:Normal range of motion.  General: No edema.  Neurological: She isalert. And oriented x 3. Left C7. RUE and RLE 4/5. LUE 0/5---perhaps trace at wrist/hand. LLE remains 3 to 3+/5 prox to distal. Normal pain and LT in all 4's.  Skin: Skin iswarmand dry. She isnot diaphoretic. Noerythema.  Psychiatric: pleasant   Assessment/Plan: 1. Functional deficits secondary to right MCA infarct which require 3+ hours per day of interdisciplinary therapy in a comprehensive inpatient rehab setting.  Physiatrist is providing close team supervision and 24 hour management of active medical problems listed below.  Physiatrist and rehab team continue to assess  barriers to discharge/monitor patient progress toward functional and medical goals  Care Tool:  Bathing    Body parts bathed by patient: Left arm, Abdomen, Chest, Front perineal area, Face   Body parts bathed by helper: Right arm, Buttocks, Right upper leg, Left lower leg, Right lower leg, Left upper leg     Bathing assist Assist Level: Maximal Assistance - Patient 24 - 49%     Upper Body Dressing/Undressing Upper body dressing   What is the patient wearing?: Pull over shirt    Upper body assist Assist Level: Moderate Assistance - Patient 50 - 74%    Lower Body Dressing/Undressing Lower body dressing      What is the patient wearing?: Underwear/pull up, Pants     Lower body assist Assist for lower body dressing: Maximal Assistance - Patient 25 - 49%     Toileting Toileting    Toileting assist Assist for toileting: Moderate Assistance - Patient 50 - 74%     Transfers Chair/bed transfer  Transfers assist     Chair/bed transfer assist level: Minimal Assistance - Patient > 75%     Locomotion Ambulation   Ambulation assist      Assist level: Minimal Assistance - Patient > 75% Assistive device: Walker-rolling Max distance: 20'   Walk 10 feet activity   Assist     Assist level: Minimal Assistance - Patient > 75% Assistive device: Walker-rolling   Walk 50  feet activity   Assist Walk 50 feet with 2 turns activity did not occur: Safety/medical concerns         Walk 150 feet activity   Assist Walk 150 feet activity did not occur: Safety/medical concerns         Walk 10 feet on uneven surface  activity   Assist Walk 10 feet on uneven surfaces activity did not occur: Safety/medical concerns         Wheelchair     Assist Will patient use wheelchair at discharge?: No   Wheelchair activity did not occur: N/A         Wheelchair 50 feet with 2 turns activity    Assist    Wheelchair 50 feet with 2 turns activity did not  occur: N/A       Wheelchair 150 feet activity     Assist  Wheelchair 150 feet activity did not occur: N/A       Blood pressure (!) 147/62, pulse 79, temperature 98.4 F (36.9 C), temperature source Oral, resp. rate 18, height 5\' 4"  (1.626 m), weight 59.2 kg, SpO2 96 %.  Medical Problem List and Plan: 1.Left-sided weaknesssecondary to recurrent right MCA infarct in setting of right ICA occlusion and severe left ICA stenosis. --Patient is beginning CIR therapies today including PT, OT, and SLP   -ELOS 14 days  -left WHO at HS 2. Antithrombotics: -DVT/anticoagulation:Lovenox -antiplatelet therapy: Aspirin 325 mg daily and Plavix 75 mg daily 3. Pain Management:Tylenol as needed 4. Mood:Provide emotional support -antipsychotic agents: N/A 5. Neuropsych: This patientiscapable of making decisions on herown behalf. 6. Skin/Wound Care:Routine skin checks 7. Fluids/Electrolytes/Nutrition: encourage PO  -intake reasonable 8. Severe left internalcarotid stenosis. Follow-up outpatient vascular surgery Dr. Carlis Abbott to consider intervention 9. Hypertension. Norvasc 5 mg daily. Controlled at present 10. Diabetes mellitus. Hemoglobin A1c 10.3. SSI. Check blood sugars before meals and at bedtime. Lantus insulin 15 units daily  -remains poorly controlled  -adjusted lantus to 20u beginning 8/19 without benefit  -add PM lantus 10u  -pt is on heart healthy cm diet  CBG (last 3)  Recent Labs    01/05/19 1627 01/05/19 2116 01/06/19 0634  GLUCAP 304* 268* 310*   11. Hyperlipidemia. Lipitor 12. Hypothyroidism. Continue Synthroid     LOS: 3 days A FACE TO FACE EVALUATION WAS PERFORMED  Meredith Staggers 01/06/2019, 10:51 AM

## 2019-01-06 NOTE — IPOC Note (Signed)
Overall Plan of Care [facility]) Patient Details Name: Danielle [name] MRN: [id number] DOB: April 06, 1932  Admitting Diagnosis: Right middle cerebral artery stroke [facility])  Hospital Problems: Principal Problem:   Right middle cerebral artery stroke [facility])     Functional Problem List: Nursing Bladder, Bowel, Endurance, Medication Management, Nutrition, Pain, Safety, Sensory, Motor  PT Balance, Behavior, Endurance, Motor, Safety  OT Balance, Endurance, Motor, Perception, Safety, Sensory  SLP    TR         Basic ADL's: OT Eating, Dressing, Grooming, Bathing, Toileting     Advanced  ADL's: OT       Transfers: PT Bed Mobility, Bed to Chair, Furniture, Musician, Floor  OT Tub/Shower, Agricultural engineer: PT Stairs, Ambulation     Additional Impairments: OT Fuctional Use of Upper Extremity  SLP None      TR      Anticipated Outcomes Item Anticipated Outcome  Self Feeding Set-up A  Swallowing      Basic self-care  Min A/supervision  Insurance underwriter Transfers Supervision  Bowel/Bladder  Continent of Bowel and Bladder with moderate assistance  Transfers  supervision  Locomotion  CGA gait in household environment w/ LRAD  Communication     Cognition     Pain  Pain Level less than 4 on scale of 0-10  Safety/Judgment  Remians free of injury, preven tfalls with moderate assistance   Therapy Plan: PT Intensity: Minimum of 1-2 x/day ,45 to 90 minutes PT Frequency: 5 out of 7 days PT Duration Estimated Length of Stay: 12-14 days OT Intensity: Minimum of 1-2 x/day, 45 to 90 minutes OT Frequency: 5 out of 7 days OT Duration/Estimated Length of Stay: 12-14 days SLP Duration/Estimated Length of Stay: N/A   Due to the current state of emergency, patients may not be receiving their 3-hours of Medicare-mandated therapy.   Team Interventions: Nursing Interventions Patient/Family Education, Bladder Management, Bowel Management, Disease  Management/Prevention, Pain Management, Medication Management, Cognitive Remediation/Compensation  PT interventions Ambulation/gait training, Functional mobility training, Discharge planning, Psychosocial support, Therapeutic Activities, Therapeutic Exercise, Skin care/wound management, Neuromuscular re-education, Disease management/prevention, Medical illustrator training, DME/adaptive equipment instruction, Splinting/orthotics, UE/LE Strength taining/ROM, Pain management, Community reintegration, Functional electrical stimulation, Patient/family education, IT trainer, UE/LE Coordination activities  OT Interventions Training and development officer, Community reintegration, Discharge planning, DME/adaptive equipment instruction, Functional electrical stimulation, Functional mobility training, Neuromuscular re-education, Patient/family education, Psychosocial support, Self Care/advanced ADL retraining, Splinting/orthotics, Therapeutic Activities, Therapeutic Exercise, UE/LE Strength taining/ROM, UE/LE Coordination activities, Wheelchair propulsion/positioning  SLP Interventions    TR Interventions    SW/CM Interventions Discharge Planning, Psychosocial Support, Patient/Family Education   Barriers to Discharge MD  Medical stability  Nursing      PT Medical stability pt w/ many recent strokes  OT      SLP      SW       Team Discharge Planning: Destination: PT-Home ,OT- Home , SLP-Home Projected Follow-up: PT-Home health PT, OT-  Home health OT, 24 hour supervision/assistance, SLP-None Projected Equipment Needs: PT-To be determined, OT- To be determined, SLP-None recommended by SLP Equipment Details: PT-has SPC, standard walker, and rollator, OT-  Patient/family involved in discharge planning: PT- Family member/caregiver, Patient,  OT-Patient, SLP-Patient  MD ELOS: 12-14 days Medical Rehab Prognosis:  Excellent Assessment: The patient has been admitted for CIR therapies with the diagnosis of  right MCA infarct. The team will be addressing functional mobility, strength, stamina, balance, safety, adaptive techniques and equipment, self-care, bowel and bladder mgt, patient  and caregiver education, NMR, cognition, community reintegration. Goals have been set at supervision to min assist with self-care and supervision with mobility. Pt very motivated.   Due to the current state of emergency, patients may not be receiving their 3 hours per day of Medicare-mandated therapy.    Meredith Staggers, MD, FAAPMR      See Team Conference Notes for weekly updates to the plan of care

## 2019-01-07 ENCOUNTER — Inpatient Hospital Stay (HOSPITAL_COMMUNITY): Payer: Medicare Other | Admitting: Occupational Therapy

## 2019-01-07 ENCOUNTER — Inpatient Hospital Stay (HOSPITAL_COMMUNITY): Payer: Medicare Other | Admitting: Physical Therapy

## 2019-01-07 LAB — GLUCOSE, CAPILLARY
Glucose-Capillary: 183 mg/dL — ABNORMAL HIGH (ref 70–99)
Glucose-Capillary: 312 mg/dL — ABNORMAL HIGH (ref 70–99)
Glucose-Capillary: 196 mg/dL — ABNORMAL HIGH (ref 70–99)
Glucose-Capillary: 166 mg/dL — ABNORMAL HIGH (ref 70–99)

## 2019-01-07 NOTE — Progress Notes (Signed)
Physical Therapy Session Note  Patient Details  Name: Danielle [name] MRN: [id number] Date of Birth: March 03, 1932  Today's Date: 01/07/2019 PT Individual Time: 1050-1200 PT Individual Time Calculation (min): 70 min   Short Term Goals: Week 1:  PT Short Term Goal 1 (Week 1): Pt will ambulate 25' w/ min assist PT Short Term Goal 2 (Week 1): Pt will perform dynamic standing balance tasks w/ min assist PT Short Term Goal 3 (Week 1): Pt will negotiate 2 steps w/ R rail w/ min assist  Skilled Therapeutic Interventions/Progress Updates:   Pt in w/c and agreeable to therapy, no c/o pain. Total assist w/c transport to therapy gym for time management. Worked on standing balance tasks w/o UE support, emphasis on maintaining balance and activating appropriate balance strategies w/ L weight shifting and trunk rotation. Lateral weight shifts in stance w/o UE support to work on pt confidence w/ LLE weight shifting. Tasks included pinning clothespins to basketball hoop and dynavision. See dynavision results below. Min assist overall for dynamic standing balance tasks. Ambulated 25' x3 back to room w/ RW and LUE orthosis. Verbal and visual cues for step placement and gait pattern, manual assist for lateral weight shifting and upright balance, and verbal cues for RW management. Ended session in recliner, all needs in reach.   Dynavision seated: L - 2.25 sec R - 1.85 sec  Dynavision standing: L - 3.5 sec R - 1.62  Therapy Documentation Precautions:  Precautions Precautions: Fall Precaution Comments: L hemiparesis Restrictions Weight Bearing Restrictions: No  Therapy/Group: Individual Therapy  [name] [name] 01/07/2019, 12:53 PM

## 2019-01-07 NOTE — Progress Notes (Signed)
Occupational Therapy Session Note  Patient Details  Name: Danielle [name] MRN: [id number] Date of Birth: Jun 07, 1931  Today's Date: 01/07/2019 OT Individual Time: [id number] OT Individual Time Calculation (min): 64 min    Short Term Goals: Week 1:  OT Short Term Goal 1 (Week 1): Pt will recall hemi-dressing techniques and thread R UE into shirt with min A OT Short Term Goal 2 (Week 1): Pt will complete 1 step of LB dressing task OT Short Term Goal 3 (Week 1): Pt will demonstrate self ROM to L UE with min questioning cues  Skilled Therapeutic Interventions/Progress Updates:    Pt completed supine to sit EOB with mod assist with mod assist for stand pivot transfer to the wheelchair.  She was taken down to the therapy gym via wheelchair and then was transferred from the wheelchair to the therapy mat with mod assist.  She was able to transition to supine with mod facilitation.  She was able to work on scapular weightbearing through the [place] with rolling supine to the left with min facilitation and min instructional cueing.  She completed 2 sets of 10 reps for this with transition to sitting with mod assist.  Next, had pt focus on LUE weightbearing with LUE placed on therapy mat as well as therapist's knee as she worked on sit to squat transitions for reaching to targets and for placing clothespins on a horizontal bars.  Transitioned to working on activation of the LUE for internal and external rotation while having the LUE placed on a therapy ball.  She is able to exhibit trace internal rotation with some supination noted when attempting activation of the shoulder.  Finished session with return to the room and pt sitting up in the wheelchair with call button and phone in reach with safety belt in place.    Therapy Documentation Precautions:  Precautions Precautions: Fall Precaution Comments: L hemiparesis Restrictions Weight Bearing Restrictions: No  Pain: Pain Assessment Pain Scale:  Faces Pain Score: 0-No pain ADL: See Care Tool Section for some details of mobility  Therapy/Group: Individual Therapy  Cleota Pellerito OTR/L 01/07/2019, 12:33 PM

## 2019-01-07 NOTE — Plan of Care (Signed)
  Problem: RH BOWEL ELIMINATION Goal: RH STG MANAGE BOWEL WITH ASSISTANCE Description: STG Manage Bowel with Mod I Assistance. Outcome: Progressing Goal: RH STG MANAGE BOWEL W/MEDICATION W/ASSISTANCE Description: STG Manage Bowel with Medication with Mod I Assistance. Outcome: Progressing   Problem: RH BLADDER ELIMINATION Goal: RH STG MANAGE BLADDER WITH ASSISTANCE Description: STG Manage Bladder With Mod I Assistance Outcome: Progressing   Problem: RH SKIN INTEGRITY Goal: RH STG SKIN FREE OF INFECTION/BREAKDOWN Outcome: Progressing   Problem: RH SAFETY Goal: RH STG ADHERE TO SAFETY PRECAUTIONS W/ASSISTANCE/DEVICE Description: STG Adhere to Safety Precautions With Mod I Assistance/Device. Outcome: Progressing   Problem: Consults Goal: RH STROKE PATIENT EDUCATION Description: Patient/family will be able to verbalize prevention of stroke, medications, and self care after discharge with cues/handouts Outcome: Progressing

## 2019-01-07 NOTE — Progress Notes (Signed)
[place] PHYSICAL MEDICINE & REHABILITATION PROGRESS NOTE   Subjective/Complaints: Up in bathroom. No new complaints. Left leg feels weak.  ROS: Patient denies fever, rash, sore throat, blurred vision, nausea, vomiting, diarrhea, cough, shortness of breath or chest pain, joint or back pain, headache, or mood change.   Objective:   No results found. No results for input(s): WBC, HGB, HCT, PLT in the last 72 hours. No results for input(s): NA, K, CL, CO2, GLUCOSE, BUN, CREATININE, CALCIUM in the last 72 hours.  Intake/Output Summary (Last 24 hours) at 01/07/2019 1026 Last data filed at 01/07/2019 0806 Gross per 24 hour  Intake 240 ml  Output -  Net 240 ml     Physical Exam: Vital Signs Blood pressure (!) 120/58, pulse 74, temperature 98.5 F (36.9 C), temperature source Oral, resp. rate 18, height 5\' 4"  (1.626 m), weight 59.2 kg, SpO2 98 %.  Constitutional: No distress . Vital signs reviewed. HEENT: EOMI, oral membranes moist Neck: supple Cardiovascular: RRR without murmur. No JVD    Respiratory: CTA Bilaterally without wheezes or rales. Normal effort    GI: BS +, non-tender, non-distended  Musculoskeletal:Normal range of motion.  General: No edema.  Neurological: She isalert. And oriented x 3. Left C7. RUE and RLE 4/5. LUE trace to  0/5---perhaps trace at wrist/hand. LLE remains 3 to 3+/5 prox to distal--no changes from baseline. Normal pain and LT in all 4's without change  Skin: Skin iswarmand dry. She isnot diaphoretic. Noerythema.  Psychiatric: pleasant   Assessment/Plan: 1. Functional deficits secondary to right MCA infarct which require 3+ hours per day of interdisciplinary therapy in a comprehensive inpatient rehab setting.  Physiatrist is providing close team supervision and 24 hour management of active medical problems listed below.  Physiatrist and rehab team continue to assess barriers to discharge/monitor patient progress toward functional and  medical goals  Care Tool:  Bathing    Body parts bathed by patient: Left arm, Abdomen, Chest, Front perineal area, Face   Body parts bathed by helper: Right arm, Buttocks, Right upper leg, Left lower leg, Right lower leg, Left upper leg     Bathing assist Assist Level: Maximal Assistance - Patient 24 - 49%     Upper Body Dressing/Undressing Upper body dressing   What is the patient wearing?: Pull over shirt    Upper body assist Assist Level: Moderate Assistance - Patient 50 - 74%    Lower Body Dressing/Undressing Lower body dressing      What is the patient wearing?: Underwear/pull up, Pants     Lower body assist Assist for lower body dressing: Maximal Assistance - Patient 25 - 49%     Toileting Toileting    Toileting assist Assist for toileting: Moderate Assistance - Patient 50 - 74%     Transfers Chair/bed transfer  Transfers assist     Chair/bed transfer assist level: Minimal Assistance - Patient > 75%     Locomotion Ambulation   Ambulation assist      Assist level: Minimal Assistance - Patient > 75% Assistive device: Walker-rolling Max distance: 25'   Walk 10 feet activity   Assist     Assist level: Minimal Assistance - Patient > 75% Assistive device: Walker-rolling   Walk 50 feet activity   Assist Walk 50 feet with 2 turns activity did not occur: Safety/medical concerns         Walk 150 feet activity   Assist Walk 150 feet activity did not occur: Safety/medical concerns  Walk 10 feet on uneven surface  activity   Assist Walk 10 feet on uneven surfaces activity did not occur: Safety/medical concerns         Wheelchair     Assist Will patient use wheelchair at discharge?: No   Wheelchair activity did not occur: N/A         Wheelchair 50 feet with 2 turns activity    Assist    Wheelchair 50 feet with 2 turns activity did not occur: N/A       Wheelchair 150 feet activity     Assist   Wheelchair 150 feet activity did not occur: N/A       Blood pressure (!) 120/58, pulse 74, temperature 98.5 F (36.9 C), temperature source Oral, resp. rate 18, height 5\' 4"  (1.626 m), weight 59.2 kg, SpO2 98 %.  Medical Problem List and Plan: 1.Left-sided weaknesssecondary to recurrent right MCA infarct in setting of right ICA occlusion and severe left ICA stenosis. --Patient is beginning CIR therapies today including PT, OT, and SLP   -ELOS 14 days  -left WHO at HS 2. Antithrombotics: -DVT/anticoagulation:Lovenox -antiplatelet therapy: Aspirin 325 mg daily and Plavix 75 mg daily 3. Pain Management:Tylenol as needed 4. Mood:Provide emotional support -antipsychotic agents: N/A 5. Neuropsych: This patientiscapable of making decisions on herown behalf. 6. Skin/Wound Care:Routine skin checks 7. Fluids/Electrolytes/Nutrition: encourage PO  -intake 75-100% 8. Severe left internalcarotid stenosis. Follow-up outpatient vascular surgery Dr. Carlis Abbott to consider intervention 9. Hypertension. Norvasc 5 mg daily. Controlled at present 10. Diabetes mellitus. Hemoglobin A1c 10.3. SSI. Check blood sugars before meals and at bedtime.    -some improvement  -adjusted lantus to 20u beginning 8/19   -added PM lantus 10u beginning 8/20  -pt is on heart healthy cm diet  -continue to follow for trend CBG (last 3)  Recent Labs    01/06/19 1727 01/06/19 2230 01/07/19 0625  GLUCAP 229* 171* 183*   11. Hyperlipidemia. Lipitor 12. Hypothyroidism. Continue Synthroid     LOS: 4 days A FACE TO FACE EVALUATION WAS PERFORMED  Meredith Staggers 01/07/2019, 10:26 AM

## 2019-01-07 NOTE — Progress Notes (Signed)
Occupational Therapy Session Note  Patient Details  Name: Danielle [name] MRN: [id number] Date of Birth: [birth year]  Today's Date: 01/07/2019 OT Individual Time: 7253-6644 OT Individual Time Calculation (min): 59 min   Short Term Goals: Week 1:  OT Short Term Goal 1 (Week 1): Pt will recall hemi-dressing techniques and thread R UE into shirt with min A OT Short Term Goal 2 (Week 1): Pt will complete 1 step of LB dressing task OT Short Term Goal 3 (Week 1): Pt will demonstrate self ROM to L UE with min questioning cues  Skilled Therapeutic Interventions/Progress Updates:    Pt greeted sitting in recliner with spouse present. Pt reported need to go to the bathroom. Min A stand-pivots throughout session. Min A for balance when pt reaching to doff pants prior to sitting on commode. Set-up A to obtain toilet paper, then pt able to complete toileting. Pt agreeable to sponge bath at the sink. L UE NMR within bathing tasks with hand over hand A to push/pull with wash cloth. Educated on hemi-dressing techniques with pt needed mod A for UB and LB dressing. Faciliatted weight bearing through L UE when standing at the sink to pull up pants. OT showed pt's spouse picture of tub transfer bench and discussed DME needs/recommendations from this OT. Pt brought to therapy gym and worked on L LMR with weight bearing towel pushes. Pt able to activate flexor synergy and achieve some shoulder activation today. Pt returned to recliner at end of session with min A and left with alarm belt on and needs met.  Therapy Documentation Precautions:  Precautions Precautions: Fall Precaution Comments: L hemiparesis Restrictions Weight Bearing Restrictions: No Pain:  denies pain   Therapy/Group: Individual Therapy  Valma Cava 01/07/2019, 2:32 PM

## 2019-01-07 NOTE — ED Provider Notes (Signed)
[facility] EMERGENCY DEPARTMENT Provider Note   CSN: [id number] Arrival date & time: 12/27/18  1434     History   Chief Complaint Chief Complaint  Patient presents with  . Weakness    HPI Danielle [name] is a 85 y.o. female.     HPI   61yF with continued L sided weakness. I actually saw her a couple days ago when admitted for CVA. Discharged today from hospital. [facility] home and family felt like she needed a lot of assistance. Pt denies new complaints since left hospital.   Past Medical History:  Diagnosis Date  . Arthritis    "some joint pain once in awhile" (05/26/2017)  . CVA (cerebral vascular accident) ([place]) 2019  . History of kidney stones   . Hyperlipemia   . Hypertension   . Hypothyroidism (acquired)   . Pneumothorax 02/05/2017   right-after fall  . Rib fractures 02/05/2017   right side  . Stroke ([place])   . Type II diabetes mellitus [facility])     Patient Active Problem List   Diagnosis Date Noted  . Right middle cerebral artery stroke ([place]) 01/03/2019  . Carotid stenosis, bilateral 12/30/2018  . Stroke ([place]) 12/24/2018  . Stroke-like symptoms 12/23/2018  . HCAP (healthcare-associated pneumonia) 06/09/2018  . Severe sepsis ([place]) 06/09/2018  . Generalized weakness 06/09/2018  . Right middle lobe pneumonia ([place]) 06/08/2018  . Acute CVA (cerebrovascular accident) ([place]) 05/03/2018  . Hypothyroidism (acquired) 05/03/2018  . Hypertension 05/03/2018  . Uncontrolled type 2 diabetes mellitus with diabetic nephropathy, with long-term current use of insulin ([place])   . UTI due to extended-spectrum beta lactamase (ESBL) producing Escherichia coli   . Pneumothorax 02/05/2017  . Multiple rib fractures 02/05/2017  . Type 2 diabetes mellitus with hypoglycemia without coma ([place]) 02/05/2017  . Clavicle fracture 02/05/2017  . Elevated blood pressure reading 02/05/2017  . UTI (urinary tract infection) 02/05/2017  . Back pain at L4-L5 level 06/14/2013  . Muscle weakness  (generalized) 12/17/2012  . Pain in joint, shoulder region 12/17/2012  . Shoulder fracture 12/14/2012  . Proximal humerus fracture 11/29/2012  . FRACTURE, TOE, RIGHT 09/10/2009    Past Surgical History:  Procedure Laterality Date  . AUGMENTATION MAMMAPLASTY Bilateral   . DILATION AND CURETTAGE OF UTERUS    . FRACTURE SURGERY    . REVERSE SHOULDER ARTHROPLASTY Right 05/26/2017  . REVERSE SHOULDER ARTHROPLASTY Right 05/26/2017   Procedure: REVERSE RIGHT SHOULDER ARTHROPLASTY;  Surgeon: Meredith Pel, MD;  Location: [place];  Service: Orthopedics;  Laterality: Right;  . TONSILLECTOMY    . TUBAL LIGATION       OB History    Gravida      Para      Term      Preterm      AB      Living  2     SAB      TAB      Ectopic      Multiple      Live Births               Home Medications    Prior to Admission medications   Medication Sig Start Date End Date Taking? Authorizing Provider  amLODipine (NORVASC) 5 MG tablet Take 1 tablet (5 mg total) by mouth daily. 12/27/18 12/27/19  Sinda Du, MD  aspirin 325 MG tablet Take 1 tablet (325 mg total) by mouth daily. 12/28/18   Sinda Du, MD  atorvastatin (LIPITOR) 80 MG tablet Take 1 tablet (  80 mg total) by mouth daily at 6 PM. 01/03/19   Monica Becton, MD  Calcium Carb-Cholecalciferol (CALCIUM 600 + D PO) Take 1 tablet by mouth daily.    [provider]  insulin aspart (NOVOLOG) 100 UNIT/ML injection Inject 0-9 Units into the skin every 4 (four) hours. 01/03/19   Monica Becton, MD  insulin lispro (HUMALOG) 100 UNIT/ML injection Inject 12 Units into the skin daily.     [provider]  Omega-3 Fatty Acids (FISH OIL) 1000 MG CAPS Take 1,000 mg by mouth daily.    [provider]  SYNTHROID 100 MCG tablet Take 100 mcg by mouth every morning.  08/21/18   [provider]  TOUJEO SOLOSTAR 300 UNIT/ML SOPN Inject 20-22 Units into the skin every morning.  09/07/18   [provider]  vitamin B-12 (CYANOCOBALAMIN) 1000 MCG tablet Take 1,000 mcg by mouth daily.    [provider]    Family History History reviewed. No pertinent family history.  Social History Social History   Tobacco Use  . Smoking status: Former Smoker    Packs/day: 1.00    Years: 40.00    Pack years: 40.00    Types: Cigarettes    Quit date: 1991    Years since quitting: 29.6  . Smokeless tobacco: Never Used  Substance Use Topics  . Alcohol use: No  . Drug use: No     Allergies   Fosamax [alendronate sodium] and Prednisone   Review of Systems Review of Systems  All systems reviewed and negative, other than as noted in HPI.  Physical Exam Updated Vital Signs BP (!) 149/64   Pulse 76   Temp 98 F (36.7 C) (Oral)   Resp 18   Ht 5\' 5"  (1.651 m)   Wt 59.9 kg   SpO2 93%   BMI 21.97 kg/m   Physical Exam Vitals signs and nursing note reviewed.  Constitutional:      General: She is not in acute distress.    Appearance: She is well-developed.  HENT:     Head: Normocephalic and atraumatic.  Eyes:     General:        Right eye: No discharge.        Left eye: No discharge.     Conjunctiva/sclera: Conjunctivae normal.  Neck:     Musculoskeletal: Neck supple.  Cardiovascular:     Rate and Rhythm: Normal rate and regular rhythm.     Heart sounds: Normal heart sounds. No murmur. No friction rub. No gallop.   Pulmonary:     Effort: Pulmonary effort is normal. No respiratory distress.     Breath sounds: Normal breath sounds.  Abdominal:     General: There is no distension.     Palpations: Abdomen is soft.     Tenderness: There is no abdominal tenderness.  Musculoskeletal:        General: No tenderness.  Skin:    General: Skin is warm and dry.  Neurological:     Mental Status: She is alert.     Comments: LUE weakness  Psychiatric:        Behavior: Behavior normal.        Thought Content: Thought content normal.      ED Treatments / Results   Labs (all labs ordered are listed, but only abnormal results are displayed) Labs Reviewed - No data to display  EKG EKG Interpretation  Date/Time:  Monday December 27 2018 14:53:11 EDT Ventricular Rate:  77 PR  Interval:    QRS Duration: 139 QT Interval:  433 QTC Calculation: 491 R Axis:   -68 Text Interpretation:  Sinus rhythm Left bundle branch block Confirmed by Virgel Manifold [phone]) on 12/27/2018 3:21:06 PM   Radiology No results found.  Procedures Procedures (including critical care time)  Medications Ordered in ED Medications - No data to display   Initial Impression / Assessment and Plan / ED Course  I have reviewed the triage vital signs and the nursing notes.  Pertinent labs & imaging results that were available during my care of the patient were reviewed by me and considered in my medical decision making (see chart for details).       86yF with symptoms consistent with recent CVA and deconditioning. She declined placement. [facility] services arranged. Discussed with patient. I do not feel that additional medical w/u is indicated at this time. Tried to encourage. If she/family continues to feel like that she needs additional care than what can be provided at home then she needs to discuss with her PCP with regards to placement.   Final Clinical Impressions(s) / ED Diagnoses   Final diagnoses:  Generalized weakness  Cerebrovascular accident (CVA), unspecified mechanism [facility])    ED Discharge Orders    None       Virgel Manifold, MD 01/07/19 1525

## 2019-01-08 DIAGNOSIS — Z794 Long term (current) use of insulin: Secondary | ICD-10-CM

## 2019-01-08 DIAGNOSIS — E1165 Type 2 diabetes mellitus with hyperglycemia: Secondary | ICD-10-CM

## 2019-01-08 DIAGNOSIS — E1121 Type 2 diabetes mellitus with diabetic nephropathy: Secondary | ICD-10-CM

## 2019-01-08 DIAGNOSIS — I6523 Occlusion and stenosis of bilateral carotid arteries: Secondary | ICD-10-CM

## 2019-01-08 LAB — GLUCOSE, CAPILLARY
Glucose-Capillary: 188 mg/dL — ABNORMAL HIGH (ref 70–99)
Glucose-Capillary: 177 mg/dL — ABNORMAL HIGH (ref 70–99)
Glucose-Capillary: 183 mg/dL — ABNORMAL HIGH (ref 70–99)
Glucose-Capillary: 225 mg/dL — ABNORMAL HIGH (ref 70–99)

## 2019-01-08 NOTE — Progress Notes (Signed)
ANGELIZ AUG is a 85 y.o. female [birth year] [id number]  Subjective: Anxious to begin therapy sessions today.  Encouraged by slow progress but frustrated with continued left upper extremity and hand deficits.  Denies pain or new problems  Objective: Vital signs in last 24 hours: Temp:  [98 F (36.7 C)-98.7 F (37.1 C)] 98.7 F (37.1 C) (08/22 0258) Pulse Rate:  [68-73] 69 (08/22 0258) Resp:  [18-19] 18 (08/22 0258) BP: (123-143)/(51-102) 142/51 (08/22 0258) SpO2:  [96 %-100 %] 96 % (08/22 0258) Weight change:  Last BM Date: 01/05/19  Intake/Output from previous day: 08/21 0701 - 08/22 0700 In: 881 [P.O.:881] Out: -   Physical Exam General: No apparent distress   Sitting in [facility] Lungs: Normal effort. Lungs clear to auscultation, no crackles or wheezes. Cardiovascular: Regular rate and rhythm, no edema Neurological: No new neurological deficits L HP unchanged   Lab Results: BMET    Component Value Date/Time   NA 139 01/04/2019 0530   K 3.5 01/04/2019 0530   CL 107 01/04/2019 0530   CO2 24 01/04/2019 0530   GLUCOSE 247 (H) 01/04/2019 0530   BUN 18 01/04/2019 0530   CREATININE 0.96 01/04/2019 0530   CALCIUM 9.1 01/04/2019 0530   GFRNONAA 54 (L) 01/04/2019 0530   GFRAA >60 01/04/2019 0530   CBC    Component Value Date/Time   WBC 5.9 01/04/2019 0530   RBC 4.00 01/04/2019 0530   HGB 12.0 01/04/2019 0530   HCT 36.4 01/04/2019 0530   PLT 301 01/04/2019 0530   MCV 91.0 01/04/2019 0530   MCH 30.0 01/04/2019 0530   MCHC 33.0 01/04/2019 0530   RDW 14.6 01/04/2019 0530   LYMPHSABS 1.4 01/04/2019 0530   MONOABS 0.6 01/04/2019 0530   EOSABS 0.3 01/04/2019 0530   BASOSABS 0.1 01/04/2019 0530   CBG's (last 3):   Recent Labs    01/07/19 1656 01/07/19 2135 01/08/19 0623  GLUCAP 196* 166* 188*   LFT's Lab Results  Component Value Date   ALT 36 01/04/2019   AST 51 (H) 01/04/2019   ALKPHOS 101 01/04/2019   BILITOT 0.7 01/04/2019    Studies/Results: No  results found.  Medications:  I have reviewed the patient's current medications. Scheduled Medications: . amLODipine  5 mg Oral Daily  . aspirin  325 mg Oral Daily  . atorvastatin  80 mg Oral q1800  . clopidogrel  75 mg Oral Daily  . enoxaparin (LOVENOX) injection  40 mg Subcutaneous Q24H  . insulin aspart  0-5 Units Subcutaneous QHS  . insulin aspart  0-9 Units Subcutaneous TID WC  . insulin glargine  10 Units Subcutaneous Daily  . insulin glargine  20 Units Subcutaneous Daily  . levothyroxine  100 mcg Oral q morning - 10a  . multivitamin  1 tablet Oral Daily  . vitamin B-12  1,000 mcg Oral Daily   PRN Medications: acetaminophen **OR** acetaminophen, sorbitol  Assessment/Plan: Principal Problem:   Right middle cerebral artery stroke (HCC) Active Problems:   Uncontrolled type 2 diabetes mellitus with diabetic nephropathy, with long-term current use of insulin (HCC)   Hypertension   Carotid stenosis, bilateral   Length of stay, days: 5  1.  Left hemiparesis following recurrent right MCA stroke in setting of right ICA occlusion and severe left ICA stenosis.  Continue left WHO at bedtime and ongoing intensive inpatient rehab with PT and OT support as ongoing.  Medical management for risk factors as addressed 2.  Type 2 diabetes, uncontrolled with A1c of 10.3  prior to admission.  Continue adjustment of Lantus with scheduled meal coverage and ongoing sliding scale and carb modified diet with nutritional education. 3.  Hypertension.  Continue home medication.  Blood pressure reasonably controlled at present 4.  Severe left ICA stenosis.  Outpatient vascular surgery follow-up as planned 5.  Hypothyroidism.  Continue hormone replacement as scheduled   A. Asa Lente, MD 01/08/2019, 12:09 PM

## 2019-01-08 NOTE — Plan of Care (Signed)
  Problem: RH BOWEL ELIMINATION Goal: RH STG MANAGE BOWEL WITH ASSISTANCE Description: STG Manage Bowel with Mod I Assistance. Outcome: Progressing Goal: RH STG MANAGE BOWEL W/MEDICATION W/ASSISTANCE Description: STG Manage Bowel with Medication with Mod I Assistance. Outcome: Progressing   Problem: RH BLADDER ELIMINATION Goal: RH STG MANAGE BLADDER WITH ASSISTANCE Description: STG Manage Bladder With Mod I Assistance Outcome: Progressing   Problem: RH SAFETY Goal: RH STG ADHERE TO SAFETY PRECAUTIONS W/ASSISTANCE/DEVICE Description: STG Adhere to Safety Precautions With Mod I Assistance/Device. Outcome: Progressing   Problem: Consults Goal: RH STROKE PATIENT EDUCATION Description: Patient/family will be able to verbalize prevention of stroke, medications, and self care after discharge with cues/handouts Outcome: Progressing

## 2019-01-09 ENCOUNTER — Inpatient Hospital Stay (HOSPITAL_COMMUNITY): Payer: Medicare Other

## 2019-01-09 LAB — GLUCOSE, CAPILLARY
Glucose-Capillary: 104 mg/dL — ABNORMAL HIGH (ref 70–99)
Glucose-Capillary: 381 mg/dL — ABNORMAL HIGH (ref 70–99)
Glucose-Capillary: 208 mg/dL — ABNORMAL HIGH (ref 70–99)
Glucose-Capillary: 221 mg/dL — ABNORMAL HIGH (ref 70–99)

## 2019-01-09 NOTE — Progress Notes (Signed)
Danielle [name] is a 85 y.o. female December 10, 1931 [id number]  Subjective: Anxious to begin therapy sessions today.  Encouraged by slow progress but frustrated with continued left upper extremity and hand deficits.  Denies pain or new problems  Objective: Vital signs in last 24 hours: Temp:  [97.6 F (36.4 C)-98.1 F (36.7 C)] 98 F (36.7 C) (08/23 0421) Pulse Rate:  [72-77] 77 (08/23 0421) Resp:  [18-19] 19 (08/23 0421) BP: (115-153)/(54-59) 115/54 (08/23 0421) SpO2:  [96 %-100 %] 99 % (08/23 0421) Weight change:  Last BM Date: 01/08/19(per pt report)  Intake/Output from previous day: 08/22 0701 - 08/23 0700 In: 600 [P.O.:600] Out: -   Physical Exam General: No apparent distress   Sitting in [facility] Lungs: Normal effort. Lungs clear to auscultation, no crackles or wheezes. Cardiovascular: Regular rate and rhythm, no edema Neurological: No new neurological deficits L HP unchanged   Lab Results: BMET    Component Value Date/Time   NA 139 01/04/2019 0530   K 3.5 01/04/2019 0530   CL 107 01/04/2019 0530   CO2 24 01/04/2019 0530   GLUCOSE 247 (H) 01/04/2019 0530   BUN 18 01/04/2019 0530   CREATININE 0.96 01/04/2019 0530   CALCIUM 9.1 01/04/2019 0530   GFRNONAA 54 (L) 01/04/2019 0530   GFRAA >60 01/04/2019 0530   CBC    Component Value Date/Time   WBC 5.9 01/04/2019 0530   RBC 4.00 01/04/2019 0530   HGB 12.0 01/04/2019 0530   HCT 36.4 01/04/2019 0530   PLT 301 01/04/2019 0530   MCV 91.0 01/04/2019 0530   MCH 30.0 01/04/2019 0530   MCHC 33.0 01/04/2019 0530   RDW 14.6 01/04/2019 0530   LYMPHSABS 1.4 01/04/2019 0530   MONOABS 0.6 01/04/2019 0530   EOSABS 0.3 01/04/2019 0530   BASOSABS 0.1 01/04/2019 0530   CBG's (last 3):   Recent Labs    01/08/19 1704 01/08/19 2107 01/09/19 0609  GLUCAP 183* 225* 104*   LFT's Lab Results  Component Value Date   ALT 36 01/04/2019   AST 51 (H) 01/04/2019   ALKPHOS 101 01/04/2019   BILITOT 0.7 01/04/2019     Studies/Results: No results found.  Medications:  I have reviewed the patient's current medications. Scheduled Medications: . amLODipine  5 mg Oral Daily  . aspirin  325 mg Oral Daily  . atorvastatin  80 mg Oral q1800  . clopidogrel  75 mg Oral Daily  . enoxaparin (LOVENOX) injection  40 mg Subcutaneous Q24H  . insulin aspart  0-5 Units Subcutaneous QHS  . insulin aspart  0-9 Units Subcutaneous TID WC  . insulin glargine  10 Units Subcutaneous Daily  . insulin glargine  20 Units Subcutaneous Daily  . levothyroxine  100 mcg Oral q morning - 10a  . multivitamin  1 tablet Oral Daily  . vitamin B-12  1,000 mcg Oral Daily   PRN Medications: acetaminophen **OR** acetaminophen, sorbitol  Assessment/Plan: Principal Problem:   Right middle cerebral artery stroke (HCC) Active Problems:   Uncontrolled type 2 diabetes mellitus with diabetic nephropathy, with long-term current use of insulin (HCC)   Hypertension   Carotid stenosis, bilateral   Length of stay, days: 6  1.  Left hemiparesis following recurrent right MCA stroke in setting of right ICA occlusion and severe left ICA stenosis.  Continue left WHO at bedtime and ongoing intensive inpatient rehab with PT and OT support as ongoing.  Medical management for risk factors as addressed 2.  Type 2 diabetes, uncontrolled with A1c  of 10.3 prior to admission.  Continue adjustment of Lantus with scheduled meal coverage and ongoing sliding scale and carb modified diet with nutritional education. 3.  Hypertension.  Continue home medication.  Blood pressure reasonably controlled at present 4.  Severe left ICA stenosis.  Outpatient vascular surgery follow-up as planned 5.  Hypothyroidism.  Continue hormone replacement as scheduled  [name] A. Asa Lente, MD 01/09/2019, 10:20 AM

## 2019-01-09 NOTE — Plan of Care (Signed)
  Problem: RH BOWEL ELIMINATION Goal: RH STG MANAGE BOWEL WITH ASSISTANCE Description: STG Manage Bowel with Mod I Assistance. Outcome: Progressing Goal: RH STG MANAGE BOWEL W/MEDICATION W/ASSISTANCE Description: STG Manage Bowel with Medication with Mod I Assistance. Outcome: Progressing   Problem: RH BLADDER ELIMINATION Goal: RH STG MANAGE BLADDER WITH ASSISTANCE Description: STG Manage Bladder With Mod I Assistance Outcome: Progressing   Problem: RH SAFETY Goal: RH STG ADHERE TO SAFETY PRECAUTIONS W/ASSISTANCE/DEVICE Description: STG Adhere to Safety Precautions With Mod I Assistance/Device. Outcome: Progressing   Problem: Consults Goal: RH STROKE PATIENT EDUCATION Description: Patient/family will be able to verbalize prevention of stroke, medications, and self care after discharge with cues/handouts Outcome: Progressing

## 2019-01-09 NOTE — Progress Notes (Signed)
Occupational Therapy Session Note  Patient Details  Name: Danielle [name] MRN: [id number] Date of Birth: December 15, 1931  Today's Date: 01/09/2019 OT Individual Time: 0700-0813 OT Individual Time Calculation (min): 73 min    Short Term Goals: Week 1:  OT Short Term Goal 1 (Week 1): Pt will recall hemi-dressing techniques and thread R UE into shirt with min A OT Short Term Goal 2 (Week 1): Pt will complete 1 step of LB dressing task OT Short Term Goal 3 (Week 1): Pt will demonstrate self ROM to L UE with min questioning cues  Skilled Therapeutic Interventions/Progress Updates:    1:1.Pt with no report of pain. Pt agreeable to sink level bathing and dressing.pt supine>sitting EOB with A for trunk elevation. Pt completes stand pivot transfer EOB>w/<>BSC withVC  For hand placemetn. Pt completes posterior hygiene with encouragement to try in standing with MIN A d/t pt believing she cant do because 1UE doesn't work. Pt bathes seated at sink with HOH A to wash RUE with LUE for NMR. Pt requires VC for scooting to EOC and pushing from arm rest at sink to sit to stand with min facilitation for weight shifting. Pt requires MOD A and max VC/encouragement to keep trying hemi technqiues to don shirt. MIN A to advance pants up past L hip. MAX VC and min A to don L sock with 1 handed technique. Pt able to don R sock without physical A. Pt states, "I dont like to do things that arent half easy even before this happened." pt grooms at sink with set up using 1 handed strategies. Pt completes 3 sit to stand at high low table with cuing for reciprocal scoot to EOC, anterior weight shifting and hand palcement with LUE  In WB position on table. Exited session with pt seated in w/c, call light in reach and belt alarm on.   Therapy Documentation Precautions:  Precautions Precautions: Fall Precaution Comments: L hemiparesis Restrictions Weight Bearing Restrictions: No General:   Vital Signs: Therapy Vitals Temp: 98  F (36.7 C) Pulse Rate: 77 Resp: 19 BP: (!) 115/54 Patient Position (if appropriate): Sitting Oxygen Therapy SpO2: 99 % O2 Device: Room Air Pain:   ADL: ADL Eating: Minimal assistance Grooming: Minimal assistance Upper Body Bathing: Moderate assistance Lower Body Bathing: Maximal assistance Upper Body Dressing: Moderate assistance Lower Body Dressing: Maximal assistance Toileting: Moderate assistance Toilet Transfer: Minimal assistance Toilet Transfer Method: Stand pivot Tub/Shower Transfer: Moderate assistance Tub/Shower Equipment: Nurse, learning disability    Praxis   Exercises:   Other Treatments:     Therapy/Group: Individual Therapy  Tonny Branch 01/09/2019, 7:10 AM

## 2019-01-09 NOTE — Progress Notes (Signed)
Physical Therapy Session Note  Patient Details  Name: Danielle [name] MRN: [id number] Date of Birth: [birth year]  Today's Date: 01/09/2019 PT Individual Time: 1002-1100 PT Individual Time Calculation (min): 58 min   Short Term Goals: Week 1:  PT Short Term Goal 1 (Week 1): Pt will ambulate 25' w/ min assist PT Short Term Goal 2 (Week 1): Pt will perform dynamic standing balance tasks w/ min assist PT Short Term Goal 3 (Week 1): Pt will negotiate 2 steps w/ R rail w/ min assist  Skilled Therapeutic Interventions/Progress Updates:    Pt seated in w/c upon PT arrival, agreeable to therapy tx and denies pain at rest. Pt transported to the gym. Pt ambulated x 30 ft this session with RW and min assist, cues for RW management during turns, manual facilitation for increased R lateral weightshift during L swing phase.  Pt seated edge of mat worked on L UE PROM and stretching into wrist/finger extension, elbow extension and shoulder ER in a weightbearing position. Pt performed 2 x 10 sit<>stands without UE support working on balance and LE strength, pt with posterior lean requiring cues to correct. Pt worked on dynamic standing balance and pre-gait without an AD in order to perform x 10 steps in place with each LE and x 10 lateral steps in place with each LE, manual faciltiation for R lateral weightshift when stepping with L LE, min-mod assist. For neuro re-ed pt performed standing L LE hamstring curls with UE support on RW, 2 x 10 with cues for techniques. Pt continued to work on R lateral weightshifting and L foot clearance to perform 2 x 10 toe taps on aerobic step with L LE, min-mod assist. Pt ambulated x 10 ft without an AD, therapist noted L LE hyperextension during stance due to lack of knee control and weakness, as well as trendelenburg present secondary to L hip abductor weakness. Pt performed standing L LE hip abduction with R UE support on RW working on hip strengthening, 2 x 10 with cues for  techniques. Stand pivot to w/c with CGA, transported back to room and performed stand pivot to toilet with min assist, continent of bladder with min assist for clothing management. Pt left in w/c with needs in reach and chair alarm set.   Therapy Documentation Precautions:  Precautions Precautions: Fall Precaution Comments: L hemiparesis Restrictions Weight Bearing Restrictions: No    Therapy/Group: Individual Therapy  Netta Corrigan, PT, DPT 01/09/2019, 7:05 AM

## 2019-01-09 NOTE — Progress Notes (Signed)
Occupational Therapy Session Note  Patient Details  Name: Danielle [name] MRN: [id number] Date of Birth: Sep 25, 1931  Today's Date: 01/09/2019 OT Individual Time: 1500-1600 OT Individual Time Calculation (min): 60 min    Skilled Therapeutic Interventions/Progress Updates:    1:1. Pt received in bed with husband present asleep. Pt completes supine>sitting EOB with MOD A and VC for reciprocal scooting to EOB. With use of bed rail pt completes stand pivot transfer to w/c with VC for reach back. Pt with no pain and requesting to complete estim. Applied 1:1 NMES to wrist extensors for 12 min and digit flexors for 12 min  extensors Ratio 1:1 Rate 35 pps Waveform- Asymmetric Ramp 1.0 Pulse 300 CH1 Intensity- 27     Duration -  12  flexors Intensity- 29 Duration -  12  No redness or skin breakdown present after estim.  Pt unable to elicit movement voluntarily.  Pt completes 2x15 arm skate with tactile input at scapula horizontal ab/adduction, shoulder felx/ext, elbow flex/ext, internal/ext rotation, and circles in B directions for WB and NMR of LUE.  Pt left in w/c, chair alarm on and needs in reach.  Therapy Documentation Precautions:  Precautions Precautions: Fall Precaution Comments: L hemiparesis Restrictions Weight Bearing Restrictions: No General:   Vital Signs:  Pain:   ADL: ADL Eating: Minimal assistance Grooming: Minimal assistance Upper Body Bathing: Moderate assistance Lower Body Bathing: Maximal assistance Upper Body Dressing: Moderate assistance Lower Body Dressing: Maximal assistance Toileting: Moderate assistance Toilet Transfer: Minimal assistance Toilet Transfer Method: Stand pivot Tub/Shower Transfer: Moderate assistance Tub/Shower Equipment: Nurse, learning disability    Praxis   Exercises:   Other Treatments:     Therapy/Group: Individual Therapy  Tonny Branch 01/09/2019, 4:11 PM

## 2019-01-10 ENCOUNTER — Inpatient Hospital Stay (HOSPITAL_COMMUNITY): Payer: Medicare Other

## 2019-01-10 ENCOUNTER — Inpatient Hospital Stay (HOSPITAL_COMMUNITY): Payer: Medicare Other | Admitting: Occupational Therapy

## 2019-01-10 ENCOUNTER — Inpatient Hospital Stay (HOSPITAL_COMMUNITY): Payer: Medicare Other | Admitting: Physical Therapy

## 2019-01-10 LAB — GLUCOSE, CAPILLARY
Glucose-Capillary: 206 mg/dL — ABNORMAL HIGH (ref 70–99)
Glucose-Capillary: 255 mg/dL — ABNORMAL HIGH (ref 70–99)
Glucose-Capillary: 273 mg/dL — ABNORMAL HIGH (ref 70–99)
Glucose-Capillary: 249 mg/dL — ABNORMAL HIGH (ref 70–99)

## 2019-01-10 MED ORDER — INSULIN GLARGINE 100 UNIT/ML ~~LOC~~ SOLN
14.0000 [IU] | Freq: Every day | SUBCUTANEOUS | Status: DC
  Administered 2019-01-10 – 2019-01-11 (×2): 14 [IU] via SUBCUTANEOUS
  Filled 2019-01-10 (×3): qty 0.14

## 2019-01-10 MED ORDER — INSULIN GLARGINE 100 UNIT/ML ~~LOC~~ SOLN
24.0000 [IU] | Freq: Every day | SUBCUTANEOUS | Status: DC
  Administered 2019-01-11 – 2019-01-12 (×2): 24 [IU] via SUBCUTANEOUS
  Filled 2019-01-10 (×2): qty 0.24

## 2019-01-10 NOTE — Progress Notes (Signed)
Occupational Therapy Session Note  Patient Details  Name: Danielle [name] MRN: [id number] Date of Birth: [birth year]  Today's Date: 01/10/2019 OT Individual Time: 1530-1630 OT Individual Time Calculation (min): 60 min    Short Term Goals: Week 1:  OT Short Term Goal 1 (Week 1): Pt will recall hemi-dressing techniques and thread R UE into shirt with min A OT Short Term Goal 2 (Week 1): Pt will complete 1 step of LB dressing task OT Short Term Goal 3 (Week 1): Pt will demonstrate self ROM to L UE with min questioning cues  Skilled Therapeutic Interventions/Progress Updates:    Pt received sitting up in recliner with no c/o pain, agreeable to session. Pt completed stand pivot transfer to w/c with mod A, min cueing for hand placement. Pt was transported to the therapy gym for time management. Pt used RW, requiring cueing/min A for use of RW orthosis on LUE for sit <> stand with min A. Pt completed 40 ft of functional mobility with min A, occasionally requiring mod A d/t LOB/ LLE toe drag. Pt sat EOM and completed LUE weightbearing activity to increase proprioceptive input to L GH joint. Pt reported occasional pain in her L thumb but it alleviated with repositioning. Pt completed scapular AROM with LUE supported in mobile arm support. Pt was also guided in lat. Dorsi activation with straight arm push downs in mobile arm support. Pt very pleased with her progress overall. Pt returned to room and completed ambulatory transfer into bathroom requiring mod A for balance support and RW management when navigating threshold. Pt completed all toileting tasks with min A. Pt returned to recliner and was left sitting up with husband present. Chair alarm belt fastened.   Therapy Documentation Precautions:  Precautions Precautions: Fall Precaution Comments: L hemiparesis Restrictions Weight Bearing Restrictions: No  Therapy/Group: Individual Therapy  Curtis Sites 01/10/2019, 7:30 AM

## 2019-01-10 NOTE — Progress Notes (Signed)
Occupational Therapy Session Note  Patient Details  Name: Danielle [name] MRN: [id number] Date of Birth: 1931-09-05  Today's Date: 01/10/2019 OT Individual Time: 1100-1202 OT Individual Time Calculation (min): 62 min    Short Term Goals: Week 1:  OT Short Term Goal 1 (Week 1): Pt will recall hemi-dressing techniques and thread R UE into shirt with min A OT Short Term Goal 2 (Week 1): Pt will complete 1 step of LB dressing task OT Short Term Goal 3 (Week 1): Pt will demonstrate self ROM to L UE with min questioning cues  Skilled Therapeutic Interventions/Progress Updates:    patient seated in w/c and ready for therapy session, husband present.  Completed hand washing at sink with min a.  Short distance ambulation x3 t/o session with CGA RW with left hand assist, cues for directionality at times.  Overall good awareness of left UE.  Completed unsupported sitting on mat with focus on posture, core mobility/balance and scapular function - DS for static sitting, CS for dynamic seated activity.  Noted scapular mobility and able to facilitate triceps with weight bearing and against gravity in supine position.  Flexors continue to require inhibition for positioning.  Supine to/from SSP on mat with min A.  Ambulation to recliner in patient room with RW CGA, min cues.  Reviewed mobility and weight bearing/posture activities with patient's husband.  She remained seated in recliner at close of session with seat belt alarm set and tray table/call bell in reach.    Therapy Documentation Precautions:  Precautions Precautions: Fall Precaution Comments: L hemiparesis Restrictions Weight Bearing Restrictions: No General:   Vital Signs:  Pain: Pain Assessment Pain Scale: 0-10 Pain Score: 0-No pain   Other Treatments:     Therapy/Group: Individual Therapy  Carlos Levering 01/10/2019, 12:19 PM

## 2019-01-10 NOTE — Progress Notes (Signed)
Orthopedic Tech Progress Note Patient Details:  Danielle [name] [birth year] [id number] Called [facility] for AFO. Patient ID: [name], female   DOB: December 31, 1931, 85 y.o.   MRN: [id number]   [name] T 01/10/2019, 9:58 AM

## 2019-01-10 NOTE — Progress Notes (Addendum)
Physical Therapy Weekly Progress Note  Patient Details  Name: Danielle [name] MRN: [id number] Date of Birth: 10/26/1931  Beginning of progress report period: January 04, 2019 End of progress report period: January 10, 2019  Today's Date: 01/10/2019 PT Individual Time: 1552-0802 PT Individual Time Calculation (min): 70 min   Patient has met 3 of 3 short term goals. Pt is progressing well w/ therapies, performing all mobility w/ CGA-min assist including gait up to 25' w/ RW and stair negotiation. Her dynamic standing balance has greatly improved since evaluation, as she is able to perform w/ CGA-min assist consistently.  Patient continues to demonstrate the following deficits muscle weakness, decreased cardiorespiratoy endurance, decreased attention to left and decreased motor planning and decreased sitting balance, decreased standing balance, decreased postural control, hemiplegia and decreased balance strategies and therefore will continue to benefit from skilled PT intervention to increase functional independence with mobility, as well as decrease risk of falls upon d/c.   Patient progressing toward long term goals..  Continue plan of care.  PT Short Term Goals Week 1:  PT Short Term Goal 1 (Week 1): Pt will ambulate 25' w/ min assist PT Short Term Goal 1 - Progress (Week 1): Met PT Short Term Goal 2 (Week 1): Pt will perform dynamic standing balance tasks w/ min assist PT Short Term Goal 2 - Progress (Week 1): Met PT Short Term Goal 3 (Week 1): Pt will negotiate 2 steps w/ R rail w/ min assist PT Short Term Goal 3 - Progress (Week 1): Met Week 2:  PT Short Term Goal 1 (Week 2): =LTGs due to ELOS  Skilled Therapeutic Interventions/Progress Updates:   Pt in w/c and agreeable to therapy, no c/o pain. Total assist w/c transport to/from therapy gym. Worked on gait training and stair negotiation w/ gym shoes on. Ambulated 25' x2 w/ CGA-min assist, assist mostly needed for upright balance  and RW management. Pt more consistently clearing L foot from floor w/ min verbal cues. Negotiated 3 steps w/ R rail (descended backwards), min assist and verbal cues for pattern. Worked on dynamic standing balance and balance strategies w/ L weight shifting while standing to match playing cards and w/ lateral weight shifting, no UE support. Min assist at first, fading to [facility] w/ verbal/tactile cues for hip weight shifting strategies. Pt tends to over-correct w/ R stepping strategy and has no activation of hip strategy unless cued. NuStep 5 min @ level 1 w/ BLEs and RUE, 3 min @ level 1 w/ BLEs only to work on LLE NMR and reciprocal movement pattern. Returned to room and ended session in w/c, all needs in reach.   Therapy Documentation Precautions:  Precautions Precautions: Fall Precaution Comments: L hemiparesis Restrictions Weight Bearing Restrictions: No   Therapy/Group: Individual Therapy  Siddharth Babington [name] 01/10/2019, 9:44 AM

## 2019-01-10 NOTE — Progress Notes (Signed)
[facility] PHYSICAL MEDICINE & REHABILITATION PROGRESS NOTE   Subjective/Complaints: No new issues. Frustrated that left arm is not coming around faster.   ROS: Patient denies fever, rash, sore throat, blurred vision, nausea, vomiting, diarrhea, cough, shortness of breath or chest pain, joint or back pain, headache, or mood change.    Objective:   No results found. No results for input(s): WBC, HGB, HCT, PLT in the last 72 hours. No results for input(s): NA, K, CL, CO2, GLUCOSE, BUN, CREATININE, CALCIUM in the last 72 hours.  Intake/Output Summary (Last 24 hours) at 01/10/2019 1028 Last data filed at 01/10/2019 0816 Gross per 24 hour  Intake 480 ml  Output -  Net 480 ml     Physical Exam: Vital Signs Blood pressure (!) 133/47, pulse 73, temperature 98.1 F (36.7 C), resp. rate 17, height 5\' 4"  (1.626 m), weight 59.2 kg, SpO2 98 %.  Constitutional: No distress . Vital signs reviewed. HEENT: EOMI, oral membranes moist Neck: supple Cardiovascular: RRR without murmur. No JVD    Respiratory: CTA Bilaterally without wheezes or rales. Normal effort    GI: BS +, non-tender, non-distended  Musculoskeletal:Normal range of motion. no shoulder pain General: No edema.  Neurological: She isalert. And oriented x 3. Left C7. RUE and RLE 4/5. LUE trace to  0/5---don't see a change today.  LLE remains 3 to 3+/5 prox to distal--no changes from baseline. Normal pain and LT in all 4's without change  Skin: Skin iswarmand dry. She isnot diaphoretic. Noerythema.  Psychiatric: pleasant   Assessment/Plan: 1. Functional deficits secondary to right MCA infarct which require 3+ hours per day of interdisciplinary therapy in a comprehensive inpatient rehab setting.  Physiatrist is providing close team supervision and 24 hour management of active medical problems listed below.  Physiatrist and rehab team continue to assess barriers to discharge/monitor patient progress toward functional and  medical goals  Care Tool:  Bathing    Body parts bathed by patient: Left arm, Abdomen, Chest, Front perineal area, Face   Body parts bathed by helper: Right arm, Buttocks, Right upper leg, Left lower leg, Right lower leg, Left upper leg     Bathing assist Assist Level: Maximal Assistance - Patient 24 - 49%     Upper Body Dressing/Undressing Upper body dressing   What is the patient wearing?: Pull over shirt    Upper body assist Assist Level: Moderate Assistance - Patient 50 - 74%    Lower Body Dressing/Undressing Lower body dressing      What is the patient wearing?: Underwear/pull up, Pants     Lower body assist Assist for lower body dressing: Maximal Assistance - Patient 25 - 49%     Toileting Toileting    Toileting assist Assist for toileting: Moderate Assistance - Patient 50 - 74%     Transfers Chair/bed transfer  Transfers assist     Chair/bed transfer assist level: Contact Guard/Touching assist     Locomotion Ambulation   Ambulation assist      Assist level: Minimal Assistance - Patient > 75% Assistive device: Walker-rolling Max distance: 25'   Walk 10 feet activity   Assist     Assist level: Minimal Assistance - Patient > 75% Assistive device: Walker-rolling   Walk 50 feet activity   Assist Walk 50 feet with 2 turns activity did not occur: Safety/medical concerns         Walk 150 feet activity   Assist Walk 150 feet activity did not occur: Safety/medical concerns  Walk 10 feet on uneven surface  activity   Assist Walk 10 feet on uneven surfaces activity did not occur: Safety/medical concerns         Wheelchair     Assist Will patient use wheelchair at discharge?: No   Wheelchair activity did not occur: N/A         Wheelchair 50 feet with 2 turns activity    Assist    Wheelchair 50 feet with 2 turns activity did not occur: N/A       Wheelchair 150 feet activity     Assist   Wheelchair 150 feet activity did not occur: N/A       Blood pressure (!) 133/47, pulse 73, temperature 98.1 F (36.7 C), resp. rate 17, height 5\' 4"  (1.626 m), weight 59.2 kg, SpO2 98 %.  Medical Problem List and Plan: 1.Left-sided weaknesssecondary to recurrent right MCA infarct in setting of right ICA occlusion and severe left ICA stenosis. --Patient is beginning CIR therapies today including PT, OT, and SLP   -ELOS 14 days  -left WHO at HS, AFO LLE per [facility] 2. Antithrombotics: -DVT/anticoagulation:Lovenox -antiplatelet therapy: Aspirin 325 mg daily and Plavix 75 mg daily 3. Pain Management:Tylenol as needed 4. Mood:Provide emotional support -antipsychotic agents: N/A 5. Neuropsych: This patientiscapable of making decisions on herown behalf. 6. Skin/Wound Care:Routine skin checks 7. Fluids/Electrolytes/Nutrition: encourage PO  -intake ok 8. Severe left internalcarotid stenosis. Follow-up outpatient vascular surgery Dr. Carlis Abbott to consider intervention 9. Hypertension. Norvasc 5 mg daily. Controlled at present 10. Diabetes mellitus. Hemoglobin A1c 10.3. SSI. Check blood sugars before meals and at bedtime.    -cbg's remain elevated  -adjusted lantus to 24u qam beginning 8/25   -added PM lantus 14u beginning 8/24  -pt is on heart healthy cm diet  -continue to follow for trend CBG (last 3)  Recent Labs    01/09/19 1708 01/09/19 2132 01/10/19 0616  GLUCAP 208* 221* 206*   11. Hyperlipidemia. Lipitor 12. Hypothyroidism. Continue Synthroid     LOS: 7 days A FACE TO FACE EVALUATION WAS PERFORMED  Meredith Staggers 01/10/2019, 10:28 AM

## 2019-01-11 ENCOUNTER — Inpatient Hospital Stay (HOSPITAL_COMMUNITY): Payer: Medicare Other | Admitting: Occupational Therapy

## 2019-01-11 ENCOUNTER — Encounter (HOSPITAL_COMMUNITY): Payer: Self-pay

## 2019-01-11 ENCOUNTER — Inpatient Hospital Stay (HOSPITAL_COMMUNITY): Payer: Medicare Other

## 2019-01-11 LAB — GLUCOSE, CAPILLARY
Glucose-Capillary: 183 mg/dL — ABNORMAL HIGH (ref 70–99)
Glucose-Capillary: 433 mg/dL — ABNORMAL HIGH (ref 70–99)
Glucose-Capillary: 182 mg/dL — ABNORMAL HIGH (ref 70–99)
Glucose-Capillary: 230 mg/dL — ABNORMAL HIGH (ref 70–99)

## 2019-01-11 NOTE — Progress Notes (Signed)
Occupational Therapy Weekly Progress Note  Patient Details  Name: Danielle [name] MRN: [id number] Date of Birth: 1932/01/14  Beginning of progress report period: January 04, 2019 End of progress report period: January 11, 2019  Today's Date: 01/11/2019  Session 1 OT Individual Time: 0930-1030 OT Individual Time Calculation (min): 60 min   Session 2 OT Individual Time: 1450-1541 OT Individual Time Calculation (min): 51 min    Patient has met 3 of 3 short term goals.  Pt has made progress towards OT goals this week. Pt is at an overall miN A level for transfers and BADL tasks. Pt is working on self-care retraining using hemi-techniques. Pt has had some muscle activation in shoulder/scapula. Continue current POC.  Patient continues to demonstrate the following deficits: muscle weakness, abnormal tone, unbalanced muscle activation and decreased coordination and decreased sitting balance, decreased standing balance, decreased postural control, hemiplegia and decreased balance strategies and therefore will continue to benefit from skilled OT intervention to enhance overall performance with BADL and Reduce care partner burden.  Patient progressing toward long term goals..  Continue plan of care.  OT Short Term Goals Week 1:  OT Short Term Goal 1 (Week 1): Pt will recall hemi-dressing techniques and thread R UE into shirt with min A OT Short Term Goal 1 - Progress (Week 1): Met OT Short Term Goal 2 (Week 1): Pt will complete 1 step of LB dressing task OT Short Term Goal 2 - Progress (Week 1): Met OT Short Term Goal 3 (Week 1): Pt will demonstrate self ROM to L UE with min questioning cues OT Short Term Goal 3 - Progress (Week 1): Met Week 2:  OT Short Term Goal 1 (Week 2): LTG=STG 2/2 ELOS  Skilled Therapeutic Interventions/Progress Updates:  Session 1    Pt greeted asleep in bed upon OT arrival. Pt agreeable to OT treatment session but needed increased time and min A to transition OOB  today. Pt ambulated to the sink with RW and assistance to place L hand in RW splint. Min A to ambulated to the sink with min A for RW management and verbal cues for body positioning within RW. Worked on standing balance while doffing clothing in standing. Pt needed min A to doff shirt off of R side. UB bathing at the sink sitting in wc with OT providing hand over hand A to integrate L UE for NMR. Educated on hemi dressing techniques with pt having difficulty understanding process- will continue to practice technique. Pt brought to the gym for continued L UE NMR with standing towel pushes on high-low table. OT facilitation for stretching and ROM as pectorals tight. Pt returned to room and completed stand-pivot back to recliner with CGA. Pt left with chair alarm on and needs met- spouse present.   Session 2 Pt greeted sitting in wc and agreeable to OT treatment session focused on functional use of L UE. Applied 1:1 NMES to CH1 supraspinatus and middle deltoid to help approximate shoulder joint, and Ch 2 wrist extensors.  Ratio 1:1 Rate 35 pps Waveform- Asymmetric Ramp 1.0 Pulse 300 CH1 Intensity- 18  Duration -  15  CH2 Intensity- 22 Duration -  15  OT facilitated elbow flex/ext and shoulder flex/ext with joint input to bring pt through full ROM. Pt reported need to go to the bathroom. Stand-pivot to commode with CGA and CGA for balance while pulling off pants. Pt voided bladder and completed peri-care with set-up A. Pt returned to recliner CGA stand pivot. Pt  left with chair alarm on and needs met.   Therapy Documentation Precautions:  Precautions Precautions: Fall Precaution Comments: L hemiparesis Restrictions Weight Bearing Restrictions: No Pain: Denies pain  Therapy/Group: Individual Therapy  Valma Cava 01/11/2019, 3:42 PM

## 2019-01-11 NOTE — Progress Notes (Signed)
[place] PHYSICAL MEDICINE & REHABILITATION PROGRESS NOTE   Subjective/Complaints: A little restless last night by report. No new issues.  ROS: Patient denies fever, rash, sore throat, blurred vision, nausea, vomiting, diarrhea, cough, shortness of breath or chest pain, joint or back pain, headache, or mood change.    Objective:   No results found. No results for input(s): WBC, HGB, HCT, PLT in the last 72 hours. No results for input(s): NA, K, CL, CO2, GLUCOSE, BUN, CREATININE, CALCIUM in the last 72 hours.  Intake/Output Summary (Last 24 hours) at 01/11/2019 0859 Last data filed at 01/11/2019 0808 Gross per 24 hour  Intake 900 ml  Output -  Net 900 ml     Physical Exam: Vital Signs Blood pressure 131/66, pulse 78, temperature 97.7 F (36.5 C), resp. rate 17, height 5\' 4"  (1.626 m), weight 59.2 kg, SpO2 100 %.  Constitutional: No distress . Vital signs reviewed. HEENT: EOMI, oral membranes moist Neck: supple Cardiovascular: RRR without murmur. No JVD    Respiratory: CTA Bilaterally without wheezes or rales. Normal effort    GI: BS +, non-tender, non-distended  Musculoskeletal:Normal range of motion. no shoulder pain General: No edema.  Neurological: She isalert. And oriented x 3. Left C7. RUE and RLE 4/5. LUE trace to  0/5--minimal change.  LLE remains 3 to 3+/5 prox to distal--no changes from baseline. Normal pain and LT in all 4's without change  Skin: Skin iswarmand dry. She isnot diaphoretic. Noerythema.  Psychiatric: pleasant   Assessment/Plan: 1. Functional deficits secondary to right MCA infarct which require 3+ hours per day of interdisciplinary therapy in a comprehensive inpatient rehab setting.  Physiatrist is providing close team supervision and 24 hour management of active medical problems listed below.  Physiatrist and rehab team continue to assess barriers to discharge/monitor patient progress toward functional and medical goals  Care  Tool:  Bathing    Body parts bathed by patient: Left arm, Abdomen, Chest, Front perineal area, Face   Body parts bathed by helper: Right arm, Buttocks, Right upper leg, Left lower leg, Right lower leg, Left upper leg     Bathing assist Assist Level: Maximal Assistance - Patient 24 - 49%     Upper Body Dressing/Undressing Upper body dressing   What is the patient wearing?: Pull over shirt    Upper body assist Assist Level: Moderate Assistance - Patient 50 - 74%    Lower Body Dressing/Undressing Lower body dressing      What is the patient wearing?: Underwear/pull up, Pants     Lower body assist Assist for lower body dressing: Maximal Assistance - Patient 25 - 49%     Toileting Toileting    Toileting assist Assist for toileting: Moderate Assistance - Patient 50 - 74%     Transfers Chair/bed transfer  Transfers assist     Chair/bed transfer assist level: Contact Guard/Touching assist     Locomotion Ambulation   Ambulation assist      Assist level: Minimal Assistance - Patient > 75% Assistive device: Walker-rolling Max distance: 25'   Walk 10 feet activity   Assist     Assist level: Minimal Assistance - Patient > 75% Assistive device: Walker-rolling   Walk 50 feet activity   Assist Walk 50 feet with 2 turns activity did not occur: Safety/medical concerns         Walk 150 feet activity   Assist Walk 150 feet activity did not occur: Safety/medical concerns         Walk  10 feet on uneven surface  activity   Assist Walk 10 feet on uneven surfaces activity did not occur: Safety/medical concerns         Wheelchair     Assist Will patient use wheelchair at discharge?: No   Wheelchair activity did not occur: N/A         Wheelchair 50 feet with 2 turns activity    Assist    Wheelchair 50 feet with 2 turns activity did not occur: N/A       Wheelchair 150 feet activity     Assist  Wheelchair 150 feet activity  did not occur: N/A       Blood pressure 131/66, pulse 78, temperature 97.7 F (36.5 C), resp. rate 17, height 5\' 4"  (1.626 m), weight 59.2 kg, SpO2 100 %.  Medical Problem List and Plan: 1.Left-sided weaknesssecondary to recurrent right MCA infarct in setting of right ICA occlusion and severe left ICA stenosis. -team conf today   -left WHO at HS, AFO LLE per [facility]  -continue supportive measures LUE 2. Antithrombotics: -DVT/anticoagulation:Lovenox -antiplatelet therapy: Aspirin 325 mg daily and Plavix 75 mg daily 3. Pain Management:Tylenol as needed 4. Mood:Provide emotional support -antipsychotic agents: N/A 5. Neuropsych: This patientiscapable of making decisions on herown behalf. 6. Skin/Wound Care:Routine skin checks 7. Fluids/Electrolytes/Nutrition: encourage PO  -intake ok 8. Severe left internalcarotid stenosis. Follow-up outpatient vascular surgery Dr. Carlis Abbott to consider intervention 9. Hypertension. Norvasc 5 mg daily. Controlled at present 10. Diabetes mellitus. Hemoglobin A1c 10.3. SSI. Check blood sugars before meals and at bedtime.    -adjusted lantus to 24u qam beginning 8/25   -added PM lantus 14u beginning 8/24  -pt is on heart healthy cm diet  -continue to follow for trend after above changes CBG (last 3)  Recent Labs    01/10/19 1708 01/10/19 2113 01/11/19 0617  GLUCAP 273* 249* 183*   11. Hyperlipidemia. Lipitor 12. Hypothyroidism. Continue Synthroid     LOS: 8 days A FACE TO FACE EVALUATION WAS PERFORMED  Meredith Staggers 01/11/2019, 8:59 AM

## 2019-01-11 NOTE — Progress Notes (Signed)
Physical Therapy Session Note  Patient Details  Name: Danielle [name] MRN: [id number] Date of Birth: 02/17/1932  Today's Date: 01/11/2019 PT Individual Time: 1305-1402 PT Individual Time Calculation (min): 57 min   Short Term Goals: Week 2:  PT Short Term Goal 1 (Week 2): =LTGs due to ELOS  Skilled Therapeutic Interventions/Progress Updates:   Presents in bed with husband at bedside and agreeable to therapy. Requires min assist for bed mobility to come to EOB with facilitation at trunk and cues for scooting to come to EOB. Total assist to don socks, shoes, and footup brace on L. Initial sit -> stand required strong min assist to facilitate anterior weightshift as pt with LOB posteriorly x 2. Cues for sequencing with RW for placement of hand on orthosis (requires max assist for this). Focused on sit <> stand first, then hand on the orthosis, and then when returning to sit, taking hand off first and then sitting back down to decrease risk of L hand getting stuck in walker or in bad position due to poor strength and awareness. Blocked practice sit <> stands without AD for NMR with CGA for facilitation and focus on motor activation in LLE during sit <> stand as pt tendency to use RLE more than L. Toe taps to 4" step with LLE for NMR for balance, coordination, and strengthening with min assist for balance. Ataxia noted. Pt unable to perform toe taps with RLE without UE support requiring R hand on back of chair x 10 reps for stance control training on LLE. Gait training without AD for NMR for postural control retraining, motor control and activation in LLE, with facilitation for weightshift to R to improve clearance and step length with L x 15' x 3 reps total. Gait x 15' x 2 with RW with hand orthosis with focus on gait pattern and weightshifting for improved LLE clearance and increasing step length on L and R. End of session discussed progress with pt's husband.   Therapy Documentation Precautions:   Precautions Precautions: Fall Precaution Comments: L hemiparesis Restrictions Weight Bearing Restrictions: No   Pain:  c/o pain in L thumb - repositioned and educated on attention to hand placement and positioning.    Therapy/Group: Individual Therapy  Canary Brim Ivory Broad, PT, DPT, CBIS  01/11/2019, 2:07 PM

## 2019-01-11 NOTE — Patient Care Conference (Signed)
Inpatient RehabilitationTeam Conference and Plan of Care Update Date: 01/11/2019   Time: 10:45 AM    Patient Name: Danielle [name]      Medical Record Number: [id number]  Date of Birth: [birth year] Sex: Female         Room/Bed: 4W12C/4W12C-01 Payor Info: Payor: [employer] MEDICARE / Plan: [facility] MEDICARE / Product Type: *No Product type* /    Admitting Diagnosis: 2. ABI Team  RT. MCA CVA; 17-19days   Admit Date/Time:  01/03/2019  5:35 PM Admission Comments: No comment available   Primary Diagnosis:  Right middle cerebral artery stroke [facility]) Principal Problem: Right middle cerebral artery stroke [facility])  Patient Active Problem List   Diagnosis Date Noted  . Right middle cerebral artery stroke ([place]) 01/03/2019  . Carotid stenosis, bilateral 12/30/2018  . Stroke ([place]) 12/24/2018  . Stroke-like symptoms 12/23/2018  . Generalized weakness 06/09/2018  . Hypothyroidism (acquired) 05/03/2018  . Hypertension 05/03/2018  . Uncontrolled type 2 diabetes mellitus with diabetic nephropathy, with long-term current use of insulin ([place])   . Back pain at L4-L5 level 06/14/2013  . Muscle weakness (generalized) 12/17/2012  . Pain in joint, shoulder region 12/17/2012  . Shoulder fracture 12/14/2012  . Proximal humerus fracture 11/29/2012  . FRACTURE, TOE, RIGHT 09/10/2009    Expected Discharge Date: Expected Discharge Date: 01/18/19  Team Members Present: Physician leading conference: Dr. Alger Simons Social Worker Present: Alfonse Alpers, LCSW Nurse Present: Dorthula Nettles, RN PT Present: Canary Brim, PT OT Present: Cherylynn Ridges, OT SLP Present: Weston Anna, SLP PPS Coordinator present : Gunnar Fusi, SLP     Current Status/Progress Goal Weekly Team Focus  Medical   right mca infarct. LUE most affected. WHO, AFO  improve functional mobility  bp and dm control. stroke education   Bowel/Bladder   Continent of B/B LBM 08/23  Remain contient of B/B with min assist  assess B/B  needs qshift and prn laxatives prn   Swallow/Nutrition/ Hydration             ADL's   Min A overall, some return in L shoulder  Supervision/min A  L NMR, self-care retraining, functional transfers, standing balance/endurance, pt/family education   Mobility   CGA-min assist overall, gait up to 25' and stair negotiation x3 steps  supervision-CGA household gait  LLE NMR, functional balance, fall risk education, caregiver education and d/c planning   Communication             Safety/Cognition/ Behavioral Observations            Pain   tylenol given for generalized discomfort with relief  Pain <=2/10  assess for pain qshift and prn medicate as ordered   Skin   Ecchymosis to abdomen  Remain free from infection and skin breakdown       Rehab Goals Patient on target to meet rehab goals: Yes Rehab Goals Revised: none *See Care Plan and progress notes for long and short-term goals.     Barriers to Discharge  Current Status/Progress Possible Resolutions Date Resolved   Physician    Medical stability        see medical progress notes      Nursing                  PT                    OT  SLP                SW                Discharge Planning/Teaching Needs:  Pt plans to return home with her husband and other family to assist as needed.  Family education prior to d/c.   Team Discussion:  Pt with MCA infarct, doing well medically.  Her diabetes is better controlled.  Her UE is still pretty weak and MD is monitoring.  Pt may require carotid surgery at some point.  Pt's continent of bowel and bladder and her blood sugars were good this morning.  Pt is min A overall with more tone in left arm.  She has supervision/min A goals.  Pt is CGA/min A short distance gait.  Pt is working on stairs.  PT is trying foot up brace and has S/CGA goals.      Revisions to Treatment Plan:  none    Continued Need for Acute Rehabilitation Level of Care: The patient requires daily  medical management by a physician with specialized training in physical medicine and rehabilitation for the following conditions: Daily direction of a multidisciplinary physical rehabilitation program to ensure safe treatment while eliciting the highest outcome that is of practical value to the patient.: Yes Daily medical management of patient stability for increased activity during participation in an intensive rehabilitation regime.: Yes Daily analysis of laboratory values and/or radiology reports with any subsequent need for medication adjustment of medical intervention for : Neurological problems;Blood pressure problems;Diabetes problems   I attest that I was present, lead the team conference, and concur with the assessment and plan of the team.Team conference was held via web/ teleconference due to [place] - 19.   Sanyia Dini, Silvestre Mesi 01/11/2019, 10:19 PM

## 2019-01-11 NOTE — Progress Notes (Signed)
Social Work Patient ID: Danielle [name], female   DOB: 05-Apr-1932, 85 y.o.   MRN: [id number]   CSW met with pt and her husband to update them on team conference discussion and targeted d/c date of 01-18-19. Both were very excited of d/c date and husband will be there with pt.  Pt's dtr will provide respite for husband.  Both dtr and husband to come in for family ed prior to pt's d/c and CSW will arrange this.  CSW will also arrange HH therapies and order DME.

## 2019-01-11 NOTE — Progress Notes (Addendum)
Physical Therapy Session Note  Patient Details  Name: Danielle [name] MRN: [id number] Date of Birth: 1931/06/11  Today's Date: 01/11/2019 PT Individual Time: 0801-0901 PT Individual Time Calculation (min): 60 min   Short Term Goals: Week 1:  PT Short Term Goal 1 (Week 1): Pt will ambulate 25' w/ min assist PT Short Term Goal 1 - Progress (Week 1): Met PT Short Term Goal 2 (Week 1): Pt will perform dynamic standing balance tasks w/ min assist PT Short Term Goal 2 - Progress (Week 1): Met PT Short Term Goal 3 (Week 1): Pt will negotiate 2 steps w/ R rail w/ min assist PT Short Term Goal 3 - Progress (Week 1): Met Week 2:  PT Short Term Goal 1 (Week 2): =LTGs due to ELOS Week 3:     Skilled Therapeutic Interventions/Progress Updates:      Therapy Documentation Precautions:  Precautions Precautions: Fall Precaution Comments: L hemiparesis Restrictions Weight Bearing Restrictions: No AM Session:  Pain:  Pt reports 4/10 pain Lthumb which she states "comes and goes" but is sensitive to pressure.  Treatment to tolerance.   Neuromuscular ReEd:  Standing balance/wt shifting activity to promote wt shifting/facilitation of LLE in stance.  Worked on standing, retrieving beanbags from L mat w/LUE and tossing them into bucket also to pt L w/emphasis on recovering midline/static balance between efforts.  Reaching to L to retrieve clips from below waist L then w/L/R reaching above shoulder height to clip them to basketball net placed to L, again regaining balance and midline between efforts.    Gait:  51f w/RW w/L handsplint and cga plus cues to increase clearance LLE thru swing which decreasess w/fatigue.  Stairs:  Pt instructed w/technique for ascending/descending w/single rail as at home.  Pt performed 4steps w/single rail w/min assist and verbal cueing for safety/sequencing. Cues required for adequate/safe placement of L foot on stair. Repeated stairs as above w/decreased cuing  required w/second pass.    wc to commode via squat pivot w/mod assist of 1, max assist to lower pants in standing using rails for balance.  Pt left on commode w/nurse tech w/pt. tending to needs.  Assessment:  Pt w/mild L inattention requiring verbal cues/reminders for safe foot placement on stairs.  Needs continued training w/this.  High risk of falls due to balance deficits.   Therapy/Group: Individual Therapy  BCallie Fielding PT    01/11/2019, 10:48 AM

## 2019-01-11 NOTE — Progress Notes (Signed)
Pt restless this shift and unable to sleep. Requesting to sit on the edge of the bed several times each hour. Pt was requesting hot coffee and was educated as to why caffeine is limited in the evening. Pt is confused to time easily reorients. Relaxation techniques reviewed and suggestions to improve sleep reviewed with patient.

## 2019-01-12 ENCOUNTER — Inpatient Hospital Stay (HOSPITAL_COMMUNITY): Payer: Medicare Other

## 2019-01-12 LAB — GLUCOSE, CAPILLARY
Glucose-Capillary: 169 mg/dL — ABNORMAL HIGH (ref 70–99)
Glucose-Capillary: 228 mg/dL — ABNORMAL HIGH (ref 70–99)
Glucose-Capillary: 237 mg/dL — ABNORMAL HIGH (ref 70–99)
Glucose-Capillary: 241 mg/dL — ABNORMAL HIGH (ref 70–99)

## 2019-01-12 MED ORDER — INSULIN GLARGINE 100 UNIT/ML ~~LOC~~ SOLN
16.0000 [IU] | Freq: Every day | SUBCUTANEOUS | Status: DC
  Administered 2019-01-12 – 2019-01-14 (×3): 16 [IU] via SUBCUTANEOUS
  Filled 2019-01-12 (×4): qty 0.16

## 2019-01-12 MED ORDER — INSULIN GLARGINE 100 UNIT/ML ~~LOC~~ SOLN
26.0000 [IU] | Freq: Every day | SUBCUTANEOUS | Status: DC
  Administered 2019-01-13 – 2019-01-18 (×6): 26 [IU] via SUBCUTANEOUS
  Filled 2019-01-12 (×7): qty 0.26

## 2019-01-12 NOTE — Progress Notes (Signed)
Occupational Therapy Session Note  Patient Details  Name: CHAKAYLA SASNETT MRN: [id number] Date of Birth: 07-Sep-1931  Today's Date: 01/12/2019 OT Individual Time: 1100-1200 OT Individual Time Calculation (min): 60 min    Short Term Goals: Week 2:  OT Short Term Goal 1 (Week 2): LTG=STG 2/2 ELOS  Skilled Therapeutic Interventions/Progress Updates:    Pt resting in w/c upon arrival with husband present.  OT intervention with focus on functional transfers (min A for stand pivot), sitting balance, LUE NMR, LUE NMES, and LUE forced use for table tasks. Pt with increased shoulder/scapula activattion noted with increased synergistic tone noted.  Pt engaged in stool work with focus on muscle isolation for quality movement.    1:1 NMES applied to wrist extensors Ratio 1:3 Rate 35 pps Waveform- Asymmetric Ramp 1.0 Pulse 300 Intensity- 20 Duration -   10  Limited response and unable to replicate movement  No adverse reactions after treatment and is skin intact.   Pt returned to room and remained in w/c with all needs within reach and belt alarm activated.  Husband present.    Therapy Documentation Precautions:  Precautions Precautions: Fall Precaution Comments: L hemiparesis Restrictions Weight Bearing Restrictions: No  Pain:  Pt c/o L thumb tenderness (no bruising or swelling noted); MD aware   Other Treatments: Treatments Neuromuscular Facilitation: Upper Extremity;Left;Activity to increase coordination;Activity to increase motor control;Activity to increase timing and sequencing;Activity to increase sustained activation Weight Bearing Technique Weight Bearing Technique: Yes   Therapy/Group: Individual Therapy  Leroy Libman 01/12/2019, 12:32 PM

## 2019-01-12 NOTE — Progress Notes (Signed)
[facility] PHYSICAL MEDICINE & REHABILITATION PROGRESS NOTE   Subjective/Complaints: No big changes. Frustrated by left arm. Notices some pain in left index finger over the last few days  ROS: Patient denies fever, rash, sore throat, blurred vision, nausea, vomiting, diarrhea, cough, shortness of breath or chest pain,   back pain, headache, or mood change.     Objective:   No results found. No results for input(s): WBC, HGB, HCT, PLT in the last 72 hours. No results for input(s): NA, K, CL, CO2, GLUCOSE, BUN, CREATININE, CALCIUM in the last 72 hours.  Intake/Output Summary (Last 24 hours) at 01/12/2019 0902 Last data filed at 01/12/2019 0729 Gross per 24 hour  Intake 1080 ml  Output -  Net 1080 ml     Physical Exam: Vital Signs Blood pressure (!) 128/54, pulse 72, temperature 98.5 F (36.9 C), resp. rate 17, height 5\' 4"  (1.626 m), weight 59.2 kg, SpO2 100 %.  Constitutional: No distress . Vital signs reviewed. HEENT: EOMI, oral membranes moist Neck: supple Cardiovascular: RRR without murmur. No JVD    Respiratory: CTA Bilaterally without wheezes or rales. Normal effort    GI: BS +, non-tender, non-distended  Musculoskeletal:Normal range of motion. no shoulder pain. Mild TTP left index finger, no discoloration or swelling General: No edema.  Neurological: She isalert. And oriented x 3. Left C7. RUE and RLE 4/5. LUE trace to  0/5--no change today.  LLE remains 3 to 3+/5 prox to distal--no changes from baseline. Normal pain and LT in all 4's without change  Skin: Skin iswarmand dry. She isnot diaphoretic. Noerythema.  Psychiatric: pleasant   Assessment/Plan: 1. Functional deficits secondary to right MCA infarct which require 3+ hours per day of interdisciplinary therapy in a comprehensive inpatient rehab setting.  Physiatrist is providing close team supervision and 24 hour management of active medical problems listed below.  Physiatrist and rehab team continue to  assess barriers to discharge/monitor patient progress toward functional and medical goals  Care Tool:  Bathing    Body parts bathed by patient: Left arm, Abdomen, Chest, Front perineal area, Face   Body parts bathed by helper: Right arm, Buttocks, Right upper leg, Left lower leg, Right lower leg, Left upper leg     Bathing assist Assist Level: Maximal Assistance - Patient 24 - 49%     Upper Body Dressing/Undressing Upper body dressing   What is the patient wearing?: Pull over shirt    Upper body assist Assist Level: Moderate Assistance - Patient 50 - 74%    Lower Body Dressing/Undressing Lower body dressing      What is the patient wearing?: Underwear/pull up, Pants     Lower body assist Assist for lower body dressing: Maximal Assistance - Patient 25 - 49%     Toileting Toileting    Toileting assist Assist for toileting: Moderate Assistance - Patient 50 - 74%     Transfers Chair/bed transfer  Transfers assist     Chair/bed transfer assist level: Minimal Assistance - Patient > 75%     Locomotion Ambulation   Ambulation assist      Assist level: Minimal Assistance - Patient > 75% Assistive device: Hand held assist Max distance: 15'   Walk 10 feet activity   Assist     Assist level: Minimal Assistance - Patient > 75% Assistive device: Walker-rolling   Walk 50 feet activity   Assist Walk 50 feet with 2 turns activity did not occur: Safety/medical concerns  Walk 150 feet activity   Assist Walk 150 feet activity did not occur: Safety/medical concerns         Walk 10 feet on uneven surface  activity   Assist Walk 10 feet on uneven surfaces activity did not occur: Safety/medical concerns         Wheelchair     Assist Will patient use wheelchair at discharge?: No   Wheelchair activity did not occur: N/A         Wheelchair 50 feet with 2 turns activity    Assist    Wheelchair 50 feet with 2 turns activity  did not occur: N/A       Wheelchair 150 feet activity     Assist  Wheelchair 150 feet activity did not occur: N/A       Blood pressure (!) 128/54, pulse 72, temperature 98.5 F (36.9 C), resp. rate 17, height 5\' 4"  (1.626 m), weight 59.2 kg, SpO2 100 %.  Medical Problem List and Plan: 1.Left-sided weaknesssecondary to recurrent right MCA infarct in setting of right ICA occlusion and severe left ICA stenosis.    -left WHO at HS, AFO LLE per [facility]  -continue supportive measures LUE 2. Antithrombotics: -DVT/anticoagulation:Lovenox -antiplatelet therapy: Aspirin 325 mg daily and Plavix 75 mg daily 3. Pain Management:Tylenol as needed  -maybe some early neuropathic pain Left hand  -encouraged ongoing massage and desensitization as possible 4. Mood:Provide emotional support -antipsychotic agents: N/A 5. Neuropsych: This patientiscapable of making decisions on herown behalf. 6. Skin/Wound Care:Routine skin checks 7. Fluids/Electrolytes/Nutrition: encourage PO  -intake ok 8. Severe left internalcarotid stenosis. Follow-up outpatient vascular surgery Dr. Carlis Abbott to consider intervention 9. Hypertension. Norvasc 5 mg daily. Controlled at present 10. Diabetes mellitus. Hemoglobin A1c 10.3. SSI. Check blood sugars before meals and at bedtime.    -adjusted lantus to 24u qam beginning 8/25 --->26  -added PM lantus 14u beginning 8/24--->16  -pt is on heart healthy cm diet  -showing improvement---continue to titrate CBG (last 3)  Recent Labs    01/11/19 1659 01/11/19 2126 01/12/19 0631  GLUCAP 182* 230* 169*   11. Hyperlipidemia. Lipitor 12. Hypothyroidism. Continue Synthroid     LOS: 9 days A FACE TO FACE EVALUATION WAS PERFORMED  Meredith Staggers 01/12/2019, 9:02 AM

## 2019-01-12 NOTE — Progress Notes (Addendum)
Physical Therapy Session Note  Patient Details  Name: Danielle [name] MRN: [id number] Date of Birth: 01/12/1932  Today's Date: 01/12/2019 PT Individual Time: 0800-0917; [id number] PT Individual Time Calculation (min): 77 min , 60 min  Short Term Goals:  Week 2:  PT Short Term Goal 1 (Week 2): =LTGs due to ELOS  Skilled Therapeutic Interventions/Progress Updates:   tx 1:  Pt sitting up in w/c.  She reported tenderness, edema  L thumb.   Toilet transfer with min assist, max cues.  Mod assist for clothing mgt.  Continent of urine.  Pt doffed bil socks.  PT donned TEDS, L shoe with Foot UP AFO  and assisted with R shoe, with pt sitting in wc.  neuromuscular re-education via NuStep level 3 x 3 minutes for alternating reciprocal movement x 3 extremites.  Pt unable to tolerate use of L hand orthosis due to thumb tenderness.  Therapeutic activity seated EOM; reciprocal scooting forward/backward with up to mod assist for activation of R/L pelvic muscles for movement.    Sit> stand with min assist.  Gait training with RW with L hand splint, L Foot UP brace, x 90' with min-.mod assist,  including 3 cone obstacle course.  Pt has very poor awareness of L hemibody, continually kicking leg of RW iwht L foot.  Max cues for upright sttance, forward gaze and L attention.  At end of session, pt seated in w/c with needs at hand and seat belt alarm set.  tx 2:  Husband present.  Pt reported that L thumb is still very tender to touch.  Husband reported that the 2 serious falls that pt had, PTA were both when she was ascending the steps to enter house, and she fell backwards x 1, and fell R onto shoulder due to L foot catching step x 1.   neuromuscular re-education via sustained stretch bil heel cords and hamstrings , and balance challenge, in standing with forefeet on wedge and bil UE support on RWx 5 minutes, and in sitting stretched R/L heel cords and hamstrings with active knee ext and ankle DF,  x 30 seconds x 3 each.  Pt had 5 LOB backwards while conversing with PT with absent balance strategies, requiring assistance to regain balance.  Seated EOM, L lateral leans onto elbow, avoiding hand, x 10 >< midline.   Pt counted backwards to time herself for stretching accurately.  Gait training on flat tile x 40' x 2 with RW, L hand splint and L Foot UP AFO. Up/down (4) 6" high steps R rail, min assist .  Pt remembered sequencing without cues.  During ascending and descending, pt's L toes or heel caught q step requiring extra time.  At end of session, pt seated in wc with needs at ha nd and seat belt alarm set.  Husband present.  Pt and husband would benefit from detained instruction and hand-out for sitting/and or standing bil heel cord and hamstrings stretches.       Therapy Documentation Precautions:  Precautions Precautions: Fall Precaution Comments: L hemiparesis Restrictions Weight Bearing Restrictions: No      Therapy/Group: Individual Therapy  Anisia Leija 01/12/2019, 9:55 AM

## 2019-01-13 ENCOUNTER — Inpatient Hospital Stay (HOSPITAL_COMMUNITY): Payer: Medicare Other | Admitting: Occupational Therapy

## 2019-01-13 ENCOUNTER — Inpatient Hospital Stay (HOSPITAL_COMMUNITY): Payer: Medicare Other | Admitting: Physical Therapy

## 2019-01-13 LAB — GLUCOSE, CAPILLARY
Glucose-Capillary: 102 mg/dL — ABNORMAL HIGH (ref 70–99)
Glucose-Capillary: 128 mg/dL — ABNORMAL HIGH (ref 70–99)
Glucose-Capillary: 163 mg/dL — ABNORMAL HIGH (ref 70–99)
Glucose-Capillary: 192 mg/dL — ABNORMAL HIGH (ref 70–99)

## 2019-01-13 MED ORDER — POLYETHYLENE GLYCOL 3350 17 G PO PACK
17.0000 g | PACK | Freq: Every day | ORAL | Status: DC | PRN
  Administered 2019-01-13 – 2019-01-19 (×4): 17 g via ORAL
  Filled 2019-01-13 (×5): qty 1

## 2019-01-13 MED ORDER — SENNOSIDES-DOCUSATE SODIUM 8.6-50 MG PO TABS
1.0000 | ORAL_TABLET | Freq: Every day | ORAL | Status: DC
  Administered 2019-01-13 – 2019-01-19 (×8): 1 via ORAL
  Filled 2019-01-13 (×7): qty 1

## 2019-01-13 NOTE — Plan of Care (Signed)
  Problem: RH BLADDER ELIMINATION Goal: RH STG MANAGE BLADDER WITH ASSISTANCE Description: STG Manage Bladder With Mod I Assistance Outcome: Progressing   Problem: RH SAFETY Goal: RH STG ADHERE TO SAFETY PRECAUTIONS W/ASSISTANCE/DEVICE Description: STG Adhere to Safety Precautions With Mod I Assistance/Device. Outcome: Progressing   Problem: Consults Goal: RH STROKE PATIENT EDUCATION Description: Patient/family will be able to verbalize prevention of stroke, medications, and self care after discharge with cues/handouts Outcome: Progressing   Problem: RH BOWEL ELIMINATION Goal: RH STG MANAGE BOWEL WITH ASSISTANCE Description: STG Manage Bowel with Mod I Assistance. Outcome: Not Progressing Goal: RH STG MANAGE BOWEL W/MEDICATION W/ASSISTANCE Description: STG Manage Bowel with Medication with Mod I Assistance. Outcome: Not Progressing  RN explained risks of constipation with pt and offered different meds to help assist with BM and pt refused x 2. Will try and offer pt again at later time.  Erie Noe, RN

## 2019-01-13 NOTE — Progress Notes (Signed)
Occupational Therapy Session Note  Patient Details  Name: Danielle [name] MRN: [id number] Date of Birth: [birth year]  Today's Date: 01/13/2019 OT Individual Time: 1045-1200 OT Individual Time Calculation (min): 75 min   Short Term Goals: Week 2:  OT Short Term Goal 1 (Week 2): LTG=STG 2/2 ELOS  Skilled Therapeutic Interventions/Progress Updates:     Pt greeted seated in wc and agreeable to OT treatment session. Pt brought down to therapy gym in wc. Applied 1:1 NMES to CH1 supraspinatus and middle deltoid to help approximate shoulder joint, and Ch 2 wrist extensors.  Ratio 1:1 Rate 35 pps Waveform- Asymmetric Ramp 1.0 Pulse 300 CH1 Intensity- 20  Duration -  15  CH2 Intensity- 23 Duration -  15  OT provided pt with elbow pad for protection. Stand-pivot to therapy mat with CGA. Pt brought into gravity eliminated sidelying position for L UE NMR. OT provided joint input to elbow/wrist to bring pt through full ROM. Pt with increased flexor tone requiring gentle massage and stretching to decrease. OT placed L hand in hand skate while in sidleying and tried to elicit shoulder and elbow activation without gravity. Pt with trace scapula and shoulder activation with OT providing guided A to initiate movement.  Pt brought back to sitting and was able to activate flexor synergy pattern in L elbow and hand. Pt pivoted back to wc with CGA and returned to room. Pt left seated in wc with safety belt on and call bell in reach-spouse present.   Therapy Documentation Precautions:  Precautions Precautions: Fall Precaution Comments: L hemiparesis Restrictions Weight Bearing Restrictions: No Pain: Pain Assessment Pain Scale: 0-10 Pain Score: 7  Pain Type: Acute pain Pain Location: Finger (Comment which one) Thumb Pain Orientation: Left Pain Descriptors / Indicators: Sore Pain Onset: On-going Pain Intervention(s): Other (Comment)(coban wrap)   Therapy/Group: Individual  Therapy  Valma Cava 01/13/2019, 11:15 AM

## 2019-01-13 NOTE — Progress Notes (Signed)
Pt still has not had BM, prune juice, miralax, and senna given. Pt stated she has had gas but still waiting. Will try to use bathroom later on this evening.  Erie Noe, RN

## 2019-01-13 NOTE — Progress Notes (Signed)
Occupational Therapy Session Note  Patient Details  Name: Danielle [name] MRN: [id number] Date of Birth: 05/22/1931  Today's Date: 01/13/2019 OT Individual Time: 0915-1000 OT Individual Time Calculation (min): 45 min    Short Term Goals: Week 2:  OT Short Term Goal 1 (Week 2): LTG=STG 2/2 ELOS  Skilled Therapeutic Interventions/Progress Updates:  Pt received in w/c ready for therapy.    ADL Retraining:  Pt declined shower as she gets too cold, but did want to sponge bathe.  Pt had a button up sweater and shirt which required mod A to doff and don.  She was able to bathe UB with min A and then stood with CGA for balance as she washed her bottom. Was able to reach to feet to wash.  Able to don pull up and pants over feet and then mod A over hips.  A pt with TEDS, shoes, toe up brace.    Balance: CGA with sit to stand and standing at sink  Neuromuscular Re-Education: PROM to LUE, c/o of thumb pain. A thumb spica splint would be very helpful during the day.  In the meantime, wrapped thumb joint with coban for support.  She stated her resting hand splint was painful to her thumb to put on, so adjusted it to have a more narrow web space.   Pt resting in w/c with belt alarm on, lap tray on for LUE, and all needs met.   Therapy Documentation Precautions:  Precautions Precautions: Fall Precaution Comments: L hemiparesis Restrictions Weight Bearing Restrictions: No      Pain: Pain Assessment Pain Scale: 0-10 Pain Score: 7  Pain Type: Acute pain Pain Location: Finger (Comment which one) Pain Orientation: Left Pain Descriptors / Indicators: Sore Pain Onset: On-going Pain Intervention(s): Other (Comment)(coban wrap)    Therapy/Group: Individual Therapy  [place] 01/13/2019, 10:06 AM

## 2019-01-13 NOTE — Progress Notes (Signed)
Physical Therapy Session Note  Patient Details  Name: Danielle [name] MRN: [id number] Date of Birth: 1931/10/16  Today's Date: 01/13/2019 PT Individual Time: [id number] PT Individual Time Calculation (min): 60 min   Short Term Goals: Week 2:  PT Short Term Goal 1 (Week 2): =LTGs due to ELOS  Skilled Therapeutic Interventions/Progress Updates:    Pt received sitting in w/c and agreeable to therapy session.  Transported to/from gym in w/c for energy conservation. Sit<>stands using RW with CGA/min assist for steadying and eccentric control - mod cuing for L hand placement on/off orthotic using R UE to assist with its placement and strap, but continues to require min assist for forearm pronation to improve hand alignment.  Ambulated 17ft x 6 using RW with min assist progressed to CGA for balance:  1st walk: automatically pt demonstrated L veering requiring cuing for correction, possibly due to poor L UE AD management from strength impairments also demonstrated poor L LE foot clearance during swing phase often having toe drag and not bringing foot through fully causing minor LOB requiring intermittent min assist for balance 2nd and 3rd walk: educated pt on exaggerated L LE swing phase mechanics with excessive hip/knee flexion followed by knee extension and heel strike - pt demonstrates sufficient strength to perform proper swing phase mechanics but rather has a lack of motor timing/sequencing to activate the muscles and bring L LE through during swing  4th - 6th walk: worked on decreasing exaggeration of movement to achieve a more normal Magazine features editor with pt demonstrating improvement towards a more normal gait mechanic but is inconsistent either converting back to exaggerated movement or toe drag and no foot clearance  Ascended/descneded 4 (6" height) steps using RUE support on L handrail to replicate home environment with CGA/min assist for steadying/balance and pt demonstrating proper LE sequencing  for side step-to pattern with min cuing. Transported back to room in w/c and pt left sitting in w/c with needs in reach and seat belt alarm on.  Therapy Documentation Precautions:  Precautions Precautions: Fall Precaution Comments: L hemiparesis Restrictions Weight Bearing Restrictions: No  Pain:   Reports L thumb pain if not positioned correctly on RW orthotic - ensured attention to placement on/off RW during transfers for pain management.   Therapy/Group: Individual Therapy  Tawana Scale, PT, DPT 01/13/2019, 3:33 PM

## 2019-01-13 NOTE — Progress Notes (Signed)
Occupational Therapy Session Note  Patient Details  Name: Danielle [name] MRN: [id number] Date of Birth: 1931/11/07  Today's Date: 01/13/2019 OT Individual Time: 1335-1410 OT Individual Time Calculation (min): 35 min    Short Term Goals: Week 2:  OT Short Term Goal 1 (Week 2): LTG=STG 2/2 ELOS  Skilled Therapeutic Interventions/Progress Updates:    Pt completed functional transfer from wheelchair to the therapy mat with min assist.  She then worked on LUE neuromuscular re-education while in sitting position.  Began with work on maintaining upright anterior pelvic tilt while therapist completed left scapula mobility for adduction and downward rotation.  Pt was able to assist slightly with active movement as well.  Noted a decrease in flexor tone of the elbow, shoulder, and digits after completion of mobilizations and working on more upright posture.  Next, worked on having pt attempt small movements of external rotation and elbow extension with the LUE while hand was placed on a therapy ball.  Noted occasional trace activation but not consistent.  Increased activation noted for internal rotation and supination.  Finished session with return to the wheelchair and transfer back to the room with call button and phone in reach and safety belt in place.    Therapy Documentation Precautions:  Precautions Precautions: Fall Precaution Comments: L hemiparesis Restrictions Weight Bearing Restrictions: No  Pain: Pain Assessment Pain Scale: Faces Pain Score: 0-No pain Pain Type: Acute pain Pain Location: Finger (Comment which one)(thumb IP joint) Pain Orientation: Left Pain Descriptors / Indicators: Discomfort Pain Onset: With Activity Pain Intervention(s): Emotional support;Repositioned  Therapy/Group: Individual Therapy  Tahmid Stonehocker OTR/L 01/13/2019, 4:14 PM

## 2019-01-13 NOTE — Progress Notes (Signed)
[facility] PHYSICAL MEDICINE & REHABILITATION PROGRESS NOTE   Subjective/Complaints: Had a reasonable night.  A little constipated.   ROS: Patient denies fever, rash, sore throat, blurred vision, nausea, vomiting, diarrhea, cough, shortness of breath or chest pain,  back pain, headache, or mood change.   Objective:   No results found. No results for input(s): WBC, HGB, HCT, PLT in the last 72 hours. No results for input(s): NA, K, CL, CO2, GLUCOSE, BUN, CREATININE, CALCIUM in the last 72 hours.  Intake/Output Summary (Last 24 hours) at 01/13/2019 1031 Last data filed at 01/13/2019 0900 Gross per 24 hour  Intake 960 ml  Output -  Net 960 ml     Physical Exam: Vital Signs Blood pressure (!) 149/67, pulse 73, temperature 98.5 F (36.9 C), temperature source Oral, resp. rate 17, height 5\' 4"  (1.626 m), weight 59.2 kg, SpO2 99 %.  Constitutional: No distress . Vital signs reviewed. HEENT: EOMI, oral membranes moist Neck: supple Cardiovascular: RRR without murmur. No JVD    Respiratory: CTA Bilaterally without wheezes or rales. Normal effort    GI: BS +, non-tender, non-distended  Musculoskeletal:Normal range of motion. no shoulder pain. Mild TTP left index finger and thumb, no discoloration or swelling General: No edema.  Neurological: She isalert. And oriented x 3. Left C7. RUE and RLE 4/5. LUE trace to  0/5--no change today.  LLE remains 3 to 3+/5 prox to distal--no changes from baseline. Normal pain and LT in all 4's--no change  Skin: Skin iswarmand dry. She isnot diaphoretic. Noerythema.  Psychiatric: pleasant   Assessment/Plan: 1. Functional deficits secondary to right MCA infarct which require 3+ hours per day of interdisciplinary therapy in a comprehensive inpatient rehab setting.  Physiatrist is providing close team supervision and 24 hour management of active medical problems listed below.  Physiatrist and rehab team continue to assess barriers to  discharge/monitor patient progress toward functional and medical goals  Care Tool:  Bathing    Body parts bathed by patient: Left arm, Abdomen, Chest, Front perineal area, Face   Body parts bathed by helper: Right arm, Buttocks, Right upper leg, Left lower leg, Right lower leg, Left upper leg     Bathing assist Assist Level: Maximal Assistance - Patient 24 - 49%     Upper Body Dressing/Undressing Upper body dressing   What is the patient wearing?: Pull over shirt    Upper body assist Assist Level: Moderate Assistance - Patient 50 - 74%    Lower Body Dressing/Undressing Lower body dressing      What is the patient wearing?: Underwear/pull up, Pants     Lower body assist Assist for lower body dressing: Maximal Assistance - Patient 25 - 49%     Toileting Toileting    Toileting assist Assist for toileting: Moderate Assistance - Patient 50 - 74%     Transfers Chair/bed transfer  Transfers assist     Chair/bed transfer assist level: Minimal Assistance - Patient > 75%     Locomotion Ambulation   Ambulation assist      Assist level: Moderate Assistance - Patient 50 - 74% Assistive device: Orthosis Max distance: 90   Walk 10 feet activity   Assist     Assist level: Moderate Assistance - Patient - 50 - 74% Assistive device: Walker-rolling, Orthosis   Walk 50 feet activity   Assist Walk 50 feet with 2 turns activity did not occur: Safety/medical concerns  Assist level: Moderate Assistance - Patient - 50 - 74% Assistive device: Orthosis,  Walker-rolling    Walk 150 feet activity   Assist Walk 150 feet activity did not occur: Safety/medical concerns  Assist level: Moderate Assistance - Patient - 50 - 74% Assistive device: Walker-rolling    Walk 10 feet on uneven surface  activity   Assist Walk 10 feet on uneven surfaces activity did not occur: Safety/medical concerns         Wheelchair     Assist Will patient use wheelchair at  discharge?: No   Wheelchair activity did not occur: N/A         Wheelchair 50 feet with 2 turns activity    Assist    Wheelchair 50 feet with 2 turns activity did not occur: N/A       Wheelchair 150 feet activity     Assist  Wheelchair 150 feet activity did not occur: N/A       Blood pressure (!) 149/67, pulse 73, temperature 98.5 F (36.9 C), temperature source Oral, resp. rate 17, height 5\' 4"  (1.626 m), weight 59.2 kg, SpO2 99 %.  Medical Problem List and Plan: 1.Left-sided weaknesssecondary to recurrent right MCA infarct in setting of right ICA occlusion and severe left ICA stenosis.    -left WHO at HS, AFO LLE per [facility]  -continue supportive measures LUE 2. Antithrombotics: -DVT/anticoagulation:Lovenox -antiplatelet therapy: Aspirin 325 mg daily and Plavix 75 mg daily 3. Pain Management:Tylenol as needed  -maybe some early neuropathic pain Left hand  -encouraged ongoing massage and desensitization as possible 4. Mood:Provide emotional support -antipsychotic agents: N/A 5. Neuropsych: This patientiscapable of making decisions on herown behalf. 6. Skin/Wound Care:Routine skin checks 7. Fluids/Electrolytes/Nutrition: encourage PO  -intake ok 8. Severe left internalcarotid stenosis. Follow-up outpatient vascular surgery Dr. Carlis Abbott to consider intervention 9. Hypertension. Norvasc 5 mg daily. Controlled at present 10. Diabetes mellitus. Hemoglobin A1c 10.3. SSI. Check blood sugars before meals and at bedtime.    -adjusted lantus to  qam  --->26u  -added PM lantus  --->16u   -pt is on heart healthy cm diet  -inconsistent control. Continue to titrate as needed CBG (last 3)  Recent Labs    01/12/19 1656 01/12/19 2126 01/13/19 0626  GLUCAP 237* 241* 102*   11. Hyperlipidemia. Lipitor 12. Hypothyroidism. Continue Synthroid 13. Constipation: Needs a bm    LOS: 10 days A FACE TO FACE  EVALUATION WAS PERFORMED  Danielle [name] 01/13/2019, 10:31 AM

## 2019-01-13 NOTE — Progress Notes (Signed)
Pt LBM noted 8/23. Pt states been few days. RN offered sorbitol, pt refused. Stated has taken before but didn't seem to work. RN asked if pt wanted to try Miralax or suppository. Pt refused any medication assistance. Stated she would try and go today. Will reassess at later time.  Erie Noe, RN

## 2019-01-14 ENCOUNTER — Ambulatory Visit (HOSPITAL_COMMUNITY): Payer: Medicare Other

## 2019-01-14 ENCOUNTER — Encounter (HOSPITAL_COMMUNITY): Payer: Medicare Other | Admitting: Occupational Therapy

## 2019-01-14 ENCOUNTER — Inpatient Hospital Stay (HOSPITAL_COMMUNITY): Payer: Medicare Other | Admitting: Occupational Therapy

## 2019-01-14 LAB — GLUCOSE, CAPILLARY
Glucose-Capillary: 62 mg/dL — ABNORMAL LOW (ref 70–99)
Glucose-Capillary: 66 mg/dL — ABNORMAL LOW (ref 70–99)
Glucose-Capillary: 217 mg/dL — ABNORMAL HIGH (ref 70–99)
Glucose-Capillary: 164 mg/dL — ABNORMAL HIGH (ref 70–99)
Glucose-Capillary: 189 mg/dL — ABNORMAL HIGH (ref 70–99)
Glucose-Capillary: 110 mg/dL — ABNORMAL HIGH (ref 70–99)

## 2019-01-14 MED ORDER — DICLOFENAC SODIUM 1 % TD GEL
2.0000 g | Freq: Three times a day (TID) | TRANSDERMAL | Status: DC
  Administered 2019-01-14 – 2019-01-17 (×9): 2 g via TOPICAL
  Filled 2019-01-14: qty 100

## 2019-01-14 NOTE — Progress Notes (Signed)
[facility] PHYSICAL MEDICINE & REHABILITATION PROGRESS NOTE   Subjective/Complaints: Left thumb still tender Had low cbg this am.  ROS: Patient denies fever, rash, sore throat, blurred vision, nausea, vomiting, diarrhea, cough, shortness of breath or chest pain, joint or back pain, headache, or mood change.   Objective:   No results found. No results for input(s): WBC, HGB, HCT, PLT in the last 72 hours. No results for input(s): NA, K, CL, CO2, GLUCOSE, BUN, CREATININE, CALCIUM in the last 72 hours.  Intake/Output Summary (Last 24 hours) at 01/14/2019 0957 Last data filed at 01/14/2019 0832 Gross per 24 hour  Intake 840 ml  Output -  Net 840 ml     Physical Exam: Vital Signs Blood pressure (!) 130/55, pulse 77, temperature 98.2 F (36.8 C), temperature source Oral, resp. rate 18, height 5\' 4"  (1.626 m), weight 59.2 kg, SpO2 95 %.  Constitutional: No distress . Vital signs reviewed. HEENT: EOMI, oral membranes moist Neck: supple Cardiovascular: RRR without murmur. No JVD    Respiratory: CTA Bilaterally without wheezes or rales. Normal effort    GI: BS +, non-tender, non-distended  Musculoskeletal:Normal range of motion. no shoulder pain. Left thumb tender at MCP jt espeically   Neurological: She isalert. And oriented x 3. Left C7. RUE and RLE 4/5. LUE trace to  0/5--no change today.  LLE remains 3 to 3+/5 prox to distal--no changes from baseline. Normal pain and LT in all 4's--stable Skin: Skin iswarmand dry. She isnot diaphoretic. Noerythema.  Psychiatric: pleasant   Assessment/Plan: 1. Functional deficits secondary to right MCA infarct which require 3+ hours per day of interdisciplinary therapy in a comprehensive inpatient rehab setting.  Physiatrist is providing close team supervision and 24 hour management of active medical problems listed below.  Physiatrist and rehab team continue to assess barriers to discharge/monitor patient progress toward functional and  medical goals  Care Tool:  Bathing    Body parts bathed by patient: Left arm, Abdomen, Chest, Front perineal area, Face, Right upper leg, Left upper leg, Right lower leg, Left lower leg, Buttocks   Body parts bathed by helper: Right arm     Bathing assist Assist Level: Minimal Assistance - Patient > 75%     Upper Body Dressing/Undressing Upper body dressing   What is the patient wearing?: Button up shirt    Upper body assist Assist Level: Moderate Assistance - Patient 50 - 74%    Lower Body Dressing/Undressing Lower body dressing      What is the patient wearing?: Underwear/pull up, Pants     Lower body assist Assist for lower body dressing: Moderate Assistance - Patient 50 - 74%     Toileting Toileting    Toileting assist Assist for toileting: Moderate Assistance - Patient 50 - 74%     Transfers Chair/bed transfer  Transfers assist     Chair/bed transfer assist level: Minimal Assistance - Patient > 75%     Locomotion Ambulation   Ambulation assist      Assist level: Minimal Assistance - Patient > 75% Assistive device: Walker-rolling Max distance: 62ft   Walk 10 feet activity   Assist     Assist level: Minimal Assistance - Patient > 75% Assistive device: Walker-rolling   Walk 50 feet activity   Assist Walk 50 feet with 2 turns activity did not occur: Safety/medical concerns  Assist level: Minimal Assistance - Patient > 75% Assistive device: Walker-rolling    Walk 150 feet activity   Assist Walk 150 feet activity  did not occur: Safety/medical concerns  Assist level: Moderate Assistance - Patient - 50 - 74% Assistive device: Walker-rolling    Walk 10 feet on uneven surface  activity   Assist Walk 10 feet on uneven surfaces activity did not occur: Safety/medical concerns         Wheelchair     Assist Will patient use wheelchair at discharge?: No   Wheelchair activity did not occur: N/A         Wheelchair 50 feet  with 2 turns activity    Assist    Wheelchair 50 feet with 2 turns activity did not occur: N/A       Wheelchair 150 feet activity     Assist  Wheelchair 150 feet activity did not occur: N/A       Blood pressure (!) 130/55, pulse 77, temperature 98.2 F (36.8 C), temperature source Oral, resp. rate 18, height 5\' 4"  (1.626 m), weight 59.2 kg, SpO2 95 %.  Medical Problem List and Plan: 1.Left-sided weaknesssecondary to recurrent right MCA infarct in setting of right ICA occlusion and severe left ICA stenosis.    -left WHO at HS, AFO LLE per [facility]  -continue supportive measures LUE 2. Antithrombotics: -DVT/anticoagulation:Lovenox -antiplatelet therapy: Aspirin 325 mg daily and Plavix 75 mg daily 3. Pain Management:Tylenol as needed  -will try low profile neoprene thumb spica LUE  -add voltaren gel as well 4. Mood:Provide emotional support -antipsychotic agents: N/A 5. Neuropsych: This patientiscapable of making decisions on herown behalf. 6. Skin/Wound Care:Routine skin checks 7. Fluids/Electrolytes/Nutrition: encourage PO  -intake reasonable 8. Severe left internalcarotid stenosis. Follow-up outpatient vascular surgery Dr. Carlis Abbott to consider intervention 9. Hypertension. Norvasc 5 mg daily. Controlled at present 10. Diabetes mellitus. Hemoglobin A1c 10.3.    -adjusted lantus to  qam  --->26u  -added PM lantus  --->16u   -pt is on heart healthy cm diet  -sugar labile. Avoid overtreating with SSI CBG (last 3)  Recent Labs    01/14/19 0626 01/14/19 0643 01/14/19 0732  GLUCAP 62* 66* 217*   11. Hyperlipidemia. Lipitor 12. Hypothyroidism. Continue Synthroid 13. Constipation: Needs a bm    LOS: 11 days A FACE TO FACE EVALUATION WAS PERFORMED  Meredith Staggers 01/14/2019, 9:57 AM

## 2019-01-14 NOTE — Progress Notes (Signed)
Occupational Therapy Session Note  Patient Details  Name: Danielle [name] MRN: [id number] Date of Birth: 1931/07/14  Today's Date: 01/14/2019  Session 1 OT Individual Time: [id number] OT Individual Time Calculation (min): 73 min   Session 2 OT Individual Time: 1300-1410 OT Individual Time Calculation (min): 70 min    Short Term Goals: Week 2:  OT Short Term Goal 1 (Week 2): LTG=STG 2/2 ELOS  Skilled Therapeutic Interventions/Progress Updates:  Session 1   Pt greeted seated in wc finishing breakfast and agreeable to OT treatment session. Pt declined to shower but wanted to bathe at the sink. UB bathing completed sitting in wc at the sink. OT provided hand over hand A to integrate L UE into bathing, but L thumb was tender to the touch so pt had a hard time tolerating. Blocked practice for UB dressing using hemi-techniques.Pt able to thread BLEs into pants today with verbal cues for hemi dressing strategies. Encouragement to keep trying to pull pants over hips as one handed activity takes a longer time, but pt can do it. Facilitated weight bearing through L UE within all standing tasks for neuro re-ed.  Worked on donning TED hose using friction reducing device as well as shoes and L foot up brace. Pt will likely need assistance to don foot-up brace and will educate family on this. Pt needed extended rest break after bathing/dressing. Pt reported need to go to the bathroom again, so worked on functional ambulation into bathroom. Pt needed min A to negotiate RW. Pt voided bladder and completed peri-care with set-up A. Pt ambulated back out of bathroom to the wc. Pt needed CGA overall during ambulation-verbal cues for L hand placement on RW splint. Pt left seated in wc at end of session with alarm belt on and L UE supported on lap tray.   Session 2 OT treatment session focused on pt/family education. Pt's husband [name] and daughter [name] present for hands on training. Pt brought down to therapy  apartment and practiced stand-pivot transfers with gait belt to tub bench and BSC. OT demonstrated technique for transfers, then had pt practice with both husband and daughter. Set-up BSC transfer in simulated home environment as they have a water closet and pt will have to take a few steps to get there. Pt's spouse assisted with taking a few steps to then pivot onto commode. Pt continues to need verbal cues for LLE management within transfers. Discussed home DME needs, plan of care, and OT goals. Had family also practice wc mechanics and brought up the possibility of needing a ramp for ease to access home. Spouse said he will think about it. Pt returned in room  In wc and needed to go to the bathroom. Pt's daughter assisted with stand-pivot transfer to toilet with min A and use of gait belt. Educated on how safely allow pt to do toileting tasks on her own with Min A for clothing management. Discussed plan for the weekend for further hands on family education and possibility of extended LOS in effort to decrease risk of falls at home, increase strength and independence, and prepare family for safer transition home. Pt left seated in wc at end of session with family present and case manager entering room.  Therapy Documentation Precautions:  Precautions Precautions: Fall Precaution Comments: L hemiparesis Restrictions Weight Bearing Restrictions: No Pain:  Mild pain in L thumb, wrapped in coband and repositioned for comfort   Therapy/Group: Individual Therapy  Valma Cava 01/14/2019, 3:24 PM

## 2019-01-14 NOTE — Plan of Care (Signed)
  Problem: RH Balance Goal: LTG Patient will maintain dynamic standing balance (PT) Description: LTG:  Patient will maintain dynamic standing balance with assistance during mobility activities (PT) Flowsheets (Taken 01/14/2019 1252) LTG: Pt will maintain dynamic standing balance during mobility activities with:: (downgraded aBG) Contact Guard/Touching assist Note: Downgraded for safety ABG   Problem: Sit to Stand Goal: LTG:  Patient will perform sit to stand with assistance level (PT) Description: LTG:  Patient will perform sit to stand with assistance level (PT) Flowsheets (Taken 01/14/2019 1253) LTG: PT will perform sit to stand in preparation for functional mobility with assistance level: (Downgraded ABG) Contact Guard/Touching assist Note: Downgraded  for safety ABG   Problem: RH Bed Mobility Goal: LTG Patient will perform bed mobility with assist (PT) Description: LTG: Patient will perform bed mobility with assistance, with/without cues (PT). Flowsheets (Taken 01/14/2019 1253) LTG: Pt will perform bed mobility with assistance level of: (Downgraded ABG) Contact Guard/Touching assist Note: Downgraded ABG   Problem: RH Bed to Chair Transfers Goal: LTG Patient will perform bed/chair transfers w/assist (PT) Description: LTG: Patient will perform bed to chair transfers with assistance (PT). Flowsheets (Taken 01/14/2019 1253) LTG: Pt will perform Bed to Chair Transfers with assistance level: (Downgraded ABG) Contact Guard/Touching assist Note: Downgraded ABG   Problem: RH Furniture Transfers Goal: LTG Patient will perform furniture transfers w/assist (OT/PT) Description: LTG: Patient will perform furniture transfers  with assistance (OT/PT). Flowsheets (Taken 01/14/2019 1253) LTG: Pt will perform furniture transfers with assist:: (Downgraded ABG) Contact Guard/Touching assist Note: Downgraded ABG   Problem: RH Stairs Goal: LTG Patient will ambulate up and down stairs w/assist  (PT) Description: LTG: Patient will ambulate up and down # of stairs with assistance (PT) Flowsheets (Taken 01/14/2019 1253) LTG: Pt will ambulate up/down stairs assist needed:: (modified due to home set-up ABG) Minimal Assistance - Patient > 75% LTG: Pt will  ambulate up and down number of stairs: 4 platform steps with RW Note: Modified due to home access is actually 4 platform steps using RW

## 2019-01-14 NOTE — Progress Notes (Signed)
Orthopedic Tech Progress Note Patient Details:  Danielle [name] [birth year] [id number]  Patient ID: Danielle [name], female   DOB: 09-23-1931, 85 y.o.   MRN: [id number]  Called in order to [facility] 01/14/2019, 10:23 AM

## 2019-01-14 NOTE — Progress Notes (Signed)
Physical Therapy Session Note  Patient Details  Name: Danielle [name] MRN: [id number] Date of Birth: 1931/12/09  Today's Date: 01/14/2019 PT Individual Time: 1105-1205 PT Individual Time Calculation (min): 60 min   Short Term Goals: Week 1:  PT Short Term Goal 1 (Week 1): Pt will ambulate 25' w/ min assist PT Short Term Goal 1 - Progress (Week 1): Met PT Short Term Goal 2 (Week 1): Pt will perform dynamic standing balance tasks w/ min assist PT Short Term Goal 2 - Progress (Week 1): Met PT Short Term Goal 3 (Week 1): Pt will negotiate 2 steps w/ R rail w/ min assist PT Short Term Goal 3 - Progress (Week 1): Met Week 2:  PT Short Term Goal 1 (Week 2): =LTGs due to ELOS  Skilled Therapeutic Interventions/Progress Updates:    Session focused on family education with pt's daughter, [name], and husband, [name]. Issued handout for calf and hamstring stretching for home use. Educated family and patient on use of foot--up brace and toe cap purpose (still need to practice donning and doffing), lap tray on w/c, positioning of and attention to LUE during mobility and in bed, fall risk due to significant balance impairments, and recommendation for hands on during upright mobility due to h/o multiple falls and high fall risk now. Hands on demonstration and return demonstration for sit <> stands, transfers with RW (hand orthosis and foot brace on L), short distance household gait with RW, and curb step negotiation. Family reports home entry is actually several platform steps (5) and there is one section with a rail but does not cover all of it and would be better to use RW. Pt overall requires min assist to CGA during session due to posterior bias and LOB to the L during turning and recommending using gait belt with family for mobility. Both able to return demonstrate but require cues for correct sequencing and positioning to anticipate LOB and min physical intervention from PT for safety. Pt impulsive with  mobility at times and requires mod verbal cues for safe management of RW during mobility as well as positioning of self. Discussed recommendation for continued family education over the weekend and Monday as we did not get to complete all activities and would benefit from further repetition for items we did cover. Daughter asked about extending LOS. Would need to discuss with team but suggested to continue fam ed over the weekend and Monday and re-evaluate.   Husband is primary caregiver and needs to be fully trained prior to d/c. Daughter is only available end of week and over weekend due to her work schedule and is out of town next week. Recommended hands on every session from here on out. Both were agreeable and want as much practice as possible.   Therapy Documentation Precautions:  Precautions Precautions: Fall Precaution Comments: L hemiparesis Restrictions Weight Bearing Restrictions: No Pain: Reports pain still in thumb with movement or to touch. Repositioned as able to to tolerance.    Therapy/Group: Individual Therapy  Canary Brim Ivory Broad, PT, DPT, CBIS  01/14/2019, 12:25 PM

## 2019-01-14 NOTE — Progress Notes (Signed)
HYPOGLYCEMIC EVENT  01/14/2019 0626: NT alerted RN that pt CBG reading was 62. Hypoglycemic protocol was implemented. Pt is asymptomatic with call bell at side. RN will continue to monitor.

## 2019-01-15 ENCOUNTER — Ambulatory Visit (HOSPITAL_COMMUNITY): Payer: Medicare Other | Admitting: Physical Therapy

## 2019-01-15 LAB — GLUCOSE, CAPILLARY
Glucose-Capillary: 54 mg/dL — ABNORMAL LOW (ref 70–99)
Glucose-Capillary: 141 mg/dL — ABNORMAL HIGH (ref 70–99)
Glucose-Capillary: 171 mg/dL — ABNORMAL HIGH (ref 70–99)
Glucose-Capillary: 317 mg/dL — ABNORMAL HIGH (ref 70–99)
Glucose-Capillary: 110 mg/dL — ABNORMAL HIGH (ref 70–99)

## 2019-01-15 MED ORDER — INSULIN GLARGINE 100 UNIT/ML ~~LOC~~ SOLN
10.0000 [IU] | Freq: Every day | SUBCUTANEOUS | Status: DC
  Administered 2019-01-15: 21:00:00 10 [IU] via SUBCUTANEOUS
  Filled 2019-01-15 (×2): qty 0.1

## 2019-01-15 NOTE — Progress Notes (Signed)
Physical Therapy Session Note  Patient Details  Name: Danielle [name] MRN: [id number] Date of Birth: [birth year]  Today's Date: 01/15/2019 PT Individual Time: 1100-1157 PT Individual Time Calculation (min): 57 min   Short Term Goals: Week 2:  PT Short Term Goal 1 (Week 2): =LTGs due to ELOS  Skilled Therapeutic Interventions/Progress Updates:   Pt received supine in bed and agreeable to PT. Supine>sit transfer with min assist for control and attention to the LUE and LLE. PT donned pt's shoes sitting EOB and foot up brace. Ambulatory transfer to [facility] with RW and min assist for Safety. Pt transferred to rehab gym in [facility]. Treatment session focused on family education to husband, and daughter present. Stair management with L side rail 4x 2  With min assist overal from PT on first bout and husband on second. Moderate cues to family on safe guarding technique, hand position to control trunk, and improve clarity of instruction due to L side inattention. Gait training with PT and husband with RW and L hand orthotic x 71f each. CGA from PT and min assist from husband with cues for awareness of RW position and cues for AD management. WC mobility with hemi technique x 1032fand moderate cues for sequencing and attention to the L side. PT encouraged husband to have pt use WC fo mobility as much as possible when at home. Car transfer training with min assist from PT x 1 and husband x 1. Moderate cues for AD management, step length use of giat belt, and safe technique to prevent L LOB while sitting edge of car seat. Patient returned to room and [facility] reach and all needs met.  Throughout treatment husband denies pt having awareness deficits and deficits into safety, regardless of instruction from PT or demonstration of inattention to the L side.        Therapy Documentation Precautions:  Precautions Precautions: Fall Precaution Comments: L hemiparesis Restrictions Weight  Bearing Restrictions: No   Pain: Pain Assessment Pain Scale: 0-10 Pain Score: 3  Pain Type: Acute pain Pain Location: Finger (Comment which one)(thumg) Pain Orientation: Left Pain Descriptors / Indicators: Discomfort Pain Frequency: Intermittent Pain Onset: Gradual Patients Stated Pain Goal: 0 Pain Intervention(s): Medication (See eMAR)   Therapy/Group: Individual Therapy  AuLorie Phenix/29/2020, 11:58 AM

## 2019-01-15 NOTE — Progress Notes (Signed)
[place] PHYSICAL MEDICINE & REHABILITATION PROGRESS NOTE   Subjective/Complaints: No new complaints, left thumb remains sore.   ROS: Patient denies fever, rash, sore throat, blurred vision, nausea, vomiting, diarrhea, cough, shortness of breath or chest pain, headache, or mood change.    Objective:   No results found. No results for input(s): WBC, HGB, HCT, PLT in the last 72 hours. No results for input(s): NA, K, CL, CO2, GLUCOSE, BUN, CREATININE, CALCIUM in the last 72 hours.  Intake/Output Summary (Last 24 hours) at 01/15/2019 0923 Last data filed at 01/15/2019 0800 Gross per 24 hour  Intake 840 ml  Output -  Net 840 ml     Physical Exam: Vital Signs Blood pressure (!) 129/50, pulse 83, temperature 98.1 F (36.7 C), temperature source Oral, resp. rate 18, height 5\' 4"  (1.626 m), weight 59.2 kg, SpO2 97 %.  Constitutional: No distress . Vital signs reviewed. HEENT: EOMI, oral membranes moist Neck: supple Cardiovascular: RRR without murmur. No JVD    Respiratory: CTA Bilaterally without wheezes or rales. Normal effort    GI: BS +, non-tender, non-distended  Musculoskeletal:Normal range of motion. no shoulder pain. Left thumb tender at MCP jt---K tape wrapped   Neurological: She isalert. And oriented x 3. Left C7. RUE and RLE 4/5. LUE trace to  0/5--no change today.  LLE remains 3 to 3+/5 prox to distal--no changes from baseline. Normal pain and LT in all 4's--stable Skin: Skin iswarmand dry. She isnot diaphoretic. Noerythema.  Psychiatric: pleasant as always   Assessment/Plan: 1. Functional deficits secondary to right MCA infarct which require 3+ hours per day of interdisciplinary therapy in a comprehensive inpatient rehab setting.  Physiatrist is providing close team supervision and 24 hour management of active medical problems listed below.  Physiatrist and rehab team continue to assess barriers to discharge/monitor patient progress toward functional and  medical goals  Care Tool:  Bathing    Body parts bathed by patient: Left arm, Abdomen, Chest, Front perineal area, Face, Right upper leg, Left upper leg, Right lower leg, Left lower leg, Buttocks   Body parts bathed by helper: Right arm     Bathing assist Assist Level: Minimal Assistance - Patient > 75%     Upper Body Dressing/Undressing Upper body dressing   What is the patient wearing?: Button up shirt    Upper body assist Assist Level: Moderate Assistance - Patient 50 - 74%    Lower Body Dressing/Undressing Lower body dressing      What is the patient wearing?: Underwear/pull up, Pants     Lower body assist Assist for lower body dressing: Moderate Assistance - Patient 50 - 74%     Toileting Toileting    Toileting assist Assist for toileting: Moderate Assistance - Patient 50 - 74%     Transfers Chair/bed transfer  Transfers assist     Chair/bed transfer assist level: Minimal Assistance - Patient > 75%     Locomotion Ambulation   Ambulation assist      Assist level: Minimal Assistance - Patient > 75% Assistive device: Orthosis Max distance: 50'   Walk 10 feet activity   Assist     Assist level: Contact Guard/Touching assist Assistive device: Walker-rolling, Orthosis   Walk 50 feet activity   Assist Walk 50 feet with 2 turns activity did not occur: Safety/medical concerns  Assist level: Minimal Assistance - Patient > 75% Assistive device: Walker-rolling, Orthosis    Walk 150 feet activity   Assist Walk 150 feet activity did not  occur: Safety/medical concerns  Assist level: Moderate Assistance - Patient - 50 - 74% Assistive device: Walker-rolling    Walk 10 feet on uneven surface  activity   Assist Walk 10 feet on uneven surfaces activity did not occur: Safety/medical concerns         Wheelchair     Assist Will patient use wheelchair at discharge?: No   Wheelchair activity did not occur: N/A         Wheelchair  50 feet with 2 turns activity    Assist    Wheelchair 50 feet with 2 turns activity did not occur: N/A       Wheelchair 150 feet activity     Assist  Wheelchair 150 feet activity did not occur: N/A       Blood pressure (!) 129/50, pulse 83, temperature 98.1 F (36.7 C), temperature source Oral, resp. rate 18, height 5\' 4"  (1.626 m), weight 59.2 kg, SpO2 97 %.  Medical Problem List and Plan: 1.Left-sided weaknesssecondary to recurrent right MCA infarct in setting of right ICA occlusion and severe left ICA stenosis.    -left WHO at HS, AFO LLE per [facility] requested  -continue supportive measures LUE 2. Antithrombotics: -DVT/anticoagulation:Lovenox -antiplatelet therapy: Aspirin 325 mg daily and Plavix 75 mg daily 3. Pain Management:Tylenol as needed  -ordered low profile neoprene thumb spica LUE  -  voltaren gel TID to left hand/thumb 4. Mood:Provide emotional support -antipsychotic agents: N/A 5. Neuropsych: This patientiscapable of making decisions on herown behalf. 6. Skin/Wound Care:Routine skin checks 7. Fluids/Electrolytes/Nutrition: encourage PO  -intake reasonable 8. Severe left internalcarotid stenosis. Follow-up outpatient vascular surgery Dr. Carlis Abbott to consider intervention 9. Hypertension. Norvasc 5 mg daily. Controlled at present 10. Diabetes mellitus. Hemoglobin A1c 10.3.  -AM CBG low again today 56    - AM lantus  26u  -  PM lantus currently16u ----reduce to 10u tonight  -pt is on heart healthy cm diet  -  CBG (last 3)  Recent Labs    01/14/19 2139 01/15/19 0606 01/15/19 0633  GLUCAP 110* 54* 141*   11. Hyperlipidemia. Lipitor 12. Hypothyroidism. Continue Synthroid 13. Constipation: Needs a bm    LOS: 12 days A FACE TO [place] 01/15/2019, 9:23 AM

## 2019-01-16 ENCOUNTER — Ambulatory Visit (HOSPITAL_COMMUNITY): Payer: Medicare Other

## 2019-01-16 ENCOUNTER — Encounter (HOSPITAL_COMMUNITY): Payer: Medicare Other

## 2019-01-16 LAB — GLUCOSE, CAPILLARY
Glucose-Capillary: 44 mg/dL — CL (ref 70–99)
Glucose-Capillary: 67 mg/dL — ABNORMAL LOW (ref 70–99)
Glucose-Capillary: 99 mg/dL (ref 70–99)
Glucose-Capillary: 352 mg/dL — ABNORMAL HIGH (ref 70–99)
Glucose-Capillary: 194 mg/dL — ABNORMAL HIGH (ref 70–99)
Glucose-Capillary: 229 mg/dL — ABNORMAL HIGH (ref 70–99)

## 2019-01-16 NOTE — Progress Notes (Signed)
Physical Therapy Session Note  Patient Details  Name: COLLYN OWYANG MRN: [id number] Date of Birth: Sep 21, 1931  Today's Date: 01/16/2019 PT Individual Time: 1400-1500 PT Individual Time Calculation (min): 60 min   Short Term Goals: Week 2:  PT Short Term Goal 1 (Week 2): =LTGs due to ELOS  Skilled Therapeutic Interventions/Progress Updates:    Pt seated in w/c upon PT arrival, agreeable to therapy tx and reports pain in L thumb intermittently throughout the session. Pt's husband and daughter present this session for family education. Pt transported to the gym. Pt ascended/desended platform step with RW and min assist x 1 with assist from therapist and x 2 with husband providing assist and cues, therapist providing education on techniques, proper guarding and approriate cues for husband to provide. Pt ascended/descended 4 steps with L rail and min assist x 1 with assist from therapist and x 1 with assist from her husband, education provided throughout. Educated family on safety with stair negotiation and fall risk. Pt worked on household ambulation with the RW. Pt ambulated from w/c in the hallway>into rehab apartment with RW and min assist from husband x 60 ft, performed couch transfer with assist from husband, ambulated to recliner and performed recliner transfer with min assist from husband and using RW. Therapist providing cues and education throughout. Pt ambulated to the bed with RW and assist from husband, performed bed mobility this session with assist from husband. Discussed home set up and height of bed at home, also educated family on home modifications in order to reduce fall risk. Pt transported back to room and left in w/c with needs in reach and chair alarm set, family present.   Therapy Documentation Precautions:  Precautions Precautions: Fall Precaution Comments: L hemiparesis Restrictions Weight Bearing Restrictions: No    Therapy/Group: Individual Therapy  Netta Corrigan, PT, DPT 01/16/2019, 8:18 AM

## 2019-01-16 NOTE — Progress Notes (Signed)
PHYSICAL MEDICINE & REHABILITATION PROGRESS NOTE   Subjective/Complaints: Up eating breakfast. cbg low this morning. RN states she missed HS snack last night d/t some mild nausea  ROS: Patient denies fever, rash, sore throat, blurred vision,   vomiting, diarrhea, cough, shortness of breath or chest pain,   headache, or mood change.   Objective:   No results found. No results for input(s): WBC, HGB, HCT, PLT in the last 72 hours. No results for input(s): NA, K, CL, CO2, GLUCOSE, BUN, CREATININE, CALCIUM in the last 72 hours.  Intake/Output Summary (Last 24 hours) at 01/16/2019 0907 Last data filed at 01/16/2019 0720 Gross per 24 hour  Intake 660 ml  Output -  Net 660 ml     Physical Exam: Vital Signs Blood pressure (!) 134/48, pulse 74, temperature 98 F (36.7 C), temperature source Oral, resp. rate 15, height 5\' 4"  (1.626 m), weight 59.2 kg, SpO2 95 %.  Constitutional: No distress . Vital signs reviewed. HEENT: EOMI, oral membranes moist Neck: supple Cardiovascular: RRR without murmur. No JVD    Respiratory: CTA Bilaterally without wheezes or rales. Normal effort    GI: BS +, non-tender, non-distended  Musculoskeletal:Normal range of motion. no shoulder pain. Left thumb tender at MCP jt---K tape still in place   Neurological: She isalert. And oriented x 3. Left C7. RUE and RLE 4/5. LUE trace to  0/5--no change today.  LLE remains 3 to 3+/5 prox to distal--no changes from baseline. Normal pain and LT in all 4's--no changes today Skin: Skin iswarmand dry. She isnot diaphoretic. Noerythema.  Psychiatric: pleasant as always   Assessment/Plan: 1. Functional deficits secondary to right MCA infarct which require 3+ hours per day of interdisciplinary therapy in a comprehensive inpatient rehab setting.  Physiatrist is providing close team supervision and 24 hour management of active medical problems listed below.  Physiatrist and rehab team continue to assess  barriers to discharge/monitor patient progress toward functional and medical goals  Care Tool:  Bathing    Body parts bathed by patient: Left arm, Abdomen, Chest, Front perineal area, Face, Right upper leg, Left upper leg, Right lower leg, Left lower leg, Buttocks   Body parts bathed by helper: Right arm     Bathing assist Assist Level: Minimal Assistance - Patient > 75%     Upper Body Dressing/Undressing Upper body dressing   What is the patient wearing?: Button up shirt    Upper body assist Assist Level: Moderate Assistance - Patient 50 - 74%    Lower Body Dressing/Undressing Lower body dressing      What is the patient wearing?: Underwear/pull up, Pants     Lower body assist Assist for lower body dressing: Moderate Assistance - Patient 50 - 74%     Toileting Toileting    Toileting assist Assist for toileting: Moderate Assistance - Patient 50 - 74%     Transfers Chair/bed transfer  Transfers assist     Chair/bed transfer assist level: Contact Guard/Touching assist     Locomotion Ambulation   Ambulation assist      Assist level: Contact Guard/Touching assist Assistive device: Walker-rolling Max distance: 25   Walk 10 feet activity   Assist     Assist level: Contact Guard/Touching assist Assistive device: Walker-rolling   Walk 50 feet activity   Assist Walk 50 feet with 2 turns activity did not occur: Safety/medical concerns  Assist level: Minimal Assistance - Patient > 75% Assistive device: Walker-rolling, Orthosis    Walk 150 feet  activity   Assist Walk 150 feet activity did not occur: Safety/medical concerns  Assist level: Moderate Assistance - Patient - 50 - 74% Assistive device: Walker-rolling    Walk 10 feet on uneven surface  activity   Assist Walk 10 feet on uneven surfaces activity did not occur: Safety/medical concerns         Wheelchair     Assist Will patient use wheelchair at discharge?: Yes Type of  Wheelchair: Manual Wheelchair activity did not occur: N/A  Wheelchair assist level: Minimal Assistance - Patient > 75% Max wheelchair distance: 100    Wheelchair 50 feet with 2 turns activity    Assist    Wheelchair 50 feet with 2 turns activity did not occur: N/A   Assist Level: Minimal Assistance - Patient > 75%   Wheelchair 150 feet activity     Assist  Wheelchair 150 feet activity did not occur: N/A       Blood pressure (!) 134/48, pulse 74, temperature 98 F (36.7 C), temperature source Oral, resp. rate 15, height 5\' 4"  (1.626 m), weight 59.2 kg, SpO2 95 %.  Medical Problem List and Plan: 1.Left-sided weaknesssecondary to recurrent right MCA infarct in setting of right ICA occlusion and severe left ICA stenosis.    -left WHO at HS, AFO LLE per [facility] requested  -continue supportive measures LUE 2. Antithrombotics: -DVT/anticoagulation:Lovenox -antiplatelet therapy: Aspirin 325 mg daily and Plavix 75 mg daily 3. Pain Management:Tylenol as needed  -ordered low profile neoprene thumb spica LUE per [facility]--still waiting on it  - voltaren gel TID to left hand/thumb 4. Mood:Provide emotional support -antipsychotic agents: N/A 5. Neuropsych: This patientiscapable of making decisions on herown behalf. 6. Skin/Wound Care:Routine skin checks 7. Fluids/Electrolytes/Nutrition: encourage PO  -intake reasonable 8. Severe left internalcarotid stenosis. Follow-up outpatient vascular surgery Dr. Carlis Abbott to consider intervention 9. Hypertension. Norvasc 5 mg daily. Controlled at present 10. Diabetes mellitus. Hemoglobin A1c 10.3.  -AM CBG low again today:44   -continue AM lantus  26u  -  Dc PM lantus  -pt is on heart healthy cm diet  -  CBG (last 3)  Recent Labs    01/16/19 0618 01/16/19 0641 01/16/19 0649  GLUCAP 44* 67* 99   11. Hyperlipidemia. Lipitor 12. Hypothyroidism. Continue Synthroid 13.  Constipation: Needs a bm    LOS: 13 days A FACE TO [place] 01/16/2019, 9:07 AM

## 2019-01-16 NOTE — Progress Notes (Signed)
Occupational Therapy Session Note  Patient Details  Name: Danielle [name] MRN: [id number] Date of Birth: 1932-02-21  Today's Date: 01/16/2019 OT Individual Time: 1100-1209 OT Individual Time Calculation (min): 69 min    Short Term Goals: Week 2:  OT Short Term Goal 1 (Week 2): LTG=STG 2/2 ELOS  Skilled Therapeutic Interventions/Progress Updates:    Pt received sitting up in w/c with daughter and husband. Both not wearing masks and asked to put on, both complying. Focus of family education session was on RUE NMR, per family request. Extensive time and demonstration provided re positioning and edu re musculoskeletal/neuromuscular causes/implications. Both had several questions re different situations in which to position arm differently, and all answered. Discussed HEP and motor recovery, including providing specific examples of activities to do and how often. Pt's daughter had questions re e- stim use. Stressed importance of e-stim use being supervised by licensed OT/PT initially to ensure proper use. Demonstrated use on biceps and demonstrated how to provide AAROM. Pt completed 100 ft of functional mobility with husband providing physical support. Cueing needed for caregiver body mechanics to avoid injury. Reinforced support needed for RUE. Pt had 1 slight posterior LOB when maneuvering turn to w/c. Mod cueing required for pt and family re safety and fall risk reduction. All further questions answered to family's satisfaction. Pt was left sitting up in the w/c with family and NT present.   Therapy Documentation Precautions:  Precautions Precautions: Fall Precaution Comments: L hemiparesis Restrictions Weight Bearing Restrictions: No   Therapy/Group: Individual Therapy  Curtis Sites 01/16/2019, 7:24 AM

## 2019-01-16 NOTE — Significant Event (Signed)
Hypoglycemic Event  CBG: 44  Treatment: 4 oz juice/soda/snacks  Symptoms: Hungry  Follow-up CBG: [id number] CBG Result:99  Possible Reasons for Event: Inadequate meal intake/medication regimen(Lantus)  Comments/MD notified:Dr.Swartz    Nepal Naod [place]

## 2019-01-17 ENCOUNTER — Ambulatory Visit (HOSPITAL_COMMUNITY): Payer: Medicare Other

## 2019-01-17 ENCOUNTER — Encounter (HOSPITAL_COMMUNITY): Payer: Medicare Other | Admitting: Occupational Therapy

## 2019-01-17 ENCOUNTER — Inpatient Hospital Stay (HOSPITAL_COMMUNITY): Payer: Medicare Other | Admitting: Occupational Therapy

## 2019-01-17 LAB — GLUCOSE, CAPILLARY
Glucose-Capillary: 217 mg/dL — ABNORMAL HIGH (ref 70–99)
Glucose-Capillary: 230 mg/dL — ABNORMAL HIGH (ref 70–99)
Glucose-Capillary: 221 mg/dL — ABNORMAL HIGH (ref 70–99)
Glucose-Capillary: 213 mg/dL — ABNORMAL HIGH (ref 70–99)

## 2019-01-17 MED ORDER — DICLOFENAC SODIUM 1 % TD GEL
2.0000 g | Freq: Four times a day (QID) | TRANSDERMAL | Status: DC
  Administered 2019-01-17 – 2019-01-21 (×15): 2 g via TOPICAL
  Filled 2019-01-17: qty 100

## 2019-01-17 NOTE — Progress Notes (Signed)
Orthopedic Tech Progress Note Patient Details:  Danielle [name] 1931-07-22 [id number] Called [facility] again about thumb spica. They said it should be brought in and applied today. Patient ID: Danielle [name], female   DOB: 03/15/1932, 85 y.o.   MRN: [id number]   [name] T 01/17/2019, 9:45 AM

## 2019-01-17 NOTE — Progress Notes (Signed)
Physical Therapy Session Note  Patient Details  Name: Danielle [name] MRN: [id number] Date of Birth: Jun 05, 1931  Today's Date: 01/17/2019 PT Individual Time: 1100-1158 PT Individual Time Calculation (min): 58 min   Short Term Goals: Week 2:  PT Short Term Goal 1 (Week 2): =LTGs due to ELOS  Skilled Therapeutic Interventions/Progress Updates:    Session focused on continuing family education with pt and pt's husband, [name]. Spent a fair amount of time on education and discussion/problem solving plans for home including pt expressing concern and fear of falling at home and only having husband, marshall there. "what if something happens to him when helping me?" Pt tearful and emotional support provided. They discussed possibility of hiring caregivers for a couple hours a day to help out. Pt seemed to push this more and husband a bit more resistant. Educated on ways to decrease fall risk and encouraged using w/c more in the house, use of a walkie talkie or baby monitor (with video) system to communicate if he is outside (she is concerned he won't hear his cell phone and he agrees that he doesn't use it), setting up a routine/schedule to be able to better anticipate times of needed assist (discussed that toileting will not follow this and they will need to have a plan for this. Did recommend BSC at bedside during nighttime so that they only need to do transfers and not walk into the bathroom). Pt reports a ramp was installed for the front entrance but husband resistant to using it, stating he just don't use that door. Educated on increased fall risk with stairs and that using a ramp (that is already installed now) would be the safer option. Pt in agreement and husband denies that it is needed.   Focused on hands on demonstration by patient and husband with PT backing off with cues in the ADL apartment to simulate home mobility including furniture transfers, bed transfers and bed mobility to taller  bed, and gait in carpeted surface with RW (L hand orthosis and L foot up brace). Pt required overall min assist for all tasks and husband able to provide appropriate support physically. Does still require some cues for body positioning and sequencing of sit <> stands and w/c parts set-up. Also required education on proper time to use gait belt (initially he was not going to use it because we were practicing walking to the bed as if preparing for bed. "well she isn't going to sleep with it" but educated that she would need to wear it during the transfers and then take it off before laying down.   Therapy Documentation Precautions:  Precautions Precautions: Fall Precaution Comments: L hemiparesis Restrictions Weight Bearing Restrictions: No    Pain:  c/o tenderness in L thumb - brace was on and appears to help.   Therapy/Group: Individual Therapy  Canary Brim Ivory Broad, PT, DPT, CBIS  01/17/2019, 12:18 PM

## 2019-01-17 NOTE — Progress Notes (Signed)
[place] PHYSICAL MEDICINE & REHABILITATION PROGRESS NOTE   Subjective/Complaints:   Pt reports doing "fair"- but L hand is numb and L thumb very sore/tender to touch and hard to tolerate being touched at all due to discomfort- asking about thumb spica splint.    ROS: Patient denies fever, rash, sore throat, blurred vision,   vomiting, diarrhea, cough, shortness of breath or chest pain,   headache, or mood change.   Objective:   No results found. No results for input(s): WBC, HGB, HCT, PLT in the last 72 hours. No results for input(s): NA, K, CL, CO2, GLUCOSE, BUN, CREATININE, CALCIUM in the last 72 hours.  Intake/Output Summary (Last 24 hours) at 01/17/2019 0930 Last data filed at 01/17/2019 0810 Gross per 24 hour  Intake 660 ml  Output -  Net 660 ml     Physical Exam: Vital Signs Blood pressure 138/62, pulse 80, temperature 98.1 F (36.7 C), resp. rate 17, height 5\' 4"  (1.626 m), weight 59.2 kg, SpO2 95 %.  Constitutional: No distress . Vital signs reviewed. Pt sitting up with OT in room; at sink, doing bathing, NAD, Avoiding using L hand HEENT: EOMI, oral membranes moist Neck: supple Cardiovascular: RRR without murmur. No JVD    Respiratory: CTA Bilaterally without wheezes or rales. Normal effort    GI: BS +, non-tender, non-distended  Musculoskeletal:Normal range of motion. no shoulder pain. Left thumb tender at MCP jt---no taping noted   Neurological: She isalert. And oriented x 3. Left C7. RUE and RLE 4/5. LUE trace to  0/5--no change today.  LLE remains 3 to 3+/5 prox to distal--no changes from baseline. Normal pain and LT in all 4's--no changes today Skin: Skin iswarmand dry. She isnot diaphoretic. Noerythema.  Psychiatric: pleasant as always   Assessment/Plan: 1. Functional deficits secondary to right MCA infarct which require 3+ hours per day of interdisciplinary therapy in a comprehensive inpatient rehab setting.  Physiatrist is providing close team  supervision and 24 hour management of active medical problems listed below.  Physiatrist and rehab team continue to assess barriers to discharge/monitor patient progress toward functional and medical goals  Care Tool:  Bathing    Body parts bathed by patient: Left arm, Abdomen, Chest, Front perineal area, Face, Right upper leg, Left upper leg, Right lower leg, Left lower leg, Buttocks   Body parts bathed by helper: Right arm     Bathing assist Assist Level: Minimal Assistance - Patient > 75%     Upper Body Dressing/Undressing Upper body dressing   What is the patient wearing?: Button up shirt    Upper body assist Assist Level: Moderate Assistance - Patient 50 - 74%    Lower Body Dressing/Undressing Lower body dressing      What is the patient wearing?: Underwear/pull up, Pants     Lower body assist Assist for lower body dressing: Moderate Assistance - Patient 50 - 74%     Toileting Toileting    Toileting assist Assist for toileting: Moderate Assistance - Patient 50 - 74%     Transfers Chair/bed transfer  Transfers assist     Chair/bed transfer assist level: Contact Guard/Touching assist     Locomotion Ambulation   Ambulation assist      Assist level: Minimal Assistance - Patient > 75% Assistive device: Walker-rolling Max distance: 60 ft   Walk 10 feet activity   Assist     Assist level: Minimal Assistance - Patient > 75% Assistive device: Walker-rolling   Walk 50 feet activity  Assist Walk 50 feet with 2 turns activity did not occur: Safety/medical concerns  Assist level: Minimal Assistance - Patient > 75% Assistive device: Walker-rolling, Orthosis    Walk 150 feet activity   Assist Walk 150 feet activity did not occur: Safety/medical concerns  Assist level: Moderate Assistance - Patient - 50 - 74% Assistive device: Walker-rolling    Walk 10 feet on uneven surface  activity   Assist Walk 10 feet on uneven surfaces activity did  not occur: Safety/medical concerns         Wheelchair     Assist Will patient use wheelchair at discharge?: Yes Type of Wheelchair: Manual Wheelchair activity did not occur: N/A  Wheelchair assist level: Minimal Assistance - Patient > 75% Max wheelchair distance: 100    Wheelchair 50 feet with 2 turns activity    Assist    Wheelchair 50 feet with 2 turns activity did not occur: N/A   Assist Level: Minimal Assistance - Patient > 75%   Wheelchair 150 feet activity     Assist  Wheelchair 150 feet activity did not occur: N/A       Blood pressure 138/62, pulse 80, temperature 98.1 F (36.7 C), resp. rate 17, height 5\' 4"  (1.626 m), weight 59.2 kg, SpO2 95 %.  Medical Problem List and Plan: 1.Left-sided weaknesssecondary to recurrent right MCA infarct in setting of right ICA occlusion and severe left ICA stenosis.    -left WHO at HS, AFO LLE per [facility] requested  -continue supportive measures LUE 2. Antithrombotics: -DVT/anticoagulation:Lovenox -antiplatelet therapy: Aspirin 325 mg daily and Plavix 75 mg daily 3. Pain Management:Tylenol as needed  -ordered low profile neoprene thumb spica LUE per [facility]--still waiting on it  - voltaren gel TID to left hand/thumb  8/31- changed voltaren to QID- helpful but wears off early 4. Mood:Provide emotional support -antipsychotic agents: N/A 5. Neuropsych: This patientiscapable of making decisions on herown behalf. 6. Skin/Wound Care:Routine skin checks 7. Fluids/Electrolytes/Nutrition: encourage PO  -intake reasonable 8. Severe left internalcarotid stenosis. Follow-up outpatient vascular surgery Dr. Carlis Abbott to consider intervention 9. Hypertension. Norvasc 5 mg daily. Controlled at present 10. Diabetes mellitus. Hemoglobin A1c 10.3.  -AM CBG low again today:44   -continue AM lantus  26u  -  Dc PM lantus  -pt is on heart healthy cm diet  8/31- BGs 194-229-no low  BGs seen.   -  CBG (last 3)  Recent Labs    01/16/19 1714 01/16/19 2121 01/17/19 0630  GLUCAP 194* 229* 217*   11. Hyperlipidemia. Lipitor 12. Hypothyroidism. Continue Synthroid 13. Constipation: Needs a bm  8/31- LBM last night    LOS: 14 days A FACE TO FACE EVALUATION WAS PERFORMED  Danielle [name] 01/17/2019, 9:30 AM

## 2019-01-17 NOTE — Progress Notes (Signed)
Occupational Therapy Session Note  Patient Details  Name: Danielle [name] MRN: [id number] Date of Birth: 1931/07/26  Today's Date: 01/17/2019  Session 1 OT Individual Time: 5284-1324 OT Individual Time Calculation (min): 72 min   Session 2 OT Individual Time: 4010-2725 OT Individual Time Calculation (min): 75 min   Short Term Goals: Week 2:  OT Short Term Goal 1 (Week 2): LTG=STG 2/2 ELOS  Skilled Therapeutic Interventions/Progress Updates:  Session 1   Pt greeted sitting in recliner and agreeable to OT treatment session. Worked on anterior weight shift with sit<>stands from recliner. CGA for stand-pivot transfer to wc. UB bathing/dressing completed at the sink with focus on hemi techniques and neuro re-ed within bathing tasks. Pt with improved recall of hemi-dressing techniques and was able to thread R UE through sleeve today. Facilitated L forearm pronation and forced weight bearing through L UE with standing grooming tasks at the sink. OT applied Voltaren gel to L thumb for pain management. OT provided gentle ROM and stretching to L shoulder, elbow, wrist 2/2 increased flexor tone. Pt request to return to bed at end of session and completed stand-pivot with CGA and verbal cues for anterior weight shift when standing from wc. Pt left semi-reclined in bed with bed alarm on and needs met.   Session 2 Pt greeted seated in wc and agreeable to OT treatment session. Pt with new thumb spica splint on, but placed over top of coband. OT removed splint and removed coband. Pt with increased swelling in L hand, so OT applied kinesiotape for edema management. Applied 1:1 NMES to CH1 supraspinatus and middle deltoid to help approximate shoulder joint, and Ch 2 wrist extensors.  Ratio 1:1 Rate 35 pps Waveform- Asymmetric Ramp 1.0 Pulse 300 CH1 Intensity- 21 Duration -  15  CH Intensity- 15 Duration -  15  L UE NMR with towel pushes on high-low table. Pt with increased R shoulder/elbow  flexion, but unable to activate extensors. Pt returned to room at end of session and left seated in wc with safety belt on, spouse present, and needs met.  Therapy Documentation Precautions:  Precautions Precautions: Fall Precaution Comments: L hemiparesis Restrictions Weight Bearing Restrictions: No Pain:  denies pain   Therapy/Group: Individual Therapy  Valma Cava 01/17/2019, 3:36 PM

## 2019-01-17 NOTE — Plan of Care (Signed)
  Problem: RH BOWEL ELIMINATION Goal: RH STG MANAGE BOWEL WITH ASSISTANCE Description: STG Manage Bowel with Mod I Assistance. Outcome: Progressing Goal: RH STG MANAGE BOWEL W/MEDICATION W/ASSISTANCE Description: STG Manage Bowel with Medication with Mod I Assistance. Outcome: Progressing   Problem: RH BLADDER ELIMINATION Goal: RH STG MANAGE BLADDER WITH ASSISTANCE Description: STG Manage Bladder With Mod I Assistance Outcome: Progressing   Problem: RH SAFETY Goal: RH STG ADHERE TO SAFETY PRECAUTIONS W/ASSISTANCE/DEVICE Description: STG Adhere to Safety Precautions With Mod I Assistance/Device. Outcome: Progressing   Problem: Consults Goal: RH STROKE PATIENT EDUCATION Description: Patient/family will be able to verbalize prevention of stroke, medications, and self care after discharge with cues/handouts Outcome: Progressing

## 2019-01-18 ENCOUNTER — Inpatient Hospital Stay (HOSPITAL_COMMUNITY): Payer: Medicare Other | Admitting: Physical Therapy

## 2019-01-18 ENCOUNTER — Inpatient Hospital Stay (HOSPITAL_COMMUNITY): Payer: Medicare Other

## 2019-01-18 ENCOUNTER — Inpatient Hospital Stay (HOSPITAL_COMMUNITY): Payer: Medicare Other | Admitting: Occupational Therapy

## 2019-01-18 LAB — GLUCOSE, CAPILLARY
Glucose-Capillary: 147 mg/dL — ABNORMAL HIGH (ref 70–99)
Glucose-Capillary: 216 mg/dL — ABNORMAL HIGH (ref 70–99)
Glucose-Capillary: 189 mg/dL — ABNORMAL HIGH (ref 70–99)
Glucose-Capillary: 227 mg/dL — ABNORMAL HIGH (ref 70–99)

## 2019-01-18 MED ORDER — INSULIN GLARGINE 100 UNIT/ML ~~LOC~~ SOLN
30.0000 [IU] | Freq: Every day | SUBCUTANEOUS | Status: DC
  Administered 2019-01-19 – 2019-01-21 (×3): 30 [IU] via SUBCUTANEOUS
  Filled 2019-01-18 (×3): qty 0.3

## 2019-01-18 NOTE — Patient Care Conference (Signed)
Inpatient RehabilitationTeam Conference and Plan of Care Update Date: 01/19/2019   Time: 11:15 AM    Patient Name: Danielle [name]      Medical Record Number: [id number]  Date of Birth: 06-Aug-1931 Sex: Female         Room/Bed: 4W12C/4W12C-01 Payor Info: Payor: [employer] MEDICARE / Plan: [facility] MEDICARE / Product Type: *No Product type* /    Admitting Diagnosis: 2. ABI Team  RT. MCA CVA; 17-19days   Admit Date/Time:  01/03/2019  5:35 PM Admission Comments: No comment available   Primary Diagnosis:  Right middle cerebral artery stroke [facility]) Principal Problem: Right middle cerebral artery stroke [facility])  Patient Active Problem List   Diagnosis Date Noted  . Right middle cerebral artery stroke ([place]) 01/03/2019  . Carotid stenosis, bilateral 12/30/2018  . Stroke ([place]) 12/24/2018  . Stroke-like symptoms 12/23/2018  . Generalized weakness 06/09/2018  . Hypothyroidism (acquired) 05/03/2018  . Hypertension 05/03/2018  . Uncontrolled type 2 diabetes mellitus with diabetic nephropathy, with long-term current use of insulin ([place])   . Back pain at L4-L5 level 06/14/2013  . Muscle weakness (generalized) 12/17/2012  . Pain in joint, shoulder region 12/17/2012  . Shoulder fracture 12/14/2012  . Proximal humerus fracture 11/29/2012  . FRACTURE, TOE, RIGHT 09/10/2009    Expected Discharge Date: Expected Discharge Date: 01/21/19  Team Members Present: Physician leading conference: Dr. Alger Simons Nurse Present: Other (comment)(Michelle Ralene Ok, RN) PT Present: Burnard Bunting, PT OT Present: Cherylynn Ridges, OT SLP Present: Weston Anna, SLP PPS Coordinator present : Ileana Ladd, PT     Current Status/Progress Goal Weekly Team Focus  Medical   dense left hemiparesis UE--without a lot of change. spica splint LUE. moving bowels and bladder  improve pain control and functional use of LUE  see progress note   Bowel/Bladder   Con of b/b lbm 01/16/19  con of b/b with min assist       Swallow/Nutrition/ Hydration             ADL's   CGA/min A overall  Supervision/min A  L NMR, NMES, self-care retraning, dc planning, pt/family education, balance, transfers   Mobility   min assist-CGA overall, gait up to 150' w/ RW, 4 steps w/ L rail  CGA, household gait  discharge planning, family education, consistency and carryover of technique for tasks, functional balance and fall risk education   Communication             Safety/Cognition/ Behavioral Observations            Pain   No c/o pain prn medication as needed  pain < 3 pain medications as needed      Skin   No skin issuses  Remain free from breakdown and infection         *See Care Plan and progress notes for long and short-term goals.     Barriers to Discharge  Current Status/Progress Possible Resolutions Date Resolved   Physician    Medical stability        see medical progress notes      Nursing                  PT                    OT                  SLP  SW                Discharge Planning/Teaching Needs:  Pt plans to return home with her husband and other family to assist as needed.  Family education is ongoing.   Team Discussion:  Pt is stable medically.  Her left hand and thumb are bothering her and may be arthritis or neuropathic pain.  Pt is trying a spica thumb splint and reports it is helping.  Pt is also using tape and voltaren gel.  Pt's blood sugars have been low and Dr. Naaman Plummer is adjusting medications.  Pt has no c/o pain, but c/o of left arm numbness.  Pt is continent x 2.  Pt is min A to CGA for OT tasks and therapists are doing lots of family education.  Pt is expected to meet supervision/CGA goals by Friday.  PT also doing family education and talking about home set up.  Pt and dtr interested in hiring caregivers for a couple of hours a day.  Pt may surpass some of her supervision/CGA goals.  Revisions to Treatment Plan:  none    Continued Need for Acute  Rehabilitation Level of Care: The patient requires daily medical management by a physician with specialized training in physical medicine and rehabilitation for the following conditions: Daily direction of a multidisciplinary physical rehabilitation program to ensure safe treatment while eliciting the highest outcome that is of practical value to the patient.: Yes Daily medical management of patient stability for increased activity during participation in an intensive rehabilitation regime.: Yes Daily analysis of laboratory values and/or radiology reports with any subsequent need for medication adjustment of medical intervention for : Neurological problems;Other   I attest that I was present, lead the team conference, and concur with the assessment and plan of the team.Team conference was held via web/ teleconference due to [place] - 19.   Danielle [name], Silvestre Mesi 01/19/2019, 9:35 AM

## 2019-01-18 NOTE — Progress Notes (Signed)
Physical Therapy Session Note  Patient Details  Name: Danielle [name] MRN: [id number] Date of Birth: 05-Feb-1932  Today's Date: 01/18/2019 PT Individual Time: [id number] PT Individual Time Calculation (min): 44 min   Short Term Goals: Week 2:  PT Short Term Goal 1 (Week 2): =LTGs due to ELOS  Skilled Therapeutic Interventions/Progress Updates:    Session focused on family education with pt and husband and discussed and answered questions in regards to equipment needs, home set-up, car transfers, and safety in the home/fall risk. Recommending real car transfer this PM for sedan and then if that is too difficult (as husband anticipates), bring the SUV tomorrow to practice that. PT recommends sedan over SUV but would like to practice SUV for real if possible. Discussed home entry again, and apparently the ramp that was installed is not built to code and too steep per husband report. Therefore, will need to continue to practice stair negotiation with rail and/or RW to increase confidence in patient. Husband denies concerns. Encouragement and reinforcement of home set-up modifications to increase safety and decrease fall risk with mobility. Pt and husband require reinforcement of all education provided for carryover.   Therapy Documentation Precautions:  Precautions Precautions: Fall Precaution Comments: L hemiparesis Restrictions Weight Bearing Restrictions: No Pain:  Denies pain.   Therapy/Group: Individual Therapy  Canary Brim Ivory Broad, PT, DPT, CBIS  01/18/2019, 12:28 PM

## 2019-01-18 NOTE — Progress Notes (Signed)
Physical Therapy Weekly Progress Note  Patient Details  Name: Danielle [name] MRN: [id number] Date of Birth: Dec 15, 1931  Beginning of progress report period: January 10, 2019 End of progress report period: January 18, 2019  Today's Date: 01/18/2019 PT Individual Time: 1400-1510 PT Individual Time Calculation (min): 70 min   Patient continues to make progress towards LTGs. D/c date extended to 9/4 to allow for more family/caregiver education and to develop increased consistency w/ all mobility. Pt at min assist-CGA overall, however pt's primary caregiver (husband) resistant to recommendations from therapists regarding safe mobility. Will continue to educate on pt's high risk of falls, importance of 24/7 hands-on assist, and recommendations for home set-up modifications.   Patient continues to demonstrate the following deficits muscle weakness, decreased cardiorespiratoy endurance, impaired timing and sequencing, decreased coordination and decreased motor planning, decreased midline orientation and decreased attention to left and decreased sitting balance, decreased standing balance, decreased postural control, hemiplegia and decreased balance strategies and therefore will continue to benefit from skilled PT intervention to increase functional independence with mobility.  Patient progressing toward long term goals..  Continue plan of care.  PT Short Term Goals Week 2:  PT Short Term Goal 1 (Week 2): =LTGs due to ELOS Week 3:  PT Short Term Goal 1 (Week 3): =LTGs due to ELOS  Skilled Therapeutic Interventions/Progress Updates:   Pt in w/c and agreeable to therapy, no c/o pain. Husband present and agreeable to practice real car transfer. Total assist w/c transport to/from outside of hospital. Husband performed hands-on car transfer to car w/ RW and back to w/c w/o RW to practice both techniques. Husband provided appropriate hands-on assist w/ gait belt, provided appropriate cues for safety such  as cueing pt to hold onto RW instead of car door for stability, and didn't need any verbal or physical intervening from this therapist. Ongoing education w/ husband regarding safety of using sedan instead of SUV, pt agrees and husband did eventually agree to use sedan for d/c and all other transportation. Returned to unit and practiced stairs w/ L rail x4 steps. Performed w/ CGA and verbal cues for technique/pattern, ascending sideways and then descending forwards. Practiced gait in controlled environment w/ CGA x75' and min assist in home environment x50'. Negotiated cones w/ frequent verbal and visual cues for L obstacle avoidance and RW management. Needed CGA-min assist to boost from w/c this session w/ cues for technique each time, emphasis on carryover of technique and sequencing w/ UE placement and LUE management on UE splint. Pt self-propelled w/c back to room, 150', w/ supervision via R hemi technique. Occasional cues for L obstacle avoidance. Pt needed min assist to correct multiple times. Ended session in w/c, all needs in reach.   Therapy Documentation Precautions:  Precautions Precautions: Fall Precaution Comments: L hemiparesis Restrictions Weight Bearing Restrictions: No   Therapy/Group: Individual Therapy  Leonardo Makris [name] 01/18/2019, 4:22 PM

## 2019-01-18 NOTE — Progress Notes (Signed)
  Patient ID: Danielle [name], female   DOB: 1931-11-19, 85 y.o.   MRN: [id number]      Diagnosis codes:  I69.354  Height:     5'5"           Weight:    132 lbs      Patient suffers from hemiplegia and hemiparesis following cerebral infarction affecting left, non-dominant side which impairs her ability to perform daily activities like bathing, dressing, grooming, toileting, and feeding in the home.  A walker or cane will not resolve issue with performing activities of daily living.  A wheelchair will allow patient to safely perform daily activities.  Patient is not able to propel herself in the home using a standard weight wheelchair due to arm weakness and endurance.  Patient can self propel in the lightweight wheelchair.

## 2019-01-18 NOTE — Progress Notes (Signed)
Physical Therapy Session Note  Patient Details  Name: Danielle [name] MRN: [id number] Date of Birth: [birth year]  Today's Date: 01/18/2019 PT Individual Time: 0800-0843 PT Individual Time Calculation (min): 43 min   Short Term Goals: Week 2:  PT Short Term Goal 1 (Week 2): =LTGs due to ELOS  Skilled Therapeutic Interventions/Progress Updates:    Pt discussing her desire for hired caregivers a few hours a day and states she plans to follow up with CSW today as well as more discussion with husband later when he arrives. Functional transfers today with CGA to min assist overall with RW with cues for hand placement and sequencing and physical assist for placement of LUE on the hand orthosis. Pt able to gait x 120' with CGA with RW (foot up brace and L hand orthosis) with cues for LLE clearance (decreased hip flexion activation and heel strike) with min assist during turning and as fatiged. NMR for balance, coordination, and LLE strengthening for toe taps to 4" block with cues to slow pace and focus on coordinated and controlled movement x 10 reps x 3 sets. CGA to min assist for functional stand step transfer with RW back to w/c with cues as described above. Mild LOB to L and required min assist to correct. Cues for sequencing of transfers still needed.   Therapy Documentation Precautions:  Precautions Precautions: Fall Precaution Comments: L hemiparesis Restrictions Weight Bearing Restrictions: No Pain: Pain Assessment Pain Scale: 0-10 Pain Score: 0-No pain    Therapy/Group: Individual Therapy  Canary Brim Ivory Broad, PT, DPT, CBIS  01/18/2019, 8:48 AM

## 2019-01-18 NOTE — Progress Notes (Signed)
Social Work Patient ID: Danielle [name], female   DOB: 1932/05/08, 85 y.o.   MRN: [id number]   CSW met with pt and her husband to update them on team conference discussion and to let them know that pt is on track for d/c on 01-21-19.  Pt is ready to go home, but also a little apprehensive.  Pt's husband had lots of questions and CSW tried to answer them.  CSW has asked that DME be delivered to pt's room on 01-20-19 so that husband can take home with SUV and bring sedan for pt on d/c day.  CSW has also arranged HH.  CSW will continue to follow and assist as needed.

## 2019-01-18 NOTE — Progress Notes (Signed)
Occupational Therapy Session Note  Patient Details  Name: Danielle [name] MRN: [id number] Date of Birth: June 29, 1931  Today's Date: 01/18/2019 OT Individual Time: 0930-1030 OT Individual Time Calculation (min): 60 min   Short Term Goals: Week 2:  OT Short Term Goal 1 (Week 2): LTG=STG 2/2 ELOS  Skilled Therapeutic Interventions/Progress Updates:    Pt greeted seated in wc and agreeable to OT Treatment session. Pt declined bathing/dressing today. OT removed Kinesoitape from thumb and applied voltaren gel to cmc joint for pain management. OT brought pt to the gym and she completed stand-pivot to therapy mat with CGA. Therapeutic activity with weight bearing through L elbow while reaching across body to get cups and then reach to stack them back on the R side. OT support at elbow and shoulder. OT re-applied kinestiotape for swelling in L hand. L UE NMR with weight bearing towel pushes.  Pt returned to room and left seated in wc with alarm belt on, husband present, and needs met.   Therapy Documentation Precautions:  Precautions Precautions: Fall Precaution Comments: L hemiparesis Restrictions Weight Bearing Restrictions: No Pain: Pain Assessment Pain Scale: 0-10 Pain Score: 0-No pain   Therapy/Group: Individual Therapy  Valma Cava 01/18/2019, 10:17 AM

## 2019-01-18 NOTE — Plan of Care (Signed)
  Problem: RH Balance Goal: LTG Patient will maintain dynamic standing with ADLs (OT) Description: LTG:  Patient will maintain dynamic standing balance with assist during activities of daily living (OT)  Flowsheets (Taken 01/18/2019 1050) LTG: Pt will maintain dynamic standing balance during ADLs with: Contact Guard/Touching assist Note: Goal downgraded 9/1-ESD   Problem: Sit to Stand Goal: LTG:  Patient will perform sit to stand in prep for activites of daily living with assistance level (OT) Description: LTG:  Patient will perform sit to stand in prep for activites of daily living with assistance level (OT) Flowsheets (Taken 01/18/2019 1050) LTG: PT will perform sit to stand in prep for activites of daily living with assistance level: Contact Guard/Touching assist Note: Goal downgraded 9/1-ESD   Problem: RH Dressing Goal: LTG Patient will perform upper body dressing (OT) Description: LTG Patient will perform upper body dressing with assist, with/without cues (OT). Flowsheets (Taken 01/18/2019 1050) LTG: Pt will perform upper body dressing with assistance level of: Contact Guard/Touching assist Note: Goal downgraded 9/1-ESD Goal: LTG Patient will perform lower body dressing w/assist (OT) Description: LTG: Patient will perform lower body dressing with assist, with/without cues in positioning using equipment (OT) Flowsheets (Taken 01/18/2019 1050) LTG: Pt will perform lower body dressing with assistance level of: Contact Guard/Touching assist Note: Goal downgraded 9/1-ESD   Problem: RH Toileting Goal: LTG Patient will perform toileting task (3/3 steps) with assistance level (OT) Description: LTG: Patient will perform toileting task (3/3 steps) with assistance level (OT)  Flowsheets (Taken 01/18/2019 1050) LTG: Pt will perform toileting task (3/3 steps) with assistance level: Contact Guard/Touching assist Note: Goal downgraded 9/1-ESD   Problem: RH Toilet Transfers Goal: LTG Patient will  perform toilet transfers w/assist (OT) Description: LTG: Patient will perform toilet transfers with assist, with/without cues using equipment (OT) Flowsheets (Taken 01/18/2019 1050) LTG: Pt will perform toilet transfers with assistance level of: Contact Guard/Touching assist Note: Goal downgraded 9/1-ESD   Problem: RH Tub/Shower Transfers Goal: LTG Patient will perform tub/shower transfers w/assist (OT) Description: LTG: Patient will perform tub/shower transfers with assist, with/without cues using equipment (OT) Flowsheets (Taken 01/18/2019 1050) LTG: Pt will perform tub/shower stall transfers with assistance level of: Contact Guard/Touching assist Note: Goal downgraded 9/1-ESD

## 2019-01-18 NOTE — Progress Notes (Signed)
[facility]IC[facility]IL[facility] PROGRESS NOTE   Subjective/Complaints:  No new issues. Feels that splint has helped had but it's still sore  ROS: Patient denies fever, rash, sore throat, blurred vision, nausea, vomiting, diarrhea, cough, shortness of breath or chest pain, joint or back pain, headache, or mood change.    Objective:   No results found. No results for input(s): WBC, HGB, HCT, PLT in the last 72 hours. No results for input(s): NA, K, CL, CO2, GLUCOSE, BUN, CREATININE, CALCIUM in the last 72 hours.  Intake/Output Summary (Last 24 hours) at 01/18/2019 0904 Last data filed at 01/18/2019 0749 Gross per 24 hour  Intake 798 ml  Output -  Net 798 ml     Physical Exam: Vital Signs Blood pressure (!) 117/42, pulse 70, temperature 98.4 F (36.9 C), resp. rate 17, height 5\' 4"  (1.626 m), weight 59.2 kg, SpO2 95 %.  Constitutional: No distress . Vital signs reviewed. HEENT: EOMI, oral membranes moist Neck: supple Cardiovascular: RRR without murmur. No JVD    Respiratory: CTA Bilaterally without wheezes or rales. Normal effort    GI: BS +, non-tender, non-distended  Musculoskeletal:Normal range of motion. no shoulder pain. Left thumb tender at MCP jt---has spica splint with k tape   Neurological: She isalert. And oriented x 3. Left C7. RUE and RLE 4/5. LUE trace to  0/5--stable.  LLE remains 3+/5 prox to distal--stable exam.  Normal pain and LT in all 4's--no changes today Skin: Skin iswarmand dry. She isnot diaphoretic. Noerythema.  Psychiatric:pleasant   Assessment/Plan: 1. Functional deficits secondary to right MCA infarct which require 3+ hours per day of interdisciplinary therapy in a comprehensive inpatient rehab setting.  Physiatrist is providing close team supervision and 24 hour management of active medical problems listed below.  Physiatrist and rehab team continue to assess barriers to discharge/monitor patient progress toward functional  and medical goals  Care Tool:  Bathing    Body parts bathed by patient: Left arm, Abdomen, Chest, Front perineal area, Face, Right upper leg, Left upper leg, Right lower leg, Left lower leg, Buttocks   Body parts bathed by helper: Right arm     Bathing assist Assist Level: Minimal Assistance - Patient > 75%     Upper Body Dressing/Undressing Upper body dressing   What is the patient wearing?: Button up shirt    Upper body assist Assist Level: Moderate Assistance - Patient 50 - 74%    Lower Body Dressing/Undressing Lower body dressing      What is the patient wearing?: Underwear/pull up, Pants     Lower body assist Assist for lower body dressing: Moderate Assistance - Patient 50 - 74%     Toileting Toileting    Toileting assist Assist for toileting: Moderate Assistance - Patient 50 - 74%     Transfers Chair/bed transfer  Transfers assist     Chair/bed transfer assist level: Contact Guard/Touching assist     Locomotion Ambulation   Ambulation assist      Assist level: Minimal Assistance - Patient > 75% Assistive device: Walker-rolling Max distance: 120'   Walk 10 feet activity   Assist     Assist level: Contact Guard/Touching assist Assistive device: Walker-rolling, Orthosis   Walk 50 feet activity   Assist Walk 50 feet with 2 turns activity did not occur: Safety/medical concerns  Assist level: Minimal Assistance - Patient > 75% Assistive device: Walker-rolling, Orthosis    Walk 150 feet activity   Assist Walk 150 feet activity did  not occur: Safety/medical concerns  Assist level: Moderate Assistance - Patient - 50 - 74% Assistive device: Walker-rolling    Walk 10 feet on uneven surface  activity   Assist Walk 10 feet on uneven surfaces activity did not occur: Safety/medical concerns         Wheelchair     Assist Will patient use wheelchair at discharge?: Yes Type of Wheelchair: Manual Wheelchair activity did not  occur: N/A  Wheelchair assist level: Supervision/Verbal cueing Max wheelchair distance: 32'    Wheelchair 50 feet with 2 turns activity    Assist    Wheelchair 50 feet with 2 turns activity did not occur: N/A   Assist Level: Supervision/Verbal cueing   Wheelchair 150 feet activity     Assist  Wheelchair 150 feet activity did not occur: N/A       Blood pressure (!) 117/42, pulse 70, temperature 98.4 F (36.9 C), resp. rate 17, height 5\' 4"  (1.626 m), weight 59.2 kg, SpO2 95 %.  Medical Problem List and Plan: 1.Left-sided weaknesssecondary to recurrent right MCA infarct in setting of right ICA occlusion and severe left ICA stenosis. -team conference today   -left WHO at HS, AFO LLE per [facility]    -continue supportive measures LUE 2. Antithrombotics: -DVT/anticoagulation:Lovenox -antiplatelet therapy: Aspirin 325 mg daily and Plavix 75 mg daily 3. Pain Management:Tylenol as needed  -ordered low profile neoprene thumb spica LUE per [facility]--appears to be fitting and providing some support  - voltaren gel QID to left hand/thumb with some relief   -remove k-tape from thumb 4. Mood:Provide emotional support -antipsychotic agents: N/A 5. Neuropsych: This patientiscapable of making decisions on herown behalf. 6. Skin/Wound Care:Routine skin checks 7. Fluids/Electrolytes/Nutrition: encourage PO  -intake reasonable 8. Severe left internalcarotid stenosis. Follow-up outpatient vascular surgery Dr. Carlis Abbott to consider intervention 9. Hypertension. Norvasc 5 mg daily. Controlled at present 10. Diabetes mellitus. Hemoglobin A1c 10.3.  -sugars labile  -AM CBG high yesterday (217) and a little better today (147)  -continue AM lantus  26u--increase to 30u effective 9/2 (already has received this morning)   -  Dc'ed PM lantus 8/30  -pt is on heart healthy cm diet   CBG (last 3)  Recent Labs    01/17/19 1627 01/17/19 2122  01/18/19 0622  GLUCAP 221* 213* 147*   11. Hyperlipidemia. Lipitor 12. Hypothyroidism. Continue Synthroid 13. Constipation: Needs a bm  Moved bowels 8/30    LOS: 15 days A FACE TO [place] 01/18/2019, 9:04 AM

## 2019-01-19 ENCOUNTER — Inpatient Hospital Stay (HOSPITAL_COMMUNITY): Payer: Medicare Other

## 2019-01-19 ENCOUNTER — Inpatient Hospital Stay (HOSPITAL_COMMUNITY): Payer: Medicare Other | Admitting: Physical Therapy

## 2019-01-19 LAB — GLUCOSE, CAPILLARY
Glucose-Capillary: 123 mg/dL — ABNORMAL HIGH (ref 70–99)
Glucose-Capillary: 290 mg/dL — ABNORMAL HIGH (ref 70–99)
Glucose-Capillary: 148 mg/dL — ABNORMAL HIGH (ref 70–99)
Glucose-Capillary: 189 mg/dL — ABNORMAL HIGH (ref 70–99)

## 2019-01-19 NOTE — Discharge Summary (Signed)
Physician Discharge Summary  Patient ID: DAIA HEBNER MRN: [id number] DOB/AGE: June 19, 1931 85 y.o.  Admit date: 01/03/2019 Discharge date: 01/21/2019  Discharge Diagnoses:  Principal Problem:   Right middle cerebral artery stroke [facility]) Active Problems:   Uncontrolled type 2 diabetes mellitus with diabetic nephropathy, with long-term current use of insulin ([place])   Hypertension   Carotid stenosis, bilateral DVT prophylaxis Severe left internal carotid artery stenosis Hyperlipidemia Hypothyroidism Discharged Condition: stable  Significant Diagnostic Studies: Ct Angio Head W Or Wo Contrast  Addendum Date: 12/29/2018   ADDENDUM REPORT: 12/29/2018 23:52 ADDENDUM: Study discussed by telephone with Dr. Jani Gravel on 12/29/2018 at 2337 hours. Electronically Signed   By: Genevie Ann M.D.   On: 12/29/2018 23:52   Result Date: 12/29/2018 CLINICAL DATA:  85 year old female with acute, subacute, and chronic right MCA territory infarcts. 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 EXAM: CT ANGIOGRAPHY HEAD AND NECK TECHNIQUE: Multidetector CT imaging of the head and neck was performed using the standard protocol during bolus administration of intravenous contrast. Multiplanar CT image reconstructions and MIPs were obtained to evaluate the vascular anatomy. Carotid stenosis measurements (when applicable) are obtained utilizing NASCET criteria, using the distal internal carotid diameter as the denominator. CONTRAST:  171mL OMNIPAQUE IOHEXOL 350 MG/ML SOLN COMPARISON:  Brain MRI 1305 hours today and earlier. Neck MRA 05/03/2018. FINDINGS: CT HEAD Brain: Patchy and confluent hypodensity in the right MCA territory corresponding to the various ischemia. No associated hemorrhage. No associated mass effect. No ventriculomegaly. Stable gray-white matter differentiation elsewhere since 12/23/2018. Left anterior paraspinal seen meningioma measuring about 2.7 centimeters diameter redemonstrated on series 5,  image 9. Calvarium and skull base: No acute osseous abnormality identified. Paranasal sinuses: Visualized paranasal sinuses and mastoids are stable and well pneumatized. Orbits: No acute orbit or scalp soft tissue findings. CTA NECK Skeleton: Severe cervical spine degeneration superimposed on congenital appearing incomplete segmentation of C5-C6. Partially visible thoracic scoliosis and right shoulder arthroplasty. No acute osseous abnormality identified. Upper chest: Negative. Other neck: Negative. Aortic arch: Mild to moderate arch atherosclerosis. Three vessel arch configuration. Right carotid system: No brachiocephalic or right CCA origin stenosis. Mild soft and calcified plaque in the right CCA proximal to the bifurcation. There is confluent calcification of the right ICA origin resulting in near occlusion of the vessel, string sign type reconstitution occurs at the bulb (series 13, image 65), and the vessel is patent but highly diminutive and thread-like to the skull base. See series 9, image 65. Left carotid system: No left CCA origin stenosis and minimal plaque proximal to the left carotid bifurcation. The left ICA origin is also heavily calcified with string sign stenosis. The distal bulb is diminutive, only 2-3 millimeters diameter. However, the remaining cervical left ICA has a more normal caliber and is patent to the skull base without additional stenosis. Vertebral arteries: No proximal right subclavian artery stenosis despite plaque. The right vertebral artery is non dominant. Calcified plaque near the right vertebral origin results in no significant stenosis. The right vertebral is patent to the skull base without stenosis. No proximal left subclavian artery stenosis despite soft and calcified plaque. Dominant left vertebral artery with a normal origin. The left vertebral is patent to the skull base without stenosis. CTA HEAD Posterior circulation: Mildly dominant left V4 segment. No distal vertebral  stenosis. Normal right PICA origin. The left AICA appears dominant and patent. Patent vertebrobasilar junction and basilar artery without stenosis. Patent AICA and SCA origins. Patent right PCA origin  with mild irregularity but no significant stenosis. There is a fetal type left PCA. The right posterior communicating artery is diminutive or absent. Left PCA branches are within normal limits. There is additional moderate stenosis in the right PCA P2 segment, but the distal right PCA is within normal limits. Anterior circulation: Thread-like enhancement of the right ICA at the skull base is noted but the right siphon is occluded in the distal petrous segment. Enhancement is reconstituted at the distal cavernous segment and the supraclinoid right ICA is patent although with asymmetrically decreased enhancement. The right ICA terminus is patent. The right A1 and M1 are patent but diminutive (series 15, image 16). The right MCA bifurcation is patent. The right MCA branches are patent but diminutive (series 14, image 19). The left ICA siphon is patent without stenosis despite some calcified plaque. Normal left posterior communicating artery origin. Normal left ICA terminus, left ACA origin. An anterior communicating artery is identified, although the left ACA remains larger than the right throughout. The ACA branches are within normal limits except for some mass effect related to the para falcine meningioma. The left MCA origin is normal with mild irregularity and stenosis in the left M1. The left MCA bifurcation is patent without stenosis. Left MCA branches are within normal limits. Venous sinuses: Patent. Anatomic variants: Dominant left vertebral artery. Review of the MIP images confirms the above findings IMPRESSION: 1. 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 - the Right ICA is thread-like distal to the severe stenosis, and occludes in the mid right  siphon. - there is suboptimal reconstitution of the distal right ICA (see #2) and the right MCA and right ACA are patent but diminutive. - the left ICA maintains a more normal caliber distal to the severe stenosis. And the left anterior circulation is largely normal. 2. There is no vertebral or basilar artery stenosis, but the Right Pcomm is diminutive or absent. There is up to moderate right PCA stenosis. 3. Expected CT appearance of right MCA ischemia. No intracranial hemorrhage or mass effect. 4. Chronic left para falcine meningioma. 5. 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 Electronically Signed: By: Genevie Ann M.D. On: 12/29/2018 23:29   Ct Head Wo Contrast  Result Date: 12/23/2018 CLINICAL DATA:  Left upper extremity weakness EXAM: CT HEAD WITHOUT CONTRAST TECHNIQUE: Contiguous axial images were obtained from the base of the skull through the vertex without intravenous contrast. COMPARISON:  09/27/2018 FINDINGS: Brain: Redemonstration of a mass arising from the anterior falx on the left measuring approximately 2.6 x 2.8 cm, stable in size compared to prior. Internal focus of low-density likely internal cystic change, also stable from prior. Mass results in mild mass effect on the anterior horn of the left lateral ventricle. No appreciable surrounding edema. Encephalomalacia in the right frontal lobe from prior right MCA distribution infarct. Additional areas of encephalomalacia in the right basal ganglia from prior infarcts, also unchanged. No evidence of new infarct, hemorrhage, or extra-axial fluid collection. Scattered low-density changes within the periventricular and subcortical white matter compatible with chronic microvascular ischemic change. Ventricular size and configuration is unchanged. Vascular: Atherosclerotic calcifications involving the large vessels of the skull base. No unexpected hyperdense vessel. Skull: Normal. Negative for  fracture or focal lesion. Sinuses/Orbits: No acute finding. Other: None. IMPRESSION: 1. No evidence of an acute intracranial abnormality. 2. Stable size of left anterior parafalcine meningioma. 3. Chronic microvascular ischemic changes as well as prior right  MCA distribution infarct and prior right basal ganglia lacunar infarcts. Electronically Signed   By: Davina Poke M.D.   On: 12/23/2018 12:02   Ct Angio Neck W Or Wo Contrast  Addendum Date: 12/29/2018   ADDENDUM REPORT: 12/29/2018 23:52 ADDENDUM: Study discussed by telephone with Dr. Jani Gravel on 12/29/2018 at 2337 hours. Electronically Signed   By: Genevie Ann M.D.   On: 12/29/2018 23:52   Result Date: 12/29/2018 CLINICAL DATA:  85 year old female with acute, subacute, and chronic right MCA territory infarcts. 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 EXAM: CT ANGIOGRAPHY HEAD AND NECK TECHNIQUE: Multidetector CT imaging of the head and neck was performed using the standard protocol during bolus administration of intravenous contrast. Multiplanar CT image reconstructions and MIPs were obtained to evaluate the vascular anatomy. Carotid stenosis measurements (when applicable) are obtained utilizing NASCET criteria, using the distal internal carotid diameter as the denominator. CONTRAST:  139mL OMNIPAQUE IOHEXOL 350 MG/ML SOLN COMPARISON:  Brain MRI 1305 hours today and earlier. Neck MRA 05/03/2018. FINDINGS: CT HEAD Brain: Patchy and confluent hypodensity in the right MCA territory corresponding to the various ischemia. No associated hemorrhage. No associated mass effect. No ventriculomegaly. Stable gray-white matter differentiation elsewhere since 12/23/2018. Left anterior paraspinal seen meningioma measuring about 2.7 centimeters diameter redemonstrated on series 5, image 9. Calvarium and skull base: No acute osseous abnormality identified. Paranasal sinuses: Visualized paranasal sinuses and mastoids are stable and well pneumatized. Orbits: No  acute orbit or scalp soft tissue findings. CTA NECK Skeleton: Severe cervical spine degeneration superimposed on congenital appearing incomplete segmentation of C5-C6. Partially visible thoracic scoliosis and right shoulder arthroplasty. No acute osseous abnormality identified. Upper chest: Negative. Other neck: Negative. Aortic arch: Mild to moderate arch atherosclerosis. Three vessel arch configuration. Right carotid system: No brachiocephalic or right CCA origin stenosis. Mild soft and calcified plaque in the right CCA proximal to the bifurcation. There is confluent calcification of the right ICA origin resulting in near occlusion of the vessel, string sign type reconstitution occurs at the bulb (series 13, image 65), and the vessel is patent but highly diminutive and thread-like to the skull base. See series 9, image 65. Left carotid system: No left CCA origin stenosis and minimal plaque proximal to the left carotid bifurcation. The left ICA origin is also heavily calcified with string sign stenosis. The distal bulb is diminutive, only 2-3 millimeters diameter. However, the remaining cervical left ICA has a more normal caliber and is patent to the skull base without additional stenosis. Vertebral arteries: No proximal right subclavian artery stenosis despite plaque. The right vertebral artery is non dominant. Calcified plaque near the right vertebral origin results in no significant stenosis. The right vertebral is patent to the skull base without stenosis. No proximal left subclavian artery stenosis despite soft and calcified plaque. Dominant left vertebral artery with a normal origin. The left vertebral is patent to the skull base without stenosis. CTA HEAD Posterior circulation: Mildly dominant left V4 segment. No distal vertebral stenosis. Normal right PICA origin. The left AICA appears dominant and patent. Patent vertebrobasilar junction and basilar artery without stenosis. Patent AICA and SCA origins.  Patent right PCA origin with mild irregularity but no significant stenosis. There is a fetal type left PCA. The right posterior communicating artery is diminutive or absent. Left PCA branches are within normal limits. There is additional moderate stenosis in the right PCA P2 segment, but the distal right PCA is within normal limits. Anterior circulation: Thread-like enhancement  of the right ICA at the skull base is noted but the right siphon is occluded in the distal petrous segment. Enhancement is reconstituted at the distal cavernous segment and the supraclinoid right ICA is patent although with asymmetrically decreased enhancement. The right ICA terminus is patent. The right A1 and M1 are patent but diminutive (series 15, image 16). The right MCA bifurcation is patent. The right MCA branches are patent but diminutive (series 14, image 19). The left ICA siphon is patent without stenosis despite some calcified plaque. Normal left posterior communicating artery origin. Normal left ICA terminus, left ACA origin. An anterior communicating artery is identified, although the left ACA remains larger than the right throughout. The ACA branches are within normal limits except for some mass effect related to the para falcine meningioma. The left MCA origin is normal with mild irregularity and stenosis in the left M1. The left MCA bifurcation is patent without stenosis. Left MCA branches are within normal limits. Venous sinuses: Patent. Anatomic variants: Dominant left vertebral artery. Review of the MIP images confirms the above findings IMPRESSION: 1. 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 - the Right ICA is thread-like distal to the severe stenosis, and occludes in the mid right siphon. - there is suboptimal reconstitution of the distal right ICA (see #2) and the right MCA and right ACA are patent but diminutive. - the left ICA maintains a more normal  caliber distal to the severe stenosis. And the left anterior circulation is largely normal. 2. There is no vertebral or basilar artery stenosis, but the Right Pcomm is diminutive or absent. There is up to moderate right PCA stenosis. 3. Expected CT appearance of right MCA ischemia. No intracranial hemorrhage or mass effect. 4. Chronic left para falcine meningioma. 5. 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 Electronically Signed: By: Genevie Ann M.D. On: 12/29/2018 23:29   Mr Angio Head Wo Contrast  Result Date: 12/23/2018 CLINICAL DATA:  Left arm weakness. EXAM: MRI HEAD WITHOUT CONTRAST MRA HEAD WITHOUT CONTRAST TECHNIQUE: Multiplanar, multiecho pulse sequences of the brain and surrounding structures were obtained without intravenous contrast. Angiographic images of the head were obtained using MRA technique without contrast. COMPARISON:  Head CT 12/23/2018.  Head MRI and MRA 05/03/2018. FINDINGS: MRI HEAD FINDINGS Brain: There are multiple small foci of restricted diffusion in the right MCA territory consistent with acute infarcts primarily involving cortex of the right frontal and parietal lobes. A chronic right MCA infarct is again noted involving the insula, basal ganglia, and frontal lobe with ex vacuo dilatation of the right frontal horn and hemosiderin staining. Scattered T2 hyperintensities elsewhere in the cerebral white matter bilaterally are similar to the prior MRI and nonspecific but compatible with mild chronic small vessel ischemic disease. An extra-axial mass anteriorly along the falx on the left has not significantly changed in size from the prior MRI and measures 2.2 x 2.8 x 3.7 cm. There is local mass effect on the left frontal lobe without edema. Mild generalized cerebral atrophy is not greater than expected for age. Vascular: Chronic right ICA occlusion. Skull and upper cervical spine: Unremarkable bone marrow signal. Sinuses/Orbits:  Bilateral cataract extraction. Paranasal sinuses and mastoid air cells are clear. Other: None. MRA HEAD FINDINGS The visualized distal vertebral arteries are patent to the basilar with the left being dominant. The basilar artery is patent with minimal narrowing distally. There is a large left posterior communicating artery. Severe bilateral  P2 stenoses are again noted. There is chronic occlusion of the right ICA with distal reconstitution near the terminus. The right M1 and proximal M2 segments are poorly visualized, more so than on the prior MRA and suggesting severely reduced flow. There is some reconstitution of more robust flow related enhancement in the more distal right MCA branches. No significant flow related enhancement is apparent today in the right A1 segment which may reflect further reduced flow or interval occlusion. The right A2 segment is patent and supplied by the left ACA via the anterior communicating artery. The left intracranial ICA is widely patent. The left ACA and left MCA are patent without evidence of proximal branch occlusion or significant proximal stenosis. No aneurysm is identified. IMPRESSION: 1. Multiple small acute right MCA infarcts. 2. Chronic right MCA infarct. 3. 3.7 cm left anterior parafalcine meningioma, not significantly changed in size from 2019 and without edema. 4. 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 Severely diminished flow in the proximal right MCA and possible interval occlusion of the right A1 segment. Electronically Signed   By: Logan Bores M.D.   On: 12/23/2018 14:57   Mr Brain Wo Contrast  Result Date: 12/29/2018 CLINICAL DATA:  Leg weakness with difficulty walking. Recent stroke. EXAM: MRI HEAD WITHOUT CONTRAST TECHNIQUE: Multiplanar, multiecho pulse sequences of the brain and surrounding structures were obtained without intravenous contrast. COMPARISON:  12/23/2018 FINDINGS: Brain: Many of the small acute right MCA  infarcts on the recent prior MRI demonstrate diminished diffusion signal abnormality. However, there are multiple new acute right MCA infarcts involving frontal and parietal cortex which are overall larger than those on the prior study including a 3 cm infarct in the right precentral gyrus in the hand motor region which extends inferiorly into the centrum semiovale. There is no associated hemorrhage. A chronic right MCA infarct is again noted involving the insula, basal ganglia, and frontal lobe with hemosiderin staining and ex vacuo dilatation of the right frontal horn. An extra-axial left frontal mass anteriorly along the falx is unchanged, measuring 2.1 x 2.8 x 3.6 cm with local mass effect but no significant edema. There is no midline shift or extra-axial fluid collection. Mild generalized cerebral atrophy is not greater than expected for age. Vascular: Chronic right ICA occlusion. Skull and upper cervical spine: Unremarkable bone marrow signal. Sinuses/Orbits: Bilateral cataract extraction. Paranasal sinuses and mastoid air cells are clear. Other: None. IMPRESSION: 1. Multiple new acute right MCA infarcts involving the frontal and parietal lobes. 2. Subacute and chronic infarcts as above. 3. Unchanged left para falcine meningioma. 4. Chronic occlusion of the right ICA. Electronically Signed   By: Logan Bores M.D.   On: 12/29/2018 17:00   Mr Brain Wo Contrast  Result Date: 12/23/2018 CLINICAL DATA:  Left arm weakness. EXAM: MRI HEAD WITHOUT CONTRAST MRA HEAD WITHOUT CONTRAST TECHNIQUE: Multiplanar, multiecho pulse sequences of the brain and surrounding structures were obtained without intravenous contrast. Angiographic images of the head were obtained using MRA technique without contrast. COMPARISON:  Head CT 12/23/2018.  Head MRI and MRA 05/03/2018. FINDINGS: MRI HEAD FINDINGS Brain: There are multiple small foci of restricted diffusion in the right MCA territory consistent with acute infarcts primarily  involving cortex of the right frontal and parietal lobes. A chronic right MCA infarct is again noted involving the insula, basal ganglia, and frontal lobe with ex vacuo dilatation of the right frontal horn and hemosiderin staining. Scattered T2 hyperintensities elsewhere in the cerebral white matter  bilaterally are similar to the prior MRI and nonspecific but compatible with mild chronic small vessel ischemic disease. An extra-axial mass anteriorly along the falx on the left has not significantly changed in size from the prior MRI and measures 2.2 x 2.8 x 3.7 cm. There is local mass effect on the left frontal lobe without edema. Mild generalized cerebral atrophy is not greater than expected for age. Vascular: Chronic right ICA occlusion. Skull and upper cervical spine: Unremarkable bone marrow signal. Sinuses/Orbits: Bilateral cataract extraction. Paranasal sinuses and mastoid air cells are clear. Other: None. MRA HEAD FINDINGS The visualized distal vertebral arteries are patent to the basilar with the left being dominant. The basilar artery is patent with minimal narrowing distally. There is a large left posterior communicating artery. Severe bilateral P2 stenoses are again noted. There is chronic occlusion of the right ICA with distal reconstitution near the terminus. The right M1 and proximal M2 segments are poorly visualized, more so than on the prior MRA and suggesting severely reduced flow. There is some reconstitution of more robust flow related enhancement in the more distal right MCA branches. No significant flow related enhancement is apparent today in the right A1 segment which may reflect further reduced flow or interval occlusion. The right A2 segment is patent and supplied by the left ACA via the anterior communicating artery. The left intracranial ICA is widely patent. The left ACA and left MCA are patent without evidence of proximal branch occlusion or significant proximal stenosis. No aneurysm is  identified. IMPRESSION: 1. Multiple small acute right MCA infarcts. 2. Chronic right MCA infarct. 3. 3.7 cm left anterior parafalcine meningioma, not significantly changed in size from 2019 and without edema. 4. 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 Severely diminished flow in the proximal right MCA and possible interval occlusion of the right A1 segment. Electronically Signed   By: Logan Bores M.D.   On: 12/23/2018 14:57   Mr Cervical Spine W Wo Contrast  Result Date: 12/29/2018 CLINICAL DATA:  Left upper extremity weakness. EXAM: MRI CERVICAL SPINE WITHOUT AND WITH CONTRAST TECHNIQUE: Multiplanar and multiecho pulse sequences of the cervical spine, to include the craniocervical junction and cervicothoracic junction, were obtained without and with intravenous contrast. CONTRAST:  6 cc Gadavist COMPARISON:  Cervical spine CT scan from 02/05/2017 FINDINGS: Alignment: Severe degenerative cervical spondylosis with multilevel disc disease and facet disease. Multilevel degenerative subluxations are noted. 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 Vertebrae: No bone lesions or fractures. Cord: Normal MR appearance of the cervical spinal cord. No cord lesions or syrinx. No areas of abnormal cord enhancement. Posterior Fossa, vertebral arteries, paraspinal tissues: No significant findings. Disc levels: C2-3: No significant findings. C3-4: Degenerative disc disease and facet disease with a bulging annulus and osteophytic ridging. No significant spinal stenosis. Left-sided disc osteophyte complex with mild left foraminal stenosis. C4-5: Advanced degenerative disc disease with a bulging degenerated annulus, osteophytic ridging and uncinate spurring. There is flattening of the ventral thecal sac and narrowing the ventral CSF space. No significant foraminal stenosis. C5-6: Klippel-Feil anomaly with partial fusion. No spinal or foraminal stenosis. C6-7: Advanced degenerative disc disease with  a bulging degenerated annulus, osteophytic ridging and uncinate spurring. There is flattening of the ventral thecal sac and near obliteration of the ventral CSF space. Mild foraminal narrowing bilaterally due to uncinate spurring. C7-T1: Bulging annulus and facet disease but no disc protrusions or spinal stenosis. Mild bilateral foraminal stenosis. IMPRESSION: 1. Advanced degenerative cervical spondylosis with multilevel  disc disease and facet disease. 2. 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 3. Normal MR appearance of the cervical spinal cord. 4. Mild spinal and bilateral foraminal stenosis at C6-7. 5. Mild left foraminal stenosis at C3-4. 6. Mild bilateral foraminal stenosis at C7-T1. Electronically Signed   By: Marijo Sanes M.D.   On: 12/29/2018 16:51   US Carotid Bilateral (at [facility] Only)  Result Date: 12/23/2018 CLINICAL DATA:  Small acute right MCA territory infarcts. History of chronic right ICA occlusion EXAM: BILATERAL CAROTID DUPLEX ULTRASOUND TECHNIQUE: Pearline Cables scale imaging, color Doppler and duplex ultrasound were performed of bilateral carotid and vertebral arteries in the neck. COMPARISON:  12/23/2018 MRI FINDINGS: Criteria: Quantification of carotid stenosis is based on velocity parameters that correlate the residual internal carotid diameter with NASCET-based stenosis levels, using the diameter of the distal internal carotid lumen as the denominator for stenosis measurement. The following velocity measurements were obtained: RIGHT ICA: Occluded cm/sec CCA: 33/4 cm/sec ECA: 283 cm/sec LEFT ICA: 174/44 cm/sec CCA: [id number] cm/sec SYSTOLIC ICA/CCA RATIO:  3.7 ECA: 114 cm/sec RIGHT CAROTID ARTERY: Extensive right carotid atherosclerosis. CCA and ECA remain patent. Chronic occlusion of the right ICA as before. RIGHT VERTEBRAL ARTERY:  Antegrade LEFT CAROTID ARTERY: Moderate heterogeneous calcified atherosclerosis. Slight velocity elevation measuring 174/44 centimeters/second. Mild turbulent flow and spectral  broadening. Left ICA stenosis estimated at 50-69% by ultrasound criteria. LEFT VERTEBRAL ARTERY:  Antegrade IMPRESSION: Chronically occluded right ICA as before. Moderate left ICA atherosclerosis. Left ICA stenosis estimated at 50-69% Patent antegrade vertebral flow bilaterally Electronically Signed   By: Jerilynn Mages.  Shick M.D.   On: 12/23/2018 15:28   Vas US Carotid  Result Date: 12/30/2018 Carotid Arterial Duplex Study Indications:       Bilateral ICA stenosis / string sign seen on CTA neck. Risk Factors:      Hypertension, hyperlipidemia, Diabetes. Comparison Study:  Carotid artery duplex 12/23/2018, CTA head and neck 12/29/2018. Performing Technologist: Maudry Mayhew MHA, RDMS, RVT, RDCS  Examination Guidelines: A complete evaluation includes B-mode imaging, spectral Doppler, color Doppler, and power Doppler as needed of all accessible portions of each vessel. Bilateral testing is considered an integral part of a complete examination. Limited examinations for reoccurring indications may be performed as noted.  Right Carotid Findings: +----------+--------+-------+--------+------------------------+----------------+           PSV cm/sEDV    StenosisDescribe                Comments                           cm/s                                                    +----------+--------+-------+--------+------------------------+----------------+ CCA Prox  28                     smooth and heterogenous                  +----------+--------+-------+--------+------------------------+----------------+ CCA Distal30      4              heterogenous and  calcific                                 +----------+--------+-------+--------+------------------------+----------------+ ICA Prox  130     14             calcific                String sign      +----------+--------+-------+--------+------------------------+----------------+ ICA  Distal14                                             Thumped waveform +----------+--------+-------+--------+------------------------+----------------+ ECA       380     24                                                      +----------+--------+-------+--------+------------------------+----------------+ +----------+--------+-------+----------------+-------------------+           PSV cm/sEDV cmsDescribe        Arm Pressure (mmHG) +----------+--------+-------+----------------+-------------------+ [id number]             Multiphasic, WNL                    +----------+--------+-------+----------------+-------------------+ +---------+--------+--+--------+--+---------+ VertebralPSV cm/s85EDV cm/s11Antegrade +---------+--------+--+--------+--+---------+  Left Carotid Findings: +----------+--------+--------+--------+-----------------------+--------+           PSV cm/sEDV cm/sStenosisDescribe               Comments +----------+--------+--------+--------+-----------------------+--------+ CCA Prox  73      13                                              +----------+--------+--------+--------+-----------------------+--------+ CCA Distal69      12              smooth and heterogenous         +----------+--------+--------+--------+-----------------------+--------+ ICA Prox  263     56              calcific                        +----------+--------+--------+--------+-----------------------+--------+ ICA Distal145     29                                              +----------+--------+--------+--------+-----------------------+--------+ ECA       172                     calcific                        +----------+--------+--------+--------+-----------------------+--------+ +----------+--------+--------+----------------+-------------------+ SubclavianPSV cm/sEDV cm/sDescribe        Arm Pressure (mmHG)  +----------+--------+--------+----------------+-------------------+           171             Multiphasic, WNL                    +----------+--------+--------+----------------+-------------------+ +---------+--------+--+--------+-+---------+ VertebralPSV  cm/s47EDV cm/s9Antegrade +---------+--------+--+--------+-+---------+  Summary: Right Carotid: Sonographic string sign noted at origin of right internal carotid                artery, with thumped waveform of distal ICA, suggestive of distal                occlusion. This is consistent with CTA head and neck findings. Left Carotid: Elevated peak systolic velocities are suggestive of 60-79%               stenosis, however end diastolic velocities suggest upper range               40-59% stenosis. However, posterior acoustic shadowing secondary               to calcific plaque at bulb may obscure higher velocities. Vertebrals:  Bilateral vertebral arteries demonstrate antegrade flow. Subclavians: Normal flow hemodynamics were seen in bilateral subclavian              arteries. *See table(s) above for measurements and observations.  Electronically signed by Monica Martinez MD on 12/30/2018 at 3:29:37 PM.    Final     Labs:  Basic Metabolic Panel: No results for input(s): NA, K, CL, CO2, GLUCOSE, BUN, CREATININE, CALCIUM, MG, PHOS in the last 168 hours.  CBC: No results for input(s): WBC, NEUTROABS, HGB, HCT, MCV, PLT in the last 168 hours.  CBG: Recent Labs  Lab 01/19/19 2051 01/20/19 0635 01/20/19 1148 01/20/19 1645 01/20/19 2108  GLUCAP 189* 159* 297* 112* 213*   Family history.  Mother and father with hypertension prediabetes and hyperlipidemia.  Denies colon cancer  Brief HPI:   Danielle [name] is a 85 y.o. right-handed female with history of hypertension, hyperlipidemia, diabetes mellitus, CVA 05/03/2018 maintained on aspirin as well as left anterior parafalcine meningioma.  She quit smoking 29 years ago she is also had a  right shoulder arthroplasty.  Per chart review lives with spouse independent with assistive device prior to admission.  Presented 12/23/2018 to [facility] with left upper extremity weakness.  She denied any chest pain or shortness of breath.  Cranial CT scan negative for acute changes.  Chronic microvascular ischemic change as well as prior right MCA distribution infarct and prior right basal ganglia lacunar infarct.  Patient did not receive TPA.  Carotid Doppler showed chronically occluded right ICA as well as moderate left ICA atherosclerosis.  Left ICA stenosis estimated at 50 to 69%.  MRI MRA showed multiple small acute right MCA infarcts as well as stable 3.7 cm left anterior parafalcine meningioma unchanged since 2019.  Echocardiogram with ejection fraction of 123456 normal systolic function.  EEG negative for seizure.  COVID negative, hemoglobin 12.1, hemoglobin A1c 10.3.  Patient was discharged home 12/27/2018 maintained on full-strength aspirin ambulating minimal assistance.  She developed increased left-sided weakness admitted to the office of Dr. Sinda Du 12/29/2018.  Follow-up MRI showed multiple new acute right MCA infarcts involving the frontal and parietal lobe.  Subacute and chronic infarcts also noted.  She was transferred to [facility] for further evaluation.  CTA of head and neck showed bilateral string sign stenosis of both ICA origins likely due to bulky calcification.  Vascular surgery consulted for carotid stenosis Dr. Carlis Abbott who recommend no surgery at this time follow-up outpatient.  Plavix was added to regimen and continued aspirin.  Subcutaneous Lovenox for DVT prophylaxis.  Patient was admitted for a comprehensive rehab program   Hospital Course: [place]  A Regino was admitted to rehab 01/03/2019 for inpatient therapies to consist of PT, ST and OT at least three hours five days a week. Past admission physiatrist, therapy team and rehab RN have worked together to  provide customized collaborative inpatient rehab.  Pertaining to patient right MCA infarction in the setting of right ICA occlusion and severe left ICA stenosis.  She remained on aspirin and Plavix therapy would follow-up neurology services.  Subtends Lovenox for DVT prophylaxis no bleeding episodes.  Pain management use of Tylenol as well as Voltaren gel 4 times daily to left hand and thumb as well as order for a neoprene thumb spica splint per prosthetic team.  In regards to patient's carotid stenosis she would follow-up outpatient vascular surgery.  Blood pressures controlled on low-dose Norvasc and follow-up primary MD.  Hemoglobin A1c 10.3 insulin therapy as directed with diabetic teaching.  She remained on Lipitor for hyperlipidemia.  Hormone supplement for hypothyroidism.   Blood pressures were monitored on TID basis and remained controlled  Diabetes has been monitored with ac/hs CBG checks and SSI was use prn for tighter BS control.   Marlana Salvage is continent of bowel and bladder.  Marlana Salvage has made gains during rehab stay and is attending full therapies  He/She will continue to receive follow up therapies   after discharge  Rehab course: During patient's stay in rehab weekly team conferences were held to monitor patient's progress, set goals and discuss barriers to discharge. At admission, patient required minimal assist ambulate 15 feet, moderate assist stand pivot transfers, moderate assist sit to supine and supine to sit.  Moderate assist upper body bathing moderate assist lower body bathing moderate assist toilet transfers  Physical exam.  Blood pressure 107/90 pulse 78 temperature 97.9 respirations 14 oxygen saturations 100% Constitutional.  No distress HEENT Head.  Normocephalic and atraumatic Eyes.  Pupils round and reactive to light no nystagmus EOMs normal Neck.  Supple nontender no tracheal deviation no thyromegaly without JVD Cardiovascular normal rate and rhythm exam reveals no  friction rub or murmur heard Respiratory effort normal no respiratory distress without wheezes GI.  Soft no distention nontender without rebound Musculoskeletal normal range of motion no edema Neurological she is alert exhibits normal muscle tone coordination abnormal she is alert no acute distress makes good eye contact with examiner fair awareness of deficits speech mildly slurred.  Left central 7.  Right upper extremity right lower extremity 4 out of 5 left lower extremity 0 out of 5 left lower extremity 3 to 3+ out of 5 proximal to distal senses pain and light touch in all fours.  She  has had improvement in activity tolerance, balance, postural control as well as ability to compensate for deficits. She has had improvement in functional use RUE/LUE  and RLE/LLE as well as improvement in awareness.  Working with energy conservation techniques.  Self propelled her wheelchair to the therapy gym with supervision.  Needed occasional minimal assist to correct when bumping into objects on the left.  Worked on [employer] with rolling walker.  Ascended and descended 4 inch platform x4 minimal assist overall with rolling walker management.  Worked on ambulation rolling walker management and household environment while negotiating obstacle course.  Patient complete stand pivot transfers to wheelchair with hand-held assist and contact-guard assist with verbal cues.  Complete stand pivot transfers with grab bar to the shower with minimal verbal cues and supervision.  Completes bathing with assistance to wash left upper extremity with right upper extremity.  Full family teaching completed plan discharge to home       Disposition: Discharge disposition: 01-Home or Self Care     Discharge to home   Diet: Carb modified diet  Special Instructions: No driving smoking or alcohol  Patient will follow-up vascular surgery Dr. Carlis Abbott in regards to discussing carotid stenosis and plan of care  Medications  at discharge. 1 Tylenol as needed 2 Norvasc 5 mg daily 3.  Aspirin 325 mg p.o. daily 4.  Lipitor 80 mg p.o. daily 5.  Plavix 75 mg p.o. daily 6.  Voltaren gel 2 g 4 times daily to affected area 7.  Lantus insulin 35 units daily 8.  Synthroid 100 mcg p.o. daily 9.  Multivitamin daily 10.  Vitamin B12 1000 mcg p.o. daily 11.  Senokot S1 tablet bedtime  Discharge Instructions    Ambulatory referral to Neurology   Complete by: As directed    An appointment is requested in approximately 4 weeks right MCA infarction   Ambulatory referral to Physical Medicine Rehab   Complete by: As directed    Moderate complexity follow-up 1 to 2 weeks right MCA infarction      Follow-up Information    Meredith Staggers, MD Follow up.   Specialty: Physical Medicine and Rehabilitation Why: Office to call for appointment Contact information: [address] [place] [address] [phone]        Marty Heck, MD Follow up.   Specialty: Vascular Surgery Why: Follow-up carotid stenosis possible surgical intervention Contact information: [place] [address] [phone]        Sinda Du, MD. Call.   Specialty: Pulmonary Disease Why: when you get home to schedule a post hospital follow up visit to be seen in the next 1-2 weeks. Contact information: [address] [place] [address] [phone]           Signed: Lavon Paganini [place] 01/21/2019, 5:23 AM

## 2019-01-19 NOTE — Progress Notes (Signed)
Occupational Therapy Session Note  Patient Details  Name: Danielle [name] MRN: [id number] Date of Birth: 1932-05-07  Today's Date: 01/19/2019 OT Individual Time: 3979-5369 OT Individual Time Calculation (min): 55 min    Short Term Goals: Week 1:  OT Short Term Goal 1 (Week 1): Pt will recall hemi-dressing techniques and thread R UE into shirt with min A OT Short Term Goal 1 - Progress (Week 1): Met OT Short Term Goal 2 (Week 1): Pt will complete 1 step of LB dressing task OT Short Term Goal 2 - Progress (Week 1): Met OT Short Term Goal 3 (Week 1): Pt will demonstrate self ROM to L UE with min questioning cues OT Short Term Goal 3 - Progress (Week 1): Met  Skilled Therapeutic Interventions/Progress Updates:    1:1. Pt agreeable to shower this date with encouragement in prep for d/c. Pt completes stand pivot tansfer to w/c with [facility] at [facility] with VC for anterior weight shift. Pt completes stand pivot transfer with grab bar into shower with min VC and supervision. Pt completes bathing with A to wash LUE with RUEfor NMR. Pt cued to lean laterally to wash buttocks. Pt dons shirt with MOD A d/t tightness overhead. And pants with CGA at sit to stand level with MIN VC for threading LLE first. OT installs shoe buttons and pt able to don B shoes with CGA with OT stabilizing L shoe when pushing in heel. Exited session with pt seated in w/c, call light in reach and belt alarm on  Therapy Documentation Precautions:  Precautions Precautions: Fall Precaution Comments: L hemiparesis Restrictions Weight Bearing Restrictions: No General:   Vital Signs:  Pain: Pain Assessment Pain Scale: 0-10 Pain Score: 0-No pain ADL: ADL Eating: Minimal assistance Grooming: Minimal assistance Upper Body Bathing: Moderate assistance Lower Body Bathing: Maximal assistance Upper Body Dressing: Moderate assistance Lower Body Dressing: Maximal assistance Toileting: Moderate assistance Toilet Transfer: Minimal  assistance Toilet Transfer Method: Stand pivot Tub/Shower Transfer: Moderate assistance Tub/Shower Equipment: Nurse, learning disability    Praxis   Exercises:   Other Treatments:     Therapy/Group: Individual Therapy  Tonny Branch 01/19/2019, 8:57 AM

## 2019-01-19 NOTE — Progress Notes (Signed)
Occupational Therapy Session Note  Patient Details  Name: Danielle [name] MRN: [id number] Date of Birth: [birth year]  Today's Date: 01/19/2019 OT Individual Time: 1506-1600 OT Individual Time Calculation (min): 54 min    Short Term Goals: Week 1:  OT Short Term Goal 1 (Week 1): Pt will recall hemi-dressing techniques and thread R UE into shirt with min A OT Short Term Goal 1 - Progress (Week 1): Met OT Short Term Goal 2 (Week 1): Pt will complete 1 step of LB dressing task OT Short Term Goal 2 - Progress (Week 1): Met OT Short Term Goal 3 (Week 1): Pt will demonstrate self ROM to L UE with min questioning cues OT Short Term Goal 3 - Progress (Week 1): Met  Skilled Therapeutic Interventions/Progress Updates:    1:1. Pt received in w/c with husband present. Husband and pt decline hands on tx reporting feeling comfortable with transfers. Educated pt and husband on practice runs of bathroom transfers when urgency and shower water isnt involved as "dry runs." Pt and husband verbalize understanding. Pt requesting to work on arm. In seated and standing pt works on [employer] through L elbow/forearm for deep proprioceptive input at shoulder and elbow reaching for clothespins in seated (S) and standing for horseshoes (CGA) crossing midline to obtain and reaching laterally to place on target. Pt completes 2x20 towel glides with max HOH A with LUE (only elbow flexion with active gravity eliminated movement) shoulder flex/ext, horizontal ab/adduction, elbow flex/ext, and circles in B directions. Exited session with pt seated in w/c, call light in reach and all needs met  Therapy Documentation Precautions:  Precautions Precautions: Fall Precaution Comments: L hemiparesis Restrictions Weight Bearing Restrictions: No General:   Vital Signs: Therapy Vitals Temp: 98.4 F (36.9 C) Temp Source: Oral Pulse Rate: 75 Resp: 19 BP: (!) 117/56 Patient Position (if appropriate): Sitting Oxygen Therapy SpO2:  99 % O2 Device: Room Air Pain:   ADL: ADL Eating: Minimal assistance Grooming: Minimal assistance Upper Body Bathing: Moderate assistance Lower Body Bathing: Maximal assistance Upper Body Dressing: Moderate assistance Lower Body Dressing: Maximal assistance Toileting: Moderate assistance Toilet Transfer: Minimal assistance Toilet Transfer Method: Stand pivot Tub/Shower Transfer: Moderate assistance Tub/Shower Equipment: Nurse, learning disability    Praxis   Exercises:   Other Treatments:     Therapy/Group: Individual Therapy  Tonny Branch 01/19/2019, 3:40 PM

## 2019-01-19 NOTE — Progress Notes (Signed)
[facility] PHYSICAL MEDICINE & REHABILITATION PROGRESS NOTE   Subjective/Complaints:  Asking about hand splint  ROS: Patient deniesnausea, vomiting, diarrhea, cough, shortness of breath or chest pain, joint or back pain, headache, or mood change.    Objective:   No results found. No results for input(s): WBC, HGB, HCT, PLT in the last 72 hours. No results for input(s): NA, K, CL, CO2, GLUCOSE, BUN, CREATININE, CALCIUM in the last 72 hours.  Intake/Output Summary (Last 24 hours) at 01/19/2019 0759 Last data filed at 01/19/2019 0749 Gross per 24 hour  Intake 660 ml  Output -  Net 660 ml     Physical Exam: Vital Signs Blood pressure (!) 103/54, pulse 75, temperature 97.8 F (36.6 C), resp. rate 17, height 5\' 4"  (1.626 m), weight 59.2 kg, SpO2 100 %.  Constitutional: No distress . Vital signs reviewed. HEENT: EOMI, oral membranes moist Neck: supple Cardiovascular: RRR without murmur. No JVD    Respiratory: CTA Bilaterally without wheezes or rales. Normal effort    GI: BS +, non-tender, non-distended  Musculoskeletal:Normal range of motion. no shoulder pain. Left thumb tender at MCP jt---has spica splint with k tape   Neurological: She isalert. And oriented x 3. Left C7. RUE and RLE 4/5. LUE trace to  0/5--unchanged  LLE remains 3+/5 prox to distal--stable exam.  Normal pain and LT in all 4's--no changes today Skin: Skin iswarmand dry. She isnot diaphoretic. Noerythema.  Psychiatric:pleasant   Assessment/Plan: 1. Functional deficits secondary to right MCA infarct which require 3+ hours per day of interdisciplinary therapy in a comprehensive inpatient rehab setting.  Physiatrist is providing close team supervision and 24 hour management of active medical problems listed below.  Physiatrist and rehab team continue to assess barriers to discharge/monitor patient progress toward functional and medical goals  Care Tool:  Bathing    Body parts bathed by patient: Left  arm, Abdomen, Chest, Front perineal area, Face, Right upper leg, Left upper leg, Right lower leg, Left lower leg, Buttocks   Body parts bathed by helper: Right arm     Bathing assist Assist Level: Minimal Assistance - Patient > 75%     Upper Body Dressing/Undressing Upper body dressing   What is the patient wearing?: Button up shirt    Upper body assist Assist Level: Moderate Assistance - Patient 50 - 74%    Lower Body Dressing/Undressing Lower body dressing      What is the patient wearing?: Underwear/pull up, Pants     Lower body assist Assist for lower body dressing: Moderate Assistance - Patient 50 - 74%     Toileting Toileting    Toileting assist Assist for toileting: Moderate Assistance - Patient 50 - 74%     Transfers Chair/bed transfer  Transfers assist     Chair/bed transfer assist level: Minimal Assistance - Patient > 75%     Locomotion Ambulation   Ambulation assist      Assist level: Contact Guard/Touching assist Assistive device: Walker-rolling Max distance: 75'   Walk 10 feet activity   Assist     Assist level: Contact Guard/Touching assist Assistive device: Walker-rolling   Walk 50 feet activity   Assist Walk 50 feet with 2 turns activity did not occur: Safety/medical concerns  Assist level: Contact Guard/Touching assist Assistive device: Walker-rolling    Walk 150 feet activity   Assist Walk 150 feet activity did not occur: Safety/medical concerns  Assist level: Moderate Assistance - Patient - 50 - 74% Assistive device: Walker-rolling  Walk 10 feet on uneven surface  activity   Assist Walk 10 feet on uneven surfaces activity did not occur: Safety/medical concerns         Wheelchair     Assist Will patient use wheelchair at discharge?: Yes Type of Wheelchair: Manual Wheelchair activity did not occur: N/A  Wheelchair assist level: Supervision/Verbal cueing Max wheelchair distance: 150'    Wheelchair  50 feet with 2 turns activity    Assist    Wheelchair 50 feet with 2 turns activity did not occur: N/A   Assist Level: Supervision/Verbal cueing   Wheelchair 150 feet activity     Assist  Wheelchair 150 feet activity did not occur: N/A   Assist Level: Supervision/Verbal cueing   Blood pressure (!) 103/54, pulse 75, temperature 97.8 F (36.6 C), resp. rate 17, height 5\' 4"  (1.626 m), weight 59.2 kg, SpO2 100 %.  Medical Problem List and Plan: 1.Left-sided weaknesssecondary to recurrent right MCA infarct in setting of right ICA occlusion and severe left ICA stenosis. Plan d/c in am   -left WHO at HS, AFO LLE per [facility]    -continue supportive measures LUE 2. Antithrombotics: -DVT/anticoagulation:Lovenox -antiplatelet therapy: Aspirin 325 mg daily and Plavix 75 mg daily 3. Pain Management:Tylenol as needed  -ordered low profile neoprene thumb spica LUE per [facility]--appears to be fitting and providing some support- pt asking for clarification on wearing schedule   - voltaren gel QID to left hand/thumb with some relief   -remove k-tape from thumb 4. Mood:Provide emotional support -antipsychotic agents: N/A 5. Neuropsych: This patientiscapable of making decisions on herown behalf. 6. Skin/Wound Care:Routine skin checks 7. Fluids/Electrolytes/Nutrition: encourage PO  -intake reasonable 8. Severe left internalcarotid stenosis. Follow-up outpatient vascular surgery Dr. Carlis Abbott to consider intervention 9. Hypertension. Norvasc 5 mg daily. Controlled at present 10. Diabetes mellitus. Hemoglobin A1c 10.3.  -sugars labile  -AM CBG high yesterday (217) and a little better today (147)  -continue AM lantus  26u--increase to 30u effective 9/2 (already has received this morning)   -  Dc'ed PM lantus 8/30  -pt is on heart healthy cm diet   CBG (last 3)  Recent Labs    01/18/19 1629 01/18/19 2109 01/19/19 0600  GLUCAP 189* 227* 123*  pm  elevated last noc, will see effect of increased Lantus tomoorow 11. Hyperlipidemia. Lipitor 12. Hypothyroidism. Continue Synthroid 13. Constipation: Needs a bm  Moved bowels 8/30    LOS: 16 days A FACE TO Girard E Winnifred Dufford 01/19/2019, 7:59 AM

## 2019-01-19 NOTE — Progress Notes (Signed)
Physical Therapy Session Note  Patient Details  Name: Danielle [name] MRN: [id number] Date of Birth: February 16, 1932  Today's Date: 01/19/2019 PT Individual Time: 0930-1040 PT Individual Time Calculation (min): 70 min   Short Term Goals: Week 3:  PT Short Term Goal 1 (Week 3): =LTGs due to ELOS  Skilled Therapeutic Interventions/Progress Updates:   Pt in w/c and agreeable to therapy, no c/o pain. Husband not present for session. Pt self-propelled w/c to/from therapy gym w/ primarily supervision x150'. Needed occasional min assist to correct when bumping into objects on L side, verbal and visual cues throughout for obstacle avoidance. Worked on Chartered certified accountant w/ RW as pt and husband are unsure if she will be using both a L rail and RW to negotiate platform steps into house. L rail on 1st two platforms, no rail on last step into kitchen. Ascended/descended 4" platform x4 reps in total, min assist overall for RW management and verbal/visual cues for sequencing and technique. Minimal carryover of technique in between reps. Performed same task w/ 8" curb, no increased assistance needed. Worked on gait and RW management in household environment while negotiating obstacle course. Min assist to step over object on floor and weave around 3 cones. Min visual, verbal, and tactile cues for RW management and safety. Performed task x2 reps. Returned to room and ended session in w/c, all needs in reach. Husband present at end of session.   Ongoing education on home modifications to decrease risk of falls including removal of area rugs, moving furniture, etc. Husband also confirmed pt's description of steps to enter home, states "our house is just not set up to be handicap accessible". Husband continues to have poor insight of pt's deficits and risk of falls. Discussed benefits of moving to more handicap accessible housing in the future, such as independent living or retirement communities. Educated both on realistic  expectations of stroke recovery given pt's age and baseline history of falls. Pt seemed very open to this and knows multiple friends who have made that transition, husband stated "that's something we will worry about in the future if we have to". Provided w/ written handout of home modifications for fall risk reduction.   Therapy Documentation Precautions:  Precautions Precautions: Fall Precaution Comments: L hemiparesis Restrictions Weight Bearing Restrictions: No Pain: Pain Assessment Pain Scale: 0-10 Pain Score: 0-No pain  Therapy/Group: Individual Therapy  Jameire Kouba [name] 01/19/2019, 10:41 AM

## 2019-01-20 ENCOUNTER — Inpatient Hospital Stay (HOSPITAL_COMMUNITY): Payer: Medicare Other | Admitting: Occupational Therapy

## 2019-01-20 ENCOUNTER — Inpatient Hospital Stay (HOSPITAL_COMMUNITY): Payer: Medicare Other | Admitting: Physical Therapy

## 2019-01-20 LAB — GLUCOSE, CAPILLARY
Glucose-Capillary: 159 mg/dL — ABNORMAL HIGH (ref 70–99)
Glucose-Capillary: 297 mg/dL — ABNORMAL HIGH (ref 70–99)
Glucose-Capillary: 112 mg/dL — ABNORMAL HIGH (ref 70–99)
Glucose-Capillary: 213 mg/dL — ABNORMAL HIGH (ref 70–99)

## 2019-01-20 MED ORDER — VITAMIN B-12 1000 MCG PO TABS: 1000.0000 ug | ORAL_TABLET | Freq: Every day | ORAL | 0 refills | Status: AC

## 2019-01-20 MED ORDER — BISACODYL 10 MG RE SUPP
10.0000 mg | Freq: Every day | RECTAL | Status: DC | PRN
  Filled 2019-01-20: qty 1

## 2019-01-20 MED ORDER — ATORVASTATIN CALCIUM 80 MG PO TABS: 80.0000 mg | ORAL_TABLET | Freq: Every day | ORAL | 0 refills | Status: DC

## 2019-01-20 MED ORDER — DICLOFENAC SODIUM 1 % TD GEL: 2.0000 g | Freq: Four times a day (QID) | TRANSDERMAL | 0 refills | Status: DC

## 2019-01-20 MED ORDER — SYNTHROID 100 MCG PO TABS: 100.0000 ug | ORAL_TABLET | Freq: Every morning | ORAL | 0 refills | Status: DC

## 2019-01-20 MED ORDER — ACETAMINOPHEN 325 MG PO TABS: 650.0000 mg | ORAL_TABLET | Freq: Four times a day (QID) | ORAL | Status: AC | PRN

## 2019-01-20 MED ORDER — CLOPIDOGREL BISULFATE 75 MG PO TABS: 75.0000 mg | ORAL_TABLET | Freq: Every day | ORAL | 1 refills | Status: DC

## 2019-01-20 MED ORDER — AMLODIPINE BESYLATE 5 MG PO TABS: 5.0000 mg | ORAL_TABLET | Freq: Every day | ORAL | 11 refills | Status: DC

## 2019-01-20 MED ORDER — SENNOSIDES-DOCUSATE SODIUM 8.6-50 MG PO TABS: 2.0000 | ORAL_TABLET | Freq: Every day | ORAL | Status: DC

## 2019-01-20 MED ORDER — SENNOSIDES-DOCUSATE SODIUM 8.6-50 MG PO TABS
2.0000 | ORAL_TABLET | Freq: Every day | ORAL | Status: DC
  Administered 2019-01-20: 22:00:00 2 via ORAL
  Filled 2019-01-20 (×2): qty 2

## 2019-01-20 MED ORDER — INSULIN GLARGINE 100 UNIT/ML SOLOSTAR PEN: 35.0000 [IU] | PEN_INJECTOR | Freq: Every day | SUBCUTANEOUS | 11 refills | Status: DC

## 2019-01-20 MED ORDER — PROSIGHT PO TABS: 1.0000 | ORAL_TABLET | Freq: Every day | ORAL | 0 refills | Status: DC

## 2019-01-20 NOTE — Progress Notes (Signed)
Physical Therapy Discharge Summary  Patient Details  Name: Danielle [name] MRN: [id number] Date of Birth: 06-03-1931  Today's Date: 01/20/2019 PT Individual Time: 1300-1400 PT Individual Time Calculation (min): 60 min   Pt in w/c and agreeable to therapy, no c/o pain. Total assist w/c transport to/from therapy gym. Practiced 8" curb negotiation and step negotiation x4 steps w/ L rail, husband providing hands-on min assist w/o physical or verbal assist from this therapist. Practiced gait in household environment while weaving around cones x3 cones. Husband provided hands-on for this as well, educated him on pt's L inattention and tendency to have RW placed too far to her R, causing her LLE to hit RW during steps. Husband saw this happening and appropriately cued pt. Educated both on R MCA stroke deficits of L inattention in context of functional tasks. NuStep 5 min @ level 3 w/ BLEs and RUE, 3 min w/ BLEs only. Pt self-propelled w/c back to room w/ supervision via R hemi technique. Performed bed mobility, needed min assist for sit<>supine 2/2 global weakness. Ended session in w/c, all needs in reach.   Patient has met 8 of 10 long term goals due to improved activity tolerance, improved balance, improved postural control, increased strength, ability to compensate for deficits, functional use of  left lower extremity, improved attention, improved awareness and improved coordination.  Patient to discharge at an ambulatory level contact guard assist and supervision for w/c propulsion. W/c to be used intermittently for energy conservation and increased independence. Patient's care partner is independent to provide the necessary physical assistance at discharge. Pt's husband has been educated on hands-on care during multiple therapy sessions over the last week. He has demonstrated safe CGA during gait/transfers and min assist for curb/stair negotiation w/ RW. Both husband and pt have been provided w/ extensive  education regarding pt's high fall risk given diagnosis and history of falls. However pt's husband has limited insight of pt's deficits (despite ongoing education) and would continue to benefit from education regarding her deficits and fall risk in all future PT encounters.   Reasons goals not met: Pt continues to require min assist for car transfer for safety. Pt continues to require min assist for sit<>supine 2/2 global weakness. Husband able to provide this level of assist for these tasks.   Recommendation:  Patient will benefit from ongoing skilled PT services in home health setting to continue to advance safe functional mobility, address ongoing impairments in functional balance, LLE strength and coordination, and endurance, and minimize fall risk.  Equipment: L foot-up brace, youth RW, and 16x16 w/c w/ lap tray   Reasons for discharge: treatment goals met and discharge from hospital  Patient/family agrees with progress made and goals achieved: Yes  PT Discharge Precautions/Restrictions Precautions Precautions: Fall Restrictions Weight Bearing Restrictions: No Vital Signs Therapy Vitals Pulse Rate: 71 Resp: 20 BP: (!) 132/54 Patient Position (if appropriate): Sitting Oxygen Therapy SpO2: 97 % O2 Device: Room Air Vision/Perception  Perception Perception: Impaired Inattention/Neglect: Does not attend to left visual field;Does not attend to left side of body(although improved since eval) Praxis Praxis: Intact(decreased motor planning w/ novel tasks)  Cognition Overall Cognitive Status: Within Functional Limits for tasks assessed Arousal/Alertness: Awake/alert Orientation Level: Oriented X4 Memory: Impaired Memory Impairment: Decreased recall of new information(this appears to be baseline) Awareness: Appears intact Problem Solving: Appears intact Behaviors: Impulsive Safety/Judgment: Impaired Comments: Mild impulsivity and easily distracted, appears  baseline Sensation Sensation Light Touch: Appears Intact(LEs) Coordination Gross Motor Movements are Fluid and Coordinated:  No Heel Shin Test: impaired LLE Motor  Motor Motor: Hemiplegia;Abnormal tone Motor - Discharge Observations: L hemi, UE>LE  Mobility Bed Mobility Bed Mobility: Sit to Supine;Supine to Sit;Rolling Right;Rolling Left Rolling Right: Supervision/verbal cueing Rolling Left: Supervision/Verbal cueing Supine to Sit: Minimal Assistance - Patient > 75% Sit to Supine: Minimal Assistance - Patient > 75% Transfers Transfers: Stand to Sit;Sit to Stand;Stand Pivot Transfers Sit to Stand: Contact Guard/Touching assist Stand to Sit: Contact Guard/Touching assist Stand Pivot Transfers: Contact Guard/Touching assist Transfer (Assistive device): None Locomotion  Gait Ambulation: Yes Gait Assistance: Contact Guard/Touching assist Gait Distance (Feet): 50 Feet Assistive device: Rolling walker Gait Assistance Details: Manual facilitation for weight shifting;Manual facilitation for placement;Tactile cues for weight shifting;Tactile cues for initiation;Verbal cues for precautions/safety;Verbal cues for sequencing;Verbal cues for technique;Verbal cues for safe use of DME/AE Gait Gait: Yes Gait Pattern: Impaired Gait Pattern: Decreased dorsiflexion - right;Decreased weight shift to right;Narrow base of support;Poor foot clearance - right Gait velocity: decreased Stairs / Additional Locomotion Stairs: Yes Stairs Assistance: Minimal Assistance - Patient > 75% Stair Management Technique: One rail Left Number of Stairs: 4 Height of Stairs: 6 Curb: Minimal Assistance - Patient >75% Wheelchair Mobility Wheelchair Mobility: Yes Wheelchair Assistance: Chartered loss adjuster: Right upper extremity;Right lower extremity Wheelchair Parts Management: Needs assistance Distance: 150'  Trunk/Postural Assessment  Cervical Assessment Cervical Assessment:  Exceptions to WFL(forward head) Thoracic Assessment Thoracic Assessment: Exceptions to WFL(rounded shoulders) Lumbar Assessment Lumbar Assessment: Exceptions to WFL(posterior pelvic tilt) Postural Control Postural Control: Deficits on evaluation(delayed/insufficient, posterior and L lateral lean bias, impaired midline)  Balance Balance Balance Assessed: Yes Static Sitting Balance Static Sitting - Balance Support: No upper extremity supported;Feet supported Static Sitting - Level of Assistance: 5: Stand by assistance Dynamic Sitting Balance Dynamic Sitting - Balance Support: No upper extremity supported;Feet supported Dynamic Sitting - Level of Assistance: 5: Stand by assistance Static Standing Balance Static Standing - Balance Support: No upper extremity supported;During functional activity Static Standing - Level of Assistance: 5: Stand by assistance Dynamic Standing Balance Dynamic Standing - Balance Support: No upper extremity supported;During functional activity Dynamic Standing - Level of Assistance: 5: Stand by assistance(CGA) Extremity Assessment  RLE Assessment RLE Assessment: Within Functional Limits LLE Assessment LLE Assessment: Exceptions to [facility] Passive Range of Motion (PROM) Comments: [facility] General Strength Comments: Globally 3+ to 4-/5    Danielle [name] 01/20/2019, 2:02 PM

## 2019-01-20 NOTE — Plan of Care (Signed)

## 2019-01-20 NOTE — Progress Notes (Signed)
Occupational Therapy Session Note  Patient Details  Name: Danielle [name] MRN: [id number] Date of Birth: 06-Aug-1931  Today's Date: 01/20/2019 OT Individual Time: 1030-1130 OT Individual Time Calculation (min): 60 min    Short Term Goals: Week 2:  OT Short Term Goal 1 (Week 2): LTG=STG 2/2 ELOS  Skilled Therapeutic Interventions/Progress Updates:    Pt greeted sitting in recliner and agreeable to OT treatment session focused on family education and L UE NMR. Educated pt's spouse on LB dressing modifications using friction reducing device to don TED hose- spouse demonstrated understanding of technique. Pt able to don tennis shoes and fasten them using shoe buttons. OT had pt's spouse practice re-tying shoes in case they came undone. Pt did need assistance for footup brace, which spouse demonstrated understanding. Pt's spouse assisted with stand-pivot transfer to wc with CGA. Pt's spouse gave appropriate safety cues throughout transfer. Discussed home set-up, DME uses, and practiced wc functions with pt and spouse. Pt brought to therapy gym in wc and OT removed thumb spica splint and replaced kinesiotape for L hand swelling. Pt with increased grasp in L hand today, but unable to extend fingers. OT issued yellow foam and practiced squeezing foam. Pt then reported need to go to the bathroom. Pt returned to room, stand-pivot to toilet with CGA. Pt able to doff clothing, void bladder, and complete peri-care. CGA to pull pants back up on L side. Pt left seated in wc with alarm belt on and needs met.   Therapy Documentation Precautions:  Precautions Precautions: Fall Precaution Comments: L hemiparesis Restrictions Weight Bearing Restrictions: No Pain: Pain Assessment Pain Scale: 0-10 Pain Score: 0-No pain   Therapy/Group: Individual Therapy  Valma Cava 01/20/2019, 11:34 AM

## 2019-01-20 NOTE — Progress Notes (Signed)
Occupational Therapy Discharge Summary  Patient Details  Name: Danielle [name] MRN: [id number] Date of Birth: [birth year]  Today's Date: 01/20/2019 OT Individual Time: 3244-0102 OT Individual Time Calculation (min): 70 min    Pt greeted sitting in wc with spouse present. Answered questions regarding dc tomorrow, home modifications for BADL participation, and home health therapies. Pt's spouse had questions regarding use of sling. OT educated on neuro re-eduction and that since she is ambulating with a RW and hand splint, this should provide enough support at the shoulder to keep it from "flopping." Showed them the potential for a Giv-Mor sling once pt graduates to ambulating with a cane and informed them that [facility] or OP therapies can help with this as well. Applied 1:1 NMES to CH1 supraspinatus and middle deltoid to help approximate shoulder joint, and Ch 2 wrist extensors.  Ratio 1:1 Rate 35 pps Waveform- Asymmetric Ramp 1.0 Pulse 300 CH1 Intensity- 23  Duration -  15  CH2 Intensity- 27 Duration -  15   Pt returned to room and left seated in wc with spouse present and needs met.   Patient has met 12 of 12 long term goals due to improved activity tolerance, improved balance, postural control, ability to compensate for deficits and functional use of  LEFT upper and LEFT lower extremity.  Patient to discharge at [facility] CGA/ Min Assist level.  Patient's care partner is independent to provide the necessary physical assistance at discharge.    Reasons goals not met: n/a  Recommendation:  Patient will benefit from ongoing skilled OT services in home health setting to continue to advance functional skills in the area of BADL.  Equipment: wc, 1/2 lap tray, tub transfer bench, 3-in-1 BSC, RW  Reasons for discharge: treatment goals met and discharge from hospital  Patient/family agrees with progress made and goals achieved: Yes  OT Discharge Precautions/Restrictions   Precautions Precautions: Fall Precaution Comments: L hemiparesis Restrictions Weight Bearing Restrictions: No Pain  denies pain ADL ADL Eating: Set up Grooming: Modified independent Upper Body Bathing: Contact guard Lower Body Bathing: Contact guard Upper Body Dressing: Contact guard Lower Body Dressing: Contact guard Toileting: Contact guard Toilet Transfer: Contact guard Toilet Transfer Method: Stand pivot Tub/Shower Transfer: Contact guard Tub/Shower Equipment: Radio broadcast assistant Vision Baseline Vision/History: No visual deficits Wears Glasses: Reading only Patient Visual Report: No change from baseline Vision Assessment?: No apparent visual deficits Perception  Perception: Impaired Inattention/Neglect: Does not attend to left visual field;Does not attend to left side of body( improved since eval) Praxis Praxis: Intact(decreased motor planning w/ novel tasks) Cognition Overall Cognitive Status: Within Functional Limits for tasks assessed Arousal/Alertness: Awake/alert Orientation Level: Oriented X4 Memory: Impaired Memory Impairment: Decreased recall of new information(this appears to be baseline) Awareness: Appears intact Problem Solving: Appears intact Behaviors: Impulsive Safety/Judgment: Impaired Comments: Mild impulsivity and easily distracted, appears baseline Sensation Sensation Light Touch: Appears Intact Coordination Gross Motor Movements are Fluid and Coordinated: No Fine Motor Movements are Fluid and Coordinated: No Coordination and Movement Description: L UE impaired 2/2 hemipresis  Heel Shin Test: impaired LLE Motor  Motor Motor: Hemiplegia;Abnormal tone Motor - Discharge Observations: L hemi, UE>LE Mobility  Bed Mobility Bed Mobility: Sit to Supine;Supine to Sit;Rolling Right;Rolling Left Rolling Right: Supervision/verbal cueing Rolling Left: Supervision/Verbal cueing Supine to Sit: Minimal Assistance - Patient > 75% Sit to Supine: Minimal  Assistance - Patient > 75% Transfers Sit to Stand: Contact Guard/Touching assist Stand to Sit: Contact Guard/Touching assist  Trunk/Postural Assessment  Cervical Assessment Cervical  Assessment: Exceptions to WFL(forward head) Thoracic Assessment Thoracic Assessment: Exceptions to WFL(rounded shoulders) Lumbar Assessment Lumbar Assessment: Exceptions to WFL(posterior pelvic tilt) Postural Control Postural Control: Deficits on evaluation(delayed/insufficient, posterior and L lateral lean bias, impaired midline)  Balance Balance Balance Assessed: Yes Static Sitting Balance Static Sitting - Balance Support: No upper extremity supported;Feet supported Static Sitting - Level of Assistance: 5: Stand by assistance Dynamic Sitting Balance Dynamic Sitting - Balance Support: No upper extremity supported;Feet supported Dynamic Sitting - Level of Assistance: 5: Stand by assistance Static Standing Balance Static Standing - Balance Support: No upper extremity supported;During functional activity Static Standing - Level of Assistance: 5: Stand by assistance Dynamic Standing Balance Dynamic Standing - Balance Support: No upper extremity supported;During functional activity Dynamic Standing - Level of Assistance: 5: Stand by assistance(CGA) Extremity/Trunk Assessment RUE Assessment RUE Assessment: Exceptions to [facility] Active Range of Motion (AROM) Comments: limited shoulder ROM 0-90 2/2 previous TSA  LUE Assessment LUE Assessment: Exceptions to [facility] LUE Body System: Neuro Brunstrum levels for arm and hand: Arm;Hand Brunstrum level for arm: Stage III Synergy is performed voluntarily Brunstrum level for hand: Stage III Synergies performed voluntarily   Valma Cava 01/20/2019, 3:32 PM

## 2019-01-20 NOTE — Discharge Instructions (Signed)
Inpatient Rehab Discharge Instructions  [place] Discharge date and time: No discharge date for patient encounter.   Activities/Precautions/ Functional Status: Activity: activity as tolerated Diet: diabetic diet Wound Care: none needed Functional status:  ___ No restrictions     ___ Walk up steps independently ___ 24/7 supervision/assistance   ___ Walk up steps with assistance ___ Intermittent supervision/assistance  ___ Bathe/dress independently ___ Walk with walker     _x__ Bathe/dress with assistance ___ Walk Independently    ___ Shower independently ___ Walk with assistance    ___ Shower with assistance ___ No alcohol     ___ Return to work/school ________  COMMUNITY REFERRALS UPON DISCHARGE:   Home Health:   PT     OT     SNA  Agency:  [facility] Phone:  [phone] Medical Equipment/Items Ordered:   16"x16" lightweight wheelchair with L lap tray and basic cushion; youth rolling walker; bedside commode; tub transfer bench  Agency/Supplier:  [employer]       Phone:  [phone]  GENERAL COMMUNITY RESOURCES FOR PATIENT/FAMILY: Support Groups:    [facility] [facility] - on HOLD for COVID-19                                Call Lewanda Rife at [phone] or Mauricio Po at [phone] to see when they are resuming the group.  Special Instructions: No driving smoking or alcohol  Follow-up Dr. Carlis Abbott vascular surgery [phone] to discuss plan for left carotid artery stenosis  STROKE/TIA DISCHARGE INSTRUCTIONS SMOKING Cigarette smoking nearly doubles your risk of having a stroke & is the single most alterable risk factor  If you smoke or have smoked in the last 12 months, you are advised to quit smoking for your health.  Most of the excess cardiovascular risk related to smoking disappears within a year of stopping.  Ask you doctor about anti-smoking medications  [place] Quit Line: 1-800-QUIT NOW  Free Smoking Cessation  Classes [phone]  CHOLESTEROL Know your levels; limit fat & cholesterol in your diet  Lipid Panel     Component Value Date/Time   CHOL 171 12/24/2018 0443   TRIG 75 12/24/2018 0443   HDL 59 12/24/2018 0443   CHOLHDL 2.9 12/24/2018 0443   VLDL 15 12/24/2018 0443   LDLCALC 97 12/24/2018 0443      Many patients benefit from treatment even if their cholesterol is at goal.  Goal: Total Cholesterol (CHOL) less than 160  Goal:  Triglycerides (TRIG) less than 150  Goal:  HDL greater than 40  Goal:  LDL (LDLCALC) less than 100   BLOOD PRESSURE American Stroke Association blood pressure target is less that 120/80 mm/Hg  Your discharge blood pressure is:  BP: 115/74  Monitor your blood pressure  Limit your salt and alcohol intake  Many individuals will require more than one medication for high blood pressure  DIABETES (A1c is a blood sugar average for last 3 months) Goal HGBA1c is under 7% (HBGA1c is blood sugar average for last 3 months)  Diabetes:     Lab Results  Component Value Date   HGBA1C 10.3 (H) 12/23/2018     Your HGBA1c can be lowered with medications, healthy diet, and exercise.  Check your blood sugar as directed by your physician  Call your physician if you experience unexplained or low blood sugars.  PHYSICAL ACTIVITY/REHABILITATION Goal is 30 minutes at  least 4 days per week  Activity: Increase activity slowly, Therapies: Physical Therapy: Home Health Return to work:   Activity decreases your risk of heart attack and stroke and makes your heart stronger.  It helps control your weight and blood pressure; helps you relax and can improve your mood.  Participate in a regular exercise program.  Talk with your doctor about the best form of exercise for you (dancing, walking, swimming, cycling).  DIET/WEIGHT Goal is to maintain a healthy weight  Your discharge diet is:  Diet Order            Diet heart healthy/carb modified Room service appropriate? Yes  with Assist; Fluid consistency: Thin; Fluid restriction: 1500 mL Fluid  Diet effective now              liquids Your height is:  Height: 5\' 4"  (162.6 cm) Your current weight is: Weight: 59.2 kg Your Body Mass Index (BMI) is:  BMI (Calculated): 22.39  Following the type of diet specifically designed for you will help prevent another stroke.  Your goal weight range is:    Your goal Body Mass Index (BMI) is 19-24.  Healthy food habits can help reduce 3 risk factors for stroke:  High cholesterol, hypertension, and excess weight.  RESOURCES Stroke/Support Group:  Call [phone]   STROKE EDUCATION PROVIDED/REVIEWED AND GIVEN TO PATIENT Stroke warning signs and symptoms How to activate emergency medical system (call 911). Medications prescribed at discharge. Need for follow-up after discharge. Personal risk factors for stroke. Pneumonia vaccine given:  Flu vaccine given:  My questions have been answered, the writing is legible, and I understand these instructions.  I will adhere to these goals & educational materials that have been provided to me after my discharge from the hospital.   to discuss possible carotid surgery   My questions have been answered and I understand these instructions. I will adhere to these goals and the provided educational materials after my discharge from the hospital.  Patient/Caregiver Signature _______________________________ Date __________  Clinician Signature _______________________________________ Date __________  Please bring this form and your medication list with you to all your follow-up doctor's appointments.

## 2019-01-20 NOTE — Progress Notes (Signed)
[place] PHYSICAL MEDICINE & REHABILITATION PROGRESS NOTE   Subjective/Complaints:  No new issues. Left hand still sore but feels better in splint  ROS: Patient denies fever, rash, sore throat, blurred vision, nausea, vomiting, diarrhea, cough, shortness of breath or chest pain,  headache, or mood change.     Objective:   No results found. No results for input(s): WBC, HGB, HCT, PLT in the last 72 hours. No results for input(s): NA, K, CL, CO2, GLUCOSE, BUN, CREATININE, CALCIUM in the last 72 hours.  Intake/Output Summary (Last 24 hours) at 01/20/2019 0940 Last data filed at 01/20/2019 0810 Gross per 24 hour  Intake 720 ml  Output -  Net 720 ml     Physical Exam: Vital Signs Blood pressure 140/62, pulse 75, temperature 98 F (36.7 C), temperature source Oral, resp. rate 15, height 5\' 4"  (1.626 m), weight 59.2 kg, SpO2 99 %.  Constitutional: No distress . Vital signs reviewed. HEENT: EOMI, oral membranes moist Neck: supple Cardiovascular: RRR without murmur. No JVD    Respiratory: CTA Bilaterally without wheezes or rales. Normal effort    GI: BS +, non-tender, non-distended  Musculoskeletal:Normal range of motion. no shoulder pain. Left thumb remains tender at MCP jt---has spica splint with k tape in place   Neurological: She isalert. And oriented x 3. Left C7. RUE and RLE 4/5. LUE trace  To 1/5 wrist,biceps, finger flexion in synergy pattern.  LLE remains 3+/5 prox to distal--stable exam.  Normal pain and LT in all 4's  Skin: Skin iswarmand dry. She isnot diaphoretic. Noerythema.  Psychiatric:pleasant   Assessment/Plan: 1. Functional deficits secondary to right MCA infarct which require 3+ hours per day of interdisciplinary therapy in a comprehensive inpatient rehab setting.  Physiatrist is providing close team supervision and 24 hour management of active medical problems listed below.  Physiatrist and rehab team continue to assess barriers to discharge/monitor  patient progress toward functional and medical goals  Care Tool:  Bathing    Body parts bathed by patient: Left arm, Abdomen, Chest, Front perineal area, Face, Right upper leg, Left upper leg, Right lower leg, Left lower leg, Buttocks   Body parts bathed by helper: Right arm     Bathing assist Assist Level: Contact Guard/Touching assist     Upper Body Dressing/Undressing Upper body dressing   What is the patient wearing?: Button up shirt    Upper body assist Assist Level: Moderate Assistance - Patient 50 - 74%    Lower Body Dressing/Undressing Lower body dressing      What is the patient wearing?: Underwear/pull up, Pants     Lower body assist Assist for lower body dressing: Minimal Assistance - Patient > 75%     Toileting Toileting    Toileting assist Assist for toileting: Moderate Assistance - Patient 50 - 74%     Transfers Chair/bed transfer  Transfers assist     Chair/bed transfer assist level: Minimal Assistance - Patient > 75%     Locomotion Ambulation   Ambulation assist      Assist level: Minimal Assistance - Patient > 75% Assistive device: Walker-rolling Max distance: 50'   Walk 10 feet activity   Assist     Assist level: Minimal Assistance - Patient > 75% Assistive device: Walker-rolling   Walk 50 feet activity   Assist Walk 50 feet with 2 turns activity did not occur: Safety/medical concerns  Assist level: Minimal Assistance - Patient > 75% Assistive device: Walker-rolling    Walk 150 feet activity  Assist Walk 150 feet activity did not occur: Safety/medical concerns  Assist level: Moderate Assistance - Patient - 50 - 74% Assistive device: Walker-rolling    Walk 10 feet on uneven surface  activity   Assist Walk 10 feet on uneven surfaces activity did not occur: Safety/medical concerns         Wheelchair     Assist Will patient use wheelchair at discharge?: Yes Type of Wheelchair: Manual Wheelchair  activity did not occur: N/A  Wheelchair assist level: Supervision/Verbal cueing Max wheelchair distance: 150'    Wheelchair 50 feet with 2 turns activity    Assist    Wheelchair 50 feet with 2 turns activity did not occur: N/A   Assist Level: Supervision/Verbal cueing   Wheelchair 150 feet activity     Assist  Wheelchair 150 feet activity did not occur: N/A   Assist Level: Supervision/Verbal cueing   Blood pressure 140/62, pulse 75, temperature 98 F (36.7 C), temperature source Oral, resp. rate 15, height 5\' 4"  (1.626 m), weight 59.2 kg, SpO2 99 %.  Medical Problem List and Plan: 1.Left-sided weaknesssecondary to recurrent right MCA infarct in setting of right ICA occlusion and severe left ICA stenosis. Plan d/c in am   -left WHO at HS, AFO LLE per [facility]    -continue supportive measures LUE 2. Antithrombotics: -DVT/anticoagulation:Lovenox -antiplatelet therapy: Aspirin 325 mg daily and Plavix 75 mg daily 3. Pain Management:Tylenol as needed  -ordered low profile neoprene thumb spica LUE per [facility]--appears to be fitting and providing some support-   - voltaren gel QID to left hand/thumb with some relief  -might benefit from steroids, but has had reaction before to prednisone 4. Mood:Provide emotional support -antipsychotic agents: N/A 5. Neuropsych: This patientiscapable of making decisions on herown behalf. 6. Skin/Wound Care:Routine skin checks 7. Fluids/Electrolytes/Nutrition: encourage PO  -intake reasonable 8. Severe left internalcarotid stenosis. Follow-up outpatient vascular surgery Dr. Carlis Abbott to consider intervention 9. Hypertension. Norvasc 5 mg daily. Controlled at present 10. Diabetes mellitus. Hemoglobin A1c 10.3.  -sugars labile  -  AM lantus increased to 30u effective 9/2 --still with high PM's numbers---increase to 35u tomorrow   - Dc'ed PM lantus 8/30  -pt is on heart healthy cm diet   CBG (last 3)   Recent Labs    01/19/19 1641 01/19/19 2051 01/20/19 0635  GLUCAP 148* 189* 159*     11. Hyperlipidemia. Lipitor 12. Hypothyroidism. Continue Synthroid 13. Constipation:    Moved bowels 8/30--needs bm today   No results with miralax or sorbitol   -try dulcolax suppository today  -increase scheduled senna-s to 2 tabs  LOS: 17 days A FACE TO [place] 01/20/2019, 9:40 AM

## 2019-01-21 LAB — GLUCOSE, CAPILLARY
Glucose-Capillary: 104 mg/dL — ABNORMAL HIGH (ref 70–99)
Glucose-Capillary: 76 mg/dL (ref 70–99)

## 2019-01-21 NOTE — Progress Notes (Signed)
Pt discharged to home. Accompanied by husband transported via [facility] by Camera operator. Discharge teaching provided by Janet Berlin. No further questions nor concerns. Larina Earthly, LPN

## 2019-01-21 NOTE — Progress Notes (Signed)
Patient given increased scheduled dose of 2 senna last night. No BM noted this shift. Pt declined any type of laxative this morning. Stated that she was going home and "will probably go today". Pt educated on effects of constipation and patient again, declined.

## 2019-01-21 NOTE — Progress Notes (Signed)
[place] PHYSICAL MEDICINE & REHABILITATION PROGRESS NOTE   Subjective/Complaints:  Still no bm. Wants to wait until she goes home. No new issues otherwise  ROS: Patient denies fever, rash, sore throat, blurred vision, nausea, vomiting, diarrhea, cough, shortness of breath or chest pain, joint or back pain, headache, or mood change.     Objective:   No results found. No results for input(s): WBC, HGB, HCT, PLT in the last 72 hours. No results for input(s): NA, K, CL, CO2, GLUCOSE, BUN, CREATININE, CALCIUM in the last 72 hours.  Intake/Output Summary (Last 24 hours) at 01/21/2019 0951 Last data filed at 01/21/2019 0749 Gross per 24 hour  Intake 840 ml  Output -  Net 840 ml     Physical Exam: Vital Signs Blood pressure (!) 145/55, pulse 67, temperature 98.2 F (36.8 C), resp. rate 20, height 5\' 4"  (1.626 m), weight 59.2 kg, SpO2 97 %.  Constitutional: No distress . Vital signs reviewed. HEENT: EOMI, oral membranes moist Neck: supple Cardiovascular: RRR without murmur. No JVD    Respiratory: CTA Bilaterally without wheezes or rales. Normal effort    GI: BS +, non-tender, non-distended  Musculoskeletal:Normal range of motion. no shoulder pain. Left thumb remains tender at MCP jt---has spica splint with k tape in place   Neurological: She isalert. And oriented x 3. Left C7. RUE and RLE 4/5. LUE trace  To 1/5 wrist,biceps, finger flexion in synergy pattern.  LLE remains 3+/5 prox to distal--stable exam.  Normal pain and LT in all 4's  Skin: Skin iswarmand dry. She isnot diaphoretic. Noerythema.  Psychiatric:pleasant   Assessment/Plan: 1. Functional deficits secondary to right MCA infarct which require 3+ hours per day of interdisciplinary therapy in a comprehensive inpatient rehab setting.  Physiatrist is providing close team supervision and 24 hour management of active medical problems listed below.  Physiatrist and rehab team continue to assess barriers to  discharge/monitor patient progress toward functional and medical goals  Care Tool:  Bathing    Body parts bathed by patient: Left arm, Abdomen, Chest, Front perineal area, Face, Right upper leg, Left upper leg, Right lower leg, Left lower leg, Buttocks   Body parts bathed by helper: Right arm     Bathing assist Assist Level: Contact Guard/Touching assist     Upper Body Dressing/Undressing Upper body dressing   What is the patient wearing?: Pull over shirt    Upper body assist Assist Level: Contact Guard/Touching assist    Lower Body Dressing/Undressing Lower body dressing      What is the patient wearing?: Underwear/pull up, Pants     Lower body assist Assist for lower body dressing: Contact Guard/Touching assist     Toileting Toileting    Toileting assist Assist for toileting: Contact Guard/Touching assist     Transfers Chair/bed transfer  Transfers assist     Chair/bed transfer assist level: Contact Guard/Touching assist     Locomotion Ambulation   Ambulation assist      Assist level: Contact Guard/Touching assist Assistive device: Walker-rolling Max distance: 50'   Walk 10 feet activity   Assist     Assist level: Contact Guard/Touching assist Assistive device: Walker-rolling   Walk 50 feet activity   Assist Walk 50 feet with 2 turns activity did not occur: Safety/medical concerns  Assist level: Contact Guard/Touching assist Assistive device: Walker-rolling    Walk 150 feet activity   Assist Walk 150 feet activity did not occur: Safety/medical concerns  Assist level: Moderate Assistance - Patient - 57 -  74% Assistive device: Walker-rolling    Walk 10 feet on uneven surface  activity   Assist Walk 10 feet on uneven surfaces activity did not occur: Safety/medical concerns         Wheelchair     Assist Will patient use wheelchair at discharge?: Yes Type of Wheelchair: Manual Wheelchair activity did not occur:  N/A  Wheelchair assist level: Supervision/Verbal cueing Max wheelchair distance: 150'    Wheelchair 50 feet with 2 turns activity    Assist    Wheelchair 50 feet with 2 turns activity did not occur: N/A   Assist Level: Supervision/Verbal cueing   Wheelchair 150 feet activity     Assist  Wheelchair 150 feet activity did not occur: N/A   Assist Level: Supervision/Verbal cueing   Blood pressure (!) 145/55, pulse 67, temperature 98.2 F (36.8 C), resp. rate 20, height 5\' 4"  (1.626 m), weight 59.2 kg, SpO2 97 %.  Medical Problem List and Plan: 1.Left-sided weaknesssecondary to recurrent right MCA infarct in setting of right ICA occlusion and severe left ICA stenosis.   -dc home today  -Patient to see Rehab MD/provider in the office for transitional care encounter in 1-2 weeks.    -left WHO at HS, AFO LLE per [facility]      2. Antithrombotics: -DVT/anticoagulation:Lovenox -antiplatelet therapy: Aspirin 325 mg daily and Plavix 75 mg daily 3. 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 Mood:Provide emotional support -antipsychotic agents: N/A 5. Neuropsych: This patientiscapable of making decisions on herown behalf. 6. Skin/Wound Care:Routine skin checks 7. Fluids/Electrolytes/Nutrition: encourage PO  -intake reasonable 8. Severe left internalcarotid stenosis. Follow-up outpatient vascular surgery Dr. Carlis Abbott to consider intervention 9. Hypertension. Norvasc 5 mg daily. Controlled at present 10. Diabetes mellitus. Hemoglobin A1c 10.3.  -sugars labile but showing improvement  -  AM lantus increased to 32 u qam effective today  - Dc'ed PM lantus 8/30  -pt is on heart healthy cm diet   CBG (last  3)  Recent Labs    01/20/19 2108 01/21/19 0602 01/21/19 0623  GLUCAP 213* 76 104*    -needs continued outpt monitoring  -recommend HS snack as well 11. Hyperlipidemia. Lipitor 12. Hypothyroidism. Continue Synthroid 13. Constipation:    Moved bowels 8/30--still no bm--hesitant to take stronger laxatives or supps/enemas    -team has provided education to patient  LOS: 18 days A FACE TO [place] 01/21/2019, 9:51 AM

## 2019-01-21 NOTE — Progress Notes (Signed)
Social Work Discharge Note   The overall goal for the admission was met for:   Discharge location: Yes - home with husband and other family members to assist intermittently.   Length of Stay: Yes - 18 days  Discharge activity level: Yes - Contact Guard Assist/Minimal Assistance  Home/community participation: Yes  Services provided included: MD, RD, PT, OT, RN, Pharmacy and SW  Financial Services: Private Insurance: [employer]  Follow-up services arranged: Home Health: PT/OT/CNA, DME: 16"x16" lightweight wheelchair with left lap tray and basic cushion; bedside commode; tub transfer bench; youth rolling walker and Patient/Family request agency HH: [facility], DME: [facility]  Comments (or additional information): Pt's husband and dtr came in for family education and are prepared to provide pt the care she needs at home.  Patient/Family verbalized understanding of follow-up arrangements: Yes  Individual responsible for coordination of the follow-up plan: Pt's husband, Danielle [name] [phone] and dtr, Danielle [name] [phone]  Confirmed correct DME delivered: Trey Sailors 01/21/2019    Deontre Allsup, Silvestre Mesi

## 2019-01-26 ENCOUNTER — Telehealth: Payer: Self-pay | Admitting: Registered Nurse

## 2019-01-26 NOTE — Telephone Encounter (Signed)
Transitional Care call  Patient name: Danielle [name]  DOB:  1. Are you/is patient experiencing any problems since coming home? No a. Are there any questions regarding any aspect of care? No 2. Are there any questions regarding medications administration/dosing? No a. Are meds being taken as prescribed? Yes b. "Patient should review meds with caller to confirm" Medication List Reviewed.  3. Have there been any falls? No 4. Has Home Health been to the house and/or have they contacted you? Yes, [facility]. a. If not, have you tried to contact them? NA b. Can we help you contact them? No 5. Are bowels and bladder emptying properly? Yes a. Are there any unexpected incontinence issues? No b. If applicable, is patient following bowel/bladder programs NA 6. Any fevers, problems with breathing, unexpected pain? No 7. Are there any skin problems or new areas of breakdown? No 8. Has the patient/family member arranged specialty MD follow up (ie cardiology/neurology/renal/surgical/etc.)?  Mr. Bess instructed to call Dr. Luan Pulling and [facility] to schedule HFU appointments.  a. Can we help arrange? No 9. Does the patient need any other services or support that we can help arrange? No 10. Are caregivers following through as expected in assisting the patient? Yes 11. Has the patient quit smoking, drinking alcohol, or using drugs as recommended? (                        )  Appointment date/time 02/02/2019  arrival time 12:40 for 1:00 appointment  With [facility] At [place]

## 2019-02-02 ENCOUNTER — Encounter: Payer: Medicare Other | Attending: Registered Nurse | Admitting: Registered Nurse

## 2019-02-02 ENCOUNTER — Other Ambulatory Visit: Payer: Self-pay

## 2019-02-02 ENCOUNTER — Encounter: Payer: Self-pay | Admitting: Registered Nurse

## 2019-02-02 VITALS — BP 132/72 | HR 71 | Temp 98.7°F | Ht 64.0 in

## 2019-02-02 DIAGNOSIS — I10 Essential (primary) hypertension: Secondary | ICD-10-CM | POA: Diagnosis present

## 2019-02-02 DIAGNOSIS — E1121 Type 2 diabetes mellitus with diabetic nephropathy: Secondary | ICD-10-CM

## 2019-02-02 DIAGNOSIS — I63511 Cerebral infarction due to unspecified occlusion or stenosis of right middle cerebral artery: Secondary | ICD-10-CM | POA: Diagnosis present

## 2019-02-02 DIAGNOSIS — I6523 Occlusion and stenosis of bilateral carotid arteries: Secondary | ICD-10-CM

## 2019-02-02 DIAGNOSIS — E1165 Type 2 diabetes mellitus with hyperglycemia: Secondary | ICD-10-CM | POA: Diagnosis present

## 2019-02-02 DIAGNOSIS — E039 Hypothyroidism, unspecified: Secondary | ICD-10-CM

## 2019-02-02 DIAGNOSIS — IMO0002 Reserved for concepts with insufficient information to code with codable children: Secondary | ICD-10-CM

## 2019-02-02 DIAGNOSIS — Z794 Long term (current) use of insulin: Secondary | ICD-10-CM

## 2019-02-02 DIAGNOSIS — E7849 Other hyperlipidemia: Secondary | ICD-10-CM | POA: Diagnosis present

## 2019-02-02 NOTE — Progress Notes (Signed)
Subjective:    Patient ID: Danielle [name], female    DOB: 1932/02/01, 85 y.o.   MRN: [id number]  HPI: Danielle [name] is a 85 y.o. 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 [name] presented to [facility] hospital on 12/23/2018 with left upper extremity weakness. Neurology was consulted.  She was discharged home on 12/27/2018.  CT Head: WO Contrast  IMPRESSION: 1. No evidence of an acute intracranial abnormality. 2. Stable size of left anterior parafalcine meningioma. 3. Chronic microvascular ischemic changes as well as prior right MCA distribution infarct and prior right basal ganglia lacunar infarcts.  US Carotid Bilateral:  IMPRESSION: Chronically occluded right ICA as before.  Moderate left ICA atherosclerosis. Left ICA stenosis estimated at 50-69%  Patent antegrade vertebral flow bilaterally  MR Brain WO Contrast:  IMPRESSION: 1. Multiple small acute right MCA infarcts. 2. Chronic right MCA infarct. 3. 3.7 cm left anterior parafalcine meningioma, not significantly changed in size from 2019 and without edema. 4. 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 Severely diminished flow in the proximal right MCA and possible interval occlusion of the right A1 segment.  EEG was Negative for Seizure. [name] was discharged home on 12/27/2018.   [name] was admitted from Dr. Luan Pulling office on 12/29/2018 to [facility] , she developed increased left- sided weakness. Neurology was consulted.  MR Brain WO Contrast: IMPRESSION: 1. Multiple new acute right MCA infarcts involving the frontal and parietal lobes. 2. Subacute and chronic infarcts as above. 3. Unchanged left para falcine meningioma. 4. Chronic occlusion of  the right ICA.  MR Cervical Spine WO Contrast:  IMPRESSION: 1. Advanced degenerative cervical spondylosis with multilevel disc disease and facet disease. 2. 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 3. Normal MR appearance of the cervical spinal cord. 4. Mild spinal and bilateral foraminal stenosis at C6-7. 5. Mild left foraminal stenosis at C3-4. 6. Mild bilateral foraminal stenosis at C7-T1.  CT Angio Neck W or WO Contrast:  IMPRESSION: 1. 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 - the Right ICA is thread-like distal to the severe stenosis, and occludes in the mid right siphon. - there is suboptimal reconstitution of the distal right ICA (see #2) and the right MCA and right ACA are patent but diminutive. - the left ICA maintains a more normal caliber distal to the severe stenosis. And the left anterior circulation is largely normal. 2. There is no vertebral or basilar artery stenosis, but the Right Pcomm is diminutive or absent. There is up to moderate right PCA stenosis. 3. Expected CT appearance of right MCA ischemia. No intracranial hemorrhage or mass effect. 4. Chronic left para falcine meningioma. 5. 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  Vascular Surgery was consulted recommended no surgery at this time will follow up outpatient. [name] will continue with ASA and Plavix.   [name] was admitted to inpatient rehabilitation on 01/03/2019 and discharged home on 01/21/2019. She is receiving out patient therapy with [facility].She denies any pain. She rated her pain 3. Also reports she has a good appetite.   Daughter in room all questions answered.    Pain Inventory Average Pain 5 Pain Right Now 3 My pain is dull  In the last 24 hours,  has pain interfered with the following? General activity 0 Relation with others 1 Enjoyment of life  2 What TIME of day is your pain at its worst? daytime Sleep (in general) Poor  Pain is worse with: unsure Pain improves with: medication Relief from Meds: 5  Mobility walk with assistance use a walker how many minutes can you walk? 2-3 ability to climb steps?  yes do you drive?  no transfers alone Do you have any goals in this area?  yes  Function not employed: date last employed . disabled: date disabled 2020 retired I need assistance with the following:  feeding, dressing, bathing, toileting, meal prep, household duties and shopping Do you have any goals in this area?  yes  Neuro/Psych bladder control problems weakness trouble walking dizziness confusion anxiety  Prior Studies Any changes since last visit?  no  Physicians involved in your care Any changes since last visit?  no   No family history on file. Social History   Socioeconomic History  . Marital status: Married    Spouse name: Not on file  . Number of children: Not on file  . Years of education: Not on file  . Highest education level: Not on file  Occupational History  . Not on file  Social Needs  . Financial resource strain: Patient refused  . Food insecurity    Worry: Patient refused    Inability: Patient refused  . Transportation needs    Medical: Patient refused    Non-medical: Patient refused  Tobacco Use  . Smoking status: Former Smoker    Packs/day: 1.00    Years: 40.00    Pack years: 40.00    Types: Cigarettes    Quit date: 1991    Years since quitting: 29.7  . Smokeless tobacco: Never Used  Substance and Sexual Activity  . Alcohol use: No  . Drug use: No  . Sexual activity: Not on file  Lifestyle  . Physical activity    Days per week: Patient refused    Minutes per session: Patient refused  . Stress: Patient refused  Relationships  . Social Herbalist on phone: Patient refused    Gets together: Patient refused    Attends religious service: Patient refused     Active member of club or organization: Patient refused    Attends meetings of clubs or organizations: Patient refused    Relationship status: Patient refused  Other Topics Concern  . Not on file  Social History Narrative  . Not on file   Past Surgical History:  Procedure Laterality Date  . AUGMENTATION MAMMAPLASTY Bilateral   . DILATION AND CURETTAGE OF UTERUS    . FRACTURE SURGERY    . REVERSE SHOULDER ARTHROPLASTY Right 05/26/2017  . REVERSE SHOULDER ARTHROPLASTY Right 05/26/2017   Procedure: REVERSE RIGHT SHOULDER ARTHROPLASTY;  Surgeon: Meredith Pel, MD;  Location: [place];  Service: Orthopedics;  Laterality: Right;  . TONSILLECTOMY    . TUBAL LIGATION     Past Medical History:  Diagnosis Date  . Arthritis    "some joint pain once in awhile" (05/26/2017)  . CVA (cerebral vascular accident) ([place]) 2019  . History of kidney stones   . Hyperlipemia   . Hypertension   . Hypothyroidism (acquired)   . Pneumothorax 02/05/2017   right-after fall  . Rib fractures 02/05/2017   right side  . Stroke ([place])   . Type II diabetes mellitus (HCC)    BP 132/72   Pulse 71  Temp 98.7 F (37.1 C)   Ht 5\' 4"  (1.626 m)   SpO2 95%   BMI 22.40 kg/m   Opioid Risk Score:   Fall Risk Score:  `1  Depression screen PHQ 2/9  No flowsheet data found.  Review of Systems  Constitutional: Negative.   HENT: Negative.   Eyes: Negative.   Respiratory: Negative.   Cardiovascular: Negative.   Gastrointestinal: Negative.   Endocrine: Negative.   Genitourinary: Negative.   Musculoskeletal: Positive for gait problem and joint swelling.  Skin: Negative.   Allergic/Immunologic: Negative.   Neurological: Positive for dizziness and weakness.  Hematological: Negative.   Psychiatric/Behavioral: Positive for confusion. The patient is nervous/anxious.   All other systems reviewed and are negative.      Objective:   Physical Exam Vitals signs and nursing note reviewed.  Constitutional:       Appearance: Normal appearance.  Neck:     Musculoskeletal: Normal range of motion and neck supple.  Cardiovascular:     Rate and Rhythm: Normal rate and regular rhythm.     Pulses: Normal pulses.     Heart sounds: Normal heart sounds.  Pulmonary:     Effort: Pulmonary effort is normal.  Musculoskeletal:     Comments: Normal Muscle Bulk and Muscle Testing Reveals:  Upper Extremities: Right: Full ROM and Muscle Strength 4/5 Left: Decreased ROM and Muscle Strength 1/5 wearing Left Thumb Spica Splint  Lower Extremities:Right  Full ROM and Muscle Strength 4/5 Left Lower Extremity Decreased ROM and Muscle Strength 4/5 wearing left weight ankle toe lift Arrived in wheelchair  Skin:    General: Skin is warm and dry.  Neurological:     Mental Status: She is alert and oriented to person, place, and time.  Psychiatric:        Mood and Affect: Mood normal.        Behavior: Behavior normal.           Assessment & Plan:  1. Right Middle Cerebral Artery Stroke: Continue with Outpatient Therapy with [facility]. She has a scheduled appointment with Neurology.  2. Bilateral Carotid Stenosis: Vascular Following. Continue to monitor.  3. Essential Hypertension: Continue current medication regimen. PCP Following.  4. Uncontrolled Type 2 DM with diabetic Neuropathy: Glucose controlled on 01/21/2019, Glucose 104. PCP Following.  5. Hyperlipidemia: Continue current medication regimen. PCP Following.  7. Hypothyroidism: Continue current medication regimen. PCP following.   90minutes of face to face patient care time was spent during this visit. All questions were encouraged and answered.   F/U with Dr Letta Pate in 4- 6 weeks

## 2019-02-07 ENCOUNTER — Other Ambulatory Visit: Payer: Self-pay

## 2019-02-07 DIAGNOSIS — I6523 Occlusion and stenosis of bilateral carotid arteries: Secondary | ICD-10-CM

## 2019-02-08 ENCOUNTER — Ambulatory Visit (HOSPITAL_COMMUNITY)
Admission: RE | Admit: 2019-02-08 | Discharge: 2019-02-08 | Disposition: A | Discharge status: Home / Self Care | Payer: Medicare Other | Source: Ambulatory Visit | Attending: Vascular Surgery | Admitting: Vascular Surgery

## 2019-02-08 ENCOUNTER — Ambulatory Visit: Payer: Medicare Other | Admitting: Vascular Surgery

## 2019-02-08 ENCOUNTER — Other Ambulatory Visit: Payer: Self-pay

## 2019-02-08 ENCOUNTER — Encounter: Payer: Self-pay | Admitting: Vascular Surgery

## 2019-02-08 VITALS — BP 114/59 | HR 75 | Temp 97.3°F | Resp 14 | Ht 65.0 in | Wt 132.0 lb

## 2019-02-08 DIAGNOSIS — I6523 Occlusion and stenosis of bilateral carotid arteries: Secondary | ICD-10-CM | POA: Diagnosis not present

## 2019-02-08 NOTE — Progress Notes (Signed)
Patient name: Danielle [name] MRN: [id number] DOB: [birth year] Sex: female  REASON FOR VISIT: Follow-up for carotid disease  HPI: Danielle [name] is a 85 y.o. female the presents for hospital follow-up for ongoing surveillance for carotid disease.  She was initially seen in the hospital for right-sided CVA with fairly profound left arm hemiparesis.  Vascular surgery was called for bilateral string signs of the ICAs.  On the right which was the side of interest she had a distal occlusion of the ICA in the carotid siphon and her duplex showed a thump signal.  As result I did not offer revascularization.  I did not think she had a high-grade left carotid stenosis instead a moderate stenosis on the left.  On follow-up today she states she has been doing relatively well.  She still has fairly profound weakness in the left arm.  She is working with therapy at home.  No new neurologic symptoms including numbness or weakness in her arm or leg different from her hospital discharge.  No vision loss.  Past Medical History:  Diagnosis Date  . Arthritis    "some joint pain once in awhile" (05/26/2017)  . CVA (cerebral vascular accident) ([place]) 2019  . History of kidney stones   . Hyperlipemia   . Hypertension   . Hypothyroidism (acquired)   . Pneumothorax 02/05/2017   right-after fall  . Rib fractures 02/05/2017   right side  . Stroke ([place])   . Type II diabetes mellitus ([place])     Past Surgical History:  Procedure Laterality Date  . AUGMENTATION MAMMAPLASTY Bilateral   . DILATION AND CURETTAGE OF UTERUS    . FRACTURE SURGERY    . REVERSE SHOULDER ARTHROPLASTY Right 05/26/2017  . REVERSE SHOULDER ARTHROPLASTY Right 05/26/2017   Procedure: REVERSE RIGHT SHOULDER ARTHROPLASTY;  Surgeon: Meredith Pel, MD;  Location: [place];  Service: Orthopedics;  Laterality: Right;  . TONSILLECTOMY    . TUBAL LIGATION      History reviewed. No pertinent family history.  SOCIAL HISTORY: Social  History   Tobacco Use  . Smoking status: Former Smoker    Packs/day: 1.00    Years: 40.00    Pack years: 40.00    Types: Cigarettes    Quit date: 1991    Years since quitting: 29.7  . Smokeless tobacco: Never Used  Substance Use Topics  . Alcohol use: No    Allergies  Allergen Reactions  . Fosamax [Alendronate Sodium] Other (See Comments)    MUSCLE ACHES  . Prednisone     Feels jittery and nauseous    Current Outpatient Medications  Medication Sig Dispense Refill  . acetaminophen (TYLENOL) 325 MG tablet Take 2 tablets (650 mg total) by mouth every 6 (six) hours as needed for mild pain (or Fever >/= 101).    Marland Kitchen amLODipine (NORVASC) 5 MG tablet Take 1 tablet (5 mg total) by mouth daily. 30 tablet 11  . aspirin 325 MG tablet Take 1 tablet (325 mg total) by mouth daily. 30 tablet 12  . atorvastatin (LIPITOR) 80 MG tablet Take 1 tablet (80 mg total) by mouth daily at 6 PM. 30 tablet 0  . Calcium Carb-Cholecalciferol (CALCIUM 600 + D PO) Take 1 tablet by mouth daily.    . clopidogrel (PLAVIX) 75 MG tablet Take 1 tablet (75 mg total) by mouth daily. 30 tablet 1  . diclofenac sodium (VOLTAREN) 1 % GEL Apply 2 g topically 4 (four) times daily. 2 g 0  . Insulin  Glargine (LANTUS) 100 UNIT/ML Solostar Pen Inject 35 Units into the skin daily. 15 mL 11  . Insulin Glargine, 2 Unit Dial, (TOUJEO MAX SOLOSTAR) 300 UNIT/ML SOPN Inject 20 Units into the skin.    . Insulin Lispro (HUMALOG KWIKPEN) 200 UNIT/ML SOPN Inject into the skin. 10-12 units BID    . Multiple Vitamins-Minerals (OCUVITE ADULT 50+ PO) Take by mouth.    . multivitamin (PROSIGHT) TABS tablet Take 1 tablet by mouth daily. 30 tablet 0  . senna-docusate (SENOKOT-S) 8.6-50 MG tablet Take 2 tablets by mouth at bedtime. (Patient taking differently: Take 2 tablets by mouth at bedtime as needed. )    . SYNTHROID 100 MCG tablet Take 1 tablet (100 mcg total) by mouth every morning. 30 tablet 0  . vitamin B-12 (CYANOCOBALAMIN) 1000 MCG  tablet Take 1 tablet (1,000 mcg total) by mouth daily. 30 tablet 0   No current facility-administered medications for this visit.     REVIEW OF SYSTEMS:  [X]  denotes positive finding, [ ]  denotes negative finding Cardiac  Comments:  Chest pain or chest pressure:    Shortness of breath upon exertion:    Short of breath when lying flat:    Irregular heart rhythm:        Vascular    Pain in calf, thigh, or hip brought on by ambulation:    Pain in feet at night that wakes you up from your sleep:     Blood clot in your veins:    Leg swelling:         Pulmonary    Oxygen at home:    Productive cough:     Wheezing:         Neurologic    Sudden weakness in arms or legs:     Sudden numbness in arms or legs:     Sudden onset of difficulty speaking or slurred speech:    Temporary loss of vision in one eye:     Problems with dizziness:         Gastrointestinal    Blood in stool:     Vomited blood:         Genitourinary    Burning when urinating:     Blood in urine:        Psychiatric    Major depression:         Hematologic    Bleeding problems:    Problems with blood clotting too easily:        Skin    Rashes or ulcers:        Constitutional    Fever or chills:      PHYSICAL EXAM: Vitals:   02/08/19 1328  BP: (!) 114/59  Pulse: 75  Resp: 14  Temp: (!) 97.3 F (36.3 C)  TempSrc: Temporal  SpO2: 98%  Weight: 132 lb (59.9 kg)  Height: 5\' 5"  (1.651 m)    GENERAL: The patient is a well-nourished female, in no acute distress. The vital signs are documented above. CARDIAC: There is a regular rate and rhythm.  PULMONARY: There is good air exchange bilaterally without wheezing or rales. ABDOMEN: Soft and non-tender with normal pitched bowel sounds.  MUSCULOSKELETAL: There are no major deformities or cyanosis. NEUROLOGIC: Barely able to lift left arm off wheelchair.  CN II-XII grossly intact.  Left leg 4/5 against gravity.  DATA:   Carotid duplex today suggest  her right ICA is now occluded  She has a moderate stenosis in the 60-79% range in  the left ICA  Assessment/Plan:  85 year old female the presents for ongoing follow-up after recent right MCA infarct.  As previously noted in the hospital consult we did not offer her carotid endarterectomy on the right given that there was evidence of a distal right carotid siphon occlusion and I did not think benefits of revascularization would outweigh the risks.  On duplex today her right ICA is now completely occluded down to bifurcation and has stable neuro exam..  Shes had no new neurologic symptoms and is at her baseline since discharge.  Discussed with her and her husband that now we would be doing surveillance on the left side which is the asymptomatic side.  She now has a moderate stenosis on the left.  Discussed we would offer surgery if this progresses to more than 80%.  I will see her again in 6 months with another carotid duplex.   Marty Heck, MD [facility] Office: [phone] Pager: [phone]

## 2019-02-22 ENCOUNTER — Encounter: Payer: Self-pay | Admitting: Neurology

## 2019-02-22 ENCOUNTER — Other Ambulatory Visit: Payer: Self-pay

## 2019-02-22 ENCOUNTER — Ambulatory Visit: Payer: Medicare Other | Admitting: Neurology

## 2019-02-22 VITALS — BP 123/59 | HR 72 | Temp 96.9°F | Ht 65.0 in

## 2019-02-22 DIAGNOSIS — D320 Benign neoplasm of cerebral meninges: Secondary | ICD-10-CM

## 2019-02-22 DIAGNOSIS — G8194 Hemiplegia, unspecified affecting left nondominant side: Secondary | ICD-10-CM | POA: Diagnosis not present

## 2019-02-22 DIAGNOSIS — I63231 Cerebral infarction due to unspecified occlusion or stenosis of right carotid arteries: Secondary | ICD-10-CM

## 2019-02-22 NOTE — Patient Instructions (Signed)
I had a long d/w patient and her husband about her recent stroke, right carotid occlusion and left carotid stenosis,risk for recurrent stroke/TIAs, personally independently reviewed imaging studies and stroke evaluation results and answered questions.Continue aspirin and Plavix for 2 more months and then discontinue aspirin and stay on Plavix alone for secondary stroke prevention and maintain strict control of hypertension with blood pressure goal below 130/90, diabetes with hemoglobin A1c goal below 6.5% and lipids with LDL cholesterol goal below 70 mg/dL. I also advised the patient to eat a healthy diet with plenty of whole grains, cereals, fruits and vegetables, exercise regularly and maintain ideal body weight.  Continue home physical and occupational therapy and subsequently to finishing this she may also need outpatient therapies.  We may also consider future referral to [facility] for magnetic stimulation if necessary.  Continue conservative follow-up for her meningioma as it is asymptomatic at the present time and it will need follow-up MRIs at 1-2-year intervals. followup in the future with my nurse practitioner Janett Billow in 3 months or call earlier if necessary.  Stroke Prevention Some medical conditions and behaviors are associated with a higher chance of having a stroke. You can help prevent a stroke by making nutrition, lifestyle, and other changes, including managing any medical conditions you may have. What nutrition changes can be made?   Eat healthy foods. You can do this by: ? Choosing foods high in fiber, such as fresh fruits and vegetables and whole grains. ? Eating at least 5 or more servings of fruits and vegetables a day. Try to fill half of your plate at each meal with fruits and vegetables. ? Choosing lean protein foods, such as lean cuts of meat, poultry without skin, fish, tofu, beans, and nuts. ? Eating low-fat dairy products. ? Avoiding foods that are high in salt  (sodium). This can help lower blood pressure. ? Avoiding foods that have saturated fat, trans fat, and cholesterol. This can help prevent high cholesterol. ? Avoiding processed and premade foods.  Follow your health care provider's specific guidelines for losing weight, controlling high blood pressure (hypertension), lowering high cholesterol, and managing diabetes. These may include: ? Reducing your daily calorie intake. ? Limiting your daily sodium intake to 1,500 milligrams (mg). ? Using only healthy fats for cooking, such as olive oil, canola oil, or sunflower oil. ? Counting your daily carbohydrate intake. What lifestyle changes can be made?  Maintain a healthy weight. Talk to your health care provider about your ideal weight.  Get at least 30 minutes of moderate physical activity at least 5 days a week. Moderate activity includes brisk walking, biking, and swimming.  Do not use any products that contain nicotine or tobacco, such as cigarettes and e-cigarettes. If you need help quitting, ask your health care provider. It may also be helpful to avoid exposure to secondhand smoke.  Limit alcohol intake to no more than 1 drink a day for nonpregnant women and 2 drinks a day for men. One drink equals 12 oz of beer, 5 oz of wine, or 1 oz of hard liquor.  Stop any illegal drug use.  Avoid taking birth control pills. Talk to your health care provider about the risks of taking birth control pills if: ? You are over 38 years old. ? You smoke. ? You get migraines. ? You have ever had a blood clot. What other changes can be made?  Manage your cholesterol levels. ? Eating a healthy diet is important for preventing high  cholesterol. If cholesterol cannot be managed through diet alone, you may also need to take medicines. ? Take any prescribed medicines to control your cholesterol as told by your health care provider.  Manage your diabetes. ? Eating a healthy diet and exercising regularly are  important parts of managing your blood sugar. If your blood sugar cannot be managed through diet and exercise, you may need to take medicines. ? Take any prescribed medicines to control your diabetes as told by your health care provider.  Control your hypertension. ? To reduce your risk of stroke, try to keep your blood pressure below 130/80. ? Eating a healthy diet and exercising regularly are an important part of controlling your blood pressure. If your blood pressure cannot be managed through diet and exercise, you may need to take medicines. ? Take any prescribed medicines to control hypertension as told by your health care provider. ? Ask your health care provider if you should monitor your blood pressure at home. ? Have your blood pressure checked every year, even if your blood pressure is normal. Blood pressure increases with age and some medical conditions.  Get evaluated for sleep disorders (sleep apnea). Talk to your health care provider about getting a sleep evaluation if you snore a lot or have excessive sleepiness.  Take over-the-counter and prescription medicines only as told by your health care provider. Aspirin or blood thinners (antiplatelets or anticoagulants) may be recommended to reduce your risk of forming blood clots that can lead to stroke.  Make sure that any other medical conditions you have, such as atrial fibrillation or atherosclerosis, are managed. What are the warning signs of a stroke? The warning signs of a stroke can be easily remembered as BEFAST.  B is for balance. Signs include: ? Dizziness. ? Loss of balance or coordination. ? Sudden trouble walking.  E is for eyes. Signs include: ? A sudden change in vision. ? Trouble seeing.  F is for face. Signs include: ? Sudden weakness or numbness of the face. ? The face or eyelid drooping to one side.  A is for arms. Signs include: ? Sudden weakness or numbness of the arm, usually on one side of the body.   S is for speech. Signs include: ? Trouble speaking (aphasia). ? Trouble understanding.  T is for time. ? These symptoms may represent a serious problem that is an emergency. Do not wait to see if the symptoms will go away. Get medical help right away. Call your local emergency services (911 in the [place] Do not drive yourself to the hospital.  Other signs of stroke may include: ? A sudden, severe headache with no known cause. ? Nausea or vomiting. ? Seizure. Where to find more information For more information, visit:  American Stroke Association: www.strokeassociation.org  National Stroke Association: www.stroke.org Summary  You can prevent a stroke by eating healthy, exercising, not smoking, limiting alcohol intake, and managing any medical conditions you may have.  Do not use any products that contain nicotine or tobacco, such as cigarettes and e-cigarettes. If you need help quitting, ask your health care provider. It may also be helpful to avoid exposure to secondhand smoke.  Remember BEFAST for warning signs of stroke. Get help right away if you or a loved one has any of these signs. This information is not intended to replace advice given to you by your health care provider. Make sure you discuss any questions you have with your health care provider. Document Released: 06/12/2004 Document Revised:  04/17/2017 Document Reviewed: 06/10/2016 Elsevier Patient Education  [place].  Meningioma Meningioma is a tumor that occurs in the thin tissue that covers the brain and spinal cord (meninges). Meningiomas are usually not cancerous (benign) and do not spread to other areas. In rare cases, a meningioma may become cancerous (malignant). What are the causes? In many cases, the cause of this condition is not known. In some cases, meningioma may be caused by:  Having a genetic disorder that causes multiple soft tumors (neurofibromatosis 2).  A change in certain genes (genetic  mutation). What increases the risk? You are more likely to develop this condition if:  You have been exposed to radiation.  You are an older woman. Older women have a higher risk of meningiomas than men or children. However, men have a higher risk of malignant meningiomas.  You have injured your head in the past.  You have a history of breast cancer. What are the signs or symptoms? Symptoms of this condition usually begin very slowly. The symptoms may depend on the size and location of the tumor. Possible symptoms include:  Headaches.  Nausea and vomiting.  Vision changes.  Hearing changes.  Loss of the sense of smell.  Fits of uncontrolled movements (seizures).  Weakness or numbness on one side of the body or in an arm or leg.  Mood or personality changes.  Problems with memory or thinking. How is this diagnosed? This condition is diagnosed based on:  Results of brain imaging tests, such as a CT scan or MRI.  Removal and testing of a sample of the tumor (biopsy). This may be done to confirm the diagnosis and to help determine the best treatment for the condition. How is this treated? You may not have treatment until your symptoms start to affect your daily activities. This is because meningioma grows so slowly, and your health care provider may prefer to monitor its growth before starting treatment. If you do need treatment, it may include:  Medicines to decrease brain swelling and improve symptoms (steroids).  High-energy rays (radiation therapy) to shrink or kill the tumor.  Anti-cancer medicines (chemotherapy) to shrink or kill the tumor. Chemotherapy has many side effects because it also kills healthy cells.  Targeted therapy. This kills cancerous cells without affecting normal cells.  Surgery to remove as much of the tumor as possible. Follow these instructions at home:   Take over-the-counter and prescription medicines only as told by your health care  provider.  Keep all follow-up visits as told by your health care provider. This is important. You may need regular visits to monitor the growth of your tumor. Contact a health care provider if:  You have symptoms that come back.  You have diarrhea.  You vomit.  You have abdominal pain.  You cannot eat or drink as much as you need.  You are weaker or more tired than usual.  You are losing weight without trying. Get help right away if:  Your diarrhea, vomiting, or abdominal pain does not go away.  You have new symptoms, such as vision problems or difficulty walking.  You have a seizure.  You have bleeding that does not stop.  You have trouble breathing.  You have a fever. Summary  Meningioma is a tumor that occurs in the thin tissue that covers the brain and spinal cord (meninges).  Meningiomas are usually benign, which means they are not cancerous and do not spread to other areas.  Symptoms of this condition usually  begin very slowly. The symptoms may depend on the size and location of the tumor.  Your tumor may be monitored over time. You may not need treatment until your tumor starts to affect your daily life. This information is not intended to replace advice given to you by your health care provider. Make sure you discuss any questions you have with your health care provider. Document Released: 05/10/2013 Document Revised: 04/17/2017 Document Reviewed: 05/09/2016 Elsevier Patient Education  2020 [employer].

## 2019-02-22 NOTE — Progress Notes (Signed)
[facility] [address] [place]. [place] [address] [phone]       OFFICE CONSULT NOTE  [name] Date of Birth:  07-Nov-1931 Medical Record Number:  [id number]   Referring MD: Rosalin Hawking  Reason for Referral: Stroke  HPI: Danielle [name] is 85 year old pleasant Caucasian lady who is seen today for initial office consultation visit.  She is accompanied by her daughter.  History is obtained from them, review of electronic medical records and I personally reviewed imaging films in PACS.  [name] has past medical history of hypothyroidism, hypertension, hyperlipidemia, diabetes previous stroke and 05/03/2018 as well as 12/23/2018 for right MCA stroke with right carotid occlusion.  She presented to [facility] with worsening left-sided weakness and gait imbalance and MRI scan showed acute right MCA infarct involving frontal and parietal lobes.  She was seen by Dr. Merlene Laughter and CT angiogram of the head and neck showed bilateral string sign involving both carotid artery origins due to bulky calcification patient was transferred to [facility] for consultation for possible revascularization.  On exam she had subtle left facial weakness and significant left upper extremity paralysis and mild left leg weakness.  MRI scan confirmed new right MCA infarct involving frontal and parietal lobes as well as subacute to chronic right MCA infarcts as well.  There is stable appearance of left parafalcine meningioma and chronic right ICA occlusion.  2D echo showed normal ejection fraction.  LDL cholesterol was 97 mg percent.  Hemoglobin A1c was 10.3.  Vascular surgery was consulted for consideration for revascularization of the left carotid and his right carotid occlusion was felt to be chronic.  However carotid ultrasound showed only  60 to 79% left ICA stenosis and chronic right carotid occlusion.  Patient was seen for outpatient follow-up by Dr. Carlis Abbott last week who  recommended conservative medical management with aggressive risk factor modification and close follow-up for left carotid stenosis and not to revascularize unless the stenosis increased to 80%.  Patient is presently living at home.  She is not opted much improvement in the left upper extremity though she is left lower extremity strength is improved now she is able to walk with a cane and a walker.  She is currently getting physical and occupational therapy.  She is tolerating aspirin and Plavix without bruising or bleeding.  She states her blood pressure is under good control and today it is 123/53.  She has had no neurological issues since admission in the hospital. History of Stroke  12/23/2018 - L arm weakness. Tele neuro consulted in ED. MRI showed small R MCA acute and chronic infarcts. MRA head right MCA decrease flow. EEG neg. CUS showed R ICA occluded, L ICA 50-69%. EF 60-65%, A1C 10.3 and LDL 97. Still with weakness at d/c, hp improving.  refused SNF. D/c home on ASA 325, lipitor and fish oil.  04/2018 - left arm weakness, confusion, and difficulty walking - R frontal and parietal lobe infarcts in setting of R ICA occlusion. MRA head and neck right ICA occlusion, left ICA 50% stenosis and b/l P2 severe stenosis. CUS right ICA occlusion and left 50-69% stenosis. EF 65-70%. A1C 7.9 and LDL 163. D/c on aspirin and plavix. Statin added. [facility])  ROS:   14 system review of systems is positive for weakness, difficulty walking, gait difficulty all other systems negative  PMH:  Past Medical History:  Diagnosis Date   Arthritis    "some joint pain once  in awhile" (05/26/2017)   CVA (cerebral vascular accident) ([place]) 2019   History of kidney stones    Hyperlipemia    Hypertension    Hypothyroidism (acquired)    Pneumothorax 02/05/2017   right-after fall   Rib fractures 02/05/2017   right side   Stroke ([place])    Type II diabetes mellitus ([place])     Social History:    Social History   Socioeconomic History   Marital status: Married    Spouse name: Not on file   Number of children: Not on file   Years of education: Not on file   Highest education level: Not on file  Occupational History   Not on file  Social Needs   Financial resource strain: Patient refused   Food insecurity    Worry: Patient refused    Inability: Patient refused   Transportation needs    Medical: Patient refused    Non-medical: Patient refused  Tobacco Use   Smoking status: Former Smoker    Packs/day: 1.00    Years: 40.00    Pack years: 40.00    Types: Cigarettes    Quit date: 1991    Years since quitting: 29.7   Smokeless tobacco: Never Used  Substance and Sexual Activity   Alcohol use: No   Drug use: No   Sexual activity: Not on file  Lifestyle   Physical activity    Days per week: Patient refused    Minutes per session: Patient refused   Stress: Patient refused  Relationships   Social connections    Talks on phone: Patient refused    Gets together: Patient refused    Attends religious service: Patient refused    Active member of club or organization: Patient refused    Attends meetings of clubs or organizations: Patient refused    Relationship status: Patient refused   Intimate partner violence    Fear of current or ex partner: Patient refused    Emotionally abused: Patient refused    Physically abused: Patient refused    Forced sexual activity: Patient refused  Other Topics Concern   Not on file  Social History Narrative   Not on file    Medications:   Current Outpatient Medications on File Prior to Visit  Medication Sig Dispense Refill   acetaminophen (TYLENOL) 325 MG tablet Take 2 tablets (650 mg total) by mouth every 6 (six) hours as needed for mild pain (or Fever >/= 101).     amLODipine (NORVASC) 5 MG tablet Take 1 tablet (5 mg total) by mouth daily. 30 tablet 11   aspirin 325 MG tablet Take 1 tablet (325 mg total) by  mouth daily. 30 tablet 12   atorvastatin (LIPITOR) 80 MG tablet Take 1 tablet (80 mg total) by mouth daily at 6 PM. 30 tablet 0   Calcium Carb-Cholecalciferol (CALCIUM 600 + D PO) Take 1 tablet by mouth daily.     Calcium Carbonate-Vitamin D (CALCIUM-VITAMIN D3 PO) Take 600 mg by mouth.     diclofenac sodium (VOLTAREN) 1 % GEL Apply 2 g topically 4 (four) times daily. 2 g 0   Insulin Glargine, 2 Unit Dial, (TOUJEO MAX SOLOSTAR) 300 UNIT/ML SOPN Inject 20 Units into the skin.     Insulin Lispro (HUMALOG KWIKPEN) 200 UNIT/ML SOPN Inject into the skin. 10-12 units BID     Multiple Vitamins-Minerals (OCUVITE ADULT 50+ PO) Take by mouth.     multivitamin (PROSIGHT) TABS tablet Take 1 tablet by mouth daily. 30 tablet  0   senna-docusate (SENOKOT-S) 8.6-50 MG tablet Take 2 tablets by mouth at bedtime. (Patient taking differently: Take 2 tablets by mouth at bedtime as needed. )     SYNTHROID 100 MCG tablet Take 1 tablet (100 mcg total) by mouth every morning. 30 tablet 0   vitamin B-12 (CYANOCOBALAMIN) 1000 MCG tablet Take 1 tablet (1,000 mcg total) by mouth daily. 30 tablet 0   No current facility-administered medications on file prior to visit.     Allergies:   Allergies  Allergen Reactions   Fosamax [Alendronate Sodium] Other (See Comments)    MUSCLE ACHES   Prednisone     Feels jittery and nauseous    Physical Exam General: Frail elderly Caucasian lady seated, in no evident distress Head: head normocephalic and atraumatic.   Neck: supple with soft bilateral carotid bruits. Cardiovascular: regular rate and rhythm, no murmurs Musculoskeletal: no deformity Skin:  no rash/petichiae Vascular:  Normal pulses all extremities  Neurologic Exam Mental Status: Awake and fully alert. Oriented to place and time. Recent and remote memory intact. Attention span, concentration and fund of knowledge appropriate. Mood and affect appropriate.  Cranial Nerves: Fundoscopic exam reveals sharp  disc margins. Pupils equal, briskly reactive to light. Extraocular movements full without nystagmus. Visual fields full to confrontation. Hearing intact. Facial sensation intact. Face, tongue, palate moves normally and symmetrically.  Motor: Left hemiplegia with left upper extremity 1/5 strength left lower extremity 4/5 strength.  There is mild left foot drop and she is wearing ankle brace. Sensory.: intact to touch , pinprick , position and vibratory sensation.  Coordination: Rapid alternating movements normal in all extremities. Finger-to-nose and heel-to-shin performed accurately bilaterally. Gait and Station: Deferred as patient is in a wheelchair Reflexes: 2+ and asymmetric and brisker on the left.. Toes downgoing.   NIHSS  4 Modified Rankin  2   ASSESSMENT: 85 year old Caucasian lady with right hemispheric infarct secondary to symptomatic right carotid chronic occlusion and moderate left carotid asymptomatic stenosis.  Multiple vascular risk factors of carotid disease, hypertension, hyperlipidemia diabetes.  She also has incidental left frontal parasagittal meningioma which is asymptomatic.     PLAN: I had a long d/w patient and her husband about her recent stroke, right carotid occlusion and left carotid stenosis,risk for recurrent stroke/TIAs, personally independently reviewed imaging studies and stroke evaluation results and answered questions.Continue aspirin and Plavix for 2 more months and then discontinue aspirin and stay on Plavix alone for secondary stroke prevention and maintain strict control of hypertension with blood pressure goal below 130/90, diabetes with hemoglobin A1c goal below 6.5% and lipids with LDL cholesterol goal below 70 mg/dL. I also advised the patient to eat a healthy diet with plenty of whole grains, cereals, fruits and vegetables, exercise regularly and maintain ideal body weight.  Continue home physical and occupational therapy and subsequently to finishing this  she may also need outpatient therapies.  We may also consider future referral to [facility] for magnetic stimulation if necessary.  Continue conservative follow-up for her meningioma as it is asymptomatic at the present time and it will need follow-up MRIs at 1-2-year intervals.  Greater than 50% time during this 45-minute consultation visit was spent on counseling and coordination of care about her stroke, carotid occlusion and stenosis discussion about prevention and treatment plan and answering questions followup in the future with my nurse practitioner Janett Billow in 3 months or call earlier if necessary.  Note: This document was prepared with digital dictation and possible smart  Company secretary. Any transcriptional errors that result from this process are unintentional.

## 2019-02-28 ENCOUNTER — Ambulatory Visit (HOSPITAL_COMMUNITY): Payer: Medicare Other | Attending: Pulmonary Disease

## 2019-02-28 ENCOUNTER — Other Ambulatory Visit: Payer: Self-pay

## 2019-02-28 ENCOUNTER — Encounter (HOSPITAL_COMMUNITY): Payer: Self-pay

## 2019-02-28 DIAGNOSIS — R278 Other lack of coordination: Secondary | ICD-10-CM | POA: Diagnosis present

## 2019-02-28 DIAGNOSIS — R29898 Other symptoms and signs involving the musculoskeletal system: Secondary | ICD-10-CM | POA: Diagnosis present

## 2019-02-28 DIAGNOSIS — M25612 Stiffness of left shoulder, not elsewhere classified: Secondary | ICD-10-CM | POA: Insufficient documentation

## 2019-02-28 NOTE — Therapy (Signed)
[address], [place], [address] Phone: [phone]   Fax:  [phone]  Occupational Therapy Evaluation  Patient Details  Name: Danielle [name] MRN: [id number] Date of Birth: 10/10/1931 Referring Provider (OT): Dr. Sinda Du   Encounter Date: 02/28/2019  OT End of Session - 02/28/19 1545    Visit Number  1    Number of Visits  16    Date for OT Re-Evaluation  04/25/19   mini reassess: 03/28/19   Authorization Type  [employer] Medicare Copay $35 No visit limit.    Authorization Time Period  progress note at visit 10    Authorization - Visit Number  1    Authorization - Number of Visits  10    OT Start Time  [id number]    OT Stop Time  1520    OT Time Calculation (min)  47 min    Activity Tolerance  Patient tolerated treatment well    Behavior During Therapy  WFL for tasks assessed/performed       Past Medical History:  Diagnosis Date  . Arthritis    "some joint pain once in awhile" (05/26/2017)  . CVA (cerebral vascular accident) ([place]) 2019  . History of kidney stones   . Hyperlipemia   . Hypertension   . Hypothyroidism (acquired)   . Pneumothorax 02/05/2017   right-after fall  . Rib fractures 02/05/2017   right side  . Stroke ([place])   . Type II diabetes mellitus ([place])     Past Surgical History:  Procedure Laterality Date  . AUGMENTATION MAMMAPLASTY Bilateral   . DILATION AND CURETTAGE OF UTERUS    . FRACTURE SURGERY    . REVERSE SHOULDER ARTHROPLASTY Right 05/26/2017  . REVERSE SHOULDER ARTHROPLASTY Right 05/26/2017   Procedure: REVERSE RIGHT SHOULDER ARTHROPLASTY;  Surgeon: Meredith Pel, MD;  Location: [facility];  Service: Orthopedics;  Laterality: Right;  . TONSILLECTOMY    . TUBAL LIGATION      There were no vitals filed for this visit.  Subjective Assessment - 02/28/19 1443    Subjective   S: I want to be able to move this arm more.    Pertinent History  Patient is a 85 y/o female S/P right  MCA infarction in the setting of right ICA occlusion and severe left ICA stenosis. Patient has a history of a CVA in 04/2018 with her most recent CVA occuring on 12/23/18 in which she was transferred from [facility] to [facility] for treatment. She received therapy services in acute care then was admitted to inpatient rehab (01/03/19-01/21/19). She was discharged and received Home health therapy which she completed last week. Dr. Luan Pulling has referred patient to occupational therapy for evaluation and treatment.    Special Tests  FOTO - complete next session.    Patient Stated Goals  To be able to use her arm and move it more.    Currently in Pain?  No/denies        [facility] OT Assessment - 02/28/19 1443      Assessment   Medical Diagnosis  Left arm weakness    Referring Provider (OT)  Dr. Sinda Du    Onset Date/Surgical Date  --   multiple CVAs: 04/2018 & 12/23/18   Hand Dominance  Right    Next MD Visit  None scheduled     Prior Therapy  Pt received Acute OT and PT, CIR at [facility], and Home health therapy OT and PT  which finished last week.       Precautions   Precautions  Fall;Other (comment)    Precaution Comments  left hemiparesis    Required Braces or Orthoses  Other Brace/Splint    Other Brace/Splint  night time resting hand splint. thumb brace for extension during the day       Restrictions   Weight Bearing Restrictions  No      Balance Screen   Has the patient fallen in the past 6 months  No      Home  Environment   Family/patient expects to be discharged to:  Private residence    Living Arrangements  Spouse/significant other    Type of [place]    Additional Comments  Daughter comes over to assist with arm exercises.       Prior Function   Level of Independence  Independent    Vocation  Retired      ADL   ADL comments  Pt is able to feed herself with her right hand. Her daughter assists with baths twice a week. He provides moderate assistance for dressing.        Mobility   Mobility Status  Independent      Written Expression   Dominant Hand  Right      Vision - History   Baseline Vision  No visual deficits    Additional Comments  pt reports no change in vision      Cognition   Overall Cognitive Status  Within Functional Limits for tasks assessed      Observation/Other Assessments   Skin Integrity  Swelling noted in left hand and wrist. Kinesiotape applied to left wrist and hand from Home health therapist. Pt reports it is for sensation issues. Later in the evaluation, patient then mentions that the tape is for swelling.  Taping technique is related to swelling.     Focus on Therapeutic Outcomes (FOTO)   Completed next session      Sensation   Light Touch  Appears Intact    Hot/Cold  Appears Intact    Additional Comments  Patient reports that sensation feels the same in both UEs upon gross assessment. A formal assessment will be completed later if needed.       Coordination   Gross Motor Movements are Fluid and Coordinated  No    Fine Motor Movements are Fluid and Coordinated  No    9 Hole Peg Test  --   Unable to test left hand.    Box and Blocks  unable to test left hand.     Tremors  Tremors noted in left hand/wrist when passively stretched into extension. Tremors subside once fingers are fully extended.     Other  patient is able to make a fist and squeeze although unable to register grip strength with Goniometer.       Edema   Edema  Measure if needed. Assess need of edema glove versus kinesiotape for better carry over at home.       ROM / Strength   AROM / PROM / Strength  PROM;AROM;Strength      Palpation   Palpation comment  Patient has a 1 finger subluxation.       AROM   Overall AROM Comments  No active scapular movement noted during elevation and retraction. Patient able to completed some movement in LUE which was measured seated.     AROM Assessment Site  Wrist;Elbow;Forearm    Right/Left Elbow  Left  Left  Elbow Flexion  30    Left Elbow Extension  --   unable   Right/Left Forearm  Left    Left Forearm Pronation  --   unable   Left Forearm Supination  74 Degrees    Right/Left Wrist  Left    Left Wrist Extension  0 Degrees    Left Wrist Flexion  30 Degrees      PROM   Overall PROM Comments  Able to passively stretch shoulder to 90 degrees while seated. Full P/ROM in elbow,. Wrist has functional P/ROM for flexion, 50% for extension.  Full supination and pronation. Able to stretch all digits of hands in to extension and flexion.       Strength   Strength Assessment Site  Hand;Shoulder;Elbow;Wrist    Right/Left Shoulder  Left    Left Shoulder Flexion  1/5    Left Shoulder Extension  1/5    Left Shoulder ABduction  1/5    Left Shoulder Internal Rotation  1/5    Left Shoulder External Rotation  1/5    Right/Left Elbow  Left    Left Elbow Flexion  3-/5    Left Elbow Extension  0/5    Right/Left Wrist  Left    Left Wrist Flexion  3/5    Left Wrist Extension  3-/5    Right/Left hand  Left;Right    Right Hand Grip (lbs)  35    Right Hand Lateral Pinch  10 lbs    Right Hand 3 Point Pinch  8 lbs    Left Hand Grip (lbs)  0    Left Hand Lateral Pinch  2 lbs    Left Hand 3 Point Pinch  0 lbs                      OT Education - 02/28/19 1544    Education Details  weightbearing seated and standing. Shoulder shrugs 2-3 times a day in front a mirror 10X. Pt may continue previous exercises from therapy: LUE stretches.    Person(s) Educated  Patient;Spouse    Methods  Explanation;Handout;Demonstration;Verbal cues    Comprehension  Verbalized understanding       OT Short Term Goals - 02/28/19 1554      OT SHORT TERM GOAL #1   Title  Patient and family will be provided and educated on HEP as well as any equipment or adaptive strategies to help aid with increasing the functional use of her LUE during ADL tasks.    Time  4    Period  Weeks    Status  New    Target Date   03/28/19      OT SHORT TERM GOAL #2   Title  Patient will increase LUE P/ROM to [facility] in order to increase ability to complete dressing tasks.    Time  4    Period  Weeks    Status  New      OT SHORT TERM GOAL #3   Title  Patient will increase her left hand grip strength to 10# and her pinch to 5# in order to use her left hand to stabilize an object such as a cup or plate while eating.    Time  4    Period  Weeks    Status  New      OT SHORT TERM GOAL #4   Title  Patient will demonstrate/report the ability to use her LUE as a stabilizer during a basic  daily task such as during meals or grooming.    Time  4    Period  Weeks    Status  New        OT Long Term Goals - 02/28/19 1558      OT LONG TERM GOAL #1   Title  patient will demonstrate the ability to use her left UE as an active assist during daily tasks.    Time  8    Period  Weeks    Status  New    Target Date  04/25/19      OT LONG TERM GOAL #2   Title  Patient will increase her LUE A/ROM to allow her to complete functional tasks at waist level with less difficulty.    Time  8    Period  Weeks    Status  New      OT LONG TERM GOAL #3   Title  Patient will increase her left hand strength to 15# and her pinch strength to 8# in order to hold on to items with greater stability and less dropping.    Time  8    Period  Weeks    Status  New      OT LONG TERM GOAL #4   Title  Patient will demonstrate increased coordination by completing the 9 hole peg test with therapist providing set up of pegs.    Time  8    Period  Weeks    Status  New      OT LONG TERM GOAL #5   Title  Pt will improve her LUE strength to 3/5 in order to complete low level functional reaching tasks while at home requiring greater independence.    Time  8    Period  Weeks    Status  New            Plan - 02/28/19 1548    Clinical Impression Statement  A: Patient is a 85 y/o female S/P Left side weakness sustained from a CVA causing  decreased ROM, strength, coordination and increased edema resulting in difficulty using her LUE for any daily or leisure task.    OT Occupational Profile and History  Problem Focused Assessment - Including review of records relating to presenting problem    Occupational performance deficits (Please refer to evaluation for details):  ADL's;IADL's;Leisure    Body Structure / Function / Physical Skills  Balance;ADL;UE functional use;Fascial restriction;Flexibility;FMC;Proprioception;ROM;Coordination;GMC;Sensation;Decreased knowledge of use of DME;IADL;Strength;Edema;Tone    Rehab Potential  Excellent    Clinical Decision Making  Several treatment options, min-mod task modification necessary    Comorbidities Affecting Occupational Performance:  May have comorbidities impacting occupational performance    Modification or Assistance to Complete Evaluation   Min-Moderate modification of tasks or assist with assess necessary to complete eval    OT Frequency  2x / week    OT Duration  8 weeks    OT Treatment/Interventions  Self-care/ADL training;Ultrasound;Compression bandaging;Patient/family education;DME and/or AE instruction;Passive range of motion;Balance training;Cryotherapy;Electrical Stimulation;Moist Heat;Neuromuscular education;Therapeutic activities;Manual Therapy;Therapeutic exercise;Functional Mobility Training;Splinting    Plan  P: Patient will benefit from skilled OT services to increase functional performance and increase ability to use her LUE more during basic ADL tasks. Treatment Plan: Pt to bring in braces/splints next session. Complete FOTO. Update HEP as needed. Provide any additional HEP next session for hand (ie. squeezing towel, etc). NM re-ed, weightbearing, NMES, coordination, grip and pinch strengthening, P/ROM, AA/ROM, A/ROM.    Consulted and Agree  with Plan of Care  Patient;Family member/caregiver    Family Member Consulted  Husband: [name]       Patient will benefit from  skilled therapeutic intervention in order to improve the following deficits and impairments:   Body Structure / Function / Physical Skills: Balance, ADL, UE functional use, Fascial restriction, Flexibility, FMC, Proprioception, ROM, Coordination, GMC, Sensation, Decreased knowledge of use of DME, IADL, Strength, Edema, Tone       Visit Diagnosis: Other symptoms and signs involving the musculoskeletal system - Plan: Ot plan of care cert/re-cert  Other lack of coordination - Plan: Ot plan of care cert/re-cert  Stiffness of left shoulder, not elsewhere classified - Plan: Ot plan of care cert/re-cert    Problem List Patient Active Problem List   Diagnosis Date Noted  . 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 Carotid stenosis, bilateral 12/30/2018  . Stroke ([place]) 12/24/2018  . Stroke-like symptoms 12/23/2018  . Generalized weakness 06/09/2018  . Hypothyroidism (acquired) 05/03/2018  . Hypertension 05/03/2018  . Uncontrolled type 2 diabetes mellitus with diabetic nephropathy, with long-term current use of insulin ([place])   . Back pain at L4-L5 level 06/14/2013  . Muscle weakness (generalized) 12/17/2012  . Pain in joint, shoulder region 12/17/2012  . Shoulder fracture 12/14/2012  . Proximal humerus fracture 11/29/2012  . FRACTURE, TOE, RIGHT 09/10/2009   Danielle [name], Danielle [name],Danielle [name]  [phone]  02/28/2019, 4:06 PM  [facility] [address] [place], [place], [address] Phone: [phone]   Fax:  [phone]  Name: Danielle [name] MRN: [id number] Date of Birth: Jul 07, 1931

## 2019-02-28 NOTE — Patient Instructions (Signed)
Weight-bearing for Shoulder Subluxation   1) Weight-bearing lean stretch: From a seated position on your bed or bench, prop yourself up on your affected arm by placing your affected arm about a foot away from your body. Then lean into it.  Hold for 10 seconds.  Complete 2-3 times.   OR   2) Rocking Side-to-Side: place both arms out beside you (one on either side). Then slowly rock from side to side, shifting your weight from one arm to the next. You can place a rolled up towel underneath your hand to increase comfort.   3) Weight-bearing in standing:  Gently lean your body weight into your hand or fist. Keep the elbow as straight as possible. Hold for 10 seconds.  Complete 2-3 times.

## 2019-03-04 ENCOUNTER — Ambulatory Visit (HOSPITAL_COMMUNITY): Payer: Medicare Other | Admitting: Occupational Therapy

## 2019-03-04 ENCOUNTER — Encounter (HOSPITAL_COMMUNITY): Payer: Self-pay | Admitting: Occupational Therapy

## 2019-03-04 ENCOUNTER — Other Ambulatory Visit: Payer: Self-pay

## 2019-03-04 DIAGNOSIS — R29898 Other symptoms and signs involving the musculoskeletal system: Secondary | ICD-10-CM | POA: Diagnosis not present

## 2019-03-04 DIAGNOSIS — R278 Other lack of coordination: Secondary | ICD-10-CM

## 2019-03-04 DIAGNOSIS — M25612 Stiffness of left shoulder, not elsewhere classified: Secondary | ICD-10-CM

## 2019-03-04 NOTE — Therapy (Signed)
[facility], [place], [address] Phone: [phone]   Fax:  [phone]  Occupational Therapy Treatment  Patient Details  Name: Danielle [name] MRN: [id number] Date of Birth: Jan 09, 1932 Referring Provider (OT): Dr. Sinda Du   Encounter Date: 03/04/2019  OT End of Session - 03/04/19 1653    Visit Number  2    Number of Visits  16    Date for OT Re-Evaluation  04/25/19   mini reassess: 03/28/19   Authorization Type  [employer] Medicare Copay $35 No visit limit.    Authorization Time Period  progress note at visit 10    Authorization - Visit Number  2    Authorization - Number of Visits  10    OT Start Time  [id number]    OT Stop Time  1515    OT Time Calculation (min)  43 min    Activity Tolerance  Patient tolerated treatment well    Behavior During Therapy  WFL for tasks assessed/performed       Past Medical History:  Diagnosis Date  . Arthritis    "some joint pain once in awhile" (05/26/2017)  . CVA (cerebral vascular accident) ([place]) 2019  . History of kidney stones   . Hyperlipemia   . Hypertension   . Hypothyroidism (acquired)   . Pneumothorax 02/05/2017   right-after fall  . Rib fractures 02/05/2017   right side  . Stroke ([place])   . Type II diabetes mellitus ([place])     Past Surgical History:  Procedure Laterality Date  . AUGMENTATION MAMMAPLASTY Bilateral   . DILATION AND CURETTAGE OF UTERUS    . FRACTURE SURGERY    . REVERSE SHOULDER ARTHROPLASTY Right 05/26/2017  . REVERSE SHOULDER ARTHROPLASTY Right 05/26/2017   Procedure: REVERSE RIGHT SHOULDER ARTHROPLASTY;  Surgeon: Meredith Pel, MD;  Location: [facility];  Service: Orthopedics;  Laterality: Right;  . TONSILLECTOMY    . TUBAL LIGATION      There were no vitals filed for this visit.  Subjective Assessment - 03/04/19 1432    Subjective   S: I tried to dress myself one time but it didn't work.    Special Tests  FOTO - complete next session.     Currently in Pain?  No/denies         [facility] OT Assessment - 03/04/19 1431      Assessment   Medical Diagnosis  Left arm weakness      Precautions   Precautions  Fall;Other (comment)    Precaution Comments  left hemiparesis    Required Braces or Orthoses  Other Brace/Splint    Other Brace/Splint  night time resting hand splint. thumb brace for extension during the day                OT Treatments/Exercises (OP) - 03/04/19 1453      Bed Mobility   Bed Mobility  Sit to Supine;Supine to Sit    Supine to Sit  Contact Guard/Touching assist    Sit to Supine  Minimal Assistance - Patient > 75%      ADLs   UB Dressing  Hemi-dressing practice: Pt doffed sweater with step-by-step verbal cuing to use RUE to remove LUE first, then used LUE lateral pinch to hold cuff and pulled RUE out of sleeve. To donn, pt requiring verbal cuing to locate armhole, then used RUE to push sleeve onto LUE. OT providing verbal cuing to use RUE to pull sweater onto  left shoulder and up neck before reaching back to grasp on right side. Pt was then able to pull around back and thread RUE through sleeve.     Functional Mobility  Pt using hemi-walker during session, OT cuing for correct technique and to not throw walker so far out in front of her that she's stepping too far to catch up. One LOB at end of session requiring mod assist from OT to correct.       Neurological Re-education Exercises   Shoulder Flexion  PROM;10 reps;AAROM;5 reps    Shoulder ABduction  PROM;10 reps    Shoulder Protraction  PROM;10 reps    Shoulder External Rotation  PROM;10 reps    Shoulder Internal Rotation  PROM;10 reps    Elbow Flexion  AROM;10 reps    Elbow Extension  AROM;10 reps    Finger Extension  composite extension, P/ROM 5X    Weight Bearing Position  Seated;Standing    Seated with weight on hand  Pt positioned with LUE to the side, digits extended and palm flat on mat table. Min assist for maintaining elbow extension,  1' holds, 2 trials.     Standing with weight shifting on and off  Pt standing at mat table, positioned bent over at waist with LUE fully extended. Pegboard positioned to pt's left side, pegs to right side. Pt placed 12 pegs using right hand while shifting weight on and off of LUE.     Development of Reach  Closed chain    Closed Chain Exercises  Pt standing at countertop, working to push cones by sliding LUE on counter. Max difficulty with task requiring mod to max facilitation for shoulder mobility.                OT Short Term Goals - 03/04/19 1657      OT SHORT TERM GOAL #1   Title  Patient and family will be provided and educated on HEP as well as any equipment or adaptive strategies to help aid with increasing the functional use of her LUE during ADL tasks.    Time  4    Period  Weeks    Status  On-going    Target Date  03/28/19      OT SHORT TERM GOAL #2   Title  Patient will increase LUE P/ROM to [facility] in order to increase ability to complete dressing tasks.    Time  4    Period  Weeks    Status  On-going      OT SHORT TERM GOAL #3   Title  Patient will increase her left hand grip strength to 10# and her pinch to 5# in order to use her left hand to stabilize an object such as a cup or plate while eating.    Time  4    Period  Weeks    Status  On-going      OT SHORT TERM GOAL #4   Title  Patient will demonstrate/report the ability to use her LUE as a stabilizer during a basic daily task such as during meals or grooming.    Time  4    Period  Weeks    Status  On-going        OT Long Term Goals - 03/04/19 1657      OT LONG TERM GOAL #1   Title  patient will demonstrate the ability to use her left UE as an active assist during daily tasks.    Time  8    Period  Weeks    Status  On-going      OT LONG TERM GOAL #2   Title  Patient will increase her LUE A/ROM to allow her to complete functional tasks at waist level with less difficulty.    Time  8    Period   Weeks    Status  On-going      OT LONG TERM GOAL #3   Title  Patient will increase her left hand strength to 15# and her pinch strength to 8# in order to hold on to items with greater stability and less dropping.    Time  8    Period  Weeks    Status  On-going      OT LONG TERM GOAL #4   Title  Patient will demonstrate increased coordination by completing the 9 hole peg test with therapist providing set up of pegs.    Time  8    Period  Weeks    Status  On-going      OT LONG TERM GOAL #5   Title  Pt will improve her LUE strength to 3/5 in order to complete low level functional reaching tasks while at home requiring greater independence.    Time  8    Period  Weeks    Status  On-going            Plan - 03/04/19 1653    Clinical Impression Statement  A: Initiated weightbearing and weight shifting to improve tone and allow for increased muscle facilitation today. Attempted quadruped however mat table was too small for pt to safely use so will attempt later when larger table is available. Also working on LUE active muscle facilitation and reviewed hemi-dressing techniques. Encouraged pt to slow down and work to complete tasks at home while incorporating LUE when possible and working on problem-solving how to complete tasks using primarily [place].    Body Structure / Function / Physical Skills  Balance;ADL;UE functional use;Fascial restriction;Flexibility;FMC;Proprioception;ROM;Coordination;GMC;Sensation;Decreased knowledge of use of DME;IADL;Strength;Edema;Tone    Plan  P: NMES for wrist and digit extension. Attempt weight bearing in quadruped using large mat table. work on AA/ROM of LUE in supine and update HEP when pt is completing approrpriately       Patient will benefit from skilled therapeutic intervention in order to improve the following deficits and impairments:   Body Structure / Function / Physical Skills: Balance, ADL, UE functional use, Fascial restriction, Flexibility, FMC,  Proprioception, ROM, Coordination, GMC, Sensation, Decreased knowledge of use of DME, IADL, Strength, Edema, Tone       Visit Diagnosis: Other symptoms and signs involving the musculoskeletal system  Other lack of coordination  Stiffness of left shoulder, not elsewhere classified    Problem List Patient Active Problem List   Diagnosis Date Noted  . 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 Carotid stenosis, bilateral 12/30/2018  . Stroke ([place]) 12/24/2018  . Stroke-like symptoms 12/23/2018  . Generalized weakness 06/09/2018  . Hypothyroidism (acquired) 05/03/2018  . Hypertension 05/03/2018  . Uncontrolled type 2 diabetes mellitus with diabetic nephropathy, with long-term current use of insulin ([place])   . Back pain at L4-L5 level 06/14/2013  . Muscle weakness (generalized) 12/17/2012  . Pain in joint, shoulder region 12/17/2012  . Shoulder fracture 12/14/2012  . Proximal humerus fracture 11/29/2012  . FRACTURE, TOE, RIGHT 09/10/2009   Guadelupe Sabin, OTR/L  [phone] 03/04/2019, 4:58 PM  [facility] [address] [place], [place], [address] Phone: [phone]   Fax:  [phone]  Name: Danielle [name] MRN: [id number] Date of Birth: [birth year]

## 2019-03-09 ENCOUNTER — Other Ambulatory Visit: Payer: Self-pay

## 2019-03-09 ENCOUNTER — Encounter (HOSPITAL_COMMUNITY): Payer: Self-pay | Admitting: Occupational Therapy

## 2019-03-09 ENCOUNTER — Ambulatory Visit (HOSPITAL_COMMUNITY): Payer: Medicare Other | Admitting: Occupational Therapy

## 2019-03-09 DIAGNOSIS — R278 Other lack of coordination: Secondary | ICD-10-CM

## 2019-03-09 DIAGNOSIS — M25612 Stiffness of left shoulder, not elsewhere classified: Secondary | ICD-10-CM

## 2019-03-09 DIAGNOSIS — R29898 Other symptoms and signs involving the musculoskeletal system: Secondary | ICD-10-CM | POA: Diagnosis not present

## 2019-03-09 NOTE — Patient Instructions (Signed)
Complete Exercises 10X, 2X/day  1) WAND FLEXION  Lying on  your back and holding a wand or cane, slowly raise the wand towards overhead. Use your unaffected arm to assist with the movement.       2) Shoulder protraction  Start with elbows bent by your side. Push arms straight up towards the ceiling like you're doing a bench press.     3) Elbow Flexion/Extension  While lying on your back, rest your elbow on a small rolled up towel.  Next, bend at your elbow and then lower back down and repeat.

## 2019-03-09 NOTE — Therapy (Signed)
[place], [place], [address] Phone: [phone]   Fax:  [phone]  Occupational Therapy Treatment  Patient Details  Name: Danielle [name] MRN: [id number] Date of Birth: 1932-01-19 Referring Provider (OT): Dr. Sinda Du   Encounter Date: 03/09/2019  OT End of Session - 03/09/19 1609    Visit Number  3    Number of Visits  16    Date for OT Re-Evaluation  04/25/19   mini reassess: 03/28/19   Authorization Type  [employer] Medicare Copay $35 No visit limit.    Authorization Time Period  progress note at visit 10    Authorization - Visit Number  3    Authorization - Number of Visits  10    OT Start Time  [id number]    OT Stop Time  1600    OT Time Calculation (min)  44 min    Activity Tolerance  Patient tolerated treatment well    Behavior During Therapy  WFL for tasks assessed/performed       Past Medical History:  Diagnosis Date  . Arthritis    "some joint pain once in awhile" (05/26/2017)  . CVA (cerebral vascular accident) ([place]) 2019  . History of kidney stones   . Hyperlipemia   . Hypertension   . Hypothyroidism (acquired)   . Pneumothorax 02/05/2017   right-after fall  . Rib fractures 02/05/2017   right side  . Stroke ([place])   . Type II diabetes mellitus ([place])     Past Surgical History:  Procedure Laterality Date  . AUGMENTATION MAMMAPLASTY Bilateral   . DILATION AND CURETTAGE OF UTERUS    . FRACTURE SURGERY    . REVERSE SHOULDER ARTHROPLASTY Right 05/26/2017  . REVERSE SHOULDER ARTHROPLASTY Right 05/26/2017   Procedure: REVERSE RIGHT SHOULDER ARTHROPLASTY;  Surgeon: Meredith Pel, MD;  Location: [facility];  Service: Orthopedics;  Laterality: Right;  . TONSILLECTOMY    . TUBAL LIGATION      There were no vitals filed for this visit.  Subjective Assessment - 03/09/19 1520    Subjective   S: We worked on my hand this weekend a couple of times.    Currently in Pain?  No/denies          [facility] OT Assessment - 03/09/19 1519      Assessment   Medical Diagnosis  Left arm weakness      Precautions   Precautions  Fall;Other (comment)    Precaution Comments  left hemiparesis    Required Braces or Orthoses  Other Brace/Splint    Other Brace/Splint  night time resting hand splint. thumb brace for extension during the day                OT Treatments/Exercises (OP) - 03/09/19 1520      Neurological Re-education Exercises   Shoulder Flexion  PROM;5 reps;AAROM;10 reps    Shoulder ABduction  PROM;5 reps    Shoulder Protraction  PROM;5 reps;AAROM;10 reps    Shoulder Horizontal ABduction  PROM;5 reps    Shoulder External Rotation  PROM;5 reps    Shoulder Internal Rotation  PROM;5 reps    Elbow Flexion  AROM;10 reps    Elbow Extension  AROM;10 reps    Forearm Supination  AROM;10 reps    Forearm Pronation  AROM;10 reps    Weight Bearing Position  Standing;Seated    Seated with weight on forearm  Pt leaning to side with weight along left forearm. Holding for  1', 2 trials, no tactile assist required to maintain position    Standing with weight shifting on and off  Pt standing at table working to flatten yellow theraputty while weightshifting on and off of LUE. OT providing min to mod assist at elbow for stability.              OT Education - 03/09/19 1608    Education Details  AA/ROM shoulder protraction and flexion, A/ROM elbow flexion/extention. All completed in supine    Person(s) Educated  Patient;Spouse    Methods  Explanation;Handout;Demonstration;Verbal cues    Comprehension  Verbalized understanding;Returned demonstration       OT Short Term Goals - 03/04/19 1657      OT SHORT TERM GOAL #1   Title  Patient and family will be provided and educated on HEP as well as any equipment or adaptive strategies to help aid with increasing the functional use of her LUE during ADL tasks.    Time  4    Period  Weeks    Status  On-going    Target Date   03/28/19      OT SHORT TERM GOAL #2   Title  Patient will increase LUE P/ROM to [facility] in order to increase ability to complete dressing tasks.    Time  4    Period  Weeks    Status  On-going      OT SHORT TERM GOAL #3   Title  Patient will increase her left hand grip strength to 10# and her pinch to 5# in order to use her left hand to stabilize an object such as a cup or plate while eating.    Time  4    Period  Weeks    Status  On-going      OT SHORT TERM GOAL #4   Title  Patient will demonstrate/report the ability to use her LUE as a stabilizer during a basic daily task such as during meals or grooming.    Time  4    Period  Weeks    Status  On-going        OT Long Term Goals - 03/04/19 1657      OT LONG TERM GOAL #1   Title  patient will demonstrate the ability to use her left UE as an active assist during daily tasks.    Time  8    Period  Weeks    Status  On-going      OT LONG TERM GOAL #2   Title  Patient will increase her LUE A/ROM to allow her to complete functional tasks at waist level with less difficulty.    Time  8    Period  Weeks    Status  On-going      OT LONG TERM GOAL #3   Title  Patient will increase her left hand strength to 15# and her pinch strength to 8# in order to hold on to items with greater stability and less dropping.    Time  8    Period  Weeks    Status  On-going      OT LONG TERM GOAL #4   Title  Patient will demonstrate increased coordination by completing the 9 hole peg test with therapist providing set up of pegs.    Time  8    Period  Weeks    Status  On-going      OT LONG TERM GOAL #5   Title  Pt will  improve her LUE strength to 3/5 in order to complete low level functional reaching tasks while at home requiring greater independence.    Time  8    Period  Weeks    Status  On-going            Plan - 03/09/19 1609    Clinical Impression Statement  A: Continued with weightbearing and weight shifting this session. Pt  completing weight shifting while standing at mat table working to flatten yellow theraputty. Max assist required for initial digit extension. After activity pt noted to have broken blood vessel with swelling on dorsal hand, beginning to resolve by end of session. Pt with improvement in muscle activation during AA/ROM today, no tactile assist required from OT. Husband reports pt is resistant to HEP completion, educated pt on need for completing HEP daily to improve functioning. pt verbalized understanding and states she will do HEP 2x/day.    Body Structure / Function / Physical Skills  Balance;ADL;UE functional use;Fascial restriction;Flexibility;FMC;Proprioception;ROM;Coordination;GMC;Sensation;Decreased knowledge of use of DME;IADL;Strength;Edema;Tone    Plan  P: Follow up on HEP completion, use NMES for wrist and digit extension. Attempt weightbearing in quadruped using large mat table.       Patient will benefit from skilled therapeutic intervention in order to improve the following deficits and impairments:   Body Structure / Function / Physical Skills: Balance, ADL, UE functional use, Fascial restriction, Flexibility, FMC, Proprioception, ROM, Coordination, GMC, Sensation, Decreased knowledge of use of DME, IADL, Strength, Edema, Tone       Visit Diagnosis: Other symptoms and signs involving the musculoskeletal system  Other lack of coordination  Stiffness of left shoulder, not elsewhere classified    Problem List Patient Active Problem List   Diagnosis Date Noted  . 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 Carotid stenosis, bilateral 12/30/2018  . Stroke ([place]) 12/24/2018  . Stroke-like symptoms 12/23/2018  . Generalized weakness 06/09/2018  . Hypothyroidism (acquired) 05/03/2018  . Hypertension 05/03/2018  . Uncontrolled type 2 diabetes mellitus with diabetic nephropathy, with long-term current use of insulin ([place])   . Back pain at L4-L5 level 06/14/2013  .  Muscle weakness (generalized) 12/17/2012  . Pain in joint, shoulder region 12/17/2012  . Shoulder fracture 12/14/2012  . Proximal humerus fracture 11/29/2012  . FRACTURE, TOE, RIGHT 09/10/2009   Guadelupe Sabin, OTR/L  [phone] 03/09/2019, 4:13 PM  [facility] [address] [place], [place], [address] Phone: [phone]   Fax:  [phone]  Name: Danielle [name] MRN: [id number] Date of Birth: 05/19/1932

## 2019-03-11 ENCOUNTER — Encounter (HOSPITAL_COMMUNITY): Payer: Self-pay | Admitting: Occupational Therapy

## 2019-03-11 ENCOUNTER — Ambulatory Visit (HOSPITAL_COMMUNITY): Payer: Medicare Other | Admitting: Occupational Therapy

## 2019-03-11 ENCOUNTER — Other Ambulatory Visit: Payer: Self-pay

## 2019-03-11 DIAGNOSIS — R29898 Other symptoms and signs involving the musculoskeletal system: Secondary | ICD-10-CM | POA: Diagnosis not present

## 2019-03-11 DIAGNOSIS — R278 Other lack of coordination: Secondary | ICD-10-CM

## 2019-03-11 NOTE — Therapy (Signed)
[place], [place], [address] Phone: [phone]   Fax:  [phone]  Occupational Therapy Treatment  Patient Details  Name: Danielle [name] MRN: [id number] Date of Birth: Sep 29, 1931 Referring Provider (OT): Dr. Sinda Du   Encounter Date: 03/11/2019  OT End of Session - 03/11/19 1506    Visit Number  4    Number of Visits  16    Date for OT Re-Evaluation  04/25/19   mini reassess: 03/28/19   Authorization Type  [employer] Medicare Copay $35 No visit limit.    Authorization Time Period  progress note at visit 10    Authorization - Visit Number  4    Authorization - Number of Visits  10    OT Start Time  [id number]    OT Stop Time  1511    OT Time Calculation (min)  38 min    Activity Tolerance  Patient tolerated treatment well    Behavior During Therapy  WFL for tasks assessed/performed       Past Medical History:  Diagnosis Date  . Arthritis    "some joint pain once in awhile" (05/26/2017)  . CVA (cerebral vascular accident) ([place]) 2019  . History of kidney stones   . Hyperlipemia   . Hypertension   . Hypothyroidism (acquired)   . Pneumothorax 02/05/2017   right-after fall  . Rib fractures 02/05/2017   right side  . Stroke ([place])   . Type II diabetes mellitus ([place])     Past Surgical History:  Procedure Laterality Date  . AUGMENTATION MAMMAPLASTY Bilateral   . DILATION AND CURETTAGE OF UTERUS    . FRACTURE SURGERY    . REVERSE SHOULDER ARTHROPLASTY Right 05/26/2017  . REVERSE SHOULDER ARTHROPLASTY Right 05/26/2017   Procedure: REVERSE RIGHT SHOULDER ARTHROPLASTY;  Surgeon: Meredith Pel, MD;  Location: [facility];  Service: Orthopedics;  Laterality: Right;  . TONSILLECTOMY    . TUBAL LIGATION      There were no vitals filed for this visit.  Subjective Assessment - 03/11/19 1432    Subjective   S: I worked on my hand and did the bar exercise.    Currently in Pain?  No/denies         [facility] OT  Assessment - 03/11/19 1432      Assessment   Medical Diagnosis  Left arm weakness      Precautions   Precautions  Fall;Other (comment)    Precaution Comments  left hemiparesis    Required Braces or Orthoses  Other Brace/Splint    Other Brace/Splint  night time resting hand splint. thumb brace for extension during the day                OT Treatments/Exercises (OP) - 03/11/19 1453      Bed Mobility   Bed Mobility  Sit to Supine;Supine to Sit;Rolling Right    Rolling Right  Minimal Assistance - Patient > 75%    Supine to Sit  Minimal Assistance - Patient > 75%    Sit to Supine  Minimal Assistance - Patient > 75%      ADLs   ADL Comments  Working on hand washing at beginning of session incorporating left hand into task. Verbal cuing to square up to sink and stand steadily prior to reaching for soap. Min assist to bring left arm forward and hold over sink.       Neurological Re-education Exercises   Shoulder Flexion  PROM;5  reps;AAROM;10 reps    Shoulder Protraction  PROM;5 reps;AAROM;10 reps    Shoulder Horizontal ABduction  PROM;5 reps    Shoulder External Rotation  PROM;5 reps    Shoulder Internal Rotation  PROM;5 reps    Weight Bearing Position  Quadraped    Other Weight-Bearing Exercises 1  attempted quadruped, pt unable to push up on RUE      Modalities   Modalities  Electrical Stimulation      Electrical Stimulation   Electrical Stimulation Location  left forearm flexors and extensors    Electrical Stimulation Action  russian    Electrical Stimulation Parameters  42 mA CC    Electrical Stimulation Goals  Neuromuscular facilitation               OT Short Term Goals - 03/04/19 1657      OT SHORT TERM GOAL #1   Title  Patient and family will be provided and educated on HEP as well as any equipment or adaptive strategies to help aid with increasing the functional use of her LUE during ADL tasks.    Time  4    Period  Weeks    Status  On-going     Target Date  03/28/19      OT SHORT TERM GOAL #2   Title  Patient will increase LUE P/ROM to [facility] in order to increase ability to complete dressing tasks.    Time  4    Period  Weeks    Status  On-going      OT SHORT TERM GOAL #3   Title  Patient will increase her left hand grip strength to 10# and her pinch to 5# in order to use her left hand to stabilize an object such as a cup or plate while eating.    Time  4    Period  Weeks    Status  On-going      OT SHORT TERM GOAL #4   Title  Patient will demonstrate/report the ability to use her LUE as a stabilizer during a basic daily task such as during meals or grooming.    Time  4    Period  Weeks    Status  On-going        OT Long Term Goals - 03/04/19 1657      OT LONG TERM GOAL #1   Title  patient will demonstrate the ability to use her left UE as an active assist during daily tasks.    Time  8    Period  Weeks    Status  On-going      OT LONG TERM GOAL #2   Title  Patient will increase her LUE A/ROM to allow her to complete functional tasks at waist level with less difficulty.    Time  8    Period  Weeks    Status  On-going      OT LONG TERM GOAL #3   Title  Patient will increase her left hand strength to 15# and her pinch strength to 8# in order to hold on to items with greater stability and less dropping.    Time  8    Period  Weeks    Status  On-going      OT LONG TERM GOAL #4   Title  Patient will demonstrate increased coordination by completing the 9 hole peg test with therapist providing set up of pegs.    Time  8    Period  Weeks    Status  On-going      OT LONG TERM GOAL #5   Title  Pt will improve her LUE strength to 3/5 in order to complete low level functional reaching tasks while at home requiring greater independence.    Time  8    Period  Weeks    Status  On-going            Plan - 03/11/19 1504    Clinical Impression Statement  A: Attempted weightbearing in quadruped today, pt unable to  attain quadruped position due to RUE weakness. Continued with AA/ROM in supine, pt requiring cuing for correct form and completion. Initiated NMES this date for wrist extensors. Discussed PT referral for gait/ambulation and balance, pt and husband agreeable.    Body Structure / Function / Physical Skills  Balance;ADL;UE functional use;Fascial restriction;Flexibility;FMC;Proprioception;ROM;Coordination;GMC;Sensation;Decreased knowledge of use of DME;IADL;Strength;Edema;Tone    Plan  P: Continue with weightbearing/weightshifting on tabletop or seated. Continue with NMES for neuromuscular facilitation       Patient will benefit from skilled therapeutic intervention in order to improve the following deficits and impairments:   Body Structure / Function / Physical Skills: Balance, ADL, UE functional use, Fascial restriction, Flexibility, FMC, Proprioception, ROM, Coordination, GMC, Sensation, Decreased knowledge of use of DME, IADL, Strength, Edema, Tone       Visit Diagnosis: Other symptoms and signs involving the musculoskeletal system  Other lack of coordination    Problem List Patient Active Problem List   Diagnosis Date Noted  . 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 Carotid stenosis, bilateral 12/30/2018  . Stroke ([place]) 12/24/2018  . Stroke-like symptoms 12/23/2018  . Generalized weakness 06/09/2018  . Hypothyroidism (acquired) 05/03/2018  . Hypertension 05/03/2018  . Uncontrolled type 2 diabetes mellitus with diabetic nephropathy, with long-term current use of insulin ([place])   . Back pain at L4-L5 level 06/14/2013  . Muscle weakness (generalized) 12/17/2012  . Pain in joint, shoulder region 12/17/2012  . Shoulder fracture 12/14/2012  . Proximal humerus fracture 11/29/2012  . FRACTURE, TOE, RIGHT 09/10/2009   Guadelupe Sabin, OTR/L  [phone] 03/11/2019, 4:05 PM  [place], [place],  [address] Phone: [phone]   Fax:  [phone]  Name: DALECIA SALZWEDEL MRN: [id number] Date of Birth: 1931-11-04

## 2019-03-14 ENCOUNTER — Ambulatory Visit (HOSPITAL_COMMUNITY): Payer: Medicare Other

## 2019-03-14 ENCOUNTER — Encounter (HOSPITAL_COMMUNITY): Payer: Self-pay

## 2019-03-14 ENCOUNTER — Other Ambulatory Visit: Payer: Self-pay

## 2019-03-14 DIAGNOSIS — M25612 Stiffness of left shoulder, not elsewhere classified: Secondary | ICD-10-CM

## 2019-03-14 DIAGNOSIS — R278 Other lack of coordination: Secondary | ICD-10-CM

## 2019-03-14 DIAGNOSIS — R29898 Other symptoms and signs involving the musculoskeletal system: Secondary | ICD-10-CM | POA: Diagnosis not present

## 2019-03-14 NOTE — Therapy (Signed)
[place], [place], [address] Phone: [phone]   Fax:  [phone]  Patient Details  Name: Danielle [name] MRN: [id number] Date of Birth: 1931-12-20 Referring Provider:  No ref. provider found  Encounter Date: 03/14/2019  Faxed a PT referral to Dr. Luan Pulling today for gait and balance issues due to CVA.   Ailene Ravel, OTR/L,CBIS  [phone]  03/14/2019, 4:47 PM  [facility] [address] [place], [place], [address] Phone: [phone]   Fax:  [phone]

## 2019-03-14 NOTE — Therapy (Signed)
[place], [place], [address] Phone: [phone]   Fax:  [phone]  Occupational Therapy Treatment  Patient Details  Name: Danielle [name] MRN: [id number] Date of Birth: July 13, 1931 Referring Provider (OT): Dr. Sinda Du   Encounter Date: 03/14/2019  OT End of Session - 03/14/19 1621    Visit Number  5    Number of Visits  16    Date for OT Re-Evaluation  04/25/19   mini reassess: 03/28/19   Authorization Type  [employer] Medicare Copay $35 No visit limit.    Authorization Time Period  progress note at visit 10    Authorization - Visit Number  5    Authorization - Number of Visits  10    OT Start Time  1430    OT Stop Time  1510    OT Time Calculation (min)  40 min    Activity Tolerance  Patient tolerated treatment well    Behavior During Therapy  WFL for tasks assessed/performed       Past Medical History:  Diagnosis Date  . Arthritis    "some joint pain once in awhile" (05/26/2017)  . CVA (cerebral vascular accident) ([place]) 2019  . History of kidney stones   . Hyperlipemia   . Hypertension   . Hypothyroidism (acquired)   . Pneumothorax 02/05/2017   right-after fall  . Rib fractures 02/05/2017   right side  . Stroke ([place])   . Type II diabetes mellitus ([place])     Past Surgical History:  Procedure Laterality Date  . AUGMENTATION MAMMAPLASTY Bilateral   . DILATION AND CURETTAGE OF UTERUS    . FRACTURE SURGERY    . REVERSE SHOULDER ARTHROPLASTY Right 05/26/2017  . REVERSE SHOULDER ARTHROPLASTY Right 05/26/2017   Procedure: REVERSE RIGHT SHOULDER ARTHROPLASTY;  Surgeon: Meredith Pel, MD;  Location: [facility];  Service: Orthopedics;  Laterality: Right;  . TONSILLECTOMY    . TUBAL LIGATION      There were no vitals filed for this visit.  Subjective Assessment - 03/14/19 1618    Subjective   S: I'm so afraid to walk by myself because I feel like I'll fall.    Currently in Pain?  No/denies          [facility] OT Assessment - 03/14/19 1619      Assessment   Medical Diagnosis  Left arm weakness      Precautions   Precautions  Fall;Other (comment)    Precaution Comments  left hemiparesis               OT Treatments/Exercises (OP) - 03/14/19 1619      Exercises   Exercises  Shoulder      Neurological Re-education Exercises   Weight Bearing Position  Seated    Seated with weight on forearm  Weightbearing completed. Pt bore weight onto left forearm while reaching to the left as well as forward while placing resistive clothespins onto vertical pole. Support provided to LUE for proper positioning. VC for technique.       Modalities   Modalities  Teacher, English as a foreign language Location  left upper trapezius region    Clinical biochemist Parameters  26 mA CC 8'    Printmaker Goals  Neuromuscular facilitation               OT Short Term Goals - 03/04/19  [place] #1   Title  Patient and family will be provided and educated on HEP as well as any equipment or adaptive strategies to help aid with increasing the functional use of her LUE during ADL tasks.    Time  4    Period  Weeks    Status  On-going    Target Date  03/28/19      OT SHORT TERM GOAL #2   Title  Patient will increase LUE P/ROM to [facility] in order to increase ability to complete dressing tasks.    Time  4    Period  Weeks    Status  On-going      OT SHORT TERM GOAL #3   Title  Patient will increase her left hand grip strength to 10# and her pinch to 5# in order to use her left hand to stabilize an object such as a cup or plate while eating.    Time  4    Period  Weeks    Status  On-going      OT SHORT TERM GOAL #4   Title  Patient will demonstrate/report the ability to use her LUE as a stabilizer during a basic daily task such as during meals or grooming.    Time  4     Period  Weeks    Status  On-going        OT Long Term Goals - 03/04/19 1657      OT LONG TERM GOAL #1   Title  patient will demonstrate the ability to use her left UE as an active assist during daily tasks.    Time  8    Period  Weeks    Status  On-going      OT LONG TERM GOAL #2   Title  Patient will increase her LUE A/ROM to allow her to complete functional tasks at waist level with less difficulty.    Time  8    Period  Weeks    Status  On-going      OT LONG TERM GOAL #3   Title  Patient will increase her left hand strength to 15# and her pinch strength to 8# in order to hold on to items with greater stability and less dropping.    Time  8    Period  Weeks    Status  On-going      OT LONG TERM GOAL #4   Title  Patient will demonstrate increased coordination by completing the 9 hole peg test with therapist providing set up of pegs.    Time  8    Period  Weeks    Status  On-going      OT LONG TERM GOAL #5   Title  Pt will improve her LUE strength to 3/5 in order to complete low level functional reaching tasks while at home requiring greater independence.    Time  8    Period  Weeks    Status  On-going            Plan - 03/14/19 1622    Clinical Impression Statement  A: Utilized NMES to upper trapezius to faciliate scapular movement. Pt completed and held shoulder shrug at each on ramp for 5 seconds while requiring a verbal cue from therapist. Completed weightbearing to forearm during lateral and forward reaching. pt had difficulty with placing all weight onto forearm and used her core to assist with sitting  back upright or when transitioning to standing.    Body Structure / Function / Physical Skills  Balance;ADL;UE functional use;Fascial restriction;Flexibility;FMC;Proprioception;ROM;Coordination;GMC;Sensation;Decreased knowledge of use of DME;IADL;Strength;Edema;Tone    Plan  P: Continue with weightbearing/weightshifting seated. Continue with NMES for nueromuscular  facilitation.    Consulted and Agree with Plan of Care  Patient       Patient will benefit from skilled therapeutic intervention in order to improve the following deficits and impairments:   Body Structure / Function / Physical Skills: Balance, ADL, UE functional use, Fascial restriction, Flexibility, FMC, Proprioception, ROM, Coordination, GMC, Sensation, Decreased knowledge of use of DME, IADL, Strength, Edema, Tone       Visit Diagnosis: Other symptoms and signs involving the musculoskeletal system  Other lack of coordination  Stiffness of left shoulder, not elsewhere classified    Problem List Patient Active Problem List   Diagnosis Date Noted  . 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 Carotid stenosis, bilateral 12/30/2018  . Stroke ([place]) 12/24/2018  . Stroke-like symptoms 12/23/2018  . Generalized weakness 06/09/2018  . Hypothyroidism (acquired) 05/03/2018  . Hypertension 05/03/2018  . Uncontrolled type 2 diabetes mellitus with diabetic nephropathy, with long-term current use of insulin ([place])   . Back pain at L4-L5 level 06/14/2013  . Muscle weakness (generalized) 12/17/2012  . Pain in joint, shoulder region 12/17/2012  . Shoulder fracture 12/14/2012  . Proximal humerus fracture 11/29/2012  . FRACTURE, TOE, RIGHT 09/10/2009   Ailene Ravel, OTR/L,CBIS  [phone]  03/14/2019, 4:26 PM  [place] [address] [place], [place], [address] Phone: [phone]   Fax:  [phone]  Name: Danielle [name] MRN: [id number] Date of Birth: 1932/02/25

## 2019-03-17 ENCOUNTER — Encounter (HOSPITAL_COMMUNITY): Payer: Self-pay

## 2019-03-17 ENCOUNTER — Other Ambulatory Visit: Payer: Self-pay

## 2019-03-17 ENCOUNTER — Ambulatory Visit (HOSPITAL_COMMUNITY): Payer: Medicare Other

## 2019-03-17 DIAGNOSIS — R29898 Other symptoms and signs involving the musculoskeletal system: Secondary | ICD-10-CM | POA: Diagnosis not present

## 2019-03-17 DIAGNOSIS — M25612 Stiffness of left shoulder, not elsewhere classified: Secondary | ICD-10-CM

## 2019-03-17 DIAGNOSIS — R278 Other lack of coordination: Secondary | ICD-10-CM

## 2019-03-17 NOTE — Therapy (Signed)
[place], [place], [address] Phone: [phone]   Fax:  [phone]  Occupational Therapy Treatment  Patient Details  Name: Danielle [name] MRN: [id number] Date of Birth: 1932/05/09 Referring Provider (OT): Dr. Sinda Du   Encounter Date: 03/17/2019  OT End of Session - 03/17/19 1617    Visit Number  6    Number of Visits  16    Date for OT Re-Evaluation  04/25/19   mini reassess: 03/28/19   Authorization Type  [employer] Medicare Copay $35 No visit limit.    Authorization Time Period  progress note at visit 10    Authorization - Visit Number  6    Authorization - Number of Visits  10    OT Start Time  1430    OT Stop Time  1509    OT Time Calculation (min)  39 min    Activity Tolerance  Patient tolerated treatment well    Behavior During Therapy  WFL for tasks assessed/performed       Past Medical History:  Diagnosis Date  . Arthritis    "some joint pain once in awhile" (05/26/2017)  . CVA (cerebral vascular accident) ([place]) 2019  . History of kidney stones   . Hyperlipemia   . Hypertension   . Hypothyroidism (acquired)   . Pneumothorax 02/05/2017   right-after fall  . Rib fractures 02/05/2017   right side  . Stroke ([place])   . Type II diabetes mellitus ([place])     Past Surgical History:  Procedure Laterality Date  . AUGMENTATION MAMMAPLASTY Bilateral   . DILATION AND CURETTAGE OF UTERUS    . FRACTURE SURGERY    . REVERSE SHOULDER ARTHROPLASTY Right 05/26/2017  . REVERSE SHOULDER ARTHROPLASTY Right 05/26/2017   Procedure: REVERSE RIGHT SHOULDER ARTHROPLASTY;  Surgeon: Meredith Pel, MD;  Location: [facility];  Service: Orthopedics;  Laterality: Right;  . TONSILLECTOMY    . TUBAL LIGATION      There were no vitals filed for this visit.  Subjective Assessment - 03/17/19 1553    Subjective   S: I'm starting PT coming up now.    Currently in Pain?  No/denies         [facility] OT Assessment -  03/17/19 1554      Assessment   Medical Diagnosis  Left arm weakness      Precautions   Precautions  Fall;Other (comment)    Precaution Comments  left hemiparesis               OT Treatments/Exercises (OP) - 03/17/19 1554      Exercises   Exercises  Shoulder      Neurological Re-education Exercises   Other Exercises 1  With Left hand placed on pink ball, patient completed AA/ROM (shoulder flexion/extension) while therapist provided support of LUE to remain on ball. Muscle tapping completed to faciliate activation.     Other Exercises 2  Completed A/ROM shoulder elevation (shoulder shrug holds) during on ramp of NMES with therapist providing verbal cuing to shrug and hold, followed by relax.    Seated with weight on forearm  seated weightbearing completed with left UE placed to the side out of flexor synergy. Forearm placed on medium size ball to provide visual feedback on amount of weightbearing while pulling Squigz off floor length mirror. Completed while reaching towards the left as well as right.     Seated with weight on hand  With left hand placed on  medium size pink ball, patient completed isometric weightbearing. Shoulder flexion and adduction completed. 5x5"      Modalities   Modalities  Teacher, English as a foreign language Location  left upper trapezius region    Clinical biochemist Parameters  23 mA CC 8'    Printmaker Goals  Neuromuscular facilitation               OT Short Term Goals - 03/04/19 1657      OT SHORT TERM GOAL #1   Title  Patient and family will be provided and educated on HEP as well as any equipment or adaptive strategies to help aid with increasing the functional use of her LUE during ADL tasks.    Time  4    Period  Weeks    Status  On-going    Target Date  03/28/19      OT SHORT TERM GOAL #2   Title  Patient will increase LUE P/ROM to  [facility] in order to increase ability to complete dressing tasks.    Time  4    Period  Weeks    Status  On-going      OT SHORT TERM GOAL #3   Title  Patient will increase her left hand grip strength to 10# and her pinch to 5# in order to use her left hand to stabilize an object such as a cup or plate while eating.    Time  4    Period  Weeks    Status  On-going      OT SHORT TERM GOAL #4   Title  Patient will demonstrate/report the ability to use her LUE as a stabilizer during a basic daily task such as during meals or grooming.    Time  4    Period  Weeks    Status  On-going        OT Long Term Goals - 03/04/19 1657      OT LONG TERM GOAL #1   Title  patient will demonstrate the ability to use her left UE as an active assist during daily tasks.    Time  8    Period  Weeks    Status  On-going      OT LONG TERM GOAL #2   Title  Patient will increase her LUE A/ROM to allow her to complete functional tasks at waist level with less difficulty.    Time  8    Period  Weeks    Status  On-going      OT LONG TERM GOAL #3   Title  Patient will increase her left hand strength to 15# and her pinch strength to 8# in order to hold on to items with greater stability and less dropping.    Time  8    Period  Weeks    Status  On-going      OT LONG TERM GOAL #4   Title  Patient will demonstrate increased coordination by completing the 9 hole peg test with therapist providing set up of pegs.    Time  8    Period  Weeks    Status  On-going      OT LONG TERM GOAL #5   Title  Pt will improve her LUE strength to 3/5 in order to complete low level functional reaching tasks while at home requiring greater independence.    Time  8    Period  Weeks    Status  On-going            Plan - 03/17/19 1618    Clinical Impression Statement  A: Able to demonstrate some A/AROM of her shoulder this session with use of NMES, muscle tapping and floor length mirror to provide visual feedback.    Body  Structure / Function / Physical Skills  Balance;ADL;UE functional use;Fascial restriction;Flexibility;FMC;Proprioception;ROM;Coordination;GMC;Sensation;Decreased knowledge of use of DME;IADL;Strength;Edema;Tone    Plan  P: Continue with weightbearing/weightshifting seated. Continue with NMES for nueromuscular facilitation.    Consulted and Agree with Plan of Care  Patient       Patient will benefit from skilled therapeutic intervention in order to improve the following deficits and impairments:   Body Structure / Function / Physical Skills: Balance, ADL, UE functional use, Fascial restriction, Flexibility, FMC, Proprioception, ROM, Coordination, GMC, Sensation, Decreased knowledge of use of DME, IADL, Strength, Edema, Tone       Visit Diagnosis: Other lack of coordination  Other symptoms and signs involving the musculoskeletal system  Stiffness of left shoulder, not elsewhere classified    Problem List Patient Active Problem List   Diagnosis Date Noted  . 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 Carotid stenosis, bilateral 12/30/2018  . Stroke ([place]) 12/24/2018  . Stroke-like symptoms 12/23/2018  . Generalized weakness 06/09/2018  . Hypothyroidism (acquired) 05/03/2018  . Hypertension 05/03/2018  . Uncontrolled type 2 diabetes mellitus with diabetic nephropathy, with long-term current use of insulin ([place])   . Back pain at L4-L5 level 06/14/2013  . Muscle weakness (generalized) 12/17/2012  . Pain in joint, shoulder region 12/17/2012  . Shoulder fracture 12/14/2012  . Proximal humerus fracture 11/29/2012  . FRACTURE, TOE, RIGHT 09/10/2009   Ailene Ravel, OTR/L,CBIS  [phone]  03/17/2019, 4:42 PM  [facility] [address] [place], [place], [address] Phone: [phone]   Fax:  [phone]  Name: Danielle [name] MRN: [id number] Date of Birth: 1931/11/23

## 2019-03-22 ENCOUNTER — Other Ambulatory Visit: Payer: Self-pay

## 2019-03-22 ENCOUNTER — Encounter: Payer: Medicare Other | Attending: Registered Nurse | Admitting: Physical Medicine & Rehabilitation

## 2019-03-22 ENCOUNTER — Encounter: Payer: Self-pay | Admitting: Physical Medicine & Rehabilitation

## 2019-03-22 VITALS — BP 129/81 | HR 74 | Temp 97.5°F | Ht 65.0 in | Wt 132.0 lb

## 2019-03-22 DIAGNOSIS — E7849 Other hyperlipidemia: Secondary | ICD-10-CM | POA: Insufficient documentation

## 2019-03-22 DIAGNOSIS — Z794 Long term (current) use of insulin: Secondary | ICD-10-CM | POA: Insufficient documentation

## 2019-03-22 DIAGNOSIS — G8114 Spastic hemiplegia affecting left nondominant side: Secondary | ICD-10-CM | POA: Diagnosis not present

## 2019-03-22 DIAGNOSIS — E039 Hypothyroidism, unspecified: Secondary | ICD-10-CM | POA: Diagnosis present

## 2019-03-22 DIAGNOSIS — E1165 Type 2 diabetes mellitus with hyperglycemia: Secondary | ICD-10-CM | POA: Diagnosis present

## 2019-03-22 DIAGNOSIS — I639 Cerebral infarction, unspecified: Secondary | ICD-10-CM | POA: Diagnosis not present

## 2019-03-22 DIAGNOSIS — I63511 Cerebral infarction due to unspecified occlusion or stenosis of right middle cerebral artery: Secondary | ICD-10-CM | POA: Diagnosis present

## 2019-03-22 DIAGNOSIS — E1121 Type 2 diabetes mellitus with diabetic nephropathy: Secondary | ICD-10-CM | POA: Insufficient documentation

## 2019-03-22 DIAGNOSIS — I6523 Occlusion and stenosis of bilateral carotid arteries: Secondary | ICD-10-CM | POA: Insufficient documentation

## 2019-03-22 DIAGNOSIS — I10 Essential (primary) hypertension: Secondary | ICD-10-CM | POA: Diagnosis present

## 2019-03-22 NOTE — Progress Notes (Signed)
Subjective:    Patient ID: Danielle [name], female    DOB: 03/22/1932, 85 y.o.   MRN: [id number] 85 y.o. right-handed female with history of hypertension, hyperlipidemia, diabetes mellitus, CVA 05/03/2018 maintained on aspirin as well as left anterior parafalcine meningioma.  She quit smoking 29 years ago she is also had a right shoulder arthroplasty.  Per chart review lives with spouse independent with assistive device prior to admission.  Presented 12/23/2018 to [facility] with left upper extremity weakness.  She denied any chest pain or shortness of breath.  Cranial CT scan negative for acute changes.  Chronic microvascular ischemic change as well as prior right MCA distribution infarct and prior right basal ganglia lacunar infarct.  Patient did not receive TPA.  Carotid Doppler showed chronically occluded right ICA as well as moderate left ICA atherosclerosis.  Left ICA stenosis estimated at 50 to 69%.  MRI MRA showed multiple small acute right MCA infarcts as well as stable 3.7 cm left anterior parafalcine meningioma unchanged since 2019.  Echocardiogram with ejection fraction of 123456 normal systolic function.  EEG negative for seizure.  COVID negative, hemoglobin 12.1, hemoglobin A1c 10.3.  Patient was discharged home 12/27/2018 maintained on full-strength aspirin ambulating minimal assistance.  She developed increased left-sided weakness admitted to the office of Dr. Sinda Du 12/29/2018.  Follow-up MRI showed multiple new acute right MCA infarcts involving the frontal and parietal lobe.  Subacute and chronic infarcts also noted.  She was transferred to [facility] for further evaluation.  CTA of head and neck showed bilateral string sign stenosis of both ICA origins likely due to bulky calcification.  Vascular surgery consulted for carotid stenosis Dr. Carlis Abbott who recommend no surgery at this time follow-up outpatient.  Plavix was added to regimen and continued aspirin.  Admit date:  01/03/2019 Discharge date: 01/21/2019  HPI 85 year old female with right MCA distribution infarct 05/03/2018.  She required quite well however suffered additional right MCA distribution infarcts on 12/29/2018. Left upper ext no longer able to use since most recent stroke Receiving OP PT and OT Walking with quad cane and gait belt Poor level of confidence  Prior to 12/23/2018 used quad cane but was Mod I Now require MinA   Pain Inventory Average Pain 0 Pain Right Now 0 My pain is no pain  In the last 24 hours, has pain interfered with the following? General activity 0 Relation with others 0 Enjoyment of life 0 What TIME of day is your pain at its worst? no pain Sleep (in general) Fair  Pain is worse with: no pain Pain improves with: no pain Relief from Meds: no pain  Mobility walk with assistance use a cane ability to climb steps?  no do you drive?  no use a wheelchair needs help with transfers  Function retired I need assistance with the following:  dressing, bathing, meal prep, household duties and shopping  Neuro/Psych bladder control problems weakness numbness tremor trouble walking confusion  Prior Studies Any changes since last visit?  no  Physicians involved in your care Any changes since last visit?  no   History reviewed. No pertinent family history. Social History   Socioeconomic History  . Marital status: Married    Spouse name: Not on file  . Number of children: Not on file  . Years of education: Not on file  . Highest education level: Not on file  Occupational History  . Not on file  Social Needs  . Financial resource strain:  Patient refused  . Food insecurity    Worry: Patient refused    Inability: Patient refused  . Transportation needs    Medical: Patient refused    Non-medical: Patient refused  Tobacco Use  . Smoking status: Former Smoker    Packs/day: 1.00    Years: 40.00    Pack years: 40.00    Types: Cigarettes    Quit  date: 1991    Years since quitting: 29.8  . Smokeless tobacco: Never Used  Substance and Sexual Activity  . Alcohol use: No  . Drug use: No  . Sexual activity: Not on file  Lifestyle  . Physical activity    Days per week: Patient refused    Minutes per session: Patient refused  . Stress: Patient refused  Relationships  . Social Herbalist on phone: Patient refused    Gets together: Patient refused    Attends religious service: Patient refused    Active member of club or organization: Patient refused    Attends meetings of clubs or organizations: Patient refused    Relationship status: Patient refused  Other Topics Concern  . Not on file  Social History Narrative  . Not on file   Past Surgical History:  Procedure Laterality Date  . AUGMENTATION MAMMAPLASTY Bilateral   . DILATION AND CURETTAGE OF UTERUS    . FRACTURE SURGERY    . REVERSE SHOULDER ARTHROPLASTY Right 05/26/2017  . REVERSE SHOULDER ARTHROPLASTY Right 05/26/2017   Procedure: REVERSE RIGHT SHOULDER ARTHROPLASTY;  Surgeon: Meredith Pel, MD;  Location: [facility];  Service: Orthopedics;  Laterality: Right;  . TONSILLECTOMY    . TUBAL LIGATION     Past Medical History:  Diagnosis Date  . Arthritis    "some joint pain once in awhile" (05/26/2017)  . CVA (cerebral vascular accident) ([place]) 2019  . History of kidney stones   . Hyperlipemia   . Hypertension   . Hypothyroidism (acquired)   . Pneumothorax 02/05/2017   right-after fall  . Rib fractures 02/05/2017   right side  . Stroke ([place])   . Type II diabetes mellitus ([facility])    BP 129/81   Pulse 74   Temp (!) 97.5 F (36.4 C)   Ht 5\' 5"  (1.651 m)   Wt 132 lb (59.9 kg)   SpO2 96%   BMI 21.97 kg/m   Opioid Risk Score:   Fall Risk Score:  `1  Depression screen PHQ 2/9  Depression screen PHQ 2/9 02/02/2019  Decreased Interest 1  Down, Depressed, Hopeless 0  PHQ - 2 Score 1  Altered sleeping 2  Tired, decreased energy 2  Change in appetite 0   Feeling bad or failure about yourself  0  Trouble concentrating 1  Moving slowly or fidgety/restless 0  Suicidal thoughts 0  PHQ-9 Score 6  Difficult doing work/chores Very difficult    Review of Systems  Constitutional: Positive for unexpected weight change.  HENT: Negative.   Eyes: Negative.   Cardiovascular: Negative.   Gastrointestinal: Negative.   Endocrine:       High blood sugar Low blood sugar  Genitourinary: Positive for difficulty urinating.       Incontinence  Musculoskeletal: Positive for gait problem.  Neurological: Positive for tremors, weakness and numbness.  Psychiatric/Behavioral: Positive for confusion.       Objective:   Physical Exam Nursing note reviewed. Exam conducted with a chaperone present.  Constitutional:      Appearance: Normal appearance.  Eyes:  Extraocular Movements: Extraocular movements intact.     Conjunctiva/sclera: Conjunctivae normal.     Pupils: Pupils are equal, round, and reactive to light.  Skin:    General: Skin is warm and dry.  Neurological:     General: No focal deficit present.     Mental Status: She is alert and oriented to person, place, and time.     Comments: Motor strength is to minus at the left deltoid biceps triceps finger flexors and extensors Tone is increased in the finger flexors with clonus.  There is also resting tremor in the right index finger.  No evidence of clonus in the ankle or foot area.  Ambulates with a quad cane and contact-guard assistance.  She has no evidence of toe drag or knee instability.  She does use a foot up orthosis.  No evidence of clonus during gait.  No evidence of equina varus positioning during ambulation.  Psychiatric:        Mood and Affect: Mood normal.        Behavior: Behavior normal.           Assessment & Plan:  #1.  Recurrent right MCA distribution infarct with worsening of left hemiparesis particularly affecting the left upper extremity. The patient has been  making some progress in her rehab she has not been able to regain her prior level of functioning. She will continue with outpatient PT OT 2.  Spasticity related to CVA particularly in the finger flexor and wrist flexors of the left upper semi as well as elbow flexors.  Plan Botox to the final muscle groups Left FDS 50 units Left FDP 25 units Left FCR 50 units Left FPL 25 units Left biceps 50 units

## 2019-03-25 ENCOUNTER — Ambulatory Visit (HOSPITAL_COMMUNITY): Payer: Medicare Other | Attending: Pulmonary Disease | Admitting: Occupational Therapy

## 2019-03-25 ENCOUNTER — Encounter (HOSPITAL_COMMUNITY): Payer: Self-pay | Admitting: Occupational Therapy

## 2019-03-25 ENCOUNTER — Other Ambulatory Visit: Payer: Self-pay

## 2019-03-25 DIAGNOSIS — R29818 Other symptoms and signs involving the nervous system: Secondary | ICD-10-CM | POA: Diagnosis present

## 2019-03-25 DIAGNOSIS — R278 Other lack of coordination: Secondary | ICD-10-CM | POA: Diagnosis present

## 2019-03-25 DIAGNOSIS — M25612 Stiffness of left shoulder, not elsewhere classified: Secondary | ICD-10-CM | POA: Diagnosis present

## 2019-03-25 DIAGNOSIS — R2689 Other abnormalities of gait and mobility: Secondary | ICD-10-CM | POA: Diagnosis present

## 2019-03-25 DIAGNOSIS — R29898 Other symptoms and signs involving the musculoskeletal system: Secondary | ICD-10-CM | POA: Diagnosis present

## 2019-03-25 DIAGNOSIS — M6281 Muscle weakness (generalized): Secondary | ICD-10-CM | POA: Diagnosis present

## 2019-03-25 NOTE — Therapy (Signed)
[facility] [place], [place], [address] Phone: [phone]   Fax:  [phone]  Occupational Therapy Treatment  Patient Details  Name: Danielle [name] MRN: [id number] Date of Birth: 1932-04-11 Referring Provider (OT): Dr. Sinda Du   Encounter Date: 03/25/2019  OT End of Session - 03/25/19 1524    Visit Number  7    Number of Visits  16    Date for OT Re-Evaluation  04/25/19   mini reassess: 03/28/19   Authorization Type  [employer] Medicare Copay $35 No visit limit.    Authorization Time Period  progress note at visit 10    Authorization - Visit Number  7    Authorization - Number of Visits  10    OT Start Time  [id number]    OT Stop Time  1515    OT Time Calculation (min)  43 min    Activity Tolerance  Patient tolerated treatment well    Behavior During Therapy  WFL for tasks assessed/performed       Past Medical History:  Diagnosis Date  . Arthritis    "some joint pain once in awhile" (05/26/2017)  . CVA (cerebral vascular accident) ([facility]) 2019  . History of kidney stones   . Hyperlipemia   . Hypertension   . Hypothyroidism (acquired)   . Pneumothorax 02/05/2017   right-after fall  . Rib fractures 02/05/2017   right side  . Stroke ([place])   . Type II diabetes mellitus ([place])     Past Surgical History:  Procedure Laterality Date  . AUGMENTATION MAMMAPLASTY Bilateral   . DILATION AND CURETTAGE OF UTERUS    . FRACTURE SURGERY    . REVERSE SHOULDER ARTHROPLASTY Right 05/26/2017  . REVERSE SHOULDER ARTHROPLASTY Right 05/26/2017   Procedure: REVERSE RIGHT SHOULDER ARTHROPLASTY;  Surgeon: Meredith Pel, MD;  Location: [facility];  Service: Orthopedics;  Laterality: Right;  . TONSILLECTOMY    . TUBAL LIGATION      There were no vitals filed for this visit.  Subjective Assessment - 03/25/19 1436    Subjective   S: The doctor wants to do some botox.    Currently in Pain?  No/denies         [facility] OT Assessment -  03/25/19 1436      Assessment   Medical Diagnosis  Left arm weakness      Precautions   Precautions  Fall;Other (comment)    Precaution Comments  left hemiparesis               OT Treatments/Exercises (OP) - 03/25/19 1437      ADLs   ADL Comments  Working on hand washing at beginning of session incorporating left hand into task. Verbal cuing to square up to sink and stand steadily prior to reaching for soap. Min assist to bring left arm forward and hold over sink. After finishing pt held paper towel with left hand and walked to trashcan, used right hand to throw paper towel away.       Exercises   Exercises  Shoulder      Neurological Re-education Exercises   Other Exercises 1  With Left hand placed on pink ball, patient completed AA/ROM (shoulder flexion/extension) while therapist provided support of LUE to remain on ball. Muscle tapping completed to faciliate activation. 10X    Other Exercises 2  Completed A/ROM shoulder elevation (shoulder shrug holds) during on ramp of NMES with therapist providing verbal cuing to shrug and  hold, followed by relax.    Seated with weight on forearm  seated weightbearing completed with left UE placed to the side out of flexor synergy. Forearm placed on medium size ball to provide visual feedback on amount of weightbearing while putting Squigz on and off floor length mirror. Completed while reaching towards the left.      Closed Chain Exercises  Pt seated on mat table, arm positioned on medium pink ball. Pt pushing into flexion, scaption, and abduction with OT assist to support LUE on ball. Completed 10X each direction, least difficulty with flexion, most with abduction      Modalities   Modalities  Electrical Stimulation      Electrical Stimulation   Electrical Stimulation Location  left upper trapezius region    Chartered certified accountant  Russian    Electrical Stimulation Parameters  23 mA CC 8'    Printmaker Goals   Neuromuscular facilitation               OT Short Term Goals - 03/04/19 1657      OT SHORT TERM GOAL #1   Title  Patient and family will be provided and educated on HEP as well as any equipment or adaptive strategies to help aid with increasing the functional use of her LUE during ADL tasks.    Time  4    Period  Weeks    Status  On-going    Target Date  03/28/19      OT SHORT TERM GOAL #2   Title  Patient will increase LUE P/ROM to [facility] in order to increase ability to complete dressing tasks.    Time  4    Period  Weeks    Status  On-going      OT SHORT TERM GOAL #3   Title  Patient will increase her left hand grip strength to 10# and her pinch to 5# in order to use her left hand to stabilize an object such as a cup or plate while eating.    Time  4    Period  Weeks    Status  On-going      OT SHORT TERM GOAL #4   Title  Patient will demonstrate/report the ability to use her LUE as a stabilizer during a basic daily task such as during meals or grooming.    Time  4    Period  Weeks    Status  On-going        OT Long Term Goals - 03/04/19 1657      OT LONG TERM GOAL #1   Title  patient will demonstrate the ability to use her left UE as an active assist during daily tasks.    Time  8    Period  Weeks    Status  On-going      OT LONG TERM GOAL #2   Title  Patient will increase her LUE A/ROM to allow her to complete functional tasks at waist level with less difficulty.    Time  8    Period  Weeks    Status  On-going      OT LONG TERM GOAL #3   Title  Patient will increase her left hand strength to 15# and her pinch strength to 8# in order to hold on to items with greater stability and less dropping.    Time  8    Period  Weeks    Status  On-going  OT LONG TERM GOAL #4   Title  Patient will demonstrate increased coordination by completing the 9 hole peg test with therapist providing set up of pegs.    Time  8    Period  Weeks    Status  On-going       OT LONG TERM GOAL #5   Title  Pt will improve her LUE strength to 3/5 in order to complete low level functional reaching tasks while at home requiring greater independence.    Time  8    Period  Weeks    Status  On-going            Plan - 03/25/19 1524    Clinical Impression Statement  A: NMES at beginning of session to work on trapezius activation. Continued with weightbearing and weightshifting as well as AA/ROM today. Pt with improvement in active muscle facilitation using medium pink ball. Added scaption and abduction with pink therapy ball, max difficulty with abduction. Pt reports MD is planning on trialing botox on 11/17, educated on holding therapy on LUE for 14 days after botox. Pt verbalized understanding.    Body Structure / Function / Physical Skills  Balance;ADL;UE functional use;Fascial restriction;Flexibility;FMC;Proprioception;ROM;Coordination;GMC;Sensation;Decreased knowledge of use of DME;IADL;Strength;Edema;Tone    Plan  P: Mini-reassessment. Continue with NMES, weightbearing/weightshifting. Adjust schedule if pt definately planning to attend botox appt       Patient will benefit from skilled therapeutic intervention in order to improve the following deficits and impairments:   Body Structure / Function / Physical Skills: Balance, ADL, UE functional use, Fascial restriction, Flexibility, FMC, Proprioception, ROM, Coordination, GMC, Sensation, Decreased knowledge of use of DME, IADL, Strength, Edema, Tone       Visit Diagnosis: Other lack of coordination  Other symptoms and signs involving the musculoskeletal system    Problem List Patient Active Problem List   Diagnosis Date Noted  . Right middle cerebral artery stroke ([place]) 01/03/2019  . Carotid stenosis, bilateral 12/30/2018  . Stroke ([place]) 12/24/2018  . Stroke-like symptoms 12/23/2018  . Generalized weakness 06/09/2018  . Hypothyroidism (acquired) 05/03/2018  . Hypertension 05/03/2018  . Uncontrolled  type 2 diabetes mellitus with diabetic nephropathy, with long-term current use of insulin ([place])   . Back pain at L4-L5 level 06/14/2013  . Muscle weakness (generalized) 12/17/2012  . Pain in joint, shoulder region 12/17/2012  . Shoulder fracture 12/14/2012  . Proximal humerus fracture 11/29/2012  . FRACTURE, TOE, RIGHT 09/10/2009   Guadelupe Sabin, OTR/L  [phone] 03/25/2019, 3:28 PM  [facility] [address] [place], [place], [address] Phone: [phone]   Fax:  [phone]  Name: Danielle [name] MRN: [id number] Date of Birth: 12-Dec-1931

## 2019-03-28 ENCOUNTER — Ambulatory Visit (HOSPITAL_COMMUNITY): Payer: Medicare Other

## 2019-03-30 ENCOUNTER — Ambulatory Visit (HOSPITAL_COMMUNITY): Payer: Medicare Other

## 2019-03-30 ENCOUNTER — Encounter (HOSPITAL_COMMUNITY): Payer: Self-pay | Admitting: Physical Therapy

## 2019-03-30 ENCOUNTER — Ambulatory Visit (HOSPITAL_COMMUNITY): Payer: Medicare Other | Admitting: Physical Therapy

## 2019-03-30 ENCOUNTER — Other Ambulatory Visit: Payer: Self-pay

## 2019-03-30 DIAGNOSIS — M25612 Stiffness of left shoulder, not elsewhere classified: Secondary | ICD-10-CM

## 2019-03-30 DIAGNOSIS — R29898 Other symptoms and signs involving the musculoskeletal system: Secondary | ICD-10-CM

## 2019-03-30 DIAGNOSIS — R2689 Other abnormalities of gait and mobility: Secondary | ICD-10-CM

## 2019-03-30 DIAGNOSIS — R278 Other lack of coordination: Secondary | ICD-10-CM

## 2019-03-30 DIAGNOSIS — R29818 Other symptoms and signs involving the nervous system: Secondary | ICD-10-CM

## 2019-03-30 DIAGNOSIS — M6281 Muscle weakness (generalized): Secondary | ICD-10-CM

## 2019-03-30 NOTE — Therapy (Signed)
[place], [place], [address] Phone: [phone]   Fax:  [phone]  Physical Therapy Evaluation  Patient Details  Name: Danielle [name] MRN: [id number] Date of Birth: 06-21-1931 Referring Provider (PT): Sinda Du   Encounter Date: 03/30/2019  PT End of Session - 03/30/19 1529    Visit Number  1    Number of Visits  16    Date for PT Re-Evaluation  05/25/19   mini reassess:04/27/19   Authorization Type  U[employer] Medicare - based on medical necessity    Authorization Time Period  complete progress note at visit 10; 03/30/19-05/24/2018    Authorization - Visit Number  1    Authorization - Number of Visits  10    PT Start Time  1430    PT Stop Time  [id number]    PT Time Calculation (min)  48 min    Equipment Utilized During Treatment  Gait belt;Other (comment)    Activity Tolerance  Patient tolerated treatment well    Behavior During Therapy  [facility] for tasks assessed/performed       Past Medical History:  Diagnosis Date  . Arthritis    "some joint pain once in awhile" (05/26/2017)  . CVA (cerebral vascular accident) ([place]) 2019  . History of kidney stones   . Hyperlipemia   . Hypertension   . Hypothyroidism (acquired)   . Pneumothorax 02/05/2017   right-after fall  . Rib fractures 02/05/2017   right side  . Stroke ([place])   . Type II diabetes mellitus ([place])     Past Surgical History:  Procedure Laterality Date  . AUGMENTATION MAMMAPLASTY Bilateral   . DILATION AND CURETTAGE OF UTERUS    . FRACTURE SURGERY    . REVERSE SHOULDER ARTHROPLASTY Right 05/26/2017  . REVERSE SHOULDER ARTHROPLASTY Right 05/26/2017   Procedure: REVERSE RIGHT SHOULDER ARTHROPLASTY;  Surgeon: Meredith Pel, MD;  Location: [facility];  Service: Orthopedics;  Laterality: Right;  . TONSILLECTOMY    . TUBAL LIGATION      There were no vitals filed for this visit.   Subjective Assessment - 03/30/19 1439    Subjective  Patient is an  85 y.o female who presents to physical therapy s/p CVA for gait/balance deficits with the most recent being about a month ago. She reports she has been doing exercises given to her for previous CVA with her daughter which consist of LE exercises and walking. Since her stroke she has been having both UE/LE deficits. She feels that her lower extremity is weak and she is unable to stand by herself due to fear of falling. Her husband has been her caregiver as well as her daughter. She uses her cane while walking. Her main goal is to be able to stand up and walk by herself.    Pertinent History  CVA, diabetes    Limitations  Standing;Walking;House hold activities    Patient Stated Goals  stand up and walk by herself    Currently in Pain?  No/denies         [facility] PT Assessment - 03/30/19 1443      Assessment   Medical Diagnosis  Gait and balance deficits s/p CVA    Referring Provider (PT)  Sinda Du    Onset Date/Surgical Date  01/03/19    Prior Therapy  OT for UE, previous PT after earlier stroke      Precautions   Precautions  Fall    Precaution Comments  left hemiparesis      Balance Screen   Has the patient fallen in the past 6 months  No    Has the patient had a decrease in activity level because of a fear of falling?   Yes    Is the patient reluctant to leave their home because of a fear of falling?   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      Cognition   Overall Cognitive Status  Within Functional Limits for tasks assessed      Observation/Other Assessments   Observations  Patient has velcro AFO on L ankle, unable to use LU and keeps at side      ROM / Strength   AROM / PROM / Strength  Strength      AROM   Overall AROM Comments  LE within functional limits      Strength    Strength Assessment Site  Hip;Knee;Ankle    Right/Left Hip  Right;Left    Right Hip Flexion  3+/5    Left Hip Flexion  3+/5    Right/Left Knee  Right;Left    Right Knee Flexion  5/5    Right Knee Extension  5/5    Left Knee Flexion  4+/5    Left Knee Extension  4+/5    Right/Left Ankle  Right;Left    Right Ankle Dorsiflexion  5/5    Left Ankle Dorsiflexion  4/5      Transfers   Five time sit to stand comments   18.69 seconds      Ambulation/Gait   Ambulation/Gait  Yes    Ambulation Distance (Feet)  150 Feet    Assistive device  Large base quad cane    Gait Pattern  Step-through pattern;Decreased step length - right;Decreased step length - left;Decreased stride length;Poor foot clearance - left    Ambulation Surface  Level    Gait velocity  .38 m/s    Gait Comments  2 MWT + 150 feet with verbal cueing for mechanics      Balance   Balance Assessed  Yes      Standardized Balance Assessment   Standardized Balance Assessment  Berg Balance Test      Berg Balance Test   Sit to Stand  Able to stand  independently using hands    Standing Unsupported  Able to stand 2 minutes with supervision    Sitting with Back Unsupported but Feet Supported on Floor or Stool  Able to sit safely and securely 2 minutes    Stand to Sit  Controls descent by using hands    Transfers  Able to transfer safely, definite need of hands    Standing Unsupported with Eyes Closed  Able to stand 10 seconds with supervision    Standing Unsupported with Feet Together  Able to place feet together independently and stand for 1 minute with supervision    From Standing, Reach Forward with Outstretched Arm  Can reach forward >12 cm safely (5")    From Standing Position, Pick up Object from Floor  Able to pick up shoe, needs supervision    From Standing Position, Turn to Look Behind Over each Shoulder  Looks behind from both sides and weight shifts well    Turn 360 Degrees  Needs close supervision  or verbal cueing     Standing Unsupported, Alternately Place Feet on Step/Stool  Needs assistance to keep from falling or unable to try    Standing Unsupported, One Foot in [employer] balance while stepping or standing    Standing on One Leg  Unable to try or needs assist to prevent fall    Total Score  33                Objective measurements completed on examination: See above findings.              PT Education - 03/30/19 1528    Education Details  Patient educated on exam findings, POC    Person(s) Educated  Patient    Methods  Explanation    Comprehension  Verbalized understanding       PT Short Term Goals - 03/30/19 1621      PT SHORT TERM GOAL #1   Title  Pt will be independent with HEP and perform consistently in order to improve functional outcomes    Time  4    Period  Weeks    Status  New    Target Date  04/27/19      PT SHORT TERM GOAL #2   Title  Patient will be able to complete 5x STS in under 14.8 seconds in order demonstrate improved LE strength to reduce the risk of falls.    Baseline  18.69    Time  4    Period  Weeks    Status  New    Target Date  04/27/19        PT Long Term Goals - 03/30/19 1629      PT LONG TERM GOAL #1   Title  Patient will increase score by at least 7 points on Berg balance scale in order to reduce the risk of falls.    Baseline  03/30/19: 33/56    Time  8    Status  New    Target Date  05/25/19      PT LONG TERM GOAL #2   Title  Pt will be able to perform bil tandem stance for greater than 10 seconds without UE support in order to demo improved functional strength and balance.    Baseline  04/16/19: unable to maintain without UE support    Time  8    Period  Weeks    Status  New    Target Date  05/25/19      PT LONG TERM GOAL #3   Title  Patient will demonstrate ability to perform sit to stand transfer with modified independence without fear of falling to improve overall functional mobility.    Time  8    Period   Weeks    Status  New    Target Date  05/25/19             Plan - 03/30/19 1554    Clinical Impression Statement  Patient is an 85 y.o female who presents to physical therapy s/p CVA for gait/balance deficits with the most recent being about a month ago. She presents with deficits in LE strength, balance, gait, and impaired functional mobility with ADL. She is having to modify/restrict ADL as indicated by subjective and objective information which is affecting overall participation. She is at a very high risk of falls as indicated by scores in [place] test and 5x STS. Patient will benefit from skilled physical therapy in order to improve  function and reduce impairment.    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/Moderate complexity    Clinical Decision Making  Moderate    Rehab Potential  Fair    PT Frequency  2x / week    PT Duration  8 weeks    PT Treatment/Interventions  ADLs/Self Care Home Management;Aquatic Therapy;Biofeedback;Electrical Stimulation;Iontophoresis 4mg /ml Dexamethasone;Moist Heat;Traction;Ultrasound;Parrafin;Fluidtherapy;Contrast Bath;DME Instruction;Gait training;Stair training;Functional mobility training;Therapeutic activities;Therapeutic exercise;Balance training;Neuromuscular re-education;Patient/family education;Orthotic Fit/Training;Manual techniques;Compression bandaging;Passive range of motion;Dry needling;Energy conservation;Splinting;Taping;Spinal Manipulations;Joint Manipulations    PT Next Visit Plan  Initiate balance/gait exercise and LE strenghtening as tolerated    PT Home  Exercise Plan  initiate HEP next session    Consulted and Agree with Plan of Care  Patient       Patient will benefit from skilled therapeutic intervention in order to improve the following deficits and impairments:  Abnormal gait, Decreased activity tolerance, Decreased balance, Decreased endurance, Decreased mobility, Decreased range of motion, Decreased strength, Difficulty walking, Impaired tone, Improper body mechanics  Visit Diagnosis: Other symptoms and signs involving the nervous system  Other abnormalities of gait and mobility  Muscle weakness (generalized)     Problem List Patient Active Problem List   Diagnosis Date Noted  . 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 Carotid stenosis, bilateral 12/30/2018  . Stroke ([place]) 12/24/2018  . Stroke-like symptoms 12/23/2018  . Generalized weakness 06/09/2018  . Hypothyroidism (acquired) 05/03/2018  . Hypertension 05/03/2018  . Uncontrolled type 2 diabetes mellitus with diabetic nephropathy, with long-term current use of insulin ([place])   . Back pain at L4-L5 level 06/14/2013  . Muscle weakness (generalized) 12/17/2012  . Pain in joint, shoulder region 12/17/2012  . Shoulder fracture 12/14/2012  . Proximal humerus fracture 11/29/2012  . FRACTURE, TOE, RIGHT 09/10/2009    Margie Billet PT, DPT  [facility] [address] [place], [place], [address] Phone: [phone]   Fax:  [phone]  Name: Danielle [name] MRN: [id number] Date of Birth: [birth year]

## 2019-03-31 ENCOUNTER — Encounter (HOSPITAL_COMMUNITY): Payer: Self-pay

## 2019-03-31 NOTE — Therapy (Signed)
[facility] [place], [place], [address] Phone: [phone]   Fax:  [phone]  Occupational Therapy Treatment  Patient Details  Name: Danielle [name] MRN: [id number] Date of Birth: 1932/04/16 Referring Provider (OT): Dr. Sinda Du  Progress Note Reporting Period 02/28/2019 to 03/30/2019  See note below for Objective Data and Assessment of Progress/Goals.       Encounter Date: 03/30/2019  OT End of Session - 03/31/19 1343    Visit Number  8    Number of Visits  16    Date for OT Re-Evaluation  04/25/19    Authorization Type  [employer] Medicare Copay $35 No visit limit.    Authorization Time Period  progress note at visit 18    Authorization - Visit Number  8    Authorization - Number of Visits  18    OT Start Time  1350    OT Stop Time  1430    OT Time Calculation (min)  40 min    Activity Tolerance  Patient tolerated treatment well    Behavior During Therapy  WFL for tasks assessed/performed       Past Medical History:  Diagnosis Date  . Arthritis    "some joint pain once in awhile" (05/26/2017)  . CVA (cerebral vascular accident) ([place]) 2019  . History of kidney stones   . Hyperlipemia   . Hypertension   . Hypothyroidism (acquired)   . Pneumothorax 02/05/2017   right-after fall  . Rib fractures 02/05/2017   right side  . Stroke ([place])   . Type II diabetes mellitus ([place])     Past Surgical History:  Procedure Laterality Date  . AUGMENTATION MAMMAPLASTY Bilateral   . DILATION AND CURETTAGE OF UTERUS    . FRACTURE SURGERY    . REVERSE SHOULDER ARTHROPLASTY Right 05/26/2017  . REVERSE SHOULDER ARTHROPLASTY Right 05/26/2017   Procedure: REVERSE RIGHT SHOULDER ARTHROPLASTY;  Surgeon: Meredith Pel, MD;  Location: [facility];  Service: Orthopedics;  Laterality: Right;  . TONSILLECTOMY    . TUBAL LIGATION      There were no vitals filed for this visit.  Subjective Assessment - 03/31/19 1335    Subjective   S: I don't walk that much at home because i can't do it alone.    Currently in Pain?  No/denies         [facility] OT Assessment - 03/31/19 1336      Assessment   Medical Diagnosis  Left arm weakness    Referring Provider (OT)  Dr. Sinda Du      Precautions   Precautions  Fall    Precaution Comments  left hemiparesis      AROM   Overall AROM Comments  --    AROM Assessment Site  --    Right/Left Shoulder  --      PROM   Overall PROM Comments  Assessed seated. IR/er adducted. At eval: patient was able to achieve shoulder flexion and abduction to approximately 90 degrees.     PROM Assessment Site  Shoulder    Right/Left Shoulder  Left    Left Shoulder Flexion  132 Degrees    Left Shoulder ABduction  125 Degrees    Left Shoulder Internal Rotation  90 Degrees    Left Shoulder External Rotation  50 Degrees      Strength   Left Hand Lateral Pinch  4 lbs   previous: 2  OT Treatments/Exercises (OP) - 03/31/19 1337      Exercises   Exercises  Shoulder      Neurological Re-education Exercises   Other Exercises 2  Completed A/ROM shoulder elevation (shoulder shrug holds) during on ramp of NMES with therapist providing verbal cuing to shrug and hold, followed by relax.     Closed Chain Exercises  Seated on mat table with arm positioned out of flexor synergy. Patient completed isometric hold for shoulder flexion and abduction: 1x10". AA/ROM horizontal abduction/adduction 10X      Modalities   Modalities  Teacher, English as a foreign language Location  left upper trapezius region    Clinical biochemist Parameters  29 mA CC 8'    Printmaker Goals  Neuromuscular facilitation               OT Short Term Goals - 03/31/19 1344      OT SHORT TERM GOAL #1   Title  Patient and family will be provided and educated on HEP as well as any equipment  or adaptive strategies to help aid with increasing the functional use of her LUE during ADL tasks.    Time  4    Period  Weeks    Status  Achieved    Target Date  03/28/19      OT SHORT TERM GOAL #2   Title  Patient will increase LUE P/ROM to [facility] in order to increase ability to complete dressing tasks.    Time  4    Period  Weeks    Status  On-going      OT SHORT TERM GOAL #3   Title  Patient will increase her left hand grip strength to 10# and her pinch to 5# in order to use her left hand to stabilize an object such as a cup or plate while eating.    Time  4    Period  Weeks    Status  On-going      OT SHORT TERM GOAL #4   Title  Patient will demonstrate/report the ability to use her LUE as a stabilizer during a basic daily task such as during meals or grooming.    Time  4    Period  Weeks    Status  On-going        OT Long Term Goals - 03/04/19 1657      OT LONG TERM GOAL #1   Title  patient will demonstrate the ability to use her left UE as an active assist during daily tasks.    Time  8    Period  Weeks    Status  On-going      OT LONG TERM GOAL #2   Title  Patient will increase her LUE A/ROM to allow her to complete functional tasks at waist level with less difficulty.    Time  8    Period  Weeks    Status  On-going      OT LONG TERM GOAL #3   Title  Patient will increase her left hand strength to 15# and her pinch strength to 8# in order to hold on to items with greater stability and less dropping.    Time  8    Period  Weeks    Status  On-going      OT LONG TERM GOAL #4   Title  Patient will demonstrate increased coordination  by completing the 9 hole peg test with therapist providing set up of pegs.    Time  8    Period  Weeks    Status  On-going      OT LONG TERM GOAL #5   Title  Pt will improve her LUE strength to 3/5 in order to complete low level functional reaching tasks while at home requiring greater independence.    Time  8    Period  Weeks     Status  On-going            Plan - 03/31/19 1345    Clinical Impression Statement  A: 4 week progress note completed this date. Patient is unable to complete active scapular elevation although she is able to complete slight scapular retraction. Use of NMES has been utilized during sessions to faciliate muscle activation in the shoulder and scapular region. Pt has made progress with P/ROM of her shoulder. She continues to demonstrate flexor synergy with an appointment with Dr. Letta Pate for Botox later this month. Sessions continue to focus on weightbearing, and nueromuscular faciliation in order to increase her ability to use her LUE as a stabilizer during daily tasks.    Body Structure / Function / Physical Skills  Balance;ADL;UE functional use;Fascial restriction;Flexibility;FMC;Proprioception;ROM;Coordination;GMC;Sensation;Decreased knowledge of use of DME;IADL;Strength;Edema;Tone    Plan  P: Continue to focus on mentioned deficits. NMES to LUE as well as weightbearing/shifting.    Consulted and Agree with Plan of Care  Patient       Patient will benefit from skilled therapeutic intervention in order to improve the following deficits and impairments:   Body Structure / Function / Physical Skills: Balance, ADL, UE functional use, Fascial restriction, Flexibility, FMC, Proprioception, ROM, Coordination, GMC, Sensation, Decreased knowledge of use of DME, IADL, Strength, Edema, Tone       Visit Diagnosis: Stiffness of left shoulder, not elsewhere classified  Other symptoms and signs involving the musculoskeletal system  Other lack of coordination    Problem List Patient Active Problem List   Diagnosis Date Noted  . 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 Carotid stenosis, bilateral 12/30/2018  . Stroke ([place]) 12/24/2018  . Stroke-like symptoms 12/23/2018  . Generalized weakness 06/09/2018  . Hypothyroidism (acquired) 05/03/2018  . Hypertension 05/03/2018  .  Uncontrolled type 2 diabetes mellitus with diabetic nephropathy, with long-term current use of insulin ([place])   . Back pain at L4-L5 level 06/14/2013  . Muscle weakness (generalized) 12/17/2012  . Pain in joint, shoulder region 12/17/2012  . Shoulder fracture 12/14/2012  . Proximal humerus fracture 11/29/2012  . FRACTURE, TOE, RIGHT 09/10/2009   Ailene Ravel, OTR/L,CBIS  [phone]  03/31/2019, 2:12 PM  [facility] [address] [place], [place], [address] Phone: [phone]   Fax:  [phone]  Name: Danielle [name] MRN: [id number] Date of Birth: Aug 15, 1931

## 2019-04-04 ENCOUNTER — Ambulatory Visit (HOSPITAL_COMMUNITY): Payer: Medicare Other

## 2019-04-04 ENCOUNTER — Other Ambulatory Visit: Payer: Self-pay

## 2019-04-04 ENCOUNTER — Encounter (HOSPITAL_COMMUNITY): Payer: Self-pay

## 2019-04-04 DIAGNOSIS — R278 Other lack of coordination: Secondary | ICD-10-CM | POA: Diagnosis not present

## 2019-04-04 DIAGNOSIS — R29898 Other symptoms and signs involving the musculoskeletal system: Secondary | ICD-10-CM

## 2019-04-04 DIAGNOSIS — R29818 Other symptoms and signs involving the nervous system: Secondary | ICD-10-CM

## 2019-04-04 DIAGNOSIS — M25612 Stiffness of left shoulder, not elsewhere classified: Secondary | ICD-10-CM

## 2019-04-04 NOTE — Therapy (Signed)
[facility] [place], [place], [address] Phone: [phone]   Fax:  [phone]  Occupational Therapy Treatment  Patient Details  Name: Danielle [name] MRN: [id number] Date of Birth: 08-21-1931 Referring Provider (OT): Dr. Sinda Du   Encounter Date: 04/04/2019  OT End of Session - 04/04/19 1454    Visit Number  9    Number of Visits  16    Date for OT Re-Evaluation  04/25/19    Authorization Type  [employer] Medicare Copay $35 No visit limit.    Authorization Time Period  progress note at visit 18    Authorization - Visit Number  9    Authorization - Number of Visits  18    OT Start Time  [id number]    OT Stop Time  1425    OT Time Calculation (min)  40 min    Activity Tolerance  Patient tolerated treatment well    Behavior During Therapy  WFL for tasks assessed/performed       Past Medical History:  Diagnosis Date  . Arthritis    "some joint pain once in awhile" (05/26/2017)  . CVA (cerebral vascular accident) ([place]) 2019  . History of kidney stones   . Hyperlipemia   . Hypertension   . Hypothyroidism (acquired)   . Pneumothorax 02/05/2017   right-after fall  . Rib fractures 02/05/2017   right side  . Stroke ([place])   . Type II diabetes mellitus ([place])     Past Surgical History:  Procedure Laterality Date  . AUGMENTATION MAMMAPLASTY Bilateral   . DILATION AND CURETTAGE OF UTERUS    . FRACTURE SURGERY    . REVERSE SHOULDER ARTHROPLASTY Right 05/26/2017  . REVERSE SHOULDER ARTHROPLASTY Right 05/26/2017   Procedure: REVERSE RIGHT SHOULDER ARTHROPLASTY;  Surgeon: Meredith Pel, MD;  Location: [facility];  Service: Orthopedics;  Laterality: Right;  . TONSILLECTOMY    . TUBAL LIGATION      There were no vitals filed for this visit.  Subjective Assessment - 04/04/19 1440    Subjective   S: I get my Botox on Thursday.    Currently in Pain?  No/denies         [facility] OT Assessment - 04/04/19 1441      Assessment    Medical Diagnosis  Left arm weakness      Precautions   Precautions  Fall    Precaution Comments  left hemiparesis    Required Braces or Orthoses  Other Brace/Splint    Other Brace/Splint  night time resting hand splint. thumb brace for extension during the day                OT Treatments/Exercises (OP) - 04/04/19 1441      Exercises   Exercises  Shoulder      Neurological Re-education Exercises   Forearm Supination  Seated;10 reps;Left;AROM    Forearm Pronation  Seated;10 reps;AAROM    Wrist Flexion  Seated;10 reps;AROM    Wrist Extension  Seated;10 reps;Left;AAROM    Finger Extension  Seated; 10X; left; A/ROM    Other Exercises 1  Seated; shoulder row; AA/ROM to unweight arm; 10X; left    Other Exercises 2  During on ramp, Patient actively worked on extended her fingers along with the ES during the 8' treatment.       Modalities   Modalities  Electrical engineer Stimulation Location  Left wrist and  finger extensors    Electrical Stimulation Action  Engineer, petroleum Parameters  58 mA CC 8'    Printmaker Goals  Neuromuscular facilitation               OT Short Term Goals - 04/04/19 1503      OT SHORT TERM GOAL #1   Title  Patient and family will be provided and educated on HEP as well as any equipment or adaptive strategies to help aid with increasing the functional use of her LUE during ADL tasks.    Time  4    Period  Weeks    Target Date  03/28/19      OT SHORT TERM GOAL #2   Title  Patient will increase LUE P/ROM to [facility] in order to increase ability to complete dressing tasks.    Time  4    Period  Weeks    Status  On-going      OT SHORT TERM GOAL #3   Title  Patient will increase her left hand grip strength to 10# and her pinch to 5# in order to use her left hand to stabilize an object such as a cup or plate while eating.    Time  4    Period  Weeks    Status  On-going       OT SHORT TERM GOAL #4   Title  Patient will demonstrate/report the ability to use her LUE as a stabilizer during a basic daily task such as during meals or grooming.    Time  4    Period  Weeks    Status  On-going        OT Long Term Goals - 03/04/19 1657      OT LONG TERM GOAL #1   Title  patient will demonstrate the ability to use her left UE as an active assist during daily tasks.    Time  8    Period  Weeks    Status  On-going      OT LONG TERM GOAL #2   Title  Patient will increase her LUE A/ROM to allow her to complete functional tasks at waist level with less difficulty.    Time  8    Period  Weeks    Status  On-going      OT LONG TERM GOAL #3   Title  Patient will increase her left hand strength to 15# and her pinch strength to 8# in order to hold on to items with greater stability and less dropping.    Time  8    Period  Weeks    Status  On-going      OT LONG TERM GOAL #4   Title  Patient will demonstrate increased coordination by completing the 9 hole peg test with therapist providing set up of pegs.    Time  8    Period  Weeks    Status  On-going      OT LONG TERM GOAL #5   Title  Pt will improve her LUE strength to 3/5 in order to complete low level functional reaching tasks while at home requiring greater independence.    Time  8    Period  Weeks    Status  On-going            Plan - 04/04/19 1454    Clinical Impression Statement  A: Continued to focus on faciliating muscle activation in the LUE with  US of the NMES unit. This session, NMES was used to increase ROM for wrist and finger extension. Completed A/ROM and AA/ROM following NMES with patient able to demonstate some muscle movement throughout partial ROM. Muscle tapping was used.    Body Structure / Function / Physical Skills  Balance;ADL;UE functional use;Fascial restriction;Flexibility;FMC;Proprioception;ROM;Coordination;GMC;Sensation;Decreased knowledge of use of  DME;IADL;Strength;Edema;Tone    Plan  P: Continue to work on AA/ROM during seated activities. Weightbearing/shifting.    Consulted and Agree with Plan of Care  Patient       Patient will benefit from skilled therapeutic intervention in order to improve the following deficits and impairments:   Body Structure / Function / Physical Skills: Balance, ADL, UE functional use, Fascial restriction, Flexibility, FMC, Proprioception, ROM, Coordination, GMC, Sensation, Decreased knowledge of use of DME, IADL, Strength, Edema, Tone       Visit Diagnosis: Other symptoms and signs involving the nervous system  Stiffness of left shoulder, not elsewhere classified  Other symptoms and signs involving the musculoskeletal system  Other lack of coordination    Problem List Patient Active Problem List   Diagnosis Date Noted  . 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 Carotid stenosis, bilateral 12/30/2018  . Stroke ([place]) 12/24/2018  . Stroke-like symptoms 12/23/2018  . Generalized weakness 06/09/2018  . Hypothyroidism (acquired) 05/03/2018  . Hypertension 05/03/2018  . Uncontrolled type 2 diabetes mellitus with diabetic nephropathy, with long-term current use of insulin ([place])   . Back pain at L4-L5 level 06/14/2013  . Muscle weakness (generalized) 12/17/2012  . Pain in joint, shoulder region 12/17/2012  . Shoulder fracture 12/14/2012  . Proximal humerus fracture 11/29/2012  . FRACTURE, TOE, RIGHT 09/10/2009   Ailene Ravel, OTR/L,CBIS  [phone]  04/04/2019, 3:05 PM  [facility] [address] [place], [place], [address] Phone: [phone]   Fax:  [phone]  Name: AMIEE SHIPES MRN: [id number] Date of Birth: June 07, 1931

## 2019-04-05 ENCOUNTER — Encounter (HOSPITAL_COMMUNITY): Payer: Self-pay | Admitting: Physical Therapy

## 2019-04-05 ENCOUNTER — Ambulatory Visit (HOSPITAL_COMMUNITY): Payer: Medicare Other | Admitting: Physical Therapy

## 2019-04-05 DIAGNOSIS — R278 Other lack of coordination: Secondary | ICD-10-CM | POA: Diagnosis not present

## 2019-04-05 DIAGNOSIS — M6281 Muscle weakness (generalized): Secondary | ICD-10-CM

## 2019-04-05 DIAGNOSIS — R2689 Other abnormalities of gait and mobility: Secondary | ICD-10-CM

## 2019-04-05 DIAGNOSIS — R29818 Other symptoms and signs involving the nervous system: Secondary | ICD-10-CM

## 2019-04-05 NOTE — Therapy (Signed)
[facility] [place], [place], [address] Phone: [phone]   Fax:  [phone]  Physical Therapy Treatment  Patient Details  Name: Danielle [name] MRN: [id number] Date of Birth: [birth year] Referring Provider (PT): Sinda Du   Encounter Date: 04/05/2019  PT End of Session - 04/05/19 1525    Visit Number  2    Number of Visits  16    Date for PT Re-Evaluation  05/25/19   mini reassess:04/27/19   Authorization Type  [employer] Medicare - based on medical necessity    Authorization Time Period  complete progress note at visit 10; 03/30/19-05/24/2018    Authorization - Visit Number  2    Authorization - Number of Visits  10    PT Start Time  1520    PT Stop Time  1605    PT Time Calculation (min)  45 min    Equipment Utilized During Treatment  Gait belt;Other (comment)   Quad cane   Activity Tolerance  Patient tolerated treatment well    Behavior During Therapy  WFL for tasks assessed/performed       Past Medical History:  Diagnosis Date  . Arthritis    "some joint pain once in awhile" (05/26/2017)  . CVA (cerebral vascular accident) ([place]) 2019  . History of kidney stones   . Hyperlipemia   . Hypertension   . Hypothyroidism (acquired)   . Pneumothorax 02/05/2017   right-after fall  . Rib fractures 02/05/2017   right side  . Stroke ([place])   . Type II diabetes mellitus ([place])     Past Surgical History:  Procedure Laterality Date  . AUGMENTATION MAMMAPLASTY Bilateral   . DILATION AND CURETTAGE OF UTERUS    . FRACTURE SURGERY    . REVERSE SHOULDER ARTHROPLASTY Right 05/26/2017  . REVERSE SHOULDER ARTHROPLASTY Right 05/26/2017   Procedure: REVERSE RIGHT SHOULDER ARTHROPLASTY;  Surgeon: Meredith Pel, MD;  Location: [facility];  Service: Orthopedics;  Laterality: Right;  . TONSILLECTOMY    . TUBAL LIGATION      There were no vitals filed for this visit.  Subjective Assessment - 04/05/19 1525    Subjective   Patient reports no new issues since evaluation. Patient says her balance "ain't too good" but denies any recent falls.    Pertinent History  CVA, diabetes    Limitations  Standing;Walking;House hold activities    Patient Stated Goals  stand up and walk by herself    Currently in Pain?  No/denies                       [facility] Adult PT Treatment/Exercise - 04/05/19 0001      Exercises   Exercises  Knee/Hip      Knee/Hip Exercises: Standing   Heel Raises  Both;15 reps    Gait Training  175 feet using quad cane; CG; cueing for improved LT foot clearance      Knee/Hip Exercises: Seated   Long Arc Quad  Both;15 reps    Other Seated Knee/Hip Exercises  Seated heel raise/ toe raises x15 each     Marching  Both;15 reps    Sit to Sand  2 sets;5 reps;with UE support          Balance Exercises - 04/05/19 1610      Balance Exercises: Standing   Standing Eyes Opened  Narrow base of support (BOS);Head turns;Solid surface   10 x each   Partial Tandem  Stance  Eyes open;Intermittent upper extremity support;3 reps;30 secs    Sidestepping  3 reps   3 RT at //    Other Standing Exercises  NBOS on foam 3 x 30" with intermittent HHA; alternating step taps on 6 inch box with HHA x1; 20 reps          PT Short Term Goals - 03/30/19 1621      PT SHORT TERM GOAL #1   Title  Pt will be independent with HEP and perform consistently in order to improve functional outcomes    Time  4    Period  Weeks    Status  New    Target Date  04/27/19      PT SHORT TERM GOAL #2   Title  Patient will be able to complete 5x STS in under 14.8 seconds in order demonstrate improved LE strength to reduce the risk of falls.    Baseline  18.69    Time  4    Period  Weeks    Status  New    Target Date  04/27/19        PT Long Term Goals - 03/30/19 1629      PT LONG TERM GOAL #1   Title  Patient will increase score by at least 7 points on Berg balance scale in order to reduce the risk of  falls.    Baseline  03/30/19: 33/56    Time  8    Status  New    Target Date  05/25/19      PT LONG TERM GOAL #2   Title  Pt will be able to perform bil tandem stance for greater than 10 seconds without UE support in order to demo improved functional strength and balance.    Baseline  04/16/19: unable to maintain without UE support    Time  8    Period  Weeks    Status  New    Target Date  05/25/19      PT LONG TERM GOAL #3   Title  Patient will demonstrate ability to perform sit to stand transfer with modified independence without fear of falling to improve overall functional mobility.    Time  8    Period  Weeks    Status  New    Target Date  05/25/19            Plan - 04/05/19 1615    Clinical Impression Statement  Patient tolerated session well. Began session with review of goals and HEP. Initiated seated strengthening for BLE. Progressed to standing strengthening as able. Patient was well challenged with balance activity. Patient required min gaurd for tandem stance, and frequnet cueing for avoiding leftward lean. Patient able to correct with cueing, but form degrades with onset of fatigue. Patient educated on proper form and funciton of all added activity today. Patient was bale to ambualte 175 feet in clinic using quad cane and therapist CG. Patient ambulated with slow cadence, decreased stride on LT, and requires frequent cueing for improving step length and ground clearance on LLE, due to LT foot drop.    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/Moderate complexity    Rehab Potential  Fair     PT  Frequency  2x / week    PT Duration  8 weeks    PT Treatment/Interventions  ADLs/Self Care Home Management;Aquatic Therapy;Biofeedback;Electrical Stimulation;Iontophoresis 4mg /ml Dexamethasone;Moist Heat;Traction;Ultrasound;Parrafin;Fluidtherapy;Contrast Bath;DME Instruction;Gait training;Stair training;Functional mobility training;Therapeutic activities;Therapeutic exercise;Balance training;Neuromuscular re-education;Patient/family education;Orthotic Fit/Training;Manual techniques;Compression bandaging;Passive range of motion;Dry needling;Energy conservation;Splinting;Taping;Spinal Manipulations;Joint Manipulations    PT Next Visit Plan  Continue to progress BLE strengthening in standing as tolerated. Progress balance and gait activity as able.    PT Home Exercise Plan  04/05/19: seated marching, seated HR/TR, LAQ    Consulted and Agree with Plan of Care  Patient       Patient will benefit from skilled therapeutic intervention in order to improve the following deficits and impairments:  Abnormal gait, Decreased activity tolerance, Decreased balance, Decreased endurance, Decreased mobility, Decreased range of motion, Decreased strength, Difficulty walking, Impaired tone, Improper body mechanics  Visit Diagnosis: Other symptoms and signs involving the nervous system  Other abnormalities of gait and mobility  Muscle weakness (generalized)     Problem List Patient Active Problem List   Diagnosis Date Noted  . 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 Carotid stenosis, bilateral 12/30/2018  . Stroke ([place]) 12/24/2018  . Stroke-like symptoms 12/23/2018  . Generalized weakness 06/09/2018  . Hypothyroidism (acquired) 05/03/2018  . Hypertension 05/03/2018  . Uncontrolled type 2 diabetes mellitus with diabetic nephropathy, with long-term current use of insulin ([place])   . Back pain at L4-L5 level 06/14/2013  . Muscle weakness (generalized) 12/17/2012  . Pain in joint, shoulder  region 12/17/2012  . Shoulder fracture 12/14/2012  . Proximal humerus fracture 11/29/2012  . FRACTURE, TOE, RIGHT 09/10/2009   4:24 PM, 04/05/19 Josue Hector PT DPT  Physical Therapist with [facility]  [phone] [place], [place], [address] Phone: [phone]   Fax:  [phone]  Name: CLOEY HESSMAN MRN: [id number] Date of Birth: 1931/06/18

## 2019-04-06 ENCOUNTER — Encounter (HOSPITAL_COMMUNITY): Payer: Self-pay

## 2019-04-06 ENCOUNTER — Ambulatory Visit (HOSPITAL_COMMUNITY): Payer: Medicare Other

## 2019-04-06 ENCOUNTER — Encounter (HOSPITAL_COMMUNITY): Payer: Self-pay | Admitting: Physical Therapy

## 2019-04-06 ENCOUNTER — Ambulatory Visit (HOSPITAL_COMMUNITY): Payer: Medicare Other | Admitting: Physical Therapy

## 2019-04-06 ENCOUNTER — Other Ambulatory Visit: Payer: Self-pay

## 2019-04-06 DIAGNOSIS — R29818 Other symptoms and signs involving the nervous system: Secondary | ICD-10-CM

## 2019-04-06 DIAGNOSIS — M25612 Stiffness of left shoulder, not elsewhere classified: Secondary | ICD-10-CM

## 2019-04-06 DIAGNOSIS — M6281 Muscle weakness (generalized): Secondary | ICD-10-CM

## 2019-04-06 DIAGNOSIS — R29898 Other symptoms and signs involving the musculoskeletal system: Secondary | ICD-10-CM

## 2019-04-06 DIAGNOSIS — R2689 Other abnormalities of gait and mobility: Secondary | ICD-10-CM

## 2019-04-06 DIAGNOSIS — R278 Other lack of coordination: Secondary | ICD-10-CM | POA: Diagnosis not present

## 2019-04-06 NOTE — Therapy (Signed)
[facility] [place], [place], [address] Phone: [phone]   Fax:  [phone]  Physical Therapy Treatment  Patient Details  Name: Danielle [name] MRN: [id number] Date of Birth: [birth year] Referring Provider (PT): Sinda Du   Encounter Date: 04/06/2019  PT End of Session - 04/06/19 1519    Visit Number  3    Number of Visits  16    Date for PT Re-Evaluation  05/25/19   mini reassess:04/27/19   Authorization Type  [employer] Medicare - based on medical necessity    Authorization Time Period  complete progress note at visit 10; 03/30/19-05/24/2018    Authorization - Visit Number  3    Authorization - Number of Visits  10    PT Start Time  1430    PT Stop Time  1510    PT Time Calculation (min)  40 min    Equipment Utilized During Treatment  Gait belt;Other (comment)   Quad cane   Activity Tolerance  Patient tolerated treatment well;Patient limited by fatigue    Behavior During Therapy  [facility] for tasks assessed/performed       Past Medical History:  Diagnosis Date  . Arthritis    "some joint pain once in awhile" (05/26/2017)  . CVA (cerebral vascular accident) ([place]) 2019  . History of kidney stones   . Hyperlipemia   . Hypertension   . Hypothyroidism (acquired)   . Pneumothorax 02/05/2017   right-after fall  . Rib fractures 02/05/2017   right side  . Stroke ([place])   . Type II diabetes mellitus ([place])     Past Surgical History:  Procedure Laterality Date  . AUGMENTATION MAMMAPLASTY Bilateral   . DILATION AND CURETTAGE OF UTERUS    . FRACTURE SURGERY    . REVERSE SHOULDER ARTHROPLASTY Right 05/26/2017  . REVERSE SHOULDER ARTHROPLASTY Right 05/26/2017   Procedure: REVERSE RIGHT SHOULDER ARTHROPLASTY;  Surgeon: Meredith Pel, MD;  Location: [facility];  Service: Orthopedics;  Laterality: Right;  . TONSILLECTOMY    . TUBAL LIGATION      There were no vitals filed for this visit.  Subjective Assessment - 04/06/19  1430    Subjective  Patient states she has been feeling pretty good. Patient does not feel too tired after last session. She has been trying to do her exercises at home.    Pertinent History  CVA, diabetes    Limitations  Standing;Walking;House hold activities    Patient Stated Goals  stand up and walk by herself    Currently in Pain?  No/denies                       [facility] Adult PT Treatment/Exercise - 04/06/19 1434      Ambulation/Gait   Ambulation/Gait  Yes    Ambulation Distance (Feet)  226 Feet    Assistive device  Large base quad cane    Gait Pattern  Step-through pattern;Decreased step length - right;Decreased step length - left;Decreased stride length;Poor foot clearance - left      Knee/Hip Exercises: Standing   Other Standing Knee Exercises  stair taps on 12 inch step - 2x10 bilateral    Other Standing Knee Exercises  marching in standing 1x10 bilateral; lateral stepping 2x15 feet       Knee/Hip Exercises: Seated   Sit to Sand  2 sets;with UE support;10 reps               PT  Short Term Goals - 03/30/19 1621      PT SHORT TERM GOAL #1   Title  Pt will be independent with HEP and perform consistently in order to improve functional outcomes    Time  4    Period  Weeks    Status  New    Target Date  04/27/19      PT SHORT TERM GOAL #2   Title  Patient will be able to complete 5x STS in under 14.8 seconds in order demonstrate improved LE strength to reduce the risk of falls.    Baseline  18.69    Time  4    Period  Weeks    Status  New    Target Date  04/27/19        PT Long Term Goals - 03/30/19 1629      PT LONG TERM GOAL #1   Title  Patient will increase score by at least 7 points on Berg balance scale in order to reduce the risk of falls.    Baseline  03/30/19: 33/56    Time  8    Status  New    Target Date  05/25/19      PT LONG TERM GOAL #2   Title  Pt will be able to perform bil tandem stance for greater than 10 seconds without  UE support in order to demo improved functional strength and balance.    Baseline  04/16/19: unable to maintain without UE support    Time  8    Period  Weeks    Status  New    Target Date  05/25/19      PT LONG TERM GOAL #3   Title  Patient will demonstrate ability to perform sit to stand transfer with modified independence without fear of falling to improve overall functional mobility.    Time  8    Period  Weeks    Status  New    Target Date  05/25/19            Plan - 04/06/19 1521    Clinical Impression Statement  Patient ambulates with decreased L foot clearance but is able to correct with verbal cueing. She continues to drag right foot when she is not focusing on gait mechanics. She is very fearful of falling during all standing exercises especially when she does not have UE support and requires frequent reassurance and hold on gait belt to be able to complete. She requires verbal and tactile cuing with sit to stand in order to prevent RLE compensations for LLE weakness. She requires frequent rest breaks secondary to impaired activity tolerance. Patient will continue to benefit from skilled physical therapy in order to improve function and reduce impairment.    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/Moderate complexity    Rehab Potential  Fair    PT Frequency  2x / week    PT Duration  8 weeks    PT Treatment/Interventions  ADLs/Self Care Home Management;Aquatic Therapy;Biofeedback;Electrical Stimulation;Iontophoresis 4mg /ml Dexamethasone;Moist  Heat;Traction;Ultrasound;Parrafin;Fluidtherapy;Contrast Bath;DME Instruction;Gait training;Stair training;Functional mobility training;Therapeutic activities;Therapeutic exercise;Balance training;Neuromuscular re-education;Patient/family education;Orthotic Fit/Training;Manual techniques;Compression bandaging;Passive range of motion;Dry needling;Energy conservation;Splinting;Taping;Spinal Manipulations;Joint Manipulations    PT Next Visit Plan  Continue to progress BLE strengthening in standing  as tolerated. Progress balance and gait activity as able.    PT Home Exercise Plan  04/05/19: seated marching, seated HR/TR, LAQ    Consulted and Agree with Plan of Care  Patient       Patient will benefit from skilled therapeutic intervention in order to improve the following deficits and impairments:  Abnormal gait, Decreased activity tolerance, Decreased balance, Decreased endurance, Decreased mobility, Decreased range of motion, Decreased strength, Difficulty walking, Impaired tone, Improper body mechanics  Visit Diagnosis: Other symptoms and signs involving the nervous system  Other abnormalities of gait and mobility  Muscle weakness (generalized)     Problem List Patient Active Problem List   Diagnosis Date Noted  . 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 Carotid stenosis, bilateral 12/30/2018  . Stroke ([place]) 12/24/2018  . Stroke-like symptoms 12/23/2018  . Generalized weakness 06/09/2018  . Hypothyroidism (acquired) 05/03/2018  . Hypertension 05/03/2018  . Uncontrolled type 2 diabetes mellitus with diabetic nephropathy, with long-term current use of insulin ([place])   . Back pain at L4-L5 level 06/14/2013  . Muscle weakness (generalized) 12/17/2012  . Pain in joint, shoulder region 12/17/2012  . Shoulder fracture 12/14/2012  . Proximal humerus fracture 11/29/2012  . FRACTURE, TOE, RIGHT 09/10/2009    3:55 PM, 04/06/19 Mearl Latin PT, DPT Physical Therapist at  [facility] [place], [place], [address] Phone: [phone]   Fax:  [phone]  Name: VIANKA YLITALO MRN: [id number] Date of Birth: [birth year]

## 2019-04-06 NOTE — Therapy (Signed)
[facility] [place], [place], [address] Phone: [phone]   Fax:  [phone]  Occupational Therapy Treatment  Patient Details  Name: Danielle [name] MRN: [id number] Date of Birth: Jun 08, 1931 Referring Provider (OT): Dr. Sinda Du   Encounter Date: 04/06/2019  OT End of Session - 04/06/19 1522    Visit Number  10    Number of Visits  16    Date for OT Re-Evaluation  04/25/19    Authorization Type  [employer] Medicare Copay $35 No visit limit.    Authorization Time Period  progress note at visit 18    Authorization - Visit Number  10    Authorization - Number of Visits  18    OT Start Time  [id number]    OT Stop Time  1426    OT Time Calculation (min)  38 min    Activity Tolerance  Patient tolerated treatment well    Behavior During Therapy  WFL for tasks assessed/performed       Past Medical History:  Diagnosis Date  . Arthritis    "some joint pain once in awhile" (05/26/2017)  . CVA (cerebral vascular accident) ([place]) 2019  . History of kidney stones   . Hyperlipemia   . Hypertension   . Hypothyroidism (acquired)   . Pneumothorax 02/05/2017   right-after fall  . Rib fractures 02/05/2017   right side  . Stroke ([place])   . Type II diabetes mellitus ([place])     Past Surgical History:  Procedure Laterality Date  . AUGMENTATION MAMMAPLASTY Bilateral   . DILATION AND CURETTAGE OF UTERUS    . FRACTURE SURGERY    . REVERSE SHOULDER ARTHROPLASTY Right 05/26/2017  . REVERSE SHOULDER ARTHROPLASTY Right 05/26/2017   Procedure: REVERSE RIGHT SHOULDER ARTHROPLASTY;  Surgeon: Meredith Pel, MD;  Location: [facility];  Service: Orthopedics;  Laterality: Right;  . TONSILLECTOMY    . TUBAL LIGATION      There were no vitals filed for this visit.  Subjective Assessment - 04/06/19 1410    Subjective   S: I get my Botox on Thursday.    Currently in Pain?  No/denies         [facility] OT Assessment - 04/06/19 1410       Assessment   Medical Diagnosis  Left arm weakness      Precautions   Precautions  Fall    Precaution Comments  left hemiparesis    Required Braces or Orthoses  Other Brace/Splint    Other Brace/Splint  night time resting hand splint. thumb brace for extension during the day                OT Treatments/Exercises (OP) - 04/06/19 1410      Exercises   Exercises  Shoulder      Neurological Re-education Exercises   Forearm Supination  Seated;10 reps;Left;AROM    Forearm Pronation  Seated;10 reps;AAROM    Wrist Flexion  Seated;10 reps;AROM    Wrist Extension  Seated;10 reps;Left;AAROM    Other Exercises 1  Seated; shoulder row; AA/ROM to unweight arm; 10X; left     Other Exercises 2  During on ramp, Patient actively worked on extended her fingers along with the ES during the 8' treatment    Seated with weight on forearm  Seated isometric tricep extension 10X with 2 second hold. Complete with shoulder abduction as well; 10X with 2 second hold.       Modalities  Modalities  Teacher, English as a foreign language Location  Left wrist and finger extensors    Electrical Stimulation Action  Engineer, petroleum Parameters  55 mA CC 8'    Printmaker Goals  Neuromuscular facilitation               OT Short Term Goals - 04/04/19 1503      OT SHORT TERM GOAL #1   Title  Patient and family will be provided and educated on HEP as well as any equipment or adaptive strategies to help aid with increasing the functional use of her LUE during ADL tasks.    Time  4    Period  Weeks    Target Date  03/28/19      OT SHORT TERM GOAL #2   Title  Patient will increase LUE P/ROM to [facility] in order to increase ability to complete dressing tasks.    Time  4    Period  Weeks    Status  On-going      OT SHORT TERM GOAL #3   Title  Patient will increase her left hand grip strength to 10# and her pinch to 5# in order to use  her left hand to stabilize an object such as a cup or plate while eating.    Time  4    Period  Weeks    Status  On-going      OT SHORT TERM GOAL #4   Title  Patient will demonstrate/report the ability to use her LUE as a stabilizer during a basic daily task such as during meals or grooming.    Time  4    Period  Weeks    Status  On-going        OT Long Term Goals - 03/04/19 1657      OT LONG TERM GOAL #1   Title  patient will demonstrate the ability to use her left UE as an active assist during daily tasks.    Time  8    Period  Weeks    Status  On-going      OT LONG TERM GOAL #2   Title  Patient will increase her LUE A/ROM to allow her to complete functional tasks at waist level with less difficulty.    Time  8    Period  Weeks    Status  On-going      OT LONG TERM GOAL #3   Title  Patient will increase her left hand strength to 15# and her pinch strength to 8# in order to hold on to items with greater stability and less dropping.    Time  8    Period  Weeks    Status  On-going      OT LONG TERM GOAL #4   Title  Patient will demonstrate increased coordination by completing the 9 hole peg test with therapist providing set up of pegs.    Time  8    Period  Weeks    Status  On-going      OT LONG TERM GOAL #5   Title  Pt will improve her LUE strength to 3/5 in order to complete low level functional reaching tasks while at home requiring greater independence.    Time  8    Period  Weeks    Status  On-going            Plan - 04/06/19  1523    Clinical Impression Statement  A: Continued with use of NMES for muscle activation of the LUE. Patient with more difficulty to extend fingers of hand versus previous session. Continues to be able to complete AA/ROM of her shoulder including isometric holds although increased tone is preventing her from further movement.    Body Structure / Function / Physical Skills  Balance;ADL;UE functional use;Fascial  restriction;Flexibility;FMC;Proprioception;ROM;Coordination;GMC;Sensation;Decreased knowledge of use of DME;IADL;Strength;Edema;Tone    Plan  P: Hold OT services for 2 weeks due to Botox injection. Assess LUE movement when patient returns. Continue to work on increasing functional ROM of LUE during weightbearing and weightshifting tasks. Continue with use of NMES.    Consulted and Agree with Plan of Care  Patient       Patient will benefit from skilled therapeutic intervention in order to improve the following deficits and impairments:   Body Structure / Function / Physical Skills: Balance, ADL, UE functional use, Fascial restriction, Flexibility, FMC, Proprioception, ROM, Coordination, GMC, Sensation, Decreased knowledge of use of DME, IADL, Strength, Edema, Tone       Visit Diagnosis: Other symptoms and signs involving the musculoskeletal system  Stiffness of left shoulder, not elsewhere classified  Other lack of coordination    Problem List Patient Active Problem List   Diagnosis Date Noted  . 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 Carotid stenosis, bilateral 12/30/2018  . Stroke ([place]) 12/24/2018  . Stroke-like symptoms 12/23/2018  . Generalized weakness 06/09/2018  . Hypothyroidism (acquired) 05/03/2018  . Hypertension 05/03/2018  . Uncontrolled type 2 diabetes mellitus with diabetic nephropathy, with long-term current use of insulin ([place])   . Back pain at L4-L5 level 06/14/2013  . Muscle weakness (generalized) 12/17/2012  . Pain in joint, shoulder region 12/17/2012  . Shoulder fracture 12/14/2012  . Proximal humerus fracture 11/29/2012  . FRACTURE, TOE, RIGHT 09/10/2009   Ailene Ravel, OTR/L,CBIS  [phone]  04/06/2019, 4:03 PM  [facility] [address] [place], [place], [address] Phone: [phone]   Fax:  [phone]  Name: Danielle [name] MRN: [id number] Date of Birth: 04-16-1932

## 2019-04-08 ENCOUNTER — Encounter: Payer: Medicare Other | Admitting: Physical Medicine & Rehabilitation

## 2019-04-08 ENCOUNTER — Encounter: Payer: Self-pay | Admitting: Physical Medicine & Rehabilitation

## 2019-04-08 ENCOUNTER — Other Ambulatory Visit: Payer: Self-pay

## 2019-04-08 VITALS — BP 118/64 | HR 74 | Temp 97.9°F | Ht 65.0 in | Wt 124.0 lb

## 2019-04-08 DIAGNOSIS — I63511 Cerebral infarction due to unspecified occlusion or stenosis of right middle cerebral artery: Secondary | ICD-10-CM | POA: Diagnosis not present

## 2019-04-08 DIAGNOSIS — G8110 Spastic hemiplegia affecting unspecified side: Secondary | ICD-10-CM | POA: Diagnosis not present

## 2019-04-08 NOTE — Patient Instructions (Signed)

## 2019-04-08 NOTE — Progress Notes (Signed)
Botox Injection for spasticity using needle EMG guidance  Dilution: 50 Units/ml Indication: Severe spasticity which interferes with ADL,mobility and/or  hygiene and is unresponsive to medication management and other conservative care Informed consent was obtained after describing risks and benefits of the procedure with the patient. This includes bleeding, bruising, infection, excessive weakness, or medication side effects. A REMS form is on file and signed. Needle: 27g 1" needle electrode Number of units per muscle  Left FDS 50 units Left FDP 25 units Left FCR 50 units Left FPL 25 units Left biceps 50 units All injections were done after obtaining appropriate EMG activity and after negative drawback for blood. The patient tolerated the procedure well. Post procedure instructions were given. A followup appointment was made.

## 2019-04-11 ENCOUNTER — Ambulatory Visit (HOSPITAL_COMMUNITY): Payer: Medicare Other | Admitting: Physical Therapy

## 2019-04-11 ENCOUNTER — Telehealth (HOSPITAL_COMMUNITY): Payer: Self-pay | Admitting: Physical Therapy

## 2019-04-11 ENCOUNTER — Encounter (HOSPITAL_COMMUNITY): Payer: Medicare Other

## 2019-04-11 NOTE — Telephone Encounter (Signed)
pt called to cx today's appt

## 2019-04-13 ENCOUNTER — Encounter (HOSPITAL_COMMUNITY): Payer: Medicare Other

## 2019-04-18 ENCOUNTER — Encounter (HOSPITAL_COMMUNITY): Payer: Medicare Other

## 2019-04-19 ENCOUNTER — Other Ambulatory Visit: Payer: Self-pay

## 2019-04-19 ENCOUNTER — Encounter: Payer: Self-pay | Admitting: Family Medicine

## 2019-04-19 ENCOUNTER — Ambulatory Visit (INDEPENDENT_AMBULATORY_CARE_PROVIDER_SITE_OTHER): Payer: Medicare Other | Admitting: Family Medicine

## 2019-04-19 VITALS — BP 145/75 | HR 79 | Temp 97.4°F | Ht 65.0 in | Wt 137.0 lb

## 2019-04-19 DIAGNOSIS — Z1231 Encounter for screening mammogram for malignant neoplasm of breast: Secondary | ICD-10-CM | POA: Diagnosis not present

## 2019-04-19 DIAGNOSIS — E039 Hypothyroidism, unspecified: Secondary | ICD-10-CM

## 2019-04-19 DIAGNOSIS — M810 Age-related osteoporosis without current pathological fracture: Secondary | ICD-10-CM

## 2019-04-19 DIAGNOSIS — E785 Hyperlipidemia, unspecified: Secondary | ICD-10-CM | POA: Diagnosis not present

## 2019-04-19 DIAGNOSIS — I10 Essential (primary) hypertension: Secondary | ICD-10-CM

## 2019-04-19 HISTORY — DX: Encounter for screening mammogram for malignant neoplasm of breast: Z12.31

## 2019-04-19 NOTE — Patient Instructions (Signed)
Bone density and mammogram recommended

## 2019-04-19 NOTE — Progress Notes (Signed)
New Patient Office Visit  Subjective:  Patient ID: Danielle [name], female    DOB: May 06, 1932  Age: 85 y.o. MRN: [id number]  CC:  Chief Complaint  Patient presents with  . Establish Care  htn/hyperlipidemia/osteoporosis-chronic disease management  HPI Danielle [name] presents for h/o CVA-left sided weakness-botox for contractures Brace on left foot to assist with ambulation /HTN/Hyperlipidemia-stable-medication daily Endo following for DM  Past Medical History:  Diagnosis Date  . Arthritis    "some joint pain once in awhile" (05/26/2017)  . Cataract   . CVA (cerebral vascular accident) ([place]) 2019  . Heart murmur   . History of kidney stones   . Hyperlipemia   . Hypertension   . Hypothyroidism (acquired)   . Osteoporosis   . Pneumothorax 02/05/2017   right-after fall  . Rib fractures 02/05/2017   right side  . Stroke ([place])   . Type II diabetes mellitus ([place])     Past Surgical History:  Procedure Laterality Date  . AUGMENTATION MAMMAPLASTY Bilateral   . COSMETIC SURGERY    . DILATION AND CURETTAGE OF UTERUS    . EYE SURGERY    . FRACTURE SURGERY    . JOINT REPLACEMENT    . REVERSE SHOULDER ARTHROPLASTY Right 05/26/2017  . REVERSE SHOULDER ARTHROPLASTY Right 05/26/2017   Procedure: REVERSE RIGHT SHOULDER ARTHROPLASTY;  Surgeon: Meredith Pel, MD;  Location: [facility];  Service: Orthopedics;  Laterality: Right;  . TONSILLECTOMY    . TUBAL LIGATION      Family History  Problem Relation Age of Onset  . Cancer Mother   . Heart disease Father     Social History   Socioeconomic History  . Marital status: Married    Spouse name: Not on file  . Number of children: Not on file  . Years of education: Not on file  . Highest education level: Not on file  Occupational History  . Occupation: retired  Scientific laboratory technician  . Financial resource strain: Patient refused  . Food insecurity    Worry: Patient refused    Inability: Patient refused  . Transportation  needs    Medical: Patient refused    Non-medical: Patient refused  Tobacco Use  . Smoking status: Former Smoker    Packs/day: 1.00    Years: 40.00    Pack years: 40.00    Types: Cigarettes    Quit date: 1991    Years since quitting: 29.9  . Smokeless tobacco: Never Used  Substance and Sexual Activity  . Alcohol use: No  . Drug use: No  . Sexual activity: Yes  Lifestyle  . Physical activity    Days per week: Patient refused    Minutes per session: Patient refused  . Stress: Patient refused  Relationships  . Social Herbalist on phone: Patient refused    Gets together: Patient refused    Attends religious service: Patient refused    Active member of club or organization: Patient refused    Attends meetings of clubs or organizations: Patient refused    Relationship status: Patient refused  . Intimate partner violence    Fear of current or ex partner: Patient refused    Emotionally abused: Patient refused    Physically abused: Patient refused    Forced sexual activity: Patient refused  Other Topics Concern  . Not on file  Social History Narrative  . Not on file    ROS Review of Systems  Constitutional: Positive for activity change and fatigue.  Eyes:       Diabetic eye exam  Cardiovascular: Positive for leg swelling.  Endocrine: Positive for cold intolerance, polydipsia and polyuria.       Endo in [place] following  Genitourinary: Positive for frequency and urgency.  Musculoskeletal: Positive for back pain and gait problem.  Neurological: Positive for weakness and light-headedness.       H/o CVA-left sided weakness-pmand r with botox  Hematological: Bruises/bleeds easily.  Psychiatric/Behavioral: Positive for agitation, confusion, decreased concentration and sleep disturbance. The patient is nervous/anxious.     Objective:   Today's Vitals: BP (!) 145/75 (BP Location: Right Arm, Patient Position: Sitting, Cuff Size: Normal)   Pulse 79   Temp (!) 97.4 F  (36.3 C) (Oral)   Ht 5\' 5"  (1.651 m)   Wt 137 lb (62.1 kg)   SpO2 97%   BMI 22.80 kg/m   Physical Exam Vitals signs reviewed.  HENT:     Head: Normocephalic and atraumatic.  Cardiovascular:     Rate and Rhythm: Normal rate and regular rhythm.     Pulses: Normal pulses.     Heart sounds: Normal heart sounds.  Pulmonary:     Effort: Pulmonary effort is normal.     Breath sounds: Normal breath sounds.  Musculoskeletal:     Comments: LROM left arm/hand  Neurological:     Mental Status: She is alert.     Gait: Gait abnormal.  Psychiatric:        Mood and Affect: Mood normal.        Behavior: Behavior normal.     Assessment & Plan:    Outpatient Encounter Medications as of 04/19/2019  Medication Sig  . acetaminophen (TYLENOL) 325 MG tablet Take 2 tablets (650 mg total) by mouth every 6 (six) hours as needed for mild pain (or Fever >/= 101).  Marland Kitchen amLODipine (NORVASC) 5 MG tablet Take 1 tablet (5 mg total) by mouth daily.  Marland Kitchen aspirin 325 MG tablet Take 1 tablet (325 mg total) by mouth daily.  Marland Kitchen atorvastatin (LIPITOR) 80 MG tablet Take 1 tablet (80 mg total) by mouth daily at 6 PM.  . Calcium Carb-Cholecalciferol (CALCIUM 600 + D PO) Take 1 tablet by mouth daily.  . Calcium Carbonate-Vitamin D (CALCIUM-VITAMIN D3 PO) Take 600 mg by mouth.  . clopidogrel (PLAVIX) 75 MG tablet Take 75 mg by mouth daily.  . diclofenac sodium (VOLTAREN) 1 % GEL Apply 2 g topically 4 (four) times daily.  . Insulin Glargine, 2 Unit Dial, (TOUJEO MAX SOLOSTAR) 300 UNIT/ML SOPN Inject 30 Units into the skin daily before breakfast.   . Insulin Lispro (HUMALOG KWIKPEN) 200 UNIT/ML SOPN Inject into the skin. 10-12 units BID  . Multiple Vitamins-Minerals (OCUVITE ADULT 50+ PO) Take by mouth.  . multivitamin (PROSIGHT) TABS tablet Take 1 tablet by mouth daily.  Marland Kitchen senna-docusate (SENOKOT-S) 8.6-50 MG tablet Take 2 tablets by mouth at bedtime. (Patient taking differently: Take 2 tablets by mouth at bedtime as  needed. )  . SYNTHROID 100 MCG tablet Take 1 tablet (100 mcg total) by mouth every morning.  . vitamin B-12 (CYANOCOBALAMIN) 1000 MCG tablet Take 1 tablet (1,000 mcg total) by mouth daily.   No facility-administered encounter medications on file as of 04/19/2019.    1. Osteoporosis, unspecified osteoporosis type, unspecified pathological fracture presence calcium - DG Bone Density; Future  2. Encounter for screening mammogram for malignant neoplasm of breast - MM Digital Screening; Future  3. Essential hypertension amlodipine  4. Hyperlipidemia, unspecified hyperlipidemia  type Atorvastatin-ldl 12/2018 normal Follow-up:   Verlia Kaney Hannah Beat, MD

## 2019-04-20 ENCOUNTER — Encounter (HOSPITAL_COMMUNITY): Payer: Medicare Other

## 2019-04-20 ENCOUNTER — Ambulatory Visit (HOSPITAL_COMMUNITY): Payer: Medicare Other | Attending: Pulmonary Disease | Admitting: Physical Therapy

## 2019-04-20 ENCOUNTER — Encounter (HOSPITAL_COMMUNITY): Payer: Self-pay | Admitting: Physical Therapy

## 2019-04-20 DIAGNOSIS — M6281 Muscle weakness (generalized): Secondary | ICD-10-CM | POA: Diagnosis present

## 2019-04-20 DIAGNOSIS — M25612 Stiffness of left shoulder, not elsewhere classified: Secondary | ICD-10-CM | POA: Insufficient documentation

## 2019-04-20 DIAGNOSIS — R29818 Other symptoms and signs involving the nervous system: Secondary | ICD-10-CM | POA: Insufficient documentation

## 2019-04-20 DIAGNOSIS — R29898 Other symptoms and signs involving the musculoskeletal system: Secondary | ICD-10-CM | POA: Insufficient documentation

## 2019-04-20 DIAGNOSIS — R278 Other lack of coordination: Secondary | ICD-10-CM | POA: Diagnosis present

## 2019-04-20 DIAGNOSIS — R2689 Other abnormalities of gait and mobility: Secondary | ICD-10-CM | POA: Diagnosis present

## 2019-04-20 NOTE — Therapy (Signed)
[facility] [place], [place], [address] Phone: [phone]   Fax:  [phone]  Physical Therapy Treatment  Patient Details  Name: Danielle [name] MRN: [id number] Date of Birth: 1931-12-13 Referring Provider (PT): Sinda Du   Encounter Date: 04/20/2019  PT End of Session - 04/20/19 1341    Visit Number  4    Number of Visits  16    Date for PT Re-Evaluation  05/25/19   mini reassess:04/27/19   Authorization Type  [employer] Medicare - based on medical necessity    Authorization Time Period  complete progress note at visit 10; 03/30/19-05/24/2018    Authorization - Visit Number  4    Authorization - Number of Visits  10    PT Start Time  [id number]    PT Stop Time  1344    PT Time Calculation (min)  38 min    Equipment Utilized During Treatment  Gait belt;Other (comment)   Quad cane   Activity Tolerance  Patient tolerated treatment well;Patient limited by fatigue    Behavior During Therapy  [facility] for tasks assessed/performed       Past Medical History:  Diagnosis Date  . Arthritis    "some joint pain once in awhile" (05/26/2017)  . Cataract   . CVA (cerebral vascular accident) ([place]) 2019  . Heart murmur   . History of kidney stones   . Hyperlipemia   . Hypertension   . Hypothyroidism (acquired)   . Osteoporosis   . Pneumothorax 02/05/2017   right-after fall  . Rib fractures 02/05/2017   right side  . Stroke ([place])   . Type II diabetes mellitus ([place])     Past Surgical History:  Procedure Laterality Date  . AUGMENTATION MAMMAPLASTY Bilateral   . COSMETIC SURGERY    . DILATION AND CURETTAGE OF UTERUS    . EYE SURGERY    . FRACTURE SURGERY    . JOINT REPLACEMENT    . REVERSE SHOULDER ARTHROPLASTY Right 05/26/2017  . REVERSE SHOULDER ARTHROPLASTY Right 05/26/2017   Procedure: REVERSE RIGHT SHOULDER ARTHROPLASTY;  Surgeon: Meredith Pel, MD;  Location: [facility];  Service: Orthopedics;  Laterality: Right;  .  TONSILLECTOMY    . TUBAL LIGATION      There were no vitals filed for this visit.  Subjective Assessment - 04/20/19 1310    Subjective  Patient reported that she does not get out and walk anymore. She stated she has been doing HEP at home.    Pertinent History  CVA, diabetes    Limitations  Standing;Walking;House hold activities    Patient Stated Goals  stand up and walk by herself    Currently in Pain?  No/denies                       [facility] Adult PT Treatment/Exercise - 04/20/19 0001      Knee/Hip Exercises: Standing   Lateral Step Up  Right;Left;1 set;10 reps;Step Height: 4";Hand Hold: 2    Forward Step Up  Hand Hold: 1;Step Height: 4";1 set;10 reps;Right;Left    Other Standing Knee Exercises  Hip extension x 10 each LE knee straight 1 UE assist for balance    Other Standing Knee Exercises  marching in standing 1x10 bilateral; lateral stepping 2x15 feet       Knee/Hip Exercises: Seated   Long Arc Quad  Both;15 reps    Other Seated Knee/Hip Exercises  Seated hip abduction x 2 with  band, noted decreased movement on left, removed band. 2 x10    Other Seated Knee/Hip Exercises  Seated marching over 6'' hurdle x 10 each LE    Sit to Sand  2 sets;10 reps;without UE support               PT Short Term Goals - 03/30/19 1621      PT SHORT TERM GOAL #1   Title  Pt will be independent with HEP and perform consistently in order to improve functional outcomes    Time  4    Period  Weeks    Status  New    Target Date  04/27/19      PT SHORT TERM GOAL #2   Title  Patient will be able to complete 5x STS in under 14.8 seconds in order demonstrate improved LE strength to reduce the risk of falls.    Baseline  18.69    Time  4    Period  Weeks    Status  New    Target Date  04/27/19        PT Long Term Goals - 03/30/19 1629      PT LONG TERM GOAL #1   Title  Patient will increase score by at least 7 points on Berg balance scale in order to reduce the risk  of falls.    Baseline  03/30/19: 33/56    Time  8    Status  New    Target Date  05/25/19      PT LONG TERM GOAL #2   Title  Pt will be able to perform bil tandem stance for greater than 10 seconds without UE support in order to demo improved functional strength and balance.    Baseline  04/16/19: unable to maintain without UE support    Time  8    Period  Weeks    Status  New    Target Date  05/25/19      PT LONG TERM GOAL #3   Title  Patient will demonstrate ability to perform sit to stand transfer with modified independence without fear of falling to improve overall functional mobility.    Time  8    Period  Weeks    Status  New    Target Date  05/25/19            Plan - 04/20/19 1541    Clinical Impression Statement  Patient progressed to step ups this session. Noted compensations with exercises requiring hip abductor activation so worked on several exercises to isolate hip abductors. Attempted red theraband with seated hip abductor strengthening and patient was unable to perform due to left lower extremity weakness. Patient would benefit from continued skilled physical therapy in order to continue progressing towards functional goals.    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/Moderate complexity    Rehab Potential  Fair    PT Frequency  2x / week    PT Duration  8 weeks    PT Treatment/Interventions  ADLs/Self Care Home Management;Aquatic Therapy;Biofeedback;Electrical Stimulation;Iontophoresis 4mg /ml Dexamethasone;Moist Heat;Traction;Ultrasound;Parrafin;Fluidtherapy;Contrast Bath;DME  Instruction;Gait training;Stair training;Functional mobility training;Therapeutic activities;Therapeutic exercise;Balance training;Neuromuscular re-education;Patient/family education;Orthotic Fit/Training;Manual techniques;Compression bandaging;Passive range of motion;Dry needling;Energy conservation;Splinting;Taping;Spinal Manipulations;Joint Manipulations    PT Next Visit Plan  Continue to progress BLE strengthening in standing as tolerated. Progress balance and gait activity as able.    PT Home Exercise Plan  04/05/19: seated marching, seated HR/TR, LAQ    Consulted and Agree with Plan of Care  Patient       Patient will benefit from skilled therapeutic intervention in order to improve the following deficits and impairments:  Abnormal gait, Decreased activity tolerance, Decreased balance, Decreased endurance, Decreased mobility, Decreased range of motion, Decreased strength, Difficulty walking, Impaired tone, Improper body mechanics  Visit Diagnosis: Other symptoms and signs involving the nervous system  Other abnormalities of gait and mobility  Muscle weakness (generalized)     Problem List Patient Active Problem List   Diagnosis Date Noted  . Osteoporosis 04/19/2019  . Encounter for screening mammogram for malignant neoplasm of breast 04/19/2019  . Hyperlipidemia 04/19/2019  . 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 Carotid stenosis, bilateral 12/30/2018  . Stroke ([place]) 12/24/2018  . Stroke-like symptoms 12/23/2018  . Generalized weakness 06/09/2018  . Hypothyroidism (acquired) 05/03/2018  . Hypertension 05/03/2018  . Uncontrolled type 2 diabetes mellitus with diabetic nephropathy, with long-term current use of insulin ([place])   . Back pain at L4-L5 level 06/14/2013  . Muscle weakness (generalized) 12/17/2012  . Pain in joint, shoulder region 12/17/2012  . Shoulder fracture 12/14/2012  . Proximal humerus fracture 11/29/2012  . FRACTURE, TOE, RIGHT 09/10/2009    Clarene Critchley PT, DPT 3:43 PM, 04/20/19 [facility] [address] [place], [place], [address] Phone: [phone]   Fax:  [phone]  Name: Danielle [name] MRN: [id number] Date of Birth: March 30, 1932

## 2019-04-25 ENCOUNTER — Other Ambulatory Visit: Payer: Self-pay

## 2019-04-25 ENCOUNTER — Ambulatory Visit (HOSPITAL_COMMUNITY): Payer: Medicare Other

## 2019-04-25 ENCOUNTER — Encounter (HOSPITAL_COMMUNITY): Payer: Self-pay

## 2019-04-25 ENCOUNTER — Ambulatory Visit (HOSPITAL_COMMUNITY): Payer: Medicare Other | Admitting: Physical Therapy

## 2019-04-25 ENCOUNTER — Encounter (HOSPITAL_COMMUNITY): Payer: Self-pay | Admitting: Physical Therapy

## 2019-04-25 DIAGNOSIS — R29818 Other symptoms and signs involving the nervous system: Secondary | ICD-10-CM

## 2019-04-25 DIAGNOSIS — M6281 Muscle weakness (generalized): Secondary | ICD-10-CM

## 2019-04-25 DIAGNOSIS — R278 Other lack of coordination: Secondary | ICD-10-CM

## 2019-04-25 DIAGNOSIS — R29898 Other symptoms and signs involving the musculoskeletal system: Secondary | ICD-10-CM

## 2019-04-25 DIAGNOSIS — R2689 Other abnormalities of gait and mobility: Secondary | ICD-10-CM

## 2019-04-25 DIAGNOSIS — M25612 Stiffness of left shoulder, not elsewhere classified: Secondary | ICD-10-CM

## 2019-04-25 NOTE — Therapy (Signed)
[place], [place], [address] Phone: [phone]   Fax:  [phone]  Occupational Therapy Treatment  Patient Details  Name: Danielle [name] MRN: [id number] Date of Birth: 05/04/1932 Referring Provider (OT): Benny Lennert, MD (Dr. Sinda Du (retired))   Progress Note Reporting Period 03/30/2019 to 04/25/2019  See note below for Objective Data and Assessment of Progress/Goals.       Encounter Date: 04/25/2019  OT End of Session - 04/25/19 1908    Visit Number  11    Number of Visits  23    Date for OT Re-Evaluation  06/06/19    Authorization Type  [employer] Medicare Copay $35 No visit limit.    Authorization Time Period  progress note at visit 21    Authorization - Visit Number  11    Authorization - Number of Visits  21    OT Start Time  1346    OT Stop Time  1429    OT Time Calculation (min)  43 min    Activity Tolerance  Patient tolerated treatment well    Behavior During Therapy  WFL for tasks assessed/performed       Past Medical History:  Diagnosis Date  . Arthritis    "some joint pain once in awhile" (05/26/2017)  . Cataract   . CVA (cerebral vascular accident) ([place]) 2019  . Heart murmur   . History of kidney stones   . Hyperlipemia   . Hypertension   . Hypothyroidism (acquired)   . Osteoporosis   . Pneumothorax 02/05/2017   right-after fall  . Rib fractures 02/05/2017   right side  . Stroke ([place])   . Type II diabetes mellitus ([place])     Past Surgical History:  Procedure Laterality Date  . AUGMENTATION MAMMAPLASTY Bilateral   . COSMETIC SURGERY    . DILATION AND CURETTAGE OF UTERUS    . EYE SURGERY    . FRACTURE SURGERY    . JOINT REPLACEMENT    . REVERSE SHOULDER ARTHROPLASTY Right 05/26/2017  . REVERSE SHOULDER ARTHROPLASTY Right 05/26/2017   Procedure: REVERSE RIGHT SHOULDER ARTHROPLASTY;  Surgeon: Meredith Pel, MD;  Location: [facility];  Service: Orthopedics;  Laterality:  Right;  . TONSILLECTOMY    . TUBAL LIGATION      There were no vitals filed for this visit.  Subjective Assessment - 04/25/19 1351    Subjective   S: It was either Saturday or Sunday when I laid on it; kinda like a fall. And it feels bruised. (discussing LUE/wrist)    Currently in Pain?  Yes    Pain Score  5     Pain Location  Wrist   fingers   Pain Orientation  Left    Pain Descriptors / Indicators  Sore    Pain Type  Acute pain    Pain Radiating Towards  N/A    Pain Onset  In the past 7 days    Pain Frequency  Occasional    Aggravating Factors   any touch    Pain Relieving Factors  rest and immobilization    Effect of Pain on Daily Activities  Patient is unable to utilize her LUE for any daily tasks at this time.    Multiple Pain Sites  No         [facility] OT Assessment - 04/25/19 1356      Assessment   Medical Diagnosis  Left arm weakness    Referring Provider (OT)  Benny Lennert, MD   Dr. Sinda Du (retired)     Precautions   Precautions  Fall    Precaution Comments  left hemiparesis    Required Braces or Orthoses  Other Brace/Splint    Other Brace/Splint  night time resting hand splint. thumb brace for extension during the day       Coordination   Gross Motor Movements are Fluid and Coordinated  No    Fine Motor Movements are Fluid and Coordinated  No    9 Hole Peg Test  --   unable to complete   Box and Blocks  unable to test left hand.     Tremors  less tremors noted this date      Edema   Edema  left wrist:15.5 cm right wrist: 13.0 cm Left/Right MCP joint: 17.5 cm      ROM / Strength   AROM / PROM / Strength  AROM;PROM;Strength      AROM   Overall AROM Comments  Assessed seated.     Right/Left Elbow  Left    Left Elbow Flexion  90   previous: 30 (full A/ROM elbow flexion supine)   Left Elbow Extension  -45   previous: unable to measure   Right/Left Forearm  Left    Left Forearm Pronation  --   unable   Left Forearm Supination  74 Degrees    previous: same   Left Wrist Extension  10 Degrees   previous: 0   Left Wrist Flexion  30 Degrees   previous: same     PROM   Overall PROM Comments  Assessed supine. IR/er adducted    PROM Assessment Site  Shoulder    Right/Left Shoulder  Left    Left Shoulder Flexion  135 Degrees   previous: 132   Left Shoulder ABduction  180 Degrees   previous: 125   Left Shoulder Internal Rotation  90 Degrees   previous: same   Left Shoulder External Rotation  80 Degrees   previous: 50     Strength   Left Hand Lateral Pinch  2 lbs   previous: 4                      OT Education - 04/25/19 1907    Education Details  Discussed reassessment findings, encouraged that she have her left wrist assessed by the MD due to increased pain and swelling. Reviewed goals. Recommended patient continue OT services for 6 more weeks.    Person(s) Educated  Patient;Spouse    Methods  Explanation    Comprehension  Verbalized understanding       OT Short Term Goals - 04/04/19 1503      OT SHORT TERM GOAL #1   Title  Patient and family will be provided and educated on HEP as well as any equipment or adaptive strategies to help aid with increasing the functional use of her LUE during ADL tasks.    Time  4    Period  Weeks    Target Date  03/28/19      OT SHORT TERM GOAL #2   Title  Patient will increase LUE P/ROM to [facility] in order to increase ability to complete dressing tasks.    Time  4    Period  Weeks    Status  On-going      OT SHORT TERM GOAL #3   Title  Patient will increase her left hand grip strength to 10# and her  pinch to 5# in order to use her left hand to stabilize an object such as a cup or plate while eating.    Time  4    Period  Weeks    Status  On-going      OT SHORT TERM GOAL #4   Title  Patient will demonstrate/report the ability to use her LUE as a stabilizer during a basic daily task such as during meals or grooming.    Time  4    Period  Weeks    Status   On-going        OT Long Term Goals - 03/04/19 1657      OT LONG TERM GOAL #1   Title  patient will demonstrate the ability to use her left UE as an active assist during daily tasks.    Time  8    Period  Weeks    Status  On-going      OT LONG TERM GOAL #2   Title  Patient will increase her LUE A/ROM to allow her to complete functional tasks at waist level with less difficulty.    Time  8    Period  Weeks    Status  On-going      OT LONG TERM GOAL #3   Title  Patient will increase her left hand strength to 15# and her pinch strength to 8# in order to hold on to items with greater stability and less dropping.    Time  8    Period  Weeks    Status  On-going      OT LONG TERM GOAL #4   Title  Patient will demonstrate increased coordination by completing the 9 hole peg test with therapist providing set up of pegs.    Time  8    Period  Weeks    Status  On-going      OT LONG TERM GOAL #5   Title  Pt will improve her LUE strength to 3/5 in order to complete low level functional reaching tasks while at home requiring greater independence.    Time  8    Period  Weeks    Status  On-going            Plan - 04/25/19 1909    Clinical Impression Statement  A: reassessment completed this date. Patient received Botox injections to her left UE 14 days ago. Injections completed to bicep and forearm. Since injections, patient is demonstrated decreased flexor synergy. Less tone is noted in her fingers, elbow, and shoulder. Although no additional goals have been made, she is able to tolerate greater passive ROM. Pt reports a recent "fall" this weekend in which she felt off balance and her husband laid her down as she felt weak. Pt reports that she laid on her LUE and rolled onto her arm in which she is experiencing increased pain in the wrist with limited tolorance for ROM and visable bruising. Discussed concerns with husband and patient regarding wrist.    Body Structure / Function /  Physical Skills  Balance;ADL;UE functional use;Fascial restriction;Flexibility;FMC;Proprioception;ROM;Coordination;GMC;Sensation;Decreased knowledge of use of DME;IADL;Strength;Edema;Tone    Plan  P: Staff message sent to Dr. Letta Pate regarding OT services during Botox injections. Waiting to hear back. Continue OT services 2 x a week for 6 weeks focusing on mentioned deficits. Next session: Continue working on increasing functional ROM of LUE during weightbearing and weightshifting tasks. Continue with use of NMES. Further monitor left wrist swelling and bruising.  Consulted and Agree with Plan of Care  Patient;Family member/caregiver    Family Member Consulted  Husband: [name]       Patient will benefit from skilled therapeutic intervention in order to improve the following deficits and impairments:   Body Structure / Function / Physical Skills: Balance, ADL, UE functional use, Fascial restriction, Flexibility, FMC, Proprioception, ROM, Coordination, GMC, Sensation, Decreased knowledge of use of DME, IADL, Strength, Edema, Tone       Visit Diagnosis: Other symptoms and signs involving the musculoskeletal system - Plan: Ot plan of care cert/re-cert  Stiffness of left shoulder, not elsewhere classified - Plan: Ot plan of care cert/re-cert  Other lack of coordination - Plan: Ot plan of care cert/re-cert    Problem List Patient Active Problem List   Diagnosis Date Noted  . Osteoporosis 04/19/2019  . Encounter for screening mammogram for malignant neoplasm of breast 04/19/2019  . Hyperlipidemia 04/19/2019  . 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 Carotid stenosis, bilateral 12/30/2018  . Stroke ([place]) 12/24/2018  . Stroke-like symptoms 12/23/2018  . Generalized weakness 06/09/2018  . Hypothyroidism (acquired) 05/03/2018  . Hypertension 05/03/2018  . Uncontrolled type 2 diabetes mellitus with diabetic nephropathy, with long-term current use of insulin ([place])   .  Back pain at L4-L5 level 06/14/2013  . Muscle weakness (generalized) 12/17/2012  . Pain in joint, shoulder region 12/17/2012  . Shoulder fracture 12/14/2012  . Proximal humerus fracture 11/29/2012  . FRACTURE, TOE, RIGHT 09/10/2009    Ailene Ravel, OTR/L,CBIS  [phone]  04/25/2019, 7:21 PM  [facility] [address] [place], [place], [address] Phone: [phone]   Fax:  [phone]  Name: Danielle [name] MRN: [id number] Date of Birth: 01/22/1932

## 2019-04-25 NOTE — Therapy (Signed)
[facility] [place], [place], [address] Phone: [phone]   Fax:  [phone]  Physical Therapy Treatment  Patient Details  Name: Danielle [name] MRN: [id number] Date of Birth: December 23, 1931 Referring Provider (PT): Sinda Du   Encounter Date: 04/25/2019  PT End of Session - 04/25/19 1309    Visit Number  5    Number of Visits  16    Date for PT Re-Evaluation  05/25/19   mini reassess:04/27/19   Authorization Type  [employer] Medicare - based on medical necessity    Authorization Time Period  complete progress note at visit 10; 03/30/19-05/24/2018    Authorization - Visit Number  5    Authorization - Number of Visits  10    PT Start Time  1302    PT Stop Time  1340    PT Time Calculation (min)  38 min    Equipment Utilized During Treatment  Gait belt;Other (comment)   Quad cane   Activity Tolerance  Patient tolerated treatment well;Patient limited by fatigue    Behavior During Therapy  [facility] for tasks assessed/performed       Past Medical History:  Diagnosis Date  . Arthritis    "some joint pain once in awhile" (05/26/2017)  . Cataract   . CVA (cerebral vascular accident) ([place]) 2019  . Heart murmur   . History of kidney stones   . Hyperlipemia   . Hypertension   . Hypothyroidism (acquired)   . Osteoporosis   . Pneumothorax 02/05/2017   right-after fall  . Rib fractures 02/05/2017   right side  . Stroke ([place])   . Type II diabetes mellitus ([place])     Past Surgical History:  Procedure Laterality Date  . AUGMENTATION MAMMAPLASTY Bilateral   . COSMETIC SURGERY    . DILATION AND CURETTAGE OF UTERUS    . EYE SURGERY    . FRACTURE SURGERY    . JOINT REPLACEMENT    . REVERSE SHOULDER ARTHROPLASTY Right 05/26/2017  . REVERSE SHOULDER ARTHROPLASTY Right 05/26/2017   Procedure: REVERSE RIGHT SHOULDER ARTHROPLASTY;  Surgeon: Meredith Pel, MD;  Location: [facility];  Service: Orthopedics;  Laterality: Right;  .  TONSILLECTOMY    . TUBAL LIGATION      There were no vitals filed for this visit.  Subjective Assessment - 04/25/19 1300    Subjective  Patient says she felt like she was going to fall out of the bed over the weekend, so she laid down on her LT arm. Patient says this made her LT arm and wrist sore and has not been able to use it as much since. Patient says she had her husband help her up form the floor.  Patient says she has been working on home exercise with the help of her daughter, but states she is still "afraid to cut loose and walk" without use of assistive device or assistance from family. Patient says her wrist is sore but denies "actual pain".    Pertinent History  CVA, diabetes    Limitations  Standing;Walking;House hold activities    Patient Stated Goals  stand up and walk by herself    Currently in Pain?  No/denies                       [facility] Adult PT Treatment/Exercise - 04/25/19 0001      Knee/Hip Exercises: Standing   Heel Raises  Both;20 reps    Lateral Step  Up  Right;Left;1 set;10 reps;Step Height: 4";Hand Hold: 2    Forward Step Up  Hand Hold: 1;Step Height: 4";1 set;10 reps;Right;Left    Other Standing Knee Exercises  marching in standing 1x10 bilateral;      Knee/Hip Exercises: Seated   Long Arc Quad  Both;2 sets;10 reps   2nd set with 1# ankle weights   Other Seated Knee/Hip Exercises  Seated marching over 6'' hurdle x 10 each LE    Marching  Both;20 reps   1# ankle weights    Sit to Sand  2 sets;10 reps;without UE support          Balance Exercises - 04/25/19 1330      Balance Exercises: Standing   Tandem Stance  Eyes open;2 reps;30 secs;Intermittent upper extremity support    Sidestepping  2 reps   15" 2 x RT         PT Short Term Goals - 03/30/19 1621      PT SHORT TERM GOAL #1   Title  Pt will be independent with HEP and perform consistently in order to improve functional outcomes    Time  4    Period  Weeks    Status  New     Target Date  04/27/19      PT SHORT TERM GOAL #2   Title  Patient will be able to complete 5x STS in under 14.8 seconds in order demonstrate improved LE strength to reduce the risk of falls.    Baseline  18.69    Time  4    Period  Weeks    Status  New    Target Date  04/27/19        PT Long Term Goals - 03/30/19 1629      PT LONG TERM GOAL #1   Title  Patient will increase score by at least 7 points on Berg balance scale in order to reduce the risk of falls.    Baseline  03/30/19: 33/56    Time  8    Status  New    Target Date  05/25/19      PT LONG TERM GOAL #2   Title  Pt will be able to perform bil tandem stance for greater than 10 seconds without UE support in order to demo improved functional strength and balance.    Baseline  04/16/19: unable to maintain without UE support    Time  8    Period  Weeks    Status  New    Target Date  05/25/19      PT LONG TERM GOAL #3   Title  Patient will demonstrate ability to perform sit to stand transfer with modified independence without fear of falling to improve overall functional mobility.    Time  8    Period  Weeks    Status  New    Target Date  05/25/19            Plan - 04/25/19 1345    Clinical Impression Statement  Patient tolerated session well today, but continues to be limited by fatigue with prolonged standing activity. Patient required rest breaks x 3 due to fatigue with activity. Patient continues to have difficulty with static and dynamic balance activity. Patient appears to have decreased proprioception of LT LE especially with sidestepping to LT. Patient required therapist assist for LOB x 1 during sidestepping. Patient requires verbal cueing for controlled lowering into chair during sit to stands for improved target  muscle activation.    Personal Factors and Comorbidities  Age;Comorbidity 2;Past/Current Experience;Time since onset of injury/illness/exacerbation    Comorbidities  diabetes, history of CVA     Examination-Activity Limitations  Bathing;Caring for Others;Carry;Locomotion Level;Squat;Stairs;Stand;Transfers    Examination-Participation Restrictions  Community Activity;Laundry;Shop;Volunteer;Yard Work    [employer]  Evolving/Moderate complexity    Rehab Potential  Fair    PT Frequency  2x / week    PT Duration  8 weeks    PT Treatment/Interventions  ADLs/Self Care Home Management;Aquatic Therapy;Biofeedback;Electrical Stimulation;Iontophoresis 4mg /ml Dexamethasone;Moist Heat;Traction;Ultrasound;Parrafin;Fluidtherapy;Contrast Bath;DME Instruction;Gait training;Stair training;Functional mobility training;Therapeutic activities;Therapeutic exercise;Balance training;Neuromuscular re-education;Patient/family education;Orthotic Fit/Training;Manual techniques;Compression bandaging;Passive range of motion;Dry needling;Energy conservation;Splinting;Taping;Spinal Manipulations;Joint Manipulations    PT Next Visit Plan  Continue to progress BLE strenght and balance as tolerated. Add step over hurdles next visit.    PT Home Exercise Plan  04/05/19: seated marching, seated HR/TR, LAQ    Consulted and Agree with Plan of Care  Patient       Patient will benefit from skilled therapeutic intervention in order to improve the following deficits and impairments:  Abnormal gait, Decreased activity tolerance, Decreased balance, Decreased endurance, Decreased mobility, Decreased range of motion, Decreased strength, Difficulty walking, Impaired tone, Improper body mechanics  Visit Diagnosis: Other symptoms and signs involving the nervous system  Other abnormalities of gait and mobility  Muscle weakness (generalized)     Problem List Patient Active Problem List   Diagnosis Date Noted  . Osteoporosis 04/19/2019  . Encounter for screening mammogram for malignant neoplasm of breast 04/19/2019  . Hyperlipidemia 04/19/2019  . 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  Carotid stenosis, bilateral 12/30/2018  . Stroke ([place]) 12/24/2018  . Stroke-like symptoms 12/23/2018  . Generalized weakness 06/09/2018  . Hypothyroidism (acquired) 05/03/2018  . Hypertension 05/03/2018  . Uncontrolled type 2 diabetes mellitus with diabetic nephropathy, with long-term current use of insulin ([place])   . Back pain at L4-L5 level 06/14/2013  . Muscle weakness (generalized) 12/17/2012  . Pain in joint, shoulder region 12/17/2012  . Shoulder fracture 12/14/2012  . Proximal humerus fracture 11/29/2012  . FRACTURE, TOE, RIGHT 09/10/2009   1:48 PM, 04/25/19 Josue Hector PT DPT  Physical Therapist with [facility]  [phone] [place] [address] [place], [place], [address] Phone: [phone]   Fax:  [phone]  Name: Danielle [name] MRN: [id number] Date of Birth: 1931/05/27

## 2019-04-27 ENCOUNTER — Ambulatory Visit (HOSPITAL_COMMUNITY): Payer: Medicare Other

## 2019-04-27 ENCOUNTER — Other Ambulatory Visit: Payer: Self-pay

## 2019-04-27 ENCOUNTER — Encounter (HOSPITAL_COMMUNITY): Payer: Self-pay

## 2019-04-27 ENCOUNTER — Ambulatory Visit (HOSPITAL_COMMUNITY): Payer: Medicare Other | Admitting: Physical Therapy

## 2019-04-27 ENCOUNTER — Encounter (HOSPITAL_COMMUNITY): Payer: Self-pay | Admitting: Physical Therapy

## 2019-04-27 DIAGNOSIS — M6281 Muscle weakness (generalized): Secondary | ICD-10-CM

## 2019-04-27 DIAGNOSIS — R29898 Other symptoms and signs involving the musculoskeletal system: Secondary | ICD-10-CM

## 2019-04-27 DIAGNOSIS — R29818 Other symptoms and signs involving the nervous system: Secondary | ICD-10-CM | POA: Diagnosis not present

## 2019-04-27 DIAGNOSIS — R2689 Other abnormalities of gait and mobility: Secondary | ICD-10-CM

## 2019-04-27 DIAGNOSIS — R278 Other lack of coordination: Secondary | ICD-10-CM

## 2019-04-27 DIAGNOSIS — M25612 Stiffness of left shoulder, not elsewhere classified: Secondary | ICD-10-CM

## 2019-04-27 NOTE — Therapy (Signed)
[facility] [address] [place], [place], [address] Phone: [phone]   Fax:  [phone]  Physical Therapy Treatment / Progress Note  Patient Details  Name: Danielle [name] MRN: [id number] Date of Birth: November 03, 1931 Referring Provider (PT): Sinda Du   Encounter Date: 04/27/2019   Progress Note Reporting Period 03/30/19 to 04/27/19  See note below for Objective Data and Assessment of Progress/Goals.       PT End of Session - 04/27/19 1421    Visit Number  6    Number of Visits  16    Date for PT Re-Evaluation  05/25/19   mini reassess:04/27/19   Authorization Type  [employer] Medicare - based on medical necessity    Authorization Time Period  complete progress note at visit 10; 03/30/19-05/24/2018    Authorization - Visit Number  1    Authorization - Number of Visits  10    PT Start Time  1430    PT Stop Time  1510    PT Time Calculation (min)  40 min    Equipment Utilized During Treatment  Gait belt;Other (comment)   Quad cane   Activity Tolerance  Patient tolerated treatment well;Patient limited by fatigue    Behavior During Therapy  [facility] for tasks assessed/performed       Past Medical History:  Diagnosis Date  . Arthritis    "some joint pain once in awhile" (05/26/2017)  . Cataract   . CVA (cerebral vascular accident) ([place]) 2019  . Heart murmur   . History of kidney stones   . Hyperlipemia   . Hypertension   . Hypothyroidism (acquired)   . Osteoporosis   . Pneumothorax 02/05/2017   right-after fall  . Rib fractures 02/05/2017   right side  . Stroke ([place])   . Type II diabetes mellitus ([place])     Past Surgical History:  Procedure Laterality Date  . AUGMENTATION MAMMAPLASTY Bilateral   . COSMETIC SURGERY    . DILATION AND CURETTAGE OF UTERUS    . EYE SURGERY    . FRACTURE SURGERY    . JOINT REPLACEMENT    . REVERSE SHOULDER ARTHROPLASTY Right 05/26/2017  . REVERSE SHOULDER ARTHROPLASTY Right 05/26/2017   Procedure: REVERSE RIGHT SHOULDER ARTHROPLASTY;  Surgeon: Meredith Pel, MD;  Location: [facility];  Service: Orthopedics;  Laterality: Right;  . TONSILLECTOMY    . TUBAL LIGATION      There were no vitals filed for this visit.  Subjective Assessment - 04/27/19 1518    Subjective  Patient stated that her wrist is feeling much better currently and denied any pain. She did state she still feels nervous about falling.    Pertinent History  CVA, diabetes    Limitations  Standing;Walking;House hold activities    Patient Stated Goals  stand up and walk by herself    Currently in Pain?  No/denies         [facility] PT Assessment - 04/27/19 1436      Assessment   Medical Diagnosis  Gait and balance deficits s/p CVA    Referring Provider (PT)  Sinda Du    Onset Date/Surgical Date  01/03/19      Precautions   Precautions  Fall    Precaution Comments  left hemiparesis      Restrictions   Weight Bearing Restrictions  No      Prior Function   Level of Independence  Needs assistance with ADLs;Needs assistance with gait;Needs assistance with homemaking;Needs  assistance with transfers      Cognition   Overall Cognitive Status  Within Functional Limits for tasks assessed      Transfers   Transfers  Sit to Stand    Sit to Stand  6: Modified independent (Device/Increase time)   with quad cane   Five time sit to stand comments   18.36 seconds   was 18.69 seconds     Balance   Balance Assessed  Yes      Static Standing Balance   Static Standing - Comment/# of Minutes  Tandem stance 3 seconds with left forward 1 second on the right forward      Berg Balance Test   Sit to Stand  Able to stand  independently using hands    Standing Unsupported  Able to stand 2 minutes with supervision    Sitting with Back Unsupported but Feet Supported on Floor or Stool  Able to sit safely and securely 2 minutes    Stand to Sit  Controls descent by using hands    Transfers  Able to transfer safely,  minor use of hands   was 3   Standing Unsupported with Eyes Closed  Able to stand 10 seconds with supervision    Standing Unsupported with Feet Together  Able to place feet together independently and stand for 1 minute with supervision    From Standing, Reach Forward with Outstretched Arm  Can reach forward >12 cm safely (5")    From Standing Position, Pick up Object from Floor  Able to pick up shoe, needs supervision    From Standing Position, Turn to Look Behind Over each Shoulder  Looks behind from both sides and weight shifts well    Turn 360 Degrees  Needs close supervision or verbal cueing    Standing Unsupported, Alternately Place Feet on Step/Stool  Needs assistance to keep from falling or unable to try    Standing Unsupported, One Foot in [employer] balance while stepping or standing    Standing on One Leg  Unable to try or needs assist to prevent fall    Total Score  34    Berg comment:  was 24                   [facility] Adult PT Treatment/Exercise - 04/27/19 1436      Knee/Hip Exercises: Seated   Other Seated Knee/Hip Exercises  Seated marching over 6'' hurdle x 10 each LE    Marching  Both;20 reps   max encouragement to not use UE   Sit to Sand  15 reps   Sit to stand with 1 step forward         Balance Exercises - 04/27/19 1523      Balance Exercises: Standing   Tandem Stance  Eyes open;2 reps;30 secs;Intermittent upper extremity support    Sidestepping  3 reps   in // bars with intermittent UE assist cues for form   Cone Rotation  Other (comment)    Cone Rotation Limitations  Standing reaching towards cone target moving up/down/LT/RT patient with RT UE CGA 2x1 minute        PT Education - 04/27/19 1519    Education Details  Discussed re-assessment findings.    Person(s) Educated  Patient    Methods  Explanation    Comprehension  Verbalized understanding       PT Short Term Goals - 04/27/19 1534      PT SHORT TERM GOAL #1  Title  Pt will be  independent with HEP and perform consistently in order to improve functional outcomes    Time  4    Period  Weeks    Status  Achieved    Target Date  04/27/19      PT SHORT TERM GOAL #2   Title  Patient will be able to complete 5x STS in under 14.8 seconds in order demonstrate improved LE strength to reduce the risk of falls.    Baseline  04/27/19: See objective measures    Time  4    Period  Weeks    Status  On-going    Target Date  04/27/19        PT Long Term Goals - 04/27/19 1534      PT LONG TERM GOAL #1   Title  Patient will increase score by at least 7 points on Berg balance scale in order to reduce the risk of falls.    Baseline  04/27/19: 34/56    Time  8    Period  Weeks    Status  On-going      PT LONG TERM GOAL #2   Title  Pt will be able to perform bil tandem stance for greater than 10 seconds without UE support in order to demo improved functional strength and balance.    Baseline  04/27/19: See objective measures    Time  8    Period  Weeks    Status  On-going      PT LONG TERM GOAL #3   Title  Patient will demonstrate ability to perform sit to stand transfer with modified independence without fear of falling to improve overall functional mobility.    Time  8    Period  Weeks    Status  Achieved            Plan - 04/27/19 1538    Clinical Impression Statement  Assessed patient's progress towards goals this session. Patient achieved 1 out of 2 short term goals and 1 out of 3 long term goals. Patient demonstrated improvements in independence of transfers, indicating ability to perform sit to stand this session Modified independent. Patient also demonstrated some improvement in balance with [facility] Scales as well as with tandem stance. Patient would benefit from continued skilled physical therapy in order to continue progressing towards functional goals.    Personal Factors and Comorbidities  Age;Comorbidity 2;Past/Current Experience;Time since onset of  injury/illness/exacerbation    Comorbidities  diabetes, history of CVA    Examination-Activity Limitations  Bathing;Caring for Others;Carry;Locomotion Level;Squat;Stairs;Stand;Transfers    Examination-Participation Restrictions  Community Activity;Laundry;Shop;Volunteer;Yard Work    [employer]  Evolving/Moderate complexity    Rehab Potential  Fair    PT Frequency  2x / week    PT Duration  8 weeks    PT Treatment/Interventions  ADLs/Self Care Home Management;Aquatic Therapy;Biofeedback;Electrical Stimulation;Iontophoresis 4mg /ml Dexamethasone;Moist Heat;Traction;Ultrasound;Parrafin;Fluidtherapy;Contrast Bath;DME Instruction;Gait training;Stair training;Functional mobility training;Therapeutic activities;Therapeutic exercise;Balance training;Neuromuscular re-education;Patient/family education;Orthotic Fit/Training;Manual techniques;Compression bandaging;Passive range of motion;Dry needling;Energy conservation;Splinting;Taping;Spinal Manipulations;Joint Manipulations    PT Next Visit Plan  Continue to progress BLE strenght and balance as tolerated. Add step over hurdles next visit. Work on turns and foot taps, as this is one area patient had diffiuclty with on Berg.    PT Home Exercise Plan  04/05/19: seated marching, seated HR/TR, LAQ    Consulted and Agree with Plan of Care  Patient       Patient will benefit from skilled therapeutic intervention in  order to improve the following deficits and impairments:  Abnormal gait, Decreased activity tolerance, Decreased balance, Decreased endurance, Decreased mobility, Decreased range of motion, Decreased strength, Difficulty walking, Impaired tone, Improper body mechanics  Visit Diagnosis: Other symptoms and signs involving the nervous system  Other abnormalities of gait and mobility  Muscle weakness (generalized)     Problem List Patient Active Problem List   Diagnosis Date Noted  . Osteoporosis 04/19/2019  . Encounter  for screening mammogram for malignant neoplasm of breast 04/19/2019  . Hyperlipidemia 04/19/2019  . 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 Carotid stenosis, bilateral 12/30/2018  . Stroke ([place]) 12/24/2018  . Stroke-like symptoms 12/23/2018  . Generalized weakness 06/09/2018  . Hypothyroidism (acquired) 05/03/2018  . Hypertension 05/03/2018  . Uncontrolled type 2 diabetes mellitus with diabetic nephropathy, with long-term current use of insulin ([place])   . Back pain at L4-L5 level 06/14/2013  . Muscle weakness (generalized) 12/17/2012  . Pain in joint, shoulder region 12/17/2012  . Shoulder fracture 12/14/2012  . Proximal humerus fracture 11/29/2012  . FRACTURE, TOE, RIGHT 09/10/2009   Clarene Critchley PT, DPT 3:44 PM, 04/27/19 [facility] [address] [place], [place], [address] Phone: [phone]   Fax:  [phone]  Name: Danielle [name] MRN: [id number] Date of Birth: 11-Apr-1932

## 2019-04-27 NOTE — Therapy (Signed)
[facility] [place], [place], [address] Phone: [phone]   Fax:  [phone]  Occupational Therapy Treatment  Patient Details  Name: Danielle [name] MRN: [id number] Date of Birth: 1931/12/07 Referring Provider (OT): Benny Lennert, MD (Dr. Sinda Du (retired))   Encounter Date: 04/27/2019  OT End of Session - 04/27/19 1434    Visit Number  12    Number of Visits  23    Date for OT Re-Evaluation  06/06/19    Authorization Type  [employer] Medicare Copay $35 No visit limit.    Authorization Time Period  progress note at visit 21    Authorization - Visit Number  12    Authorization - Number of Visits  21    OT Start Time  1350    OT Stop Time  1430    OT Time Calculation (min)  40 min    Activity Tolerance  Patient tolerated treatment well    Behavior During Therapy  WFL for tasks assessed/performed       Past Medical History:  Diagnosis Date  . Arthritis    "some joint pain once in awhile" (05/26/2017)  . Cataract   . CVA (cerebral vascular accident) ([place]) 2019  . Heart murmur   . History of kidney stones   . Hyperlipemia   . Hypertension   . Hypothyroidism (acquired)   . Osteoporosis   . Pneumothorax 02/05/2017   right-after fall  . Rib fractures 02/05/2017   right side  . Stroke ([place])   . Type II diabetes mellitus ([place])     Past Surgical History:  Procedure Laterality Date  . AUGMENTATION MAMMAPLASTY Bilateral   . COSMETIC SURGERY    . DILATION AND CURETTAGE OF UTERUS    . EYE SURGERY    . FRACTURE SURGERY    . JOINT REPLACEMENT    . REVERSE SHOULDER ARTHROPLASTY Right 05/26/2017  . REVERSE SHOULDER ARTHROPLASTY Right 05/26/2017   Procedure: REVERSE RIGHT SHOULDER ARTHROPLASTY;  Surgeon: Meredith Pel, MD;  Location: [facility];  Service: Orthopedics;  Laterality: Right;  . TONSILLECTOMY    . TUBAL LIGATION      There were no vitals filed for this visit.  Subjective Assessment - 04/27/19 1411    Subjective   S: I have noticed that my fingers are not as tight as they used to be.    Currently in Pain?  Yes    Pain Score  3     Pain Location  Wrist    Pain Orientation  Left    Pain Descriptors / Indicators  Sore;Other (Comment)   feels bruised   Pain Type  Acute pain    Pain Radiating Towards  N/A    Pain Onset  In the past 7 days    Pain Frequency  Occasional         [facility] OT Assessment - 04/27/19 1412      Assessment   Medical Diagnosis  Left arm weakness      Precautions   Precautions  Fall    Precaution Comments  left hemiparesis    Required Braces or Orthoses  Other Brace/Splint    Other Brace/Splint  night time resting hand splint. thumb brace for extension during the day                OT Treatments/Exercises (OP) - 04/27/19 1412      Exercises   Exercises  Shoulder;Wrist;Elbow      Shoulder Exercises:  Supine   Protraction  AAROM;5 reps    Protraction Limitations  therapist unweighted arm    Horizontal ABduction  AAROM;5 reps    Horizontal ABduction Limitations  therapist unweighted arm    External Rotation  AAROM;5 reps    External Rotation Weight (lbs)  therapist unweighted arm    Internal Rotation  AAROM;5 reps    Internal Rotation Limitations  therapist unweighted arm    Flexion  AAROM;5 reps    Flexion Limitations  therapist unweighted arm    ABduction  AAROM;5 reps    ABduction Limitations  therapist unweighted arm      Elbow Exercises   Elbow Extension  PROM;AROM;5 reps;Supine   intermittent physical assist   Forearm Supination  Seated;5 reps;PROM    Forearm Pronation  Seated;5 reps;PROM    Other elbow exercises  elbow flexion; 5X; supine; AA/ROM   intermittent physical assist     Wrist Exercises   Wrist Flexion  Seated;10 reps;PROM    Wrist Extension  Seated;10 reps;PROM               OT Short Term Goals - 04/04/19 1503      OT SHORT TERM GOAL #1   Title  Patient and family will be provided and educated on HEP as  well as any equipment or adaptive strategies to help aid with increasing the functional use of her LUE during ADL tasks.    Time  4    Period  Weeks    Target Date  03/28/19      OT SHORT TERM GOAL #2   Title  Patient will increase LUE P/ROM to [facility] in order to increase ability to complete dressing tasks.    Time  4    Period  Weeks    Status  On-going      OT SHORT TERM GOAL #3   Title  Patient will increase her left hand grip strength to 10# and her pinch to 5# in order to use her left hand to stabilize an object such as a cup or plate while eating.    Time  4    Period  Weeks    Status  On-going      OT SHORT TERM GOAL #4   Title  Patient will demonstrate/report the ability to use her LUE as a stabilizer during a basic daily task such as during meals or grooming.    Time  4    Period  Weeks    Status  On-going        OT Long Term Goals - 03/04/19 1657      OT LONG TERM GOAL #1   Title  patient will demonstrate the ability to use her left UE as an active assist during daily tasks.    Time  8    Period  Weeks    Status  On-going      OT LONG TERM GOAL #2   Title  Patient will increase her LUE A/ROM to allow her to complete functional tasks at waist level with less difficulty.    Time  8    Period  Weeks    Status  On-going      OT LONG TERM GOAL #3   Title  Patient will increase her left hand strength to 15# and her pinch strength to 8# in order to hold on to items with greater stability and less dropping.    Time  8    Period  Weeks  Status  On-going      OT LONG TERM GOAL #4   Title  Patient will demonstrate increased coordination by completing the 9 hole peg test with therapist providing set up of pegs.    Time  8    Period  Weeks    Status  On-going      OT LONG TERM GOAL #5   Title  Pt will improve her LUE strength to 3/5 in order to complete low level functional reaching tasks while at home requiring greater independence.    Time  8    Period  Weeks     Status  On-going            Plan - 04/27/19 1434    Clinical Impression Statement  A: Pt reports less soreness in her left wrist. She has not had it checked out and is currently monitoring it. Pt was able to tolerate slight P/ROM stretching to wrist and forearm. Completed supine AA/ROM with therapist unweighting LUE. patient demonstrating increased muscle strength as she was able to extend her elbow when cued as well as attempt to resis shoulder extension during exercises. VC for fom and technique were provided.    Body Structure / Function / Physical Skills  Balance;ADL;UE functional use;Fascial restriction;Flexibility;FMC;Proprioception;ROM;Coordination;GMC;Sensation;Decreased knowledge of use of DME;IADL;Strength;Edema;Tone    Plan  P: Continue with supine AA/ROM while unweighting LUE. Continue to monitor left wrist bruising and soreness. Complete any weightbearing activities on forearm for now until left wrist heals and is no longer sore.    Consulted and Agree with Plan of Care  Patient       Patient will benefit from skilled therapeutic intervention in order to improve the following deficits and impairments:   Body Structure / Function / Physical Skills: Balance, ADL, UE functional use, Fascial restriction, Flexibility, FMC, Proprioception, ROM, Coordination, GMC, Sensation, Decreased knowledge of use of DME, IADL, Strength, Edema, Tone       Visit Diagnosis: Other symptoms and signs involving the musculoskeletal system  Stiffness of left shoulder, not elsewhere classified  Other lack of coordination    Problem List Patient Active Problem List   Diagnosis Date Noted  . Osteoporosis 04/19/2019  . Encounter for screening mammogram for malignant neoplasm of breast 04/19/2019  . Hyperlipidemia 04/19/2019  . 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 Carotid stenosis, bilateral 12/30/2018  . Stroke ([place]) 12/24/2018  . Stroke-like symptoms 12/23/2018  .  Generalized weakness 06/09/2018  . Hypothyroidism (acquired) 05/03/2018  . Hypertension 05/03/2018  . Uncontrolled type 2 diabetes mellitus with diabetic nephropathy, with long-term current use of insulin ([place])   . Back pain at L4-L5 level 06/14/2013  . Muscle weakness (generalized) 12/17/2012  . Pain in joint, shoulder region 12/17/2012  . Shoulder fracture 12/14/2012  . Proximal humerus fracture 11/29/2012  . FRACTURE, TOE, RIGHT 09/10/2009   Ailene Ravel, OTR/L,CBIS  [phone]  04/27/2019, 2:40 PM  [facility] [address] [place], [place], [address] Phone: [phone]   Fax:  [phone]  Name: Danielle [name] MRN: [id number] Date of Birth: 07/25/1931

## 2019-05-03 ENCOUNTER — Ambulatory Visit (HOSPITAL_COMMUNITY): Payer: Medicare Other | Admitting: Occupational Therapy

## 2019-05-03 ENCOUNTER — Ambulatory Visit (HOSPITAL_COMMUNITY): Payer: Medicare Other

## 2019-05-03 ENCOUNTER — Encounter (HOSPITAL_COMMUNITY): Payer: Medicare Other | Admitting: Physical Therapy

## 2019-05-03 ENCOUNTER — Encounter (HOSPITAL_COMMUNITY): Payer: Self-pay

## 2019-05-03 ENCOUNTER — Other Ambulatory Visit: Payer: Self-pay

## 2019-05-03 ENCOUNTER — Encounter (HOSPITAL_COMMUNITY): Payer: Self-pay | Admitting: Occupational Therapy

## 2019-05-03 DIAGNOSIS — R29818 Other symptoms and signs involving the nervous system: Secondary | ICD-10-CM | POA: Diagnosis not present

## 2019-05-03 DIAGNOSIS — M6281 Muscle weakness (generalized): Secondary | ICD-10-CM

## 2019-05-03 DIAGNOSIS — R278 Other lack of coordination: Secondary | ICD-10-CM

## 2019-05-03 DIAGNOSIS — R2689 Other abnormalities of gait and mobility: Secondary | ICD-10-CM

## 2019-05-03 DIAGNOSIS — M25612 Stiffness of left shoulder, not elsewhere classified: Secondary | ICD-10-CM

## 2019-05-03 DIAGNOSIS — R29898 Other symptoms and signs involving the musculoskeletal system: Secondary | ICD-10-CM

## 2019-05-03 NOTE — Therapy (Signed)
[place], [place], [address] Phone: [phone]   Fax:  [phone]  Occupational Therapy Treatment  Patient Details  Name: Danielle [name] MRN: [id number] Date of Birth: 1931/05/24 Referring Provider (OT): Benny Lennert, MD (Dr. Sinda Du (retired))   Encounter Date: 05/03/2019  OT End of Session - 05/03/19 1625    Visit Number  13    Number of Visits  23    Date for OT Re-Evaluation  06/06/19    Authorization Type  [employer] Medicare Copay $35 No visit limit.    Authorization Time Period  progress note at visit 21    Authorization - Visit Number  13    Authorization - Number of Visits  21    OT Start Time  [id number]    OT Stop Time  1557    OT Time Calculation (min)  42 min    Activity Tolerance  Patient tolerated treatment well    Behavior During Therapy  WFL for tasks assessed/performed       Past Medical History:  Diagnosis Date  . Arthritis    "some joint pain once in awhile" (05/26/2017)  . Cataract   . CVA (cerebral vascular accident) ([facility]) 2019  . Heart murmur   . History of kidney stones   . Hyperlipemia   . Hypertension   . Hypothyroidism (acquired)   . Osteoporosis   . Pneumothorax 02/05/2017   right-after fall  . Rib fractures 02/05/2017   right side  . Stroke ([facility])   . Type II diabetes mellitus ([facility])     Past Surgical History:  Procedure Laterality Date  . AUGMENTATION MAMMAPLASTY Bilateral   . COSMETIC SURGERY    . DILATION AND CURETTAGE OF UTERUS    . EYE SURGERY    . FRACTURE SURGERY    . JOINT REPLACEMENT    . REVERSE SHOULDER ARTHROPLASTY Right 05/26/2017  . REVERSE SHOULDER ARTHROPLASTY Right 05/26/2017   Procedure: REVERSE RIGHT SHOULDER ARTHROPLASTY;  Surgeon: Meredith Pel, MD;  Location: [facility];  Service: Orthopedics;  Laterality: Right;  . TONSILLECTOMY    . TUBAL LIGATION      There were no vitals filed for this visit.  Subjective Assessment - 05/03/19 1516    Subjective   S: I've been trying to lift my arm up and out but I still can't use it.    Currently in Pain?  No/denies         [facility] OT Assessment - 05/03/19 1516      Assessment   Medical Diagnosis  Gait and balance deficits s/p CVA      Precautions   Precautions  Fall    Precaution Comments  left hemiparesis      Restrictions   Weight Bearing Restrictions  No               OT Treatments/Exercises (OP) - 05/03/19 1516      Exercises   Exercises  Shoulder;Wrist;Elbow      Shoulder Exercises: Supine   Protraction  AAROM;10 reps;Limitations    Protraction Limitations  min guard for OT unweighting arm    Horizontal ABduction  AAROM;10 reps;Limitations    Horizontal ABduction Limitations  min guard to unweight arm    External Rotation  AAROM;10 reps;Limitations    External Rotation Weight (lbs)  therapist unweighted arm    Internal Rotation  AAROM;10 reps;Limitations    Internal Rotation Limitations  therapist unweighted arm    Flexion  AAROM;10 reps;Limitations    Flexion Limitations  min guard to unweight arm    ABduction  AAROM;10 reps;Limitations    ABduction Limitations  therapist unweighted arm      Shoulder Exercises: Isometric Strengthening   Flexion  Supine;3X5"    Extension  Supine;3X5"    External Rotation  Limitations    External Rotation Limitations  unable to complete    Internal Rotation  Supine;3X5"    ABduction  Supine;3X5"      Elbow Exercises   Elbow Extension  AROM;10 reps;Supine    Other elbow exercises  elbow flexion; 10X; supine; A/ROM   occasionally 2 finger assist to keep in flexion plane     Neurological Re-education Exercises   Other Exercises 1  table wipe: pt holding left hand with right hand and using both to wipe mat table raised to highest position, working on flexion and wiping in circles each direction    Other Weight-Bearing Exercises 1  Pt positioned standing at mat table, bent at waist and weightbearing on LUE while  reaching with RUE to place squigs on mirror. Pt took short standing rest break and then removed from mirror. Working on weightbearing on left forearm, verbal cuing and tactile assist to position arm and maintain correct position during task.                OT Short Term Goals - 04/04/19 1503      OT SHORT TERM GOAL #1   Title  Patient and family will be provided and educated on HEP as well as any equipment or adaptive strategies to help aid with increasing the functional use of her LUE during ADL tasks.    Time  4    Period  Weeks    Target Date  03/28/19      OT SHORT TERM GOAL #2   Title  Patient will increase LUE P/ROM to [facility] in order to increase ability to complete dressing tasks.    Time  4    Period  Weeks    Status  On-going      OT SHORT TERM GOAL #3   Title  Patient will increase her left hand grip strength to 10# and her pinch to 5# in order to use her left hand to stabilize an object such as a cup or plate while eating.    Time  4    Period  Weeks    Status  On-going      OT SHORT TERM GOAL #4   Title  Patient will demonstrate/report the ability to use her LUE as a stabilizer during a basic daily task such as during meals or grooming.    Time  4    Period  Weeks    Status  On-going        OT Long Term Goals - 03/04/19 1657      OT LONG TERM GOAL #1   Title  patient will demonstrate the ability to use her left UE as an active assist during daily tasks.    Time  8    Period  Weeks    Status  On-going      OT LONG TERM GOAL #2   Title  Patient will increase her LUE A/ROM to allow her to complete functional tasks at waist level with less difficulty.    Time  8    Period  Weeks    Status  On-going      OT LONG TERM GOAL #3  Title  Patient will increase her left hand strength to 15# and her pinch strength to 8# in order to hold on to items with greater stability and less dropping.    Time  8    Period  Weeks    Status  On-going      OT LONG TERM GOAL  #4   Title  Patient will demonstrate increased coordination by completing the 9 hole peg test with therapist providing set up of pegs.    Time  8    Period  Weeks    Status  On-going      OT LONG TERM GOAL #5   Title  Pt will improve her LUE strength to 3/5 in order to complete low level functional reaching tasks while at home requiring greater independence.    Time  8    Period  Weeks    Status  On-going            Plan - 05/03/19 1625    Clinical Impression Statement  A: Pt reports continued soreness in her wrist, completed weightbearing/shifting task in standing with weight on forearm. Pt able to complete AA/ROM with min guard from OT in case LUE required unweighted, also completed A/ROM elbow flexion in supine. Added table wipe today, verbal cuing for correct completion and working to not use only RUE but working to activate LUE as able.    Body Structure / Function / Physical Skills  Balance;ADL;UE functional use;Fascial restriction;Flexibility;FMC;Proprioception;ROM;Coordination;GMC;Sensation;Decreased knowledge of use of DME;IADL;Strength;Edema;Tone    Plan  P: Continue with AA/ROM in supine unweighting as needed. Continue with table wipe and follow up on practicing at home.       Patient will benefit from skilled therapeutic intervention in order to improve the following deficits and impairments:   Body Structure / Function / Physical Skills: Balance, ADL, UE functional use, Fascial restriction, Flexibility, FMC, Proprioception, ROM, Coordination, GMC, Sensation, Decreased knowledge of use of DME, IADL, Strength, Edema, Tone       Visit Diagnosis: Other symptoms and signs involving the musculoskeletal system  Other lack of coordination  Stiffness of left shoulder, not elsewhere classified    Problem List Patient Active Problem List   Diagnosis Date Noted  . Osteoporosis 04/19/2019  . Encounter for screening mammogram for malignant neoplasm of breast 04/19/2019  .  Hyperlipidemia 04/19/2019  . 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 Carotid stenosis, bilateral 12/30/2018  . Stroke ([place]) 12/24/2018  . Stroke-like symptoms 12/23/2018  . Generalized weakness 06/09/2018  . Hypothyroidism (acquired) 05/03/2018  . Hypertension 05/03/2018  . Uncontrolled type 2 diabetes mellitus with diabetic nephropathy, with long-term current use of insulin ([place])   . Back pain at L4-L5 level 06/14/2013  . Muscle weakness (generalized) 12/17/2012  . Pain in joint, shoulder region 12/17/2012  . Shoulder fracture 12/14/2012  . Proximal humerus fracture 11/29/2012  . FRACTURE, TOE, RIGHT 09/10/2009   Guadelupe Sabin, OTR/L  [phone] 05/03/2019, 4:29 PM  [facility] [address] [place], [place], [address] Phone: [phone]   Fax:  [phone]  Name: OLINKA ALLING MRN: [id number] Date of Birth: 1931/05/21

## 2019-05-03 NOTE — Therapy (Signed)
[place], [place], [address] Phone: [phone]   Fax:  [phone]  Physical Therapy Treatment  Patient Details  Name: Danielle [name] MRN: [id number] Date of Birth: 07/27/1931 Referring Provider (PT): Sinda Du   Encounter Date: 05/03/2019  PT End of Session - 05/03/19 1439    Visit Number  7    Number of Visits  16    Date for PT Re-Evaluation  05/25/19   mini reassess:04/27/19   Authorization Type  [employer] Medicare - based on medical necessity    Authorization Time Period  complete progress note at visit 10; 03/30/19-05/24/2018    Authorization - Visit Number  2    Authorization - Number of Visits  10    PT Start Time  [id number]    PT Stop Time  1515    PT Time Calculation (min)  41 min    Equipment Utilized During Treatment  Gait belt;Other (comment)   Quad cane   Activity Tolerance  Patient tolerated treatment well    Behavior During Therapy  WFL for tasks assessed/performed       Past Medical History:  Diagnosis Date  . Arthritis    "some joint pain once in awhile" (05/26/2017)  . Cataract   . CVA (cerebral vascular accident) ([facility]) 2019  . Heart murmur   . History of kidney stones   . Hyperlipemia   . Hypertension   . Hypothyroidism (acquired)   . Osteoporosis   . Pneumothorax 02/05/2017   right-after fall  . Rib fractures 02/05/2017   right side  . Stroke ([place])   . Type II diabetes mellitus ([place])     Past Surgical History:  Procedure Laterality Date  . AUGMENTATION MAMMAPLASTY Bilateral   . COSMETIC SURGERY    . DILATION AND CURETTAGE OF UTERUS    . EYE SURGERY    . FRACTURE SURGERY    . JOINT REPLACEMENT    . REVERSE SHOULDER ARTHROPLASTY Right 05/26/2017  . REVERSE SHOULDER ARTHROPLASTY Right 05/26/2017   Procedure: REVERSE RIGHT SHOULDER ARTHROPLASTY;  Surgeon: Meredith Pel, MD;  Location: [facility];  Service: Orthopedics;  Laterality: Right;  . TONSILLECTOMY    . TUBAL LIGATION       There were no vitals filed for this visit.  Subjective Assessment - 05/03/19 1438    Subjective  Pt reports L wrist is sore today. Pt reports no falls and she is so afraid she will fall or have another stroke.    Pertinent History  CVA, diabetes    Limitations  Standing;Walking;House hold activities    Patient Stated Goals  stand up and walk by herself    Currently in Pain?  No/denies            [facility] Adult PT Treatment/Exercise - 05/03/19 0001      Ambulation/Gait   Ambulation/Gait  Yes    Assistive device  Large base quad cane    Gait Comments  straightline gait and turns with focus on L foot dorsiflexion to clear toes; with and without AD; fair to poor L foot clearance and increased speed increasing risk for falls, both improved with verbal cues; mod assist with loss of balance due to poor stepping reactions      Knee/Hip Exercises: Standing   Forward Step Up  Both;10 reps;Hand Hold: 1;Step Height: 4"          Balance Exercises - 05/03/19 1501      Balance Exercises: Standing  Tandem Stance  Eyes open;2 reps;30 secs    Tandem Gait  2 reps   blue line, quad cane   Retro Gait  5 reps   // bars without UE support   Sidestepping  3 reps   // bars   Lift / Chop Limitations  forward walking, // bars, no UE assist, x5 rounds through    Other Standing Exercises  flat foot taps to 4" box, x10 reps each LE        PT Education - 05/03/19 1439    Education Details  Exercise technique, continue HEP    Person(s) Educated  Patient    Methods  Explanation    Comprehension  Verbalized understanding       PT Short Term Goals - 04/27/19 1534      PT SHORT TERM GOAL #1   Title  Pt will be independent with HEP and perform consistently in order to improve functional outcomes    Time  4    Period  Weeks    Status  Achieved    Target Date  04/27/19      PT SHORT TERM GOAL #2   Title  Patient will be able to complete 5x STS in under 14.8 seconds in order demonstrate  improved LE strength to reduce the risk of falls.    Baseline  04/27/19: See objective measures    Time  4    Period  Weeks    Status  On-going    Target Date  04/27/19        PT Long Term Goals - 04/27/19 1534      PT LONG TERM GOAL #1   Title  Patient will increase score by at least 7 points on Berg balance scale in order to reduce the risk of falls.    Baseline  04/27/19: 34/56    Time  8    Period  Weeks    Status  On-going      PT LONG TERM GOAL #2   Title  Pt will be able to perform bil tandem stance for greater than 10 seconds without UE support in order to demo improved functional strength and balance.    Baseline  04/27/19: See objective measures    Time  8    Period  Weeks    Status  On-going      PT LONG TERM GOAL #3   Title  Patient will demonstrate ability to perform sit to stand transfer with modified independence without fear of falling to improve overall functional mobility.    Time  8    Period  Weeks    Status  Achieved            Plan - 05/03/19 1509    Clinical Impression Statement  Pt performs flat foot taps to 4" step and step ups to 4" step with poor L foot clearance 50% of the time, able to improve dorsiflexion with verbal cues to improve pulling toes up to get foot onto step better. Added retrogait in // bars this date, pt with fair balance, requiring occasional UE reaching for bars to maintain balance. Constant verbal cues for increasing L foot dorsiflexion to decrease toe drag and improve step length. Added tandem gait using quad cane on the blue line demonstrating fair to poor balance, unable to recover with loss of balance requiring moderate assist from therapist to maintain upright balance. Pt demonstrates poor protective reactions with dynamic balance activities. Continue to progress as able.  Personal Factors and Comorbidities  Age;Comorbidity 2;Past/Current Experience;Time since onset of injury/illness/exacerbation    Comorbidities  diabetes,  history of CVA    Examination-Activity Limitations  Bathing;Caring for Others;Carry;Locomotion Level;Squat;Stairs;Stand;Transfers    Examination-Participation Restrictions  Community Activity;Laundry;Shop;Volunteer;Yard Work    [employer]  Evolving/Moderate complexity    Rehab Potential  Fair    PT Frequency  2x / week    PT Duration  8 weeks    PT Treatment/Interventions  ADLs/Self Care Home Management;Aquatic Therapy;Biofeedback;Electrical Stimulation;Iontophoresis 4mg /ml Dexamethasone;Moist Heat;Traction;Ultrasound;Parrafin;Fluidtherapy;Contrast Bath;DME Instruction;Gait training;Stair training;Functional mobility training;Therapeutic activities;Therapeutic exercise;Balance training;Neuromuscular re-education;Patient/family education;Orthotic Fit/Training;Manual techniques;Compression bandaging;Passive range of motion;Dry needling;Energy conservation;Splinting;Taping;Spinal Manipulations;Joint Manipulations    PT Next Visit Plan  Continue to progress BLE strength and balance as tolerated. Add step over hurdles and stepping strategies next visit. Continue foot taps, retro gait.    PT Home Exercise Plan  04/05/19: seated marching, seated HR/TR, LAQ    Consulted and Agree with Plan of Care  Patient       Patient will benefit from skilled therapeutic intervention in order to improve the following deficits and impairments:  Abnormal gait, Decreased activity tolerance, Decreased balance, Decreased endurance, Decreased mobility, Decreased range of motion, Decreased strength, Difficulty walking, Impaired tone, Improper body mechanics  Visit Diagnosis: Other symptoms and signs involving the nervous system  Other abnormalities of gait and mobility  Muscle weakness (generalized)     Problem List Patient Active Problem List   Diagnosis Date Noted  . Osteoporosis 04/19/2019  . Encounter for screening mammogram for malignant neoplasm of breast 04/19/2019  . Hyperlipidemia  04/19/2019  . Right middle cerebral artery stroke ([place]) 01/03/2019  . Carotid stenosis, bilateral 12/30/2018  . Stroke ([place]) 12/24/2018  . Stroke-like symptoms 12/23/2018  . Generalized weakness 06/09/2018  . Hypothyroidism (acquired) 05/03/2018  . Hypertension 05/03/2018  . Uncontrolled type 2 diabetes mellitus with diabetic nephropathy, with long-term current use of insulin ([place])   . Back pain at L4-L5 level 06/14/2013  . Muscle weakness (generalized) 12/17/2012  . Pain in joint, shoulder region 12/17/2012  . Shoulder fracture 12/14/2012  . Proximal humerus fracture 11/29/2012  . FRACTURE, TOE, RIGHT 09/10/2009    Talbot Grumbling PT, DPT 05/03/19, 3:18 PM [facility] [address] [place], [place], [address] Phone: [phone]   Fax:  [phone]  Name: Danielle [name] MRN: [id number] Date of Birth: [birth year]

## 2019-05-04 ENCOUNTER — Encounter (HOSPITAL_COMMUNITY): Payer: Self-pay | Admitting: Occupational Therapy

## 2019-05-04 ENCOUNTER — Ambulatory Visit (HOSPITAL_COMMUNITY): Payer: Medicare Other | Admitting: Occupational Therapy

## 2019-05-04 ENCOUNTER — Encounter (HOSPITAL_COMMUNITY): Payer: Self-pay | Admitting: Physical Therapy

## 2019-05-04 ENCOUNTER — Ambulatory Visit (HOSPITAL_COMMUNITY): Payer: Medicare Other | Admitting: Physical Therapy

## 2019-05-04 DIAGNOSIS — R29898 Other symptoms and signs involving the musculoskeletal system: Secondary | ICD-10-CM

## 2019-05-04 DIAGNOSIS — R29818 Other symptoms and signs involving the nervous system: Secondary | ICD-10-CM

## 2019-05-04 DIAGNOSIS — R2689 Other abnormalities of gait and mobility: Secondary | ICD-10-CM

## 2019-05-04 DIAGNOSIS — M6281 Muscle weakness (generalized): Secondary | ICD-10-CM

## 2019-05-04 DIAGNOSIS — M25612 Stiffness of left shoulder, not elsewhere classified: Secondary | ICD-10-CM

## 2019-05-04 DIAGNOSIS — R278 Other lack of coordination: Secondary | ICD-10-CM

## 2019-05-04 NOTE — Therapy (Signed)
[place], [place], [address] Phone: [phone]   Fax:  [phone]  Physical Therapy Treatment  Patient Details  Name: Danielle [name] MRN: [id number] Date of Birth: 03/16/1932 Referring Provider (PT): Sinda Du   Encounter Date: 05/04/2019  PT End of Session - 05/04/19 1504    Visit Number  8    Number of Visits  16    Date for PT Re-Evaluation  05/25/19   mini reassess:04/27/19   Authorization Type  [employer] Medicare - based on medical necessity    Authorization Time Period  complete progress note at visit 10; 03/30/19-05/24/2018    Authorization - Visit Number  3    Authorization - Number of Visits  10    PT Start Time  [id number]    PT Stop Time  1435    PT Time Calculation (min)  44 min    Equipment Utilized During Treatment  Gait belt;Other (comment)   Quad cane   Activity Tolerance  Patient tolerated treatment well    Behavior During Therapy  WFL for tasks assessed/performed       Past Medical History:  Diagnosis Date  . Arthritis    "some joint pain once in awhile" (05/26/2017)  . Cataract   . CVA (cerebral vascular accident) ([facility]) 2019  . Heart murmur   . History of kidney stones   . Hyperlipemia   . Hypertension   . Hypothyroidism (acquired)   . Osteoporosis   . Pneumothorax 02/05/2017   right-after fall  . Rib fractures 02/05/2017   right side  . Stroke ([facility] [place])   . Type II diabetes mellitus ([place])     Past Surgical History:  Procedure Laterality Date  . AUGMENTATION MAMMAPLASTY Bilateral   . COSMETIC SURGERY    . DILATION AND CURETTAGE OF UTERUS    . EYE SURGERY    . FRACTURE SURGERY    . JOINT REPLACEMENT    . REVERSE SHOULDER ARTHROPLASTY Right 05/26/2017  . REVERSE SHOULDER ARTHROPLASTY Right 05/26/2017   Procedure: REVERSE RIGHT SHOULDER ARTHROPLASTY;  Surgeon: Meredith Pel, MD;  Location: [facility];  Service: Orthopedics;  Laterality: Right;  . TONSILLECTOMY    . TUBAL LIGATION       There were no vitals filed for this visit.  Subjective Assessment - 05/04/19 1354    Subjective  Patient states she has been feeling good except for her arm is sore from OT yesterday. Her legs are feeling good today. She has not had any falls lately.    Pertinent History  CVA, diabetes    Limitations  Standing;Walking;House hold activities    Patient Stated Goals  stand up and walk by herself    Currently in Pain?  Yes    Pain Score  8     Pain Location  Wrist                       [facility] Adult PT Treatment/Exercise - 05/04/19 0001      Ambulation/Gait   Ambulation/Gait  Yes    Ambulation/Gait Assistance  4: Min guard    Ambulation Distance (Feet)  150 Feet    Assistive device  Large base quad cane    Gait Pattern  Step-through pattern;Decreased step length - right;Decreased step length - left;Decreased stride length;Poor foot clearance - left    Ambulation Surface  Level;Indoor    Gait Comments  straightline gait and turns with focus on L foot  dorsiflexion to clear toes; with and without AD; fair to poor L foot clearance, frequent verbal cueing for foot clearance; occasional loss of balance      Knee/Hip Exercises: Standing   Forward Step Up  Both;10 reps;Hand Hold: 1;Step Height: 4"          Balance Exercises - 05/04/19 1451      Balance Exercises: Standing   Standing Eyes Opened  Narrow base of support (BOS);Solid surface;2 reps;30 secs    Tandem Stance  Eyes open;2 reps;30 secs    Tandem Gait  2 reps   blue line, with quad cane    Retro Gait  5 reps   blue line, no AD   Sidestepping  3 reps   blue line, without AD and frequent verbal cueing   Step Over Hurdles / Cones  stepping over hurdles - 8 x 4 hurdles with frequent verbal cueing for foot clearance    Other Standing Exercises  flat foot taps to 4" box, x10 reps each LE        PT Education - 05/04/19 1401    Education Details  Patient educated on completing HEP    Person(s) Educated   Patient    Methods  Explanation    Comprehension  Verbalized understanding       PT Short Term Goals - 04/27/19 1534      PT SHORT TERM GOAL #1   Title  Pt will be independent with HEP and perform consistently in order to improve functional outcomes    Time  4    Period  Weeks    Status  Achieved    Target Date  04/27/19      PT SHORT TERM GOAL #2   Title  Patient will be able to complete 5x STS in under 14.8 seconds in order demonstrate improved LE strength to reduce the risk of falls.    Baseline  04/27/19: See objective measures    Time  4    Period  Weeks    Status  On-going    Target Date  04/27/19        PT Long Term Goals - 04/27/19 1534      PT LONG TERM GOAL #1   Title  Patient will increase score by at least 7 points on Berg balance scale in order to reduce the risk of falls.    Baseline  04/27/19: 34/56    Time  8    Period  Weeks    Status  On-going      PT LONG TERM GOAL #2   Title  Pt will be able to perform bil tandem stance for greater than 10 seconds without UE support in order to demo improved functional strength and balance.    Baseline  04/27/19: See objective measures    Time  8    Period  Weeks    Status  On-going      PT LONG TERM GOAL #3   Title  Patient will demonstrate ability to perform sit to stand transfer with modified independence without fear of falling to improve overall functional mobility.    Time  8    Period  Weeks    Status  Achieved            Plan - 05/04/19 1505    Clinical Impression Statement  Patient continues to ambulate with impaired L foot clearance despite verbal cueing especially with fatigue. She has difficulty with tandem stance and gait and requires  frequent use of UE to maintain balance. She requires contact guard/min assist with all balance exercises without AD to maintain balance and safety. She does well with hurdle step overs but demonstrates impaired foot clearance and catches LLE frequently. Patient will  continue to benefit from skilled physical therapy in order to improve function and reduce impairment.    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/Moderate complexity    Rehab Potential  Fair    PT Frequency  2x / week    PT Duration  8 weeks    PT Treatment/Interventions  ADLs/Self Care Home Management;Aquatic Therapy;Biofeedback;Electrical Stimulation;Iontophoresis 4mg /ml Dexamethasone;Moist Heat;Traction;Ultrasound;Parrafin;Fluidtherapy;Contrast Bath;DME Instruction;Gait training;Stair training;Functional mobility training;Therapeutic activities;Therapeutic exercise;Balance training;Neuromuscular re-education;Patient/family education;Orthotic Fit/Training;Manual techniques;Compression bandaging;Passive range of motion;Dry needling;Energy conservation;Splinting;Taping;Spinal Manipulations;Joint Manipulations    PT Next Visit Plan  Continue to progress BLE strength and balance as tolerated. Add stepping strategies next visit. Continue foot taps, retro gait and hurdles.    PT Home Exercise Plan  04/05/19: seated marching, seated HR/TR, LAQ    Consulted and Agree with Plan of Care  Patient       Patient will benefit from skilled therapeutic intervention in order to improve the following deficits and impairments:  Abnormal gait, Decreased activity tolerance, Decreased balance, Decreased endurance, Decreased mobility, Decreased range of motion, Decreased strength, Difficulty walking, Impaired tone, Improper body mechanics  Visit Diagnosis: Other symptoms and signs involving the nervous system  Other abnormalities of gait  and mobility  Muscle weakness (generalized)     Problem List Patient Active Problem List   Diagnosis Date Noted  . Osteoporosis 04/19/2019  . Encounter for screening mammogram for malignant neoplasm of breast 04/19/2019  . Hyperlipidemia 04/19/2019  . Right middle cerebral artery stroke ([facility]) 01/03/2019  . Carotid stenosis, bilateral 12/30/2018  . Stroke ([facility]) 12/24/2018  . Stroke-like symptoms 12/23/2018  . Generalized weakness 06/09/2018  . Hypothyroidism (acquired) 05/03/2018  . Hypertension 05/03/2018  . Uncontrolled type 2 diabetes mellitus with diabetic nephropathy, with long-term current use of insulin ([facility])   . Back pain at L4-L5 level 06/14/2013  . Muscle weakness (generalized) 12/17/2012  . Pain in joint, shoulder region 12/17/2012  . Shoulder fracture 12/14/2012  . Proximal humerus fracture 11/29/2012  . FRACTURE, TOE, RIGHT 09/10/2009    3:15 PM, 05/04/19 Mearl Latin PT, DPT Physical Therapist at [facility] [place], [place], [address] Phone: [phone]   Fax:  [phone]  Name: KATHERIN PARRELLA MRN: [id number] Date of Birth: 1931/07/26

## 2019-05-04 NOTE — Therapy (Signed)
[place], [place], [address] Phone: [phone]   Fax:  [phone]  Occupational Therapy Treatment  Patient Details  Name: Danielle [name] MRN: [id number] Date of Birth: June 27, 1931 Referring Provider (OT): Benny Lennert, MD (Dr. Sinda Du (retired))   Encounter Date: 05/04/2019  OT End of Session - 05/04/19 1527    Visit Number  14    Number of Visits  23    Date for OT Re-Evaluation  06/06/19    Authorization Type  [employer] Medicare Copay $35 No visit limit.    Authorization Time Period  progress note at visit 21    Authorization - Visit Number  14    Authorization - Number of Visits  21    OT Start Time  [id number]    OT Stop Time  1522    OT Time Calculation (min)  40 min    Activity Tolerance  Patient tolerated treatment well    Behavior During Therapy  WFL for tasks assessed/performed       Past Medical History:  Diagnosis Date  . Arthritis    "some joint pain once in awhile" (05/26/2017)  . Cataract   . CVA (cerebral vascular accident) ([facility]) 2019  . Heart murmur   . History of kidney stones   . Hyperlipemia   . Hypertension   . Hypothyroidism (acquired)   . Osteoporosis   . Pneumothorax 02/05/2017   right-after fall  . Rib fractures 02/05/2017   right side  . Stroke ([facility])   . Type II diabetes mellitus ([facility])     Past Surgical History:  Procedure Laterality Date  . AUGMENTATION MAMMAPLASTY Bilateral   . COSMETIC SURGERY    . DILATION AND CURETTAGE OF UTERUS    . EYE SURGERY    . FRACTURE SURGERY    . JOINT REPLACEMENT    . REVERSE SHOULDER ARTHROPLASTY Right 05/26/2017  . REVERSE SHOULDER ARTHROPLASTY Right 05/26/2017   Procedure: REVERSE RIGHT SHOULDER ARTHROPLASTY;  Surgeon: Meredith Pel, MD;  Location: [facility];  Service: Orthopedics;  Laterality: Right;  . TONSILLECTOMY    . TUBAL LIGATION      There were no vitals filed for this visit.  Subjective Assessment - 05/04/19 1446    Subjective   S: My wrist has really been sore.    Currently in Pain?  Yes    Pain Score  8     Pain Location  Wrist    Pain Orientation  Left    Pain Descriptors / Indicators  Sore    Pain Type  Acute pain    Pain Radiating Towards  N/A    Pain Onset  Yesterday    Pain Frequency  Occasional    Aggravating Factors   touching it    Pain Relieving Factors  rest    Effect of Pain on Daily Activities  Pt unable to utilize her LUE for any daily tasks at this time.    Multiple Pain Sites  No         [facility] OT Assessment - 05/04/19 1446      Assessment   Medical Diagnosis  Gait and balance deficits s/p CVA      Precautions   Precautions  Fall    Precaution Comments  left hemiparesis      Restrictions   Weight Bearing Restrictions  No               OT Treatments/Exercises (OP) - 05/04/19 1447  Exercises   Exercises  Shoulder;Wrist;Elbow      Shoulder Exercises: Supine   Protraction  AAROM;10 reps;Limitations    Protraction Limitations  min guard for OT unweighting arm    Horizontal ABduction  AAROM;10 reps;Limitations    Horizontal ABduction Limitations  min guard to unweight arm    External Rotation  AAROM;10 reps;Limitations    External Rotation Weight (lbs)  therapist unweighted arm    Internal Rotation  AAROM;10 reps;Limitations    Internal Rotation Limitations  therapist unweighted arm    Flexion  AAROM;10 reps;Limitations    Flexion Limitations  min guard to unweight arm      Shoulder Exercises: Isometric Strengthening   Flexion  Supine;3X5"    Extension  Supine;3X5"    External Rotation  Limitations    External Rotation Limitations  unable to complete    Internal Rotation  Supine;3X5"    ABduction  Supine;3X5"      Elbow Exercises   Elbow Extension  AROM;5 reps;Supine    Other elbow exercises  elbow flexion; 5X; supine; A/ROM      Neurological Re-education Exercises   Other Exercises 1  Table wipe: pt holding rolled up washcloth with both hands  and wiping to left corner, middle, and right corner of mat table, 10X each     Other Exercises 2  Ball roll: Pt standing at mat table raised to just below waist height. LUE placed on medium sized pink ball and pt rolling ball foward and angling to each corner, OT providing min guard as need to keep arm on ball. Pt with active flexion in shoulder for task. Pt then completed in abduction with max effort.                OT Short Term Goals - 04/04/19 1503      OT SHORT TERM GOAL #1   Title  Patient and family will be provided and educated on HEP as well as any equipment or adaptive strategies to help aid with increasing the functional use of her LUE during ADL tasks.    Time  4    Period  Weeks    Target Date  03/28/19      OT SHORT TERM GOAL #2   Title  Patient will increase LUE P/ROM to [facility] in order to increase ability to complete dressing tasks.    Time  4    Period  Weeks    Status  On-going      OT SHORT TERM GOAL #3   Title  Patient will increase her left hand grip strength to 10# and her pinch to 5# in order to use her left hand to stabilize an object such as a cup or plate while eating.    Time  4    Period  Weeks    Status  On-going      OT SHORT TERM GOAL #4   Title  Patient will demonstrate/report the ability to use her LUE as a stabilizer during a basic daily task such as during meals or grooming.    Time  4    Period  Weeks    Status  On-going        OT Long Term Goals - 03/04/19 1657      OT LONG TERM GOAL #1   Title  patient will demonstrate the ability to use her left UE as an active assist during daily tasks.    Time  8    Period  Weeks  Status  On-going      OT LONG TERM GOAL #2   Title  Patient will increase her LUE A/ROM to allow her to complete functional tasks at waist level with less difficulty.    Time  8    Period  Weeks    Status  On-going      OT LONG TERM GOAL #3   Title  Patient will increase her left hand strength to 15# and her  pinch strength to 8# in order to hold on to items with greater stability and less dropping.    Time  8    Period  Weeks    Status  On-going      OT LONG TERM GOAL #4   Title  Patient will demonstrate increased coordination by completing the 9 hole peg test with therapist providing set up of pegs.    Time  8    Period  Weeks    Status  On-going      OT LONG TERM GOAL #5   Title  Pt will improve her LUE strength to 3/5 in order to complete low level functional reaching tasks while at home requiring greater independence.    Time  8    Period  Weeks    Status  On-going            Plan - 05/04/19 1527    Clinical Impression Statement  A: Pt reports increased soreness in wrist today, modified exercises as needed. Pt with improvement in muscle activation and form during AA/ROM exercises today. Did not complete abduction due to wrist pain. Added ball roll at table height with successful completion. Verbal cuing for form and technique, rest breaks provided as needed. At end of session pt reporting decreased soreness in left wrist.    Body Structure / Function / Physical Skills  Balance;ADL;UE functional use;Fascial restriction;Flexibility;FMC;Proprioception;ROM;Coordination;GMC;Sensation;Decreased knowledge of use of DME;IADL;Strength;Edema;Tone    Plan  P: Continue with table wipe and ball rolling. Follow up on home practice. Resume weightbearing if pt able to tolerate on forearm       Patient will benefit from skilled therapeutic intervention in order to improve the following deficits and impairments:   Body Structure / Function / Physical Skills: Balance, ADL, UE functional use, Fascial restriction, Flexibility, FMC, Proprioception, ROM, Coordination, GMC, Sensation, Decreased knowledge of use of DME, IADL, Strength, Edema, Tone       Visit Diagnosis: Other symptoms and signs involving the musculoskeletal system  Other lack of coordination  Stiffness of left shoulder, not  elsewhere classified    Problem List Patient Active Problem List   Diagnosis Date Noted  . Osteoporosis 04/19/2019  . Encounter for screening mammogram for malignant neoplasm of breast 04/19/2019  . Hyperlipidemia 04/19/2019  . Right middle cerebral artery stroke ([place]) 01/03/2019  . Carotid stenosis, bilateral 12/30/2018  . Stroke ([facility]) 12/24/2018  . Stroke-like symptoms 12/23/2018  . Generalized weakness 06/09/2018  . Hypothyroidism (acquired) 05/03/2018  . Hypertension 05/03/2018  . Uncontrolled type 2 diabetes mellitus with diabetic nephropathy, with long-term current use of insulin ([place] [facility])   . Back pain at L4-L5 level 06/14/2013  . Muscle weakness (generalized) 12/17/2012  . Pain in joint, shoulder region 12/17/2012  . Shoulder fracture 12/14/2012  . Proximal humerus fracture 11/29/2012  . FRACTURE, TOE, RIGHT 09/10/2009   Guadelupe Sabin, OTR/L  [phone] 05/04/2019, 3:30 PM  [facility] [address] [place], [place], [address] Phone: [phone]   Fax:  [phone]  Name: Danielle [name] MRN: [id number] Date of Birth: Mar 02, 1932

## 2019-05-09 ENCOUNTER — Other Ambulatory Visit: Payer: Self-pay

## 2019-05-09 ENCOUNTER — Ambulatory Visit (HOSPITAL_COMMUNITY): Payer: Medicare Other | Admitting: Occupational Therapy

## 2019-05-09 ENCOUNTER — Encounter (HOSPITAL_COMMUNITY): Payer: Self-pay | Admitting: Occupational Therapy

## 2019-05-09 DIAGNOSIS — R29898 Other symptoms and signs involving the musculoskeletal system: Secondary | ICD-10-CM

## 2019-05-09 DIAGNOSIS — R29818 Other symptoms and signs involving the nervous system: Secondary | ICD-10-CM | POA: Diagnosis not present

## 2019-05-09 DIAGNOSIS — R278 Other lack of coordination: Secondary | ICD-10-CM

## 2019-05-09 DIAGNOSIS — M25612 Stiffness of left shoulder, not elsewhere classified: Secondary | ICD-10-CM

## 2019-05-09 NOTE — Therapy (Signed)
[place], [place], [address] Phone: [phone]   Fax:  [phone]  Occupational Therapy Treatment  Patient Details  Name: Danielle [name] MRN: [id number] Date of Birth: Jun 04, 1931 Referring Provider (OT): Benny Lennert, MD (Dr. Sinda Du (retired))   Encounter Date: 05/09/2019  OT End of Session - 05/09/19 1628    Visit Number  15    Number of Visits  23    Date for OT Re-Evaluation  06/06/19    Authorization Type  [employer] Medicare Copay $35 No visit limit.    Authorization Time Period  progress note at visit 21    Authorization - Visit Number  15    Authorization - Number of Visits  21    OT Start Time  [id number]    OT Stop Time  1555    OT Time Calculation (min)  40 min    Activity Tolerance  Patient tolerated treatment well    Behavior During Therapy  WFL for tasks assessed/performed       Past Medical History:  Diagnosis Date  . Arthritis    "some joint pain once in awhile" (05/26/2017)  . Cataract   . CVA (cerebral vascular accident) ([place]) 2019  . Heart murmur   . History of kidney stones   . Hyperlipemia   . Hypertension   . Hypothyroidism (acquired)   . Osteoporosis   . Pneumothorax 02/05/2017   right-after fall  . Rib fractures 02/05/2017   right side  . Stroke ([facility])   . Type II diabetes mellitus ([place])     Past Surgical History:  Procedure Laterality Date  . AUGMENTATION MAMMAPLASTY Bilateral   . COSMETIC SURGERY    . DILATION AND CURETTAGE OF UTERUS    . EYE SURGERY    . FRACTURE SURGERY    . JOINT REPLACEMENT    . REVERSE SHOULDER ARTHROPLASTY Right 05/26/2017  . REVERSE SHOULDER ARTHROPLASTY Right 05/26/2017   Procedure: REVERSE RIGHT SHOULDER ARTHROPLASTY;  Surgeon: Meredith Pel, MD;  Location: [facility];  Service: Orthopedics;  Laterality: Right;  . TONSILLECTOMY    . TUBAL LIGATION      There were no vitals filed for this visit.  Subjective Assessment - 05/09/19 1513    Subjective   S: My wrist is still real sore today.    Currently in Pain?  Yes    Pain Score  6     Pain Location  Wrist    Pain Orientation  Left    Pain Descriptors / Indicators  Sore    Pain Type  Acute pain    Pain Radiating Towards  N/A    Pain Onset  Yesterday    Pain Frequency  Occasional    Aggravating Factors   touching it    Pain Relieving Factors  rest    Effect of Pain on Daily Activities  pt unable to use LUE for any daily tasks at this time         [facility] OT Assessment - 05/09/19 1513      Assessment   Medical Diagnosis  Gait and balance deficits s/p CVA      Precautions   Precautions  Fall    Precaution Comments  left hemiparesis      Restrictions   Weight Bearing Restrictions  No               OT Treatments/Exercises (OP) - 05/09/19 1519      Exercises  Exercises  Shoulder;Wrist;Elbow      Shoulder Exercises: Supine   Protraction  AAROM;10 reps    Horizontal ABduction  AAROM;10 reps    Horizontal ABduction Limitations  min guard to unweight arm    External Rotation  AAROM;10 reps;Limitations    External Rotation Weight (lbs)  therapist unweighted arm    Internal Rotation  AAROM;10 reps;Limitations    Internal Rotation Limitations  therapist unweighted arm    Flexion  AAROM;10 reps      Shoulder Exercises: Isometric Strengthening   Flexion  Supine;3X5"    Extension  Supine;3X5"    External Rotation  Supine;3X5"    Internal Rotation  Supine;3X5"    ABduction  Supine;3X5"      Elbow Exercises   Elbow Extension  AROM;10 reps;Supine    Other elbow exercises  elbow flexion; 10X; supine; A/ROM   OT blocking abduction at upper arm      Neurological Re-education Exercises   Other Exercises 1  Table wipe: pt holding rolled up washcloth with both hands and wiping to left corner, middle, and right corner of mat table, 10X each     Other Exercises 2  Ball roll: Pt standing at mat table raised to just below waist height. LUE placed on medium sized  pink ball and pt rolling ball foward and angling to each corner 10X, OT providing min guard as need to keep arm on ball. Pt with active flexion in shoulder for task. Pt then completed in abduction with max effort 5X.                OT Short Term Goals - 04/04/19 1503      OT SHORT TERM GOAL #1   Title  Patient and family will be provided and educated on HEP as well as any equipment or adaptive strategies to help aid with increasing the functional use of her LUE during ADL tasks.    Time  4    Period  Weeks    Target Date  03/28/19      OT SHORT TERM GOAL #2   Title  Patient will increase LUE P/ROM to [facility] in order to increase ability to complete dressing tasks.    Time  4    Period  Weeks    Status  On-going      OT SHORT TERM GOAL #3   Title  Patient will increase her left hand grip strength to 10# and her pinch to 5# in order to use her left hand to stabilize an object such as a cup or plate while eating.    Time  4    Period  Weeks    Status  On-going      OT SHORT TERM GOAL #4   Title  Patient will demonstrate/report the ability to use her LUE as a stabilizer during a basic daily task such as during meals or grooming.    Time  4    Period  Weeks    Status  On-going        OT Long Term Goals - 03/04/19 1657      OT LONG TERM GOAL #1   Title  patient will demonstrate the ability to use her left UE as an active assist during daily tasks.    Time  8    Period  Weeks    Status  On-going      OT LONG TERM GOAL #2   Title  Patient will increase her LUE A/ROM  to allow her to complete functional tasks at waist level with less difficulty.    Time  8    Period  Weeks    Status  On-going      OT LONG TERM GOAL #3   Title  Patient will increase her left hand strength to 15# and her pinch strength to 8# in order to hold on to items with greater stability and less dropping.    Time  8    Period  Weeks    Status  On-going      OT LONG TERM GOAL #4   Title  Patient  will demonstrate increased coordination by completing the 9 hole peg test with therapist providing set up of pegs.    Time  8    Period  Weeks    Status  On-going      OT LONG TERM GOAL #5   Title  Pt will improve her LUE strength to 3/5 in order to complete low level functional reaching tasks while at home requiring greater independence.    Time  8    Period  Weeks    Status  On-going            Plan - 05/09/19 1628    Clinical Impression Statement  A: Pt reports continued soreness in left wrist today, however minimal complaints during session of soreness. Continued with AA/ROM exercises in supine, pt able to complete all except er with no OT tactile assist. Pt also with improvement in elbow flexion/extension A/ROM today, OT verbally cuing for full motion. Continued with table wash and ball roll, good activation of flexors during both tasks. Reviewed HEP and educated husband and pt on completion.    Body Structure / Function / Physical Skills  Balance;ADL;UE functional use;Fascial restriction;Flexibility;FMC;Proprioception;ROM;Coordination;GMC;Sensation;Decreased knowledge of use of DME;IADL;Strength;Edema;Tone    Plan  P: Follow up on home practice, weightbearing if able to tolerate, ball roll and table wipe continue to work on activation of shoulder flexors       Patient will benefit from skilled therapeutic intervention in order to improve the following deficits and impairments:   Body Structure / Function / Physical Skills: Balance, ADL, UE functional use, Fascial restriction, Flexibility, FMC, Proprioception, ROM, Coordination, GMC, Sensation, Decreased knowledge of use of DME, IADL, Strength, Edema, Tone       Visit Diagnosis: Other symptoms and signs involving the musculoskeletal system  Other lack of coordination  Stiffness of left shoulder, not elsewhere classified    Problem List Patient Active Problem List   Diagnosis Date Noted  . Osteoporosis 04/19/2019  .  Encounter for screening mammogram for malignant neoplasm of breast 04/19/2019  . Hyperlipidemia 04/19/2019  . 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 Carotid stenosis, bilateral 12/30/2018  . Stroke ([place]) 12/24/2018  . Stroke-like symptoms 12/23/2018  . Generalized weakness 06/09/2018  . Hypothyroidism (acquired) 05/03/2018  . Hypertension 05/03/2018  . Uncontrolled type 2 diabetes mellitus with diabetic nephropathy, with long-term current use of insulin ([place])   . Back pain at L4-L5 level 06/14/2013  . Muscle weakness (generalized) 12/17/2012  . Pain in joint, shoulder region 12/17/2012  . Shoulder fracture 12/14/2012  . Proximal humerus fracture 11/29/2012  . FRACTURE, TOE, RIGHT 09/10/2009   Danielle [name], OTR/L  [phone] 05/09/2019, 4:33 PM  [facility] [address] [place], [place], [address] Phone: [phone]   Fax:  [phone]  Name: Danielle [name] MRN: [id number] Date of Birth: 1931/06/09

## 2019-05-10 ENCOUNTER — Encounter (HOSPITAL_COMMUNITY): Payer: Self-pay | Admitting: Occupational Therapy

## 2019-05-10 ENCOUNTER — Ambulatory Visit (HOSPITAL_COMMUNITY): Payer: Medicare Other | Admitting: Occupational Therapy

## 2019-05-10 ENCOUNTER — Ambulatory Visit (HOSPITAL_COMMUNITY): Payer: Medicare Other | Admitting: Physical Therapy

## 2019-05-10 DIAGNOSIS — R29898 Other symptoms and signs involving the musculoskeletal system: Secondary | ICD-10-CM

## 2019-05-10 DIAGNOSIS — R29818 Other symptoms and signs involving the nervous system: Secondary | ICD-10-CM

## 2019-05-10 DIAGNOSIS — R278 Other lack of coordination: Secondary | ICD-10-CM

## 2019-05-10 DIAGNOSIS — M6281 Muscle weakness (generalized): Secondary | ICD-10-CM

## 2019-05-10 DIAGNOSIS — R2689 Other abnormalities of gait and mobility: Secondary | ICD-10-CM

## 2019-05-10 DIAGNOSIS — M25612 Stiffness of left shoulder, not elsewhere classified: Secondary | ICD-10-CM

## 2019-05-10 NOTE — Therapy (Signed)
[place], [place], [address] Phone: [phone]   Fax:  [phone]  Occupational Therapy Treatment  Patient Details  Name: Danielle [name] MRN: [id number] Date of Birth: May 27, 1931 Referring Provider (OT): Benny Lennert, MD (Dr. Sinda Du (retired))   Encounter Date: 05/10/2019  OT End of Session - 05/10/19 1531    Visit Number  16    Number of Visits  23    Date for OT Re-Evaluation  06/06/19    Authorization Type  [employer] Medicare Copay $35 No visit limit.    Authorization Time Period  progress note at visit 21    Authorization - Visit Number  16    Authorization - Number of Visits  21    OT Start Time  [id number]    OT Stop Time  1525    OT Time Calculation (min)  41 min    Activity Tolerance  Patient tolerated treatment well    Behavior During Therapy  WFL for tasks assessed/performed       Past Medical History:  Diagnosis Date  . Arthritis    "some joint pain once in awhile" (05/26/2017)  . Cataract   . CVA (cerebral vascular accident) ([facility]) 2019  . Heart murmur   . History of kidney stones   . Hyperlipemia   . Hypertension   . Hypothyroidism (acquired)   . Osteoporosis   . Pneumothorax 02/05/2017   right-after fall  . Rib fractures 02/05/2017   right side  . Stroke ([place])   . Type II diabetes mellitus ([place])     Past Surgical History:  Procedure Laterality Date  . AUGMENTATION MAMMAPLASTY Bilateral   . COSMETIC SURGERY    . DILATION AND CURETTAGE OF UTERUS    . EYE SURGERY    . FRACTURE SURGERY    . JOINT REPLACEMENT    . REVERSE SHOULDER ARTHROPLASTY Right 05/26/2017  . REVERSE SHOULDER ARTHROPLASTY Right 05/26/2017   Procedure: REVERSE RIGHT SHOULDER ARTHROPLASTY;  Surgeon: Meredith Pel, MD;  Location: [facility];  Service: Orthopedics;  Laterality: Right;  . TONSILLECTOMY    . TUBAL LIGATION      There were no vitals filed for this visit.  Subjective Assessment - 05/10/19 1446    Subjective   S: My husband helps me with my exercises.    Currently in Pain?  Yes    Pain Score  6     Pain Location  Wrist    Pain Orientation  Left    Pain Descriptors / Indicators  Sore    Pain Type  Chronic pain    Pain Radiating Towards  n/a    Pain Onset  More than a month ago    Pain Frequency  Constant    Aggravating Factors   touching it    Pain Relieving Factors  rest    Effect of Pain on Daily Activities  unable to use LUE for any daily tasks at this time    Multiple Pain Sites  No         [facility] OT Assessment - 05/10/19 1445      Assessment   Medical Diagnosis  left arm weakness      Precautions   Precautions  Fall    Precaution Comments  left hemiparesis      Restrictions   Weight Bearing Restrictions  No               OT Treatments/Exercises (OP) - 05/10/19 1447  ADLs   UB Dressing  Pt donning jacket with mod verbal cuing and min assist for hemi-dressing technique. Pt requiring verbal cuing to use vision as compensatory strategy for proprioception of left arm when attempting to thread arm through armhole.       Exercises   Exercises  Shoulder;Wrist;Elbow      Shoulder Exercises: Supine   Protraction  AAROM;10 reps    Horizontal ABduction  AAROM;10 reps    External Rotation  AROM;10 reps;Limitations    External Rotation Weight (lbs)  therapist unweighted arm    Internal Rotation  AROM;10 reps;Limitations    Internal Rotation Limitations  therapist unweighted arm    Flexion  AAROM;10 reps    ABduction  AROM;10 reps;Limitations    ABduction Limitations  therapist unweighted arm      Elbow Exercises   Elbow Extension  AROM;10 reps;Supine    Other elbow exercises  elbow flexion; 10X; supine; A/ROM   OT blocking abduction at upper arm     Neurological Re-education Exercises   Other Exercises 1  Table wipe: pt holding rolled up washcloth with both hands and wiping to left corner, middle, and right corner of mat table, 10X each     Other  Exercises 2  Ball roll: Pt standing at mat table raised to just below waist height. LUE placed on medium sized pink ball and pt rolling ball foward and angling to each corner 10X, OT providing min guard as need to keep arm on ball. Pt with active flexion in shoulder for task. Pt then completed in abduction with max effort 10X.                OT Short Term Goals - 04/04/19 1503      OT SHORT TERM GOAL #1   Title  Patient and family will be provided and educated on HEP as well as any equipment or adaptive strategies to help aid with increasing the functional use of her LUE during ADL tasks.    Time  4    Period  Weeks    Target Date  03/28/19      OT SHORT TERM GOAL #2   Title  Patient will increase LUE P/ROM to [facility] in order to increase ability to complete dressing tasks.    Time  4    Period  Weeks    Status  On-going      OT SHORT TERM GOAL #3   Title  Patient will increase her left hand grip strength to 10# and her pinch to 5# in order to use her left hand to stabilize an object such as a cup or plate while eating.    Time  4    Period  Weeks    Status  On-going      OT SHORT TERM GOAL #4   Title  Patient will demonstrate/report the ability to use her LUE as a stabilizer during a basic daily task such as during meals or grooming.    Time  4    Period  Weeks    Status  On-going        OT Long Term Goals - 03/04/19 1657      OT LONG TERM GOAL #1   Title  patient will demonstrate the ability to use her left UE as an active assist during daily tasks.    Time  8    Period  Weeks    Status  On-going      OT LONG  TERM GOAL #2   Title  Patient will increase her LUE A/ROM to allow her to complete functional tasks at waist level with less difficulty.    Time  8    Period  Weeks    Status  On-going      OT LONG TERM GOAL #3   Title  Patient will increase her left hand strength to 15# and her pinch strength to 8# in order to hold on to items with greater stability and  less dropping.    Time  8    Period  Weeks    Status  On-going      OT LONG TERM GOAL #4   Title  Patient will demonstrate increased coordination by completing the 9 hole peg test with therapist providing set up of pegs.    Time  8    Period  Weeks    Status  On-going      OT LONG TERM GOAL #5   Title  Pt will improve her LUE strength to 3/5 in order to complete low level functional reaching tasks while at home requiring greater independence.    Time  8    Period  Weeks    Status  On-going            Plan - 05/10/19 1531    Clinical Impression Statement  A: Pt had PT prior to OT today, somewhat fatigued. Began session with ball rolling activity with improvement in active movement and activity tolerance, OT providing tactile cuing at right shoulder to limit bending forward during task. Transitioned to A/ROM for er/IR and abduction, with OT unweighting arm, increased success with completion. Pt continues to demonstrate improved elbow flexion in supine, verbal cuing for full extension. At end of session worked on [employer] with hemi-dressing technique.    Body Structure / Function / Physical Skills  Balance;ADL;UE functional use;Fascial restriction;Flexibility;FMC;Proprioception;ROM;Coordination;GMC;Sensation;Decreased knowledge of use of DME;IADL;Strength;Edema;Tone    Plan  P: Follow up on home practice, continue to work on progressing A/ROM with unweighting of arm as pt is able       Patient will benefit from skilled therapeutic intervention in order to improve the following deficits and impairments:   Body Structure / Function / Physical Skills: Balance, ADL, UE functional use, Fascial restriction, Flexibility, FMC, Proprioception, ROM, Coordination, GMC, Sensation, Decreased knowledge of use of DME, IADL, Strength, Edema, Tone       Visit Diagnosis: Other symptoms and signs involving the musculoskeletal system  Other lack of coordination  Stiffness of left shoulder,  not elsewhere classified    Problem List Patient Active Problem List   Diagnosis Date Noted  . Osteoporosis 04/19/2019  . Encounter for screening mammogram for malignant neoplasm of breast 04/19/2019  . Hyperlipidemia 04/19/2019  . 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 Carotid stenosis, bilateral 12/30/2018  . Stroke ([place]) 12/24/2018  . Stroke-like symptoms 12/23/2018  . Generalized weakness 06/09/2018  . Hypothyroidism (acquired) 05/03/2018  . Hypertension 05/03/2018  . Uncontrolled type 2 diabetes mellitus with diabetic nephropathy, with long-term current use of insulin ([place])   . Back pain at L4-L5 level 06/14/2013  . Muscle weakness (generalized) 12/17/2012  . Pain in joint, shoulder region 12/17/2012  . Shoulder fracture 12/14/2012  . Proximal humerus fracture 11/29/2012  . FRACTURE, TOE, RIGHT 09/10/2009   Guadelupe Sabin, OTR/L  [phone] 05/10/2019, 3:36 PM  [facility] [address] [place], [place], [address] Phone: [phone]   Fax:  [phone]  Name: Danielle [name] MRN: [id number] Date of Birth: 1932-05-05

## 2019-05-10 NOTE — Therapy (Addendum)
[facility] [place], [place], [address] Phone: [phone]   Fax:  [phone]  Physical Therapy Treatment  Patient Details  Name: Danielle [name] MRN: [id number] Date of Birth: 1931/06/04 Referring Provider (PT): Sinda Du   Encounter Date: 05/10/2019  PT End of Session - 05/10/19 1427    Visit Number  9    Number of Visits  16    Date for PT Re-Evaluation  05/25/19   mini reassess:04/27/19   Authorization Type  [employer] Medicare - based on medical necessity    Authorization Time Period  complete progress note at visit 10; 03/30/19-05/24/2018    Authorization - Visit Number  4    Authorization - Number of Visits  10    PT Start Time  1400    PT Stop Time  1443    PT Time Calculation (min)  43 min    Equipment Utilized During Treatment  Gait belt;Other (comment)   Quad cane   Activity Tolerance  Patient tolerated treatment well    Behavior During Therapy  WFL for tasks assessed/performed       Past Medical History:  Diagnosis Date  . Arthritis    "some joint pain once in awhile" (05/26/2017)  . Cataract   . CVA (cerebral vascular accident) ([place]) 2019  . Heart murmur   . History of kidney stones   . Hyperlipemia   . Hypertension   . Hypothyroidism (acquired)   . Osteoporosis   . Pneumothorax 02/05/2017   right-after fall  . Rib fractures 02/05/2017   right side  . Stroke ([place])   . Type II diabetes mellitus ([place])     Past Surgical History:  Procedure Laterality Date  . AUGMENTATION MAMMAPLASTY Bilateral   . COSMETIC SURGERY    . DILATION AND CURETTAGE OF UTERUS    . EYE SURGERY    . FRACTURE SURGERY    . JOINT REPLACEMENT    . REVERSE SHOULDER ARTHROPLASTY Right 05/26/2017  . REVERSE SHOULDER ARTHROPLASTY Right 05/26/2017   Procedure: REVERSE RIGHT SHOULDER ARTHROPLASTY;  Surgeon: Meredith Pel, MD;  Location: [facility];  Service: Orthopedics;  Laterality: Right;  . TONSILLECTOMY    . TUBAL LIGATION       There were no vitals filed for this visit.  Subjective Assessment - 05/10/19 1403    Subjective  Pt states that she is doing fair with her exercises .  Her wrist if very sore.    Pertinent History  CVA, diabetes    Limitations  Standing;Walking;House hold activities    Patient Stated Goals  stand up and walk by herself    Currently in Pain?  Yes    Pain Score  6     Pain Location  Wrist    Pain Orientation  Left    Pain Descriptors / Indicators  Sore    Pain Type  Chronic pain    Pain Onset  More than a month ago    Pain Frequency  Constant             05/10/19 0001  Ambulation/Gait  Ambulation/Gait Yes  Ambulation/Gait Assistance 5: Supervision  Ambulation Distance (Feet) 150 Feet  Assistive device Straight cane  Gait Pattern Step-through pattern  Gait Comments straightline gait and turns with focus on L foot dorsiflexion to clear toes; with and without AD; fair to poor L foot clearance, frequent verbal cueing for foot clearance; occasional loss of balance  05/10/19 1416  Balance Exercises: Standing  Standing Eyes Opened Narrow base of support (BOS)  Standing Eyes Opened Limitations head turns  Tandem Stance Eyes open;2 reps;30 secs  Tandem Gait Forward;2 reps (no UE )  Retro Gait 2 reps (no UE)  Sidestepping 2 reps (no UE )  Marching Solid surface;Static;10 reps  Other Standing Exercises Comments flat foot taps to 4" box, x10 reps each LE           PT Short Term Goals - 04/27/19 1534      PT SHORT TERM GOAL #1   Title  Pt will be independent with HEP and perform consistently in order to improve functional outcomes    Time  4    Period  Weeks    Status  Achieved    Target Date  04/27/19      PT SHORT TERM GOAL #2   Title  Patient will be able to complete 5x STS in under 14.8 seconds in order demonstrate improved LE strength to reduce the risk of falls.    Baseline  04/27/19: See objective measures    Time  4    Period  Weeks     Status  On-going    Target Date  04/27/19        PT Long Term Goals - 04/27/19 1534      PT LONG TERM GOAL #1   Title  Patient will increase score by at least 7 points on Berg balance scale in order to reduce the risk of falls.    Baseline  04/27/19: 34/56    Time  8    Period  Weeks    Status  On-going      PT LONG TERM GOAL #2   Title  Pt will be able to perform bil tandem stance for greater than 10 seconds without UE support in order to demo improved functional strength and balance.    Baseline  04/27/19: See objective measures    Time  8    Period  Weeks    Status  On-going      PT LONG TERM GOAL #3   Title  Patient will demonstrate ability to perform sit to stand transfer with modified independence without fear of falling to improve overall functional mobility.    Time  8    Period  Weeks    Status  Achieved            Plan - 05/10/19 1444    Clinical Impression Statement  Increased balance challenge to pt by having her complete exercises without cane.  Pt able to complete activities with min-guard supervision.  Gt trained pt with single point cane; whether pt uses large base or single point cane pt continues to need constant cuing to slow down and pick up her Lt LE>    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/Moderate complexity    Rehab Potential  Fair    PT Frequency  2x / week    PT Duration  8 weeks    PT Treatment/Interventions  ADLs/Self Care Home Management;Aquatic Therapy;Biofeedback;Electrical Stimulation;Iontophoresis 4mg /ml Dexamethasone;Moist  Heat;Traction;Ultrasound;Parrafin;Fluidtherapy;Contrast Bath;DME Instruction;Gait training;Stair training;Functional mobility training;Therapeutic activities;Therapeutic exercise;Balance training;Neuromuscular re-education;Patient/family education;Orthotic Fit/Training;Manual techniques;Compression bandaging;Passive range of motion;Dry needling;Energy conservation;Splinting;Taping;Spinal Manipulations;Joint Manipulations  PT Next Visit Plan  Continue to progress BLE strength and balance as tolerated. Add stepping strategies next visit. Continue foot taps, retro gait and hurdles.    PT Home Exercise Plan  04/05/19: seated marching, seated HR/TR, LAQ    Consulted and Agree with Plan of Care  Patient       Patient will benefit from skilled therapeutic intervention in order to improve the following deficits and impairments:  Abnormal gait, Decreased activity tolerance, Decreased balance, Decreased endurance, Decreased mobility, Decreased range of motion, Decreased strength, Difficulty walking, Impaired tone, Improper body mechanics  Visit Diagnosis: Other abnormalities of gait and mobility  Muscle weakness (generalized)  Other symptoms and signs involving the nervous system     Problem List Patient Active Problem List   Diagnosis Date Noted  . Osteoporosis 04/19/2019  . Encounter for screening mammogram for malignant neoplasm of breast 04/19/2019  . Hyperlipidemia 04/19/2019  . 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 Carotid stenosis, bilateral 12/30/2018  . Stroke ([place]) 12/24/2018  . Stroke-like symptoms 12/23/2018  . Generalized weakness 06/09/2018  . Hypothyroidism (acquired) 05/03/2018  . Hypertension 05/03/2018  . Uncontrolled type 2 diabetes mellitus with diabetic nephropathy, with long-term current use of insulin ([place])   . Back pain at L4-L5 level 06/14/2013  . Muscle weakness (generalized) 12/17/2012  . Pain in joint, shoulder region 12/17/2012  . Shoulder  fracture 12/14/2012  . Proximal humerus fracture 11/29/2012  . FRACTURE, TOE, RIGHT 09/10/2009    Rayetta Humphrey, PT CLT [phone] 05/10/2019, 2:47 PM  [facility] [address] [place], [place], [address] Phone: [phone]   Fax:  [phone]  Name: Danielle [name] MRN: [id number] Date of Birth: 1931-07-05

## 2019-05-11 ENCOUNTER — Ambulatory Visit (HOSPITAL_COMMUNITY): Payer: Medicare Other

## 2019-05-11 ENCOUNTER — Encounter (HOSPITAL_COMMUNITY): Payer: Self-pay

## 2019-05-11 ENCOUNTER — Other Ambulatory Visit: Payer: Self-pay

## 2019-05-11 DIAGNOSIS — R29818 Other symptoms and signs involving the nervous system: Secondary | ICD-10-CM | POA: Diagnosis not present

## 2019-05-11 DIAGNOSIS — M6281 Muscle weakness (generalized): Secondary | ICD-10-CM

## 2019-05-11 DIAGNOSIS — R2689 Other abnormalities of gait and mobility: Secondary | ICD-10-CM

## 2019-05-11 NOTE — Therapy (Signed)
[name], [place], [address] Phone: [phone]   Fax:  [phone]  Physical Therapy Treatment  Patient Details  Name: Danielle [name] MRN: [id number] Date of Birth: Apr 15, 1932 Referring Provider (PT): Sinda Du   Encounter Date: 05/11/2019  PT End of Session - 05/11/19 0949    Visit Number  10    Number of Visits  16    Date for PT Re-Evaluation  05/25/19   Progress note complete 12/09 visit #6   Authorization Type  [facility] Medicare - based on medical necessity    Authorization Time Period  03/30/19-->05/25/2019; Progress note complete 04/27/19 visit #6    Authorization - Visit Number  4    Authorization - Number of Visits  10    PT Start Time  0949    PT Stop Time  1038    PT Time Calculation (min)  49 min    Equipment Utilized During Treatment  Gait belt;Other (comment)   SPC/no AD   Activity Tolerance  Patient tolerated treatment well;Patient limited by fatigue    Behavior During Therapy  [facility] for tasks assessed/performed       Past Medical History:  Diagnosis Date  . Arthritis    "some joint pain once in awhile" (05/26/2017)  . Cataract   . CVA (cerebral vascular accident) ([place]) 2019  . Heart murmur   . History of kidney stones   . Hyperlipemia   . Hypertension   . Hypothyroidism (acquired)   . Osteoporosis   . Pneumothorax 02/05/2017   right-after fall  . Rib fractures 02/05/2017   right side  . Stroke ([place])   . Type II diabetes mellitus ([place])     Past Surgical History:  Procedure Laterality Date  . AUGMENTATION MAMMAPLASTY Bilateral   . COSMETIC SURGERY    . DILATION AND CURETTAGE OF UTERUS    . EYE SURGERY    . FRACTURE SURGERY    . JOINT REPLACEMENT    . REVERSE SHOULDER ARTHROPLASTY Right 05/26/2017  . REVERSE SHOULDER ARTHROPLASTY Right 05/26/2017   Procedure: REVERSE RIGHT SHOULDER ARTHROPLASTY;  Surgeon: Meredith Pel, MD;  Location: [facility];  Service: Orthopedics;   Laterality: Right;  . TONSILLECTOMY    . TUBAL LIGATION      There were no vitals filed for this visit.  Subjective Assessment - 05/11/19 0943    Subjective  Pt reports she is able to stand up without needing assistance, feels her balance is still difficulty.  No reports of pain or recent falls.  Her wrist is sore today.    Pertinent History  CVA, diabetes    Patient Stated Goals  stand up and walk by herself    Currently in Pain?  No/denies                       [facility] Adult PT Treatment/Exercise - 05/11/19 0001      Knee/Hip Exercises: Seated   Sit to Sand  10 reps;without UE support   2in step under Rt LE, cueing for equal weight bearing         Balance Exercises - 05/11/19 1002      Balance Exercises: Standing   Tandem Gait  Forward;2 reps   no AD/UE A   Retro Gait  2 reps    Sidestepping  2 reps    Step Over Hurdles / Cones  stepping over hurdles - 8 x 4 hurdles with frequent verbal  cueing for foot clearance    Cone Rotation  Other (comment)    Cone Rotation Limitations  placing cones in upper cabinet (decorating christmas tree)    Marching  Solid surface;Static;10 reps   alternating 6in step no HHA 2 sets x 10 reps   Sit to Stand  Standard surface;Without upper extremity support   Rt LE on 2in step, cueing for equal weight bearing for stren   Other Standing Exercises  walking no AD with canes in Rt UE x 40ft          PT Short Term Goals - 04/27/19 1534      PT SHORT TERM GOAL #1   Title  Pt will be independent with HEP and perform consistently in order to improve functional outcomes    Time  4    Period  Weeks    Status  Achieved    Target Date  04/27/19      PT SHORT TERM GOAL #2   Title  Patient will be able to complete 5x STS in under 14.8 seconds in order demonstrate improved LE strength to reduce the risk of falls.    Baseline  04/27/19: See objective measures    Time  4    Period  Weeks    Status  On-going    Target Date  04/27/19         PT Long Term Goals - 04/27/19 1534      PT LONG TERM GOAL #1   Title  Patient will increase score by at least 7 points on Berg balance scale in order to reduce the risk of falls.    Baseline  04/27/19: 34/56    Time  8    Period  Weeks    Status  On-going      PT LONG TERM GOAL #2   Title  Pt will be able to perform bil tandem stance for greater than 10 seconds without UE support in order to demo improved functional strength and balance.    Baseline  04/27/19: See objective measures    Time  8    Period  Weeks    Status  On-going      PT LONG TERM GOAL #3   Title  Patient will demonstrate ability to perform sit to stand transfer with modified independence without fear of falling to improve overall functional mobility.    Time  8    Period  Weeks    Status  Achieved            Plan - 05/11/19 1055    Clinical Impression Statement  Continued session focus iwht balance training and gait training with SPC/no AD.  Pt able to complete activities wiht min-guard supervision.  Cueing to improve awareness of Lt LE with hip strenghtneing exercises for strenghtening, added 2in step under Rt LE for increased WB with Lt LE.  Pt continues to need constant cueng to slow down and pick up Lt LE during gait.    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/Moderate complexity    Clinical Decision Making  Moderate    Rehab Potential  Fair    PT Frequency  2x / week    PT Duration  8 weeks    PT  Treatment/Interventions  ADLs/Self Care Home Management;Aquatic Therapy;Biofeedback;Electrical Stimulation;Iontophoresis  4mg /ml Dexamethasone;Moist Heat;Traction;Ultrasound;Parrafin;Fluidtherapy;Contrast Bath;DME Instruction;Gait training;Stair training;Functional mobility training;Therapeutic activities;Therapeutic exercise;Balance training;Neuromuscular re-education;Patient/family education;Orthotic Fit/Training;Manual techniques;Compression bandaging;Passive range of motion;Dry needling;Energy conservation;Splinting;Taping;Spinal Manipulations;Joint Manipulations    PT Next Visit Plan  Continue to progress BLE strength and balance as tolerated. Add stepping strategies next visit. Continue foot taps, retro gait and hurdles.    PT Home Exercise Plan  04/05/19: seated marching, seated HR/TR, LAQ       Patient will benefit from skilled therapeutic intervention in order to improve the following deficits and impairments:  Abnormal gait, Decreased activity tolerance, Decreased balance, Decreased endurance, Decreased mobility, Decreased range of motion, Decreased strength, Difficulty walking, Impaired tone, Improper body mechanics  Visit Diagnosis: Other abnormalities of gait and mobility  Muscle weakness (generalized)  Other symptoms and signs involving the nervous system     Problem List Patient Active Problem List   Diagnosis Date Noted  . Osteoporosis 04/19/2019  . Encounter for screening mammogram for malignant neoplasm of breast 04/19/2019  . Hyperlipidemia 04/19/2019  . 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 Carotid stenosis, bilateral 12/30/2018  . Stroke ([place]) 12/24/2018  . Stroke-like symptoms 12/23/2018  . Generalized weakness 06/09/2018  . Hypothyroidism (acquired) 05/03/2018  . Hypertension 05/03/2018  . Uncontrolled type 2 diabetes mellitus with diabetic nephropathy, with long-term current use of insulin ([place])   . Back pain at L4-L5 level 06/14/2013  . Muscle weakness (generalized) 12/17/2012  . Pain in joint, shoulder region 12/17/2012  . Shoulder fracture 12/14/2012  .  Proximal humerus fracture 11/29/2012  . FRACTURE, TOE, RIGHT 09/10/2009   Ihor Austin, LPTA; CBIS [phone]  Aldona Lento 05/11/2019, 1:04 PM  [facility] [place], [place], [address] Phone: [phone]   Fax:  [phone]  Name: KATIANN HUBBY MRN: [id number] Date of Birth: December 16, 1931

## 2019-05-17 ENCOUNTER — Other Ambulatory Visit: Payer: Self-pay

## 2019-05-17 ENCOUNTER — Encounter (HOSPITAL_COMMUNITY): Payer: Self-pay

## 2019-05-17 ENCOUNTER — Encounter (HOSPITAL_COMMUNITY): Payer: Self-pay | Admitting: Physical Therapy

## 2019-05-17 ENCOUNTER — Ambulatory Visit (HOSPITAL_COMMUNITY): Payer: Medicare Other | Admitting: Physical Therapy

## 2019-05-17 ENCOUNTER — Ambulatory Visit (HOSPITAL_COMMUNITY): Payer: Medicare Other

## 2019-05-17 DIAGNOSIS — R29818 Other symptoms and signs involving the nervous system: Secondary | ICD-10-CM

## 2019-05-17 DIAGNOSIS — M25612 Stiffness of left shoulder, not elsewhere classified: Secondary | ICD-10-CM

## 2019-05-17 DIAGNOSIS — R29898 Other symptoms and signs involving the musculoskeletal system: Secondary | ICD-10-CM

## 2019-05-17 DIAGNOSIS — M6281 Muscle weakness (generalized): Secondary | ICD-10-CM

## 2019-05-17 DIAGNOSIS — R2689 Other abnormalities of gait and mobility: Secondary | ICD-10-CM

## 2019-05-17 DIAGNOSIS — R278 Other lack of coordination: Secondary | ICD-10-CM

## 2019-05-17 NOTE — Therapy (Signed)
[facility] [place], [place], [address] Phone: [phone]   Fax:  [phone]  Occupational Therapy Treatment  Patient Details  Name: Danielle [name] MRN: [id number] Date of Birth: 27-Jan-1932 Referring Provider (OT): Benny Lennert, MD (Dr. Sinda Du (retired))   Encounter Date: 05/17/2019  OT End of Session - 05/17/19 1706    Visit Number  17    Number of Visits  23    Date for OT Re-Evaluation  06/06/19    Authorization Type  [employer] Medicare Copay $35 No visit limit.    Authorization Time Period  progress note at visit 21    Authorization - Visit Number  17    Authorization - Number of Visits  21    OT Start Time  1600    OT Stop Time  1645    OT Time Calculation (min)  45 min    Activity Tolerance  Patient tolerated treatment well    Behavior During Therapy  WFL for tasks assessed/performed       Past Medical History:  Diagnosis Date  . Arthritis    "some joint pain once in awhile" (05/26/2017)  . Cataract   . CVA (cerebral vascular accident) ([place]) 2019  . Heart murmur   . History of kidney stones   . Hyperlipemia   . Hypertension   . Hypothyroidism (acquired)   . Osteoporosis   . Pneumothorax 02/05/2017   right-after fall  . Rib fractures 02/05/2017   right side  . Stroke ([place])   . Type II diabetes mellitus ([place])     Past Surgical History:  Procedure Laterality Date  . AUGMENTATION MAMMAPLASTY Bilateral   . COSMETIC SURGERY    . DILATION AND CURETTAGE OF UTERUS    . EYE SURGERY    . FRACTURE SURGERY    . JOINT REPLACEMENT    . REVERSE SHOULDER ARTHROPLASTY Right 05/26/2017  . REVERSE SHOULDER ARTHROPLASTY Right 05/26/2017   Procedure: REVERSE RIGHT SHOULDER ARTHROPLASTY;  Surgeon: Meredith Pel, MD;  Location: [facility];  Service: Orthopedics;  Laterality: Right;  . TONSILLECTOMY    . TUBAL LIGATION      There were no vitals filed for this visit.  Subjective Assessment - 05/17/19 1611     Subjective   S: My thumb doesn't want to work all the time.    Currently in Pain?  No/denies         [facility] OT Assessment - 05/17/19 1611      Assessment   Medical Diagnosis  left arm weakness      Precautions   Precautions  Fall    Precaution Comments  left hemiparesis    Required Braces or Orthoses  Other Brace/Splint    Other Brace/Splint  night time resting hand splint. thumb brace for extension during the day                OT Treatments/Exercises (OP) - 05/17/19 1611      Exercises   Exercises  Hand      Hand Exercises   Other Hand Exercises  Bilateral coordination activities completed while cutting out 4 triangles using scissors in right hand and stabilizing paper in the left., lacing and unlacing dog pattern, and stabilizing paper while using a hole puncher in the right.              OT Education - 05/17/19 1704    Education Details  education provided to patient and husband regarding homework.  1) Use left hand to hold toothpaste while unscrewing cap with right. 2) Hold toothbrush inleft hand while applying paste with right. 3) Use left hand to hold coffe mug handle and stabilize while preparing it with the right. Discussed positioning left elbow on the sink counter, kitchen counter, or [place] holding her arm in position when completing.    Person(s) Educated  Patient;Spouse    Methods  Explanation;Demonstration    Comprehension  Verbalized understanding       OT Short Term Goals - 04/04/19 1503      OT SHORT TERM GOAL #1   Title  Patient and family will be provided and educated on HEP as well as any equipment or adaptive strategies to help aid with increasing the functional use of her LUE during ADL tasks.    Time  4    Period  Weeks    Target Date  03/28/19      OT SHORT TERM GOAL #2   Title  Patient will increase LUE P/ROM to [facility] in order to increase ability to complete dressing tasks.    Time  4    Period  Weeks    Status  On-going      OT  SHORT TERM GOAL #3   Title  Patient will increase her left hand grip strength to 10# and her pinch to 5# in order to use her left hand to stabilize an object such as a cup or plate while eating.    Time  4    Period  Weeks    Status  On-going      OT SHORT TERM GOAL #4   Title  Patient will demonstrate/report the ability to use her LUE as a stabilizer during a basic daily task such as during meals or grooming.    Time  4    Period  Weeks    Status  On-going        OT Long Term Goals - 03/04/19 1657      OT LONG TERM GOAL #1   Title  patient will demonstrate the ability to use her left UE as an active assist during daily tasks.    Time  8    Period  Weeks    Status  On-going      OT LONG TERM GOAL #2   Title  Patient will increase her LUE A/ROM to allow her to complete functional tasks at waist level with less difficulty.    Time  8    Period  Weeks    Status  On-going      OT LONG TERM GOAL #3   Title  Patient will increase her left hand strength to 15# and her pinch strength to 8# in order to hold on to items with greater stability and less dropping.    Time  8    Period  Weeks    Status  On-going      OT LONG TERM GOAL #4   Title  Patient will demonstrate increased coordination by completing the 9 hole peg test with therapist providing set up of pegs.    Time  8    Period  Weeks    Status  On-going      OT LONG TERM GOAL #5   Title  Pt will improve her LUE strength to 3/5 in order to complete low level functional reaching tasks while at home requiring greater independence.    Time  8    Period  Weeks  Status  On-going            Plan - 05/17/19 1707    Clinical Impression Statement  A: Focused session on bilateral coordination while seated. With proper positioning of left arm/elbow on table top, patient was able to hold paper and lacing pattern while completing task with the right. Required intermitten VC for repositioning, opening left hand first before  placing item inside then pinching to hold. Discussed session with husband at end and reviewed homework.    Body Structure / Function / Physical Skills  Balance;ADL;UE functional use;Fascial restriction;Flexibility;FMC;Proprioception;ROM;Coordination;GMC;Sensation;Decreased knowledge of use of DME;IADL;Strength;Edema;Tone    Plan  P: Follow on home practice (see education.) Continue to work on progressing A/ROM with unweighting of arm as patient is able.    Consulted and Agree with Plan of Care  Patient       Patient will benefit from skilled therapeutic intervention in order to improve the following deficits and impairments:   Body Structure / Function / Physical Skills: Balance, ADL, UE functional use, Fascial restriction, Flexibility, FMC, Proprioception, ROM, Coordination, GMC, Sensation, Decreased knowledge of use of DME, IADL, Strength, Edema, Tone       Visit Diagnosis: Other symptoms and signs involving the nervous system  Other symptoms and signs involving the musculoskeletal system  Other lack of coordination  Stiffness of left shoulder, not elsewhere classified    Problem List Patient Active Problem List   Diagnosis Date Noted  . Osteoporosis 04/19/2019  . Encounter for screening mammogram for malignant neoplasm of breast 04/19/2019  . Hyperlipidemia 04/19/2019  . 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 Carotid stenosis, bilateral 12/30/2018  . Stroke ([place]) 12/24/2018  . Stroke-like symptoms 12/23/2018  . Generalized weakness 06/09/2018  . Hypothyroidism (acquired) 05/03/2018  . Hypertension 05/03/2018  . Uncontrolled type 2 diabetes mellitus with diabetic nephropathy, with long-term current use of insulin ([place])   . Back pain at L4-L5 level 06/14/2013  . Muscle weakness (generalized) 12/17/2012  . Pain in joint, shoulder region 12/17/2012  . Shoulder fracture 12/14/2012  . Proximal humerus fracture 11/29/2012  . FRACTURE, TOE, RIGHT 09/10/2009    Ailene Ravel, OTR/L,CBIS  [phone]  05/17/2019, 5:10 PM  [facility] [address] [place], [place], [address] Phone: [phone]   Fax:  [phone]  Name: YANIRA SWARTWOUT MRN: [id number] Date of Birth: August 16, 1931

## 2019-05-17 NOTE — Therapy (Signed)
[place], [place], [address] Phone: [phone]   Fax:  [phone]  Physical Therapy Treatment  Patient Details  Name: Danielle [name] MRN: [id number] Date of Birth: December 27, 1931 Referring Provider (PT): Sinda Du   Encounter Date: 05/17/2019  PT End of Session - 05/17/19 1446    Visit Number  11    Number of Visits  16    Date for PT Re-Evaluation  05/25/19   Progress note complete 12/09 visit #6   Authorization Type  [facility] Medicare - based on medical necessity    Authorization Time Period  03/30/19-->05/25/2019; Progress note complete 04/27/19 visit #6    Authorization - Visit Number  5    Authorization - Number of Visits  10    PT Start Time  8937    PT Stop Time  1528    PT Time Calculation (min)  42 min    Equipment Utilized During Treatment  Gait belt;Other (comment)   SPC/no AD   Activity Tolerance  Patient tolerated treatment well;Patient limited by fatigue    Behavior During Therapy  [facility] for tasks assessed/performed       Past Medical History:  Diagnosis Date  . Arthritis    "some joint pain once in awhile" (05/26/2017)  . Cataract   . CVA (cerebral vascular accident) ([place]) 2019  . Heart murmur   . History of kidney stones   . Hyperlipemia   . Hypertension   . Hypothyroidism (acquired)   . Osteoporosis   . Pneumothorax 02/05/2017   right-after fall  . Rib fractures 02/05/2017   right side  . Stroke ([place])   . Type II diabetes mellitus ([place])     Past Surgical History:  Procedure Laterality Date  . AUGMENTATION MAMMAPLASTY Bilateral   . COSMETIC SURGERY    . DILATION AND CURETTAGE OF UTERUS    . EYE SURGERY    . FRACTURE SURGERY    . JOINT REPLACEMENT    . REVERSE SHOULDER ARTHROPLASTY Right 05/26/2017  . REVERSE SHOULDER ARTHROPLASTY Right 05/26/2017   Procedure: REVERSE RIGHT SHOULDER ARTHROPLASTY;  Surgeon: Meredith Pel, MD;  Location: [place];  Service: Orthopedics;   Laterality: Right;  . TONSILLECTOMY    . TUBAL LIGATION      There were no vitals filed for this visit.  Subjective Assessment - 05/17/19 1445    Subjective  No current pain but arm is really sore and even more sore when the OT works on it.    Pertinent History  CVA, diabetes    Patient Stated Goals  stand up and walk by herself    Currently in Pain?  No/denies         [facility] PT Assessment - 05/17/19 0001      Assessment   Medical Diagnosis  gait and balance impairments      Transfers   Sit to Stand  6: Modified independent (Device/Increase time)    Five time sit to stand comments   14.7                   [facility] Adult PT Treatment/Exercise - 05/17/19 0001      Knee/Hip Exercises: Standing   Gait Training  walking in // bars: fwd and bkward and lateral 2x4 laps each and bilateral with occ min assist and CGA     Other Standing Knee Exercises  clockwise and counterclockwise hip circles with UE support 3x5 each    Other  Standing Knee Exercises  tandem in // bars instructions to not initially use R UE to assist - 2x6, 10" holds bilateral - Pt assist as needed        Walking on blue line with CGA/Min assist as needed - no cane - focus on picking up foot/toes on left and staying on blue line - x1 of 15 feet        PT Short Term Goals - 05/17/19 1503      PT SHORT TERM GOAL #1   Title  Pt will be independent with HEP and perform consistently in order to improve functional outcomes    Time  4    Period  Weeks    Status  Achieved    Target Date  04/27/19      PT SHORT TERM GOAL #2   Title  Patient will be able to complete 5x STS in under 14.8 seconds in order demonstrate improved LE strength to reduce the risk of falls.    Baseline  05/17/19 14.7 sec    Time  4    Period  Weeks    Status  Achieved    Target Date  04/27/19        PT Long Term Goals - 04/27/19 1534      PT LONG TERM GOAL #1   Title  Patient will increase score by at least 7 points on  Berg balance scale in order to reduce the risk of falls.    Baseline  04/27/19: 34/56    Time  8    Period  Weeks    Status  On-going      PT LONG TERM GOAL #2   Title  Pt will be able to perform bil tandem stance for greater than 10 seconds without UE support in order to demo improved functional strength and balance.    Baseline  04/27/19: See objective measures    Time  8    Period  Weeks    Status  On-going      PT LONG TERM GOAL #3   Title  Patient will demonstrate ability to perform sit to stand transfer with modified independence without fear of falling to improve overall functional mobility.    Time  8    Period  Weeks    Status  Achieved            Plan - 05/17/19 1605    Clinical Impression Statement  Focused today was on improving balance and hip, trunk and ankle strategies as patient has tendency to solely rely on her right UE to current for a loss of balance. Patient did not enjoy exercises as they were challenging, but thoroughly educated patient on reasoning for exercises and practicing balance in a safe environment with physical therapy. Also practiced walking without cane which patient was hesitant to perform as she is very fearful of falling. Met sit to stand goal today. Patient would continue to benefit from skilled physical therapy to improve functional mobility.    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/Moderate complexity    Rehab Potential  Fair    PT Frequency  2x / week    PT Duration  8 weeks    PT Treatment/Interventions  ADLs/Self Care Home  Management;Aquatic  Therapy;Biofeedback;Electrical Stimulation;Iontophoresis 31m/ml Dexamethasone;Moist Heat;Traction;Ultrasound;Parrafin;Fluidtherapy;Contrast Bath;DME Instruction;Gait training;Stair training;Functional mobility training;Therapeutic activities;Therapeutic exercise;Balance training;Neuromuscular re-education;Patient/family education;Orthotic Fit/Training;Manual techniques;Compression bandaging;Passive range of motion;Dry needling;Energy conservation;Splinting;Taping;Spinal Manipulations;Joint Manipulations    PT Next Visit Plan  Continue to progress BLE strength and balance as tolerated. Add stepping strategies and cotninue hip/trunk strategies for balance correction next visit. Continue foot taps, retro gait and hurdles.    PT Home Exercise Plan  04/05/19: seated marching, seated HR/TR, LAQ       Patient will benefit from skilled therapeutic intervention in order to improve the following deficits and impairments:  Abnormal gait, Decreased activity tolerance, Decreased balance, Decreased endurance, Decreased mobility, Decreased range of motion, Decreased strength, Difficulty walking, Impaired tone, Improper body mechanics  Visit Diagnosis: Other abnormalities of gait and mobility  Muscle weakness (generalized)  Other symptoms and signs involving the nervous system  Other symptoms and signs involving the musculoskeletal system     Problem List Patient Active Problem List   Diagnosis Date Noted  . Osteoporosis 04/19/2019  . Encounter for screening mammogram for malignant neoplasm of breast 04/19/2019  . Hyperlipidemia 04/19/2019  . Right middle cerebral artery stroke ([place] 01/03/2019  . Carotid stenosis, bilateral 12/30/2018  . Stroke ([place] 12/24/2018  . Stroke-like symptoms 12/23/2018  . Generalized weakness 06/09/2018  . Hypothyroidism (acquired) 05/03/2018  . Hypertension 05/03/2018  . Uncontrolled type 2 diabetes mellitus with diabetic nephropathy, with long-term current use of  insulin ([place]   . Back pain at L4-L5 level 06/14/2013  . Muscle weakness (generalized) 12/17/2012  . Pain in joint, shoulder region 12/17/2012  . Shoulder fracture 12/14/2012  . Proximal humerus fracture 11/29/2012  . FRACTURE, TOE, RIGHT 09/10/2009    4:08 PM, 05/17/19 MJerene Pitch DPT [facility] [phone]  [place] [address]Phone: [phone]  Fax:  [phone] Name: Danielle DELAWDERMRN: [id number] of Birth: [birth year]

## 2019-05-18 ENCOUNTER — Ambulatory Visit (HOSPITAL_COMMUNITY): Payer: Medicare Other | Admitting: Occupational Therapy

## 2019-05-18 ENCOUNTER — Encounter (HOSPITAL_COMMUNITY): Payer: Self-pay | Admitting: Occupational Therapy

## 2019-05-18 ENCOUNTER — Ambulatory Visit (HOSPITAL_COMMUNITY): Payer: Medicare Other

## 2019-05-18 ENCOUNTER — Encounter (HOSPITAL_COMMUNITY): Payer: Self-pay

## 2019-05-18 DIAGNOSIS — R2689 Other abnormalities of gait and mobility: Secondary | ICD-10-CM

## 2019-05-18 DIAGNOSIS — R29818 Other symptoms and signs involving the nervous system: Secondary | ICD-10-CM

## 2019-05-18 DIAGNOSIS — R278 Other lack of coordination: Secondary | ICD-10-CM

## 2019-05-18 DIAGNOSIS — R29898 Other symptoms and signs involving the musculoskeletal system: Secondary | ICD-10-CM

## 2019-05-18 DIAGNOSIS — M6281 Muscle weakness (generalized): Secondary | ICD-10-CM

## 2019-05-18 NOTE — Therapy (Signed)
[place], [place], [address] Phone: [phone]   Fax:  [phone]  Occupational Therapy Treatment  Patient Details  Name: Danielle [name] MRN: [id number] Date of Birth: 1932/04/10 Referring Provider (OT): Benny Lennert, MD (Dr. Sinda Du (retired))   Encounter Date: 05/18/2019  OT End of Session - 05/18/19 1619    Visit Number  18    Number of Visits  23    Date for OT Re-Evaluation  06/06/19    Authorization Type  [employer] Medicare Copay $35 No visit limit.    Authorization Time Period  progress note at visit 21    Authorization - Visit Number  18    Authorization - Number of Visits  21    OT Start Time  1508    OT Stop Time  1552    OT Time Calculation (min)  44 min    Activity Tolerance  Patient tolerated treatment well    Behavior During Therapy  [facility] for tasks assessed/performed       Past Medical History:  Diagnosis Date  . Arthritis    "some joint pain once in awhile" (05/26/2017)  . Cataract   . CVA (cerebral vascular accident) ([place]) 2019  . Heart murmur   . History of kidney stones   . Hyperlipemia   . Hypertension   . Hypothyroidism (acquired)   . Osteoporosis   . Pneumothorax 02/05/2017   right-after fall  . Rib fractures 02/05/2017   right side  . Stroke ([place])   . Type II diabetes mellitus ([place])     Past Surgical History:  Procedure Laterality Date  . AUGMENTATION MAMMAPLASTY Bilateral   . COSMETIC SURGERY    . DILATION AND CURETTAGE OF UTERUS    . EYE SURGERY    . FRACTURE SURGERY    . JOINT REPLACEMENT    . REVERSE SHOULDER ARTHROPLASTY Right 05/26/2017  . REVERSE SHOULDER ARTHROPLASTY Right 05/26/2017   Procedure: REVERSE RIGHT SHOULDER ARTHROPLASTY;  Surgeon: Meredith Pel, MD;  Location: [place];  Service: Orthopedics;  Laterality: Right;  . TONSILLECTOMY    . TUBAL LIGATION      There were no vitals filed for this visit.  Subjective Assessment - 05/18/19 1513    Subjective   S: I held the toothbrush with my left hand.    Currently in Pain?  No/denies         [facility] OT Assessment - 05/18/19 1513      Assessment   Medical Diagnosis  left arm weakness      Precautions   Precautions  Fall    Precaution Comments  left hemiparesis    Required Braces or Orthoses  Other Brace/Splint    Other Brace/Splint  night time resting hand splint. thumb brace for extension during the day                OT Treatments/Exercises (OP) - 05/18/19 1515      ADLs   UB Dressing  Pt working on doffing and donning jacket using hemi-dressing techniques. Verbal cuing for sequencing, cuing to use vision prior to tugging and jerking on jacket.       Exercises   Exercises  Hand      Neurological Re-education Exercises   Elbow Flexion  AROM;10 reps;Seated    Elbow Extension  AROM;10 reps;Seated    Other Exercises 1  Table wipe: Pt seated at table in ADL kitchen, with left hand placed on washcloth on  tabletop, worked on pushing washcloth up and down the table to wipe it. Completed 10X with verbal cuing to keep right hand off table and not to push trunk forward.     Other Exercises 2  Ball roll: Pt standing at mat table raised to just below waist height. LUE placed on medium sized pink ball and pt rolling ball foward and angling to each corner 10X, OT providing min guard as need to keep arm on ball. Pt with active flexion in shoulder for task. Pt then completed in abduction with max effort 10X.     Development of Reach  [place]o;Reaching    Reaching to Waist  Pt given various targets to hit at low level reaching into flexion, abduction, horizontal ab/adduction, and pnf patterns. Completed 5X    Sa[place]o Ring Tree  Pt standing at table in ADL kitchen, ring tree placed on table in front of pt. OT held colored rings and pt working to open thumb and index finger to grasp ring, then reaching forward to place on sort vertical pvc pipe. Pt completing all 9 rings with increased time  and max effort. Verbal cuing to keep right arm on table or down by her side.                OT Short Term Goals - 04/04/19 1503      OT SHORT TERM GOAL #1   Title  Patient and family will be provided and educated on HEP as well as any equipment or adaptive strategies to help aid with increasing the functional use of her LUE during ADL tasks.    Time  4    Period  Weeks    Target Date  03/28/19      OT SHORT TERM GOAL #2   Title  Patient will increase LUE P/ROM to [facility] in order to increase ability to complete dressing tasks.    Time  4    Period  Weeks    Status  On-going      OT SHORT TERM GOAL #3   Title  Patient will increase her left hand grip strength to 10# and her pinch to 5# in order to use her left hand to stabilize an object such as a cup or plate while eating.    Time  4    Period  Weeks    Status  On-going      OT SHORT TERM GOAL #4   Title  Patient will demonstrate/report the ability to use her LUE as a stabilizer during a basic daily task such as during meals or grooming.    Time  4    Period  Weeks    Status  On-going        OT Long Term Goals - 03/04/19 1657      OT LONG TERM GOAL #1   Title  patient will demonstrate the ability to use her left UE as an active assist during daily tasks.    Time  8    Period  Weeks    Status  On-going      OT LONG TERM GOAL #2   Title  Patient will increase her LUE A/ROM to allow her to complete functional tasks at waist level with less difficulty.    Time  8    Period  Weeks    Status  On-going      OT LONG TERM GOAL #3   Title  Patient will increase her left hand strength to 15#  and her pinch strength to 8# in order to hold on to items with greater stability and less dropping.    Time  8    Period  Weeks    Status  On-going      OT LONG TERM GOAL #4   Title  Patient will demonstrate increased coordination by completing the 9 hole peg test with therapist providing set up of pegs.    Time  8    Period   Weeks    Status  On-going      OT LONG TERM GOAL #5   Title  Pt will improve her LUE strength to 3/5 in order to complete low level functional reaching tasks while at home requiring greater independence.    Time  8    Period  Weeks    Status  On-going            Plan - 05/18/19 1619    Clinical Impression Statement  A: Pt reports she did hold her toothbrush yesterday. Session focusing on volitional LUE use during functional tasks. Pt working on handwashing and hemi-dressing at beginning and end of session today, verbal cuing required for sequencing. Introduced [employer] ring tree today, pt able to grasp rings with OT set-up and lift LUE to place rings on low vertical pvc pipe, one rest break required. Also working on development of reach and volitional LUE using during table wiping. Pt requiring increased time for exercises, occasional min guard for LUE stability. Improvement in active participation today.    Body Structure / Function / Physical Skills  Balance;ADL;UE functional use;Fascial restriction;Flexibility;FMC;Proprioception;ROM;Coordination;GMC;Sensation;Decreased knowledge of use of DME;IADL;Strength;Edema;Tone    Plan  P: continue working on functional task completion incorporating LUE as assist. Continue with saebo ring tree task       Patient will benefit from skilled therapeutic intervention in order to improve the following deficits and impairments:   Body Structure / Function / Physical Skills: Balance, ADL, UE functional use, Fascial restriction, Flexibility, FMC, Proprioception, ROM, Coordination, GMC, Sensation, Decreased knowledge of use of DME, IADL, Strength, Edema, Tone       Visit Diagnosis: Other symptoms and signs involving the musculoskeletal system  Other lack of coordination    Problem List Patient Active Problem List   Diagnosis Date Noted  . Osteoporosis 04/19/2019  . Encounter for screening mammogram for malignant neoplasm of breast 04/19/2019  .  Hyperlipidemia 04/19/2019  . Right middle cerebral artery stroke ([place]) 01/03/2019  . Carotid stenosis, bilateral 12/30/2018  . Stroke ([place]) 12/24/2018  . Stroke-like symptoms 12/23/2018  . Generalized weakness 06/09/2018  . Hypothyroidism (acquired) 05/03/2018  . Hypertension 05/03/2018  . Uncontrolled type 2 diabetes mellitus with diabetic nephropathy, with long-term current use of insulin ([place])   . Back pain at L4-L5 level 06/14/2013  . Muscle weakness (generalized) 12/17/2012  . Pain in joint, shoulder region 12/17/2012  . Shoulder fracture 12/14/2012  . Proximal humerus fracture 11/29/2012  . FRACTURE, TOE, RIGHT 09/10/2009   Guadelupe Sabin, OTR/L  [phone] 05/18/2019, 4:22 PM  [place] [address] [place], [place], [address] Phone: [phone]   Fax:  [phone]  Name: Danielle [name] MRN: [id number] Date of Birth: January 21, 1932

## 2019-05-18 NOTE — Therapy (Signed)
[place], [place], [address] Phone: [phone]   Fax:  [phone]  Physical Therapy Treatment  Patient Details  Name: Danielle [name] MRN: [id number] Date of Birth: [birth year] Referring Provider (PT): Sinda Du; Benny Lennert   Encounter Date: 05/18/2019  PT End of Session - 05/18/19 1353    Visit Number  13    Number of Visits  16    Date for PT Re-Evaluation  05/25/19    Authorization Type  UHC Medicare - based on medical necessity    Authorization Time Period  03/30/19-->05/25/2019; Progress note complete 04/27/19 visit #6    Authorization - Visit Number  6    Authorization - Number of Visits  10    PT Start Time  [id number]    PT Stop Time  1436    PT Time Calculation (min)  48 min    Equipment Utilized During Treatment  Gait belt;Other (comment)   no AD/ CGA   Activity Tolerance  Patient tolerated treatment well;Patient limited by fatigue    Behavior During Therapy  [facility] for tasks assessed/performed       Past Medical History:  Diagnosis Date  . Arthritis    "some joint pain once in awhile" (05/26/2017)  . Cataract   . CVA (cerebral vascular accident) ([place]) 2019  . Heart murmur   . History of kidney stones   . Hyperlipemia   . Hypertension   . Hypothyroidism (acquired)   . Osteoporosis   . Pneumothorax 02/05/2017   right-after fall  . Rib fractures 02/05/2017   right side  . Stroke ([place])   . Type II diabetes mellitus ([place])     Past Surgical History:  Procedure Laterality Date  . AUGMENTATION MAMMAPLASTY Bilateral   . COSMETIC SURGERY    . DILATION AND CURETTAGE OF UTERUS    . EYE SURGERY    . FRACTURE SURGERY    . JOINT REPLACEMENT    . REVERSE SHOULDER ARTHROPLASTY Right 05/26/2017  . REVERSE SHOULDER ARTHROPLASTY Right 05/26/2017   Procedure: REVERSE RIGHT SHOULDER ARTHROPLASTY;  Surgeon: Meredith Pel, MD;  Location: [facility];  Service: Orthopedics;  Laterality: Right;  .  TONSILLECTOMY    . TUBAL LIGATION      There were no vitals filed for this visit.  Subjective Assessment - 05/18/19 1353    Subjective  Pt stated she is feeling good today, not reports of pain or recent fall.    Pertinent History  CVA, diabetes    Patient Stated Goals  stand up and walk by herself    Currently in Pain?  No/denies         [facility] PT Assessment - 05/18/19 0001      Assessment   Medical Diagnosis  left arm weakness    Referring Provider (PT)  Sinda Du; Lattie Haw Corum    Onset Date/Surgical Date  01/03/19    Hand Dominance  Right    Next MD Visit  None scheduled     Prior Therapy  OT for UE, previous PT after earlier stroke      Precautions   Precautions  Fall    Precaution Comments  left hemiparesis    Required Braces or Orthoses  Other Brace/Splint    Other Brace/Splint  night time resting hand splint. thumb brace for extension during the day                    [facility] Adult PT  Treatment/Exercise - 05/18/19 0001      Knee/Hip Exercises: Standing   Gait Training  stepping strategies: alteranating as well Rt 10x and Lt 10x onto postit stickers      Knee/Hip Exercises: Seated   Sit to Sand  10 reps;without UE support   Rt foot on 2in step for increased WBing LT LE         Balance Exercises - 05/18/19 1413      Balance Exercises: Standing   Stepping Strategy  Anterior;Posterior;Lateral;5 reps;10 reps    Tandem Gait  Forward;2 reps    Retro Gait  2 reps    Sidestepping  2 reps    Step Over Hurdles / Cones  stepping over hurdles - 8 x 4 hurdles with frequent verbal cueing for foot clearance; 6 in hurdles    Marching  Solid surface;Static;10 reps   toe tapping onto 8in step 2 sets x 10 reps; cueing for Lt LE   Sit to Stand  Standard surface;Without upper extremity support   2in step under Rt LE for Lt LE functional strengthening         PT Short Term Goals - 05/17/19 1503      PT SHORT TERM GOAL #1   Title  Pt will be independent  with HEP and perform consistently in order to improve functional outcomes    Time  4    Period  Weeks    Status  Achieved    Target Date  04/27/19      PT SHORT TERM GOAL #2   Title  Patient will be able to complete 5x STS in under 14.8 seconds in order demonstrate improved LE strength to reduce the risk of falls.    Baseline  05/17/19 14.7 sec    Time  4    Period  Weeks    Status  Achieved    Target Date  04/27/19        PT Long Term Goals - 04/27/19 1534      PT LONG TERM GOAL #1   Title  Patient will increase score by at least 7 points on Berg balance scale in order to reduce the risk of falls.    Baseline  04/27/19: 34/56    Time  8    Period  Weeks    Status  On-going      PT LONG TERM GOAL #2   Title  Pt will be able to perform bil tandem stance for greater than 10 seconds without UE support in order to demo improved functional strength and balance.    Baseline  04/27/19: See objective measures    Time  8    Period  Weeks    Status  On-going      PT LONG TERM GOAL #3   Title  Patient will demonstrate ability to perform sit to stand transfer with modified independence without fear of falling to improve overall functional mobility.    Time  8    Period  Weeks    Status  Achieved            Plan - 05/18/19 1447    Clinical Impression Statement  Session focus on stepping stragies to improve balance without AD and hip, trunk and ankle strengthening.  Pt continues to require cueing to slow down and focus on Lt LE lifting to reduce shuffling.  Pt continues to demonstrate fearful of falling when ambulating with no AD, tendency to reach Rt UE out for some reassurance.  Able to ambulate no AD wiht no LOB just CGA and cueing to lift Lt LE.    Personal Factors and Comorbidities  Age;Comorbidity 2;Past/Current Experience;Time since onset of injury/illness/exacerbation    Comorbidities  diabetes, history of CVA    Examination-Activity Limitations  Bathing;Caring for  Others;Carry;Locomotion Level;Squat;Stairs;Stand;Transfers    Examination-Participation Restrictions  Community Activity;Laundry;Shop;Volunteer;Yard Work    [employer]  Evolving/Moderate complexity    Clinical Decision Making  Moderate    Rehab Potential  Fair    PT Frequency  2x / week    PT Duration  8 weeks    PT Treatment/Interventions  ADLs/Self Care Home Management;Aquatic Therapy;Biofeedback;Electrical Stimulation;Iontophoresis 4mg /ml Dexamethasone;Moist Heat;Traction;Ultrasound;Parrafin;Fluidtherapy;Contrast Bath;DME Instruction;Gait training;Stair training;Functional mobility training;Therapeutic activities;Therapeutic exercise;Balance training;Neuromuscular re-education;Patient/family education;Orthotic Fit/Training;Manual techniques;Compression bandaging;Passive range of motion;Dry needling;Energy conservation;Splinting;Taping;Spinal Manipulations;Joint Manipulations    PT Next Visit Plan  Continue to progress BLE strength and balance as tolerated. Add stepping strategies and cotninue hip/trunk strategies for balance correction next visit. Continue foot taps, retro gait and hurdles.    PT Home Exercise Plan  04/05/19: seated marching, seated HR/TR, LAQ       Patient will benefit from skilled therapeutic intervention in order to improve the following deficits and impairments:  Abnormal gait, Decreased activity tolerance, Decreased balance, Decreased endurance, Decreased mobility, Decreased range of motion, Decreased strength, Difficulty walking, Impaired tone, Improper body mechanics  Visit Diagnosis: Other abnormalities of gait and mobility  Muscle weakness (generalized)  Other symptoms and signs involving the nervous system     Problem List Patient Active Problem List   Diagnosis Date Noted  . Osteoporosis 04/19/2019  . Encounter for screening mammogram for malignant neoplasm of breast 04/19/2019  . Hyperlipidemia 04/19/2019  . Right middle cerebral  artery stroke ([place]) 01/03/2019  . Carotid stenosis, bilateral 12/30/2018  . Stroke ([place]) 12/24/2018  . Stroke-like symptoms 12/23/2018  . Generalized weakness 06/09/2018  . Hypothyroidism (acquired) 05/03/2018  . Hypertension 05/03/2018  . Uncontrolled type 2 diabetes mellitus with diabetic nephropathy, with long-term current use of insulin ([place])   . Back pain at L4-L5 level 06/14/2013  . Muscle weakness (generalized) 12/17/2012  . Pain in joint, shoulder region 12/17/2012  . Shoulder fracture 12/14/2012  . Proximal humerus fracture 11/29/2012  . FRACTURE, TOE, RIGHT 09/10/2009   Ihor Austin, LPTA; CBIS [phone]  Aldona Lento 05/18/2019, 2:56 PM  [facility] [address] [place], [place], [address] Phone: [phone]   Fax:  [phone]  Name: Danielle [name] MRN: [id number] Date of Birth: 1932-04-14

## 2019-05-24 ENCOUNTER — Encounter (HOSPITAL_COMMUNITY): Payer: Self-pay

## 2019-05-24 ENCOUNTER — Ambulatory Visit (HOSPITAL_COMMUNITY): Payer: Medicare Other

## 2019-05-24 ENCOUNTER — Encounter (HOSPITAL_COMMUNITY): Payer: Medicare Other | Admitting: Physical Therapy

## 2019-05-24 ENCOUNTER — Encounter (HOSPITAL_COMMUNITY): Payer: Self-pay | Admitting: Physical Therapy

## 2019-05-24 ENCOUNTER — Ambulatory Visit (HOSPITAL_COMMUNITY): Payer: Medicare Other | Attending: Pulmonary Disease | Admitting: Physical Therapy

## 2019-05-24 ENCOUNTER — Other Ambulatory Visit: Payer: Self-pay

## 2019-05-24 DIAGNOSIS — R2689 Other abnormalities of gait and mobility: Secondary | ICD-10-CM | POA: Insufficient documentation

## 2019-05-24 DIAGNOSIS — M6281 Muscle weakness (generalized): Secondary | ICD-10-CM | POA: Diagnosis present

## 2019-05-24 DIAGNOSIS — R29818 Other symptoms and signs involving the nervous system: Secondary | ICD-10-CM | POA: Diagnosis not present

## 2019-05-24 DIAGNOSIS — R278 Other lack of coordination: Secondary | ICD-10-CM | POA: Insufficient documentation

## 2019-05-24 DIAGNOSIS — R29898 Other symptoms and signs involving the musculoskeletal system: Secondary | ICD-10-CM | POA: Insufficient documentation

## 2019-05-24 NOTE — Patient Instructions (Addendum)
SIT TO STAND: Feet Narrow    Place feet close together. Lean chest forward. Raise hips and straighten knees to stand. _10__ reps per set, _1-2__ sets per day, _7__ days per week. Stand near countertop for support as needed.   Copyright  VHI. All rights reserved.  Tandem Stance    Right foot in front of left, heel touching toe both feet "straight ahead". Stand near countertop with upper extremity support. Stand on Foot Triangle of Support with both feet. Balance in this position _30__ seconds. Do with left foot in front of right.  Copyright  VHI. All rights reserved.

## 2019-05-24 NOTE — Therapy (Signed)
[place], [place], [address] Phone: [phone]   Fax:  [phone]  Occupational Therapy Treatment  Patient Details  Name: Danielle [name] MRN: [id number] Date of Birth: Dec 11, 1931 Referring Provider (OT): Benny Lennert, MD (Dr Sinda Du (retired))   Encounter Date: 05/24/2019  OT End of Session - 05/24/19 1631    Visit Number  19    Number of Visits  23    Date for OT Re-Evaluation  06/06/19    Authorization Type  [employer] Medicare Copay $35 No visit limit.    Authorization Time Period  progress note at visit 21    Authorization - Visit Number  19    Authorization - Number of Visits  21    OT Start Time  [id number]    OT Stop Time  1425    OT Time Calculation (min)  40 min    Activity Tolerance  Patient tolerated treatment well    Behavior During Therapy  WFL for tasks assessed/performed       Past Medical History:  Diagnosis Date  . Arthritis    "some joint pain once in awhile" (05/26/2017)  . Cataract   . CVA (cerebral vascular accident) ([place]) 2019  . Heart murmur   . History of kidney stones   . Hyperlipemia   . Hypertension   . Hypothyroidism (acquired)   . Osteoporosis   . Pneumothorax 02/05/2017   right-after fall  . Rib fractures 02/05/2017   right side  . Stroke ([place])   . Type II diabetes mellitus ([place])     Past Surgical History:  Procedure Laterality Date  . AUGMENTATION MAMMAPLASTY Bilateral   . COSMETIC SURGERY    . DILATION AND CURETTAGE OF UTERUS    . EYE SURGERY    . FRACTURE SURGERY    . JOINT REPLACEMENT    . REVERSE SHOULDER ARTHROPLASTY Right 05/26/2017  . REVERSE SHOULDER ARTHROPLASTY Right 05/26/2017   Procedure: REVERSE RIGHT SHOULDER ARTHROPLASTY;  Surgeon: Meredith Pel, MD;  Location: [facility];  Service: Orthopedics;  Laterality: Right;  . TONSILLECTOMY    . TUBAL LIGATION      There were no vitals filed for this visit.  Subjective Assessment - 05/24/19 1626     Subjective   S: I don't know if I'm going to be getting more Botox in this arm or if it's just a follow up.    Currently in Pain?  No/denies         [facility] OT Assessment - 05/24/19 1626      Assessment   Medical Diagnosis  Left arm weakness    Referring Provider (OT)  Benny Lennert, MD   Dr Sinda Du (retired)     Precautions   Precautions  Fall    Precaution Comments  left hemiparesis               OT Treatments/Exercises (OP) - 05/24/19 1629      Exercises   Exercises  Hand      Neurological Re-education Exercises   Development of Reach  Saebo;Reaching    Saebo Ring Tree  Functional grasp and release of  left hand lateral pinch focused on with use of ring tree while standing. Therapist handed colored rings to patient. Once ring was grasp patient placed all rings on vertical pole with minimal shoulder flexion achieved.    Weight Shifting  Lateral;Outside Base of Support    Weight-Shifting Exercises - Outside [place] of Support  Focused on weightshirting and dynamic sitting balance while encouraging use of left side strength to maintain during functional reaching task seated using Saebo ring tree with right arm.                OT Short Term Goals - 04/04/19 1503      OT SHORT TERM GOAL #1   Title  Patient and family will be provided and educated on HEP as well as any equipment or adaptive strategies to help aid with increasing the functional use of her LUE during ADL tasks.    Time  4    Period  Weeks    Target Date  03/28/19      OT SHORT TERM GOAL #2   Title  Patient will increase LUE P/ROM to [facility] in order to increase ability to complete dressing tasks.    Time  4    Period  Weeks    Status  On-going      OT SHORT TERM GOAL #3   Title  Patient will increase her left hand grip strength to 10# and her pinch to 5# in order to use her left hand to stabilize an object such as a cup or plate while eating.    Time  4    Period  Weeks    Status  On-going       OT SHORT TERM GOAL #4   Title  Patient will demonstrate/report the ability to use her LUE as a stabilizer during a basic daily task such as during meals or grooming.    Time  4    Period  Weeks    Status  On-going        OT Long Term Goals - 03/04/19 1657      OT LONG TERM GOAL #1   Title  patient will demonstrate the ability to use her left UE as an active assist during daily tasks.    Time  8    Period  Weeks    Status  On-going      OT LONG TERM GOAL #2   Title  Patient will increase her LUE A/ROM to allow her to complete functional tasks at waist level with less difficulty.    Time  8    Period  Weeks    Status  On-going      OT LONG TERM GOAL #3   Title  Patient will increase her left hand strength to 15# and her pinch strength to 8# in order to hold on to items with greater stability and less dropping.    Time  8    Period  Weeks    Status  On-going      OT LONG TERM GOAL #4   Title  Patient will demonstrate increased coordination by completing the 9 hole peg test with therapist providing set up of pegs.    Time  8    Period  Weeks    Status  On-going      OT LONG TERM GOAL #5   Title  Pt will improve her LUE strength to 3/5 in order to complete low level functional reaching tasks while at home requiring greater independence.    Time  8    Period  Weeks    Status  On-going            Plan - 05/24/19 1632    Clinical Impression Statement  A: patient is unsure if Dr. Letta Pate appointment will be additional Botox or  just a follow up. Patient will call us to let us know if she does receive Botox as we will need to hold therapy for 2 weeks otherwise we will continue as normally. patient has increased tone in left thumb and pointer finger during session as she had max difficulty being able to demonstrate thumb extension in order to complete a lateral pinch. Required several rest breaks and encouragement/verbal cues on technique to complete task.    Body Structure  / Function / Physical Skills  Balance;ADL;UE functional use;Fascial restriction;Flexibility;FMC;Proprioception;ROM;Coordination;GMC;Sensation;Decreased knowledge of use of DME;IADL;Strength;Edema;Tone    Plan  P: continue working on functional task completion incorporating LUE as assist. Continue with saebo ring tree task    Consulted and Agree with Plan of Care  Patient       Patient will benefit from skilled therapeutic intervention in order to improve the following deficits and impairments:   Body Structure / Function / Physical Skills: Balance, ADL, UE functional use, Fascial restriction, Flexibility, FMC, Proprioception, ROM, Coordination, GMC, Sensation, Decreased knowledge of use of DME, IADL, Strength, Edema, Tone       Visit Diagnosis: Other lack of coordination  Other symptoms and signs involving the musculoskeletal system    Problem List Patient Active Problem List   Diagnosis Date Noted  . Osteoporosis 04/19/2019  . Encounter for screening mammogram for malignant neoplasm of breast 04/19/2019  . Hyperlipidemia 04/19/2019  . Right middle cerebral artery stroke ([place]) 01/03/2019  . Carotid stenosis, bilateral 12/30/2018  . Stroke ([place]) 12/24/2018  . Stroke-like symptoms 12/23/2018  . Generalized weakness 06/09/2018  . Hypothyroidism (acquired) 05/03/2018  . Hypertension 05/03/2018  . Uncontrolled type 2 diabetes mellitus with diabetic nephropathy, with long-term current use of insulin ([place])   . Back pain at L4-L5 level 06/14/2013  . Muscle weakness (generalized) 12/17/2012  . Pain in joint, shoulder region 12/17/2012  . Shoulder fracture 12/14/2012  . Proximal humerus fracture 11/29/2012  . FRACTURE, TOE, RIGHT 09/10/2009   Ailene Ravel, OTR/L,CBIS  [phone]  05/24/2019, 4:37 PM  [facility] [address] [place], [place], [address] Phone: [phone]   Fax:  [phone]  Name: Danielle [name] MRN:  [id number] Date of Birth: May 15, 1932

## 2019-05-24 NOTE — Therapy (Signed)
[place], [place], [address] Phone: [phone]   Fax:  [phone]  Physical Therapy Treatment / Progress Note / Discharge Summary  Patient Details  Name: Danielle [name] MRN: [id number] Date of Birth: 07-01-1931 Referring Provider (PT): Sinda Du; Benny Lennert   Encounter Date: 05/24/2019   Progress Note Reporting Period 04/27/19 to 05/24/19  See note below for Objective Data and Assessment of Progress/Goals.       PHYSICAL THERAPY DISCHARGE SUMMARY  Visits from Start of Care: 14  Current functional level related to goals / functional outcomes: See below   Remaining deficits: See below   Education / Equipment: Updated HEP Plan: Patient agrees to discharge.  Patient goals were partially met. Patient is being discharged due to lack of progress.  ?????        PT End of Session - 05/24/19 1713    Visit Number  14    Number of Visits  16    Date for PT Re-Evaluation  05/25/19    Authorization Type  [employer] Medicare - based on medical necessity    Authorization Time Period  03/30/19-->05/25/2019; Progress note complete 04/27/19 visit #6    Authorization - Visit Number  7    Authorization - Number of Visits  10    PT Start Time  1301    PT Stop Time  1340    PT Time Calculation (min)  39 min    Equipment Utilized During Treatment  Gait belt    Activity Tolerance  Patient tolerated treatment well;Patient limited by fatigue    Behavior During Therapy  WFL for tasks assessed/performed       Past Medical History:  Diagnosis Date  . Arthritis    "some joint pain once in awhile" (05/26/2017)  . Cataract   . CVA (cerebral vascular accident) ([place]) 2019  . Heart murmur   . History of kidney stones   . Hyperlipemia   . Hypertension   . Hypothyroidism (acquired)   . Osteoporosis   . Pneumothorax 02/05/2017   right-after fall  . Rib fractures 02/05/2017   right side  . Stroke ([place])   . Type II  diabetes mellitus ([place])     Past Surgical History:  Procedure Laterality Date  . AUGMENTATION MAMMAPLASTY Bilateral   . COSMETIC SURGERY    . DILATION AND CURETTAGE OF UTERUS    . EYE SURGERY    . FRACTURE SURGERY    . JOINT REPLACEMENT    . REVERSE SHOULDER ARTHROPLASTY Right 05/26/2017  . REVERSE SHOULDER ARTHROPLASTY Right 05/26/2017   Procedure: REVERSE RIGHT SHOULDER ARTHROPLASTY;  Surgeon: Meredith Pel, MD;  Location: [facility];  Service: Orthopedics;  Laterality: Right;  . TONSILLECTOMY    . TUBAL LIGATION      There were no vitals filed for this visit.  Subjective Assessment - 05/24/19 1303    Subjective  She reported not having any pain. Reported feeling like her balance and walking has improved. Expressed interest in walking without a cane.    Pertinent History  CVA, diabetes    Patient Stated Goals  stand up and walk by herself    Currently in Pain?  No/denies         [facility] PT Assessment - 05/24/19 0001      Assessment   Medical Diagnosis  Gait and balance deficits    Referring Provider (PT)  Sinda Du; Lattie Haw Corum    Onset Date/Surgical Date  01/03/19  Prior Therapy  OT for UE, previous PT after earlier stroke      Precautions   Precautions  Fall    Precaution Comments  left hemiparesis      Restrictions   Weight Bearing Restrictions  No      Transfers   Five time sit to stand comments   --      Ambulation/Gait   Ambulation/Gait  Yes    Ambulation/Gait Assistance  5: Supervision    Ambulation Distance (Feet)  155 Feet    Assistive device  Large base quad cane    Gait Pattern  Decreased dorsiflexion - right      Static Standing Balance   Static Standing - Comment/# of Minutes  Tandem stance with left foot forward 4 seconds; right unable      Berg Balance Test   Sit to Stand  Able to stand without using hands and stabilize independently    Standing Unsupported  Able to stand safely 2 minutes    Sitting with Back Unsupported but Feet  Supported on Floor or Stool  Able to sit safely and securely 2 minutes    Stand to Sit  Controls descent by using hands    Transfers  Able to transfer safely, minor use of hands    Standing Unsupported with Eyes Closed  Able to stand 10 seconds safely    Standing Unsupported with Feet Together  Able to place feet together independently and stand for 1 minute with supervision    From Standing, Reach Forward with Outstretched Arm  Can reach forward >12 cm safely (5")    From Standing Position, Pick up Object from Floor  Able to pick up shoe, needs supervision    From Standing Position, Turn to Look Behind Over each Shoulder  Looks behind from both sides and weight shifts well    Turn 360 Degrees  Able to turn 360 degrees safely but slowly    Standing Unsupported, Alternately Place Feet on Step/Stool  Needs assistance to keep from falling or unable to try    Standing Unsupported, One Foot in [employer] balance while stepping or standing    Standing on One Leg  Unable to try or needs assist to prevent fall    Total Score  38                           PT Education - 05/24/19 1712    Education Details  Educated on updated HEP, discussed plan to DC, and safety to continue using cane.    Person(s) Educated  Patient    Methods  Explanation;Handout    Comprehension  Verbalized understanding       PT Short Term Goals - 05/24/19 1714      PT SHORT TERM GOAL #1   Title  Pt will be independent with HEP and perform consistently in order to improve functional outcomes    Time  4    Period  Weeks    Status  Achieved    Target Date  04/27/19      PT SHORT TERM GOAL #2   Title  Patient will be able to complete 5x STS in under 14.8 seconds in order demonstrate improved LE strength to reduce the risk of falls.    Baseline  05/17/19 14.7 sec    Time  4    Period  Weeks    Status  Achieved    Target Date  04/27/19        PT Long Term Goals - 05/24/19 1714      PT LONG  TERM GOAL #1   Title  Patient will increase score by at least 7 points on Berg balance scale in order to reduce the risk of falls.    Baseline  05/24/19: 38/56    Time  8    Period  Weeks    Status  On-going      PT LONG TERM GOAL #2   Title  Pt will be able to perform bil tandem stance for greater than 10 seconds without UE support in order to demo improved functional strength and balance.    Baseline  04/27/19: See objective measures    Time  8    Period  Weeks    Status  On-going      PT LONG TERM GOAL #3   Title  Patient will demonstrate ability to perform sit to stand transfer with modified independence without fear of falling to improve overall functional mobility.    Time  8    Period  Weeks    Status  Achieved            Plan - 05/24/19 1718    Clinical Impression Statement  Performed re-assessment of patient's progress towards goals. Patient had met some goals, but made minimal improvement towards other goals. Discussed with patient that at this time feel patient had plateaued in progress towards goals. Discussed plan to discharge and provided patient with updated HEP to focus on functional strengthening and balance. Patient was educated that she could get a new referral for physical therapy in the future if needed.    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/Moderate complexity    Rehab Potential  Fair    PT Frequency  2x / week    PT Duration  8 weeks    PT Treatment/Interventions  ADLs/Self Care Home Management;Aquatic Therapy;Biofeedback;Electrical Stimulation;Iontophoresis 56m/ml  Dexamethasone;Moist Heat;Traction;Ultrasound;Parrafin;Fluidtherapy;Contrast Bath;DME Instruction;Gait training;Stair training;Functional mobility training;Therapeutic activities;Therapeutic exercise;Balance training;Neuromuscular re-education;Patient/family education;Orthotic Fit/Training;Manual techniques;Compression bandaging;Passive range of motion;Dry needling;Energy conservation;Splinting;Taping;Spinal Manipulations;Joint Manipulations    PT Next Visit Plan  Discharged    PT Home Exercise Plan  04/05/19: seated marching, seated HR/TR, LAQ; 05/24/19: STS, tandem stance at counter    Consulted and Agree with Plan of Care  Patient       Patient will benefit from skilled therapeutic intervention in order to improve the following deficits and impairments:  Abnormal gait, Decreased activity tolerance, Decreased balance, Decreased endurance, Decreased mobility, Decreased range of motion, Decreased strength, Difficulty walking, Impaired tone, Improper body mechanics  Visit Diagnosis: Other symptoms and signs involving the nervous system  Other abnormalities of gait and mobility  Muscle weakness (generalized)     Problem List Patient Active Problem List   Diagnosis Date Noted  . Osteoporosis 04/19/2019  . Encounter for screening mammogram for malignant neoplasm of breast 04/19/2019  . Hyperlipidemia 04/19/2019  . Right middle cerebral artery stroke ([facility] 01/03/2019  . Carotid stenosis, bilateral 12/30/2018  . Stroke ([place] 12/24/2018  . Stroke-like symptoms 12/23/2018  . Generalized weakness 06/09/2018  . Hypothyroidism (acquired) 05/03/2018  . Hypertension 05/03/2018  . Uncontrolled type 2 diabetes mellitus with diabetic nephropathy, with long-term current use of  insulin ([place])   . Back pain at L4-L5 level 06/14/2013  . Muscle weakness (generalized) 12/17/2012  . Pain in joint, shoulder region 12/17/2012  . Shoulder fracture 12/14/2012  . Proximal humerus fracture 11/29/2012  .  FRACTURE, TOE, RIGHT 09/10/2009   Clarene Critchley PT, DPT 5:21 PM, 05/24/19 [place], [place], [address] Phone: [phone]   Fax:  [phone]  Name: Danielle [name] MRN: [id number] Date of Birth: August 23, 1931

## 2019-05-26 ENCOUNTER — Encounter: Payer: Medicare Other | Attending: Registered Nurse | Admitting: Physical Medicine & Rehabilitation

## 2019-05-26 ENCOUNTER — Encounter: Payer: Self-pay | Admitting: Physical Medicine & Rehabilitation

## 2019-05-26 ENCOUNTER — Other Ambulatory Visit: Payer: Self-pay

## 2019-05-26 VITALS — BP 145/75 | HR 79 | Temp 97.4°F | Ht 65.0 in | Wt 137.0 lb

## 2019-05-26 DIAGNOSIS — I63511 Cerebral infarction due to unspecified occlusion or stenosis of right middle cerebral artery: Secondary | ICD-10-CM | POA: Insufficient documentation

## 2019-05-26 DIAGNOSIS — E1121 Type 2 diabetes mellitus with diabetic nephropathy: Secondary | ICD-10-CM | POA: Insufficient documentation

## 2019-05-26 DIAGNOSIS — I10 Essential (primary) hypertension: Secondary | ICD-10-CM | POA: Insufficient documentation

## 2019-05-26 DIAGNOSIS — E7849 Other hyperlipidemia: Secondary | ICD-10-CM | POA: Insufficient documentation

## 2019-05-26 DIAGNOSIS — E039 Hypothyroidism, unspecified: Secondary | ICD-10-CM | POA: Insufficient documentation

## 2019-05-26 DIAGNOSIS — E1165 Type 2 diabetes mellitus with hyperglycemia: Secondary | ICD-10-CM | POA: Insufficient documentation

## 2019-05-26 DIAGNOSIS — I69354 Hemiplegia and hemiparesis following cerebral infarction affecting left non-dominant side: Secondary | ICD-10-CM | POA: Diagnosis not present

## 2019-05-26 DIAGNOSIS — I6523 Occlusion and stenosis of bilateral carotid arteries: Secondary | ICD-10-CM | POA: Insufficient documentation

## 2019-05-26 DIAGNOSIS — G8110 Spastic hemiplegia affecting unspecified side: Secondary | ICD-10-CM

## 2019-05-26 DIAGNOSIS — Z794 Long term (current) use of insulin: Secondary | ICD-10-CM | POA: Insufficient documentation

## 2019-05-26 NOTE — Progress Notes (Signed)
Subjective:  Phone visit 7 minutes  Patient ID: Danielle [name], female    DOB: August 29, 1931, 86 y.o.   MRN: [id number] We discussed limitations of phone visit in terms of lack of physical examination. HPI  04/08/2019  Left FDS 50 units Left FDP 25 units Left FCR 50 units Left FPL 25 units Left biceps 50 units  Fingers are straighter but thumb and elbow flexor muscle is not much straighter   Still doing PT, can amb short distance without cane   Pain Inventory Average Pain 0 Pain Right Now 0 My pain is no pain  In the last 24 hours, has pain interfered with the following? General activity 0 Relation with others 0 Enjoyment of life 0 What TIME of day is your pain at its worst? no pain Sleep (in general) Fair  Pain is worse with: no pain Pain improves with: no pain Relief from Meds: no pain  Mobility use a cane use a walker ability to climb steps?  yes do you drive?  no  Function I need assistance with the following:  bathing, meal prep, household duties and shopping  Neuro/Psych dizziness depression  Prior Studies Any changes since last visit?  no  Physicians involved in your care Any changes since last visit?  no   Family History  Problem Relation Age of Onset  . Cancer Mother   . Heart disease Father    Social History   Socioeconomic History  . Marital status: Married    Spouse name: Not on file  . Number of children: Not on file  . Years of education: Not on file  . Highest education level: Not on file  Occupational History  . Occupation: retired  Tobacco Use  . Smoking status: Former Smoker    Packs/day: 1.00    Years: 40.00    Pack years: 40.00    Types: Cigarettes    Quit date: 1991    Years since quitting: 30.0  . Smokeless tobacco: Never Used  Substance and Sexual Activity  . Alcohol use: No  . Drug use: No  . Sexual activity: Yes  Other Topics Concern  . Not on file  Social History Narrative  . Not on file   Social  Determinants of Health   Financial Resource Strain: Unknown  . Difficulty of Paying Living Expenses: Patient refused  Food Insecurity: Unknown  . Worried About Charity fundraiser in the Last Year: Patient refused  . Ran Out of Food in the Last Year: Patient refused  Transportation Needs: Unknown  . Lack of Transportation (Medical): Patient refused  . Lack of Transportation (Non-Medical): Patient refused  Physical Activity: Unknown  . Days of Exercise per Week: Patient refused  . Minutes of Exercise per Session: Patient refused  Stress: Unknown  . Feeling of Stress : Patient refused  Social Connections: Unknown  . Frequency of Communication with Friends and Family: Patient refused  . Frequency of Social Gatherings with Friends and Family: Patient refused  . Attends Religious Services: Patient refused  . Active Member of Clubs or Organizations: Patient refused  . Attends Archivist Meetings: Patient refused  . Marital Status: Patient refused   Past Surgical History:  Procedure Laterality Date  . AUGMENTATION MAMMAPLASTY Bilateral   . COSMETIC SURGERY    . DILATION AND CURETTAGE OF UTERUS    . EYE SURGERY    . FRACTURE SURGERY    . JOINT REPLACEMENT    . REVERSE SHOULDER ARTHROPLASTY Right 05/26/2017  .  REVERSE SHOULDER ARTHROPLASTY Right 05/26/2017   Procedure: REVERSE RIGHT SHOULDER ARTHROPLASTY;  Surgeon: Meredith Pel, MD;  Location: [facility];  Service: Orthopedics;  Laterality: Right;  . TONSILLECTOMY    . TUBAL LIGATION     Past Medical History:  Diagnosis Date  . Arthritis    "some joint pain once in awhile" (05/26/2017)  . Cataract   . CVA (cerebral vascular accident) ([place]) 2019  . Heart murmur   . History of kidney stones   . Hyperlipemia   . Hypertension   . Hypothyroidism (acquired)   . Osteoporosis   . Pneumothorax 02/05/2017   right-after fall  . Rib fractures 02/05/2017   right side  . Stroke ([place])   . Type II diabetes mellitus ([facility])    BP  (!) 145/75 Comment: phone visit- last recorded  Pulse 79 Comment: phone visit- last recorded  Temp (!) 97.4 F (36.3 C) Comment: phone visit- last recorded  Ht 5\' 5"  (1.651 m) Comment: phone visit- last recorded  Wt 137 lb (62.1 kg) Comment: phone visit- last recorded  BMI 22.80 kg/m   Opioid Risk Score:   Fall Risk Score:  `1  Depression screen PHQ 2/9  Depression screen [facility] 2/9 05/26/2019 02/02/2019  Decreased Interest 1 1  Down, Depressed, Hopeless 1 0  PHQ - 2 Score 2 1  Altered sleeping - 2  Tired, decreased energy - 2  Change in appetite - 0  Feeling bad or failure about yourself  - 0  Trouble concentrating - 1  Moving slowly or fidgety/restless - 0  Suicidal thoughts - 0  PHQ-9 Score - 6  Difficult doing work/chores - Very difficult    Review of Systems  Constitutional: Negative.   HENT: Negative.   Eyes: Negative.   Respiratory: Negative.   Cardiovascular: Negative.   Gastrointestinal: Negative.   Endocrine: Negative.   Genitourinary: Negative.   Musculoskeletal: Positive for gait problem.  Skin: Negative.   Allergic/Immunologic: Negative.   Neurological: Positive for dizziness.  Hematological: Bruises/bleeds easily.       Plavix  Psychiatric/Behavioral: Positive for dysphoric mood.  All other systems reviewed and are negative.      Objective:   Physical Exam Vitals and nursing note reviewed.  Constitutional:      Appearance: Normal appearance.  Neurological:     Mental Status: She is alert.    Speech without dysarthria or aphasia. The remainder of exam is limited due to phone visit.       Assessment & Plan:  #1.  Left spastic hemiplegia secondary to right MCA distribution infarct onset 12/23/2018. Patient had good relaxation of the finger flexor musculature in the left upper extremity.  According to patient still has persistent left thumb flexion as well as elbow flexion. Based on patient report would recommend the following   Left biceps 100  units Left brachial radialis 25 units Left FDS 50 units Left FDP 25 units Left FCR 50 units Left FPL 50 units Repeat in 6 weeks

## 2019-05-27 ENCOUNTER — Encounter (HOSPITAL_COMMUNITY): Payer: Medicare Other | Admitting: Physical Therapy

## 2019-05-27 ENCOUNTER — Encounter (HOSPITAL_COMMUNITY): Payer: Self-pay | Admitting: Occupational Therapy

## 2019-05-27 ENCOUNTER — Ambulatory Visit (HOSPITAL_COMMUNITY): Payer: Medicare Other | Admitting: Occupational Therapy

## 2019-05-27 ENCOUNTER — Other Ambulatory Visit: Payer: Self-pay

## 2019-05-27 DIAGNOSIS — R29818 Other symptoms and signs involving the nervous system: Secondary | ICD-10-CM | POA: Diagnosis not present

## 2019-05-27 DIAGNOSIS — R278 Other lack of coordination: Secondary | ICD-10-CM

## 2019-05-27 DIAGNOSIS — R29898 Other symptoms and signs involving the musculoskeletal system: Secondary | ICD-10-CM

## 2019-05-27 NOTE — Therapy (Signed)
[facility], [place], [address] Phone: [phone]   Fax:  [phone]  Occupational Therapy Treatment  Patient Details  Name: Danielle [name] MRN: [id number] Date of Birth: [birth year] Referring Provider (OT): Benny Lennert, MD (Dr Sinda Du (retired))   Encounter Date: 05/27/2019  OT End of Session - 05/27/19 1427    Visit Number  20    Number of Visits  23    Date for OT Re-Evaluation  06/06/19    Authorization Type  [employer] Medicare Copay $35 No visit limit.    Authorization Time Period  progress note at visit 21    Authorization - Visit Number  20    Authorization - Number of Visits  21    OT Start Time  [id number]    OT Stop Time  1427    OT Time Calculation (min)  50 min    Activity Tolerance  Patient tolerated treatment well    Behavior During Therapy  WFL for tasks assessed/performed       Past Medical History:  Diagnosis Date  . Arthritis    "some joint pain once in awhile" (05/26/2017)  . Cataract   . CVA (cerebral vascular accident) ([place]) 2019  . Heart murmur   . History of kidney stones   . Hyperlipemia   . Hypertension   . Hypothyroidism (acquired)   . Osteoporosis   . Pneumothorax 02/05/2017   right-after fall  . Rib fractures 02/05/2017   right side  . Stroke ([place])   . Type II diabetes mellitus ([place])     Past Surgical History:  Procedure Laterality Date  . AUGMENTATION MAMMAPLASTY Bilateral   . COSMETIC SURGERY    . DILATION AND CURETTAGE OF UTERUS    . EYE SURGERY    . FRACTURE SURGERY    . JOINT REPLACEMENT    . REVERSE SHOULDER ARTHROPLASTY Right 05/26/2017  . REVERSE SHOULDER ARTHROPLASTY Right 05/26/2017   Procedure: REVERSE RIGHT SHOULDER ARTHROPLASTY;  Surgeon: Meredith Pel, MD;  Location: [facility];  Service: Orthopedics;  Laterality: Right;  . TONSILLECTOMY    . TUBAL LIGATION      There were no vitals filed for this visit.  Subjective Assessment - 05/27/19 1336     Subjective   S: I go get botox in February.    Currently in Pain?  No/denies         [facility] OT Assessment - 05/27/19 1336      Assessment   Medical Diagnosis  Left arm weakness      Precautions   Precautions  Fall    Precaution Comments  left hemiparesis               OT Treatments/Exercises (OP) - 05/27/19 1341      Exercises   Exercises  Hand      Neurological Re-education Exercises   Elbow Flexion  AROM;10 reps;Seated    Elbow Extension  AROM;10 reps;Seated    Other Exercises 1  Table wipe: Pt seated at table in ADL kitchen, with left hand placed on washcloth on tabletop, worked on pushing washcloth up and down the table to wipe it. Completed 10X with verbal cuing to keep right hand off table and not to push trunk forward.     Development of Reach  Saebo;Reaching    Saebo Ring Tree  Functional grasp and release of  left hand lateral pinch focused on with use of ring tree while standing. Therapist handed  colored rings to patient. Once ring was grasp patient placed all rings on  yellow horizontal bar with minimal shoulder flexion achieved. Verbal cuing to left right hand down by her side. Pinch improved when cuing to open index finger versus focusing on thumb. Pt then worked on placing shower curtain hooks on short vertical pole, min shoulder flexion utilized. Increased time for all tasks.                OT Short Term Goals - 04/04/19 1503      OT SHORT TERM GOAL #1   Title  Patient and family will be provided and educated on HEP as well as any equipment or adaptive strategies to help aid with increasing the functional use of her LUE during ADL tasks.    Time  4    Period  Weeks    Target Date  03/28/19      OT SHORT TERM GOAL #2   Title  Patient will increase LUE P/ROM to [facility] in order to increase ability to complete dressing tasks.    Time  4    Period  Weeks    Status  On-going      OT SHORT TERM GOAL #3   Title  Patient will increase her left hand grip  strength to 10# and her pinch to 5# in order to use her left hand to stabilize an object such as a cup or plate while eating.    Time  4    Period  Weeks    Status  On-going      OT SHORT TERM GOAL #4   Title  Patient will demonstrate/report the ability to use her LUE as a stabilizer during a basic daily task such as during meals or grooming.    Time  4    Period  Weeks    Status  On-going        OT Long Term Goals - 03/04/19 1657      OT LONG TERM GOAL #1   Title  patient will demonstrate the ability to use her left UE as an active assist during daily tasks.    Time  8    Period  Weeks    Status  On-going      OT LONG TERM GOAL #2   Title  Patient will increase her LUE A/ROM to allow her to complete functional tasks at waist level with less difficulty.    Time  8    Period  Weeks    Status  On-going      OT LONG TERM GOAL #3   Title  Patient will increase her left hand strength to 15# and her pinch strength to 8# in order to hold on to items with greater stability and less dropping.    Time  8    Period  Weeks    Status  On-going      OT LONG TERM GOAL #4   Title  Patient will demonstrate increased coordination by completing the 9 hole peg test with therapist providing set up of pegs.    Time  8    Period  Weeks    Status  On-going      OT LONG TERM GOAL #5   Title  Pt will improve her LUE strength to 3/5 in order to complete low level functional reaching tasks while at home requiring greater independence.    Time  8    Period  Weeks    Status  On-going            Plan - 05/27/19 1421    Clinical Impression Statement  A: Pt scheduled to have botox on 07/07/19. Continued with functional reaching task incorporating pinch using Saebo tree. Pt requiring verbal cuing to stand with heels on floor and not use back for movement. Pt requiring occasional rest breaks during tasks. Pt with good elbow flexion when cued, minimal shoulder flexion for reaching tasks. Verbal  cuing for form and technique.    Body Structure / Function / Physical Skills  Balance;ADL;UE functional use;Fascial restriction;Flexibility;FMC;Proprioception;ROM;Coordination;GMC;Sensation;Decreased knowledge of use of DME;IADL;Strength;Edema;Tone    Plan  P: Reassessment, Continue with functional tasks incorporating LUE as assist, work on not using back or standing on toes for reaching       Patient will benefit from skilled therapeutic intervention in order to improve the following deficits and impairments:   Body Structure / Function / Physical Skills: Balance, ADL, UE functional use, Fascial restriction, Flexibility, FMC, Proprioception, ROM, Coordination, GMC, Sensation, Decreased knowledge of use of DME, IADL, Strength, Edema, Tone       Visit Diagnosis: Other lack of coordination  Other symptoms and signs involving the musculoskeletal system    Problem List Patient Active Problem List   Diagnosis Date Noted  . Osteoporosis 04/19/2019  . Encounter for screening mammogram for malignant neoplasm of breast 04/19/2019  . Hyperlipidemia 04/19/2019  . Right middle cerebral artery stroke ([place]) 01/03/2019  . Carotid stenosis, bilateral 12/30/2018  . Stroke ([place]) 12/24/2018  . Stroke-like symptoms 12/23/2018  . Generalized weakness 06/09/2018  . Hypothyroidism (acquired) 05/03/2018  . Hypertension 05/03/2018  . Uncontrolled type 2 diabetes mellitus with diabetic nephropathy, with long-term current use of insulin ([place])   . Back pain at L4-L5 level 06/14/2013  . Muscle weakness (generalized) 12/17/2012  . Pain in joint, shoulder region 12/17/2012  . Shoulder fracture 12/14/2012  . Proximal humerus fracture 11/29/2012  . FRACTURE, TOE, RIGHT 09/10/2009   Guadelupe Sabin, OTR/L  [phone] 05/27/2019, 2:28 PM  [facility] [address] [place], [place], [address] Phone: [phone]   Fax:  [phone]  Name: TRINIECE ZAJICEK MRN: [id number] Date of Birth: 03/02/1932

## 2019-05-30 ENCOUNTER — Encounter: Payer: Self-pay | Admitting: Neurology

## 2019-05-30 ENCOUNTER — Ambulatory Visit: Payer: Medicare Other | Admitting: Neurology

## 2019-05-30 ENCOUNTER — Telehealth (HOSPITAL_COMMUNITY): Payer: Self-pay

## 2019-05-30 ENCOUNTER — Other Ambulatory Visit: Payer: Self-pay

## 2019-05-30 VITALS — BP 131/61 | HR 74 | Temp 97.5°F | Wt 139.0 lb

## 2019-05-30 DIAGNOSIS — G8110 Spastic hemiplegia affecting unspecified side: Secondary | ICD-10-CM | POA: Diagnosis not present

## 2019-05-30 NOTE — Telephone Encounter (Signed)
S/w pt and she understands that Tuesday has cx and she will be here on Thursday

## 2019-05-30 NOTE — Patient Instructions (Signed)
I had a long d/w patient and her husband about her recent stroke, right carotid occlusion and left carotid stenosis,risk for recurrent stroke/TIAs, personally independently reviewed imaging studies and stroke evaluation results and answered questions.Continue  Plavix  for secondary stroke prevention and maintain strict control of hypertension with blood pressure goal below 130/90, diabetes with hemoglobin A1c goal below 6.5% and lipids with LDL cholesterol goal below 70 mg/dL. I also advised the patient to eat a healthy diet with plenty of whole grains, cereals, fruits and vegetables, exercise regularly and maintain ideal body weight.  Continue outpatient physical and occupational therapy Continue conservative follow-up for her meningioma as it is asymptomatic at the present time and it will need follow-up MRIs at 1-2-year intervals.. Recommend she continue follow-up with Dr. Carlis Abbott for asymptomatic left carotid surveillance and revascularization if necessary and with Dr. Letta Pate for Botox for spasticity.  No scheduled appointment with me is necessary but she may return as needed only

## 2019-05-30 NOTE — Progress Notes (Signed)
[facility] [address] [place] [address] [phone]       OFFICE CONSULT NOTE  [name] Date of Birth:  1932/03/18 Medical Record Number:  [id number]   Referring MD: Rosalin Hawking  Reason for Referral: Stroke  [id number] Consult 02/22/2019 ;  [name] is 86 year old pleasant Caucasian lady who is seen today for initial office consultation visit.  She is accompanied by her daughter.  History is obtained from them, review of electronic medical records and I personally reviewed imaging films in PACS.  [name] has past medical history of hypothyroidism, hypertension, hyperlipidemia, diabetes previous stroke and 05/03/2018 as well as 12/23/2018 for right MCA stroke with right carotid occlusion.  She presented to [facility] with worsening left-sided weakness and gait imbalance and MRI scan showed acute right MCA infarct involving frontal and parietal lobes.  She was seen by Dr. Merlene Laughter and CT angiogram of the head and neck showed bilateral string sign involving both carotid artery origins due to bulky calcification patient was transferred to [facility] for consultation for possible revascularization.  On exam she had subtle left facial weakness and significant left upper extremity paralysis and mild left leg weakness.  MRI scan confirmed new right MCA infarct involving frontal and parietal lobes as well as subacute to chronic right MCA infarcts as well.  There is stable appearance of left parafalcine meningioma and chronic right ICA occlusion.  2D echo showed normal ejection fraction.  LDL cholesterol was 97 mg percent.  Hemoglobin A1c was 10.3.  Vascular surgery was consulted for consideration for revascularization of the left carotid and his right carotid occlusion was felt to be chronic.  However carotid ultrasound showed only  60 to 79% left ICA stenosis and chronic right carotid occlusion.  Patient was seen for outpatient follow-up  by Dr. Carlis Abbott last week who recommended conservative medical management with aggressive risk factor modification and close follow-up for left carotid stenosis and not to revascularize unless the stenosis increased to 80%.  Patient is presently living at home.  She is not opted much improvement in the left upper extremity though she is left lower extremity strength is improved now she is able to walk with a cane and a walker.  She is currently getting physical and occupational therapy.  She is tolerating aspirin and Plavix without bruising or bleeding.  She states her blood pressure is under good control and today it is 123/53.  She has had no neurological issues since admission in the hospital. History of Stroke  12/23/2018 - L arm weakness. Tele neuro consulted in ED. MRI showed small R MCA acute and chronic infarcts. MRA head right MCA decrease flow. EEG neg. CUS showed R ICA occluded, L ICA 50-69%. EF 60-65%, A1C 10.3 and LDL 97. Still with weakness at d/c, hp improving.  refused SNF. D/c home on ASA 325, lipitor and fish oil.  04/2018 - left arm weakness, confusion, and difficulty walking - R frontal and parietal lobe infarcts in setting of R ICA occlusion. MRA head and neck right ICA occlusion, left ICA 50% stenosis and b/l P2 severe stenosis. CUS right ICA occlusion and left 50-69% stenosis. EF 65-70%. A1C 7.9 and LDL 163. D/c on aspirin and plavix. Statin added. [facility]) Update 05/30/2019 ; She returns for follow-up after last visit 3 months ago.  She recommended my husband.  She continues to do well she has had no further stroke or TIA symptoms.  She has  been getting Botox injections for spasticity in the left hand and from Dr. Letta Pate and has noticed some improvement with increased range of motion of the left hand.  She is walking with a 4 pronged cane for support and has done well without any falls or injuries.  She is currently participating in outpatient physical and occupational  therapy in [place] which has helped her.  She has stopped aspirin and is now on Plavix alone and tolerating it well without bruising or bleeding.  Blood pressures well controlled today it is 131/61.  Blood sugars continue to fluctuate and she is seeing Dr. Reynaldo Minium and recently they misunderstood his dosing instructions but seem to have gotten them right now.  She has an upcoming follow-up appointment with Dr. Carlis Abbott to follow her moderate asymptomatic left carotid stenosis.  She has no complaints today. ROS:   14 system review of systems is positive for weakness, difficulty walking, gait difficulty all other systems negative  PMH:  Past Medical History:  Diagnosis Date  . Arthritis    "some joint pain once in awhile" (05/26/2017)  . Cataract   . CVA (cerebral vascular accident) ([place]) 2019  . Heart murmur   . History of kidney stones   . Hyperlipemia   . Hypertension   . Hypothyroidism (acquired)   . Osteoporosis   . Pneumothorax 02/05/2017   right-after fall  . Rib fractures 02/05/2017   right side  . Stroke ([place])   . Type II diabetes mellitus ([place])     Social History:  Social History   Socioeconomic History  . Marital status: Married    Spouse name: Not on file  . Number of children: Not on file  . Years of education: Not on file  . Highest education level: Not on file  Occupational History  . Occupation: retired  Tobacco Use  . Smoking status: Former Smoker    Packs/day: 1.00    Years: 40.00    Pack years: 40.00    Types: Cigarettes    Quit date: 1991    Years since quitting: 30.0  . Smokeless tobacco: Never Used  Substance and Sexual Activity  . Alcohol use: No  . Drug use: No  . Sexual activity: Yes  Other Topics Concern  . Not on file  Social History Narrative  . Not on file   Social Determinants of Health   Financial Resource Strain: Unknown  . Difficulty of Paying Living Expenses: Patient refused  Food Insecurity: Unknown  . Worried About Ship broker in the Last Year: Patient refused  . Ran Out of Food in the Last Year: Patient refused  Transportation Needs: Unknown  . Lack of Transportation (Medical): Patient refused  . Lack of Transportation (Non-Medical): Patient refused  Physical Activity: Unknown  . Days of Exercise per Week: Patient refused  . Minutes of Exercise per Session: Patient refused  Stress: Unknown  . Feeling of Stress : Patient refused  Social Connections: Unknown  . Frequency of Communication with Friends and Family: Patient refused  . Frequency of Social Gatherings with Friends and Family: Patient refused  . Attends Religious Services: Patient refused  . Active Member of Clubs or Organizations: Patient refused  . Attends Archivist Meetings: Patient refused  . Marital Status: Patient refused  Intimate Partner Violence: Unknown  . Fear of Current or Ex-Partner: Patient refused  . Emotionally Abused: Patient refused  . Physically Abused: Patient refused  . Sexually Abused: Patient refused  Medications:   Current Outpatient Medications on File Prior to Visit  Medication Sig Dispense Refill  . acetaminophen (TYLENOL) 325 MG tablet Take 2 tablets (650 mg total) by mouth every 6 (six) hours as needed for mild pain (or Fever >/= 101).    Marland Kitchen amLODipine (NORVASC) 5 MG tablet Take 1 tablet (5 mg total) by mouth daily. 30 tablet 11  . aspirin 325 MG tablet Take 1 tablet (325 mg total) by mouth daily. 30 tablet 12  . atorvastatin (LIPITOR) 80 MG tablet Take 1 tablet (80 mg total) by mouth daily at 6 PM. 30 tablet 0  . Calcium Carb-Cholecalciferol (CALCIUM 600 + D PO) Take 1 tablet by mouth daily.    . Calcium Carbonate-Vitamin D (CALCIUM-VITAMIN D3 PO) Take 600 mg by mouth.    . clopidogrel (PLAVIX) 75 MG tablet Take 75 mg by mouth daily.    . diclofenac sodium (VOLTAREN) 1 % GEL Apply 2 g topically 4 (four) times daily. 2 g 0  . Insulin Glargine, 2 Unit Dial, (TOUJEO MAX SOLOSTAR) 300 UNIT/ML SOPN  Inject 30 Units into the skin daily before breakfast.     . Insulin Lispro (HUMALOG KWIKPEN) 200 UNIT/ML SOPN Inject into the skin. 10-12 units BID    . Multiple Vitamins-Minerals (OCUVITE ADULT 50+ PO) Take by mouth.    . multivitamin (PROSIGHT) TABS tablet Take 1 tablet by mouth daily. 30 tablet 0  . senna-docusate (SENOKOT-S) 8.6-50 MG tablet Take 2 tablets by mouth at bedtime. (Patient taking differently: Take 2 tablets by mouth at bedtime as needed. )    . SYNTHROID 100 MCG tablet Take 1 tablet (100 mcg total) by mouth every morning. 30 tablet 0  . vitamin B-12 (CYANOCOBALAMIN) 1000 MCG tablet Take 1 tablet (1,000 mcg total) by mouth daily. 30 tablet 0   No current facility-administered medications on file prior to visit.    Allergies:   Allergies  Allergen Reactions  . Fosamax [Alendronate Sodium] Other (See Comments)    MUSCLE ACHES  . Prednisone     Feels jittery and nauseous    Physical Exam General: Frail elderly Caucasian lady seated, in no evident distress Head: head normocephalic and atraumatic.   Neck: supple with soft bilateral carotid bruits left more than right. Cardiovascular: regular rate and rhythm, no murmurs Musculoskeletal: no deformity Skin:  no rash/petichiae Vascular:  Normal pulses all extremities  Neurologic Exam Mental Status: Awake and fully alert. Oriented to place and time. Recent and remote memory intact. Attention span, concentration and fund of knowledge appropriate. Mood and affect appropriate.  Cranial Nerves: Fundoscopic exam not done. Pupils equal, briskly reactive to light. Extraocular movements full without nystagmus. Visual fields full to confrontation. Hearing intact. Facial sensation intact. Face, tongue, palate moves normally and symmetrically.  Motor: Left hemiplegia with left upper extremity 1/5 strength left lower extremity 4/5 strength.  There is mild left foot drop and there is nonfixed left hand flexion contracture Sensory.: intact  to touch , pinprick , position and vibratory sensation.  Coordination: Rapid alternating movements normal in all extremities. Finger-to-nose and heel-to-shin performed accurately bilaterally. Gait and Station: Spastic hemiplegic gait with left foot drop and circumduction  Reflexes: 2+ and asymmetric and brisker on the left.. Toes downgoing.   NIHSS  4 Modified Rankin  2   ASSESSMENT: 86 year old Caucasian lady with right hemispheric infarct secondary to symptomatic right carotid chronic occlusion and moderate left carotid asymptomatic stenosis.  Multiple vascular risk factors of carotid disease, hypertension, hyperlipidemia diabetes.  She also has incidental left frontal parasagittal meningioma which is asymptomatic.     PLAN: I had a long d/w patient and her husband about her recent stroke, right carotid occlusion and left carotid stenosis,risk for recurrent stroke/TIAs, personally independently reviewed imaging studies and stroke evaluation results and answered questions.Continue  Plavix  for secondary stroke prevention and maintain strict control of hypertension with blood pressure goal below 130/90, diabetes with hemoglobin A1c goal below 6.5% and lipids with LDL cholesterol goal below 70 mg/dL. I also advised the patient to eat a healthy diet with plenty of whole grains, cereals, fruits and vegetables, exercise regularly and maintain ideal body weight.  Continue outpatient physical and occupational therapy Continue conservative follow-up for her meningioma as it is asymptomatic at the present time and it will need follow-up MRIs at 1-2-year intervals.. Recommend she continue follow-up with Dr. Carlis Abbott for asymptomatic left carotid surveillance and revascularization if necessary and with Dr. Letta Pate for Botox for spasticity.  No scheduled appointment with me is necessary but she may return as needed only.  Greater than 50% time during this 25-minutevisit was spent on counseling and coordination of  care about her stroke, carotid occlusion and stenosis discussion about prevention and treatment plan and answering questions   Antony Contras, MD .  Note: This document was prepared with digital dictation and possible smart phrase technology. Any transcriptional errors that result from this process are unintentional.

## 2019-05-31 ENCOUNTER — Ambulatory Visit (HOSPITAL_COMMUNITY): Payer: Medicare Other

## 2019-05-31 ENCOUNTER — Encounter (HOSPITAL_COMMUNITY): Payer: Medicare Other | Admitting: Physical Therapy

## 2019-06-02 ENCOUNTER — Ambulatory Visit (HOSPITAL_COMMUNITY): Payer: Medicare Other | Admitting: Occupational Therapy

## 2019-06-02 ENCOUNTER — Encounter (HOSPITAL_COMMUNITY): Payer: Medicare Other | Admitting: Physical Therapy

## 2019-06-02 ENCOUNTER — Other Ambulatory Visit: Payer: Self-pay

## 2019-06-02 ENCOUNTER — Encounter (HOSPITAL_COMMUNITY): Payer: Self-pay | Admitting: Occupational Therapy

## 2019-06-02 DIAGNOSIS — R29898 Other symptoms and signs involving the musculoskeletal system: Secondary | ICD-10-CM

## 2019-06-02 DIAGNOSIS — R278 Other lack of coordination: Secondary | ICD-10-CM

## 2019-06-02 DIAGNOSIS — R29818 Other symptoms and signs involving the nervous system: Secondary | ICD-10-CM | POA: Diagnosis not present

## 2019-06-02 NOTE — Therapy (Addendum)
[facility] [place], [place], [address] Phone: [phone]   Fax:  [phone]  Occupational Therapy Reassessment & Treatment; Discharge Summary  Patient Details  Name: Danielle [name] MRN: [id number] Date of Birth: 1932-01-31 Referring Provider (OT): Benny Lennert, MD (Dr Sinda Du (retired))   Encounter Date: 06/02/2019  OT End of Session - 06/02/19 1701    Visit Number  21    Number of Visits  23    Date for OT Re-Evaluation  06/06/19    Authorization Type  [employer] Medicare Copay $35 No visit limit.    Authorization Time Period  progress note at visit 21    Authorization - Visit Number  21    Authorization - Number of Visits  21    OT Start Time  5329    OT Stop Time  1427    OT Time Calculation (min)  42 min    Activity Tolerance  Patient tolerated treatment well    Behavior During Therapy  WFL for tasks assessed/performed       Past Medical History:  Diagnosis Date  . Arthritis    "some joint pain once in awhile" (05/26/2017)  . Cataract   . CVA (cerebral vascular accident) ([place]) 2019  . Heart murmur   . History of kidney stones   . Hyperlipemia   . Hypertension   . Hypothyroidism (acquired)   . Osteoporosis   . Pneumothorax 02/05/2017   right-after fall  . Rib fractures 02/05/2017   right side  . Stroke ([place])   . Type II diabetes mellitus ([place])     Past Surgical History:  Procedure Laterality Date  . AUGMENTATION MAMMAPLASTY Bilateral   . COSMETIC SURGERY    . DILATION AND CURETTAGE OF UTERUS    . EYE SURGERY    . FRACTURE SURGERY    . JOINT REPLACEMENT    . REVERSE SHOULDER ARTHROPLASTY Right 05/26/2017  . REVERSE SHOULDER ARTHROPLASTY Right 05/26/2017   Procedure: REVERSE RIGHT SHOULDER ARTHROPLASTY;  Surgeon: Meredith Pel, MD;  Location: [facility];  Service: Orthopedics;  Laterality: Right;  . TONSILLECTOMY    . TUBAL LIGATION      There were no vitals filed for this visit.  Subjective  Assessment - 06/02/19 1349    Subjective   S: I've just been moving my arm back and forth.    Currently in Pain?  No/denies         [facility] OT Assessment - 06/02/19 1342      Assessment   Medical Diagnosis  Left arm weakness      Precautions   Precautions  Fall    Precaution Comments  left hemiparesis      Coordination   Tremors  minimal tremors   same as previous note     AROM   Overall AROM Comments  65 degrees of scaption    AROM Assessment Site  Shoulder;Elbow;Forearm;Wrist    Right/Left Shoulder  Left    Left Shoulder ABduction  63 Degrees   not previously assessed    Right/Left Elbow  Left    Left Elbow Flexion  112   90 previous   Left Elbow Extension  -32   -45 previous   Left Forearm Pronation  --   unable   Left Forearm Supination  75 Degrees   74 previous   Right/Left Wrist  Left    Left Wrist Extension  15 Degrees   10 previous   Left Wrist  Flexion  15 Degrees   30 previous     PROM   Overall PROM Comments  Assessed seated. IR/er adducted    PROM Assessment Site  Shoulder    Right/Left Shoulder  Left    Left Shoulder Flexion  135 Degrees   same as previous   Left Shoulder ABduction  140 Degrees   180 previous   Left Shoulder Internal Rotation  90 Degrees   same as previous   Left Shoulder External Rotation  80 Degrees   same as previous     Strength   Left Shoulder Flexion  1/5   same as previous   Left Shoulder ABduction  2-/5   1/5 previous   Left Shoulder Internal Rotation  1/5   same as previous   Left Shoulder External Rotation  1/5   same as previous   Left Elbow Flexion  3-/5   same as previous   Left Elbow Extension  3-/5   0/5 previous   Left Wrist Flexion  3-/5   3/5 previous   Left Wrist Extension  3-/5   same as previous   Left Hand Grip (lbs)  0   unable due to thumb pain and stiffness   Left Hand Lateral Pinch  4 lbs   2 previous              OT Treatments/Exercises (OP) - 06/02/19 1402      Exercises    Exercises  Hand      Neurological Re-education Exercises   Development of Reach  Saebo;Reaching    Saebo Ring Tree  Functional grasp and release of  left hand lateral pinch focused on with use of ring tree while standing. Therapist handed colored rings to patient. Once ring was grasp patient placed all rings on  yellow horizontal bar with minimal shoulder flexion achieved. Verbal cuing to left right hand down by her side. Pinch improved when cuing to open index finger versus focusing on thumb. Pt then worked on placing shower curtain hooks on short vertical pole, min shoulder flexion utilized. Increased time for all tasks.                OT Short Term Goals - 06/02/19 1701      OT SHORT TERM GOAL #1   Title  Patient and family will be provided and educated on HEP as well as any equipment or adaptive strategies to help aid with increasing the functional use of her LUE during ADL tasks.    Time  4    Period  Weeks    Target Date  03/28/19      OT SHORT TERM GOAL #2   Title  Patient will increase LUE P/ROM to [facility] in order to increase ability to complete dressing tasks.    Time  4    Period  Weeks    Status  Partially Met      OT SHORT TERM GOAL #3   Title  Patient will increase her left hand grip strength to 10# and her pinch to 5# in order to use her left hand to stabilize an object such as a cup or plate while eating.    Time  4    Period  Weeks    Status  On-going      OT SHORT TERM GOAL #4   Title  Patient will demonstrate/report the ability to use her LUE as a stabilizer during a basic daily task such as during meals or grooming.  Time  4    Period  Weeks    Status  On-going        OT Long Term Goals - 03/04/19 1657      OT LONG TERM GOAL #1   Title  patient will demonstrate the ability to use her left UE as an active assist during daily tasks.    Time  8    Period  Weeks    Status  On-going      OT LONG TERM GOAL #2   Title  Patient will increase her LUE  A/ROM to allow her to complete functional tasks at waist level with less difficulty.    Time  8    Period  Weeks    Status  On-going      OT LONG TERM GOAL #3   Title  Patient will increase her left hand strength to 15# and her pinch strength to 8# in order to hold on to items with greater stability and less dropping.    Time  8    Period  Weeks    Status  On-going      OT LONG TERM GOAL #4   Title  Patient will demonstrate increased coordination by completing the 9 hole peg test with therapist providing set up of pegs.    Time  8    Period  Weeks    Status  On-going      OT LONG TERM GOAL #5   Title  Pt will improve her LUE strength to 3/5 in order to complete low level functional reaching tasks while at home requiring greater independence.    Time  8    Period  Weeks    Status  On-going            Plan - 06/02/19 1702    Clinical Impression Statement  A: Reassessment completed this session, pt has improved left shoulder activation somewhat since prior assessment. She has met 1 STG and partially met an additional goal. Pt requires frequent verbal cuing to incorporate LUE into functional tasks as stabilizer or assist, poor carryover at home. Pt is currently at a plateau with therapy and functional gains due to tone limiting further progression of LUE strengthening and functional task completion. Discussed progress and HEP follow through with pt and husband, recommending holding therapy until pt sees MD for potential botox. Pt and husband verbalized understanding.    Body Structure / Function / Physical Skills  Balance;ADL;UE functional use;Fascial restriction;Flexibility;FMC;Proprioception;ROM;Coordination;GMC;Sensation;Decreased knowledge of use of DME;IADL;Strength;Edema;Tone    Plan  P: Hold therapy until MD appt. IF MD decides to go ahead with botox pt can come back for reassessment and potentially continue with therapy after 14 days.       Patient will benefit from skilled  therapeutic intervention in order to improve the following deficits and impairments:   Body Structure / Function / Physical Skills: Balance, ADL, UE functional use, Fascial restriction, Flexibility, FMC, Proprioception, ROM, Coordination, GMC, Sensation, Decreased knowledge of use of DME, IADL, Strength, Edema, Tone       Visit Diagnosis: Other lack of coordination  Other symptoms and signs involving the musculoskeletal system    Problem List Patient Active Problem List   Diagnosis Date Noted  . Osteoporosis 04/19/2019  . Encounter for screening mammogram for malignant neoplasm of breast 04/19/2019  . Hyperlipidemia 04/19/2019  . Right middle cerebral artery stroke ([place]) 01/03/2019  . Carotid stenosis, bilateral 12/30/2018  . Stroke ([place]) 12/24/2018  . Stroke-like symptoms  12/23/2018  . Generalized weakness 06/09/2018  . Hypothyroidism (acquired) 05/03/2018  . Hypertension 05/03/2018  . Uncontrolled type 2 diabetes mellitus with diabetic nephropathy, with long-term current use of insulin ([place])   . Back pain at L4-L5 level 06/14/2013  . Muscle weakness (generalized) 12/17/2012  . Pain in joint, shoulder region 12/17/2012  . Shoulder fracture 12/14/2012  . Proximal humerus fracture 11/29/2012  . FRACTURE, TOE, RIGHT 09/10/2009   Guadelupe Sabin, OTR/L  [phone] 06/02/2019, 5:10 PM  [place], [place], [address] Phone: [phone]   Fax:  [phone]  Name: Danielle [name] MRN: [id number] Date of Birth: 11/24/1931    Addendum 07/26/19 OCCUPATIONAL THERAPY DISCHARGE SUMMARY  Visits from Start of Care: 21  Current functional level related to goals / functional outcomes: See above.  Pt has had botox and was referred back to therapy by different MD. Will discharge from current encounter and begin new episode with new MD order/referral.   Remaining deficits: See above.    Education /  Equipment: Functional use of LUE, ROM exercises Plan: Patient agrees to discharge.  Patient goals were partially met. Patient is being discharged due to meeting the stated rehab goals.  ?????

## 2019-06-07 ENCOUNTER — Encounter (HOSPITAL_COMMUNITY): Payer: Medicare Other | Admitting: Physical Therapy

## 2019-06-07 ENCOUNTER — Encounter (HOSPITAL_COMMUNITY): Payer: Medicare Other

## 2019-06-09 ENCOUNTER — Encounter (HOSPITAL_COMMUNITY): Payer: Medicare Other

## 2019-06-10 ENCOUNTER — Encounter (HOSPITAL_COMMUNITY): Payer: Medicare Other | Admitting: Occupational Therapy

## 2019-06-14 ENCOUNTER — Encounter (HOSPITAL_COMMUNITY): Payer: Medicare Other | Admitting: Physical Therapy

## 2019-06-14 ENCOUNTER — Encounter (HOSPITAL_COMMUNITY): Payer: Medicare Other

## 2019-06-16 ENCOUNTER — Encounter (HOSPITAL_COMMUNITY): Payer: Medicare Other | Admitting: Physical Therapy

## 2019-06-16 ENCOUNTER — Encounter (HOSPITAL_COMMUNITY): Payer: Medicare Other

## 2019-07-07 ENCOUNTER — Encounter: Payer: Medicare Other | Admitting: Physical Medicine & Rehabilitation

## 2019-07-14 ENCOUNTER — Other Ambulatory Visit: Payer: Self-pay

## 2019-07-14 ENCOUNTER — Encounter: Payer: Medicare Other | Attending: Registered Nurse | Admitting: Physical Medicine & Rehabilitation

## 2019-07-14 ENCOUNTER — Encounter: Payer: Self-pay | Admitting: Physical Medicine & Rehabilitation

## 2019-07-14 VITALS — BP 142/70 | HR 88 | Temp 97.7°F

## 2019-07-14 DIAGNOSIS — I63511 Cerebral infarction due to unspecified occlusion or stenosis of right middle cerebral artery: Secondary | ICD-10-CM | POA: Insufficient documentation

## 2019-07-14 DIAGNOSIS — G8110 Spastic hemiplegia affecting unspecified side: Secondary | ICD-10-CM | POA: Diagnosis not present

## 2019-07-14 DIAGNOSIS — G8114 Spastic hemiplegia affecting left nondominant side: Secondary | ICD-10-CM | POA: Diagnosis not present

## 2019-07-14 DIAGNOSIS — E1121 Type 2 diabetes mellitus with diabetic nephropathy: Secondary | ICD-10-CM | POA: Diagnosis present

## 2019-07-14 DIAGNOSIS — E1165 Type 2 diabetes mellitus with hyperglycemia: Secondary | ICD-10-CM | POA: Diagnosis present

## 2019-07-14 DIAGNOSIS — E7849 Other hyperlipidemia: Secondary | ICD-10-CM | POA: Diagnosis present

## 2019-07-14 DIAGNOSIS — I6523 Occlusion and stenosis of bilateral carotid arteries: Secondary | ICD-10-CM | POA: Diagnosis present

## 2019-07-14 DIAGNOSIS — I10 Essential (primary) hypertension: Secondary | ICD-10-CM | POA: Diagnosis present

## 2019-07-14 DIAGNOSIS — E039 Hypothyroidism, unspecified: Secondary | ICD-10-CM | POA: Insufficient documentation

## 2019-07-14 DIAGNOSIS — Z794 Long term (current) use of insulin: Secondary | ICD-10-CM | POA: Diagnosis present

## 2019-07-14 NOTE — Patient Instructions (Addendum)
You received a Botox injection today. You may experience soreness at the needle injection sites. Please call us if any of the injection sites turns red after a couple days or if there is any drainage. You may experience muscle weakness as a result of Botox. This would improve with time but can take several weeks to improve. The Botox should start working in about one week. The Botox usually last 3 months. The injection can be repeated every 3 months as needed.  The following muscles were injected Left FDS 50 units Left FDP 25 units Left FCR 50 units  Left biceps 50 units  Pectoralis 125 units left

## 2019-07-14 NOTE — Progress Notes (Signed)
Botox Injection for spasticity using needle EMG guidance  Dilution: 50 Units/ml Indication: Severe spasticity which interferes with ADL,mobility and/or  hygiene and is unresponsive to medication management and other conservative care Informed consent was obtained after describing risks and benefits of the procedure with the patient. This includes bleeding, bruising, infection, excessive weakness, or medication side effects. A REMS form is on file and signed. Needle: 27g 1" needle electrode Number of units per muscle  Left FDS 50 units Left FDP 50 units Left FCR 25 units Left biceps 50 units  Left Pectoralis 125units All injections were done after obtaining appropriate EMG activity and after negative drawback for blood. The patient tolerated the procedure well. Post procedure instructions were given. A followup appointment was made.

## 2019-07-14 NOTE — Addendum Note (Signed)
Addended by: Charlett Blake on: 07/14/2019 04:21 PM   Modules accepted: Orders

## 2019-07-25 ENCOUNTER — Ambulatory Visit (HOSPITAL_COMMUNITY): Payer: Medicare Other | Attending: Pulmonary Disease

## 2019-07-25 ENCOUNTER — Other Ambulatory Visit: Payer: Self-pay

## 2019-07-25 ENCOUNTER — Encounter (HOSPITAL_COMMUNITY): Payer: Self-pay

## 2019-07-25 DIAGNOSIS — R29898 Other symptoms and signs involving the musculoskeletal system: Secondary | ICD-10-CM | POA: Diagnosis present

## 2019-07-25 DIAGNOSIS — R29818 Other symptoms and signs involving the nervous system: Secondary | ICD-10-CM | POA: Diagnosis present

## 2019-07-25 DIAGNOSIS — R278 Other lack of coordination: Secondary | ICD-10-CM | POA: Diagnosis present

## 2019-07-25 NOTE — Patient Instructions (Signed)
Complete the following exercises at least once a day. Complete 5-10 repetitions each.  Complete the shoulder stretches laying down if possible to support the shoulder and shoulder blade.   PROM - SHLOUDER FLEXION EXTENSION  Grasp the subject's arm and raise it up and towards over the patient's head if range permits. Then lower back down and repeat.   PROM - SHOULDER ADDUCTION ADDUCTION  Grasp the subject's arm and move it away from the patient's side towards the head if range permits. Then return to original position and repeat.     PROM - SHOULDER ER  IR  Hold the patient's elbow off the table and then rotate the shoulder pivoting through the upper arm as shown and repeat.      PROM - PRONATION SUPINATION  Using your unaffected hand, grasp the wrist of your affected arm and rotate the forearm so your palm faces up. Then, turn the wrist the opposite direction so your palm rotates to facing downward. Repeat this as you alternate rotating directions.  Your affected hand/wrist should be fully relaxed.    Wrist Flexion  Prop patient's elbow on table. Grasp patient's forearm close to the wrist, but not over wrist joint, with one hand. Grasp patient's hand with thumb on palm of hand and gently flex wrist. Patient should report stretching feel and no more than mild pain. This stretch may be completed in supine if more comfortable for patient    Wrist Extension  Have patient prop elbow on table. Grasp underneath patient's forearm close to the wrist, but not over the wrist joint, with one hand. Grasp palm of patient's hand with thumb and back of hand with fingers. Gently extend patient's wrist to end of range. Patient should report stretching sensation and no more than mild pain.  This may be complete while patient is lying supine.    PROM - FINGER - FLEXION EXTENSION  Grasp the subject's fingers and curl them into a fist and then straighten them out.

## 2019-07-25 NOTE — Therapy (Signed)
[facility] [place], [place], [address] Phone: [phone]   Fax:  [phone]  Occupational Therapy Evaluation  Patient Details  Name: Danielle [name] MRN: [id number] Date of Birth: May 02, 1932 Referring Provider (OT): Alysia Penna, MD   Encounter Date: 07/25/2019  OT End of Session - 07/25/19 1636    Visit Number  1    Number of Visits  4    Date for OT Re-Evaluation  08/23/19    Authorization Type  [employer] Medicare Copay $35 No visit limit.    Progress Note Due on Visit  10    OT Start Time  1515    OT Stop Time  1604    OT Time Calculation (min)  49 min    Activity Tolerance  Patient tolerated treatment well    Behavior During Therapy  WFL for tasks assessed/performed       Past Medical History:  Diagnosis Date  . Arthritis    "some joint pain once in awhile" (05/26/2017)  . Cataract   . CVA (cerebral vascular accident) ([place]) 2019  . Heart murmur   . History of kidney stones   . Hyperlipemia   . Hypertension   . Hypothyroidism (acquired)   . Osteoporosis   . Pneumothorax 02/05/2017   right-after fall  . Rib fractures 02/05/2017   right side  . Stroke ([place])   . Type II diabetes mellitus ([place])     Past Surgical History:  Procedure Laterality Date  . AUGMENTATION MAMMAPLASTY Bilateral   . COSMETIC SURGERY    . DILATION AND CURETTAGE OF UTERUS    . EYE SURGERY    . FRACTURE SURGERY    . JOINT REPLACEMENT    . REVERSE SHOULDER ARTHROPLASTY Right 05/26/2017  . REVERSE SHOULDER ARTHROPLASTY Right 05/26/2017   Procedure: REVERSE RIGHT SHOULDER ARTHROPLASTY;  Surgeon: Meredith Pel, MD;  Location: [facility];  Service: Orthopedics;  Laterality: Right;  . TONSILLECTOMY    . TUBAL LIGATION      There were no vitals filed for this visit.  Subjective Assessment - 07/25/19 1522    Subjective   S: I feel like my fingers are moving a little but they are weak.    Pertinent History  Patient is a 86 y/o female  S/P right MCA infarction in the setting of right ICA occludion and severe left ICA stenosis. She has been receiving outpatient OT services at this clinic for her recent CVA's in 04/2018 and 12/23/18. She was placed on hold due to a plateau in progress and minimal carry over of HEP at home. She received Botox injections 2 weeks ago to her left pectoralis, left biceps, and left forearm. Dr. Letta Pate has referred her to occupational therapy for evaluation and treatment.    Currently in Pain?  No/denies        [facility] OT Assessment - 07/25/19 1523      Assessment   Medical Diagnosis  Left arm weakness     Referring Provider (OT)  Alysia Penna, MD    Onset Date/Surgical Date  --   04/2018 and 01/03/2019   Hand Dominance  Right    Next MD Visit  08/25/2019    Prior Therapy  Pt received outpatient OT services at this clinic for previous two CVAs.      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 Her daughter assists with baths twice a week. She requires mod-max assistance for dressing depending on the clothing.       Mobility   Mobility Status  Needs assist      Written Expression   Dominant Hand  Right      Vision - History   Baseline Vision  No visual deficits      Cognition   Overall Cognitive Status  Within Functional Limits for tasks assessed      Coordination   Tremors  minimal tremors       AROM   AROM Assessment Site  Shoulder;Elbow;Forearm;Wrist;Finger    Right/Left Shoulder  Left    Left Shoulder Flexion  72 Degrees    Left Shoulder ABduction  65 Degrees   previous: 63   Right/Left Elbow  Left    Left Elbow Flexion  90   previous: 112   Left Elbow Extension  -7   previous: -32   Left Forearm Pronation  --   unable to perform   Left Forearm Supination  82 Degrees   previous: 75   Right/Left Wrist  Left    Left Wrist Extension  34 Degrees   previous: 15   Left Wrist Flexion  26 Degrees   previous: 15     PROM   Overall PROM Comments  Assessed seated. IR/er adducted    PROM Assessment Site  Shoulder    Right/Left Shoulder  Left    Left Shoulder Flexion  120 Degrees   previous: 135   Left Shoulder ABduction  120 Degrees   previous: 140   Left Shoulder Internal Rotation  90 Degrees   previous 90   Left Shoulder External Rotation  65 Degrees   previous: 80     Strength   Overall Strength Comments  Assessed seated.     Strength Assessment Site  Shoulder;Elbow;Forearm;Wrist;Hand    Left Shoulder Flexion  3-/5   previous: 1/5   Left Shoulder ABduction  3-/5   previous: 2-/5   Left Shoulder Internal Rotation  3+/5   with arm supported. previous: 1/5   Left Shoulder External Rotation  0/5   previous: 1/5   Left Elbow Flexion  1/5   previous: 3-/5   Left Elbow Extension  3+/5   previous: 3-/5   Left Wrist Flexion  1/5   previous: 3-/5   Left Wrist Extension  1/5   previous: 3-/5   Left Hand Grip (lbs)  0   previous: 0   Left Hand Lateral Pinch  4 lbs   previous: same                     OT Education - 07/25/19 1635    Education Details  P/ROM shoulder, elbow, wrist, and hand exercises. Pt is to complete once a day or more. 5-10 repetitions. Discussed only completing therapy once a week  for 4 weeks due to decrease in ROM and to work on increasing wrist and forearm since Botox.    Person(s) Educated  Patient;Spouse    Methods   Explanation;Handout    Comprehension  Verbalized understanding       OT Short Term Goals - 07/25/19 1642      OT SHORT TERM GOAL #1   Title  Patient and family will be educated and verbalize understanding of HEP for LUE stretching in order to carry over exercises to maintain ROM once discharged from therapy.    Time  4    Period  Weeks    Status  New    Target Date  08/23/19      OT SHORT TERM GOAL #2   Title  Patient will increase left shoulder passive and active ROM by 5 degrees in order to decrease difficulty during dressing tasks.    Time  4    Period  Weeks    Status  New      OT SHORT TERM GOAL #3   Title  Patient will increase left wrist active ROM by 10 degrees in order to stabilize her wrist during low level reaching and movement while attempting to provide a gross assist during ADL tasks.    Time  4    Period  Weeks    Status  New            Plan - 07/25/19 1638    Clinical Impression Statement  A: Patient is a 86 y/o female returning to clinic post BOTOX to LUE. Patient is experiencing decreased passive and active ROM in her left shoulder. Slight increase in A/ROM noted in wrist and forearm. Discussed only scheduling once a week to focus on ROM due to previous lack of progress and carry over of HEP at home. Pt is in agreement with recommendation.    OT Occupational Profile and History  Problem Focused Assessment - Including review of records relating to presenting problem    Occupational performance deficits (Please refer to evaluation for details):  ADL's;IADL's;Leisure    Body Structure / Function / Physical Skills  Balance;ADL;UE functional use;Fascial restriction;Flexibility;FMC;Proprioception;ROM;Coordination;GMC;Sensation;Decreased knowledge of use of DME;IADL;Strength;Edema;Tone    Rehab Potential  Fair    Clinical Decision Making  Several treatment options, min-mod task modification necessary    Comorbidities Affecting Occupational Performance:  Presence of  comorbidities impacting occupational performance    Comorbidities impacting occupational performance description:  History of CVAs. Decreased carry over of previous HEP at home per husband.    Modification or Assistance to Complete Evaluation   Min-Moderate modification of tasks or assist with assess necessary to complete eval    OT Frequency  1x / week    OT Duration  4 weeks    OT Treatment/Interventions  Self-care/ADL training;Ultrasound;Compression bandaging;Patient/family education;DME and/or AE instruction;Passive range of motion;Balance training;Cryotherapy;Electrical Stimulation;Moist Heat;Neuromuscular education;Therapeutic activities;Manual Therapy;Therapeutic exercise;Functional Mobility Training;Splinting    Plan  P: Patient will benefit from skilled OT services to increase functional mobility of her LUE in order to decrease the level of difficulty during basic ADL tasks when receiving assistance from family and husband. Treatment Plan: Focus on P/ROM of LUE, Complete A/ROM within available ROM, continue with NM re-ed and weightbearing techniques. Discharge at the end of 4 weeks with stretching program to be carried out at home by family.    OT Home Exercise Plan  Eval: P/ROM shoulder, elbow, wrist, and hand.    Consulted and Agree with Plan of Care  Patient;Family member/caregiver    Family Member Consulted  Husband: [name]       Patient will benefit from skilled therapeutic intervention in order to improve the following deficits and impairments:   Body Structure / Function / Physical Skills: Balance, ADL, UE functional use, Fascial restriction, Flexibility, FMC, Proprioception, ROM, Coordination, GMC, Sensation, Decreased knowledge of use of DME, IADL, Strength, Edema, Tone       Visit Diagnosis: Other lack of coordination  Other symptoms and signs involving the musculoskeletal system  Other symptoms and signs involving the nervous system    Problem List Patient Active  Problem List   Diagnosis Date Noted  . Osteoporosis 04/19/2019  . Encounter for screening mammogram for malignant neoplasm of breast 04/19/2019  . Hyperlipidemia 04/19/2019  . Right middle cerebral artery stroke ([place]) 01/03/2019  . Carotid stenosis, bilateral 12/30/2018  . Stroke ([place]) 12/24/2018  . Stroke-like symptoms 12/23/2018  . Generalized weakness 06/09/2018  . Hypothyroidism (acquired) 05/03/2018  . Hypertension 05/03/2018  . Uncontrolled type 2 diabetes mellitus with diabetic nephropathy, with long-term current use of insulin ([place])   . Back pain at L4-L5 level 06/14/2013  . Muscle weakness (generalized) 12/17/2012  . Pain in joint, shoulder region 12/17/2012  . Shoulder fracture 12/14/2012  . Proximal humerus fracture 11/29/2012  . FRACTURE, TOE, RIGHT 09/10/2009   Ailene Ravel, OTR/L,CBIS  [phone]  07/25/2019, 4:52 PM  [facility] [address] [place], [place], [address] Phone: [phone]   Fax:  [phone]  Name: DANYL WITBECK MRN: [id number] Date of Birth: 03-12-1932

## 2019-08-02 ENCOUNTER — Ambulatory Visit (HOSPITAL_COMMUNITY): Payer: Medicare Other | Admitting: Occupational Therapy

## 2019-08-02 ENCOUNTER — Encounter (HOSPITAL_COMMUNITY): Payer: Self-pay | Admitting: Occupational Therapy

## 2019-08-02 ENCOUNTER — Other Ambulatory Visit: Payer: Self-pay

## 2019-08-02 DIAGNOSIS — R278 Other lack of coordination: Secondary | ICD-10-CM | POA: Diagnosis not present

## 2019-08-02 DIAGNOSIS — R29898 Other symptoms and signs involving the musculoskeletal system: Secondary | ICD-10-CM

## 2019-08-02 NOTE — Therapy (Addendum)
[facility] [place], [place], [address] Phone: [phone]   Fax:  [phone]  Occupational Therapy Treatment  Patient Details  Name: Danielle [name] MRN: [id number] Date of Birth: [birth year] Referring Provider (OT): Alysia Penna, MD   Encounter Date: 08/02/2019  OT End of Session - 08/02/19 [id number]    Visit Number  2    Number of Visits  4    Date for OT Re-Evaluation  08/23/19    Authorization Type  [employer] Medicare Copay $35 No visit limit.    Progress Note Due on Visit  10    OT Start Time  1119    OT Stop Time  1157    OT Time Calculation (min)  38 min    Activity Tolerance  Patient tolerated treatment well    Behavior During Therapy  WFL for tasks assessed/performed       Past Medical History:  Diagnosis Date  . Arthritis    "some joint pain once in awhile" (05/26/2017)  . Cataract   . CVA (cerebral vascular accident) ([place]) 2019  . Heart murmur   . History of kidney stones   . Hyperlipemia   . Hypertension   . Hypothyroidism (acquired)   . Osteoporosis   . Pneumothorax 02/05/2017   right-after fall  . Rib fractures 02/05/2017   right side  . Stroke ([place])   . Type II diabetes mellitus ([place])     Past Surgical History:  Procedure Laterality Date  . AUGMENTATION MAMMAPLASTY Bilateral   . COSMETIC SURGERY    . DILATION AND CURETTAGE OF UTERUS    . EYE SURGERY    . FRACTURE SURGERY    . JOINT REPLACEMENT    . REVERSE SHOULDER ARTHROPLASTY Right 05/26/2017  . REVERSE SHOULDER ARTHROPLASTY Right 05/26/2017   Procedure: REVERSE RIGHT SHOULDER ARTHROPLASTY;  Surgeon: Meredith Pel, MD;  Location: [facility];  Service: Orthopedics;  Laterality: Right;  . TONSILLECTOMY    . TUBAL LIGATION      There were no vitals filed for this visit.  Subjective Assessment - 08/02/19 1116    Subjective   S: Danielle [name] and Danielle [name] have been stretching me.    Currently in Pain?  No/denies         [facility] OT Assessment -  08/02/19 1116      Assessment   Medical Diagnosis  Left arm weakness       Precautions   Precautions  Fall    Precaution Comments  left hemiparesis               OT Treatments/Exercises (OP) - 08/02/19 1131      Neurological Re-education Exercises   Shoulder Flexion  PROM;AAROM;10 reps    Shoulder ABduction  PROM;10 reps    Shoulder Protraction  PROM;AAROM;10 reps    Elbow Flexion  PROM;AROM;10 reps    Elbow Extension  PROM;AROM;10 reps    Forearm Supination  PROM;AROM;10 reps    Forearm Pronation  PROM;AROM;10 reps    Wrist Flexion  PROM;AROM;10 reps    Wrist Extension  PROM;AROM;10 reps    Wrist Radial Deviation  PROM;5 reps    Wrist Ulnar Deviation  PROM;5 reps    Other Exercises 1  Table wipe: Pt seated at table in gym, hand positioned on washcloth with target (dycem) positioned in front of her. Working on shoulder/elbow flexion and extension to touch target. Completed 10X, mod to max effort required to maintain in flexion plane versus horizontal  abduction plane.              OT Education - 08/02/19 1159    Education Details  place a cup on the table in front of pt and work on sliding arm forward to touch the cup    Person(s) Educated  Patient;Spouse    Methods  Explanation    Comprehension  Verbalized understanding       OT Short Term Goals - 08/02/19 1159      OT SHORT TERM GOAL #1   Title  Patient and family will be educated and verbalize understanding of HEP for LUE stretching in order to carry over exercises to maintain ROM once discharged from therapy.    Time  4    Period  Weeks    Status  On-going    Target Date  08/23/19      OT SHORT TERM GOAL #2   Title  Patient will increase left shoulder passive and active ROM by 5 degrees in order to decrease difficulty during dressing tasks.    Time  4    Period  Weeks    Status  On-going      OT SHORT TERM GOAL #3   Title  Patient will increase left wrist active ROM by 10 degrees in order to  stabilize her wrist during low level reaching and movement while attempting to provide a gross assist during ADL tasks.    Time  4    Period  Weeks    Status  On-going            Plan - 08/02/19 1135    Clinical Impression Statement  A: Pt reports family is helping her stretch at home. Pt with good tolerance for passive stretching today, achieving ROM WFL in all planes. Completed AA/ROM in supine to work on improving shoulder strength and stability required for A/ROM. Frequent cuing for form and technique required. Pt completed wrist ROM tasks seated, cuing for correct completion.    Body Structure / Function / Physical Skills  Balance;ADL;UE functional use;Fascial restriction;Flexibility;FMC;Proprioception;ROM;Coordination;GMC;Sensation;Decreased knowledge of use of DME;IADL;Strength;Edema;Tone    Plan  P: Continue with ROM stretching and target reaching tasks (touch an object in various planes)    OT Home Exercise Plan  Eval: P/ROM shoulder, elbow, wrist, and hand. 3/16: Place a cup on the table at home and work on sliding arm forward to touch the cup    Consulted and Agree with Plan of Care  Patient;Family member/caregiver    Family Member Consulted  Husband: Danielle [name]       Patient will benefit from skilled therapeutic intervention in order to improve the following deficits and impairments:   Body Structure / Function / Physical Skills: Balance, ADL, UE functional use, Fascial restriction, Flexibility, FMC, Proprioception, ROM, Coordination, GMC, Sensation, Decreased knowledge of use of DME, IADL, Strength, Edema, Tone       Visit Diagnosis: Other lack of coordination  Other symptoms and signs involving the musculoskeletal system    Problem List Patient Active Problem List   Diagnosis Date Noted  . Osteoporosis 04/19/2019  . Encounter for screening mammogram for malignant neoplasm of breast 04/19/2019  . Hyperlipidemia 04/19/2019  . Right middle cerebral artery stroke  ([place]) 01/03/2019  . Carotid stenosis, bilateral 12/30/2018  . Stroke ([place]) 12/24/2018  . Stroke-like symptoms 12/23/2018  . Generalized weakness 06/09/2018  . Hypothyroidism (acquired) 05/03/2018  . Hypertension 05/03/2018  . Uncontrolled type 2 diabetes mellitus with diabetic nephropathy, with long-term current use  of insulin ([place])   . Back pain at L4-L5 level 06/14/2013  . Muscle weakness (generalized) 12/17/2012  . Pain in joint, shoulder region 12/17/2012  . Shoulder fracture 12/14/2012  . Proximal humerus fracture 11/29/2012  . FRACTURE, TOE, RIGHT 09/10/2009   Guadelupe Sabin, OTR/L  [phone] 08/02/2019, 12:00 PM  [facility] [address] [place], [place], [address] Phone: [phone]   Fax:  [phone]  Name: Danielle [name] MRN: [id number] Date of Birth: [birth year]

## 2019-08-08 ENCOUNTER — Other Ambulatory Visit: Payer: Self-pay | Admitting: *Deleted

## 2019-08-08 DIAGNOSIS — I6523 Occlusion and stenosis of bilateral carotid arteries: Secondary | ICD-10-CM

## 2019-08-09 ENCOUNTER — Ambulatory Visit (HOSPITAL_COMMUNITY)
Admission: RE | Admit: 2019-08-09 | Discharge: 2019-08-09 | Disposition: A | Discharge status: Home / Self Care | Payer: Medicare Other | Source: Ambulatory Visit | Attending: Vascular Surgery | Admitting: Vascular Surgery

## 2019-08-09 ENCOUNTER — Other Ambulatory Visit: Payer: Self-pay

## 2019-08-09 ENCOUNTER — Encounter: Payer: Self-pay | Admitting: Vascular Surgery

## 2019-08-09 ENCOUNTER — Encounter (HOSPITAL_COMMUNITY): Payer: Self-pay | Admitting: Occupational Therapy

## 2019-08-09 ENCOUNTER — Ambulatory Visit (HOSPITAL_COMMUNITY): Payer: Medicare Other | Admitting: Occupational Therapy

## 2019-08-09 ENCOUNTER — Ambulatory Visit (INDEPENDENT_AMBULATORY_CARE_PROVIDER_SITE_OTHER): Payer: Medicare Other | Admitting: Vascular Surgery

## 2019-08-09 VITALS — BP 150/78 | HR 87 | Temp 96.1°F | Resp 14 | Ht 65.0 in | Wt 137.0 lb

## 2019-08-09 DIAGNOSIS — I6523 Occlusion and stenosis of bilateral carotid arteries: Secondary | ICD-10-CM | POA: Diagnosis not present

## 2019-08-09 DIAGNOSIS — R29898 Other symptoms and signs involving the musculoskeletal system: Secondary | ICD-10-CM

## 2019-08-09 DIAGNOSIS — R278 Other lack of coordination: Secondary | ICD-10-CM

## 2019-08-09 NOTE — Progress Notes (Signed)
Patient name: Danielle [name] MRN: [id number] DOB: 08/27/1931 Sex: female  REASON FOR VISIT: Follow-up for carotid disease in setting of R carotid occlusion and left ICA moderate stenosis  HPI: SHANNYN [name] is a 86 y.o. female that presents for 6 month  follow-up for ongoing surveillance for carotid disease.  She was initially seen in the hospital for right-sided CVA with fairly profound left arm hemiparesis.  Vascular surgery was called for bilateral string signs of the ICAs.  On the right which was the side of interest, she had a distal occlusion of the ICA in the carotid siphon and her duplex showed a thump signal.  As a result I did not offer revascularization given distal occlusion and ultimately her right ICA occluded down to bifurcation.  Left ICA has been followed for moderate stenosis.  On follow-up today she has had no new neurologic events.  Still has profound weakness in the left arm and going  to therapy.  No new vision loss.  No right-sided symptoms.  At her neurologic baseline since hospital discharge.    Past Medical History:  Diagnosis Date  . Arthritis    "some joint pain once in awhile" (05/26/2017)  . Cataract   . CVA (cerebral vascular accident) (Danielle [name]) 2019  . Heart murmur   . History of kidney stones   . Hyperlipemia   . Hypertension   . Hypothyroidism (acquired)   . Osteoporosis   . Pneumothorax 02/05/2017   right-after fall  . Rib fractures 02/05/2017   right side  . Stroke (Danielle [name])   . Type II diabetes mellitus (Danielle [name])     Past Surgical History:  Procedure Laterality Date  . AUGMENTATION MAMMAPLASTY Bilateral   . COSMETIC SURGERY    . DILATION AND CURETTAGE OF UTERUS    . EYE SURGERY    . FRACTURE SURGERY    . JOINT REPLACEMENT    . REVERSE SHOULDER ARTHROPLASTY Right 05/26/2017  . REVERSE SHOULDER ARTHROPLASTY Right 05/26/2017   Procedure: REVERSE RIGHT SHOULDER ARTHROPLASTY;  Surgeon: Danielle Pel, MD;  Location: [facility];  Service:  Orthopedics;  Laterality: Right;  . TONSILLECTOMY    . TUBAL LIGATION      Family History  Problem Relation Age of Onset  . Cancer Mother   . Heart disease Father     SOCIAL HISTORY: Social History   Tobacco Use  . Smoking status: Former Smoker    Packs/day: 1.00    Years: 40.00    Pack years: 40.00    Types: Cigarettes    Quit date: 1991    Years since quitting: 30.2  . Smokeless tobacco: Never Used  Substance Use Topics  . Alcohol use: No    Allergies  Allergen Reactions  . Fosamax [Alendronate Sodium] Other (See Comments)    MUSCLE ACHES  . Prednisone     Feels jittery and nauseous    Current Outpatient Medications  Medication Sig Dispense Refill  . acetaminophen (TYLENOL) 325 MG tablet Take 2 tablets (650 mg total) by mouth every 6 (six) hours as needed for mild pain (or Fever >/= 101).    Marland Kitchen amLODipine (NORVASC) 5 MG tablet Take 1 tablet (5 mg total) by mouth daily. 30 tablet 11  . aspirin 325 MG tablet Take 1 tablet (325 mg total) by mouth daily. 30 tablet 12  . atorvastatin (LIPITOR) 80 MG tablet Take 1 tablet (80 mg total) by mouth daily at 6 PM. 30 tablet 0  . Calcium Carb-Cholecalciferol (CALCIUM 600 +  D PO) Take 1 tablet by mouth daily.    . Calcium Carbonate-Vitamin D (CALCIUM-VITAMIN D3 PO) Take 600 mg by mouth.    . clopidogrel (PLAVIX) 75 MG tablet Take 75 mg by mouth daily.    . diclofenac sodium (VOLTAREN) 1 % GEL Apply 2 g topically 4 (four) times daily. 2 g 0  . Insulin Glargine, 2 Unit Dial, (TOUJEO MAX SOLOSTAR) 300 UNIT/ML SOPN Inject 30 Units into the skin daily before breakfast.     . Insulin Lispro (HUMALOG KWIKPEN) 200 UNIT/ML SOPN Inject into the skin. 10-12 units BID    . Multiple Vitamins-Minerals (OCUVITE ADULT 50+ PO) Take by mouth.    . multivitamin (PROSIGHT) TABS tablet Take 1 tablet by mouth daily. 30 tablet 0  . senna-docusate (SENOKOT-S) 8.6-50 MG tablet Take 2 tablets by mouth at bedtime. (Patient taking differently: Take 2 tablets  by mouth at bedtime as needed. )    . SYNTHROID 100 MCG tablet Take 1 tablet (100 mcg total) by mouth every morning. 30 tablet 0  . vitamin B-12 (CYANOCOBALAMIN) 1000 MCG tablet Take 1 tablet (1,000 mcg total) by mouth daily. 30 tablet 0   No current facility-administered medications for this visit.    REVIEW OF SYSTEMS:  [X]  denotes positive finding, [ ]  denotes negative finding Cardiac  Comments:  Chest pain or chest pressure:    Shortness of breath upon exertion:    Short of breath when lying flat:    Irregular heart rhythm:        Vascular    Pain in calf, thigh, or hip brought on by ambulation:    Pain in feet at night that wakes you up from your sleep:     Blood clot in your veins:    Leg swelling:         Pulmonary    Oxygen at home:    Productive cough:     Wheezing:         Neurologic    Sudden weakness in arms or legs:     Sudden numbness in arms or legs:     Sudden onset of difficulty speaking or slurred speech:    Temporary loss of vision in one eye:     Problems with dizziness:         Gastrointestinal    Blood in stool:     Vomited blood:         Genitourinary    Burning when urinating:     Blood in urine:        Psychiatric    Major depression:         Hematologic    Bleeding problems:    Problems with blood clotting too easily:        Skin    Rashes or ulcers:        Constitutional    Fever or chills:      PHYSICAL EXAM: Vitals:   08/09/19 1445  BP: (!) 150/78  Pulse: 87  Resp: 14  Temp: (!) 96.1 F (35.6 C)  TempSrc: Temporal  SpO2: 99%  Weight: 137 lb (62.1 kg)  Height: 5\' 5"  (1.651 m)    GENERAL: The patient is a well-nourished female, in no acute distress. The vital signs are documented above. CARDIAC: There is a regular rate and rhythm.  PULMONARY: There is good air exchange bilaterally without wheezing or rales. ABDOMEN: Soft and non-tender with normal pitched bowel sounds.  MUSCULOSKELETAL: There are no major deformities  or  cyanosis. NEUROLOGIC: Left arm 1/5. Left leg 4/5.  Right arm and leg neurologically intact.  CN II-XII grossly intact.    DATA:   Repeat carotid duplex today again demonstrates right carotid occlusion and stable moderate stenosis on the left ICA in the 60 to 79% range.  Assessment/Plan:  86 year old female presents for ongoing surveillance of her carotid disease.  As previously noted in the hospital consult we did not offer her right revascularization given that there was evidence of a distal right carotid siphon occlusion and there was no benefit to revascularization.  As discussed with husband at the time had already had neurologic event and revascularization would not reverse neurologic deficit.  Went on to occluded right carotid down to bifurcation.  We are now following a left moderate 60 to 79% stenosis.  She's had no additional neurologic events or new symptoms over the last 6 months.  She has had mild progression of her carotid disease on the left but velocities are still in the 60 to 79% range.  Discussed that we would again not offer any revascularization unless she has more than 80% stenosis in the setting of asymptomatic disease.  I will see her again in 6 months for ongoing surveillance.   Marty Heck, MD [facility] Office: [phone]

## 2019-08-09 NOTE — Therapy (Signed)
[place], [place], [address] Phone: [phone]   Fax:  [phone]  Patient Details  Name: Danielle [name] MRN: [id number] Date of Birth: May 01, 1932 Referring Provider:  Maryruth Hancock, MD  Encounter Date: 08/09/2019  Cancellation: Pt arrived for therapy, noted to be leaning against husband while seated in waiting area. Pt reports feeling tired, on the way to gym OT notes pt seemingly lethargic. Pt reports feeling woozy and lightheaded. Pt asked to lay down on mat, blood pressure at 125/65 and HR at 80. Pt reports blood sugar was in the 100s this am. Husband brought in from waiting room, reports pt ate at 4:30am and has had 3 different shots since then with no food.  Pt provided with Gingerale and crackers, drank a few sips and ate 3 crackers. Pt seemingly tired and closing her eyes while eating, immediately opens them and responds to name and questions. Husband requested wheelchair to assist pt to the car. Educated on seeking medical attention if pt does not continue to improve with a meal. Pt able to stand up and transfer into car with minimal assist from husband. No skilled OT services provided this date, session will be a no charge visit.   Guadelupe Sabin, OTR/L  [phone] 08/09/2019, 11:51 AM  [place], [place], [address] Phone: [phone]   Fax:  [phone]

## 2019-08-10 ENCOUNTER — Other Ambulatory Visit: Payer: Self-pay | Admitting: *Deleted

## 2019-08-10 DIAGNOSIS — I6523 Occlusion and stenosis of bilateral carotid arteries: Secondary | ICD-10-CM

## 2019-08-15 ENCOUNTER — Other Ambulatory Visit: Payer: Self-pay | Admitting: Family Medicine

## 2019-08-16 ENCOUNTER — Encounter (HOSPITAL_COMMUNITY): Payer: Self-pay

## 2019-08-16 ENCOUNTER — Other Ambulatory Visit: Payer: Self-pay

## 2019-08-16 ENCOUNTER — Ambulatory Visit (HOSPITAL_COMMUNITY): Payer: Medicare Other

## 2019-08-16 DIAGNOSIS — R29818 Other symptoms and signs involving the nervous system: Secondary | ICD-10-CM

## 2019-08-16 DIAGNOSIS — R278 Other lack of coordination: Secondary | ICD-10-CM

## 2019-08-16 DIAGNOSIS — R29898 Other symptoms and signs involving the musculoskeletal system: Secondary | ICD-10-CM

## 2019-08-16 NOTE — Therapy (Signed)
[place], [place], [address] Phone: [phone]   Fax:  [phone]  Occupational Therapy Treatment  Patient Details  Name: Danielle [name] MRN: [id number] Date of Birth: [birth year] Referring Provider (OT): Alysia Penna, MD   Encounter Date: 08/16/2019  OT End of Session - 08/16/19 1525    Visit Number  3    Number of Visits  4    Date for OT Re-Evaluation  08/23/19    Authorization Type  [employer] Medicare Copay $35 No visit limit.    Progress Note Due on Visit  10    OT Start Time  1345    OT Stop Time  1423    OT Time Calculation (min)  38 min    Activity Tolerance  Patient tolerated treatment well    Behavior During Therapy  WFL for tasks assessed/performed       Past Medical History:  Diagnosis Date  . Arthritis    "some joint pain once in awhile" (05/26/2017)  . Cataract   . CVA (cerebral vascular accident) ([facility]) 2019  . Heart murmur   . History of kidney stones   . Hyperlipemia   . Hypertension   . Hypothyroidism (acquired)   . Osteoporosis   . Pneumothorax 02/05/2017   right-after fall  . Rib fractures 02/05/2017   right side  . Stroke ([facility])   . Type II diabetes mellitus ([facility])     Past Surgical History:  Procedure Laterality Date  . AUGMENTATION MAMMAPLASTY Bilateral   . COSMETIC SURGERY    . DILATION AND CURETTAGE OF UTERUS    . EYE SURGERY    . FRACTURE SURGERY    . JOINT REPLACEMENT    . REVERSE SHOULDER ARTHROPLASTY Right 05/26/2017  . REVERSE SHOULDER ARTHROPLASTY Right 05/26/2017   Procedure: REVERSE RIGHT SHOULDER ARTHROPLASTY;  Surgeon: Meredith Pel, MD;  Location: [facility];  Service: Orthopedics;  Laterality: Right;  . TONSILLECTOMY    . TUBAL LIGATION      There were no vitals filed for this visit.  Subjective Assessment - 08/16/19 1520    Subjective   S: Beth and [name] have been stretching me a lot. They sometimes stretch too far and it hurts though.    Currently in Pain?  No/denies         [facility] OT Assessment - 08/16/19 1521      Assessment   Medical Diagnosis  Left arm weakness       Precautions   Precautions  Fall    Precaution Comments  left hemiparesis               OT Treatments/Exercises (OP) - 08/16/19 1521      Exercises   Exercises  Shoulder      Neurological Re-education Exercises   Shoulder Flexion  AROM;Seated;5 reps;Other (comment)   2 sets; attempted to hold arm up in flexion for 2-3 seconds   Shoulder Protraction  AAROM;10 reps;Seated   hand positioned on pink ball then on triangle wedge   Forearm Supination  AAROM;5 reps;Seated   Holding small PVC pipe for visual feedback   Forearm Pronation  AAROM;5 reps;Seated   Holding small pvc pipe for visual feedback   Wrist Flexion  AROM;10 reps;Seated    Other Exercises 1  Vibration through a medium used  on Left hand while positioned on small pink ball to help decrease tone and allow hand to open in a more relaxed position at rest,  With therapist holding hand on ball, worked on forward/back movements and side to side movements to increase wrist ROM.                OT Short Term Goals - 08/02/19 1159      OT SHORT TERM GOAL #1   Title  Patient and family will be educated and verbalize understanding of HEP for LUE stretching in order to carry over exercises to maintain ROM once discharged from therapy.    Time  4    Period  Weeks    Status  On-going    Target Date  08/23/19      OT SHORT TERM GOAL #2   Title  Patient will increase left shoulder passive and active ROM by 5 degrees in order to decrease difficulty during dressing tasks.    Time  4    Period  Weeks    Status  On-going      OT SHORT TERM GOAL #3   Title  Patient will increase left wrist active ROM by 10 degrees in order to stabilize her wrist during low level reaching and movement while attempting to provide a gross assist during ADL tasks.    Time  4    Period  Weeks     Status  On-going          Plan - 08/16/19 1526    Clinical Impression Statement  A: Session focused on wrist, forearm, and shoulder active movement while reaching up against gravity as well as active assisted movements. VC for form and technique provided especially for seated posture and arm positioning.    Body Structure / Function / Physical Skills  Balance;ADL;UE functional use;Fascial restriction;Flexibility;FMC;Proprioception;ROM;Coordination;GMC;Sensation;Decreased knowledge of use of DME;IADL;Strength;Edema;Tone    Plan  P: Reassessment and discharge. Review and complete any education regarding HEP and carry over at home.    Consulted and Agree with Plan of Care  Patient       Patient will benefit from skilled therapeutic intervention in order to improve the following deficits and impairments:   Body Structure / Function / Physical Skills: Balance, ADL, UE functional use, Fascial restriction, Flexibility, FMC, Proprioception, ROM, Coordination, GMC, Sensation, Decreased knowledge of use of DME, IADL, Strength, Edema, Tone       Visit Diagnosis: Other symptoms and signs involving the nervous system  Other symptoms and signs involving the musculoskeletal system  Other lack of coordination    Problem List Patient Active Problem List   Diagnosis Date Noted  . Osteoporosis 04/19/2019  . Encounter for screening mammogram for malignant neoplasm of breast 04/19/2019  . Hyperlipidemia 04/19/2019  . Right middle cerebral artery stroke ([facility]) 01/03/2019  . Carotid stenosis, bilateral 12/30/2018  . Stroke ([facility]) 12/24/2018  . Stroke-like symptoms 12/23/2018  . Generalized weakness 06/09/2018  . Hypothyroidism (acquired) 05/03/2018  . Hypertension 05/03/2018  . Uncontrolled type 2 diabetes mellitus with diabetic nephropathy, with long-term current use of insulin ([facility])   . Back pain at L4-L5 level 06/14/2013  . Muscle weakness (generalized) 12/17/2012  . Pain in joint, shoulder  region 12/17/2012  . Shoulder fracture 12/14/2012  . Proximal humerus fracture 11/29/2012  . FRACTURE, TOE, RIGHT 09/10/2009   Ailene Ravel, OTR/L,CBIS  [phone]  08/16/2019, 3:29 PM  [facility] [address] [place], [place], [address] Phone: [phone]   Fax:  [phone]  Name: Danielle [name] MRN: [id number] Date of Birth: 1931-12-27

## 2019-08-18 ENCOUNTER — Encounter: Payer: Medicare Other | Attending: Registered Nurse | Admitting: Physical Medicine & Rehabilitation

## 2019-08-18 ENCOUNTER — Other Ambulatory Visit: Payer: Self-pay

## 2019-08-18 ENCOUNTER — Encounter: Payer: Self-pay | Admitting: Physical Medicine & Rehabilitation

## 2019-08-18 VITALS — BP 158/83 | HR 86 | Ht 65.0 in | Wt 139.0 lb

## 2019-08-18 DIAGNOSIS — E7849 Other hyperlipidemia: Secondary | ICD-10-CM | POA: Insufficient documentation

## 2019-08-18 DIAGNOSIS — E1121 Type 2 diabetes mellitus with diabetic nephropathy: Secondary | ICD-10-CM | POA: Diagnosis present

## 2019-08-18 DIAGNOSIS — I6523 Occlusion and stenosis of bilateral carotid arteries: Secondary | ICD-10-CM | POA: Insufficient documentation

## 2019-08-18 DIAGNOSIS — Z794 Long term (current) use of insulin: Secondary | ICD-10-CM | POA: Insufficient documentation

## 2019-08-18 DIAGNOSIS — E039 Hypothyroidism, unspecified: Secondary | ICD-10-CM | POA: Diagnosis present

## 2019-08-18 DIAGNOSIS — I10 Essential (primary) hypertension: Secondary | ICD-10-CM | POA: Diagnosis present

## 2019-08-18 DIAGNOSIS — E1165 Type 2 diabetes mellitus with hyperglycemia: Secondary | ICD-10-CM | POA: Diagnosis present

## 2019-08-18 DIAGNOSIS — G8110 Spastic hemiplegia affecting unspecified side: Secondary | ICD-10-CM

## 2019-08-18 DIAGNOSIS — I63511 Cerebral infarction due to unspecified occlusion or stenosis of right middle cerebral artery: Secondary | ICD-10-CM | POA: Diagnosis present

## 2019-08-18 NOTE — Patient Instructions (Signed)
Will repeat same dose of botox in the same muscles in 6 wks

## 2019-08-18 NOTE — Progress Notes (Signed)
Subjective:    Patient ID: Danielle [name], female    DOB: 1932-04-15, 86 y.o.   MRN: [id number]  HPI  86 yo female with Left spastic hemiparesis due to current right MCA infarct most recently on 12/23/2018.  She has had primarily finger wrist and elbow flexor spasticity as well as some spasticity in the pectoralis limiting left shoulder movement elbow and hand and wrist movements. The patient has been working after her botulinum toxin injection to stretch out the fingers .  She had no abnormal bruising after the botulinum toxin injection Botox 07/14/2019 Left FDS 50 units Left FDP 25 units Left FCR 50 units  Left biceps 50 units  Pectoralis 125 units left Pain Inventory Average Pain 0 Pain Right Now 0 My pain is  no pain  In the last 24 hours, has pain interfered with the following? General activity 0 Relation with others 0 Enjoyment of life 0 What TIME of day is your pain at its worst?  no pain Sleep (in general)  no pain  Pain is worse with:  no pain Pain improves with:  no pain Relief from Meds:  no pain  Mobility walk with assistance use a cane how many minutes can you walk? 5 ability to climb steps?  yes do you drive?  no use a wheelchair needs help with transfers  Function disabled: date disabled 12/2004 I need assistance with the following:  bathing, meal prep, household duties and shopping  Neuro/Psych weakness numbness trouble walking  Prior Studies Any changes since last visit?  no  Physicians involved in your care Any changes since last visit?  no   Family History  Problem Relation Age of Onset  . Cancer Mother   . Heart disease Father    Social History   Socioeconomic History  . Marital status: Married    Spouse name: Not on file  . Number of children: Not on file  . Years of education: Not on file  . Highest education level: Not on file  Occupational History  . Occupation: retired  Tobacco Use  . Smoking status: Former Smoker      Packs/day: 1.00    Years: 40.00    Pack years: 40.00    Types: Cigarettes    Quit date: 1991    Years since quitting: 30.2  . Smokeless tobacco: Never Used  Substance and Sexual Activity  . Alcohol use: No  . Drug use: No  . Sexual activity: Yes  Other Topics Concern  . Not on file  Social History Narrative  . Not on file   Social Determinants of Health   Financial Resource Strain:   . Difficulty of Paying Living Expenses:   Food Insecurity:   . Worried About Charity fundraiser in the Last Year:   . Arboriculturist in the Last Year:   Transportation Needs:   . Film/video editor (Medical):   Marland Kitchen Lack of Transportation (Non-Medical):   Physical Activity:   . Days of Exercise per Week:   . Minutes of Exercise per Session:   Stress:   . Feeling of Stress :   Social Connections:   . Frequency of Communication with Friends and Family:   . Frequency of Social Gatherings with Friends and Family:   . Attends Religious Services:   . Active Member of Clubs or Organizations:   . Attends Archivist Meetings:   Marland Kitchen Marital Status:    Past Surgical History:  Procedure Laterality  Date  . AUGMENTATION MAMMAPLASTY Bilateral   . COSMETIC SURGERY    . DILATION AND CURETTAGE OF UTERUS    . EYE SURGERY    . FRACTURE SURGERY    . JOINT REPLACEMENT    . REVERSE SHOULDER ARTHROPLASTY Right 05/26/2017  . REVERSE SHOULDER ARTHROPLASTY Right 05/26/2017   Procedure: REVERSE RIGHT SHOULDER ARTHROPLASTY;  Surgeon: Meredith Pel, MD;  Location: [facility];  Service: Orthopedics;  Laterality: Right;  . TONSILLECTOMY    . TUBAL LIGATION     Past Medical History:  Diagnosis Date  . Arthritis    "some joint pain once in awhile" (05/26/2017)  . Cataract   . CVA (cerebral vascular accident) ([facility]) 2019  . Heart murmur   . History of kidney stones   . Hyperlipemia   . Hypertension   . Hypothyroidism (acquired)   . Osteoporosis   . Pneumothorax 02/05/2017   right-after fall  .  Rib fractures 02/05/2017   right side  . Stroke ([facility])   . Type II diabetes mellitus ([facility])    BP (!) 158/83   Pulse 86   Ht 5\' 5"  (1.651 m)   Wt 139 lb (63 kg)   SpO2 96%   BMI 23.13 kg/m   Opioid Risk Score:   Fall Risk Score:  `1  Depression screen PHQ 2/9  Depression screen [facility] 2/9 08/18/2019 05/26/2019 02/02/2019  Decreased Interest 1 1 1   Down, Depressed, Hopeless 1 1 0  PHQ - 2 Score 2 2 1   Altered sleeping - - 2  Tired, decreased energy - - 2  Change in appetite - - 0  Feeling bad or failure about yourself  - - 0  Trouble concentrating - - 1  Moving slowly or fidgety/restless - - 0  Suicidal thoughts - - 0  PHQ-9 Score - - 6  Difficult doing work/chores - - Very difficult    Review of Systems  Constitutional: Negative.   HENT: Negative.   Eyes: Negative.   Respiratory: Negative.   Cardiovascular: Positive for leg swelling.  Endocrine: Negative.        High and low blood sugars  Genitourinary: Negative.   Musculoskeletal: Positive for gait problem.  Skin: Negative.   Allergic/Immunologic: Negative.   Neurological: Positive for weakness and numbness.  Hematological: Negative.   Psychiatric/Behavioral: Negative.   All other systems reviewed and are negative.      Objective:   Physical Exam Vitals and nursing note reviewed.  Constitutional:      Appearance: Normal appearance.  Eyes:     Extraocular Movements: Extraocular movements intact.     Conjunctiva/sclera: Conjunctivae normal.     Pupils: Pupils are equal, round, and reactive to light.  Musculoskeletal:     Comments: No pain with left shoulder range of motion left elbow range of motion left wrist and hand range of motion  Skin:    General: Skin is warm and dry.  Neurological:     General: No focal deficit present.     Mental Status: She is alert and oriented to person, place, and time.     Gait: Gait abnormal.     Comments: Tone MAS 2 in the left finger flexors MAS 1 in the left wrist flexors  MAS 1 in the left elbow flexors MAS 1 in the in the left pectoralis. Motor strength 3 - at the left deltoid biceps triceps to minus in the finger flexors and extensors. Left lower extremity strength 3 - left hip flexor 4 -  knee extensor 3 - ankle dorsiflexor  Psychiatric:        Mood and Affect: Mood normal.        Behavior: Behavior normal.        Thought Content: Thought content normal.        Judgment: Judgment normal.           Assessment & Plan:  #1.  Left spastic hemiparesis secondary to right MCA distribution infarct.  Improvement with tone in the left upper extremity status post Botox injection.  I would recommend repeating the same muscle group selection as well as same dosing in 6 weeks. Continue home exercise program Botox 07/14/2019 Left FDS 50 units Left FDP 25 units Left FCR 50 units  Left biceps 50 units  Pectoralis 125 units left

## 2019-08-22 ENCOUNTER — Encounter: Payer: Self-pay | Admitting: Family Medicine

## 2019-08-23 ENCOUNTER — Encounter (HOSPITAL_COMMUNITY): Payer: Self-pay

## 2019-08-23 ENCOUNTER — Other Ambulatory Visit: Payer: Self-pay

## 2019-08-23 ENCOUNTER — Ambulatory Visit (HOSPITAL_COMMUNITY): Payer: Medicare Other | Attending: Pulmonary Disease

## 2019-08-23 DIAGNOSIS — R29818 Other symptoms and signs involving the nervous system: Secondary | ICD-10-CM | POA: Diagnosis present

## 2019-08-23 DIAGNOSIS — R278 Other lack of coordination: Secondary | ICD-10-CM

## 2019-08-23 DIAGNOSIS — R29898 Other symptoms and signs involving the musculoskeletal system: Secondary | ICD-10-CM | POA: Insufficient documentation

## 2019-08-23 NOTE — Patient Instructions (Signed)
Instructions for Home . Continue to complete Left arm stretching program 2-3 times a day; every day to maintain the range of motion in the left arm.  . Continue wearing the brace at night to help keep fingers from tightening more. . After the next course of Botox on May 25th, 2021 wait to stretch the arm for 14 days to allow the Botox to take effect. Then at 14 days, continue with shoulder, elbow, wrist, and hand stretching.    The following exercises are good ones to try to do daily or as able. Once a day. Flexion Table Slide  Slide the towel forward on the table or countertop. Don't lean forward or into the table. Complete for 1 minute.       Table Slide Abduction  While seated, have the arm rested on a table in front of you. From the starting position, slide the arm out to the side. Try to focus on the shoulder blade performing most of the motion. After sliding the out to the side, slide the arm back to the starting position and repeat as stated.  May need a little bit of physical guidance to guide the left arm out versus pulling in. Complete for 1 minute.

## 2019-08-23 NOTE — Therapy (Signed)
[place], [place], [address] Phone: [phone]   Fax:  [phone]  Occupational Therapy Treatment Reassessment/discharge Patient Details  Name: Danielle [name] MRN: [id number] Date of Birth: [birth year] Referring Provider (OT): Alysia Penna, MD   Encounter Date: 08/23/2019  OT End of Session - 08/23/19 1601    Visit Number  4    Number of Visits  4    Date for OT Re-Evaluation  08/23/19    Authorization Type  [employer] Medicare Copay $35 No visit limit.    Progress Note Due on Visit  10    OT Start Time  1345   reassess and discharge   OT Stop Time  1423    OT Time Calculation (min)  38 min    Activity Tolerance  Patient tolerated treatment well    Behavior During Therapy  WFL for tasks assessed/performed       Past Medical History:  Diagnosis Date  . Arthritis    "some joint pain once in awhile" (05/26/2017)  . Cataract   . CVA (cerebral vascular accident) ([place]) 2019  . Heart murmur   . History of kidney stones   . Hyperlipemia   . Hypertension   . Hypothyroidism (acquired)   . Osteoporosis   . Pneumothorax 02/05/2017   right-after fall  . Rib fractures 02/05/2017   right side  . Stroke ([place])   . Type II diabetes mellitus ([place])     Past Surgical History:  Procedure Laterality Date  . AUGMENTATION MAMMAPLASTY Bilateral   . COSMETIC SURGERY    . DILATION AND CURETTAGE OF UTERUS    . EYE SURGERY    . FRACTURE SURGERY    . JOINT REPLACEMENT    . REVERSE SHOULDER ARTHROPLASTY Right 05/26/2017  . REVERSE SHOULDER ARTHROPLASTY Right 05/26/2017   Procedure: REVERSE RIGHT SHOULDER ARTHROPLASTY;  Surgeon: Meredith Pel, MD;  Location: [facility];  Service: Orthopedics;  Laterality: Right;  . TONSILLECTOMY    . TUBAL LIGATION      There were no vitals filed for this visit.  Subjective Assessment - 08/23/19 1351    Subjective   S: I'm hoping by December I'll be able to use this arm a lot more.     Currently in Pain?  No/denies         [facility] OT Assessment - 08/23/19 1351      Assessment   Medical Diagnosis  Left arm weakness       Precautions   Precautions  Fall    Precaution Comments  left hemiparesis      ROM / Strength   AROM / PROM / Strength  AROM;PROM      AROM   AROM Assessment Site  Shoulder;Wrist    Left Shoulder Flexion  82 Degrees   previous: 72   Right/Left Wrist  Left    Left Wrist Extension  46 Degrees   previous: 34   Left Wrist Flexion  44 Degrees   previous: 26     PROM   Overall PROM Comments  Assessed seated. IR/er adducted    PROM Assessment Site  Shoulder    Right/Left Shoulder  Left    Left Shoulder Flexion  134 Degrees   previous: 120              OT Treatments/Exercises (OP) - 08/23/19 1600      Neurological Re-education Exercises   Shoulder Flexion  AAROM;Seated   1' with physical cues  to prevent UB movement   Shoulder Horizontal ABduction  AAROM;Seated   1' with physical assist to prevent UB movement   Wrist Flexion  AROM;10 reps;Seated    Wrist Extension  AROM;10 reps;Seated    Finger Extension  P/ROM 10X             OT Education - 08/23/19 1558    Education Details  Reviewed therapy goals. Reviewed HEP. Instructions provided for continuing home program for LUE stretching. Added table slides: Flexion and horizontal abduction.    Person(s) Educated  Patient;Spouse    Methods  Explanation;Handout    Comprehension  Verbalized understanding;Returned demonstration       OT Short Term Goals - 08/23/19 1359      OT SHORT TERM GOAL #1   Title  Patient and family will be educated and verbalize understanding of HEP for LUE stretching in order to carry over exercises to maintain ROM once discharged from therapy.    Time  4    Period  Weeks    Status  Achieved    Target Date  08/23/19      OT SHORT TERM GOAL #2   Title  Patient will increase left shoulder passive and active ROM by 5 degrees in order to decrease  difficulty during dressing tasks.    Time  4    Period  Weeks    Status  Achieved      OT SHORT TERM GOAL #3   Title  Patient will increase left wrist active ROM by 10 degrees in order to stabilize her wrist during low level reaching and movement while attempting to provide a gross assist during ADL tasks.    Time  4    Period  Weeks    Status  Achieved           Plan - 08/23/19 1602    Clinical Impression Statement  A: Reassessment and discharge completed this date. patient has met all therapy goals. Patient, spouse, and daughter have been educated and independent with HEP involving stretching to the LUE (shoulder, elbow, wrist, and hand). Patient has been educated on the importance of maintaining program consistantly to prevent degression. Patient has increased her passive and active shoulder flexion by 10 degrees and wrist flexion and extension A/ROM has improved by 15 degrees or more. Increased tone continues to limit the movement/mobility of her LUE. patient is unable to use her Left arm for any daily tasks including as a stabilizer. Consistent verbal cues were provided during sessions to try to move the left UE first before moving it with the right, etc. All education was completed this date including rest period when receiving Botox and when to continue stretching. Patient and spouse verbalized understanding.    Body Structure / Function / Physical Skills  Balance;ADL;UE functional use;Fascial restriction;Flexibility;FMC;Proprioception;ROM;Coordination;GMC;Sensation;Decreased knowledge of use of DME;IADL;Strength;Edema;Tone    Plan  P: discharge from OT services with home exercise program: stretching program. Educated on use of nighttime splint and positioning of LUE during meal times.    Consulted and Agree with Plan of Care  Patient;Family member/caregiver    Family Member Consulted  Husband: [name]       Patient will benefit from skilled therapeutic intervention in order to  improve the following deficits and impairments:   Body Structure / Function / Physical Skills: Balance, ADL, UE functional use, Fascial restriction, Flexibility, FMC, Proprioception, ROM, Coordination, GMC, Sensation, Decreased knowledge of use of DME, IADL, Strength, Edema, Tone  Visit Diagnosis: Other symptoms and signs involving the nervous system  Other symptoms and signs involving the musculoskeletal system  Other lack of coordination    Problem List Patient Active Problem List   Diagnosis Date Noted  . Osteoporosis 04/19/2019  . Encounter for screening mammogram for malignant neoplasm of breast 04/19/2019  . Hyperlipidemia 04/19/2019  . 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 Carotid stenosis, bilateral 12/30/2018  . Stroke ([place]) 12/24/2018  . Stroke-like symptoms 12/23/2018  . Generalized weakness 06/09/2018  . Hypothyroidism (acquired) 05/03/2018  . Hypertension 05/03/2018  . Uncontrolled type 2 diabetes mellitus with diabetic nephropathy, with long-term current use of insulin ([place])   . Back pain at L4-L5 level 06/14/2013  . Muscle weakness (generalized) 12/17/2012  . Pain in joint, shoulder region 12/17/2012  . Shoulder fracture 12/14/2012  . Proximal humerus fracture 11/29/2012  . FRACTURE, TOE, RIGHT 09/10/2009    OCCUPATIONAL THERAPY DISCHARGE SUMMARY  Visits from Start of Care: 4  Current functional level related to goals / functional outcomes: See above   Remaining deficits: See above   Education / Equipment: See above Plan: Patient agrees to discharge.  Patient goals were met. Patient is being discharged due to meeting the stated rehab goals.  ?????         Ailene Ravel, OTR/L,CBIS  [phone]  08/23/2019, 4:07 PM  [facility] [address] [place], [place], [address] Phone: [phone]   Fax:  [phone]  Name: LAURALIE BLACKSHER MRN: [id number] Date of Birth:  26-Dec-1931

## 2019-08-25 ENCOUNTER — Ambulatory Visit: Payer: Medicare Other | Admitting: Physical Medicine & Rehabilitation

## 2019-10-03 ENCOUNTER — Other Ambulatory Visit: Payer: Self-pay | Admitting: Family Medicine

## 2019-10-11 ENCOUNTER — Other Ambulatory Visit: Payer: Self-pay

## 2019-10-11 ENCOUNTER — Encounter: Payer: Self-pay | Admitting: Physical Medicine & Rehabilitation

## 2019-10-11 ENCOUNTER — Encounter: Payer: Medicare Other | Attending: Registered Nurse | Admitting: Physical Medicine & Rehabilitation

## 2019-10-11 VITALS — BP 147/74 | HR 73 | Temp 97.2°F | Ht 65.0 in | Wt 142.0 lb

## 2019-10-11 DIAGNOSIS — Z794 Long term (current) use of insulin: Secondary | ICD-10-CM | POA: Diagnosis present

## 2019-10-11 DIAGNOSIS — E039 Hypothyroidism, unspecified: Secondary | ICD-10-CM | POA: Insufficient documentation

## 2019-10-11 DIAGNOSIS — I63511 Cerebral infarction due to unspecified occlusion or stenosis of right middle cerebral artery: Secondary | ICD-10-CM | POA: Diagnosis present

## 2019-10-11 DIAGNOSIS — G8110 Spastic hemiplegia affecting unspecified side: Secondary | ICD-10-CM | POA: Diagnosis present

## 2019-10-11 DIAGNOSIS — E1165 Type 2 diabetes mellitus with hyperglycemia: Secondary | ICD-10-CM | POA: Diagnosis present

## 2019-10-11 DIAGNOSIS — I10 Essential (primary) hypertension: Secondary | ICD-10-CM | POA: Diagnosis present

## 2019-10-11 DIAGNOSIS — E7849 Other hyperlipidemia: Secondary | ICD-10-CM | POA: Diagnosis present

## 2019-10-11 DIAGNOSIS — E1121 Type 2 diabetes mellitus with diabetic nephropathy: Secondary | ICD-10-CM | POA: Insufficient documentation

## 2019-10-11 DIAGNOSIS — I6523 Occlusion and stenosis of bilateral carotid arteries: Secondary | ICD-10-CM | POA: Insufficient documentation

## 2019-10-11 NOTE — Progress Notes (Signed)
Botox Injection for spasticity using needle EMG guidance  Dilution: 50 Units/ml Indication: Severe spasticity which interferes with ADL,mobility and/or  hygiene and is unresponsive to medication management and other conservative care Informed consent was obtained after describing risks and benefits of the procedure with the patient. This includes bleeding, bruising, infection, excessive weakness, or medication side effects. A REMS form is on file and signed. Needle: 27g 1" needle electrode Number of units per muscle  Left FDS 50 units Left FDP 50 units Left FCR 25 units Left biceps 50 units  Left Pectoralis 125units All injections were done after obtaining appropriate EMG activity and after negative drawback for blood. The patient tolerated the procedure well. Post procedure instructions were given. A followup appointment was made.

## 2019-10-18 ENCOUNTER — Other Ambulatory Visit: Payer: Self-pay

## 2019-10-18 ENCOUNTER — Ambulatory Visit (INDEPENDENT_AMBULATORY_CARE_PROVIDER_SITE_OTHER): Payer: Medicare Other | Admitting: Family Medicine

## 2019-10-18 ENCOUNTER — Encounter: Payer: Self-pay | Admitting: Family Medicine

## 2019-10-18 VITALS — BP 150/77 | HR 89 | Temp 97.7°F | Wt 144.4 lb

## 2019-10-18 DIAGNOSIS — I10 Essential (primary) hypertension: Secondary | ICD-10-CM | POA: Diagnosis not present

## 2019-10-18 DIAGNOSIS — E785 Hyperlipidemia, unspecified: Secondary | ICD-10-CM | POA: Diagnosis not present

## 2019-10-18 DIAGNOSIS — J449 Chronic obstructive pulmonary disease, unspecified: Secondary | ICD-10-CM

## 2019-10-18 DIAGNOSIS — M810 Age-related osteoporosis without current pathological fracture: Secondary | ICD-10-CM

## 2019-10-18 NOTE — Patient Instructions (Signed)
DEXA scan  Pick up blood pressure monitor-check blood pressure in the early morning Continue follow up with vascular specialist, eye specialist, diabetic specialist

## 2019-10-18 NOTE — Progress Notes (Signed)
Established Patient Office Visit  Subjective:  Patient ID: Danielle [name], female    DOB: 1931-07-10  Age: 86 y.o. MRN: [id number]  CC:  Chief Complaint  Patient presents with  . Hypertension    6 month     HPI Danielle [name] presents for HTN Dr. Apolonio Schneiders Surgery-carotid evaluation-h/o CVA with ongoing weakness-left upper extremity. Pt able to walk with no assistance Endo-DM, hypothyroid Retinal Specialist-DM Hospitalization-lipid panel, liver function test-8/20 Past Medical History:  Diagnosis Date  . Arthritis    "some joint pain once in awhile" (05/26/2017)  . Cataract   . CVA (cerebral vascular accident) ([place]) 2019  . Heart murmur   . History of kidney stones   . Hyperlipemia   . Hypertension   . Hypothyroidism (acquired)   . Osteoporosis   . Pneumothorax 02/05/2017   right-after fall  . Rib fractures 02/05/2017   right side  . Stroke ([place])   . Type II diabetes mellitus ([place])     Past Surgical History:  Procedure Laterality Date  . AUGMENTATION MAMMAPLASTY Bilateral   . COSMETIC SURGERY    . DILATION AND CURETTAGE OF UTERUS    . EYE SURGERY    . FRACTURE SURGERY    . JOINT REPLACEMENT    . REVERSE SHOULDER ARTHROPLASTY Right 05/26/2017  . REVERSE SHOULDER ARTHROPLASTY Right 05/26/2017   Procedure: REVERSE RIGHT SHOULDER ARTHROPLASTY;  Surgeon: Meredith Pel, MD;  Location: [facility];  Service: Orthopedics;  Laterality: Right;  . TONSILLECTOMY    . TUBAL LIGATION      Family History  Problem Relation Age of Onset  . Cancer Mother   . Heart disease Father     Social History   Socioeconomic History  . Marital status: Married    Spouse name: Not on file  . Number of children: Not on file  . Years of education: Not on file  . Highest education level: Not on file  Occupational History  . Occupation: retired  Tobacco Use  . Smoking status: Former Smoker    Packs/day: 1.00    Years: 40.00    Pack years: 40.00    Types: Cigarettes     Quit date: 1991    Years since quitting: 30.4  . Smokeless tobacco: Never Used  Substance and Sexual Activity  . Alcohol use: No  . Drug use: No  . Sexual activity: Yes  Other Topics Concern  . Not on file  Social History Narrative  . Not on file   Social Determinants of Health   Financial Resource Strain:   . Difficulty of Paying Living Expenses:   Food Insecurity:   . Worried About Charity fundraiser in the Last Year:   . Arboriculturist in the Last Year:   Transportation Needs:   . Film/video editor (Medical):   Marland Kitchen Lack of Transportation (Non-Medical):   Physical Activity:   . Days of Exercise per Week:   . Minutes of Exercise per Session:   Stress:   . Feeling of Stress :   Social Connections:   . Frequency of Communication with Friends and Family:   . Frequency of Social Gatherings with Friends and Family:   . Attends Religious Services:   . Active Member of Clubs or Organizations:   . Attends Archivist Meetings:   Marland Kitchen Marital Status:   Intimate Partner Violence:   . Fear of Current or Ex-Partner:   . Emotionally Abused:   Marland Kitchen Physically Abused:   .  Sexually Abused:     Outpatient Medications Prior to Visit  Medication Sig Dispense Refill  . ACCU-CHEK GUIDE test strip USE AS DIRECTED FOUR TIMES A DAY 300 strip 0  . acetaminophen (TYLENOL) 325 MG tablet Take 2 tablets (650 mg total) by mouth every 6 (six) hours as needed for mild pain (or Fever >/= 101).    Marland Kitchen amLODipine (NORVASC) 5 MG tablet Take 1 tablet (5 mg total) by mouth daily. 30 tablet 11  . atorvastatin (LIPITOR) 80 MG tablet TAKE 1 TABLET (80 MG TOTAL) BY MOUTH DAILY AT 6 PM. 90 tablet 1  . Calcium Carb-Cholecalciferol (CALCIUM 600 + D PO) Take 1 tablet by mouth daily.    . Calcium Carbonate-Vitamin D (CALCIUM-VITAMIN D3 PO) Take 600 mg by mouth.    . clopidogrel (PLAVIX) 75 MG tablet TAKE 1 TABLET BY MOUTH EVERY DAY 90 tablet 4  . diclofenac sodium (VOLTAREN) 1 % GEL Apply 2 g topically  4 (four) times daily. 2 g 0  . Insulin Glargine, 2 Unit Dial, (TOUJEO MAX SOLOSTAR) 300 UNIT/ML SOPN Inject 30 Units into the skin daily before breakfast.     . Insulin Lispro (HUMALOG KWIKPEN) 200 UNIT/ML SOPN Inject into the skin. 10-12 units BID    . Multiple Vitamins-Minerals (OCUVITE ADULT 50+ PO) Take by mouth.    . multivitamin (PROSIGHT) TABS tablet Take 1 tablet by mouth daily. 30 tablet 0  . senna-docusate (SENOKOT-S) 8.6-50 MG tablet Take 2 tablets by mouth at bedtime. (Patient taking differently: Take 2 tablets by mouth at bedtime as needed. )    . SYNTHROID 100 MCG tablet Take 1 tablet (100 mcg total) by mouth every morning. 30 tablet 0  . vitamin B-12 (CYANOCOBALAMIN) 1000 MCG tablet Take 1 tablet (1,000 mcg total) by mouth daily. 30 tablet 0  . aspirin 325 MG tablet Take 1 tablet (325 mg total) by mouth daily. 30 tablet 12   No facility-administered medications prior to visit.    Allergies  Allergen Reactions  . Fosamax [Alendronate Sodium] Other (See Comments)    MUSCLE ACHES  . Prednisone     Feels jittery and nauseous    ROS Review of Systems  Eyes:       Retinal specialist  Respiratory: Negative.   Cardiovascular: Negative.        Murmur  Endocrine:       DM hypothyroid  Neurological: Positive for weakness.       Left upper extremity      Objective:    Physical Exam  Constitutional: She is oriented to person, place, and time. She appears well-developed and well-nourished.  HENT:  Head: Normocephalic and atraumatic.  Eyes: Conjunctivae are normal.  Cardiovascular: Normal rate, regular rhythm and normal heart sounds.  Pulmonary/Chest: Effort normal and breath sounds normal.  Neurological: She is alert and oriented to person, place, and time.  Psychiatric: She has a normal mood and affect. Her behavior is normal.    BP (!) 150/77 (BP Location: Right Arm, Patient Position: Sitting)   Pulse 89   Temp 97.7 F (36.5 C) (Temporal)   Wt 144 lb 6.4 oz  (65.5 kg)   SpO2 98%   BMI 24.03 kg/m  Wt Readings from Last 3 Encounters:  10/18/19 144 lb 6.4 oz (65.5 kg)  10/11/19 142 lb (64.4 kg)  08/18/19 139 lb (63 kg)     Health Maintenance Due  Topic Date Due  . FOOT EXAM  Never done  . OPHTHALMOLOGY EXAM  Never  done  . URINE MICROALBUMIN  Never done  . HEMOGLOBIN A1C  06/25/2019    Lab Results  Component Value Date   TSH 0.122 (L) 12/29/2018   Lab Results  Component Value Date   WBC 5.9 01/04/2019   HGB 12.0 01/04/2019   HCT 36.4 01/04/2019   MCV 91.0 01/04/2019   PLT 301 01/04/2019   Lab Results  Component Value Date   NA 139 01/04/2019   K 3.5 01/04/2019   CO2 24 01/04/2019   GLUCOSE 247 (H) 01/04/2019   BUN 18 01/04/2019   CREATININE 0.96 01/04/2019   BILITOT 0.7 01/04/2019   ALKPHOS 101 01/04/2019   AST 51 (H) 01/04/2019   ALT 36 01/04/2019   PROT 5.2 (L) 01/04/2019   ALBUMIN 2.6 (L) 01/04/2019   CALCIUM 9.1 01/04/2019   ANIONGAP 8 01/04/2019   Lab Results  Component Value Date   CHOL 171 12/24/2018   Lab Results  Component Value Date   HDL 59 12/24/2018   Lab Results  Component Value Date   LDLCALC 97 12/24/2018   Lab Results  Component Value Date   TRIG 75 12/24/2018   Lab Results  Component Value Date   CHOLHDL 2.9 12/24/2018   Lab Results  Component Value Date   HGBA1C 10.3 (H) 12/23/2018      Assessment & Plan:  1. Osteoporosis, unspecified osteoporosis type, unspecified pathological fracture presence DEXA-ordered-pt wanted to wait until she saw Jarrett Soho in 3 weeks for follow up 2. Essential hypertension Amlodipine-stable-suggested bp monitoring at home H/o CVA-left hand numbness-strength has not returned-plavix daily 3. Hyperlipidemia, unspecified hyperlipidemia type Lipitor daily-liver function slightly elevated when hospitalized Follow-up: pick up bp monitor machine  Dacia Capers Hannah Beat, MD

## 2019-11-10 ENCOUNTER — Other Ambulatory Visit: Payer: Self-pay

## 2019-11-10 ENCOUNTER — Ambulatory Visit (INDEPENDENT_AMBULATORY_CARE_PROVIDER_SITE_OTHER): Payer: Medicare Other | Admitting: Family Medicine

## 2019-11-10 ENCOUNTER — Encounter: Payer: Self-pay | Admitting: Family Medicine

## 2019-11-10 VITALS — BP 118/62 | HR 80 | Temp 97.3°F | Ht 65.0 in | Wt 146.8 lb

## 2019-11-10 DIAGNOSIS — I6523 Occlusion and stenosis of bilateral carotid arteries: Secondary | ICD-10-CM

## 2019-11-10 DIAGNOSIS — E1165 Type 2 diabetes mellitus with hyperglycemia: Secondary | ICD-10-CM

## 2019-11-10 DIAGNOSIS — Z794 Long term (current) use of insulin: Secondary | ICD-10-CM

## 2019-11-10 DIAGNOSIS — E785 Hyperlipidemia, unspecified: Secondary | ICD-10-CM

## 2019-11-10 DIAGNOSIS — E039 Hypothyroidism, unspecified: Secondary | ICD-10-CM | POA: Diagnosis not present

## 2019-11-10 DIAGNOSIS — E1121 Type 2 diabetes mellitus with diabetic nephropathy: Secondary | ICD-10-CM

## 2019-11-10 DIAGNOSIS — I10 Essential (primary) hypertension: Secondary | ICD-10-CM

## 2019-11-10 DIAGNOSIS — M810 Age-related osteoporosis without current pathological fracture: Secondary | ICD-10-CM | POA: Diagnosis not present

## 2019-11-10 DIAGNOSIS — I6930 Unspecified sequelae of cerebral infarction: Secondary | ICD-10-CM | POA: Insufficient documentation

## 2019-11-10 DIAGNOSIS — IMO0002 Reserved for concepts with insufficient information to code with codable children: Secondary | ICD-10-CM

## 2019-11-10 NOTE — Assessment & Plan Note (Signed)
Is followed by endocrinology overall reports that blood sugars have improved.  Husband is giving her the injections as he is supposed to at this time as he got mixed up with this.  He also sometimes forgets to give her the injection but overall has improved with this as well.  She is also on a statin medication.

## 2019-11-10 NOTE — Progress Notes (Signed)
Subjective:  Patient ID: Danielle [name], female    DOB: [birth year]  Age: 86 y.o. MRN: [id number]  CC:  Chief Complaint  Patient presents with   New Patient (Initial Visit)    former dr hawkins/dr corum pt just has concerns about sugars going up and down       HPI  HPI  Danielle [name] is an 86 year old female patient who presents today to establish care.  Previously a patient of Danielle [name].  Also is followed by Dr. Joya Salm for endocrinology.  Dr. Zadie Rhine for diabetic eye care.  Dr. Carlis Abbott with vascular care.  Has a history that is significant for cerebral vascular accident, heart murmur which she has had most of her life without any issue, hyperlipidemia, hypertension, osteoporosis, hypothyroidism, type 2 diabetes.  She was concerned that her blood sugars were going up and down.  However her husband is here with her today and reports that sometimes he forgets to give her the injection at times was not getting her the injection the way that especially given.  He is now corrected that and her blood sugars have gotten better.  She is in a wheelchair and requires a cane for mobility.  Secondary to her CVA she was left with left arm paralysis.  Has some left hand and finger swelling at times.  Her husband Danielle [name] who is also here to establish care and reports that he has care for overall.  They have been married for 10 years December 20 of this year.  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  She was sleeping okay until she had her stroke has not slept good since.  Does not have any problems chewing eating or swallowing.  Appetite is good.  Does have some accidents and a urinary incontinence at times.  Wears a pull-up.  Does not have any bowel movement issues.  No blood in stool or urine.  Ever since the stroke she has her days times mixed up.  Every time she takes a nap when she wakes up she feels like it is breakfast time.  Otherwise she is able  to be pretty consistent with her memory.  She is positive for having falls and difficulty with mobility.  She does not have any skin issues or skin breakdown.  She not having chest pain, shortness of breath, headaches or dizziness.  Reports taking all her medications as directed and without any issue.  Reports that her endocrinologist usually does her lab work and she does not need to have anything checked at this time.  Last A1c was 7.6 in April.  Today patient denies signs and symptoms of COVID 19 infection including fever, chills, cough, shortness of breath, and headache. Past Medical, Surgical, Social History, Allergies, and Medications have been Reviewed.   Past Medical History:  Diagnosis Date   Arthritis    "some joint pain once in awhile" (05/26/2017)   Back pain at L4-L5 level 06/14/2013   Cataract    CVA (cerebral vascular accident) [facility]) 2019   Encounter for screening mammogram for malignant neoplasm of breast 04/19/2019   Generalized weakness 06/09/2018   Heart murmur    History of kidney stones    Hyperlipemia    Hypertension    Hypothyroidism (acquired)    Muscle weakness (generalized) 12/17/2012   Osteoporosis    Pain in joint, shoulder region 12/17/2012   Pneumothorax 02/05/2017   right-after fall   Proximal  humerus fracture 11/29/2012   Rib fractures 02/05/2017   right side   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   amLODipine (NORVASC) 5 MG tablet Take 1 tablet (5 mg total) by mouth daily.   atorvastatin (LIPITOR) 80 MG tablet TAKE 1 TABLET (80 MG TOTAL) BY MOUTH DAILY AT 6 PM.    Calcium Carb-Cholecalciferol (CALCIUM 600 + D PO) Take 1 tablet by mouth daily.   Calcium Carbonate-Vitamin D (CALCIUM-VITAMIN D3 PO) Take 600 mg by mouth.   clopidogrel (PLAVIX) 75 MG tablet TAKE 1 TABLET BY MOUTH EVERY DAY   diclofenac sodium (VOLTAREN) 1 % GEL Apply 2 g topically 4 (four) times daily.   Insulin Glargine, 2 Unit Dial, (TOUJEO MAX SOLOSTAR) 300 UNIT/ML SOPN Inject 30 Units into the skin daily before breakfast.    Insulin Lispro (HUMALOG KWIKPEN) 200 UNIT/ML SOPN Inject into the skin. 10-12 units BID   Multiple Vitamins-Minerals (OCUVITE ADULT 50+ PO) Take by mouth.   senna-docusate (SENOKOT-S) 8.6-50 MG tablet Take 2 tablets by mouth at bedtime. (Patient taking differently: Take 2 tablets by mouth at bedtime as needed. )   SYNTHROID 100 MCG tablet Take 1 tablet (100 mcg total) by mouth every morning.   vitamin B-12 (CYANOCOBALAMIN) 1000 MCG tablet Take 1 tablet (1,000 mcg total) by mouth daily.    ROS:  Review of Systems  Constitutional: Negative.   HENT: Negative.   Eyes: Negative.   Respiratory: Negative.   Cardiovascular: Negative.   Gastrointestinal: Negative.   Genitourinary: Negative.   Musculoskeletal: Negative.        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   Neurological: Negative.   Endo/Heme/Allergies: Negative.   Psychiatric/Behavioral: Positive for memory loss.  All other systems reviewed and are negative.    Objective:   Today's Vitals: BP 118/62 (BP Location: Right Arm, Patient Position: Sitting, Cuff Size: Normal)    Pulse 80    Temp (!) 97.3 F (36.3 C) (Temporal)    Ht 5\' 5"  (1.651 m)    Wt 146 lb 12.8 oz (66.6 kg)    SpO2 96%    BMI 24.43 kg/m  Vitals with BMI 11/10/2019 10/18/2019 10/11/2019  Height 5\' 5"  - 5\' 5"   Weight 146 lbs 13 oz 144 lbs 6 oz 142 lbs  BMI 24.43 63.87 56.43  Systolic 329 518 841  Diastolic 62 77 74  Pulse 80 89 73     Physical Exam Vitals and nursing note reviewed.  Constitutional:       Appearance: Normal appearance. She is well-developed and well-groomed. She is obese.  HENT:     Head: Normocephalic and atraumatic.     Right Ear: External ear normal.     Left Ear: External ear normal.     Mouth/Throat:     Comments: Mask in place Eyes:     General:        Right eye: No discharge.        Left eye: No discharge.     Conjunctiva/sclera: Conjunctivae normal.  Cardiovascular:     Rate  and Rhythm: Normal rate and regular rhythm.     Pulses: Normal pulses.     Heart sounds: Normal heart sounds.  Pulmonary:     Effort: Pulmonary effort is normal.     Breath sounds: Normal breath sounds.  Musculoskeletal:        General: Normal range of motion.     Cervical back: Normal range of motion and neck supple.     Comments: Paralysis of left arm In wheelchair uses for prong cane  Skin:    General: Skin is warm.  Neurological:     General: No focal deficit present.     Mental Status: She is alert and oriented to person, place, and time.  Psychiatric:        Attention and Perception: Attention and perception normal.        Mood and Affect: Mood and affect normal.        Speech: Speech normal.        Behavior: Behavior normal. Behavior is cooperative.        Thought Content: Thought content normal.        Cognition and Memory: Cognition normal. She exhibits impaired recent memory.        Judgment: Judgment normal.     Comments: Good conversation, pleasant good eye contact     Assessment   1. Uncontrolled type 2 diabetes mellitus with diabetic nephropathy, with long-term current use of insulin ([place])   2. Essential hypertension   3. Hypothyroidism (acquired)   4. Osteoporosis, unspecified osteoporosis type, unspecified pathological fracture presence   5. Hyperlipidemia, unspecified hyperlipidemia type   6. History of CVA with residual deficit   7. Carotid stenosis, bilateral     Tests ordered No orders of the defined types were placed in this encounter.      Plan: Please see assessment and plan per problem list above.   No orders of the defined types were placed in this encounter.   Patient to follow-up in 3 months  Perlie Mayo, NP

## 2019-11-10 NOTE — Assessment & Plan Note (Signed)
Well-controlled continue Norvasc at this time.  DASH diet and exercise as body can tolerate recommended

## 2019-11-10 NOTE — Assessment & Plan Note (Signed)
Is followed by endocrinology has no signs or symptoms of hypothyroidism at this time.

## 2019-11-10 NOTE — Assessment & Plan Note (Signed)
Continue Lipitor, encouraged heart healthy diet.

## 2019-11-10 NOTE — Assessment & Plan Note (Signed)
She reports taking her calcium and vitamin D as directed.  Has allergies to Fosamax.

## 2019-11-10 NOTE — Assessment & Plan Note (Signed)
Is followed by Dr. Carlis Abbott vascular surgery.

## 2019-11-10 NOTE — Patient Instructions (Signed)
I appreciate the opportunity to provide you with care for your health and wellness. Today we discussed: established care  Follow up: 3 months  No labs or referrals today  Great to meet you today.  Use the watch and calendar to help orient your time. Try to avoid use of the horn unless need something emergent. :) Use pad and pen to write down things that you might need to ask [name] when he comes back in room.  Please continue to practice social distancing to keep you, your family, and our community safe.  If you must go out, please wear a mask and practice good handwashing.  It was a pleasure to see you and I look forward to continuing to work together on your health and well-being. Please do not hesitate to call the office if you need care or have questions about your care.  Have a wonderful day and week. With Gratitude, Cherly Beach, DNP, AGNP-BC

## 2019-11-22 ENCOUNTER — Encounter: Payer: Medicare Other | Attending: Registered Nurse | Admitting: Physical Medicine & Rehabilitation

## 2019-11-22 ENCOUNTER — Encounter: Payer: Self-pay | Admitting: Physical Medicine & Rehabilitation

## 2019-11-22 ENCOUNTER — Other Ambulatory Visit: Payer: Self-pay

## 2019-11-22 VITALS — BP 109/69 | HR 76 | Temp 98.2°F | Ht 65.0 in | Wt 147.2 lb

## 2019-11-22 DIAGNOSIS — Z794 Long term (current) use of insulin: Secondary | ICD-10-CM | POA: Insufficient documentation

## 2019-11-22 DIAGNOSIS — E1121 Type 2 diabetes mellitus with diabetic nephropathy: Secondary | ICD-10-CM | POA: Insufficient documentation

## 2019-11-22 DIAGNOSIS — I6523 Occlusion and stenosis of bilateral carotid arteries: Secondary | ICD-10-CM | POA: Diagnosis present

## 2019-11-22 DIAGNOSIS — G8110 Spastic hemiplegia affecting unspecified side: Secondary | ICD-10-CM | POA: Diagnosis not present

## 2019-11-22 DIAGNOSIS — I10 Essential (primary) hypertension: Secondary | ICD-10-CM | POA: Insufficient documentation

## 2019-11-22 DIAGNOSIS — E1165 Type 2 diabetes mellitus with hyperglycemia: Secondary | ICD-10-CM | POA: Diagnosis present

## 2019-11-22 DIAGNOSIS — E039 Hypothyroidism, unspecified: Secondary | ICD-10-CM | POA: Diagnosis present

## 2019-11-22 DIAGNOSIS — E7849 Other hyperlipidemia: Secondary | ICD-10-CM | POA: Diagnosis present

## 2019-11-22 DIAGNOSIS — I63511 Cerebral infarction due to unspecified occlusion or stenosis of right middle cerebral artery: Secondary | ICD-10-CM | POA: Diagnosis present

## 2019-11-22 NOTE — Progress Notes (Signed)
Subjective:    Patient ID: Danielle [name], female    DOB: 11-14-1931, 86 y.o.   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  She has had a chronic left spastic hemiparesis affecting mainly her upper extremity.  She lives with her husband who assists with her care.  She is unable to do her range of motion independently and therefore physical therapy has recommended that her husband helps her with her shoulder range of motion, wrist and hand range  10/11/2019 BOTOX Left FDS 50 units Left FDP 50 units Left FCR 25 units Left biceps 50 units  Left Pectoralis 125units  GOOD BENEFIT FROM BOTOX, HUSBAND NOTES THAT FINGERS ARE MUCH MORE RELAXED NOW.  USING CANE TO AMBULATE Fear of falling , transferred WC to recliner and slowly lowered to ground 2-3 wks ago, no injury  Pain Inventory Average Pain 1 Pain Right Now 1 My pain is other  In the last 24 hours, has pain interfered with the following? General activity 1 Relation with others 1 Enjoyment of life 1 What TIME of day is your pain at its worst? varies Sleep (in general) Fair  Pain is worse with: inactivity, standing and unsure Pain improves with: na Relief from Meds: na  Mobility walk with assistance use a cane ability to climb steps?  no do you drive?  no  Function retired I need assistance with the following:  feeding, dressing, bathing, toileting, meal prep, household duties and shopping  Neuro/Psych trouble walking confusion  Prior Studies Any changes since last visit?  no  Physicians involved in your care Any changes since last visit?  no   Family History  Problem Relation Age of Onset  . Cancer Mother   . Heart disease Father    Social History   Socioeconomic History  . Marital status: Married    Spouse name: [name]   . Number of children: 2  . Years of education: Not on file  . Highest education level: Not on file  Occupational History  .  Occupation: retired  Tobacco Use  . Smoking status: Former Smoker    Packs/day: 1.00    Years: 40.00    Pack years: 40.00    Types: Cigarettes    Quit date: 1991    Years since quitting: 30.5  . Smokeless tobacco: Never Used  Vaping Use  . Vaping Use: Never used  Substance and Sexual Activity  . Alcohol use: No  . Drug use: No  . Sexual activity: Yes  Other Topics Concern  . Not on file  Social History Narrative   Lives  with husband      Catalina Antigua and [name] -children; lives close by   5 grandchildren, 2 great grandchildren      Enjoys: antiquing, shopping, beach and mountains      Diet: eats all food groups   Caffeine: coffee and coke -drinks for low sugar    Water: 4-5 cups daily       Wears seat belt, is not able to drive well   No Geophysical data processor at home    Social Determinants of Health   Financial Resource Strain: Low Risk   . Difficulty of Paying Living Expenses: Not hard at all  Food Insecurity: No Food Insecurity  . Worried About Charity fundraiser in the Last Year: Never true  . Ran Out of Food in the Last Year: Never true  Transportation Needs: No Transportation  Needs  . Lack of Transportation (Medical): No  . Lack of Transportation (Non-Medical): No  Physical Activity: Inactive  . Days of Exercise per Week: 0 days  . Minutes of Exercise per Session: 0 min  Stress: No Stress Concern Present  . Feeling of Stress : Not at all  Social Connections: Moderately Isolated  . Frequency of Communication with Friends and Family: Three times a week  . Frequency of Social Gatherings with Friends and Family: Once a week  . Attends Religious Services: Never  . Active Member of Clubs or Organizations: No  . Attends Archivist Meetings: Never  . Marital Status: Married   Past Surgical History:  Procedure Laterality Date  . AUGMENTATION MAMMAPLASTY Bilateral   . COSMETIC SURGERY    . DILATION AND CURETTAGE OF UTERUS    . EYE SURGERY    . FRACTURE  SURGERY    . JOINT REPLACEMENT    . REVERSE SHOULDER ARTHROPLASTY Right 05/26/2017  . REVERSE SHOULDER ARTHROPLASTY Right 05/26/2017   Procedure: REVERSE RIGHT SHOULDER ARTHROPLASTY;  Surgeon: Meredith Pel, MD;  Location: [facility];  Service: Orthopedics;  Laterality: Right;  . TONSILLECTOMY    . TUBAL LIGATION     Past Medical History:  Diagnosis Date  . Arthritis    "some joint pain once in awhile" (05/26/2017)  . Back pain at L4-L5 level 06/14/2013  . Cataract   . CVA (cerebral vascular accident) ([facility]) 2019  . Encounter for screening mammogram for malignant neoplasm of breast 04/19/2019  . Generalized weakness 06/09/2018  . Heart murmur   . History of kidney stones   . Hyperlipemia   . Hypertension   . Hypothyroidism (acquired)   . Muscle weakness (generalized) 12/17/2012  . Osteoporosis   . Pain in joint, shoulder region 12/17/2012  . Pneumothorax 02/05/2017   right-after fall  . Proximal humerus fracture 11/29/2012  . Rib fractures 02/05/2017   right side  . 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 Shoulder fracture 12/14/2012  . Stroke ([facility])   . Stroke-like symptoms 12/23/2018  . Type II diabetes mellitus (HCC)    BP 109/69   Pulse 76   Temp 98.2 F (36.8 C)   Ht 5\' 5"  (1.651 m)   Wt 147 lb 3.2 oz (66.8 kg)   SpO2 94%   BMI 24.50 kg/m   Opioid Risk Score:   Fall Risk Score:  `1  Depression screen PHQ 2/9  Depression screen [facility] 2/9 11/22/2019 11/10/2019 10/11/2019 08/18/2019 05/26/2019 02/02/2019  Decreased Interest 0 0 1 1 1 1   Down, Depressed, Hopeless 1 1 1 1 1  0  PHQ - 2 Score 1 1 2 2 2 1   Altered sleeping - 0 - - - 2  Tired, decreased energy - 3 - - - 2  Change in appetite - 0 - - - 0  Feeling bad or failure about yourself  - 1 - - - 0  Trouble concentrating - 0 - - - 1  Moving slowly or fidgety/restless - 0 - - - 0  Suicidal thoughts - 0 - - - 0  PHQ-9 Score - 5 - - - 6  Difficult doing work/chores - Somewhat difficult - - - Very difficult      Review of Systems  Musculoskeletal: Positive for gait problem.  Psychiatric/Behavioral: Positive for confusion.  All other systems reviewed and are negative.      Objective:   Physical Exam Vitals and nursing note reviewed.  Constitutional:  Appearance: Normal appearance.  HENT:     Head: Normocephalic and atraumatic.  Eyes:     Extraocular Movements: Extraocular movements intact.     Conjunctiva/sclera: Conjunctivae normal.     Pupils: Pupils are equal, round, and reactive to light.  Neurological:     General: No focal deficit present.     Mental Status: She is alert and oriented to person, place, and time.     Motor: Weakness and abnormal muscle tone present.     Coordination: Coordination abnormal.     Comments: Motor strength is 3 - at the left deltoid biceps 2 - at the triceps trace finger flexors 0 finger extensors Tone MAS 2 at the pectoralis MAS 1 at the finger flexors, MAS 0 at the wrist flexors There is no evidence of clonus at the left ankle She has motor strength of 4/5 in left hip flexor knee extensor ankle dorsiflexor.  Psychiatric:        Mood and Affect: Mood normal.        Behavior: Behavior normal.           Assessment & Plan:  #1.  Left hemiparesis due to right MCA infarct 11 months post Patient has been asking about prognosis for further functional improvement.  As we discussed it is fairly limited at this point given timeframe since CVA.  She needs to continue with range of motion at the left shoulder.  Husband will need to assess with this.  Wrist and hand range of motion can be done independently. We discussed botulinum toxin injection this was helpful, we discussed that it should be wearing off in the next month to 6 weeks and can be repeated in 6 weeks.  We discussed that sometimes Botox dosing over time can be reduced and occasionally injections may no longer be needed however this is usually over the course of a couple years We will  repeat same Botox dose and muscle group selection.

## 2019-11-22 NOTE — Patient Instructions (Signed)
Will keeo Botox dose and muscle group selection identical, repeat in 6 weeks

## 2019-12-22 ENCOUNTER — Ambulatory Visit (INDEPENDENT_AMBULATORY_CARE_PROVIDER_SITE_OTHER): Payer: Medicare Other | Admitting: Family Medicine

## 2019-12-22 ENCOUNTER — Other Ambulatory Visit (HOSPITAL_COMMUNITY): Payer: Self-pay | Admitting: Family Medicine

## 2019-12-22 ENCOUNTER — Other Ambulatory Visit: Payer: Self-pay | Admitting: Family Medicine

## 2019-12-22 ENCOUNTER — Encounter: Payer: Self-pay | Admitting: Family Medicine

## 2019-12-22 ENCOUNTER — Other Ambulatory Visit: Payer: Self-pay | Admitting: *Deleted

## 2019-12-22 ENCOUNTER — Other Ambulatory Visit: Payer: Self-pay

## 2019-12-22 VITALS — BP 133/69 | HR 78 | Temp 97.5°F | Resp 18 | Ht 65.0 in

## 2019-12-22 DIAGNOSIS — N644 Mastodynia: Secondary | ICD-10-CM | POA: Insufficient documentation

## 2019-12-22 DIAGNOSIS — Z1231 Encounter for screening mammogram for malignant neoplasm of breast: Secondary | ICD-10-CM

## 2019-12-22 NOTE — Patient Instructions (Signed)
I appreciate the opportunity to provide you with care for your health and wellness. Today we discussed: chest/breast pains  Follow up: 02/10/2020 as scheduled  No labs or referrals today  Mammogram ordered today  Airway is clear and you sound good. If you start coughing more, develop a fever, chills, or start feeling worse call the office  Please continue to practice social distancing to keep you, your family, and our community safe.  If you must go out, please wear a mask and practice good handwashing.  It was a pleasure to see you and I look forward to continuing to work together on your health and well-being. Please do not hesitate to call the office if you need care or have questions about your care.  Have a wonderful day and week. With Gratitude, Cherly Beach, DNP, AGNP-BC

## 2019-12-22 NOTE — Assessment & Plan Note (Signed)
Mammogram ordered, PE unremarkable today in office  Provided with info on when a need to return such as fever chills or cough that will not stop. Patient acknowledged agreement and understanding of the plan.

## 2019-12-22 NOTE — Progress Notes (Signed)
Subjective:  Patient ID: Danielle [name], female    DOB: 01-01-1932  Age: 86 y.o. MRN: [id number]  CC:  Chief Complaint  Patient presents with  . Follow-up      HPI  HPI   Danielle [name] is a 86 year old female patient of mine. She comes in today due to questionable left breast pain. Has had implants back in 70's She reports it was painful around the breast while breathing on Tuesday, but for only a few breaths. Was actually going to cancel the appt as it never happened again, and overall she feels good.   Today patient denies signs and symptoms of COVID 19 infection including fever, chills, cough, shortness of breath, and headache. Past Medical, Surgical, Social History, Allergies, and Medications have been Reviewed.   Past Medical History:  Diagnosis Date  . Arthritis    "some joint pain once in awhile" (05/26/2017)  . Back pain at L4-L5 level 06/14/2013  . Cataract   . CVA (cerebral vascular accident) ([place]) 2019  . Encounter for screening mammogram for malignant neoplasm of breast 04/19/2019  . Generalized weakness 06/09/2018  . Heart murmur   . History of kidney stones   . Hyperlipemia   . Hypertension   . Hypothyroidism (acquired)   . Muscle weakness (generalized) 12/17/2012  . Osteoporosis   . Pain in joint, shoulder region 12/17/2012  . Pneumothorax 02/05/2017   right-after fall  . Proximal humerus fracture 11/29/2012  . Rib fractures 02/05/2017   right side  . Right middle cerebral artery stroke ([facility]) 01/03/2019  . Shoulder fracture 12/14/2012  . Stroke ([facility])   . Stroke-like symptoms 12/23/2018  . Type II diabetes mellitus (HCC)     Current Meds  Medication Sig  . ACCU-CHEK GUIDE test strip USE AS DIRECTED FOUR TIMES A DAY  . acetaminophen (TYLENOL) 325 MG tablet Take 2 tablets (650 mg total) by mouth every 6 (six) hours as needed for mild pain (or Fever >/= 101).  Marland Kitchen amLODipine (NORVASC) 5 MG tablet Take 1 tablet (5 mg total) by mouth daily.  Marland Kitchen atorvastatin  (LIPITOR) 80 MG tablet TAKE 1 TABLET (80 MG TOTAL) BY MOUTH DAILY AT 6 PM.  . Calcium Carb-Cholecalciferol (CALCIUM 600 + D PO) Take 1 tablet by mouth daily.  . Calcium Carbonate-Vitamin D (CALCIUM-VITAMIN D3 PO) Take 600 mg by mouth.  . clopidogrel (PLAVIX) 75 MG tablet TAKE 1 TABLET BY MOUTH EVERY DAY  . diclofenac sodium (VOLTAREN) 1 % GEL Apply 2 g topically 4 (four) times daily.  . Insulin Glargine, 2 Unit Dial, (TOUJEO MAX SOLOSTAR) 300 UNIT/ML SOPN Inject 30 Units into the skin daily before breakfast.   . Insulin Lispro (HUMALOG KWIKPEN) 200 UNIT/ML SOPN Inject into the skin. 10-12 units BID  . Multiple Vitamins-Minerals (OCUVITE ADULT 50+ PO) Take by mouth.  . senna-docusate (SENOKOT-S) 8.6-50 MG tablet Take 2 tablets by mouth at bedtime. (Patient taking differently: Take 2 tablets by mouth at bedtime as needed. )  . SYNTHROID 100 MCG tablet Take 1 tablet (100 mcg total) by mouth every morning.  . vitamin B-12 (CYANOCOBALAMIN) 1000 MCG tablet Take 1 tablet (1,000 mcg total) by mouth daily.    ROS:  Review of Systems  Constitutional: Negative.   HENT: Negative.   Eyes: Negative.   Respiratory: Negative.   Cardiovascular: Negative.   Gastrointestinal: Negative.   Genitourinary: Negative.   Musculoskeletal: Negative.   Skin: Negative.   Neurological: Negative.   Endo/Heme/Allergies: Negative.  Psychiatric/Behavioral: Negative.   All other systems reviewed and are negative.    Objective:   Today's Vitals: BP 133/69 (BP Location: Right Arm, Patient Position: Sitting, Cuff Size: Normal)   Pulse 78   Temp (!) 97.5 F (36.4 C) (Temporal)   Resp 18   Ht 5\' 5"  (1.651 m)   SpO2 96%   BMI 24.50 kg/m  Vitals with BMI 12/22/2019 11/22/2019 11/10/2019  Height 5\' 5"  5\' 5"  5\' 5"   Weight (No Data) 147 lbs 3 oz 146 lbs 13 oz  BMI - 97.9 48.01  Systolic 655 374 827  Diastolic 69 69 62  Pulse 78 76 80     Physical Exam Vitals and nursing note reviewed.  Constitutional:       Appearance: Normal appearance. She is well-developed and well-groomed.  HENT:     Head: Normocephalic and atraumatic.     Right Ear: External ear normal.     Left Ear: External ear normal.  Eyes:     General:        Right eye: No discharge.        Left eye: No discharge.     Conjunctiva/sclera: Conjunctivae normal.  Cardiovascular:     Rate and Rhythm: Normal rate and regular rhythm.     Pulses: Normal pulses.     Heart sounds: Normal heart sounds.  Pulmonary:     Effort: Pulmonary effort is normal.     Breath sounds: Normal breath sounds.  Musculoskeletal:        General: Normal range of motion.     Cervical back: Normal range of motion and neck supple.  Skin:    General: Skin is warm.  Neurological:     General: No focal deficit present.     Mental Status: She is alert and oriented to person, place, and time.  Psychiatric:        Attention and Perception: Attention normal.        Mood and Affect: Mood normal.        Speech: Speech normal.        Behavior: Behavior normal. Behavior is cooperative.        Thought Content: Thought content normal.        Cognition and Memory: Cognition normal.        Judgment: Judgment normal.          Assessment   1. Breast pain, left     Tests ordered No orders of the defined types were placed in this encounter.    Plan: Please see assessment and plan per problem list above.   No orders of the defined types were placed in this encounter.   Patient to follow-up in 02/10/2020   Perlie Mayo, NP

## 2019-12-29 ENCOUNTER — Encounter (HOSPITAL_COMMUNITY): Payer: Self-pay | Admitting: Emergency Medicine

## 2019-12-29 ENCOUNTER — Other Ambulatory Visit: Payer: Self-pay

## 2019-12-29 ENCOUNTER — Inpatient Hospital Stay (HOSPITAL_COMMUNITY)
Admission: EM | Admit: 2019-12-29 | Discharge: 2020-01-02 | DRG: 536 | Disposition: A | Discharge status: Skilled Nursing Facility | Payer: Medicare Other | Attending: Family Medicine | Admitting: Family Medicine

## 2019-12-29 ENCOUNTER — Emergency Department (HOSPITAL_COMMUNITY): Payer: Medicare Other

## 2019-12-29 DIAGNOSIS — E039 Hypothyroidism, unspecified: Secondary | ICD-10-CM | POA: Diagnosis present

## 2019-12-29 DIAGNOSIS — E1165 Type 2 diabetes mellitus with hyperglycemia: Secondary | ICD-10-CM | POA: Diagnosis present

## 2019-12-29 DIAGNOSIS — E785 Hyperlipidemia, unspecified: Secondary | ICD-10-CM | POA: Diagnosis present

## 2019-12-29 DIAGNOSIS — Y92009 Unspecified place in unspecified non-institutional (private) residence as the place of occurrence of the external cause: Secondary | ICD-10-CM

## 2019-12-29 DIAGNOSIS — S52182A Other fracture of upper end of left radius, initial encounter for closed fracture: Secondary | ICD-10-CM | POA: Diagnosis present

## 2019-12-29 DIAGNOSIS — S329XXA Fracture of unspecified parts of lumbosacral spine and pelvis, initial encounter for closed fracture: Secondary | ICD-10-CM

## 2019-12-29 DIAGNOSIS — M25532 Pain in left wrist: Secondary | ICD-10-CM

## 2019-12-29 DIAGNOSIS — I6930 Unspecified sequelae of cerebral infarction: Secondary | ICD-10-CM

## 2019-12-29 DIAGNOSIS — Z87891 Personal history of nicotine dependence: Secondary | ICD-10-CM

## 2019-12-29 DIAGNOSIS — Z79899 Other long term (current) drug therapy: Secondary | ICD-10-CM

## 2019-12-29 DIAGNOSIS — S32592A Other specified fracture of left pubis, initial encounter for closed fracture: Principal | ICD-10-CM | POA: Diagnosis present

## 2019-12-29 DIAGNOSIS — K5903 Drug induced constipation: Secondary | ICD-10-CM | POA: Diagnosis not present

## 2019-12-29 DIAGNOSIS — IMO0002 Reserved for concepts with insufficient information to code with codable children: Secondary | ICD-10-CM

## 2019-12-29 DIAGNOSIS — W19XXXA Unspecified fall, initial encounter: Secondary | ICD-10-CM

## 2019-12-29 DIAGNOSIS — I69354 Hemiplegia and hemiparesis following cerebral infarction affecting left non-dominant side: Secondary | ICD-10-CM

## 2019-12-29 DIAGNOSIS — E1121 Type 2 diabetes mellitus with diabetic nephropathy: Secondary | ICD-10-CM

## 2019-12-29 DIAGNOSIS — Z7902 Long term (current) use of antithrombotics/antiplatelets: Secondary | ICD-10-CM

## 2019-12-29 DIAGNOSIS — S32591A Other specified fracture of right pubis, initial encounter for closed fracture: Secondary | ICD-10-CM | POA: Diagnosis present

## 2019-12-29 DIAGNOSIS — I10 Essential (primary) hypertension: Secondary | ICD-10-CM | POA: Diagnosis present

## 2019-12-29 DIAGNOSIS — M810 Age-related osteoporosis without current pathological fracture: Secondary | ICD-10-CM | POA: Diagnosis present

## 2019-12-29 DIAGNOSIS — Z20822 Contact with and (suspected) exposure to covid-19: Secondary | ICD-10-CM | POA: Diagnosis present

## 2019-12-29 DIAGNOSIS — Z888 Allergy status to other drugs, medicaments and biological substances status: Secondary | ICD-10-CM

## 2019-12-29 DIAGNOSIS — Z96611 Presence of right artificial shoulder joint: Secondary | ICD-10-CM | POA: Diagnosis present

## 2019-12-29 DIAGNOSIS — S61217A Laceration without foreign body of left little finger without damage to nail, initial encounter: Secondary | ICD-10-CM

## 2019-12-29 DIAGNOSIS — W010XXA Fall on same level from slipping, tripping and stumbling without subsequent striking against object, initial encounter: Secondary | ICD-10-CM | POA: Diagnosis present

## 2019-12-29 DIAGNOSIS — Z79890 Hormone replacement therapy (postmenopausal): Secondary | ICD-10-CM

## 2019-12-29 DIAGNOSIS — Z794 Long term (current) use of insulin: Secondary | ICD-10-CM

## 2019-12-29 LAB — COMPREHENSIVE METABOLIC PANEL
ALT: 34 U/L (ref 0–44)
AST: 40 U/L (ref 15–41)
Albumin: 3.5 g/dL (ref 3.5–5.0)
Alkaline Phosphatase: 78 U/L (ref 38–126)
Anion gap: 13 (ref 5–15)
BUN: 22 mg/dL (ref 8–23)
CO2: 18 mmol/L — ABNORMAL LOW (ref 22–32)
Calcium: 9.1 mg/dL (ref 8.9–10.3)
Chloride: 105 mmol/L (ref 98–111)
Creatinine, Ser: 0.82 mg/dL (ref 0.44–1.00)
GFR calc Af Amer: >60 mL/min (ref >60)
GFR calc non Af Amer: >60 mL/min (ref >60)
Glucose, Bld: 252 mg/dL — ABNORMAL HIGH (ref 70–99)
Potassium: 3.9 mmol/L (ref 3.5–5.1)
Sodium: 136 mmol/L (ref 135–145)
Total Bilirubin: 1.1 mg/dL (ref 0.3–1.2)
Total Protein: 6.4 g/dL — ABNORMAL LOW (ref 6.5–8.1)

## 2019-12-29 LAB — CBC
HCT: 38.2 % (ref 36.0–46.0)
Hemoglobin: 12.2 g/dL (ref 12.0–15.0)
MCH: 28 pg (ref 26.0–34.0)
MCHC: 31.9 g/dL (ref 30.0–36.0)
MCV: 87.8 fL (ref 80.0–100.0)
Platelets: 260 10*3/uL (ref 150–400)
RBC: 4.35 MIL/uL (ref 3.87–5.11)
RDW: 16.5 % — ABNORMAL HIGH (ref 11.5–15.5)
WBC: 13 10*3/uL — ABNORMAL HIGH (ref 4.0–10.5)
nRBC: 0 % (ref 0.0–0.2)

## 2019-12-29 LAB — GLUCOSE, CAPILLARY
Glucose-Capillary: 306 mg/dL — ABNORMAL HIGH (ref 70–99)
Glucose-Capillary: 210 mg/dL — ABNORMAL HIGH (ref 70–99)

## 2019-12-29 LAB — CBG MONITORING, ED
Glucose-Capillary: 269 mg/dL — ABNORMAL HIGH (ref 70–99)
Glucose-Capillary: 293 mg/dL — ABNORMAL HIGH (ref 70–99)

## 2019-12-29 LAB — SARS CORONAVIRUS 2 BY RT PCR (HOSPITAL ORDER, PERFORMED IN ~~LOC~~ HOSPITAL LAB): SARS Coronavirus 2: NEGATIVE

## 2019-12-29 MED ORDER — SENNOSIDES-DOCUSATE SODIUM 8.6-50 MG PO TABS
2.0000 | ORAL_TABLET | Freq: Two times a day (BID) | ORAL | Status: DC
  Administered 2019-12-29 – 2020-01-02 (×8): 2 via ORAL
  Filled 2019-12-29 (×10): qty 2

## 2019-12-29 MED ORDER — METHOCARBAMOL 500 MG PO TABS
500.0000 mg | ORAL_TABLET | Freq: Three times a day (TID) | ORAL | Status: DC
  Administered 2019-12-29 – 2020-01-02 (×13): 500 mg via ORAL
  Filled 2019-12-29 (×13): qty 1

## 2019-12-29 MED ORDER — LIDOCAINE HCL (PF) 2 % IJ SOLN
10.0000 mL | Freq: Once | INTRAMUSCULAR | Status: AC
  Administered 2019-12-29: 10 mL

## 2019-12-29 MED ORDER — INSULIN ASPART 100 UNIT/ML ~~LOC~~ SOLN
0.0000 [IU] | Freq: Three times a day (TID) | SUBCUTANEOUS | Status: DC
  Administered 2019-12-29: 5 [IU] via SUBCUTANEOUS
  Administered 2019-12-29: 7 [IU] via SUBCUTANEOUS
  Administered 2019-12-30: 3 [IU] via SUBCUTANEOUS
  Administered 2019-12-30 (×2): 5 [IU] via SUBCUTANEOUS
  Administered 2019-12-31: 7 [IU] via SUBCUTANEOUS
  Administered 2019-12-31: 2 [IU] via SUBCUTANEOUS
  Administered 2019-12-31: 5 [IU] via SUBCUTANEOUS
  Administered 2020-01-01: 3 [IU] via SUBCUTANEOUS
  Administered 2020-01-01 – 2020-01-02 (×3): 2 [IU] via SUBCUTANEOUS
  Filled 2019-12-29: qty 1

## 2019-12-29 MED ORDER — BISACODYL 10 MG RE SUPP
10.0000 mg | Freq: Once | RECTAL | Status: AC
  Administered 2019-12-29: 10 mg via RECTAL
  Filled 2019-12-29: qty 1

## 2019-12-29 MED ORDER — LACTULOSE 10 GM/15ML PO SOLN
30.0000 g | ORAL | Status: AC
  Administered 2019-12-29 (×2): 30 g via ORAL
  Filled 2019-12-29 (×2): qty 60

## 2019-12-29 MED ORDER — POVIDONE-IODINE 10 % EX SOLN
CUTANEOUS | Status: AC
  Filled 2019-12-29: qty 15

## 2019-12-29 MED ORDER — BACITRACIN-NEOMYCIN-POLYMYXIN 400-5-5000 EX OINT
TOPICAL_OINTMENT | Freq: Once | CUTANEOUS | Status: AC
  Administered 2019-12-29: 1 via TOPICAL
  Filled 2019-12-29: qty 1

## 2019-12-29 MED ORDER — HYDROMORPHONE HCL 1 MG/ML IJ SOLN
0.5000 mg | INTRAMUSCULAR | Status: DC | PRN
  Administered 2019-12-29 – 2020-01-02 (×4): 0.5 mg via INTRAVENOUS
  Filled 2019-12-29 (×4): qty 0.5

## 2019-12-29 MED ORDER — MORPHINE SULFATE (PF) 2 MG/ML IV SOLN: 2.0000 mg | INTRAVENOUS | Status: DC | PRN

## 2019-12-29 MED ORDER — AMLODIPINE BESYLATE 5 MG PO TABS
5.0000 mg | ORAL_TABLET | Freq: Every day | ORAL | Status: DC
  Administered 2019-12-30 – 2020-01-02 (×4): 5 mg via ORAL
  Filled 2019-12-29 (×5): qty 1

## 2019-12-29 MED ORDER — CALCIUM CARBONATE-VITAMIN D 500-200 MG-UNIT PO TABS
1.0000 | ORAL_TABLET | Freq: Every day | ORAL | Status: DC
  Administered 2019-12-30 – 2020-01-02 (×4): 1 via ORAL
  Filled 2019-12-29 (×5): qty 1

## 2019-12-29 MED ORDER — INSULIN ASPART 100 UNIT/ML ~~LOC~~ SOLN
0.0000 [IU] | Freq: Every day | SUBCUTANEOUS | Status: DC
  Administered 2019-12-29 – 2019-12-30 (×2): 2 [IU] via SUBCUTANEOUS

## 2019-12-29 MED ORDER — POLYETHYLENE GLYCOL 3350 17 G PO PACK
17.0000 g | PACK | Freq: Every day | ORAL | Status: DC
  Administered 2019-12-30 – 2020-01-02 (×4): 17 g via ORAL
  Filled 2019-12-29 (×4): qty 1

## 2019-12-29 MED ORDER — LEVOTHYROXINE SODIUM 100 MCG PO TABS
100.0000 ug | ORAL_TABLET | Freq: Every morning | ORAL | Status: DC
  Administered 2019-12-30 – 2020-01-02 (×4): 100 ug via ORAL
  Filled 2019-12-29 (×4): qty 1

## 2019-12-29 MED ORDER — ATORVASTATIN CALCIUM 40 MG PO TABS
80.0000 mg | ORAL_TABLET | Freq: Every day | ORAL | Status: DC
  Administered 2019-12-29 – 2020-01-01 (×4): 80 mg via ORAL
  Filled 2019-12-29 (×4): qty 2

## 2019-12-29 MED ORDER — VITAMIN B-12 1000 MCG PO TABS
1000.0000 ug | ORAL_TABLET | Freq: Every day | ORAL | Status: DC
  Administered 2019-12-30 – 2020-01-02 (×4): 1000 ug via ORAL
  Filled 2019-12-29 (×5): qty 1

## 2019-12-29 MED ORDER — HYDROMORPHONE HCL 1 MG/ML IJ SOLN
0.5000 mg | Freq: Once | INTRAMUSCULAR | Status: AC
  Administered 2019-12-29: 0.5 mg via INTRAVENOUS
  Filled 2019-12-29: qty 0.5

## 2019-12-29 MED ORDER — ACETAMINOPHEN 325 MG PO TABS
650.0000 mg | ORAL_TABLET | Freq: Once | ORAL | Status: AC
  Administered 2019-12-29: 650 mg via ORAL
  Filled 2019-12-29: qty 2

## 2019-12-29 MED ORDER — INSULIN GLARGINE 100 UNIT/ML ~~LOC~~ SOLN
30.0000 [IU] | Freq: Every day | SUBCUTANEOUS | Status: DC
  Administered 2019-12-29 – 2019-12-30 (×2): 30 [IU] via SUBCUTANEOUS
  Filled 2019-12-29 (×6): qty 0.3

## 2019-12-29 MED ORDER — OXYCODONE HCL 5 MG PO TABS
5.0000 mg | ORAL_TABLET | ORAL | Status: DC | PRN
  Administered 2019-12-30 – 2020-01-02 (×4): 5 mg via ORAL
  Filled 2019-12-29 (×4): qty 1

## 2019-12-29 MED ORDER — ACETAMINOPHEN 325 MG PO TABS
650.0000 mg | ORAL_TABLET | Freq: Four times a day (QID) | ORAL | Status: DC | PRN
  Administered 2019-12-29: 650 mg via ORAL
  Filled 2019-12-29 (×2): qty 2

## 2019-12-29 MED ORDER — CLOPIDOGREL BISULFATE 75 MG PO TABS
75.0000 mg | ORAL_TABLET | Freq: Every day | ORAL | Status: DC
  Administered 2019-12-30 – 2020-01-02 (×4): 75 mg via ORAL
  Filled 2019-12-29 (×5): qty 1

## 2019-12-29 NOTE — Care Management Obs Status (Signed)
[facility] NOTIFICATION   Patient Details  Name: REYGAN HEAGLE MRN: [id number] Date of Birth: [birth year]   Medicare Observation Status Notification Given:  Yes    Sherie Don, LCSW 12/29/2019, 3:02 PM

## 2019-12-29 NOTE — ED Notes (Signed)
Attempted to ambulate pt with no success. Pt was moved to sitting position by myself and nurse tech, pt did not help at all. When we had her sitting on the side of bed she states she is weak and did not even attempt to stand. Pt stated she had to lay back down. EDP notified.

## 2019-12-29 NOTE — ED Provider Notes (Signed)
[facility] EMERGENCY DEPARTMENT Provider Note   CSN: [id number] Arrival date & time: 12/29/19  0030   History Chief Complaint  Patient presents with  . Fall    Danielle [name] is a 86 y.o. female.  The history is provided by the patient.  Fall  She has history of hypertension, diabetes, hyperlipidemia, stroke with residual left hemiparesis and comes in following a fall at home.  She injured her left hip and was not able to get up.  She is not actually complaining of pain in her hip, just some mild soreness.  She also suffered a laceration to her left fifth finger.  She does not know when her last tetanus immunization was.  She is not on any anticoagulants and denies any head injury.  Past Medical History:  Diagnosis Date  . Arthritis    "some joint pain once in awhile" (05/26/2017)  . Back pain at L4-L5 level 06/14/2013  . Cataract   . CVA (cerebral vascular accident) ([place]) 2019  . Encounter for screening mammogram for malignant neoplasm of breast 04/19/2019  . Generalized weakness 06/09/2018  . Heart murmur   . History of kidney stones   . Hyperlipemia   . Hypertension   . Hypothyroidism (acquired)   . Muscle weakness (generalized) 12/17/2012  . Osteoporosis   . Pain in joint, shoulder region 12/17/2012  . Pneumothorax 02/05/2017   right-after fall  . Proximal humerus fracture 11/29/2012  . Rib fractures 02/05/2017   right side  . 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 Shoulder fracture 12/14/2012  . Stroke ([place])   . Stroke-like symptoms 12/23/2018  . Type II diabetes mellitus [facility])     Patient Active Problem List   Diagnosis Date Noted  . Breast pain, left 12/22/2019  . History of CVA with residual deficit 11/10/2019  . Osteoporosis 04/19/2019  . Hyperlipidemia 04/19/2019  . Carotid stenosis, bilateral 12/30/2018  . Hypothyroidism (acquired) 05/03/2018  . Hypertension 05/03/2018  . Uncontrolled type 2 diabetes mellitus with diabetic nephropathy,  with long-term current use of insulin [facility])     Past Surgical History:  Procedure Laterality Date  . AUGMENTATION MAMMAPLASTY Bilateral   . COSMETIC SURGERY    . DILATION AND CURETTAGE OF UTERUS    . EYE SURGERY    . FRACTURE SURGERY    . JOINT REPLACEMENT    . REVERSE SHOULDER ARTHROPLASTY Right 05/26/2017  . REVERSE SHOULDER ARTHROPLASTY Right 05/26/2017   Procedure: REVERSE RIGHT SHOULDER ARTHROPLASTY;  Surgeon: Meredith Pel, MD;  Location: [place];  Service: Orthopedics;  Laterality: Right;  . TONSILLECTOMY    . TUBAL LIGATION       OB History    Gravida      Para      Term      Preterm      AB      Living  2     SAB      TAB      Ectopic      Multiple      Live Births              Family History  Problem Relation Age of Onset  . Cancer Mother   . Heart disease Father     Social History   Tobacco Use  . Smoking status: Former Smoker    Packs/day: 1.00    Years: 40.00    Pack years: 40.00    Types: Cigarettes    Quit date: 1991  Years since quitting: 30.6  . Smokeless tobacco: Never Used  Vaping Use  . Vaping Use: Never used  Substance Use Topics  . Alcohol use: No  . Drug use: No    Home Medications Prior to Admission medications   Medication Sig Start Date End Date Taking? Authorizing Provider  ACCU-CHEK GUIDE test strip USE AS DIRECTED FOUR TIMES A DAY 10/03/19   Corum, Rex Kras, MD  acetaminophen (TYLENOL) 325 MG tablet Take 2 tablets (650 mg total) by mouth every 6 (six) hours as needed for mild pain (or Fever >/= 101). 01/20/19   Angiulli, Lavon Paganini, PA-C  amLODipine (NORVASC) 5 MG tablet Take 1 tablet (5 mg total) by mouth daily. 01/20/19 01/20/20  Angiulli, Lavon Paganini, PA-C  atorvastatin (LIPITOR) 80 MG tablet TAKE 1 TABLET (80 MG TOTAL) BY MOUTH DAILY AT 6 PM. 08/15/19   Corum, Rex Kras, MD  Calcium Carb-Cholecalciferol (CALCIUM 600 + D PO) Take 1 tablet by mouth daily.    [provider]  Calcium Carbonate-Vitamin D  (CALCIUM-VITAMIN D3 PO) Take 600 mg by mouth.    [provider]  clopidogrel (PLAVIX) 75 MG tablet TAKE 1 TABLET BY MOUTH EVERY DAY 08/15/19   Corum, Rex Kras, MD  diclofenac sodium (VOLTAREN) 1 % GEL Apply 2 g topically 4 (four) times daily. 01/20/19   Angiulli, Lavon Paganini, PA-C  Insulin Glargine, 2 Unit Dial, (TOUJEO MAX SOLOSTAR) 300 UNIT/ML SOPN Inject 30 Units into the skin daily before breakfast.     [provider]  Insulin Lispro (HUMALOG KWIKPEN) 200 UNIT/ML SOPN Inject into the skin. 10-12 units BID    [provider]  Multiple Vitamins-Minerals (OCUVITE ADULT 50+ PO) Take by mouth.    [provider]  senna-docusate (SENOKOT-S) 8.6-50 MG tablet Take 2 tablets by mouth at bedtime. Patient taking differently: Take 2 tablets by mouth at bedtime as needed.  01/20/19   Angiulli, Lavon Paganini, PA-C  SYNTHROID 100 MCG tablet Take 1 tablet (100 mcg total) by mouth every morning. 01/20/19   Angiulli, Lavon Paganini, PA-C  vitamin B-12 (CYANOCOBALAMIN) 1000 MCG tablet Take 1 tablet (1,000 mcg total) by mouth daily. 01/20/19   Angiulli, Lavon Paganini, PA-C    Allergies    Fosamax [alendronate sodium] and Prednisone  Review of Systems   Review of Systems  All other systems reviewed and are negative.   Physical Exam Updated Vital Signs BP (!) 131/111 (BP Location: Right Arm)   Pulse 87   Temp (!) 97.4 F (36.3 C) (Oral)   Resp 17   Ht 5\' 5"  (1.651 m)   Wt 62.6 kg   SpO2 93%   BMI 22.96 kg/m   Physical Exam Vitals and nursing note reviewed.   86 year old female, resting comfortably and in no acute distress. Vital signs are significant for elevated blood pressure. Oxygen saturation is 93%, which is normal. Head is normocephalic and atraumatic. PERRLA, EOMI. Oropharynx is clear. Neck is nontender and supple without adenopathy or JVD. Back is nontender and there is no CVA tenderness. Lungs are clear without rales, wheezes, or rhonchi. Chest is nontender. Heart has regular  rate and rhythm without murmur. Abdomen is soft, flat, nontender without masses or hepatosplenomegaly and peristalsis is normoactive. Extremities: Left leg is slightly shortened and slightly externally rotated.  There is mild pain with external rotation of the left hip, but not with flexion and extension.  Laceration is noted on the left fifth finger radial aspect.  Laceration extends from the  proximal phalanx to the middle phalanx.  Distal neurovascular exam is intact.  Flexion contracture is present on the left arm. Skin is warm and dry without rash. Neurologic: Mental status is normal, cranial nerves are intact.  Left hemiparesis present which is more prominent in the left arm than the left leg.  ED Results / Procedures / Treatments   Labs (all labs ordered are listed, but only abnormal results are displayed) Labs Reviewed  SARS CORONAVIRUS 2 BY RT PCR [facility] ORDER, [place] [facility])    Radiology DG Hip Unilat W or Wo Pelvis 2-3 Views Left  Result Date: 12/29/2019 CLINICAL DATA:  Fall with left-sided hip pain EXAM: DG HIP (WITH OR WITHOUT PELVIS) 2-3V LEFT COMPARISON:  10/14/2016 FINDINGS: Both femoral heads project in joint. On the view of the pelvis, the left femoral neck is obscured by the trochanter but appears more normal on the dedicated view of the left hip. There is an acute displaced fracture involving the left inferior pubic ramus with additional acute mildly displaced left superior pubic ramus fracture with fracture extending into the left pubic tubercle. IMPRESSION: Acute displaced fractures involving the left superior and inferior pubic rami with extension of fracture lucency to the left pubic tubercle. Electronically Signed   By: Donavan Foil M.D.   On: 12/29/2019 03:01    Procedures .Marland KitchenLaceration Repair  Date/Time: 12/29/2019 5:41 AM Performed by: Delora Fuel, MD Authorized by: Delora Fuel, MD   Consent:    Consent obtained:  Verbal   Consent  given by:  Patient   Risks discussed:  Infection and pain   Alternatives discussed:  No treatment Anesthesia (see MAR for exact dosages):    Anesthesia method:  Local infiltration   Local anesthetic:  Lidocaine 2% w/o epi Laceration details:    Location:  Finger   Finger location:  L small finger   Length (cm):  3   Depth (mm):  2 Repair type:    Repair type:  Simple Pre-procedure details:    Preparation:  Patient was prepped and draped in usual sterile fashion Exploration:    Wound exploration: entire depth of wound probed and visualized     Wound extent: no foreign bodies/material noted and no tendon damage noted     Contaminated: no   Treatment:    Area cleansed with:  Saline   Amount of cleaning:  Standard Skin repair:    Repair method:  Sutures   Suture size:  4-0   Suture material:  Prolene   Suture technique:  Simple interrupted   Number of sutures:  6 Approximation:    Approximation:  Close Post-procedure details:    Dressing:  Antibiotic ointment   Patient tolerance of procedure:  Tolerated well, no immediate complications   Medications Ordered in ED Medications  lidocaine HCl (PF) (XYLOCAINE) 2 % injection 10 mL (has no administration in time range)    ED Course  I have reviewed the triage vital signs and the nursing notes.  Pertinent labs & imaging results that were available during my care of the patient were reviewed by me and considered in my medical decision making (see chart for details).  MDM Rules/Calculators/A&P Fall with injury to left hip and laceration of left fifth finger.  Old records are reviewed showing tetanus immunization being given in 2020.  She is sent for x-rays of her left hip and laceration is closed with sutures.  X-rays show evidence of pelvis fracture.  Attempt is made to ambulate  the patient and she was unable to tolerate this.  Will request consultation with transitions of care team to try to get skilled nursing home  placement.  Final Clinical Impression(s) / ED Diagnoses Final diagnoses:  Fall at home, initial encounter  Closed fracture of left pelvis, initial encounter ([place])  Laceration of left little finger, initial encounter    Rx / DC Orders ED Discharge Orders    None       Delora Fuel, MD 25/67/20 [phone]

## 2019-12-29 NOTE — TOC Initial Note (Signed)
Transition of Care [facility]) - Initial/Assessment Note   Patient Details  Name: Danielle [name] MRN: [id number] Date of Birth: [birth year]  [facility] CM/SW Contact:    Sherie Don, LCSW Phone Number: 12/29/2019, 3:07 PM  Clinical Narrative: Patient is an 86 year old female who is under observation for closed fracture of multiple pubic rami (left) following a fall at home. Patient has a history of uncontrolled type 2 diabetes mellitus with diabetic nephropathy, hypertension, and history of CVA with residual deficit. TOC received consult for SNF placement and PT evaluation recommended SNF. CSW met with patient in ED. Patient reported she preferred to discharge home with [facility] rather than go to SNF as she does not meet criteria for CIR.  CSW made referrals to [facility], [facility], [facility], and [facility], but none of the agencies are able to accept the patient at this time. CSW spoke with patient and her husband, Danielle [name], regarding not being able to get [facility]. Patient and husband agreeable to OP PT referral. Family to set up PCS for 24/7 care for the patient at home. CSW discussed rolling walker with trough, which was recommended by PT. Patient agreeable to signing the form, but requested that CSW wait to make referral as she is unsure if she wants the walker. OP PT orders entered. Hospitalist updated. TOC to follow.  Expected Discharge Plan: OP Rehab Barriers to Discharge: No [place] will accept this patient, Continued Medical Work up  Patient Goals and CMS Choice Patient states their goals for this hospitalization and ongoing recovery are:: Return home CMS Medicare.gov Compare Post Acute Care list provided to:: Patient Choice offered to / list presented to : Patient  Expected Discharge Plan and Services Expected Discharge Plan: OP Rehab In-house Referral: Clinical Social Work Discharge Planning Services: NA Post Acute Care Choice: NA Living arrangements for the  past 2 months: Single Family Home             DME Arranged: Walker rolling DME Agency: [facility] HH Arranged: NA HH Agency: NA Date HH Agency Contacted: 12/29/19  Prior Living Arrangements/Services Living arrangements for the past 2 months: Single Family Home Lives with:: Spouse Patient language and need for interpreter reviewed:: Yes Do you feel safe going back to the place where you live?: Yes      Need for Family Participation in Patient Care: Yes (Comment) Care giver support system in place?: Yes (comment) Criminal Activity/Legal Involvement Pertinent to Current Situation/Hospitalization: No - Comment as needed  Permission Sought/Granted Permission sought to share information with : Family Supports, Chartered certified accountant granted to share info w AGENCY: [place] providers; OP PT  Emotional Assessment Appearance:: Appears stated age Attitude/Demeanor/Rapport: Engaged Affect (typically observed): Accepting Orientation: : Oriented to Self, Oriented to Place, Oriented to  Time, Oriented to Situation Alcohol / Substance Use: Not Applicable Psych Involvement: No (comment)  Admission diagnosis:  Bilateral fracture of pubic rami ([place]) [S32.591A, S32.592A] Patient Active Problem List   Diagnosis Date Noted  . Closed fracture of multiple pubic rami, left, initial encounter ([place]) 12/29/2019  . Fall at home, initial encounter 12/29/2019  . Breast pain, left 12/22/2019  . History of CVA with residual deficit 11/10/2019  . Osteoporosis 04/19/2019  . Hyperlipidemia 04/19/2019  . Carotid stenosis, bilateral 12/30/2018  . Hypothyroidism (acquired) 05/03/2018  . Hypertension 05/03/2018  . Uncontrolled type 2 diabetes mellitus with diabetic nephropathy, with long-term current use of insulin ([place])    PCP:  Perlie Mayo, NP  Pharmacy:   [id number] [place]Phone: [phone]:  [address]Phone: [phone]: [phone] Readmission Risk Interventions No flowsheet data found.

## 2019-12-29 NOTE — Plan of Care (Signed)
  Problem: Acute Rehab PT Goals(only PT should resolve) Goal: Pt Will Go Supine/Side To Sit Outcome: Progressing Flowsheets (Taken 12/29/2019 1408) Pt will go Supine/Side to Sit:  with minimal assist  with moderate assist Goal: Patient Will Transfer Sit To/From Stand Outcome: Progressing Flowsheets (Taken 12/29/2019 1408) Patient will transfer sit to/from stand:  with minimal assist  with moderate assist Goal: Pt Will Transfer Bed To Chair/Chair To Bed Outcome: Progressing Flowsheets (Taken 12/29/2019 1408) Pt will Transfer Bed to Chair/Chair to Bed: with mod assist Goal: Pt Will Ambulate Outcome: Progressing Flowsheets (Taken 12/29/2019 1408) Pt will Ambulate:  15 feet  with moderate assist  with rolling walker Note: RW with left platform or hemi-walker   2:10 PM, 12/29/19 Lonell Grandchild, MPT Physical Therapist with [facility] [phone] office [phone] mobile phone

## 2019-12-29 NOTE — Progress Notes (Signed)
°  Imaging studies reviewed with orthopedic surgeon Dr. Arther Abbott --- Recommends nonoperative, conservative treatment -Follow-up as outpatient with Dr. Arther Abbott in 1 month for recheck and reevaluation Roxan Hockey, MD

## 2019-12-29 NOTE — ED Provider Notes (Signed)
  Provider Note MRN:  [id number]  Arrival date & time: 12/29/19    ED Course and Medical Decision Making  Assumed care from Dr. Roxanne Mins at shift change.  Fall, pelvis fractures, will attempt SNF placement but would consider admission.  Admitting to hospitalist service for further care, will consult orthopedics as well. Procedures  Final Clinical Impressions(s) / ED Diagnoses     ICD-10-CM   1. Fall at home, initial encounter  W19.XXXA    Y92.009   2. Closed fracture of left pelvis, initial encounter ([place])  S32.9XXA   3. Laceration of left little finger, initial encounter  P77.939S     ED Discharge Orders    None      Discharge Instructions   None     Barth Kirks. [place] Small, [place] [email or web address]    Maudie Flakes, MD 12/29/19 [phone]

## 2019-12-29 NOTE — Evaluation (Signed)
Physical Therapy Evaluation Patient Details Name: Danielle [name] MRN: [id number] DOB: March 15, 1932 Today's Date: 12/29/2019   History of Present Illness  Danielle [name] is a 86 y.o. female who has history of hypertension, diabetes, hyperlipidemia, stroke with residual left hemiparesis and comes in following a fall at home.  She injured her left hip and was not able to get up.  She is not actually complaining of pain in her hip, just some mild soreness.  She also suffered a laceration to her left fifth finger.  She does not know when her last tetanus immunization was.  She is not on any anticoagulants and denies any head injury.    Clinical Impression  Patient presents with dressing to left little finger with some bleeding noted.  Patient demonstrates slow labored movement for sitting up at bedside with no use of LUE due to pain/weakness, at severe risk for falls and limited to a couple of slow labored side steps requiring tactile cueing to move LLE due to c/o severe pain.  Patient able to stand with supported at elbow, but unable to take steps away from bedside due to fall risk.  Patient will benefit from continued physical therapy in hospital and recommended venue below to increase strength, balance, endurance for safe ADLs and gait.     Follow Up Recommendations SNF;Supervision - Intermittent;Supervision for mobility/OOB    Equipment Recommendations  Rolling walker with 5" wheels;Other (comment) (trough (platform walker) for LUE)    Recommendations for Other Services       Precautions / Restrictions Precautions Precautions: Fall Precaution Comments: laceration left little finger Restrictions Weight Bearing Restrictions: No      Mobility  Bed Mobility Overal bed mobility: Needs Assistance Bed Mobility: Supine to Sit;Sit to Supine     Supine to sit: Mod assist;Max assist Sit to supine: Mod assist;Max assist      Transfers Overall transfer level: Needs  assistance Equipment used: Rolling walker (2 wheeled);1 person hand held assist Transfers: Sit to/from Stand Sit to Stand: Mod assist;Max assist         General transfer comment: limited use of left hand due to pain, weakness from old CVA  Ambulation/Gait Ambulation/Gait assistance: Max assist Gait Distance (Feet): 2 Feet Assistive device: Rolling walker (2 wheeled);1 person hand held assist Gait Pattern/deviations: Decreased step length - right;Decreased step length - left;Decreased stride length;Shuffle Gait velocity: slow   General Gait Details: limited to 1-2 very slow unsteady suffling steps due to c/o severe pain in groin area  Stairs            Wheelchair Mobility    Modified Rankin (Stroke Patients Only)       Balance Overall balance assessment: Needs assistance Sitting-balance support: Single extremity supported;Feet supported Sitting balance-Leahy Scale: Poor Sitting balance - Comments: fair/poor seated at EOB   Standing balance support: Bilateral upper extremity supported;During functional activity Standing balance-Leahy Scale: Poor Standing balance comment: able to hold onto RW with right hand but had to be supported at left elbow due to LUE weakness, pain in hand                             Pertinent Vitals/Pain Pain Assessment: Faces Faces Pain Scale: Hurts whole lot Pain Location: pelvic area and left hand Pain Descriptors / Indicators: Grimacing;Guarding;Sharp;Sore Pain Intervention(s): Limited activity within patient's tolerance;Monitored during session;Repositioned    Home Living Family/patient expects to be discharged to:: Private residence Living Arrangements: Spouse/significant  other;Children Available Help at Discharge: Family;Available 24 hours/day Type of Home: House Home Access: Stairs to enter Entrance Stairs-Rails: None Entrance Stairs-Number of Steps: 2 Home Layout: One level Home Equipment: Walker - 2 wheels;Cane -  single point;Bedside commode;Shower seat;Wheelchair - manual      Prior Function Level of Independence: Needs assistance   Gait / Transfers Assistance Needed: household ambulator using RW fitted with brace for left hand due to CVA  ADL's / Homemaking Assistance Needed: assisted by family        Hand Dominance   Dominant Hand: Right    Extremity/Trunk Assessment   Upper Extremity Assessment Upper Extremity Assessment: Defer to OT evaluation    Lower Extremity Assessment Lower Extremity Assessment: Generalized weakness    Cervical / Trunk Assessment Cervical / Trunk Assessment: Normal  Communication   Communication: No difficulties  Cognition Arousal/Alertness: Awake/alert Behavior During Therapy: WFL for tasks assessed/performed Overall Cognitive Status: Within Functional Limits for tasks assessed                                        General Comments      Exercises     Assessment/Plan    PT Assessment Patient needs continued PT services  PT Problem List Decreased strength;Decreased activity tolerance;Decreased balance;Decreased mobility       PT Treatment Interventions DME instruction;Gait training;Stair training;Functional mobility training;Therapeutic activities;Therapeutic exercise;Patient/family education    PT Goals (Current goals can be found in the Care Plan section)  Acute Rehab PT Goals Patient Stated Goal: return home with family to assist PT Goal Formulation: With patient Time For Goal Achievement: 01/12/20 Potential to Achieve Goals: Good    Frequency Min 3X/week   Barriers to discharge        Co-evaluation               AM-PAC PT "6 Clicks" Mobility  Outcome Measure Help needed turning from your back to your side while in a flat bed without using bedrails?: A Lot Help needed moving from lying on your back to sitting on the side of a flat bed without using bedrails?: A Lot Help needed moving to and from a bed to  a chair (including a wheelchair)?: A Lot Help needed standing up from a chair using your arms (e.g., wheelchair or bedside chair)?: A Lot Help needed to walk in hospital room?: Total Help needed climbing 3-5 steps with a railing? : Total 6 Click Score: 10    End of Session   Activity Tolerance: Patient tolerated treatment well;Patient limited by fatigue;Patient limited by pain Patient left: in bed;with call bell/phone within reach Nurse Communication: Mobility status PT Visit Diagnosis: Unsteadiness on feet (R26.81);Other abnormalities of gait and mobility (R26.89);Muscle weakness (generalized) (M62.81);History of falling (Z91.81)    Time: 4166-0630 PT Time Calculation (min) (ACUTE ONLY): 20 min   Charges:   PT Evaluation $PT Eval Moderate Complexity: 1 Mod PT Treatments $Therapeutic Activity: 8-22 mins        2:06 PM, 12/29/19 Lonell Grandchild, MPT Physical Therapist with [facility] [phone] office [phone] mobile phone

## 2019-12-29 NOTE — H&P (Addendum)
Patient Demographics:    Danielle [name], is a 86 y.o. female  MRN: [id number]   DOB - 03/31/1932  Admit Date - 12/29/2019  Outpatient Primary MD for the patient is Perlie Mayo, NP   Assessment & Plan:    Principal Problem:   Closed fracture of multiple pubic rami, left, initial encounter [facility]) Active Problems:   Fall at home, initial encounter   Uncontrolled type 2 diabetes mellitus with diabetic nephropathy, with long-term current use of insulin ([place])   Hypertension   History of CVA with residual deficit    1)Closed Fracture of Left Pubic Rami--- Imaging studies reviewed with orthopedic surgeon Dr. Arther Abbott --- Recommends nonoperative, conservative treatment -Follow-up as outpatient with Dr. Arther Abbott in 1 month for recheck and reevaluation -Physical therapist recommends SNF rehab -IV and oral opiates as needed muscle relaxants as prescribed -Continue calcium vitamin D  2)S/p Fall--- Fall precautions SNF rehab recommended by physical therapist  3)History of prior stroke with residual left-sided hemiparesis--- stroke was August 2020 ----Continue Lipitor and Plavix for secondary Stroke Prophylaxis  4)DM2--previously uncontrolled, no recent A1c available, -Use Novolog/Humalog Sliding scale insulin with Accu-Cheks/Fingersticks as ordered  -Continue Lantus 30 units nightly  5)Hypothyroidism--- stable, continue levothyroxine 100 mcg daily  6)HTN-stable, continue amlodipine 5 mg daily  7)Constipation--- laxatives as ordered  Disposition/Need for in-Hospital Stay- patient unable to be discharged at this time due to ----Patient with increasing pain requiring IV opiates for pain control,, physical therapist recommends SNF rehab due to fall risk, unsafe ambulating, -  Dispo: The patient is  from: Home              Anticipated d/c is to: SNF              Anticipated d/c date is: 2 days              Patient currently is not medically stable to d/c. Barriers: Not Clinically Stable- --Patient with increasing pain requiring IV opiates for pain control,, physical therapist recommends SNF rehab due to fall risk, unsafe ambulating, -  With History of - Reviewed by me  Past Medical History:  Diagnosis Date  . Arthritis    "some joint pain once in awhile" (05/26/2017)  . Back pain at L4-L5 level 06/14/2013  . Cataract   . CVA (cerebral vascular accident) ([place]) 2019  . Encounter for screening mammogram for malignant neoplasm of breast 04/19/2019  . Generalized weakness 06/09/2018  . Heart murmur   . History of kidney stones   . Hyperlipemia   . Hypertension   . Hypothyroidism (acquired)   . Muscle weakness (generalized) 12/17/2012  . Osteoporosis   . Pain in joint, shoulder region 12/17/2012  . Pneumothorax 02/05/2017   right-after fall  . Proximal humerus fracture 11/29/2012  . Rib fractures 02/05/2017   right side  . 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 Shoulder fracture 12/14/2012  . Stroke ([place])   .  Stroke-like symptoms 12/23/2018  . Type II diabetes mellitus ([place])       Past Surgical History:  Procedure Laterality Date  . AUGMENTATION MAMMAPLASTY Bilateral   . COSMETIC SURGERY    . DILATION AND CURETTAGE OF UTERUS    . EYE SURGERY    . FRACTURE SURGERY    . JOINT REPLACEMENT    . REVERSE SHOULDER ARTHROPLASTY Right 05/26/2017  . REVERSE SHOULDER ARTHROPLASTY Right 05/26/2017   Procedure: REVERSE RIGHT SHOULDER ARTHROPLASTY;  Surgeon: Meredith Pel, MD;  Location: [facility];  Service: Orthopedics;  Laterality: Right;  . TONSILLECTOMY    . TUBAL LIGATION      Chief Complaint  Patient presents with  . Fall      HPI:    Danielle [name]  is a 86 y.o. female with past medical history relevant for prior stroke in August 2020 with residual  right-sided hemiparesis, DM, HTN who presents to the ED post fall with complaint of Hip Pain -Apparently lost her balance while ambulating at home using a cane she fell backwards complains of laceration to left pinky finger, denies headache or head injury --- Pelvic X-rays consistent with left-sided pubic rami fractures-- Imaging studies reviewed with orthopedic surgeon Dr. Arther Abbott --- Recommends nonoperative, conservative treatment -Follow-up as outpatient with Dr. Arther Abbott in 1 month for recheck and reevaluation - -Patient with increasing pain requiring IV opiates for pain control,, physical therapist recommends SNF rehab due to fall risk, unsafe ambulating, - No fever  Or chills  No nausea, vomiting, diarrhea -Additional history obtained from husband at bedside -Glucose is 252, anion gap is 13 -WBC is 13, query stress related UA pending -Husband at bedside, additional history obtained from husband   Review of systems:    In addition to the HPI above,   A full Review of  Systems was done, all other systems reviewed are negative except as noted above in HPI , .    Social History:  Reviewed by me    Social History   Tobacco Use  . Smoking status: Former Smoker    Packs/day: 1.00    Years: 40.00    Pack years: 40.00    Types: Cigarettes    Quit date: 1991    Years since quitting: 30.6  . Smokeless tobacco: Never Used  Substance Use Topics  . Alcohol use: No     Family History :  Reviewed by me   Family History  Problem Relation Age of Onset  . Cancer Mother   . Heart disease Father      Home Medications:   Prior to Admission medications   Medication Sig Start Date End Date Taking? Authorizing Provider  acetaminophen (TYLENOL) 325 MG tablet Take 2 tablets (650 mg total) by mouth every 6 (six) hours as needed for mild pain (or Fever >/= 101). 01/20/19  Yes Angiulli, Lavon Paganini, PA-C  amLODipine (NORVASC) 5 MG tablet Take 1 tablet (5 mg total) by mouth  daily. 01/20/19 01/20/20 Yes Angiulli, Lavon Paganini, PA-C  atorvastatin (LIPITOR) 80 MG tablet TAKE 1 TABLET (80 MG TOTAL) BY MOUTH DAILY AT 6 PM. 08/15/19  Yes Corum, Rex Kras, MD  Calcium Carb-Cholecalciferol (CALCIUM 600 + D PO) Take 1 tablet by mouth daily.   Yes [provider]  clopidogrel (PLAVIX) 75 MG tablet TAKE 1 TABLET BY MOUTH EVERY DAY 08/15/19  Yes Corum, Rex Kras, MD  Insulin Glargine, 2 Unit Dial, (TOUJEO MAX SOLOSTAR) 300 UNIT/ML SOPN Inject 30 Units into the  skin daily before breakfast.    Yes [provider]  Insulin Lispro (HUMALOG KWIKPEN) 200 UNIT/ML SOPN Inject 12 Units into the skin in the morning, at noon, and at bedtime. 10-12 units BID    Yes [provider]  Multiple Vitamins-Minerals (OCUVITE ADULT 50+ PO) Take 1 tablet by mouth daily.    Yes [provider]  SYNTHROID 100 MCG tablet Take 1 tablet (100 mcg total) by mouth every morning. 01/20/19  Yes Angiulli, Lavon Paganini, PA-C  vitamin B-12 (CYANOCOBALAMIN) 1000 MCG tablet Take 1 tablet (1,000 mcg total) by mouth daily. 01/20/19  Yes Angiulli, Lavon Paganini, PA-C  diclofenac sodium (VOLTAREN) 1 % GEL Apply 2 g topically 4 (four) times daily. 01/20/19   Angiulli, Lavon Paganini, PA-C  senna-docusate (SENOKOT-S) 8.6-50 MG tablet Take 2 tablets by mouth at bedtime. Patient taking differently: Take 2 tablets by mouth at bedtime as needed.  01/20/19   Angiulli, Lavon Paganini, PA-C    Allergies:     Allergies  Allergen Reactions  . Fosamax [Alendronate Sodium] Other (See Comments)    MUSCLE ACHES  . Prednisone     Feels jittery and nauseous    Physical Exam:   Vitals  Blood pressure (!) 132/57, pulse 96, temperature 98.2 F (36.8 C), resp. rate 16, height 5\' 5"  (1.651 m), weight 62.6 kg, SpO2 93 %.  Physical Examination: General appearance - alert, somewhat uncomfortable mental status - alert, oriented to person, place, and time,  Eyes - sclera anicteric Neck - supple, no JVD elevation , Chest - clear  to  auscultation bilaterally, symmetrical air movement,  Heart - S1 and S2 normal, regular  Abdomen - soft, nontender, nondistended, no masses or organomegaly Neurological - screening mental status exam normal, neck supple without rigidity, cranial nerves II through XII intact,  -Left upper extremity weakness and contracture which is not new -Residual left lower extremity weakness which is not new Extremities - no pedal edema noted, intact peripheral pulses  Skin - warm, dry    Data Review:    CBC Recent Labs  Lab 12/29/19 0747  WBC 13.0*  HGB 12.2  HCT 38.2  PLT 260  MCV 87.8  MCH 28.0  MCHC 31.9  RDW 16.5*   ------------------------------------------------------------------------------------------------------------------  Chemistries  Recent Labs  Lab 12/29/19 0747  NA 136  K 3.9  CL 105  CO2 18*  GLUCOSE 252*  BUN 22  CREATININE 0.82  CALCIUM 9.1  AST 40  ALT 34  ALKPHOS 78  BILITOT 1.1   estimated creatinine clearance is 45.1 mL/min (by C-G formula based on SCr of 0.82 mg/dL). ------------------------------------------------------------------------------------------------------------------ No results for input(s): TSH, T4TOTAL, T3FREE, THYROIDAB in the last 72 hours.  Invalid input(s): FREET3   Coagulation profile No results for input(s): INR, PROTIME in the last 168 hours. ------------------------------------------------------------------------------------------------------------------- No results for input(s): DDIMER in the last 72 hours. -------------------------------------------------------------------------------------------------------------------  Cardiac Enzymes No results for input(s): CKMB, TROPONINI, MYOGLOBIN in the last 168 hours.  Invalid input(s): CK ------------------------------------------------------------------------------------------------------------------ No results found for: BNP   Urinalysis    Component Value Date/Time    COLORURINE YELLOW 09/27/2018 1550   APPEARANCEUR CLOUDY (A) 09/27/2018 1550   LABSPEC 1.024 09/27/2018 1550   PHURINE 5.0 09/27/2018 1550   GLUCOSEU >=500 (A) 09/27/2018 [place] 09/27/2018 [place] 09/27/2018 [place] 09/27/2018 [place] 09/27/2018 1550   NITRITE NEGATIVE 09/27/2018 1550   LEUKOCYTESUR SMALL (A) 09/27/2018 1550  Imaging Results:    DG Hip Unilat W or Wo Pelvis 2-3 Views Left  Result Date: 12/29/2019 CLINICAL DATA:  Fall with left-sided hip pain EXAM: DG HIP (WITH OR WITHOUT PELVIS) 2-3V LEFT COMPARISON:  10/14/2016 FINDINGS: Both femoral heads project in joint. On the view of the pelvis, the left femoral neck is obscured by the trochanter but appears more normal on the dedicated view of the left hip. There is an acute displaced fracture involving the left inferior pubic ramus with additional acute mildly displaced left superior pubic ramus fracture with fracture extending into the left pubic tubercle. IMPRESSION: Acute displaced fractures involving the left superior and inferior pubic rami with extension of fracture lucency to the left pubic tubercle. Electronically Signed   By: Donavan Foil M.D.   On: 12/29/2019 03:01    Radiological Exams on Admission: DG Hip Unilat W or Wo Pelvis 2-3 Views Left  Result Date: 12/29/2019 CLINICAL DATA:  Fall with left-sided hip pain EXAM: DG HIP (WITH OR WITHOUT PELVIS) 2-3V LEFT COMPARISON:  10/14/2016 FINDINGS: Both femoral heads project in joint. On the view of the pelvis, the left femoral neck is obscured by the trochanter but appears more normal on the dedicated view of the left hip. There is an acute displaced fracture involving the left inferior pubic ramus with additional acute mildly displaced left superior pubic ramus fracture with fracture extending into the left pubic tubercle. IMPRESSION: Acute displaced fractures involving the left superior and inferior pubic  rami with extension of fracture lucency to the left pubic tubercle. Electronically Signed   By: Donavan Foil M.D.   On: 12/29/2019 03:01   DVT Prophylaxis -SCD  AM Labs Ordered, also please review Full Orders  Family Communication: Admission, patients condition and plan of care including tests being ordered have been discussed with the patient and husband who indicate understanding and agree with the plan   Code Status - Full Code  Likely DC to SNF   Condition   Stable---  Roxan Hockey M.D on 12/29/2019 at 6:37 PM Go to [email or web address] -  for contact info  [facility] - Office  [phone]

## 2019-12-29 NOTE — ED Notes (Signed)
Went in to dress pt finger. Husband was closing up bottle of pills and quickly shoved them into his pocket.  Husband questioning when social work will come in and discuss placement. Allowed them to know I was unsure but would be after 7 am

## 2019-12-29 NOTE — ED Triage Notes (Signed)
Pt states she lost her balance while using cane and fell backwards. Pt c/o left hip and has lac to the left pinky finger.

## 2019-12-30 DIAGNOSIS — S32592A Other specified fracture of left pubis, initial encounter for closed fracture: Secondary | ICD-10-CM | POA: Diagnosis not present

## 2019-12-30 LAB — URINALYSIS, MICROSCOPIC (REFLEX)
RBC / HPF: NONE SEEN RBC/hpf (ref 0–5)
Squamous Epithelial / HPF: NONE SEEN (ref 0–5)

## 2019-12-30 LAB — URINALYSIS, ROUTINE W REFLEX MICROSCOPIC
Bilirubin Urine: NEGATIVE
Glucose, UA: 250 mg/dL — AB
Hgb urine dipstick: NEGATIVE
Ketones, ur: NEGATIVE mg/dL
Leukocytes,Ua: NEGATIVE
Nitrite: NEGATIVE
Protein, ur: NEGATIVE mg/dL
Specific Gravity, Urine: >1.03 — ABNORMAL HIGH (ref 1.005–1.030)
pH: 5.5 (ref 5.0–8.0)

## 2019-12-30 LAB — GLUCOSE, CAPILLARY
Glucose-Capillary: 274 mg/dL — ABNORMAL HIGH (ref 70–99)
Glucose-Capillary: 230 mg/dL — ABNORMAL HIGH (ref 70–99)
Glucose-Capillary: 252 mg/dL — ABNORMAL HIGH (ref 70–99)
Glucose-Capillary: 215 mg/dL — ABNORMAL HIGH (ref 70–99)

## 2019-12-30 NOTE — Progress Notes (Signed)
Patient Demographics:    Danielle [name], is a 86 y.o. female, DOB - 01-Apr-1932, MVE:[id number]  Admit date - 12/29/2019   Admitting Physician Stefanie Hodgens Denton Brick, MD  Outpatient Primary MD for the patient is Perlie Mayo, NP  LOS - 0   Chief Complaint  Patient presents with  . Fall        Subjective:    Danielle [name] today has no fevers, no emesis,  No chest pain,   Husband at bedside, questions answered, pelvic pain is not worse -IV Dilaudid helpful  Assessment  & Plan :    Principal Problem:   Closed fracture of multiple pubic rami, left, initial encounter [facility]) Active Problems:   Fall at home, initial encounter   Uncontrolled type 2 diabetes mellitus with diabetic nephropathy, with long-term current use of insulin ([place])   Hypertension   History of CVA with residual deficit     1)Closed Fracture of Left Pubic Rami--- Imaging studies reviewed with orthopedic surgeon Dr. Arther Abbott ---Recommends nonoperative, conservative treatment -Follow-up as outpatient with Dr. Arther Abbott in 1 month for recheck and reevaluation -Physical therapist recommends SNF rehab -IV and oral opiates as needed muscle relaxants as prescribed -Continue calcium vitamin D  2)S/p Fall--- Fall precautions SNF rehab recommended by physical therapist  3)History of prior stroke with residual left-sided hemiparesis--- stroke was August 2020 ----Continue Lipitor and Plavix for secondary Stroke Prophylaxis  4)DM2--previously uncontrolled, no recent A1c available, -Use Novolog/Humalog Sliding scale insulin with Accu-Cheks/Fingersticks as ordered  -Continue Lantus 30 units nightly  5)Hypothyroidism--- stable, continue levothyroxine 100 mcg daily  6)HTN-stable, continue amlodipine 5 mg daily  7)Constipation--- laxatives as ordered  Disposition/Need for in-Hospital Stay- patient unable to be  discharged at this time due to ----Patient with increasing pain requiring IV opiates for pain control,, physical therapist recommends SNF rehab due to fall risk, unsafe ambulating, -  Dispo: The patient is from: Home  Anticipated d/c is to: SNF  Anticipated d/c date is: 2 days  Patient currently is not medically stable to d/c. Barriers: Not Clinically Stable- --Patient with increasing pain requiring IV opiates for pain control,, physical therapist recommends SNF rehab due to fall risk, unsafe ambulating,   Code Status : full  Family Communication: Discussed with husband at bedside*  Consults  :  -Discussed with orthopedic surgeon  DVT Prophylaxis  :   - Heparin - SCDs *   Lab Results  Component Value Date   PLT 260 12/29/2019    Inpatient Medications  Scheduled Meds: . amLODipine  5 mg Oral Daily  . atorvastatin  80 mg Oral q1800  . calcium-vitamin D  1 tablet Oral Daily  . clopidogrel  75 mg Oral Daily  . insulin aspart  0-5 Units Subcutaneous QHS  . insulin aspart  0-9 Units Subcutaneous TID WC  . insulin glargine  30 Units Subcutaneous Daily  . levothyroxine  100 mcg Oral q morning - 10a  . methocarbamol  500 mg Oral TID  . polyethylene glycol  17 g Oral Daily  . senna-docusate  2 tablet Oral BID  . vitamin B-12  1,000 mcg Oral Daily   Continuous Infusions: PRN Meds:.acetaminophen, HYDROmorphone (DILAUDID) injection, oxyCODONE    Anti-infectives (From admission, onward)   None  Objective:   Vitals:   12/29/19 2005 12/29/19 2038 12/30/19 0001 12/30/19 0403  BP:  (!) 131/51 (!) 129/56 132/86  Pulse:  89 88 95  Resp:  16 16 16   Temp:  99.6 F (37.6 C) 98.6 F (37 C) 100.2 F (37.9 C)  TempSrc:  Oral Oral Oral  SpO2: (!) 88% 99% 97% 96%  Weight:      Height:        Wt Readings from Last 3 Encounters:  12/29/19 62.6 kg  11/22/19 66.8 kg  11/10/19 66.6 kg    No intake or output data in the 24 hours ending  12/30/19 1901   Physical Exam  Physical Examination: General appearance - alert,   e mental status - alert, oriented to person, place, and time,  Eyes - sclera anicteric Neck - supple, no JVD elevation , Chest - clear  to auscultation bilaterally, symmetrical air movement,  Heart - S1 and S2 normal, regular  Abdomen - soft, nontender, nondistended, no masses or organomegaly Neurological - screening mental status exam normal, neck supple without rigidity, cranial nerves II through XII intact,  -Left upper extremity weakness and contracture which is not new -Residual left lower extremity weakness which is not new Extremities - no pedal edema noted, intact peripheral pulses  Skin - warm, dry   Data Review:   Micro Results Recent Results (from the past 240 hour(s))  SARS Coronavirus 2 by RT PCR (hospital order, performed in [place] hospital lab) Nasopharyngeal Nasopharyngeal Swab     Status: None   Collection Time: 12/29/19  3:20 AM   Specimen: Nasopharyngeal Swab  Result Value Ref Range Status   SARS Coronavirus 2 NEGATIVE NEGATIVE Final    Comment: (NOTE) SARS-CoV-2 target nucleic acids are NOT DETECTED.  The SARS-CoV-2 RNA is generally detectable in upper and lower respiratory specimens during the acute phase of infection. The lowest concentration of SARS-CoV-2 viral copies this assay can detect is 250 copies / mL. A negative result does not preclude SARS-CoV-2 infection and should not be used as the sole basis for treatment or other patient management decisions.  A negative result may occur with improper specimen collection / handling, submission of specimen other than nasopharyngeal swab, presence of viral mutation(s) within the areas targeted by this assay, and inadequate number of viral copies (<250 copies / mL). A negative result must be combined with clinical observations, patient history, and epidemiological information.  Fact Sheet for Patients:    StrictlyIdeas.no  Fact Sheet for Healthcare Providers: BankingDealers.co.za  This test is not yet approved or  cleared by the [place] FDA and has been authorized for detection and/or diagnosis of SARS-CoV-2 by FDA under an Emergency Use Authorization (EUA).  This EUA will remain in effect (meaning this test can be used) for the duration of the COVID-19 declaration under Section 564(b)(1) of the Act, 21 U.S.C. section 360bbb-3(b)(1), unless the authorization is terminated or revoked sooner.  Performed at [facility], [address], [place], [place] [address]     Radiology Reports DG Hip [place] or [place] Pelvis 2-3 Views Left  Result Date: 12/29/2019 CLINICAL DATA:  Fall with left-sided hip pain EXAM: DG HIP (WITH OR WITHOUT PELVIS) 2-3V LEFT COMPARISON:  10/14/2016 FINDINGS: Both femoral heads project in joint. On the view of the pelvis, the left femoral neck is obscured by the trochanter but appears more normal on the dedicated view of the left hip. There is an acute displaced fracture involving the left inferior  pubic ramus with additional acute mildly displaced left superior pubic ramus fracture with fracture extending into the left pubic tubercle. IMPRESSION: Acute displaced fractures involving the left superior and inferior pubic rami with extension of fracture lucency to the left pubic tubercle. Electronically Signed   By: Donavan Foil M.D.   On: 12/29/2019 03:01     CBC Recent Labs  Lab 12/29/19 0747  WBC 13.0*  HGB 12.2  HCT 38.2  PLT 260  MCV 87.8  MCH 28.0  MCHC 31.9  RDW 16.5*    Chemistries  Recent Labs  Lab 12/29/19 0747  NA 136  K 3.9  CL 105  CO2 18*  GLUCOSE 252*  BUN 22  CREATININE 0.82  CALCIUM 9.1  AST 40  ALT 34  ALKPHOS 78  BILITOT 1.1   ------------------------------------------------------------------------------------------------------------------ No results for input(s): CHOL, HDL,  LDLCALC, TRIG, CHOLHDL, LDLDIRECT in the last 72 hours.  Lab Results  Component Value Date   HGBA1C 10.3 (H) 12/23/2018   ------------------------------------------------------------------------------------------------------------------ No results for input(s): TSH, T4TOTAL, T3FREE, THYROIDAB in the last 72 hours.  Invalid input(s): FREET3 ------------------------------------------------------------------------------------------------------------------ No results for input(s): VITAMINB12, FOLATE, FERRITIN, TIBC, IRON, RETICCTPCT in the last 72 hours.  Coagulation profile No results for input(s): INR, PROTIME in the last 168 hours.  No results for input(s): DDIMER in the last 72 hours.  Cardiac Enzymes No results for input(s): CKMB, TROPONINI, MYOGLOBIN in the last 168 hours.  Invalid input(s): CK ------------------------------------------------------------------------------------------------------------------ No results found for: BNP   Roxan Hockey M.D on 12/30/2019 at 7:01 PM  Go to [email or web address] - for contact info  [facility] - Office  [phone]

## 2019-12-30 NOTE — [place] FL2 (Signed)
[place] MEDICAID FL2 LEVEL OF CARE SCREENING TOOL     IDENTIFICATION  Patient Name: Danielle [name] Birthdate: Jul 19, 1931 Sex: female Admission Date (Current Location): 12/29/2019  [facility] and [place] Number:  [employer] and Address:  [place] [address] [place]      Provider Number: [id number]  Attending Physician Name and Address:  Roxan Hockey, MD  Relative Name and Phone Number:       Current Level of Care: Hospital Recommended Level of Care: [place] Prior Approval Number:    Date Approved/Denied:   PASRR Number: [id number]  Discharge Plan: SNF    Current Diagnoses: Patient Active Problem List   Diagnosis Date Noted  . Closed fracture of multiple pubic rami, left, initial encounter ([place]) 12/29/2019  . Fall at home, initial encounter 12/29/2019  . Breast pain, left 12/22/2019  . History of CVA with residual deficit 11/10/2019  . Osteoporosis 04/19/2019  . Hyperlipidemia 04/19/2019  . Carotid stenosis, bilateral 12/30/2018  . Hypothyroidism (acquired) 05/03/2018  . Hypertension 05/03/2018  . Uncontrolled type 2 diabetes mellitus with diabetic nephropathy, with long-term current use of insulin (HCC)     Orientation RESPIRATION BLADDER Height & Weight     Self, Time, Situation, Place  Normal Continent Weight: 138 lb (62.6 kg) Height:  5\' 5"  (165.1 cm)  BEHAVIORAL SYMPTOMS/MOOD NEUROLOGICAL BOWEL NUTRITION STATUS      Continent Diet (see dc summary)  AMBULATORY STATUS COMMUNICATION OF NEEDS Skin   Extensive Assist Verbally Normal                       Personal Care Assistance Level of Assistance  Bathing, Feeding, Dressing Bathing Assistance: Limited assistance Feeding assistance: Independent Dressing Assistance: Limited assistance     Functional Limitations Info  Sight, Hearing, Speech Sight Info: Adequate Hearing Info: Adequate Speech Info: Adequate    SPECIAL CARE  FACTORS FREQUENCY  PT (By licensed PT), OT (By licensed OT)     PT Frequency: 5x week OT Frequency: 3x week            Contractures Contractures Info: Not present    Additional Factors Info  Code Status, Allergies Code Status Info: Full Allergies Info: Fosamax, Prednisone           Current Medications (12/30/2019):  This is the current hospital active medication list Current Facility-Administered Medications  Medication Dose Route Frequency Provider Last Rate Last Admin  . acetaminophen (TYLENOL) tablet 650 mg  650 mg Oral Q6H PRN Emokpae, Courage, MD   650 mg at 12/29/19 1310  . amLODipine (NORVASC) tablet 5 mg  5 mg Oral Daily Emokpae, Courage, MD   5 mg at 12/30/19 0812  . atorvastatin (LIPITOR) tablet 80 mg  80 mg Oral q1800 Emokpae, Courage, MD   80 mg at 12/29/19 1642  . calcium-vitamin D (OSCAL WITH D) 500-200 MG-UNIT per tablet 1 tablet  1 tablet Oral Daily Roxan Hockey, MD   1 tablet at 12/30/19 0811  . clopidogrel (PLAVIX) tablet 75 mg  75 mg Oral Daily Emokpae, Courage, MD   75 mg at 12/30/19 0813  . HYDROmorphone (DILAUDID) injection 0.5 mg  0.5 mg Intravenous Q2H PRN Roxan Hockey, MD   0.5 mg at 12/29/19 2149  . insulin aspart (novoLOG) injection 0-5 Units  0-5 Units Subcutaneous QHS Roxan Hockey, MD   2 Units at 12/29/19 2148  . insulin aspart (novoLOG) injection 0-9 Units  0-9 Units Subcutaneous TID  WC Roxan Hockey, MD   3 Units at 12/30/19 1224  . insulin glargine (LANTUS) injection 30 Units  30 Units Subcutaneous Daily Roxan Hockey, MD   30 Units at 12/30/19 1106  . levothyroxine (SYNTHROID) tablet 100 mcg  100 mcg Oral q morning - 10a Emokpae, Courage, MD   100 mcg at 12/30/19 0500  . methocarbamol (ROBAXIN) tablet 500 mg  500 mg Oral TID Roxan Hockey, MD   500 mg at 12/30/19 0814  . oxyCODONE (Oxy IR/ROXICODONE) immediate release tablet 5 mg  5 mg Oral Q4H PRN Emokpae, Courage, MD      . polyethylene glycol (MIRALAX / GLYCOLAX) packet 17  g  17 g Oral Daily Emokpae, Courage, MD   17 g at 12/30/19 0810  . senna-docusate (Senokot-S) tablet 2 tablet  2 tablet Oral BID Roxan Hockey, MD   2 tablet at 12/30/19 0817  . vitamin B-12 (CYANOCOBALAMIN) tablet 1,000 mcg  1,000 mcg Oral Daily Roxan Hockey, MD   1,000 mcg at 12/30/19 0815     Discharge Medications: Please see discharge summary for a list of discharge medications.  Relevant Imaging Results:  Relevant Lab Results:   Additional Information SSN: [id number] LCSW

## 2019-12-30 NOTE — Plan of Care (Signed)
°  Problem: Acute Rehab OT Goals (only OT should resolve) Goal: Pt. Will Perform Eating Flowsheets (Taken 12/30/2019 0959) Pt Will Perform Eating:  with set-up  sitting Goal: Pt. Will Perform Grooming Flowsheets (Taken 12/30/2019 0959) Pt Will Perform Grooming:  with set-up  sitting Goal: Pt. Will Perform Upper Body Dressing Flowsheets (Taken 12/30/2019 0959) Pt Will Perform Upper Body Dressing:  with min assist  sitting Goal: Pt. Will Transfer To Toilet Flowsheets (Taken 12/30/2019 0959) Pt Will Transfer to Toilet:  with min guard assist  stand pivot transfer  bedside commode Goal: Pt. Will Perform Toileting-Clothing Manipulation Flowsheets (Taken 12/30/2019 0959) Pt Will Perform Toileting - Clothing Manipulation and hygiene:  with min assist  sitting/lateral leans  sit to/from stand Goal: Pt/Caregiver Will Perform Home Exercise Program Flowsheets (Taken 12/30/2019 [id number]) Pt/caregiver will Perform Home Exercise Program:  Increased strength  Right Upper extremity  Independently  With written HEP provided

## 2019-12-30 NOTE — Progress Notes (Signed)
Inpatient Diabetes Program Recommendations  AACE/ADA: New Consensus Statement on Inpatient Glycemic Control (2015)  Target Ranges:  Prepandial:   less than 140 mg/dL      Peak postprandial:   less than 180 mg/dL (1-2 hours)      Critically ill patients:  140 - 180 mg/dL   Lab Results  Component Value Date   GLUCAP 274 (H) 12/30/2019   HGBA1C 10.3 (H) 12/23/2018    Review of Glycemic Control  Results for Danielle, [name] (MRN [id number]) as of 12/30/2019 11:34  Ref. Range 12/29/2019 09:29 12/29/2019 11:30 12/29/2019 16:49 12/29/2019 20:24 12/30/2019 07:23  Glucose-Capillary Latest Ref Range: 70 - 99 mg/dL 269 (H) 293 (H) 306 (H) 210 (H) 274 (H)   Diabetes history:  DM2  Outpatient Diabetes medications:  Toujeo 40 units daily  Humalog 10-12 units TID with meals  Current orders for Inpatient glycemic control:  Novolog 0-9 units TID  Novolog 0-5 units QHS Lantus 30 units daily  Inpatient Diabetes Program Recommendations:    Please consider increasing Lantus to home dose of 40 units daily & adding meal coverage 5 units TID with meals if eats at least 50% of meal  Will continue to follow while inpatient.  Thank you, Reche Dixon, RN, BSN Diabetes Coordinator Inpatient Diabetes Program [phone] (team pager from 8a-5p)

## 2019-12-30 NOTE — TOC Progression Note (Addendum)
Transition of Care [facility]) - Progression Note    Patient Details  Name: Danielle [name] MRN: [id number] Date of Birth: August 01, 1931  Transition of [facility]) CM/SW Contact  Shade Flood, LCSW Phone Number: 12/30/2019, 12:40 PM  Clinical Narrative:     TOC following. Notified by MD that pt now requesting SNF placement. Met with pt and husband and bedside. Offered CMS SNF option list. Will refer as requested. Insurance auth started. TOC will follow.  Pt has chosen [facility]. They can take her on Monday. Pt will need new covid test prior to dc. Updated MD.  Expected Discharge Plan: OP Rehab Barriers to Discharge: SNF Pending bed offer, Insurance Authorization  Expected Discharge Plan and Services Expected Discharge Plan: OP Rehab In-house Referral: Clinical Social Work Discharge Planning Services: NA Post Acute Care Choice: [place] Living arrangements for the past 2 months: Single Family Home                 DME Arranged: [place] rolling DME Agency: [employer]       HH Arranged: NA HH Agency: NA Date HH Agency Contacted: 12/29/19       Social Determinants of Health (SDOH) Interventions    Readmission Risk Interventions No flowsheet data found.

## 2019-12-30 NOTE — Evaluation (Signed)
Occupational Therapy Evaluation Patient Details Name: Danielle [name] MRN: [id number] DOB: 1931/07/31 Today's Date: 12/30/2019    History of Present Illness Danielle [name] is a 86 y.o. female who has history of hypertension, diabetes, hyperlipidemia, stroke with residual left hemiparesis and comes in following a fall at home.  She injured her left hip and was not able to get up.  She is not actually complaining of pain in her hip, just some mild soreness.  She also suffered a laceration to her left fifth finger.  She does not know when her last tetanus immunization was.  She is not on any anticoagulants and denies any head injury.   Clinical Impression   Pt agreeable to OT evaluation this am. Pt is well known to this clinician from history of extensive therapy after previous CVAs. Pt with left hemiparesis at baseline, is unable to use LUE functionally. Pt with significant pain in left wrist this am-not baseline, RN aware. Pt is currently requiring max to total assist for mobility tasks, is poorly motivated to assist with tasks due to pain. Pt is able to complete basic feeding and grooming tasks with right hand with set-up, max to total assist for all other ADLs. Extensive discussion with pt and husband, husband is unable to provide necessary level of physical assistance and does not have other family or friends who are able to assist. Strongly recommend SNF on discharge to improve pt safety and independence in ADLs, husband and pt agree. Will continue to follow acutely.     Follow Up Recommendations  SNF    Equipment Recommendations  None recommended by OT       Precautions / Restrictions Precautions Precautions: Fall Restrictions Weight Bearing Restrictions: No      Mobility Bed Mobility                  Transfers Overall transfer level: Needs assistance   Transfers: Lateral/Scoot Transfers          Lateral/Scoot Transfers: Max assist General transfer comment:  pt scooting up in bed and to the left with max assist         ADL either performed or assessed with clinical judgement   ADL Overall ADL's : Needs assistance/impaired Eating/Feeding: Set up;Bed level Eating/Feeding Details (indicate cue type and reason): able to use RUE for bringing food to mouth Grooming: Set up;Bed level               Lower Body Dressing: Total assistance;Sitting/lateral leans   Toilet Transfer: Total assistance   Toileting- Clothing Manipulation and Hygiene: Total assistance         General ADL Comments: Pt is unable to use LUE to assist with ADLs at baseline.      Vision Baseline Vision/History: No visual deficits Patient Visual Report: No change from baseline Vision Assessment?: No apparent visual deficits            Pertinent Vitals/Pain Pain Assessment: Faces Faces Pain Scale: Hurts even more Pain Location: pelvic area and left hand Pain Descriptors / Indicators: Grimacing;Guarding;Sharp;Sore Pain Intervention(s): Limited activity within patient's tolerance;Monitored during session;Repositioned     Hand Dominance Right   Extremity/Trunk Assessment Upper Extremity Assessment Upper Extremity Assessment: LUE deficits/detail LUE Deficits / Details: baseline LUE strength 1/5 in hand to 3-/5 in elbow and shoulder LUE Coordination: decreased fine motor;decreased gross motor   Lower Extremity Assessment Lower Extremity Assessment: Defer to PT evaluation   Cervical / Trunk Assessment Cervical / Trunk Assessment: Normal  Communication Communication Communication: No difficulties   Cognition Arousal/Alertness: Awake/alert Behavior During Therapy: WFL for tasks assessed/performed Overall Cognitive Status: Within Functional Limits for tasks assessed                                                Home Living   Living Arrangements: Spouse/significant other Available Help at Discharge: Family;Available 24  hours/day Type of Home: House Home Access: Stairs to enter [employer] of Steps: 2 Entrance Stairs-Rails: None Home Layout: One level     Bathroom Shower/Tub: Teacher, early years/pre: Standard     Home Equipment: Environmental consultant - 2 wheels;Cane - single point;Bedside commode;Shower seat;Wheelchair - manual          Prior Functioning/Environment Level of Independence: Needs assistance  Gait / Transfers Assistance Needed: household ambulator using RW fitted with brace for left hand due to CVA ADL's / Homemaking Assistance Needed: assisted by family for all ADLs            OT Problem List: Decreased strength;Decreased activity tolerance;Impaired balance (sitting and/or standing);Decreased safety awareness;Decreased knowledge of use of DME or AE;Impaired UE functional use;Pain      OT Treatment/Interventions: Self-care/ADL training;Therapeutic exercise;DME and/or AE instruction;Therapeutic activities;Patient/family education    OT Goals(Current goals can be found in the care plan section) Acute Rehab OT Goals Patient Stated Goal: return home  OT Goal Formulation: With patient Time For Goal Achievement: 01/13/20 Potential to Achieve Goals: Good  OT Frequency: Min 2X/week   Barriers to D/C: Decreased caregiver support                End of Session Nurse Communication: Mobility status;Other (comment) (left wrist pain, significantly greater than baseline)  Activity Tolerance: Patient limited by pain Patient left: in bed;with call bell/phone within reach;with bed alarm set;with family/visitor present  OT Visit Diagnosis: Repeated falls (R29.6);Muscle weakness (generalized) (M62.81);Pain Pain - Right/Left: Left Pain - part of body:  (pelvis and hip)                Time: 9379-0240 OT Time Calculation (min): 30 min Charges:  OT General Charges $OT Visit: 1 Visit OT Evaluation $OT Eval Moderate Complexity: [place], OTR/L   [phone] 12/30/2019, 9:56 AM

## 2019-12-31 ENCOUNTER — Observation Stay (HOSPITAL_COMMUNITY): Payer: Medicare Other

## 2019-12-31 DIAGNOSIS — Z96611 Presence of right artificial shoulder joint: Secondary | ICD-10-CM | POA: Diagnosis present

## 2019-12-31 DIAGNOSIS — Z20822 Contact with and (suspected) exposure to covid-19: Secondary | ICD-10-CM | POA: Diagnosis present

## 2019-12-31 DIAGNOSIS — S52502A Unspecified fracture of the lower end of left radius, initial encounter for closed fracture: Secondary | ICD-10-CM | POA: Diagnosis not present

## 2019-12-31 DIAGNOSIS — Z79899 Other long term (current) drug therapy: Secondary | ICD-10-CM | POA: Diagnosis not present

## 2019-12-31 DIAGNOSIS — E1121 Type 2 diabetes mellitus with diabetic nephropathy: Secondary | ICD-10-CM | POA: Diagnosis present

## 2019-12-31 DIAGNOSIS — W19XXXA Unspecified fall, initial encounter: Secondary | ICD-10-CM | POA: Diagnosis not present

## 2019-12-31 DIAGNOSIS — E785 Hyperlipidemia, unspecified: Secondary | ICD-10-CM | POA: Diagnosis present

## 2019-12-31 DIAGNOSIS — Z79890 Hormone replacement therapy (postmenopausal): Secondary | ICD-10-CM | POA: Diagnosis not present

## 2019-12-31 DIAGNOSIS — Z87891 Personal history of nicotine dependence: Secondary | ICD-10-CM | POA: Diagnosis not present

## 2019-12-31 DIAGNOSIS — Y92009 Unspecified place in unspecified non-institutional (private) residence as the place of occurrence of the external cause: Secondary | ICD-10-CM | POA: Diagnosis not present

## 2019-12-31 DIAGNOSIS — S52182A Other fracture of upper end of left radius, initial encounter for closed fracture: Secondary | ICD-10-CM | POA: Diagnosis present

## 2019-12-31 DIAGNOSIS — Z7902 Long term (current) use of antithrombotics/antiplatelets: Secondary | ICD-10-CM | POA: Diagnosis not present

## 2019-12-31 DIAGNOSIS — I69354 Hemiplegia and hemiparesis following cerebral infarction affecting left non-dominant side: Secondary | ICD-10-CM | POA: Diagnosis not present

## 2019-12-31 DIAGNOSIS — Z888 Allergy status to other drugs, medicaments and biological substances status: Secondary | ICD-10-CM | POA: Diagnosis not present

## 2019-12-31 DIAGNOSIS — M810 Age-related osteoporosis without current pathological fracture: Secondary | ICD-10-CM | POA: Diagnosis present

## 2019-12-31 DIAGNOSIS — K5903 Drug induced constipation: Secondary | ICD-10-CM | POA: Diagnosis not present

## 2019-12-31 DIAGNOSIS — S61217A Laceration without foreign body of left little finger without damage to nail, initial encounter: Secondary | ICD-10-CM | POA: Diagnosis present

## 2019-12-31 DIAGNOSIS — W010XXA Fall on same level from slipping, tripping and stumbling without subsequent striking against object, initial encounter: Secondary | ICD-10-CM | POA: Diagnosis present

## 2019-12-31 DIAGNOSIS — E039 Hypothyroidism, unspecified: Secondary | ICD-10-CM | POA: Diagnosis present

## 2019-12-31 DIAGNOSIS — S32592A Other specified fracture of left pubis, initial encounter for closed fracture: Secondary | ICD-10-CM | POA: Diagnosis present

## 2019-12-31 DIAGNOSIS — I10 Essential (primary) hypertension: Secondary | ICD-10-CM | POA: Diagnosis present

## 2019-12-31 DIAGNOSIS — E1165 Type 2 diabetes mellitus with hyperglycemia: Secondary | ICD-10-CM | POA: Diagnosis present

## 2019-12-31 DIAGNOSIS — Z794 Long term (current) use of insulin: Secondary | ICD-10-CM | POA: Diagnosis not present

## 2019-12-31 LAB — GLUCOSE, CAPILLARY
Glucose-Capillary: 257 mg/dL — ABNORMAL HIGH (ref 70–99)
Glucose-Capillary: 271 mg/dL — ABNORMAL HIGH (ref 70–99)
Glucose-Capillary: 191 mg/dL — ABNORMAL HIGH (ref 70–99)
Glucose-Capillary: 178 mg/dL — ABNORMAL HIGH (ref 70–99)

## 2019-12-31 MED ORDER — INSULIN GLARGINE 100 UNIT/ML ~~LOC~~ SOLN
32.0000 [IU] | Freq: Every day | SUBCUTANEOUS | Status: DC
  Administered 2019-12-31 – 2020-01-02 (×3): 32 [IU] via SUBCUTANEOUS
  Filled 2019-12-31 (×4): qty 0.32

## 2019-12-31 NOTE — Progress Notes (Signed)
Patient Demographics:    Danielle [name], is a 86 y.o. female, DOB - [birth year], IEP:[id number]  Admit date - 12/29/2019   Admitting Physician Kamryn Gauthier Denton Brick, MD  Outpatient Primary MD for the patient is Perlie Mayo, NP  LOS - 0   Chief Complaint  Patient presents with  . Fall        Subjective:    Danielle [name] today has no fevers, no emesis,  No chest pain,   Husband at bedside, complains of increasing left wrist pain x-rays demonstrates acute fracture -Pain control from left upper extremity pain and pelvic/pubic area fracture remains challenging  Assessment  & Plan :    Principal Problem:   Closed fracture of multiple pubic rami, left, initial encounter [facility]) Active Problems:   Fall at home, initial encounter   Uncontrolled type 2 diabetes mellitus with diabetic nephropathy, with long-term current use of insulin ([place])   Hypertension   History of CVA with residual deficit   Other fracture of upper end of left radius, initial encounter for closed fracture--transverse fracture in the distal radial metaphysis with     1)Closed Fracture of Left Pubic Rami--- Imaging studies reviewed with orthopedic surgeon Dr. Arther Abbott ---Recommends nonoperative, conservative treatment -Follow-up as outpatient with Dr. Arther Abbott in 1 month for recheck and reevaluation -Physical therapist recommends SNF rehab -IV and oral opiates as needed muscle relaxants as prescribed -Continue calcium vitamin D  2)S/p Fall--- Fall precautions SNF rehab recommended by physical therapist  3)History of prior stroke with residual left-sided hemiparesis--- stroke was August 2020 ----Continue Lipitor and Plavix for secondary Stroke Prophylaxis  4)DM2--previously uncontrolled, no recent A1c available, -Use Novolog/Humalog Sliding scale insulin with Accu-Cheks/Fingersticks as ordered  -Continue  Lantus 30 units nightly  5)Hypothyroidism--- stable, continue levothyroxine 100 mcg daily  6)HTN-stable, continue amlodipine 5 mg daily  7)Constipation---  improved with laxatives as ordered  8) status post fall with left distal radius fracture----left wrist x-ray shows -transverse fracture in the distal radial metaphysis with impaction and posterior angulation at the fracture site --orthopedic consult requested splint/sling for now --Pain control as ordered   -Disposition:- PT/OT evaluations performed. SNF Rehab recommended. SNF appropriate as the patient has received 3 days of hospital care and is felt to need rehab services to restore this patient to their prior level of function to achieve safe transition back to home care. This patient needs rehab services for at least 5 days per week and skilled nursing services daily to facilitate this transition. Rehab is being requested as the most appropriate d/c option for this patient and is NOT felt to be for custodial care as evidenced by previously independent with ADLs including not limited to some cooking, grooming, feeding, toileting and other basic ADLs  prior to admission    She has baseline left hemiparesis from CVAs, unable to use left upper extremity functionally, with OT yesterday had significant pain in left wrist, required max to total assist for mobility tasks, able to complete basic feeding and grooming tasks with right hand with set up, maximum to total assist for all other ADLs, husband unable to provide necessary level of physical assistance and does not have any other family or friends to assist. Based on this I feel she is  truly unsafe to discharge home.   Physical therapist recommends SNF rehab due to fall risk, unsafe ambulating, -  Dispo: The patient is from: Home  Anticipated d/c is to: SNF  Anticipated d/c date is: 2 days  Patient currently is medically stable to d/c. Barriers: --  --  physical therapist recommends SNF rehab due to fall risk, unsafe ambulating,  Code Status : full  Family Communication: Discussed with husband at bedside*  Consults  :  -Discussed with orthopedic surgeon  DVT Prophylaxis  :   - Heparin - SCDs *   Lab Results  Component Value Date   PLT 260 12/29/2019    Inpatient Medications  Scheduled Meds: . amLODipine  5 mg Oral Daily  . atorvastatin  80 mg Oral q1800  . calcium-vitamin D  1 tablet Oral Daily  . clopidogrel  75 mg Oral Daily  . insulin aspart  0-5 Units Subcutaneous QHS  . insulin aspart  0-9 Units Subcutaneous TID WC  . insulin glargine  32 Units Subcutaneous Daily  . levothyroxine  100 mcg Oral q morning - 10a  . methocarbamol  500 mg Oral TID  . polyethylene glycol  17 g Oral Daily  . senna-docusate  2 tablet Oral BID  . vitamin B-12  1,000 mcg Oral Daily   Continuous Infusions: PRN Meds:.acetaminophen, HYDROmorphone (DILAUDID) injection, oxyCODONE    Anti-infectives (From admission, onward)   None        Objective:   Vitals:   12/30/19 0001 12/30/19 0403 12/30/19 2113 12/31/19 0521  BP: (!) 129/56 132/86 (!) 138/53 (!) 138/44  Pulse: 88 95 90 86  Resp: 16 16 16 18   Temp: 98.6 F (37 C) 100.2 F (37.9 C) 98.8 F (37.1 C) 98.6 F (37 C)  TempSrc: Oral Oral Oral   SpO2: 97% 96% 99% 94%  Weight:      Height:        Wt Readings from Last 3 Encounters:  12/29/19 62.6 kg  11/22/19 66.8 kg  11/10/19 66.6 kg     Intake/Output Summary (Last 24 hours) at 12/31/2019 1705 Last data filed at 12/31/2019 0500 Gross per 24 hour  Intake --  Output 551 ml  Net -551 ml     Physical Exam  Physical Examination: General appearance - alert,  in no acute distress mental status - alert, oriented to person, place, and time,  Eyes - sclera anicteric Neck - supple, no JVD elevation , Chest - clear  to auscultation bilaterally, symmetrical air movement,  Heart - S1 and S2 normal, regular  Abdomen - soft,  nontender, nondistended,  Neurological - screening mental status exam normal, neck supple without rigidity, cranial nerves II through XII intact,  -Left upper extremity weakness and contracture which is not new -Residual left lower extremity weakness which is not new Extremities - no pedal edema noted, intact peripheral pulses  Skin - warm, dry MSK-left wrist deformity/angulation consistent with fracture   Data Review:   Micro Results Recent Results (from the past 240 hour(s))  SARS Coronavirus 2 by RT PCR (hospital order, performed in [place] hospital lab) Nasopharyngeal Nasopharyngeal Swab     Status: None   Collection Time: 12/29/19  3:20 AM   Specimen: Nasopharyngeal Swab  Result Value Ref Range Status   SARS Coronavirus 2 NEGATIVE NEGATIVE Final    Comment: (NOTE) SARS-CoV-2 target nucleic acids are NOT DETECTED.  The SARS-CoV-2 RNA is generally detectable in upper and lower respiratory specimens during the acute phase of  infection. The lowest concentration of SARS-CoV-2 viral copies this assay can detect is 250 copies / mL. A negative result does not preclude SARS-CoV-2 infection and should not be used as the sole basis for treatment or other patient management decisions.  A negative result may occur with improper specimen collection / handling, submission of specimen other than nasopharyngeal swab, presence of viral mutation(s) within the areas targeted by this assay, and inadequate number of viral copies (<250 copies / mL). A negative result must be combined with clinical observations, patient history, and epidemiological information.  Fact Sheet for Patients:   StrictlyIdeas.no  Fact Sheet for Healthcare Providers: BankingDealers.co.za  This test is not yet approved or  cleared by the [place] FDA and has been authorized for detection and/or diagnosis of SARS-CoV-2 by FDA under an Emergency Use Authorization  (EUA).  This EUA will remain in effect (meaning this test can be used) for the duration of the COVID-19 declaration under Section 564(b)(1) of the Act, 21 U.S.C. section 360bbb-3(b)(1), unless the authorization is terminated or revoked sooner.  Performed at [facility], [address], [place], [place] [address]   Urine Culture     Status: None (Preliminary result)   Collection Time: 12/29/19  4:19 AM   Specimen: Urine, Clean Catch  Result Value Ref Range Status   Specimen Description   Final    URINE, CLEAN CATCH Performed at [facility], [address] [place], [place] [address]    Special Requests   Final    Normal Performed at [facility], [address] [place], [place] [address]    Culture   Final    CULTURE REINCUBATED FOR BETTER GROWTH Performed at [facility], [place] [address], [place], [place] [address]    Report Status PENDING  Incomplete    Radiology Reports DG Wrist Complete Left  Result Date: 12/31/2019 CLINICAL DATA:  Recent fall with wrist pain, initial encounter EXAM: LEFT WRIST - COMPLETE 3+ VIEW COMPARISON:  None. FINDINGS: There is a transverse fracture in the distal radial metaphysis with impaction and posterior angulation at the fracture site. No ulnar fracture is seen. Generalized osteopenia is noted. IMPRESSION: Distal radial fracture as described. Electronically Signed   By: Inez Catalina M.D.   On: 12/31/2019 11:23   DG Hip Unilat W or Wo Pelvis 2-3 Views Left  Result Date: 12/29/2019 CLINICAL DATA:  Fall with left-sided hip pain EXAM: DG HIP (WITH OR WITHOUT PELVIS) 2-3V LEFT COMPARISON:  10/14/2016 FINDINGS: Both femoral heads project in joint. On the view of the pelvis, the left femoral neck is obscured by the trochanter but appears more normal on the dedicated view of the left hip. There is an acute displaced fracture involving the left inferior pubic ramus with additional acute mildly displaced left superior pubic ramus fracture with fracture  extending into the left pubic tubercle. IMPRESSION: Acute displaced fractures involving the left superior and inferior pubic rami with extension of fracture lucency to the left pubic tubercle. Electronically Signed   By: Donavan Foil M.D.   On: 12/29/2019 03:01     CBC Recent Labs  Lab 12/29/19 0747  WBC 13.0*  HGB 12.2  HCT 38.2  PLT 260  MCV 87.8  MCH 28.0  MCHC 31.9  RDW 16.5*    Chemistries  Recent Labs  Lab 12/29/19 0747  NA 136  K 3.9  CL 105  CO2 18*  GLUCOSE 252*  BUN 22  CREATININE 0.82  CALCIUM 9.1  AST  40  ALT 34  ALKPHOS 78  BILITOT 1.1   ------------------------------------------------------------------------------------------------------------------ No results for input(s): CHOL, HDL, LDLCALC, TRIG, CHOLHDL, LDLDIRECT in the last 72 hours.  Lab Results  Component Value Date   HGBA1C 10.3 (H) 12/23/2018   ------------------------------------------------------------------------------------------------------------------ No results for input(s): TSH, T4TOTAL, T3FREE, THYROIDAB in the last 72 hours.  Invalid input(s): FREET3 ------------------------------------------------------------------------------------------------------------------ No results for input(s): VITAMINB12, FOLATE, FERRITIN, TIBC, IRON, RETICCTPCT in the last 72 hours.  Coagulation profile No results for input(s): INR, PROTIME in the last 168 hours.  No results for input(s): DDIMER in the last 72 hours.  Cardiac Enzymes No results for input(s): CKMB, TROPONINI, MYOGLOBIN in the last 168 hours.  Invalid input(s): CK ------------------------------------------------------------------------------------------------------------------ No results found for: BNP   Roxan Hockey M.D on 12/31/2019 at 5:05 PM  Go to [email or web address] - for contact info  [facility] - Office  [phone]

## 2020-01-01 DIAGNOSIS — W19XXXA Unspecified fall, initial encounter: Secondary | ICD-10-CM | POA: Diagnosis not present

## 2020-01-01 DIAGNOSIS — S52502A Unspecified fracture of the lower end of left radius, initial encounter for closed fracture: Secondary | ICD-10-CM

## 2020-01-01 DIAGNOSIS — S32592A Other specified fracture of left pubis, initial encounter for closed fracture: Secondary | ICD-10-CM | POA: Diagnosis not present

## 2020-01-01 LAB — CBC
HCT: 35.8 % — ABNORMAL LOW (ref 36.0–46.0)
Hemoglobin: 11.6 g/dL — ABNORMAL LOW (ref 12.0–15.0)
MCH: 28 pg (ref 26.0–34.0)
MCHC: 32.4 g/dL (ref 30.0–36.0)
MCV: 86.5 fL (ref 80.0–100.0)
Platelets: 223 10*3/uL (ref 150–400)
RBC: 4.14 MIL/uL (ref 3.87–5.11)
RDW: 16.7 % — ABNORMAL HIGH (ref 11.5–15.5)
WBC: 9.4 10*3/uL (ref 4.0–10.5)
nRBC: 0 % (ref 0.0–0.2)

## 2020-01-01 LAB — BASIC METABOLIC PANEL
Anion gap: 10 (ref 5–15)
BUN: 20 mg/dL (ref 8–23)
CO2: 22 mmol/L (ref 22–32)
Calcium: 8.7 mg/dL — ABNORMAL LOW (ref 8.9–10.3)
Chloride: 103 mmol/L (ref 98–111)
Creatinine, Ser: 0.9 mg/dL (ref 0.44–1.00)
GFR calc Af Amer: >60 mL/min (ref >60)
GFR calc non Af Amer: 58 mL/min — ABNORMAL LOW (ref >60)
Glucose, Bld: 175 mg/dL — ABNORMAL HIGH (ref 70–99)
Potassium: 3.6 mmol/L (ref 3.5–5.1)
Sodium: 135 mmol/L (ref 135–145)

## 2020-01-01 LAB — GLUCOSE, CAPILLARY
Glucose-Capillary: 165 mg/dL — ABNORMAL HIGH (ref 70–99)
Glucose-Capillary: 221 mg/dL — ABNORMAL HIGH (ref 70–99)
Glucose-Capillary: 107 mg/dL — ABNORMAL HIGH (ref 70–99)
Glucose-Capillary: 110 mg/dL — ABNORMAL HIGH (ref 70–99)

## 2020-01-01 LAB — SARS CORONAVIRUS 2 BY RT PCR (HOSPITAL ORDER, PERFORMED IN ~~LOC~~ HOSPITAL LAB): SARS Coronavirus 2: NEGATIVE

## 2020-01-01 MED ORDER — HEPARIN SODIUM (PORCINE) 5000 UNIT/ML IJ SOLN: 5000.0000 [IU] | Freq: Three times a day (TID) | INTRAMUSCULAR | Status: DC

## 2020-01-01 NOTE — Consult Note (Addendum)
[facility]  Patient ID: Danielle [name], female   DOB: [birth year], 86 y.o.   MRN: [id number] Consult in hospital [facility] Requested by Dr. Joslyn Hy Reason for consultation fracture left wrist and left hemipelvis  I recommend nonoperative treatment based on the history and physical below  Chief Complaint  Patient presents with  . Fall    HPI Danielle [name] is a 86 y.o. female.  Presented to the ER after falling backwards when she slipped turning into the bathroom.  She had had a stroke on the left side and had weakness of the left lower extremity was able to ambulate with a cane her left upper extremity was completely paralyzed.  She complains of pain in her left hip moderate to severe pain in her left wrist mild to moderate pain was present on initial presentation pain was located over the left wrist and left hemipelvis/groin hip pain was described as dull sharp at times depending on movement   Review of Systems (at least 2) ROS  1.  Localized weakness from the stroke 2.  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    Past Surgical History:  Procedure Laterality Date  . AUGMENTATION MAMMAPLASTY Bilateral   . COSMETIC SURGERY    . DILATION AND CURETTAGE OF UTERUS     . EYE SURGERY    . FRACTURE SURGERY    . JOINT REPLACEMENT    . REVERSE SHOULDER ARTHROPLASTY Right 05/26/2017  . REVERSE SHOULDER ARTHROPLASTY Right 05/26/2017   Procedure: REVERSE RIGHT SHOULDER ARTHROPLASTY;  Surgeon: Meredith Pel, MD;  Location: [facility];  Service: Orthopedics;  Laterality: Right;  . TONSILLECTOMY    . TUBAL LIGATION       Family History  Problem Relation Age of Onset  . Cancer Mother   . Heart disease Father     Social History   Tobacco Use  . Smoking status: Former Smoker    Packs/day: 1.00    Years: 40.00    Pack years: 40.00    Types: Cigarettes    Quit date: 1991    Years since quitting: 30.6  . Smokeless tobacco: Never Used  Vaping Use  . Vaping Use: Never used  Substance Use Topics  . Alcohol use: No  . Drug use: No    Allergies  Allergen Reactions  . Fosamax [Alendronate Sodium] Other (See Comments)    MUSCLE ACHES  . Prednisone     Feels jittery and nauseous     Current Facility-Administered Medications:  .  acetaminophen (TYLENOL) tablet 650 mg, 650 mg, Oral, Q6H PRN, Emokpae, Courage, MD, 650 mg at 12/29/19 1310 .  amLODipine (NORVASC) tablet 5 mg, 5 mg, Oral, Daily, Emokpae, Courage, MD, 5 mg at 01/01/20 1001 .  atorvastatin (LIPITOR) tablet 80 mg, 80 mg, Oral, q1800, Emokpae, Courage, MD, 80 mg at 12/31/19 1719 .  calcium-vitamin D (OSCAL WITH D) 500-200 MG-UNIT per tablet 1 tablet, 1 tablet, Oral, Daily, Emokpae, Courage, MD, 1 tablet at 01/01/20 1001 .  clopidogrel (PLAVIX) tablet 75 mg, 75 mg, Oral, Daily, Emokpae, Courage, MD, 75 mg at 01/01/20 1001 .  HYDROmorphone (DILAUDID) injection 0.5 mg, 0.5 mg, Intravenous, Q2H PRN, Emokpae, Courage, MD, 0.5 mg at 12/31/19 0933 .  insulin aspart (novoLOG) injection 0-5 Units, 0-5 Units, Subcutaneous, QHS, Emokpae, Courage, MD, 2 Units at 12/30/19 2223 .  insulin aspart (novoLOG) injection 0-9 Units, 0-9 Units, Subcutaneous, TID WC, Emokpae, Courage, MD, 2 Units at 01/01/20 1000 .   insulin glargine (LANTUS) injection 32 Units, 32 Units, Subcutaneous, Daily, Emokpae, Courage, MD, 32 Units at 01/01/20 1001 .  levothyroxine (SYNTHROID) tablet 100 mcg, 100 mcg, Oral, q morning - 10a, Emokpae, Courage, MD, 100 mcg at 01/01/20 9604 .  methocarbamol (ROBAXIN) tablet 500 mg, 500 mg, Oral, TID, Emokpae, Courage, MD, 500 mg at 01/01/20 1001 .  oxyCODONE (Oxy IR/ROXICODONE) immediate release tablet 5 mg, 5 mg, Oral, Q4H PRN, Emokpae, Courage, MD, 5 mg at 01/01/20 1001 .  polyethylene glycol (MIRALAX / GLYCOLAX) packet 17 g, 17 g, Oral, Daily, Emokpae, Courage, MD, 17 g at 01/01/20 1001 .  senna-docusate (Senokot-S) tablet 2 tablet, 2 tablet, Oral, BID, Emokpae, Courage, MD, 2 tablet at 01/01/20 1001 .  vitamin B-12 (CYANOCOBALAMIN) tablet 1,000 mcg, 1,000 mcg, Oral, Daily, Emokpae, Courage, MD, 1,000 mcg at 01/01/20 1001     Physical Exam-Detailed Physical Exam  BP (!) 125/49 (BP Location: Right Arm)   Pulse 84   Temp 98.4 F (36.9 C)   Resp 16   Ht 5\' 5"  (1.651 m)   Wt 62.6 kg   SpO2 96%   BMI 22.96 kg/m  Body mass index is 22.96 kg/m.  Gen. appearance normal frame, no cachexia very somnolent and groggy Cardiovascular exam the pulses are 2+ with no peripheral edema  The ambulatory status is nonambulatory at present  Extremity examined left upper  No gross deformity tenderness over the fracture site mild swelling decreased range of motion because of the global weakness the characteristic internal rotation of the shoulder flexing of the wrist and elbow all joints are stable no atrophy is noted  I Sensation to touch is normal The patient is oriented to person place and time The patient's mood and affect show no depression or anxiety or agitation  Other extremity :   Pelvis is stable tenderness on the left hemipelvis weakness left lower extremity normal strength right lower extremity No tremors are noted in either lower extremity  skin there are no scars rashes  lesions or lacerations   MEDICAL DECISION MAKING   Data Reviewed  I have personally reviewed the imaging studies and the report and my interpretation is:  Pelvis and AP lateral left hip show a nondisplaced or minimally displaced pubic rami fracture on the left and then a displaced shortened slightly dorsally angulated distal radius fracture on the left  Assessment and Plan  As the patient has no use of the left upper extremity simple immobilization over surgery is warranted.  Patient and her husband made aware  As for the pelvis standard treatment of weightbearing as tolerated however, patient will have significant difficulties rehabbing without use of her left upper extremity there are modifications that can be made to the walker but this is going to be a difficult slow process  Do recommend brace for the wrist as it will not be used for weightbearing  Follow-up plan  Wrist and pelvis x-ray in 4 weeks  Patient is known to our practice and would be categorized as a established patient  last office visit was December 2018   Level 4 established patient/level 3 consult with 57 modifier for fracture care of the pelvis and wrist  Arther Abbott 01/01/2020, 10:54 AM   Carole Civil MD

## 2020-01-01 NOTE — Progress Notes (Signed)
Patient Demographics:    Danielle [name], is a 86 y.o. female, DOB - [birth year], DZH:[id number]  Admit date - 12/29/2019   Admitting Physician Angelize Ryce Denton Brick, MD  Outpatient Primary MD for the patient is Perlie Mayo, NP  LOS - 1   Chief Complaint  Patient presents with  . Fall        Subjective:    [name] today has no fevers, no emesis,  No chest pain,    -Left wrist and pelvic area pain persist -patient has left-sided pubic rami fracture as well as left radius fracture making rehab at home impossible due to non-weightbearing status of left upper extremity while dealing with an acute left pubic rami fracture- -  Assessment  & Plan :    Principal Problem:   Closed fracture of multiple pubic rami, left, initial encounter [facility]) Active Problems:   Fall at home, initial encounter   Uncontrolled type 2 diabetes mellitus with diabetic nephropathy, with long-term current use of insulin ([place])   Hypertension   History of CVA with residual deficit   Other fracture of upper end of left radius, initial encounter for closed fracture--transverse fracture in the distal radial metaphysis with     1)Closed Fracture of Left Pubic Rami--- Imaging studies reviewed with orthopedic surgeon Dr. Arther Abbott ---Recommends nonoperative, conservative treatment -Follow-up as outpatient with Dr. Arther Abbott in 1 month for recheck and reevaluation -Physical therapist recommends SNF rehab -IV and oral opiates as needed muscle relaxants as prescribed -Continue calcium vitamin D - orthopedic consult appreciated recommends rehab and repeat pelvis x-ray in 4 weeks  2)S/p Fall--- with pubic rami and left radius fractures -- fall precautions SNF rehab recommended by physical therapist   3)History of prior stroke with residual left-sided hemiparesis--- stroke was August 2020 ----Continue  Lipitor and Plavix for secondary Stroke Prophylaxis  4)DM2--previously uncontrolled, no recent A1c available, -Use Novolog/Humalog Sliding scale insulin with Accu-Cheks/Fingersticks as ordered  -Continue Lantus 30 units nightly  5)Hypothyroidism--- stable, continue levothyroxine 100 mcg daily  6)HTN-stable, continue amlodipine 5 mg daily  7)Constipation---  improved with laxatives as ordered  8) status post fall with left distal radius fracture----left wrist x-ray shows -transverse fracture in the distal radial metaphysis with impaction and posterior angulation at the fracture site ----Orthopedic consult appreciated, recommends use of brace for the left upper extremity, nonweightbearing status for left upper extremity, rehab and repeat wrist  x-ray in 4 weeks --Pain control as ordered   -Disposition:- PT/OT evaluations performed. SNF Rehab recommended. SNF appropriate as the patient has received 3 days of hospital care and is felt to need rehab services to restore this patient to their prior level of function to achieve safe transition back to home care. This patient needs rehab services for at least 5 days per week and skilled nursing services daily to facilitate this transition. Rehab is being requested as the most appropriate d/c option for this patient and is NOT felt to be for custodial care as evidenced by previously independent with ADLs including not limited to some cooking, grooming, feeding, toileting and other basic ADLs  prior to admission    She has baseline left hemiparesis from CVAs, unable to use left upper extremity functionally, with OT yesterday had significant pain  in left wrist, required max to total assist for mobility tasks, able to complete basic feeding and grooming tasks with right hand with set up, maximum to total assist for all other ADLs, husband unable to provide necessary level of physical assistance and does not have any other family or friends to assist.  Based on this I feel she is truly unsafe to discharge home.   Physical therapist recommends SNF rehab due to fall risk, unsafe ambulating----patient has left-sided pubic rami fracture as well as left radius fracture making rehab at home impossible due to non-weightbearing status of left upper extremity while dealing with an acute left pubic rami fracture-  Dispo: The patient is from: Home  Anticipated d/c is to: SNF  Anticipated d/c date is: 2 days  Patient currently is medically stable to d/c. Barriers: -- --  physical therapist recommends SNF rehab due to fall risk, unsafe ambulating,  Code Status : full  Family Communication: Discussed with husband at bedside*  Consults  :  -Discussed with orthopedic surgeon  DVT Prophylaxis  :   - Heparin - SCDs *   Lab Results  Component Value Date   PLT 223 01/01/2020    Inpatient Medications  Scheduled Meds: . amLODipine  5 mg Oral Daily  . atorvastatin  80 mg Oral q1800  . calcium-vitamin D  1 tablet Oral Daily  . clopidogrel  75 mg Oral Daily  . insulin aspart  0-5 Units Subcutaneous QHS  . insulin aspart  0-9 Units Subcutaneous TID WC  . insulin glargine  32 Units Subcutaneous Daily  . levothyroxine  100 mcg Oral q morning - 10a  . methocarbamol  500 mg Oral TID  . polyethylene glycol  17 g Oral Daily  . senna-docusate  2 tablet Oral BID  . vitamin B-12  1,000 mcg Oral Daily   Continuous Infusions: PRN Meds:.acetaminophen, HYDROmorphone (DILAUDID) injection, oxyCODONE    Anti-infectives (From admission, onward)   None        Objective:   Vitals:   12/31/19 0521 12/31/19 2013 01/01/20 0426 01/01/20 1349  BP: (!) 138/44 (!) 146/54 (!) 125/49 (!) 111/44  Pulse: 86 90 84 72  Resp: 18 16 16 18   Temp: 98.6 F (37 C) 98.2 F (36.8 C) 98.4 F (36.9 C) 98.8 F (37.1 C)  TempSrc:      SpO2: 94% 95% 96% 96%  Weight:      Height:        Wt Readings from Last 3 Encounters:   12/29/19 62.6 kg  11/22/19 66.8 kg  11/10/19 66.6 kg     Intake/Output Summary (Last 24 hours) at 01/01/2020 1504 Last data filed at 01/01/2020 0743 Gross per 24 hour  Intake --  Output 550 ml  Net -550 ml     Physical Exam  Physical Examination: General appearance - alert,  in no acute distress mental status - alert, oriented to person, place, and time,  Eyes - sclera anicteric Neck - supple, no JVD elevation , Chest - clear  to auscultation bilaterally, symmetrical air movement,  Heart - S1 and S2 normal, regular  Abdomen - soft, nontender, nondistended,  Neurological - screening mental status exam normal, neck supple without rigidity, cranial nerves II through XII intact,  -Left upper extremity weakness and contracture which is not new -Residual left lower extremity weakness which is not new Extremities - no pedal edema noted, intact peripheral pulses  Skin - warm, dry MSK-left wrist deformity/angulation consistent with fracture   Data  Review:   Micro Results Recent Results (from the past 240 hour(s))  SARS Coronavirus 2 by RT PCR (hospital order, performed in [facility] hospital lab) Nasopharyngeal Nasopharyngeal Swab     Status: None   Collection Time: 12/29/19  3:20 AM   Specimen: Nasopharyngeal Swab  Result Value Ref Range Status   SARS Coronavirus 2 NEGATIVE NEGATIVE Final    Comment: (NOTE) SARS-CoV-2 target nucleic acids are NOT DETECTED.  The SARS-CoV-2 RNA is generally detectable in upper and lower respiratory specimens during the acute phase of infection. The lowest concentration of SARS-CoV-2 viral copies this assay can detect is 250 copies / mL. A negative result does not preclude SARS-CoV-2 infection and should not be used as the sole basis for treatment or other patient management decisions.  A negative result may occur with improper specimen collection / handling, submission of specimen other than nasopharyngeal swab, presence of viral mutation(s)  within the areas targeted by this assay, and inadequate number of viral copies (<250 copies / mL). A negative result must be combined with clinical observations, patient history, and epidemiological information.  Fact Sheet for Patients:   StrictlyIdeas.no  Fact Sheet for Healthcare Providers: BankingDealers.co.za  This test is not yet approved or  cleared by the [place] FDA and has been authorized for detection and/or diagnosis of SARS-CoV-2 by FDA under an Emergency Use Authorization (EUA).  This EUA will remain in effect (meaning this test can be used) for the duration of the COVID-19 declaration under Section 564(b)(1) of the Act, 21 U.S.C. section 360bbb-3(b)(1), unless the authorization is terminated or revoked sooner.  Performed at [facility], [address], [place], [place] [address]   Urine Culture     Status: Abnormal (Preliminary result)   Collection Time: 12/29/19  4:19 AM   Specimen: Urine, Clean Catch  Result Value Ref Range Status   Specimen Description   Final    URINE, CLEAN CATCH Performed at [facility], [address] [place], [place] [address]    Special Requests   Final    Normal Performed at [facility], [address], [place],  [address]    Culture >=100,000 COLONIES/mL GRAM NEGATIVE RODS (A)  Final   Report Status PENDING  Incomplete    Radiology Reports DG Wrist Complete Left  Result Date: 12/31/2019 CLINICAL DATA:  Recent fall with wrist pain, initial encounter EXAM: LEFT WRIST - COMPLETE 3+ VIEW COMPARISON:  None. FINDINGS: There is a transverse fracture in the distal radial metaphysis with impaction and posterior angulation at the fracture site. No ulnar fracture is seen. Generalized osteopenia is noted. IMPRESSION: Distal radial fracture as described. Electronically Signed   By: Inez Catalina M.D.   On: 12/31/2019 11:23   DG Hip Unilat W or Wo Pelvis 2-3 Views Left  Result Date:  12/29/2019 CLINICAL DATA:  Fall with left-sided hip pain EXAM: DG HIP (WITH OR WITHOUT PELVIS) 2-3V LEFT COMPARISON:  10/14/2016 FINDINGS: Both femoral heads project in joint. On the view of the pelvis, the left femoral neck is obscured by the trochanter but appears more normal on the dedicated view of the left hip. There is an acute displaced fracture involving the left inferior pubic ramus with additional acute mildly displaced left superior pubic ramus fracture with fracture extending into the left pubic tubercle. IMPRESSION: Acute displaced fractures involving the left superior and inferior pubic rami with extension of fracture lucency to the left pubic tubercle. Electronically Signed   By: Madie Reno.D.  On: 12/29/2019 03:01     CBC Recent Labs  Lab 12/29/19 0747 01/01/20 0736  WBC 13.0* 9.4  HGB 12.2 11.6*  HCT 38.2 35.8*  PLT 260 223  MCV 87.8 86.5  MCH 28.0 28.0  MCHC 31.9 32.4  RDW 16.5* 16.7*    Chemistries  Recent Labs  Lab 12/29/19 0747 01/01/20 0736  NA 136 135  K 3.9 3.6  CL 105 103  CO2 18* 22  GLUCOSE 252* 175*  BUN 22 20  CREATININE 0.82 0.90  CALCIUM 9.1 8.7*  AST 40  --   ALT 34  --   ALKPHOS 78  --   BILITOT 1.1  --    ------------------------------------------------------------------------------------------------------------------ No results for input(s): CHOL, HDL, LDLCALC, TRIG, CHOLHDL, LDLDIRECT in the last 72 hours.  Lab Results  Component Value Date   HGBA1C 10.3 (H) 12/23/2018   ------------------------------------------------------------------------------------------------------------------ No results for input(s): TSH, T4TOTAL, T3FREE, THYROIDAB in the last 72 hours.  Invalid input(s): FREET3 ------------------------------------------------------------------------------------------------------------------ No results for input(s): VITAMINB12, FOLATE, FERRITIN, TIBC, IRON, RETICCTPCT in the last 72 hours.  Coagulation profile No  results for input(s): INR, PROTIME in the last 168 hours.  No results for input(s): DDIMER in the last 72 hours.  Cardiac Enzymes No results for input(s): CKMB, TROPONINI, MYOGLOBIN in the last 168 hours.  Invalid input(s): CK ------------------------------------------------------------------------------------------------------------------ No results found for: BNP   -patient has left-sided pubic rami fracture as well as left radius fracture making rehab at home impossible due to non-weightbearing status of left upper extremity while dealing with an acute left pubic rami fracture-   Roxan Hockey M.D on 01/01/2020 at 3:04 PM  Go to [email or web address] - for contact info  [facility] - Office  [phone]

## 2020-01-01 NOTE — Consult Note (Signed)
[facility]  Patient ID: Danielle [name], female   DOB: Nov 12, 1931, 86 y.o.   MRN: [id number] Consult in hospital [facility] Requested by Dr. Joslyn Hy Reason for consultation fracture left wrist and left hemipelvis  I recommend nonoperative treatment based on the history and physical below  Chief Complaint  Patient presents with   Fall    HPI Danielle [name] is a 86 y.o. female.  Presented to the ER after falling backwards when she slipped turning into the bathroom.  She had had a stroke on the left side and had weakness of the left lower extremity was able to ambulate with a cane her left upper extremity was completely paralyzed.  She complains of pain in her left hip moderate to severe pain in her left wrist mild to moderate pain was present on initial presentation pain was located over the left wrist and left hemipelvis/groin hip pain was described as dull sharp at times depending on movement   Review of Systems (at least 2) ROS  1.  Localized weakness from the stroke 2.  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    Past Surgical History:  Procedure Laterality Date   AUGMENTATION MAMMAPLASTY Bilateral    COSMETIC SURGERY     DILATION AND CURETTAGE OF UTERUS      EYE SURGERY     FRACTURE SURGERY     JOINT REPLACEMENT     REVERSE SHOULDER ARTHROPLASTY Right 05/26/2017   REVERSE SHOULDER ARTHROPLASTY Right 05/26/2017   Procedure: REVERSE RIGHT SHOULDER ARTHROPLASTY;  Surgeon: Meredith Pel, MD;  Location: [facility];  Service: Orthopedics;  Laterality: Right;   TONSILLECTOMY     TUBAL LIGATION       Family History  Problem Relation Age of Onset   Cancer Mother    Heart disease Father     Social History   Tobacco Use   Smoking status: Former Smoker    Packs/day: 1.00    Years: 40.00    Pack years: 40.00    Types: Cigarettes    Quit date: 1991    Years since quitting: 30.6   Smokeless tobacco: Never Used  Scientific laboratory technician Use: Never used  Substance Use Topics   Alcohol use: No   Drug use: No    Allergies  Allergen Reactions   Fosamax [Alendronate Sodium] Other (See Comments)    MUSCLE ACHES   Prednisone     Feels jittery and nauseous     Current Facility-Administered Medications:    acetaminophen (TYLENOL) tablet 650 mg, 650 mg, Oral, Q6H PRN, Emokpae, Courage, MD, 650 mg at 12/29/19 1310   amLODipine (NORVASC) tablet 5 mg, 5 mg, Oral, Daily, Emokpae, Courage, MD, 5 mg at 01/01/20 1001   atorvastatin (LIPITOR) tablet 80 mg, 80 mg, Oral, q1800, Emokpae, Courage, MD, 80 mg at 12/31/19 1719   calcium-vitamin D (OSCAL WITH D) 500-200 MG-UNIT per tablet 1 tablet, 1 tablet, Oral, Daily, Emokpae, Courage, MD, 1 tablet at 01/01/20 1001  clopidogrel (PLAVIX) tablet 75 mg, 75 mg, Oral, Daily, Emokpae, Courage, MD, 75 mg at 01/01/20 1001   HYDROmorphone (DILAUDID) injection 0.5 mg, 0.5 mg, Intravenous, Q2H PRN, Denton Brick, Courage, MD, 0.5 mg at 12/31/19 0933   insulin aspart (novoLOG) injection 0-5 Units, 0-5 Units, Subcutaneous, QHS, Emokpae, Courage, MD, 2 Units at 12/30/19 2223   insulin aspart (novoLOG) injection 0-9 Units, 0-9 Units, Subcutaneous, TID WC, Emokpae, Courage, MD, 2 Units at 01/01/20 1000    insulin glargine (LANTUS) injection 32 Units, 32 Units, Subcutaneous, Daily, Emokpae, Courage, MD, 32 Units at 01/01/20 1001   levothyroxine (SYNTHROID) tablet 100 mcg, 100 mcg, Oral, q morning - 10a, Emokpae, Courage, MD, 100 mcg at 01/01/20 5631   methocarbamol (ROBAXIN) tablet 500 mg, 500 mg, Oral, TID, Emokpae, Courage, MD, 500 mg at 01/01/20 1001   oxyCODONE (Oxy IR/ROXICODONE) immediate release tablet 5 mg, 5 mg, Oral, Q4H PRN, Denton Brick, Courage, MD, 5 mg at 01/01/20 1001   polyethylene glycol (MIRALAX / GLYCOLAX) packet 17 g, 17 g, Oral, Daily, Emokpae, Courage, MD, 17 g at 01/01/20 1001   senna-docusate (Senokot-S) tablet 2 tablet, 2 tablet, Oral, BID, Emokpae, Courage, MD, 2 tablet at 01/01/20 1001   vitamin B-12 (CYANOCOBALAMIN) tablet 1,000 mcg, 1,000 mcg, Oral, Daily, Emokpae, Courage, MD, 1,000 mcg at 01/01/20 1001     Physical Exam-Detailed Physical Exam  BP (!) 125/49 (BP Location: Right Arm)    Pulse 84    Temp 98.4 F (36.9 C)    Resp 16    Ht 5\' 5"  (1.651 m)    Wt 62.6 kg    SpO2 96%    BMI 22.96 kg/m  Body mass index is 22.96 kg/m.  Gen. appearance normal frame, no cachexia very somnolent and groggy Cardiovascular exam the pulses are 2+ with no peripheral edema  The ambulatory status is nonambulatory at present  Extremity examined left upper  No gross deformity tenderness over the fracture site mild swelling decreased range of motion because of the global weakness the characteristic internal rotation of the shoulder flexing of the wrist and elbow all joints are stable no atrophy is noted  I Sensation to touch is normal The patient is oriented to person place and time The patient's mood and affect show no depression or anxiety or agitation  Other extremity :   Pelvis is stable tenderness on the left hemipelvis weakness left lower extremity normal strength right lower extremity No tremors are noted in either lower extremity  skin there are no scars rashes  lesions or lacerations   MEDICAL DECISION MAKING   Data Reviewed  I have personally reviewed the imaging studies and the report and my interpretation is:  Pelvis and AP lateral left hip show a nondisplaced or minimally displaced pubic rami fracture on the left and then a displaced shortened slightly dorsally angulated distal radius fracture on the left  Assessment and Plan  As the patient has no use of the left upper extremity simple immobilization over surgery is warranted.  Patient and her husband made aware  As for the pelvis standard treatment of weightbearing as tolerated however, patient will have significant difficulties rehabbing without use of her left upper extremity there are modifications that can be made to the walker but this is going to be a difficult slow process  Do recommend brace for the wrist as it will not be used for weightbearing  Follow-up plan  Wrist and pelvis x-ray in 4 weeks  Level 3 consult with 57 modifier for  fracture care of the pelvis and wrist  Arther Abbott 01/01/2020, 10:49 AM   Carole Civil MD

## 2020-01-02 DIAGNOSIS — S32592A Other specified fracture of left pubis, initial encounter for closed fracture: Secondary | ICD-10-CM | POA: Diagnosis not present

## 2020-01-02 LAB — BASIC METABOLIC PANEL
Anion gap: 10 (ref 5–15)
BUN: 23 mg/dL (ref 8–23)
CO2: 23 mmol/L (ref 22–32)
Calcium: 8.7 mg/dL — ABNORMAL LOW (ref 8.9–10.3)
Chloride: 103 mmol/L (ref 98–111)
Creatinine, Ser: 1.02 mg/dL — ABNORMAL HIGH (ref 0.44–1.00)
GFR calc Af Amer: 58 mL/min — ABNORMAL LOW (ref >60)
GFR calc non Af Amer: 50 mL/min — ABNORMAL LOW (ref >60)
Glucose, Bld: 185 mg/dL — ABNORMAL HIGH (ref 70–99)
Potassium: 3.8 mmol/L (ref 3.5–5.1)
Sodium: 136 mmol/L (ref 135–145)

## 2020-01-02 LAB — CBC
HCT: 35.8 % — ABNORMAL LOW (ref 36.0–46.0)
Hemoglobin: 11.7 g/dL — ABNORMAL LOW (ref 12.0–15.0)
MCH: 28.5 pg (ref 26.0–34.0)
MCHC: 32.7 g/dL (ref 30.0–36.0)
MCV: 87.3 fL (ref 80.0–100.0)
Platelets: 222 10*3/uL (ref 150–400)
RBC: 4.1 MIL/uL (ref 3.87–5.11)
RDW: 17.1 % — ABNORMAL HIGH (ref 11.5–15.5)
WBC: 9.3 10*3/uL (ref 4.0–10.5)
nRBC: 0 % (ref 0.0–0.2)

## 2020-01-02 LAB — URINE CULTURE
Culture: >=100000 — AB
Special Requests: NORMAL

## 2020-01-02 LAB — GLUCOSE, CAPILLARY
Glucose-Capillary: 156 mg/dL — ABNORMAL HIGH (ref 70–99)
Glucose-Capillary: 181 mg/dL — ABNORMAL HIGH (ref 70–99)

## 2020-01-02 MED ORDER — TOUJEO MAX SOLOSTAR 300 UNIT/ML ~~LOC~~ SOPN: 32.0000 [IU] | PEN_INJECTOR | Freq: Every day | SUBCUTANEOUS | 2 refills | Status: DC

## 2020-01-02 MED ORDER — METHOCARBAMOL 500 MG PO TABS: 500.0000 mg | ORAL_TABLET | Freq: Three times a day (TID) | ORAL | 2 refills | Status: DC

## 2020-01-02 MED ORDER — HUMALOG KWIKPEN 200 UNIT/ML ~~LOC~~ SOPN: 0.0000 [IU] | PEN_INJECTOR | Freq: Three times a day (TID) | SUBCUTANEOUS | 1 refills | Status: DC

## 2020-01-02 MED ORDER — OXYCODONE HCL 5 MG PO TABS: 5.0000 mg | ORAL_TABLET | ORAL | 0 refills | Status: DC | PRN

## 2020-01-02 MED ORDER — POLYETHYLENE GLYCOL 3350 17 G PO PACK: 17.0000 g | PACK | Freq: Every day | ORAL | 0 refills | Status: DC

## 2020-01-02 MED ORDER — SENNOSIDES-DOCUSATE SODIUM 8.6-50 MG PO TABS: 2.0000 | ORAL_TABLET | Freq: Two times a day (BID) | ORAL | 2 refills | Status: DC

## 2020-01-02 NOTE — Discharge Instructions (Signed)
1) follow-up with Dr. Arther Abbott the orthopedic surgeon for repeat left wrist and pelvic x-rays in 4 weeks 2) no weightbearing on left upper extremity 3) okay to ambulate and weight-bear on lower extremities as tolerated 4)insulin aspart (novoLOG) injection 0-10 Units  0-10 Units Subcutaneous, 3 times daily with meals  CBG < 70: Implement Hypoglycemia Standing Orders and refer to Hypoglycemia Standing Orders sidebar report   CBG 70 - 120: 0 unit CBG 121 - 150: 0 unit  CBG 151 - 200: 1 unit  CBG 201 - 250: 2 units  CBG 251 - 300: 4 units  CBG 301 - 350: 6 units   CBG 351 - 400: 8 units  CBG > 400: 10 units

## 2020-01-02 NOTE — Progress Notes (Signed)
Patient to be transferred to [facility] for Rehab. Husband made aware. Report called and given to Marko Stai RN. All questions were answered and no further questions at this time. Patient in stable condition and in no acute distress at time of discharge. Patient will be transported via [facility].

## 2020-01-02 NOTE — Discharge Summary (Signed)
Danielle [name], is a 86 y.o. female  DOB 04/19/1932  MRN [id number].  Admission date:  12/29/2019  Admitting Physician  Nekia Maxham Denton Brick, MD  Discharge Date:  01/02/2020   Primary MD  Perlie Mayo, NP  Recommendations for primary care physician for things to follow:   1)Follow-up with Dr. Arther Abbott the orthopedic surgeon for repeat left wrist and pelvic x-rays in 4 weeks 2) No weightbearing on left upper extremity 3) okay to ambulate and weight-bear on lower extremities as tolerated 4)insulin aspart (novoLOG) injection 0-10 Units  0-10 Units Subcutaneous, 3 times daily with meals  CBG < 70: Implement Hypoglycemia Standing Orders and refer to Hypoglycemia Standing Orders sidebar report   CBG 70 - 120: 0 unit CBG 121 - 150: 0 unit  CBG 151 - 200: 1 unit  CBG 201 - 250: 2 units  CBG 251 - 300: 4 units  CBG 301 - 350: 6 units   CBG 351 - 400: 8 units  CBG > 400: 10 units  Admission Diagnosis  Bilateral fracture of pubic rami ([place]) [S32.591A, S32.592A] Fall at home, initial encounter [W19.XXXA, Y92.009] Closed bilateral fracture of pubic rami, initial encounter ([place]) [S32.591A, S32.592A] Closed fracture of left pelvis, initial encounter ([place]) [S32.9XXA] Closed fracture of multiple pubic rami, left, initial encounter ([place]) [S32.592A] Laceration of left little finger, initial encounter [S61.217A]   Discharge Diagnosis  Bilateral fracture of pubic rami ([place]) [S32.591A, S32.592A] Fall at home, initial encounter [W19.XXXA, Y92.009] Closed bilateral fracture of pubic rami, initial encounter ([place]) [S32.591A, S32.592A] Closed fracture of left pelvis, initial encounter ([place]) [S32.9XXA] Closed fracture of multiple pubic rami, left, initial encounter ([place]) [S32.592A] Laceration of left little finger, initial encounter [S61.217A]    Principal Problem:   Closed fracture of multiple pubic rami,  left, initial encounter [facility]) Active Problems:   Fall at home, initial encounter   Uncontrolled type 2 diabetes mellitus with diabetic nephropathy, with long-term current use of insulin ([place])   Hypertension   History of CVA with residual deficit   Other fracture of upper end of left radius, initial encounter for closed fracture--transverse fracture in the distal radial metaphysis with      Past Medical History:  Diagnosis Date  . Arthritis    "some joint pain once in awhile" (05/26/2017)  . Back pain at L4-L5 level 06/14/2013  . Cataract   . CVA (cerebral vascular accident) ([place]) 2019  . Encounter for screening mammogram for malignant neoplasm of breast 04/19/2019  . Generalized weakness 06/09/2018  . Heart murmur   . History of kidney stones   . Hyperlipemia   . Hypertension   . Hypothyroidism (acquired)   . Muscle weakness (generalized) 12/17/2012  . Osteoporosis   . Pain in joint, shoulder region 12/17/2012  . Pneumothorax 02/05/2017   right-after fall  . Proximal humerus fracture 11/29/2012  . Rib fractures 02/05/2017   right side  . 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 Shoulder fracture 12/14/2012  . Stroke ([place])   .  Stroke-like symptoms 12/23/2018  . Type II diabetes mellitus ([place])     Past Surgical History:  Procedure Laterality Date  . AUGMENTATION MAMMAPLASTY Bilateral   . COSMETIC SURGERY    . DILATION AND CURETTAGE OF UTERUS    . EYE SURGERY    . FRACTURE SURGERY    . JOINT REPLACEMENT    . REVERSE SHOULDER ARTHROPLASTY Right 05/26/2017  . REVERSE SHOULDER ARTHROPLASTY Right 05/26/2017   Procedure: REVERSE RIGHT SHOULDER ARTHROPLASTY;  Surgeon: Meredith Pel, MD;  Location: [place];  Service: Orthopedics;  Laterality: Right;  . TONSILLECTOMY    . TUBAL LIGATION       HPI  from the history and physical done on the day of admission:    Danielle [name]  is a 86 y.o. female with past medical history relevant for prior stroke in August 2020 with  residual right-sided hemiparesis, DM, HTN who presents to the ED post fall with complaint of Hip Pain -Apparently lost her balance while ambulating at home using a cane she fell backwards complains of laceration to left pinky finger, denies headache or head injury --- Pelvic X-rays consistent with left-sided pubic rami fractures-- Imaging studies reviewed with orthopedic surgeon Dr. Arther Abbott ---Recommends nonoperative, conservative treatment -Follow-up as outpatient with Dr. Arther Abbott in 1 month for recheck and reevaluation - -Patient with increasing pain requiring IV opiates for pain control,, physical therapist recommends SNF rehab due to fall risk, unsafe ambulating, - No fever  Or chills  No nausea, vomiting, diarrhea -Additional history obtained from husband at bedside -Glucose is 252, anion gap is 13 -WBC is 13, query stress related UA pending -Husband at bedside, additional history obtained from husband     Hospital Course:    -1)ClosedFracture ofLeftPubicRami---Imaging studies reviewed with orthopedic surgeon Dr. Arther Abbott ---Recommends nonoperative, conservative treatment -Follow-up as outpatient with Dr. Arther Abbott in 1 month for recheck and reevaluation -Physical therapist recommends SNF rehab  oral opiates as needed muscle relaxants as prescribed -Continue calcium vitamin D - orthopedic consult appreciated recommends rehab and repeat pelvis x-ray in 4 weeks  2)S/p Fall--- with pubic rami and left radius fractures -- fall precautions SNF rehab recommended by physical therapist --Left wrist and pelvic area pain is Not worse -patient has left-sided pubic rami fracture as well as left radius fracture making rehab at home impossible due to non-weightbearing status of left upper extremity while dealing with an acute left pubic rami fracture-   3)History of prior stroke with residual left-sided hemiparesis---stroke was August  2020 ----Continue Lipitor and Plavix for secondaryStrokeProphylaxis  4)DM2--previously uncontrolled,   A1c 7.6 in April 2021, -Use Novolog/Humalog Sliding scale insulin with Accu-Cheks/Fingersticks as ordered -Increase  Lantus to 32 units nightly  5)Hypothyroidism---stable,continue levothyroxine 100 mcg daily  6)HTN-stable, continue amlodipine 5 mg daily  7)Constipation---continue stool softeners/laxatives especially while on narcotics  8) status post fall with left distal radius fracture----left wrist x-ray shows -transverse fracture in the distal radial metaphysis with impaction and posterior angulation at the fracture site ----Orthopedic consult appreciated, recommends use of brace for the left upper extremity, nonweightbearing status for left upper extremity, rehab and repeat wrist  x-ray in 4 weeks --Pain control as ordered --Left wrist and pelvic area pain persist -patient has left-sided pubic rami fracture as well as left radius fracture making rehab at home impossible due to non-weightbearing status of left upper extremity while dealing with an acute left pubic rami fracture-   -Disposition:- PT/OT evaluations performed. SNF Rehab recommended. SNF appropriate  as the patient has received 3 days of hospital care and is felt to need rehab services to restore this patient to their prior level of function to achieve safe transition back to home care. This patient needs rehab services for at least 5 days per week and skilled nursing services daily to facilitate this transition. Rehab is being requested as the most appropriate d/c option for this patient and is NOT felt to be for custodial care as evidenced by previously independent with ADLs including not limited to some cooking, grooming, feeding, toileting and other basic ADLs  prior to admission    She has baseline left hemiparesis from CVAs, unable to use left upper extremity functionally, with OT yesterday had significant  pain in left wrist, required max to total assist for mobility tasks, able to complete basic feeding and grooming tasks with right hand with set up, maximum to total assist for all other ADLs, husband unable to provide necessary level of physical assistance and does not have any other family or friends to assist. Based on this I feel she is truly unsafe to discharge home.   Physical therapist recommends SNF rehab due to fall risk, unsafe ambulating----patient has left-sided pubic rami fracture as well as left radius fracture making rehab at home impossible due to non-weightbearing status of left upper extremity while dealing with an acute left pubic rami fracture-  Dispo: The patient is from:Home Anticipated d/c is to:SNF  Code Status : full  Family Communication: Discussed with husband at bedside*  Consults  :  -Discussed with orthopedic surgeon   Discharge Condition: stable  Follow UP   Contact information for after-discharge care    Destination    [facility] .   Service: Skilled Nursing Contact information: [address] [place] [phone]                  Diet and Activity recommendation:  As advised  Discharge Instructions    Discharge Instructions    Ambulatory referral to Physical Therapy   Complete by: As directed    Call MD for:  difficulty breathing, headache or visual disturbances   Complete by: As directed    Call MD for:  extreme fatigue   Complete by: As directed    Call MD for:  persistant dizziness or light-headedness   Complete by: As directed    Call MD for:  persistant nausea and vomiting   Complete by: As directed    Call MD for:  severe uncontrolled pain   Complete by: As directed    Call MD for:  temperature >100.4   Complete by: As directed    Diet - low sodium heart healthy   Complete by: As directed    Diet Carb  Modified   Complete by: As directed    Discharge instructions   Complete by: As directed    1) follow-up with Dr. Arther Abbott the orthopedic surgeon for repeat left wrist and pelvic x-rays in 4 weeks 2) no weightbearing on left upper extremity 3) okay to ambulate and weight-bear on lower extremities as tolerated 4)insulin aspart (novoLOG) injection 0-10 Units  0-10 Units Subcutaneous, 3 times daily with meals  CBG < 70: Implement Hypoglycemia Standing Orders and refer to Hypoglycemia Standing Orders sidebar report   CBG 70 - 120: 0 unit CBG 121 - 150: 0 unit  CBG 151 - 200: 1 unit  CBG 201 - 250: 2 units  CBG 251 - 300: 4 units  CBG 301 - 350: 6 units   CBG 351 - 400: 8 units  CBG > 400: 10 units   Increase activity slowly   Complete by: As directed    No wound care   Complete by: As directed         Discharge Medications     Allergies as of 01/02/2020      Reactions   Fosamax [alendronate Sodium] Other (See Comments)   MUSCLE ACHES   Prednisone    Feels jittery and nauseous      Medication List    TAKE these medications   acetaminophen 325 MG tablet Commonly known as: TYLENOL Take 2 tablets (650 mg total) by mouth every 6 (six) hours as needed for mild pain (or Fever >/= 101).   amLODipine 5 MG tablet Commonly known as: NORVASC Take 1 tablet (5 mg total) by mouth daily.   atorvastatin 80 MG tablet Commonly known as: LIPITOR TAKE 1 TABLET (80 MG TOTAL) BY MOUTH DAILY AT 6 PM.   CALCIUM 600 + D PO Take 1 tablet by mouth daily.   clopidogrel 75 MG tablet Commonly known as: PLAVIX TAKE 1 TABLET BY MOUTH EVERY DAY   diclofenac sodium 1 % Gel Commonly known as: VOLTAREN Apply 2 g topically 4 (four) times daily.   HumaLOG KwikPen 200 UNIT/ML KwikPen Generic drug: insulin lispro Inject 12 Units into the skin in the morning, at noon, and at bedtime. 10-12 units BID What changed: Another medication with the same name was added. Make sure you understand how  and when to take each.   HumaLOG KwikPen 200 UNIT/ML KwikPen Generic drug: insulin lispro Inject 0-10 Units into the skin 4 (four) times daily -  before meals and at bedtime. insulin aspart (novoLOG) injection 0-10 Units  0-10 Units Subcutaneous, 3 times daily with meals  CBG < 70: Implement Hypoglycemia Standing Orders and refer to Hypoglycemia Standing Orders sidebar report   CBG 70 - 120: 0 unit CBG 121 - 150: 0 unit  CBG 151 - 200: 1 unit  CBG 201 - 250: 2 units  CBG 251 - 300: 4 units  CBG 301 - 350: 6 units   CBG 351 - 400: 8 units  CBG > 400: 10 units What changed: You were already taking a medication with the same name, and this prescription was added. Make sure you understand how and when to take each.   methocarbamol 500 MG tablet Commonly known as: ROBAXIN Take 1 tablet (500 mg total) by mouth 3 (three) times daily.   OCUVITE ADULT 50+ PO Take 1 tablet by mouth daily.   oxyCODONE 5 MG immediate release tablet Commonly known as: Oxy IR/ROXICODONE Take 1 tablet (5 mg total) by mouth every 4 (four) hours as needed for moderate pain or severe pain.   polyethylene glycol 17 g packet Commonly known as: MIRALAX / GLYCOLAX Take 17 g by mouth daily. Start taking on: January 03, 2020   senna-docusate 8.6-50 MG tablet Commonly known as: Senokot-S Take 2 tablets by mouth 2 (two) times daily. What changed: when to take this   Synthroid 100 MCG tablet Generic drug: levothyroxine Take 1 tablet (100 mcg total) by mouth every morning.   Toujeo Max SoloStar 300 UNIT/ML Solostar Pen Generic drug: insulin glargine (2 Unit Dial) Inject 32 Units into the skin daily before breakfast. What changed: how much to take   vitamin B-12 1000 MCG tablet Commonly known as: CYANOCOBALAMIN Take  1 tablet (1,000 mcg total) by mouth daily.       Major procedures and Radiology Reports - PLEASE review detailed and final reports for all details, in brief -   DG Wrist Complete Left  Result  Date: 12/31/2019 CLINICAL DATA:  Recent fall with wrist pain, initial encounter EXAM: LEFT WRIST - COMPLETE 3+ VIEW COMPARISON:  None. FINDINGS: There is a transverse fracture in the distal radial metaphysis with impaction and posterior angulation at the fracture site. No ulnar fracture is seen. Generalized osteopenia is noted. IMPRESSION: Distal radial fracture as described. Electronically Signed   By: Inez Catalina M.D.   On: 12/31/2019 11:23   DG Hip Unilat W or Wo Pelvis 2-3 Views Left  Result Date: 12/29/2019 CLINICAL DATA:  Fall with left-sided hip pain EXAM: DG HIP (WITH OR WITHOUT PELVIS) 2-3V LEFT COMPARISON:  10/14/2016 FINDINGS: Both femoral heads project in joint. On the view of the pelvis, the left femoral neck is obscured by the trochanter but appears more normal on the dedicated view of the left hip. There is an acute displaced fracture involving the left inferior pubic ramus with additional acute mildly displaced left superior pubic ramus fracture with fracture extending into the left pubic tubercle. IMPRESSION: Acute displaced fractures involving the left superior and inferior pubic rami with extension of fracture lucency to the left pubic tubercle. Electronically Signed   By: Donavan Foil M.D.   On: 12/29/2019 03:01    Micro Results   Recent Results (from the past 240 hour(s))  SARS Coronavirus 2 by RT PCR (hospital order, performed in [facility] hospital lab) Nasopharyngeal Nasopharyngeal Swab     Status: None   Collection Time: 12/29/19  3:20 AM   Specimen: Nasopharyngeal Swab  Result Value Ref Range Status   SARS Coronavirus 2 NEGATIVE NEGATIVE Final    Comment: (NOTE) SARS-CoV-2 target nucleic acids are NOT DETECTED.  The SARS-CoV-2 RNA is generally detectable in upper and lower respiratory specimens during the acute phase of infection. The lowest concentration of SARS-CoV-2 viral copies this assay can detect is 250 copies / mL. A negative result does not preclude  SARS-CoV-2 infection and should not be used as the sole basis for treatment or other patient management decisions.  A negative result may occur with improper specimen collection / handling, submission of specimen other than nasopharyngeal swab, presence of viral mutation(s) within the areas targeted by this assay, and inadequate number of viral copies (<250 copies / mL). A negative result must be combined with clinical observations, patient history, and epidemiological information.  Fact Sheet for Patients:   StrictlyIdeas.no  Fact Sheet for Healthcare Providers: BankingDealers.co.za  This test is not yet approved or  cleared by the [place] FDA and has been authorized for detection and/or diagnosis of SARS-CoV-2 by FDA under an Emergency Use Authorization (EUA).  This EUA will remain in effect (meaning this test can be used) for the duration of the COVID-19 declaration under Section 564(b)(1) of the Act, 21 U.S.C. section 360bbb-3(b)(1), unless the authorization is terminated or revoked sooner.  Performed at [facility], [address], [place], [place] [address]   Urine Culture     Status: Abnormal   Collection Time: 12/29/19  4:19 AM   Specimen: Urine, Clean Catch  Result Value Ref Range Status   Specimen Description   Final    URINE, CLEAN CATCH Performed at [facility], [address], [place], [place] [address]    Special Requests   Final  Normal Performed at [facility], [address], [place], [place] [address]    Culture >=100,000 COLONIES/mL ESCHERICHIA COLI (A)  Final   Report Status 01/02/2020 FINAL  Final   Organism ID, Bacteria ESCHERICHIA COLI (A)  Final      Susceptibility   Escherichia coli - MIC*    AMPICILLIN <=2 SENSITIVE Sensitive     CEFAZOLIN <=4 SENSITIVE Sensitive     CEFTRIAXONE <=0.25 SENSITIVE Sensitive     CIPROFLOXACIN <=0.25 SENSITIVE Sensitive     GENTAMICIN <=1 SENSITIVE Sensitive      IMIPENEM <=0.25 SENSITIVE Sensitive     NITROFURANTOIN <=16 SENSITIVE Sensitive     TRIMETH/SULFA <=20 SENSITIVE Sensitive     AMPICILLIN/SULBACTAM <=2 SENSITIVE Sensitive     PIP/TAZO <=4 SENSITIVE Sensitive     * >=100,000 COLONIES/mL ESCHERICHIA COLI  SARS Coronavirus 2 by RT PCR (hospital order, performed in [facility] hospital lab) Nasopharyngeal Nasopharyngeal Swab     Status: None   Collection Time: 01/01/20  2:00 PM   Specimen: Nasopharyngeal Swab  Result Value Ref Range Status   SARS Coronavirus 2 NEGATIVE NEGATIVE Final    Comment: (NOTE) SARS-CoV-2 target nucleic acids are NOT DETECTED.  The SARS-CoV-2 RNA is generally detectable in upper and lower respiratory specimens during the acute phase of infection. The lowest concentration of SARS-CoV-2 viral copies this assay can detect is 250 copies / mL. A negative result does not preclude SARS-CoV-2 infection and should not be used as the sole basis for treatment or other patient management decisions.  A negative result may occur with improper specimen collection / handling, submission of specimen other than nasopharyngeal swab, presence of viral mutation(s) within the areas targeted by this assay, and inadequate number of viral copies (<250 copies / mL). A negative result must be combined with clinical observations, patient history, and epidemiological information.  Fact Sheet for Patients:   StrictlyIdeas.no  Fact Sheet for Healthcare Providers: BankingDealers.co.za  This test is not yet approved or  cleared by the [place] FDA and has been authorized for detection and/or diagnosis of SARS-CoV-2 by FDA under an Emergency Use Authorization (EUA).  This EUA will remain in effect (meaning this test can be used) for the duration of the COVID-19 declaration under Section 564(b)(1) of the Act, 21 U.S.C. section 360bbb-3(b)(1), unless the authorization is terminated or revoked  sooner.  Performed at [facility], [address] [place], [place] [address]        Today   Subjective    Danielle [name] today has no New complaints  -Left wrist and pelvic area pain is not worse -patient has left-sided pubic rami fracture as well as left radius fracture making rehab at home impossible due to non-weightbearing status of left upper extremity while dealing with an acute left pubic rami fracture-   Patient has been seen and examined prior to discharge   Objective   Blood pressure 135/66, pulse 90, temperature 97.7 F (36.5 C), temperature source Oral, resp. rate 14, height 5\' 5"  (1.651 m), weight 62.6 kg, SpO2 92 %.   Intake/Output Summary (Last 24 hours) at 01/02/2020 1326 Last data filed at 01/02/2020 0900 Gross per 24 hour  Intake 340 ml  Output 300 ml  Net 40 ml    Exam Physical Examination: General appearance - alert, in no acute distress mental status - alert, oriented to person, place, and time,  Eyes - sclera anicteric Neck - supple, no JVD elevation , Chest - clear to auscultation bilaterally, symmetrical  air movement,  Heart - S1 and S2 normal, regular  Abdomen - soft, nontender, nondistended,  Neurological - screening mental status exam normal, neck supple without rigidity, cranial nerves II through XII intact, -Left upper extremity weakness and contracture which is not new -Residual left lower extremity weakness which is not new Extremities - no pedal edema noted, intact peripheral pulses  Skin - warm, dry MSK-left wrist deformity/angulation consistent with fracture   Data Review   CBC w Diff:  Lab Results  Component Value Date   WBC 9.3 01/02/2020   HGB 11.7 (L) 01/02/2020   HCT 35.8 (L) 01/02/2020   PLT 222 01/02/2020   LYMPHOPCT 24 01/04/2019   MONOPCT 10 01/04/2019   EOSPCT 5 01/04/2019   BASOPCT 1 01/04/2019    CMP:  Lab Results  Component Value Date   NA 136 01/02/2020   K 3.8 01/02/2020   CL 103 01/02/2020    CO2 23 01/02/2020   BUN 23 01/02/2020   CREATININE 1.02 (H) 01/02/2020   PROT 6.4 (L) 12/29/2019   ALBUMIN 3.5 12/29/2019   BILITOT 1.1 12/29/2019   ALKPHOS 78 12/29/2019   AST 40 12/29/2019   ALT 34 12/29/2019  . -patient has left-sided pubic rami fracture as well as left radius fracture making rehab at home impossible due to non-weightbearing status of left upper extremity while dealing with an acute left pubic rami fracture-  Total Discharge time is about 33 minutes  Roxan Hockey M.D on 01/02/2020 at 1:26 PM  Go to [email or web address] -  for contact info  [facility] - Office  [phone]

## 2020-01-02 NOTE — TOC Transition Note (Signed)
Transition of Care [facility]) - CM/SW Discharge Note   Patient Details  Name: Danielle [name] MRN: [id number] Date of Birth: 11-11-1931  Transition of Care [facility]) CM/SW Contact:  Salome Arnt, LCSW Phone Number: 01/02/2020, 1:24 PM   Clinical Narrative:   Pt d/c today to [facility] SNF. Pt's husband, [name] and facility aware. [name] requests transport via [employer]. COVID test negative 8/15. Authorization received [id number], next review date 8/18). LCSW will send d/c summary when completed.     Final next level of care: Skilled Nursing Facility Barriers to Discharge: Barriers Resolved   Patient Goals and CMS Choice Patient states their goals for this hospitalization and ongoing recovery are:: Return home CMS Medicare.gov Compare Post Acute Care list provided to:: Patient Choice offered to / list presented to : Patient  Discharge Placement              Patient chooses bed at: Other - please specify in the comment section below: ([facility]) Patient to be transferred to facility by: [facility] Name of family member notified: [name]- husband Patient and family notified of of transfer: 01/02/20  Discharge Plan and Services In-house Referral: Clinical Social Work Discharge Planning Services: NA Post Acute Care Choice: [place]          DME Arranged: [place] rolling DME Agency: [employer]       HH Arranged: NA HH Agency: NA Date [place]: 12/29/19      Social Determinants of Health (SDOH) Interventions     Readmission Risk Interventions No flowsheet data found.

## 2020-01-02 NOTE — Care Management Important Message (Signed)
Important Message  Patient Details  Name: Danielle [name] MRN: [id number] Date of Birth: 1932-03-25   Medicare Important Message Given:  Yes     Tommy Medal 01/02/2020, 3:08 PM

## 2020-01-06 ENCOUNTER — Encounter: Payer: Medicare Other | Admitting: Physical Medicine & Rehabilitation

## 2020-01-09 ENCOUNTER — Ambulatory Visit (HOSPITAL_COMMUNITY): Payer: Medicare Other

## 2020-01-11 ENCOUNTER — Telehealth: Payer: Self-pay

## 2020-01-11 NOTE — Telephone Encounter (Signed)
Left message requesting a call back to see if we could schedule a hospital f/u appt or if patient had been discharged to SNF

## 2020-02-06 ENCOUNTER — Encounter (INDEPENDENT_AMBULATORY_CARE_PROVIDER_SITE_OTHER): Payer: Medicare Other | Admitting: Ophthalmology

## 2020-02-10 ENCOUNTER — Ambulatory Visit: Payer: Medicare Other | Admitting: Family Medicine

## 2020-02-15 ENCOUNTER — Other Ambulatory Visit: Payer: Self-pay

## 2020-02-15 ENCOUNTER — Encounter: Payer: Self-pay | Admitting: Orthopedic Surgery

## 2020-02-15 ENCOUNTER — Ambulatory Visit: Payer: Medicare Other

## 2020-02-15 ENCOUNTER — Ambulatory Visit (INDEPENDENT_AMBULATORY_CARE_PROVIDER_SITE_OTHER): Payer: Medicare Other | Admitting: Orthopedic Surgery

## 2020-02-15 ENCOUNTER — Ambulatory Visit (INDEPENDENT_AMBULATORY_CARE_PROVIDER_SITE_OTHER): Payer: Medicare Other | Admitting: Internal Medicine

## 2020-02-15 ENCOUNTER — Encounter: Payer: Self-pay | Admitting: Internal Medicine

## 2020-02-15 VITALS — BP 134/67 | HR 87 | Temp 97.4°F | Resp 18 | Ht 65.0 in | Wt 146.0 lb

## 2020-02-15 VITALS — BP 116/50 | HR 83 | Wt 141.8 lb

## 2020-02-15 DIAGNOSIS — S62102A Fracture of unspecified carpal bone, left wrist, initial encounter for closed fracture: Secondary | ICD-10-CM

## 2020-02-15 DIAGNOSIS — E1165 Type 2 diabetes mellitus with hyperglycemia: Secondary | ICD-10-CM | POA: Diagnosis not present

## 2020-02-15 DIAGNOSIS — I6930 Unspecified sequelae of cerebral infarction: Secondary | ICD-10-CM | POA: Diagnosis not present

## 2020-02-15 DIAGNOSIS — E039 Hypothyroidism, unspecified: Secondary | ICD-10-CM

## 2020-02-15 DIAGNOSIS — S62102D Fracture of unspecified carpal bone, left wrist, subsequent encounter for fracture with routine healing: Secondary | ICD-10-CM | POA: Diagnosis not present

## 2020-02-15 DIAGNOSIS — IMO0002 Reserved for concepts with insufficient information to code with codable children: Secondary | ICD-10-CM

## 2020-02-15 DIAGNOSIS — S32502A Unspecified fracture of left pubis, initial encounter for closed fracture: Secondary | ICD-10-CM

## 2020-02-15 DIAGNOSIS — S32502D Unspecified fracture of left pubis, subsequent encounter for fracture with routine healing: Secondary | ICD-10-CM

## 2020-02-15 DIAGNOSIS — S32592A Other specified fracture of left pubis, initial encounter for closed fracture: Secondary | ICD-10-CM | POA: Diagnosis not present

## 2020-02-15 DIAGNOSIS — Z794 Long term (current) use of insulin: Secondary | ICD-10-CM

## 2020-02-15 DIAGNOSIS — I10 Essential (primary) hypertension: Secondary | ICD-10-CM

## 2020-02-15 DIAGNOSIS — Z09 Encounter for follow-up examination after completed treatment for conditions other than malignant neoplasm: Secondary | ICD-10-CM | POA: Insufficient documentation

## 2020-02-15 DIAGNOSIS — M7989 Other specified soft tissue disorders: Secondary | ICD-10-CM

## 2020-02-15 DIAGNOSIS — E1121 Type 2 diabetes mellitus with diabetic nephropathy: Secondary | ICD-10-CM

## 2020-02-15 LAB — POCT GLYCOSYLATED HEMOGLOBIN (HGB A1C)
HbA1c POC (<> result, manual entry): 8.4 % (ref 4.0–5.6)
HbA1c, POC (controlled diabetic range): 8.4 % — AB (ref 0.0–7.0)
HbA1c, POC (prediabetic range): 8.4 % — AB (ref 5.7–6.4)
Hemoglobin A1C: 8.4 % — AB (ref 4.0–5.6)

## 2020-02-15 NOTE — Assessment & Plan Note (Signed)
Recently discharged from Rehab after fracture of pubic rami and left radial fracture Medication reconciliation done, problem list updated

## 2020-02-15 NOTE — Progress Notes (Signed)
Established Patient Office Visit  Subjective:  Patient ID: Danielle [name], female    DOB: [birth year]  Age: 86 y.o. MRN: [id number]  CC:  Chief Complaint  Patient presents with  . Edema    swelling in both legs x2 weeks they hurt pt was just released from rehab for broken pelvis bone can't wear ted hose because they hurt    HPI Danielle [name] is an 86 year old female with past medical history of uncontrolled type II DM, hypertension, CVA with residual left UE numbness, hypothyroidism and osteoporosis, presents after being discharged from Rehab facility 5 days ago.  Patient had a fall at home on 08/12.  She was found to have left pubic rami fracture and left radial fracture.  No operative intervention was indicated in the hospital after orthopedic evaluation.  Patient was discharged to rehab facility for physical therapy.  She was given a wrist immobilizer for radial fracture.  Currently, patient states that her pelvic pain and wrist pain have improved significantly.  She visited her orthopedic surgeon this morning and had x-rays showed proper healing of both pubic rami and radial fracture.  Patient still takes oxycodone and Tylenol as needed for pain.  She complains of noticing bilateral lower extremity edema, but denies any numbness, tingling or focal weakness.  She states that she has been walking with the help of a cane.  She is to get further outpatient physical therapy.  She checks her blood glucose at home, mentions that they have been between 80-200, variable.  She is currently on Toujeo, long and short acting insulin.  She follows with an endocrinologist.  Her HbA1c in office today is 8.4.  She denies any dizziness, chest pain, palpitations or excessive sweating.  Her medications have been reconciled in today's visit.  Past Medical History:  Diagnosis Date  . Arthritis    "some joint pain once in awhile" (05/26/2017)  . Back pain at L4-L5 level 06/14/2013  . Cataract   . CVA  (cerebral vascular accident) ([place]) 2019  . Encounter for screening mammogram for malignant neoplasm of breast 04/19/2019  . Generalized weakness 06/09/2018  . Heart murmur   . History of kidney stones   . Hyperlipemia   . Hypertension   . Hypothyroidism (acquired)   . Muscle weakness (generalized) 12/17/2012  . Osteoporosis   . Pain in joint, shoulder region 12/17/2012  . Pneumothorax 02/05/2017   right-after fall  . Proximal humerus fracture 11/29/2012  . Rib fractures 02/05/2017   right side  . 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 Shoulder fracture 12/14/2012  . Stroke ([place])   . Stroke-like symptoms 12/23/2018  . Type II diabetes mellitus ([place])     Past Surgical History:  Procedure Laterality Date  . AUGMENTATION MAMMAPLASTY Bilateral   . COSMETIC SURGERY    . DILATION AND CURETTAGE OF UTERUS    . EYE SURGERY    . FRACTURE SURGERY    . JOINT REPLACEMENT    . REVERSE SHOULDER ARTHROPLASTY Right 05/26/2017  . REVERSE SHOULDER ARTHROPLASTY Right 05/26/2017   Procedure: REVERSE RIGHT SHOULDER ARTHROPLASTY;  Surgeon: Meredith Pel, MD;  Location: [facility];  Service: Orthopedics;  Laterality: Right;  . TONSILLECTOMY    . TUBAL LIGATION      Family History  Problem Relation Age of Onset  . Cancer Mother   . Heart disease Father     Social History   Socioeconomic History  . Marital status: Married    Spouse  name: [name]   . Number of children: 2  . Years of education: Not on file  . Highest education level: Not on file  Occupational History  . Occupation: retired  Tobacco Use  . Smoking status: Former Smoker    Packs/day: 1.00    Years: 40.00    Pack years: 40.00    Types: Cigarettes    Quit date: 1991    Years since quitting: 30.7  . Smokeless tobacco: Never Used  Vaping Use  . Vaping Use: Never used  Substance and Sexual Activity  . Alcohol use: No  . Drug use: No  . Sexual activity: Yes  Other Topics Concern  . Not on file  Social History  Narrative   Lives  with husband      Catalina Antigua and [name] -children; lives close by   5 grandchildren, 2 great grandchildren      Enjoys: antiquing, shopping, beach and mountains      Diet: eats all food groups   Caffeine: coffee and coke -drinks for low sugar    Water: 4-5 cups daily       Wears seat belt, is not able to drive well   No Geophysical data processor at home    Social Determinants of Health   Financial Resource Strain: Low Risk   . Difficulty of Paying Living Expenses: Not hard at all  Food Insecurity: No Food Insecurity  . Worried About Charity fundraiser in the Last Year: Never true  . Ran Out of Food in the Last Year: Never true  Transportation Needs: No Transportation Needs  . Lack of Transportation (Medical): No  . Lack of Transportation (Non-Medical): No  Physical Activity: Inactive  . Days of Exercise per Week: 0 days  . Minutes of Exercise per Session: 0 min  Stress: No Stress Concern Present  . Feeling of Stress : Not at all  Social Connections: Moderately Isolated  . Frequency of Communication with Friends and Family: Three times a week  . Frequency of Social Gatherings with Friends and Family: Once a week  . Attends Religious Services: Never  . Active Member of Clubs or Organizations: No  . Attends Archivist Meetings: Never  . Marital Status: Married  Human resources officer Violence: Not At Risk  . Fear of Current or Ex-Partner: No  . Emotionally Abused: No  . Physically Abused: No  . Sexually Abused: No    Outpatient Medications Prior to Visit  Medication Sig Dispense Refill  . acetaminophen (TYLENOL) 325 MG tablet Take 2 tablets (650 mg total) by mouth every 6 (six) hours as needed for mild pain (or Fever >/= 101).    Marland Kitchen atorvastatin (LIPITOR) 80 MG tablet TAKE 1 TABLET (80 MG TOTAL) BY MOUTH DAILY AT 6 PM. 90 tablet 1  . Calcium Carb-Cholecalciferol (CALCIUM 600 + D PO) Take 1 tablet by mouth daily.    . clopidogrel (PLAVIX) 75 MG tablet TAKE  1 TABLET BY MOUTH EVERY DAY 90 tablet 4  . diclofenac sodium (VOLTAREN) 1 % GEL Apply 2 g topically 4 (four) times daily. 2 g 0  . insulin glargine, 2 Unit Dial, (TOUJEO MAX SOLOSTAR) 300 UNIT/ML Solostar Pen Inject 32 Units into the skin daily before breakfast. 3 mL 2  . Insulin Lispro (HUMALOG KWIKPEN) 200 UNIT/ML SOPN Inject 12 Units into the skin in the morning, at noon, and at bedtime. 10-12 units BID     . methocarbamol (ROBAXIN) 500 MG tablet Take 1 tablet (  500 mg total) by mouth 3 (three) times daily. 90 tablet 2  . Multiple Vitamins-Minerals (OCUVITE ADULT 50+ PO) Take 1 tablet by mouth daily.     Marland Kitchen oxyCODONE (OXY IR/ROXICODONE) 5 MG immediate release tablet Take 1 tablet (5 mg total) by mouth every 4 (four) hours as needed for moderate pain or severe pain. 12 tablet 0  . senna-docusate (SENOKOT-S) 8.6-50 MG tablet Take 2 tablets by mouth 2 (two) times daily. 120 tablet 2  . SYNTHROID 100 MCG tablet Take 1 tablet (100 mcg total) by mouth every morning. 30 tablet 0  . vitamin B-12 (CYANOCOBALAMIN) 1000 MCG tablet Take 1 tablet (1,000 mcg total) by mouth daily. 30 tablet 0  . amLODipine (NORVASC) 5 MG tablet Take 1 tablet (5 mg total) by mouth daily. 30 tablet 11  . insulin lispro (HUMALOG KWIKPEN) 200 UNIT/ML KwikPen Inject 0-10 Units into the skin 4 (four) times daily -  before meals and at bedtime. insulin aspart (novoLOG) injection 0-10 Units  0-10 Units Subcutaneous, 3 times daily with meals  CBG < 70: Implement Hypoglycemia Standing Orders and refer to Hypoglycemia Standing Orders sidebar report   CBG 70 - 120: 0 unit CBG 121 - 150: 0 unit  CBG 151 - 200: 1 unit  CBG 201 - 250: 2 units  CBG 251 - 300: 4 units  CBG 301 - 350: 6 units   CBG 351 - 400: 8 units  CBG > 400: 10 units (Patient not taking: Reported on 02/15/2020) 15 mL 1  . polyethylene glycol (MIRALAX / GLYCOLAX) 17 g packet Take 17 g by mouth daily. (Patient not taking: Reported on 02/15/2020) 14 each 0   No  facility-administered medications prior to visit.    Allergies  Allergen Reactions  . Fosamax [Alendronate Sodium] Other (See Comments)    MUSCLE ACHES  . Prednisone     Feels jittery and nauseous    ROS Review of Systems  Constitutional: Negative for chills and fever.  HENT: Negative for congestion, sinus pressure, sinus pain and sore throat.   Eyes: Negative for pain and discharge.  Respiratory: Negative for cough and shortness of breath.   Cardiovascular: Negative for chest pain and palpitations.  Gastrointestinal: Negative for abdominal pain, constipation, diarrhea, nausea and vomiting.  Endocrine: Negative for polydipsia and polyuria.  Genitourinary: Negative for dysuria and hematuria.  Musculoskeletal: Positive for arthralgias (Left wrist and hip) and joint swelling (Left wrist). Negative for neck pain and neck stiffness.  Skin: Negative for rash.  Neurological: Negative for dizziness and weakness.  Psychiatric/Behavioral: Negative for agitation and behavioral problems.      Objective:    Physical Exam Vitals reviewed.  Constitutional:      General: She is not in acute distress.    Appearance: She is not diaphoretic.  HENT:     Head: Normocephalic and atraumatic.     Nose: Nose normal. No congestion.     Mouth/Throat:     Mouth: Mucous membranes are moist.     Pharynx: No posterior oropharyngeal erythema.  Eyes:     General: No scleral icterus.    Extraocular Movements: Extraocular movements intact.     Pupils: Pupils are equal, round, and reactive to light.  Cardiovascular:     Rate and Rhythm: Normal rate and regular rhythm.     Heart sounds: Normal heart sounds. No murmur heard.   Pulmonary:     Breath sounds: Normal breath sounds. No wheezing or rales.  Abdominal:  Palpations: Abdomen is soft.     Tenderness: There is no abdominal tenderness.  Musculoskeletal:        General: Swelling (Left wrist) present.     Cervical back: Neck supple. No  tenderness.     Right lower leg: Edema present.     Left lower leg: Edema present.  Skin:    General: Skin is warm.     Coloration: Skin is not pale.  Neurological:     General: No focal deficit present.     Mental Status: She is alert and oriented to person, place, and time.  Psychiatric:        Mood and Affect: Mood normal.        Behavior: Behavior normal.      BP 134/67 (BP Location: Right Arm, Patient Position: Sitting, Cuff Size: Normal)   Pulse 87   Temp (!) 97.4 F (36.3 C) (Temporal)   Resp 18   Ht 5\' 5"  (1.651 m)   Wt 146 lb (66.2 kg)   SpO2 97%   BMI 24.30 kg/m  Wt Readings from Last 3 Encounters:  02/15/20 146 lb (66.2 kg)  02/15/20 141 lb 12.8 oz (64.3 kg)  12/29/19 138 lb (62.6 kg)     Health Maintenance Due  Topic Date Due  . FOOT EXAM  Never done  . OPHTHALMOLOGY EXAM  Never done  . URINE MICROALBUMIN  Never done    There are no preventive care reminders to display for this patient.  Lab Results  Component Value Date   TSH 0.122 (L) 12/29/2018   Lab Results  Component Value Date   WBC 9.3 01/02/2020   HGB 11.7 (L) 01/02/2020   HCT 35.8 (L) 01/02/2020   MCV 87.3 01/02/2020   PLT 222 01/02/2020   Lab Results  Component Value Date   NA 136 01/02/2020   K 3.8 01/02/2020   CO2 23 01/02/2020   GLUCOSE 185 (H) 01/02/2020   BUN 23 01/02/2020   CREATININE 1.02 (H) 01/02/2020   BILITOT 1.1 12/29/2019   ALKPHOS 78 12/29/2019   AST 40 12/29/2019   ALT 34 12/29/2019   PROT 6.4 (L) 12/29/2019   ALBUMIN 3.5 12/29/2019   CALCIUM 8.7 (L) 01/02/2020   ANIONGAP 10 01/02/2020   Lab Results  Component Value Date   CHOL 171 12/24/2018   Lab Results  Component Value Date   HDL 59 12/24/2018   Lab Results  Component Value Date   LDLCALC 97 12/24/2018   Lab Results  Component Value Date   TRIG 75 12/24/2018   Lab Results  Component Value Date   CHOLHDL 2.9 12/24/2018   Lab Results  Component Value Date   HGBA1C 8.4 (A) 02/15/2020    HGBA1C 8.4 02/15/2020   HGBA1C 8.4 (A) 02/15/2020   HGBA1C 8.4 (A) 02/15/2020      Assessment & Plan:   Hospital discharge follow-up Recently discharged from [facility] after fracture of pubic rami and left radial fracture Medication reconciliation done, problem list updated  Closed fracture of multiple pubic rami, left, initial encounter [facility]) S/p rehab for 6 weeks No operative intervention Had orthopedic surgery visit today, X-ray pelvis shows proper healing Patient walks with cane, encouraged to continue it Physical therapy referral provided  Leg swelling B/l swelling Recent pelvic fracture, likely prolonged immobilization High risk for DVT Will get US doppler LE to r/o DVT  Other fracture of upper end of left radius, closed fracture--transverse fracture in the distal radial metaphysis Had wrist immobilizer till  this morning, had Orthopedic surgery visit today X-ray shows healing fracture. Follow up with Orthopedic surgery as scheduled Pain medications as needed  History of CVA with residual deficit With left UE numbness On Plavix and Statin  Uncontrolled type 2 diabetes mellitus with diabetic nephropathy, with long-term current use of insulin (HCC) HbA1C: 8.4 today in office On Toujeo and Insulin Advised to follow diabetic diet On statin Diabetic eye exam: Advised to follow up with Ophthalmology for diabetic eye exam Follows up with Endocrinologist   Hypertension Well-controlled with Amlodipine 5 mg QD Continue low-salt diet  Hypothyroidism (acquired) On Levothyroxine 100 mcg QD Check TSH in next visit    US Doppler LE to r/o DVT.  Follow-up: Return if symptoms worsen or fail to improve.    Lindell Spar, MD

## 2020-02-15 NOTE — Assessment & Plan Note (Signed)
On Levothyroxine 100 mcg QD Check TSH in next visit

## 2020-02-15 NOTE — Assessment & Plan Note (Addendum)
HbA1C: 8.4 today in office On Toujeo and Insulin Advised to follow diabetic diet On statin Diabetic eye exam: Advised to follow up with Ophthalmology for diabetic eye exam Follows up with Endocrinologist

## 2020-02-15 NOTE — Assessment & Plan Note (Signed)
Well-controlled with Amlodipine 5 mg QD Continue low-salt diet

## 2020-02-15 NOTE — Assessment & Plan Note (Signed)
With left UE numbness On Plavix and Statin

## 2020-02-15 NOTE — Patient Instructions (Signed)
You are scheduled for doppler ultrasound to rule out DVT.  Please continue to ambulate with cane as tolerated. Please keep legs elevated as much as possible to improve leg swelling.  Continue Physical therapy as instructed. Follow up with Orthopedic surgeon as scheduled.  Please continue to take insulin as prescribed. Please continue to measure blood glucose at home.

## 2020-02-15 NOTE — Assessment & Plan Note (Signed)
Had wrist immobilizer till this morning, had Orthopedic surgery visit today X-ray shows healing fracture. Follow up with Orthopedic surgery as scheduled Pain medications as needed

## 2020-02-15 NOTE — Progress Notes (Signed)
Chief Complaint  Patient presents with  . Wrist Pain   Encounter Diagnoses  Name Primary?  . Closed fracture of left wrist, initial encounter Yes  . Closed displaced fracture of left pubis, initial encounter ([facility])     left wrist fracture treated with splinting secondary to post stroke contracture complains of pain at the thumb in the webspace  Patient also has a closed pelvic fracture left superior and inferior pubic ramus  Patient is ambulatory with a cane  X-rays show the pelvic fracture stable healing nicely  Wrist fracture is noted to have shortening decreased radial inclination slight dorsal tilt but acceptable treatment in this setting  Patient will follow up in a month to have the pelvis x-rayed

## 2020-02-15 NOTE — Assessment & Plan Note (Signed)
S/p rehab for 6 weeks No operative intervention Had orthopedic surgery visit today, X-ray pelvis shows proper healing Patient walks with cane, encouraged to continue it Physical therapy referral provided

## 2020-02-15 NOTE — Assessment & Plan Note (Signed)
B/l swelling Recent pelvic fracture, likely prolonged immobilization High risk for DVT Will get US doppler LE to r/o DVT

## 2020-02-16 ENCOUNTER — Other Ambulatory Visit: Payer: Self-pay | Admitting: *Deleted

## 2020-02-16 ENCOUNTER — Ambulatory Visit (INDEPENDENT_AMBULATORY_CARE_PROVIDER_SITE_OTHER): Payer: Medicare Other | Admitting: Internal Medicine

## 2020-02-16 ENCOUNTER — Ambulatory Visit (HOSPITAL_COMMUNITY)
Admission: RE | Admit: 2020-02-16 | Discharge: 2020-02-16 | Disposition: A | Discharge status: Home / Self Care | Payer: Medicare Other | Source: Ambulatory Visit | Attending: Internal Medicine | Admitting: Internal Medicine

## 2020-02-16 ENCOUNTER — Encounter: Payer: Self-pay | Admitting: Internal Medicine

## 2020-02-16 VITALS — BP 125/78 | HR 83 | Resp 16 | Ht 65.0 in | Wt 146.0 lb

## 2020-02-16 DIAGNOSIS — I6523 Occlusion and stenosis of bilateral carotid arteries: Secondary | ICD-10-CM

## 2020-02-16 DIAGNOSIS — I824Y2 Acute embolism and thrombosis of unspecified deep veins of left proximal lower extremity: Secondary | ICD-10-CM

## 2020-02-16 MED ORDER — APIXABAN 5 MG PO TABS: ORAL_TABLET | ORAL | 2 refills | Status: DC

## 2020-02-16 NOTE — Progress Notes (Signed)
Established Patient Office Visit  Subjective:  Patient ID: Danielle [name], female    DOB: April 28, 1932  Age: 86 y.o. MRN: [id number]  CC:  Chief Complaint  Patient presents with  . DVT    positive on left side    HPI Danielle [name] presents is an 86 year old female with past medical history of uncontrolled type II DM, hypertension, CVA with residual left UE numbness, hypothyroidism and osteoporosis, presents after having doppler ultrasound of LE. Patient has left popliteal, femoral, and profunda femoral veins acute DVT. Of note, patient had fall and had left pubic rami and left radial fracture. She is recently discharged from [facility] facility. Patient still has left LE edema and warmth. She denies any dyspnea, chest pain, palpitations or dizziness. Her right LE edema has improved compared to prior visit. She states that she tried to keep her legs elevated, which has helped her with the swelling. Patient denies fever, chills, headache, dizziness, nausea, vomiting. Patient denies any known h/o GI bleeding.  Past Medical History:  Diagnosis Date  . Arthritis    "some joint pain once in awhile" (05/26/2017)  . Back pain at L4-L5 level 06/14/2013  . Cataract   . CVA (cerebral vascular accident) ([place]) 2019  . Encounter for screening mammogram for malignant neoplasm of breast 04/19/2019  . Generalized weakness 06/09/2018  . Heart murmur   . History of kidney stones   . Hyperlipemia   . Hypertension   . Hypothyroidism (acquired)   . Muscle weakness (generalized) 12/17/2012  . Osteoporosis   . Pain in joint, shoulder region 12/17/2012  . Pneumothorax 02/05/2017   right-after fall  . Proximal humerus fracture 11/29/2012  . Rib fractures 02/05/2017   right side  . 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 Shoulder fracture 12/14/2012  . Stroke ([place])   . Stroke-like symptoms 12/23/2018  . Type II diabetes mellitus ([place])     Past Surgical History:  Procedure Laterality Date    . AUGMENTATION MAMMAPLASTY Bilateral   . COSMETIC SURGERY    . DILATION AND CURETTAGE OF UTERUS    . EYE SURGERY    . FRACTURE SURGERY    . JOINT REPLACEMENT    . REVERSE SHOULDER ARTHROPLASTY Right 05/26/2017  . REVERSE SHOULDER ARTHROPLASTY Right 05/26/2017   Procedure: REVERSE RIGHT SHOULDER ARTHROPLASTY;  Surgeon: Meredith Pel, MD;  Location: [facility];  Service: Orthopedics;  Laterality: Right;  . TONSILLECTOMY    . TUBAL LIGATION      Family History  Problem Relation Age of Onset  . Cancer Mother   . Heart disease Father     Social History   Socioeconomic History  . Marital status: Married    Spouse name: [name]   . Number of children: 2  . Years of education: Not on file  . Highest education level: Not on file  Occupational History  . Occupation: retired  Tobacco Use  . Smoking status: Former Smoker    Packs/day: 1.00    Years: 40.00    Pack years: 40.00    Types: Cigarettes    Quit date: 1991    Years since quitting: 30.7  . Smokeless tobacco: Never Used  Vaping Use  . Vaping Use: Never used  Substance and Sexual Activity  . Alcohol use: No  . Drug use: No  . Sexual activity: Yes  Other Topics Concern  . Not on file  Social History Narrative   Lives  with husband  Regulatory affairs officer -children; lives close by   5 grandchildren, 2 great grandchildren      Enjoys: antiquing, shopping, beach and mountains      Diet: eats all food groups   Caffeine: coffee and coke -drinks for low sugar    Water: 4-5 cups daily       Wears seat belt, is not able to drive well   No Geophysical data processor at home    Social Determinants of Health   Financial Resource Strain: Low Risk   . Difficulty of Paying Living Expenses: Not hard at all  Food Insecurity: No Food Insecurity  . Worried About Charity fundraiser in the Last Year: Never true  . Ran Out of Food in the Last Year: Never true  Transportation Needs: No Transportation Needs  . Lack of  Transportation (Medical): No  . Lack of Transportation (Non-Medical): No  Physical Activity: Inactive  . Days of Exercise per Week: 0 days  . Minutes of Exercise per Session: 0 min  Stress: No Stress Concern Present  . Feeling of Stress : Not at all  Social Connections: Moderately Isolated  . Frequency of Communication with Friends and Family: Three times a week  . Frequency of Social Gatherings with Friends and Family: Once a week  . Attends Religious Services: Never  . Active Member of Clubs or Organizations: No  . Attends Archivist Meetings: Never  . Marital Status: Married  Human resources officer Violence: Not At Risk  . Fear of Current or Ex-Partner: No  . Emotionally Abused: No  . Physically Abused: No  . Sexually Abused: No    Outpatient Medications Prior to Visit  Medication Sig Dispense Refill  . acetaminophen (TYLENOL) 325 MG tablet Take 2 tablets (650 mg total) by mouth every 6 (six) hours as needed for mild pain (or Fever >/= 101).    Marland Kitchen amLODipine (NORVASC) 5 MG tablet Take 1 tablet (5 mg total) by mouth daily. 30 tablet 11  . atorvastatin (LIPITOR) 80 MG tablet TAKE 1 TABLET (80 MG TOTAL) BY MOUTH DAILY AT 6 PM. 90 tablet 1  . Calcium Carb-Cholecalciferol (CALCIUM 600 + D PO) Take 1 tablet by mouth daily.    . clopidogrel (PLAVIX) 75 MG tablet TAKE 1 TABLET BY MOUTH EVERY DAY 90 tablet 4  . diclofenac sodium (VOLTAREN) 1 % GEL Apply 2 g topically 4 (four) times daily. 2 g 0  . insulin glargine, 2 Unit Dial, (TOUJEO MAX SOLOSTAR) 300 UNIT/ML Solostar Pen Inject 32 Units into the skin daily before breakfast. 3 mL 2  . Insulin Lispro (HUMALOG KWIKPEN) 200 UNIT/ML SOPN Inject 12 Units into the skin in the morning, at noon, and at bedtime. 10-12 units BID     . methocarbamol (ROBAXIN) 500 MG tablet Take 1 tablet (500 mg total) by mouth 3 (three) times daily. 90 tablet 2  . Multiple Vitamins-Minerals (OCUVITE ADULT 50+ PO) Take 1 tablet by mouth daily.     Marland Kitchen oxyCODONE  (OXY IR/ROXICODONE) 5 MG immediate release tablet Take 1 tablet (5 mg total) by mouth every 4 (four) hours as needed for moderate pain or severe pain. 12 tablet 0  . senna-docusate (SENOKOT-S) 8.6-50 MG tablet Take 2 tablets by mouth 2 (two) times daily. 120 tablet 2  . SYNTHROID 100 MCG tablet Take 1 tablet (100 mcg total) by mouth every morning. 30 tablet 0  . vitamin B-12 (CYANOCOBALAMIN) 1000 MCG tablet Take 1 tablet (1,000 mcg total) by  mouth daily. 30 tablet 0   No facility-administered medications prior to visit.    Allergies  Allergen Reactions  . Fosamax [Alendronate Sodium] Other (See Comments)    MUSCLE ACHES  . Prednisone     Feels jittery and nauseous    ROS Review of Systems  Constitutional: Negative for chills and fever.  HENT: Negative for congestion, sinus pressure, sinus pain and sore throat.   Eyes: Negative for pain and discharge.  Respiratory: Negative for cough and shortness of breath.   Cardiovascular: Negative for chest pain and palpitations.  Gastrointestinal: Negative for abdominal pain, constipation, diarrhea, nausea and vomiting.  Endocrine: Negative for polydipsia and polyuria.  Genitourinary: Negative for dysuria and hematuria.  Musculoskeletal: Positive for arthralgias (Left wrist and hip) and joint swelling (Left wrist). Negative for neck pain and neck stiffness.  Skin: Negative for rash.  Neurological: Negative for dizziness and weakness.  Psychiatric/Behavioral: Negative for agitation and behavioral problems.      Objective:    Physical Exam Vitals reviewed.  Constitutional:      General: She is not in acute distress.    Appearance: She is not diaphoretic.  HENT:     Head: Normocephalic and atraumatic.     Nose: Nose normal. No congestion.     Mouth/Throat:     Mouth: Mucous membranes are moist.     Pharynx: No posterior oropharyngeal erythema.  Eyes:     General: No scleral icterus.    Extraocular Movements: Extraocular movements  intact.     Pupils: Pupils are equal, round, and reactive to light.  Cardiovascular:     Rate and Rhythm: Normal rate and regular rhythm.     Pulses: Normal pulses.     Heart sounds: Normal heart sounds. No murmur heard.   Pulmonary:     Breath sounds: Normal breath sounds. No wheezing or rales.  Abdominal:     Palpations: Abdomen is soft.     Tenderness: There is no abdominal tenderness.  Musculoskeletal:        General: Swelling (Left wrist) present.     Cervical back: Neck supple. No tenderness.     Right lower leg: Edema present.     Left lower leg: Edema present.  Skin:    General: Skin is warm.     Coloration: Skin is not pale.  Neurological:     General: No focal deficit present.     Mental Status: She is alert and oriented to person, place, and time.  Psychiatric:        Mood and Affect: Mood normal.        Behavior: Behavior normal.     BP 125/78   Pulse 83   Resp 16   Ht 5\' 5"  (1.651 m)   Wt 146 lb (66.2 kg)   SpO2 98%   BMI 24.30 kg/m  Wt Readings from Last 3 Encounters:  02/16/20 146 lb (66.2 kg)  02/15/20 146 lb (66.2 kg)  02/15/20 141 lb 12.8 oz (64.3 kg)     Health Maintenance Due  Topic Date Due  . FOOT EXAM  Never done  . OPHTHALMOLOGY EXAM  Never done  . URINE MICROALBUMIN  Never done    There are no preventive care reminders to display for this patient.  Lab Results  Component Value Date   TSH 0.122 (L) 12/29/2018   Lab Results  Component Value Date   WBC 9.3 01/02/2020   HGB 11.7 (L) 01/02/2020   HCT 35.8 (L) 01/02/2020  MCV 87.3 01/02/2020   PLT 222 01/02/2020   Lab Results  Component Value Date   NA 136 01/02/2020   K 3.8 01/02/2020   CO2 23 01/02/2020   GLUCOSE 185 (H) 01/02/2020   BUN 23 01/02/2020   CREATININE 1.02 (H) 01/02/2020   BILITOT 1.1 12/29/2019   ALKPHOS 78 12/29/2019   AST 40 12/29/2019   ALT 34 12/29/2019   PROT 6.4 (L) 12/29/2019   ALBUMIN 3.5 12/29/2019   CALCIUM 8.7 (L) 01/02/2020   ANIONGAP 10  01/02/2020   Lab Results  Component Value Date   CHOL 171 12/24/2018   Lab Results  Component Value Date   HDL 59 12/24/2018   Lab Results  Component Value Date   LDLCALC 97 12/24/2018   Lab Results  Component Value Date   TRIG 75 12/24/2018   Lab Results  Component Value Date   CHOLHDL 2.9 12/24/2018   Lab Results  Component Value Date   HGBA1C 8.4 (A) 02/15/2020   HGBA1C 8.4 02/15/2020   HGBA1C 8.4 (A) 02/15/2020   HGBA1C 8.4 (A) 02/15/2020      Assessment & Plan:    Acute DVT of left LE Doppler ultrasound of LE showed the left popliteal, femoral, and profunda femoral veins. No DVT on right LE. No dyspnea, palpitations, chest pain or dizziness Will start Eliquis 10 mg BID for 7 days followed by 5 mg BID for 3 months Will check CBC after 2 weeks Advised patient to get immediate medical attention in case of dyspnea, chest pain, palpitations, dizziness, GI bleeding or AMS Discussed fall prevention strategy    Problem List Items Addressed This Visit    None    Visit Diagnoses    Acute deep vein thrombosis (DVT) of proximal vein of left lower extremity (HCC)    -  Primary   Relevant Medications   apixaban (ELIQUIS) 5 MG TABS tablet (Start on 01/18/2021)   Other Relevant Orders   CBC      Meds ordered this encounter  Medications  . apixaban (ELIQUIS) 5 MG TABS tablet    Sig: Take 2 tablets (10mg ) twice daily for 7 days, then 1 tablet (5mg ) twice daily    Dispense:  60 tablet    Refill:  2    Follow-up: Return in about 3 weeks (around 03/08/2020).    Lindell Spar, MD

## 2020-02-16 NOTE — Patient Instructions (Addendum)
Follow after 3 weeks.  You are instructed to start Eliquis for DVT as instructed.  5 mg 2 tablets twice daily for 7 days Then, 5 mg 1 tablet twice daily  Please get blood test - CBC done after 2 weeks.  If you have any signs of bleeding - blood in stool or urine, any change in mentation, excessive change in blood pressure or dizziness, please get immediate medical attention (ER).

## 2020-02-20 ENCOUNTER — Ambulatory Visit (HOSPITAL_COMMUNITY): Payer: Medicare Other

## 2020-02-21 ENCOUNTER — Telehealth: Payer: Self-pay | Admitting: *Deleted

## 2020-02-21 NOTE — Telephone Encounter (Signed)
Danielle [name] with [facility] called needing verbal orders to see pt 2 times a week for 4 weeks and 1 time a week for 4 weeks verbal orders given [phone]

## 2020-03-02 LAB — CBC
Hematocrit: 36 % (ref 34.0–46.6)
Hemoglobin: 11.3 g/dL (ref 11.1–15.9)
MCH: 28 pg (ref 26.6–33.0)
MCHC: 31.4 g/dL — ABNORMAL LOW (ref 31.5–35.7)
MCV: 89 fL (ref 79–97)
Platelets: 408 10*3/uL (ref 150–450)
RBC: 4.03 x10E6/uL (ref 3.77–5.28)
RDW: 15.9 % — ABNORMAL HIGH (ref 11.7–15.4)
WBC: 6.6 10*3/uL (ref 3.4–10.8)

## 2020-03-08 ENCOUNTER — Encounter: Payer: Self-pay | Admitting: Family Medicine

## 2020-03-08 ENCOUNTER — Other Ambulatory Visit: Payer: Self-pay

## 2020-03-08 ENCOUNTER — Telehealth: Payer: Self-pay

## 2020-03-08 ENCOUNTER — Ambulatory Visit (INDEPENDENT_AMBULATORY_CARE_PROVIDER_SITE_OTHER): Payer: Medicare Other | Admitting: Family Medicine

## 2020-03-08 VITALS — BP 138/78 | HR 106 | Temp 98.2°F | Ht 65.0 in | Wt 146.0 lb

## 2020-03-08 DIAGNOSIS — M7989 Other specified soft tissue disorders: Secondary | ICD-10-CM

## 2020-03-08 MED ORDER — GLUCOSE BLOOD VI STRP: ORAL_STRIP | 12 refills | Status: DC

## 2020-03-08 MED ORDER — SYNTHROID 100 MCG PO TABS: 100.0000 ug | ORAL_TABLET | Freq: Every morning | ORAL | 0 refills | Status: DC

## 2020-03-08 NOTE — Telephone Encounter (Signed)
Please advise her that her hemoglobin is stable even with Eliquis. So continue to take medications as scheduled. She had a visit with Jarrett Soho today. So please check with her, she might have already discussed the blood test results with the patient during the visit.

## 2020-03-08 NOTE — Assessment & Plan Note (Signed)
Doppler ultrasound of LE 9/29 showed the left popliteal, femoral, and profunda femoral veins. No DVT on right LE. She was started on Eliquis daily and is doing well with this. Will continue for 3 months. However new increased swelling of the Left knee to foot is concerning. She has fluid around the knee on PE. No dyspnea, palpitations, chest pain or dizziness. Called Ortho who can see her in the morning. She is given strict precautions on use of leg and when to go to ED if needed.

## 2020-03-08 NOTE — Progress Notes (Signed)
Subjective:  Patient ID: Danielle [name], female    DOB: February 26, 1932  Age: 86 y.o. MRN: [id number]  CC:  Chief Complaint  Patient presents with  . Joint Swelling    pain behind L knee, swollen since this morning  . Toe Pain    L foot 2nd toe started hurting this morning, feels bruised      HPI  HPI Danielle [name] isan 86 year old femaleof mine. She presents today for increased Left leg swelling from knee to foot.  Past history of uncontrolled type II DM, hypertension, CVA with residual left UEnumbness, hypothyroidismandosteoporosis.  Recently had a pubic rami fracture and left radial fracture after having a fall at home on 8/12. She had follow up with Dr Posey Pronto in office and was started blood thinner after having doppler ultrasound of LE. She had left popliteal, femoral, and profunda femoral veins acute DVT.   She has increased noted swelling compared to Right in the LE. She denies any dyspnea, chest pain, palpitations or dizziness.   She states that she tried to keep her legs elevated, which has helped her with the swelling in past. She has PT yesterday and was ok without issue. Woke up this morning with this swelling. She also states discomfort in her second toe on the Left foot. Some mild redness as well.  Patient denies fever, chills, headache, dizziness, nausea, vomiting. Patient denies any known h/o GI bleeding, appears to be tolerating Eliquis without issue..  Today patient denies signs and symptoms of COVID 19 infection including fever, chills, cough, shortness of breath, and headache. Past Medical, Surgical, Social History, Allergies, and Medications have been Reviewed.   Past Medical History:  Diagnosis Date  . Arthritis    "some joint pain once in awhile" (05/26/2017)  . Back pain at L4-L5 level 06/14/2013  . Cataract   . CVA (cerebral vascular accident) ([place]) 2019  . Encounter for screening mammogram for malignant neoplasm of breast 04/19/2019  .  Generalized weakness 06/09/2018  . Heart murmur   . History of kidney stones   . Hyperlipemia   . Hypertension   . Hypothyroidism (acquired)   . Muscle weakness (generalized) 12/17/2012  . Osteoporosis   . Pain in joint, shoulder region 12/17/2012  . Pneumothorax 02/05/2017   right-after fall  . Proximal humerus fracture 11/29/2012  . Rib fractures 02/05/2017   right side  . Right middle cerebral artery stroke ([place] ) 01/03/2019  . Shoulder fracture 12/14/2012  . Stroke ([place])   . Stroke-like symptoms 12/23/2018  . Type II diabetes mellitus (HCC)     Current Meds  Medication Sig  . acetaminophen (TYLENOL) 325 MG tablet Take 2 tablets (650 mg total) by mouth every 6 (six) hours as needed for mild pain (or Fever >/= 101).  Derrill Memo ON 01/18/2021] apixaban (ELIQUIS) 5 MG TABS tablet Take 2 tablets (10mg ) twice daily for 7 days, then 1 tablet (5mg ) twice daily  . atorvastatin (LIPITOR) 80 MG tablet TAKE 1 TABLET (80 MG TOTAL) BY MOUTH DAILY AT 6 PM.  . Calcium Carb-Cholecalciferol (CALCIUM 600 + D PO) Take 1 tablet by mouth daily.  . clopidogrel (PLAVIX) 75 MG tablet TAKE 1 TABLET BY MOUTH EVERY DAY  . diclofenac sodium (VOLTAREN) 1 % GEL Apply 2 g topically 4 (four) times daily.  . insulin glargine, 2 Unit Dial, (TOUJEO MAX SOLOSTAR) 300 UNIT/ML Solostar Pen Inject 32 Units into the skin daily before breakfast.  . Insulin Lispro (HUMALOG KWIKPEN) 200 UNIT/ML SOPN  Inject 12 Units into the skin in the morning, at noon, and at bedtime. 10-12 units BID   . methocarbamol (ROBAXIN) 500 MG tablet Take 1 tablet (500 mg total) by mouth 3 (three) times daily.  . Multiple Vitamins-Minerals (OCUVITE ADULT 50+ PO) Take 1 tablet by mouth daily.   Marland Kitchen oxyCODONE (OXY IR/ROXICODONE) 5 MG immediate release tablet Take 1 tablet (5 mg total) by mouth every 4 (four) hours as needed for moderate pain or severe pain.  Marland Kitchen senna-docusate (SENOKOT-S) 8.6-50 MG tablet Take 2 tablets by mouth 2 (two) times daily.  Marland Kitchen SYNTHROID 100  MCG tablet Take 1 tablet (100 mcg total) by mouth every morning.  . vitamin B-12 (CYANOCOBALAMIN) 1000 MCG tablet Take 1 tablet (1,000 mcg total) by mouth daily.    ROS:  Review of Systems  Constitutional: Negative.   HENT: Negative.   Eyes: Negative.   Respiratory: Negative.   Cardiovascular: Negative.   Gastrointestinal: Negative.   Genitourinary: Negative.   Musculoskeletal: Negative.        See hpi  Skin: Negative.   Neurological: Negative.   Endo/Heme/Allergies: Negative.   Psychiatric/Behavioral: Negative.      Objective:   Today's Vitals: BP 138/78 (BP Location: Right Arm, Patient Position: Sitting, Cuff Size: Normal)   Pulse (!) 106   Temp 98.2 F (36.8 C) (Tympanic)   Ht 5\' 5"  (1.651 m)   Wt 146 lb (66.2 kg)   SpO2 97%   BMI 24.30 kg/m  Vitals with BMI 03/08/2020 02/16/2020 02/15/2020  Height 5\' 5"  5\' 5"  5\' 5"   Weight 146 lbs 146 lbs 146 lbs  BMI 24.3 40.9 81.1  Systolic 914 782 956  Diastolic 78 78 67  Pulse 213 83 87     Physical Exam Vitals and nursing note reviewed.  Constitutional:      Appearance: Normal appearance. She is well-developed and well-groomed. She is obese.  HENT:     Head: Normocephalic and atraumatic.     Right Ear: External ear normal.     Left Ear: External ear normal.     Mouth/Throat:     Comments: Mask in place  Eyes:     General:        Right eye: No discharge.        Left eye: No discharge.     Conjunctiva/sclera: Conjunctivae normal.  Cardiovascular:     Rate and Rhythm: Normal rate and regular rhythm.     Pulses: Normal pulses.     Heart sounds: Normal heart sounds.  Pulmonary:     Effort: Pulmonary effort is normal.     Breath sounds: Normal breath sounds.  Musculoskeletal:     Cervical back: Normal range of motion and neck supple.     Right knee: Normal.     Left knee: Swelling and effusion present. Decreased range of motion. Tenderness present.     Left lower leg: Swelling present. 2+ Edema present.     Left  ankle: Swelling present.     Right foot: Swelling present.     Left foot: Swelling present.  Skin:    General: Skin is warm.  Neurological:     General: No focal deficit present.     Mental Status: She is alert and oriented to person, place, and time.  Psychiatric:        Attention and Perception: Attention normal.        Mood and Affect: Mood normal.        Speech: Speech normal.  Behavior: Behavior normal. Behavior is cooperative.        Thought Content: Thought content normal.        Cognition and Memory: Cognition normal.        Judgment: Judgment normal.    Assessment   1. Leg swelling     Tests ordered No orders of the defined types were placed in this encounter.    Plan: Please see assessment and plan per problem list above.   No orders of the defined types were placed in this encounter.   Patient to follow-up in 2-3 months   Note: This dictation was prepared with Dragon dictation along with smaller phrase technology. Similar sounding words can be transcribed inadequately or may not be corrected upon review. Any transcriptional errors that result from this process are unintentional.      Perlie Mayo, NP

## 2020-03-08 NOTE — Telephone Encounter (Signed)
Spoke with patient regarding her message about her appt. Her appointment was originally scheduled as a phone visit. Patient wanted to come in for ov. Asked provider if this is ok. She is ok with it. Changed appointment to an ov.

## 2020-03-08 NOTE — Telephone Encounter (Signed)
Informed via voicemail per pt request.

## 2020-03-08 NOTE — Telephone Encounter (Signed)
Pt was in office requesting her lab results she had drawn last Friday. Please advise.

## 2020-03-08 NOTE — Patient Instructions (Addendum)
  HAPPY FALL!  I appreciate the opportunity to provide you with care for your health and wellness. Today we discussed: fluid on the left knee and swelling of leg  Follow up: 2-3 months   No labs or referrals today  Avoid excessive walking on leg until you see Ortho Keep elevated when sitting. Ice knee to help swelling.  Please continue to practice social distancing to keep you, your family, and our community safe.  If you must go out, please wear a mask and practice good handwashing.  It was a pleasure to see you and I look forward to continuing to work together on your health and well-being. Please do not hesitate to call the office if you need care or have questions about your care.  Have a wonderful day and week. With Gratitude, Cherly Beach, DNP, AGNP-BC

## 2020-03-09 ENCOUNTER — Encounter: Payer: Self-pay | Admitting: Orthopedic Surgery

## 2020-03-09 ENCOUNTER — Ambulatory Visit: Payer: Medicare Other

## 2020-03-09 ENCOUNTER — Other Ambulatory Visit: Payer: Self-pay | Admitting: Orthopedic Surgery

## 2020-03-09 ENCOUNTER — Ambulatory Visit: Payer: Medicare Other | Admitting: Orthopedic Surgery

## 2020-03-09 VITALS — Ht 65.0 in

## 2020-03-09 DIAGNOSIS — M25562 Pain in left knee: Secondary | ICD-10-CM

## 2020-03-09 NOTE — Progress Notes (Signed)
New Patient Visit  Assessment: Danielle [name] is a 86 y.o. female with the following: 1.  Mild left knee prepatellar bursitis; no associated pain or redness 2.  Left second toe onychomycosis  Plan: Danielle [name] presented to clinic with mild swelling over the left knee.  She states that she has minimal pain in her left knee, and x-rays are relatively normal except for mild loss of joint space and some chondrocalcinosis.  Based on my exam, there is no need for her to limit her function.  I have encouraged her to continue working with therapy as much as possible.  I have provided her and her husband with strict return precautions which include, but are not limited to significant intra-articular left knee swelling, significant change in the amount of pain within her left knee, associated redness and stiffness within her left knee or the skin overlying her left knee becomes red, warm and swollen.  Otherwise, there is no need for her function to be limited.  In regards to her left second toe, she has developed onychomycosis of the toenail.  Given her multiple medical problems, I am reluctant to prescribe a specific medication for this right now.  I will communicate with her primary care provider in order to ensure that she gets the appropriate medication.  If she does not tolerate any of the medications, we should consider a referral to a podiatrist to take care of the toenail.  All this was explained to the patient and her husband, and they are in agreement with this plan.  Follow-up: Return if symptoms worsen or fail to improve.  Subjective:  Chief Complaint  Patient presents with  . Joint Swelling    Left knee swelling, no injury.    History of Present Illness: Danielle [name] is a 86 y.o. female who was referred by Cherly Beach, NP for left knee pain.  She has had several medical problems in the past, and most recently sustained some fractures within her pelvis.  She has been working  diligently with therapy at home, and was noted to have an increase in swelling around the left knee.  She specifically denies pain within her left knee.  She is not had to take any additional medications for her left knee.  However, they were concerned by the significant amount of swelling overlying the left knee in comparison to the right.  She is not noticed any redness around the knee.  Her range of motion is remain normal.  She has not had any specific injuries to the left knee recently.  Review of Systems: No numbness or tingling. No shortness of breath No chest pain No fevers or chills.  Medical History:  Past Medical History:  Diagnosis Date  . Arthritis    "some joint pain once in awhile" (05/26/2017)  . Back pain at L4-L5 level 06/14/2013  . Cataract   . CVA (cerebral vascular accident) ([place]) 2019  . Encounter for screening mammogram for malignant neoplasm of breast 04/19/2019  . Generalized weakness 06/09/2018  . Heart murmur   . History of kidney stones   . Hyperlipemia   . Hypertension   . Hypothyroidism (acquired)   . Muscle weakness (generalized) 12/17/2012  . Osteoporosis   . Pain in joint, shoulder region 12/17/2012  . Pneumothorax 02/05/2017   right-after fall  . Proximal humerus fracture 11/29/2012  . Rib fractures 02/05/2017   right side  . 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 Shoulder fracture 12/14/2012  .  Stroke ([place])   . Stroke-like symptoms 12/23/2018  . Type II diabetes mellitus ([place])     Past Surgical History:  Procedure Laterality Date  . AUGMENTATION MAMMAPLASTY Bilateral   . COSMETIC SURGERY    . DILATION AND CURETTAGE OF UTERUS    . EYE SURGERY    . FRACTURE SURGERY    . JOINT REPLACEMENT    . REVERSE SHOULDER ARTHROPLASTY Right 05/26/2017  . REVERSE SHOULDER ARTHROPLASTY Right 05/26/2017   Procedure: REVERSE RIGHT SHOULDER ARTHROPLASTY;  Surgeon: Meredith Pel, MD;  Location: [place];  Service: Orthopedics;  Laterality: Right;  .  TONSILLECTOMY    . TUBAL LIGATION      Family History  Problem Relation Age of Onset  . Cancer Mother   . Heart disease Father    Social History   Tobacco Use  . Smoking status: Former Smoker    Packs/day: 1.00    Years: 40.00    Pack years: 40.00    Types: Cigarettes    Quit date: 1991    Years since quitting: 30.8  . Smokeless tobacco: Never Used  Vaping Use  . Vaping Use: Never used  Substance Use Topics  . Alcohol use: No  . Drug use: No    Allergies  Allergen Reactions  . Fosamax [Alendronate Sodium] Other (See Comments)    MUSCLE ACHES  . Prednisone     Feels jittery and nauseous    Current Meds  Medication Sig  . acetaminophen (TYLENOL) 325 MG tablet Take 2 tablets (650 mg total) by mouth every 6 (six) hours as needed for mild pain (or Fever >/= 101).  Derrill Memo ON 01/18/2021] apixaban (ELIQUIS) 5 MG TABS tablet Take 2 tablets (10mg ) twice daily for 7 days, then 1 tablet (5mg ) twice daily  . atorvastatin (LIPITOR) 80 MG tablet TAKE 1 TABLET (80 MG TOTAL) BY MOUTH DAILY AT 6 PM.  . Calcium Carb-Cholecalciferol (CALCIUM 600 + D PO) Take 1 tablet by mouth daily.  . clopidogrel (PLAVIX) 75 MG tablet TAKE 1 TABLET BY MOUTH EVERY DAY  . diclofenac sodium (VOLTAREN) 1 % GEL Apply 2 g topically 4 (four) times daily.  Marland Kitchen glucose blood test strip Use as instructed check blood sugar three times daily as needed for machine pt already has  . insulin glargine, 2 Unit Dial, (TOUJEO MAX SOLOSTAR) 300 UNIT/ML Solostar Pen Inject 32 Units into the skin daily before breakfast.  . Insulin Lispro (HUMALOG KWIKPEN) 200 UNIT/ML SOPN Inject 12 Units into the skin in the morning, at noon, and at bedtime. 10-12 units BID   . methocarbamol (ROBAXIN) 500 MG tablet Take 1 tablet (500 mg total) by mouth 3 (three) times daily.  . Multiple Vitamins-Minerals (OCUVITE ADULT 50+ PO) Take 1 tablet by mouth daily.   Marland Kitchen oxyCODONE (OXY IR/ROXICODONE) 5 MG immediate release tablet Take 1 tablet (5 mg  total) by mouth every 4 (four) hours as needed for moderate pain or severe pain.  Marland Kitchen senna-docusate (SENOKOT-S) 8.6-50 MG tablet Take 2 tablets by mouth 2 (two) times daily.  Marland Kitchen SYNTHROID 100 MCG tablet Take 1 tablet (100 mcg total) by mouth every morning.  . vitamin B-12 (CYANOCOBALAMIN) 1000 MCG tablet Take 1 tablet (1,000 mcg total) by mouth daily.    Objective: Ht 5\' 5"  (1.651 m)   BMI 24.30 kg/m   Physical Exam:  General: Pleasant elderly female seated in a wheelchair  Evaluation of the left knee demonstrates no intra-articular effusion.  She does have an increase in  swelling directly overlying her patella, slightly medial as well.  This fluid is not warm.  It is mobile.  She has near full range of motion from 10-110 degrees without discomfort.  Mild tenderness along the medial and lateral joint lines.  Negative Lachman.    IMAGING: I personally ordered and reviewed the following images:  X-rays of the left knee demonstrates neutral overall alignment.  Mild loss of joint space within the medial lateral compartments.  Some chondrocalcinosis.  Impression: Mild degenerative changes of the knee with some chondrocalcinosis.     New Medications:  No orders of the defined types were placed in this encounter.     Mordecai Rasmussen, MD  03/09/2020 11:52 AM

## 2020-03-12 ENCOUNTER — Other Ambulatory Visit: Payer: Self-pay | Admitting: *Deleted

## 2020-03-12 ENCOUNTER — Telehealth: Payer: Self-pay

## 2020-03-12 DIAGNOSIS — I6523 Occlusion and stenosis of bilateral carotid arteries: Secondary | ICD-10-CM

## 2020-03-12 NOTE — Telephone Encounter (Signed)
Danielle [name] with [facility] left voicemail stating that pt was under the impression that an antibotic was sent in but pharmacy had not received it. I did not see this in the note. Please advise.

## 2020-03-13 NOTE — Telephone Encounter (Signed)
Not that I am aware of. Ortho suggest she see a podiatrist for the toe. If she is willing to do that we will refer.

## 2020-03-15 ENCOUNTER — Other Ambulatory Visit: Payer: Self-pay

## 2020-03-15 ENCOUNTER — Ambulatory Visit (INDEPENDENT_AMBULATORY_CARE_PROVIDER_SITE_OTHER): Payer: Medicare Other | Admitting: Orthopedic Surgery

## 2020-03-15 ENCOUNTER — Ambulatory Visit: Payer: Medicare Other

## 2020-03-15 DIAGNOSIS — S62102D Fracture of unspecified carpal bone, left wrist, subsequent encounter for fracture with routine healing: Secondary | ICD-10-CM

## 2020-03-15 DIAGNOSIS — S32502D Unspecified fracture of left pubis, subsequent encounter for fracture with routine healing: Secondary | ICD-10-CM | POA: Diagnosis not present

## 2020-03-15 NOTE — Progress Notes (Signed)
Chief Complaint  Patient presents with  . Wrist Pain    left  . Hip Pain    fracture care     DOI: 12/29/19 PELVIS AND WRIST   11 WEEKS AGO   XRAYS PELVIS TODAY  WRIST HEALED, we excepted the malalignment based on the overall patient's condition  Patient had an evaluation for prepatellar bursitis left knee last week with Dr. Gwyneth Sprout doing well   xrays show ND left pelvic fx   I released her today

## 2020-03-15 NOTE — Telephone Encounter (Signed)
Pt and pt husband both informed. Pt wants to hold on referral right now. She says her toe looks and feels much better.

## 2020-03-30 ENCOUNTER — Other Ambulatory Visit: Payer: Self-pay | Admitting: Family Medicine

## 2020-04-10 ENCOUNTER — Other Ambulatory Visit: Payer: Self-pay

## 2020-04-10 ENCOUNTER — Ambulatory Visit: Payer: Medicare Other | Admitting: Vascular Surgery

## 2020-04-10 ENCOUNTER — Ambulatory Visit (HOSPITAL_COMMUNITY)
Admission: RE | Admit: 2020-04-10 | Discharge: 2020-04-10 | Disposition: A | Discharge status: Home / Self Care | Payer: Medicare Other | Source: Ambulatory Visit | Attending: Vascular Surgery | Admitting: Vascular Surgery

## 2020-04-10 ENCOUNTER — Encounter: Payer: Self-pay | Admitting: Vascular Surgery

## 2020-04-10 DIAGNOSIS — I6523 Occlusion and stenosis of bilateral carotid arteries: Secondary | ICD-10-CM | POA: Diagnosis not present

## 2020-04-10 DIAGNOSIS — I63231 Cerebral infarction due to unspecified occlusion or stenosis of right carotid arteries: Secondary | ICD-10-CM | POA: Diagnosis not present

## 2020-04-10 DIAGNOSIS — I63239 Cerebral infarction due to unspecified occlusion or stenosis of unspecified carotid arteries: Secondary | ICD-10-CM | POA: Insufficient documentation

## 2020-04-10 NOTE — Progress Notes (Signed)
Patient name: Danielle [name] MRN: [id number] DOB: December 03, 1931 Sex: female  REASON FOR VISIT: 6 month surveillance for follow-up of carotid disease in setting of known right carotid occlusion and left ICA moderate stenosis  HPI: Danielle [name] is a 86 y.o. female that presents for 6 month  follow-up for ongoing surveillance of her carotid disease.  She was initially seen in the hospital for right-sided CVA with fairly profound left sided hemiparesis.  Vascular surgery was called for bilateral string signs of the ICAs.  On the right which was the side of interest, she had a distal occlusion of the ICA in the carotid siphon and her duplex showed a thump signal.  As a result I did not offer revascularization given distal occlusion and no benefit to revascularization.  Ultimately her right ICA occluded down to bifurcation.  Left ICA has been followed for moderate stenosis.  On follow-up today she has had no new neurologic events since last visit.  Still has profound weakness in the left arm and going  to therapy.  She feels she is slowly getting strength back in the left leg.  She's had no additional neurologic events since I saw her over the last 6 months.  She was diagnosed with a left leg DVT and started on Eliquis.  States her left leg is back to normal.  Past Medical History:  Diagnosis Date  . Arthritis    "some joint pain once in awhile" (05/26/2017)  . Back pain at L4-L5 level 06/14/2013  . Cataract   . CVA (cerebral vascular accident) ([place]) 2019  . Encounter for screening mammogram for malignant neoplasm of breast 04/19/2019  . Generalized weakness 06/09/2018  . Heart murmur   . History of kidney stones   . Hyperlipemia   . Hypertension   . Hypothyroidism (acquired)   . Muscle weakness (generalized) 12/17/2012  . Osteoporosis   . Pain in joint, shoulder region 12/17/2012  . Pneumothorax 02/05/2017   right-after fall  . Proximal humerus fracture 11/29/2012  . Rib fractures 02/05/2017    right side  . 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 Shoulder fracture 12/14/2012  . Stroke ([place])   . Stroke-like symptoms 12/23/2018  . Type II diabetes mellitus ([place])     Past Surgical History:  Procedure Laterality Date  . AUGMENTATION MAMMAPLASTY Bilateral   . COSMETIC SURGERY    . DILATION AND CURETTAGE OF UTERUS    . EYE SURGERY    . FRACTURE SURGERY    . JOINT REPLACEMENT    . REVERSE SHOULDER ARTHROPLASTY Right 05/26/2017  . REVERSE SHOULDER ARTHROPLASTY Right 05/26/2017   Procedure: REVERSE RIGHT SHOULDER ARTHROPLASTY;  Surgeon: Meredith Pel, MD;  Location: [place];  Service: Orthopedics;  Laterality: Right;  . TONSILLECTOMY    . TUBAL LIGATION      Family History  Problem Relation Age of Onset  . Cancer Mother   . Heart disease Father     SOCIAL HISTORY: Social History   Tobacco Use  . Smoking status: Former Smoker    Packs/day: 1.00    Years: 40.00    Pack years: 40.00    Types: Cigarettes    Quit date: 1991    Years since quitting: 30.9  . Smokeless tobacco: Never Used  Substance Use Topics  . Alcohol use: No    Allergies  Allergen Reactions  . Fosamax [Alendronate Sodium] Other (See Comments)    MUSCLE ACHES  . Prednisone  Feels jittery and nauseous    Current Outpatient Medications  Medication Sig Dispense Refill  . acetaminophen (TYLENOL) 325 MG tablet Take 2 tablets (650 mg total) by mouth every 6 (six) hours as needed for mild pain (or Fever >/= 101).    Derrill Memo ON 01/18/2021] apixaban (ELIQUIS) 5 MG TABS tablet Take 2 tablets (10mg ) twice daily for 7 days, then 1 tablet (5mg ) twice daily 60 tablet 2  . atorvastatin (LIPITOR) 80 MG tablet TAKE 1 TABLET (80 MG TOTAL) BY MOUTH DAILY AT 6 PM. 90 tablet 1  . Calcium Carb-Cholecalciferol (CALCIUM 600 + D PO) Take 1 tablet by mouth daily.    . clopidogrel (PLAVIX) 75 MG tablet TAKE 1 TABLET BY MOUTH EVERY DAY 90 tablet 4  . diclofenac sodium (VOLTAREN) 1 % GEL Apply 2 g  topically 4 (four) times daily. 2 g 0  . glucose blood test strip Use as instructed check blood sugar three times daily as needed for machine pt already has 100 each 12  . insulin glargine, 2 Unit Dial, (TOUJEO MAX SOLOSTAR) 300 UNIT/ML Solostar Pen Inject 32 Units into the skin daily before breakfast. 3 mL 2  . Insulin Lispro (HUMALOG KWIKPEN) 200 UNIT/ML SOPN Inject 12 Units into the skin in the morning, at noon, and at bedtime. 10-12 units BID     . methocarbamol (ROBAXIN) 500 MG tablet Take 1 tablet (500 mg total) by mouth 3 (three) times daily. 90 tablet 2  . Multiple Vitamins-Minerals (OCUVITE ADULT 50+ PO) Take 1 tablet by mouth daily.     Marland Kitchen oxyCODONE (OXY IR/ROXICODONE) 5 MG immediate release tablet Take 1 tablet (5 mg total) by mouth every 4 (four) hours as needed for moderate pain or severe pain. 12 tablet 0  . senna-docusate (SENOKOT-S) 8.6-50 MG tablet Take 2 tablets by mouth 2 (two) times daily. 120 tablet 2  . SYNTHROID 100 MCG tablet Take 1 tablet (100 mcg total) by mouth every morning. 30 tablet 0  . vitamin B-12 (CYANOCOBALAMIN) 1000 MCG tablet Take 1 tablet (1,000 mcg total) by mouth daily. 30 tablet 0  . amLODipine (NORVASC) 5 MG tablet Take 1 tablet (5 mg total) by mouth daily. 30 tablet 11   No current facility-administered medications for this visit.    REVIEW OF SYSTEMS:  [X]  denotes positive finding, [ ]  denotes negative finding Cardiac  Comments:  Chest pain or chest pressure:    Shortness of breath upon exertion:    Short of breath when lying flat:    Irregular heart rhythm:        Vascular    Pain in calf, thigh, or hip brought on by ambulation:    Pain in feet at night that wakes you up from your sleep:     Blood clot in your veins:    Leg swelling:         Pulmonary    Oxygen at home:    Productive cough:     Wheezing:         Neurologic    Sudden weakness in arms or legs:     Sudden numbness in arms or legs:     Sudden onset of difficulty speaking or  slurred speech:    Temporary loss of vision in one eye:     Problems with dizziness:         Gastrointestinal    Blood in stool:     Vomited blood:         Genitourinary  Burning when urinating:     Blood in urine:        Psychiatric    Major depression:         Hematologic    Bleeding problems:    Problems with blood clotting too easily:        Skin    Rashes or ulcers:        Constitutional    Fever or chills:      PHYSICAL EXAM: Vitals:   04/10/20 1500  BP: (!) 150/65  Pulse: 82  Resp: 14  SpO2: 98%  Weight: 148 lb (67.1 kg)  Height: 5\' 5"  (1.651 m)    GENERAL: The patient is a well-nourished female, in no acute distress. The vital signs are documented above. CARDIAC: There is a regular rate and rhythm.  PULMONARY: There is good air exchange bilaterally without wheezing or rales. ABDOMEN: Soft and non-tender with normal pitched bowel sounds.  MUSCULOSKELETAL: There are no major deformities or cyanosis. NEUROLOGIC: Left arm 1/5. Left leg 4/5.  Right arm and leg neurologically intact.  CN II-XII grossly intact.    DATA:   Carotid duplex today shows known chronic right ICA occlusion.  On the left she has a velocity of 233/54 in the ICA consistent with a 40 to 59% stenosis but essentially unchanged from a previous velocity of 234/68 approximately 6 months ago in the 60 to 79% range.  Assessment/Plan:  86 year old female presents for ongoing surveillance of her carotid disease in setting of known right ICA occlusion.  As previously noted, we did not offer her right carotid revascularization given that there was evidence of a distal right carotid siphon occlusion and there was no benefit to revascularization.  As discussed with husband at the time already had neurologic event and revascularization would not reverse neurologic deficit.  Went on to occluded right carotid down to bifurcation.    We have now been following a left moderate 60 to 79% stenosis that is  asymptomatic.  On duplex today her velocities are essentially unchanged at 233/54 now in the 40 to 59% range.  Discussed based on last several duplexes essentially around the 60% range.  Given asymptomatic continue observation and would offer revascularization if more than 80%.  We will see her again in 6 months with another carotid duplex.  She knows to call with questions or concerns.   Marty Heck, MD [facility] Office: [phone]

## 2020-04-25 ENCOUNTER — Other Ambulatory Visit: Payer: Self-pay

## 2020-04-25 DIAGNOSIS — IMO0002 Reserved for concepts with insufficient information to code with codable children: Secondary | ICD-10-CM

## 2020-04-25 DIAGNOSIS — E1121 Type 2 diabetes mellitus with diabetic nephropathy: Secondary | ICD-10-CM

## 2020-04-25 MED ORDER — GLUCOSE BLOOD VI STRP: ORAL_STRIP | 12 refills | Status: AC

## 2020-05-04 ENCOUNTER — Other Ambulatory Visit: Payer: Self-pay | Admitting: Internal Medicine

## 2020-05-04 DIAGNOSIS — I824Y2 Acute embolism and thrombosis of unspecified deep veins of left proximal lower extremity: Secondary | ICD-10-CM

## 2020-05-04 NOTE — Telephone Encounter (Signed)
Jarrett Soho, I received refill request for Eliquis. I will leave it up to you whether you would want to continue anticoagulation for additional 3 months for her DVT.

## 2020-05-08 ENCOUNTER — Other Ambulatory Visit: Payer: Self-pay

## 2020-05-08 MED ORDER — ATORVASTATIN CALCIUM 80 MG PO TABS: 80.0000 mg | ORAL_TABLET | Freq: Every day | ORAL | 1 refills | Status: AC

## 2020-05-15 ENCOUNTER — Other Ambulatory Visit: Payer: Self-pay | Admitting: Family Medicine

## 2020-05-15 DIAGNOSIS — I824Y2 Acute embolism and thrombosis of unspecified deep veins of left proximal lower extremity: Secondary | ICD-10-CM

## 2020-05-22 NOTE — Telephone Encounter (Signed)
Please call and see if she is up moving around more now or if she is still having trouble with mobility.  As, if she is moving more, she might not need the blood thinner anylonger. Thank you

## 2020-05-23 ENCOUNTER — Other Ambulatory Visit: Payer: Self-pay | Admitting: Family Medicine

## 2020-05-23 DIAGNOSIS — I824Y2 Acute embolism and thrombosis of unspecified deep veins of left proximal lower extremity: Secondary | ICD-10-CM

## 2020-05-23 NOTE — Telephone Encounter (Signed)
Ok- I will send in a refill for 3 months and after that we will stop.  Thank you -just let her know

## 2020-05-23 NOTE — Telephone Encounter (Signed)
Per Dahlia Client, since pt is taking Plavix and up moving more now no refill on Eliquis will be sent at this time. Husband informed.

## 2020-05-23 NOTE — Telephone Encounter (Signed)
Great- Thank you for calling back prior to that refill. Plavix should be good, if she is moving well.

## 2020-05-23 NOTE — Telephone Encounter (Signed)
Pt states that some days she can get up and move, and some days she cannot. It is day to day. Her mobility is not the best. States she is currently out of the Eliquis.

## 2020-05-29 ENCOUNTER — Other Ambulatory Visit: Payer: Self-pay | Admitting: Family Medicine

## 2020-06-09 ENCOUNTER — Other Ambulatory Visit: Payer: Self-pay | Admitting: Family Medicine

## 2020-06-26 ENCOUNTER — Other Ambulatory Visit: Payer: Self-pay

## 2020-06-26 ENCOUNTER — Emergency Department (HOSPITAL_COMMUNITY): Payer: Medicare Other

## 2020-06-26 ENCOUNTER — Inpatient Hospital Stay (HOSPITAL_COMMUNITY)
Admission: EM | Admit: 2020-06-26 | Discharge: 2020-06-28 | DRG: 637 | Disposition: A | Discharge status: Home Health | Payer: Medicare Other | Attending: Internal Medicine | Admitting: Internal Medicine

## 2020-06-26 ENCOUNTER — Encounter (HOSPITAL_COMMUNITY): Payer: Self-pay

## 2020-06-26 ENCOUNTER — Observation Stay (HOSPITAL_COMMUNITY): Payer: Medicare Other

## 2020-06-26 DIAGNOSIS — Z888 Allergy status to other drugs, medicaments and biological substances status: Secondary | ICD-10-CM | POA: Diagnosis not present

## 2020-06-26 DIAGNOSIS — Z87891 Personal history of nicotine dependence: Secondary | ICD-10-CM

## 2020-06-26 DIAGNOSIS — I639 Cerebral infarction, unspecified: Secondary | ICD-10-CM | POA: Diagnosis not present

## 2020-06-26 DIAGNOSIS — Z8249 Family history of ischemic heart disease and other diseases of the circulatory system: Secondary | ICD-10-CM

## 2020-06-26 DIAGNOSIS — I447 Left bundle-branch block, unspecified: Secondary | ICD-10-CM | POA: Diagnosis not present

## 2020-06-26 DIAGNOSIS — Z87442 Personal history of urinary calculi: Secondary | ICD-10-CM | POA: Diagnosis not present

## 2020-06-26 DIAGNOSIS — E119 Type 2 diabetes mellitus without complications: Secondary | ICD-10-CM

## 2020-06-26 DIAGNOSIS — E538 Deficiency of other specified B group vitamins: Secondary | ICD-10-CM | POA: Diagnosis present

## 2020-06-26 DIAGNOSIS — I69354 Hemiplegia and hemiparesis following cerebral infarction affecting left non-dominant side: Secondary | ICD-10-CM | POA: Diagnosis not present

## 2020-06-26 DIAGNOSIS — Z7902 Long term (current) use of antithrombotics/antiplatelets: Secondary | ICD-10-CM

## 2020-06-26 DIAGNOSIS — G9341 Metabolic encephalopathy: Secondary | ICD-10-CM | POA: Diagnosis present

## 2020-06-26 DIAGNOSIS — E785 Hyperlipidemia, unspecified: Secondary | ICD-10-CM | POA: Diagnosis not present

## 2020-06-26 DIAGNOSIS — E1121 Type 2 diabetes mellitus with diabetic nephropathy: Secondary | ICD-10-CM | POA: Diagnosis present

## 2020-06-26 DIAGNOSIS — R220 Localized swelling, mass and lump, head: Secondary | ICD-10-CM | POA: Diagnosis not present

## 2020-06-26 DIAGNOSIS — I618 Other nontraumatic intracerebral hemorrhage: Secondary | ICD-10-CM | POA: Diagnosis not present

## 2020-06-26 DIAGNOSIS — N390 Urinary tract infection, site not specified: Secondary | ICD-10-CM

## 2020-06-26 DIAGNOSIS — Z96611 Presence of right artificial shoulder joint: Secondary | ICD-10-CM | POA: Diagnosis not present

## 2020-06-26 DIAGNOSIS — E039 Hypothyroidism, unspecified: Secondary | ICD-10-CM | POA: Diagnosis present

## 2020-06-26 DIAGNOSIS — I6522 Occlusion and stenosis of left carotid artery: Secondary | ICD-10-CM | POA: Diagnosis not present

## 2020-06-26 DIAGNOSIS — Z79899 Other long term (current) drug therapy: Secondary | ICD-10-CM

## 2020-06-26 DIAGNOSIS — I6930 Unspecified sequelae of cerebral infarction: Secondary | ICD-10-CM

## 2020-06-26 DIAGNOSIS — G8191 Hemiplegia, unspecified affecting right dominant side: Secondary | ICD-10-CM | POA: Diagnosis not present

## 2020-06-26 DIAGNOSIS — G459 Transient cerebral ischemic attack, unspecified: Secondary | ICD-10-CM | POA: Diagnosis not present

## 2020-06-26 DIAGNOSIS — M810 Age-related osteoporosis without current pathological fracture: Secondary | ICD-10-CM | POA: Diagnosis present

## 2020-06-26 DIAGNOSIS — I6612 Occlusion and stenosis of left anterior cerebral artery: Secondary | ICD-10-CM | POA: Diagnosis not present

## 2020-06-26 DIAGNOSIS — E11649 Type 2 diabetes mellitus with hypoglycemia without coma: Secondary | ICD-10-CM | POA: Diagnosis not present

## 2020-06-26 DIAGNOSIS — Z743 Need for continuous supervision: Secondary | ICD-10-CM | POA: Diagnosis not present

## 2020-06-26 DIAGNOSIS — Z79890 Hormone replacement therapy (postmenopausal): Secondary | ICD-10-CM

## 2020-06-26 DIAGNOSIS — Z20822 Contact with and (suspected) exposure to covid-19: Secondary | ICD-10-CM | POA: Diagnosis not present

## 2020-06-26 DIAGNOSIS — I10 Essential (primary) hypertension: Secondary | ICD-10-CM | POA: Diagnosis present

## 2020-06-26 DIAGNOSIS — I63522 Cerebral infarction due to unspecified occlusion or stenosis of left anterior cerebral artery: Secondary | ICD-10-CM

## 2020-06-26 DIAGNOSIS — I6389 Other cerebral infarction: Secondary | ICD-10-CM

## 2020-06-26 DIAGNOSIS — R531 Weakness: Secondary | ICD-10-CM | POA: Diagnosis not present

## 2020-06-26 DIAGNOSIS — I6523 Occlusion and stenosis of bilateral carotid arteries: Secondary | ICD-10-CM | POA: Diagnosis not present

## 2020-06-26 DIAGNOSIS — R29818 Other symptoms and signs involving the nervous system: Secondary | ICD-10-CM | POA: Diagnosis not present

## 2020-06-26 DIAGNOSIS — IMO0002 Reserved for concepts with insufficient information to code with codable children: Secondary | ICD-10-CM

## 2020-06-26 DIAGNOSIS — R404 Transient alteration of awareness: Secondary | ICD-10-CM | POA: Diagnosis not present

## 2020-06-26 DIAGNOSIS — Z794 Long term (current) use of insulin: Secondary | ICD-10-CM

## 2020-06-26 DIAGNOSIS — D329 Benign neoplasm of meninges, unspecified: Secondary | ICD-10-CM | POA: Diagnosis present

## 2020-06-26 LAB — DIFFERENTIAL
Abs Immature Granulocytes: 0.01 10*3/uL (ref 0.00–0.07)
Basophils Absolute: 0 10*3/uL (ref 0.0–0.1)
Basophils Relative: 1 %
Eosinophils Absolute: 0.1 10*3/uL (ref 0.0–0.5)
Eosinophils Relative: 1 %
Immature Granulocytes: 0 %
Lymphocytes Relative: 25 %
Lymphs Abs: 2 10*3/uL (ref 0.7–4.0)
Monocytes Absolute: 0.8 10*3/uL (ref 0.1–1.0)
Monocytes Relative: 10 %
Neutro Abs: 4.9 10*3/uL (ref 1.7–7.7)
Neutrophils Relative %: 63 %

## 2020-06-26 LAB — CBC
HCT: 36.6 % (ref 36.0–46.0)
Hemoglobin: 12 g/dL (ref 12.0–15.0)
MCH: 28.8 pg (ref 26.0–34.0)
MCHC: 32.8 g/dL (ref 30.0–36.0)
MCV: 88 fL (ref 80.0–100.0)
Platelets: 306 10*3/uL (ref 150–400)
RBC: 4.16 MIL/uL (ref 3.87–5.11)
RDW: 18.5 % — ABNORMAL HIGH (ref 11.5–15.5)
WBC: 7.8 10*3/uL (ref 4.0–10.5)
nRBC: 0 % (ref 0.0–0.2)

## 2020-06-26 LAB — CBG MONITORING, ED
Glucose-Capillary: 135 mg/dL — ABNORMAL HIGH (ref 70–99)
Glucose-Capillary: 61 mg/dL — ABNORMAL LOW (ref 70–99)

## 2020-06-26 LAB — COMPREHENSIVE METABOLIC PANEL
ALT: 26 U/L (ref 0–44)
AST: 46 U/L — ABNORMAL HIGH (ref 15–41)
Albumin: 3.5 g/dL (ref 3.5–5.0)
Alkaline Phosphatase: 96 U/L (ref 38–126)
Anion gap: 7 (ref 5–15)
BUN: 24 mg/dL — ABNORMAL HIGH (ref 8–23)
CO2: 23 mmol/L (ref 22–32)
Calcium: 9.3 mg/dL (ref 8.9–10.3)
Chloride: 109 mmol/L (ref 98–111)
Creatinine, Ser: 0.8 mg/dL (ref 0.44–1.00)
GFR, Estimated: >60 mL/min (ref >60)
Glucose, Bld: 62 mg/dL — ABNORMAL LOW (ref 70–99)
Potassium: 3.8 mmol/L (ref 3.5–5.1)
Sodium: 139 mmol/L (ref 135–145)
Total Bilirubin: 0.7 mg/dL (ref 0.3–1.2)
Total Protein: 6.5 g/dL (ref 6.5–8.1)

## 2020-06-26 LAB — VITAMIN B12: Vitamin B-12: 1443 pg/mL — ABNORMAL HIGH (ref 180–914)

## 2020-06-26 LAB — PROTIME-INR
INR: 0.9 (ref 0.8–1.2)
Prothrombin Time: 12.2 seconds (ref 11.4–15.2)

## 2020-06-26 LAB — ECHOCARDIOGRAM COMPLETE
AR max vel: 1.82 cm2
AV Area VTI: 1.7 cm2
AV Area mean vel: 1.7 cm2
AV Mean grad: 8.7 mmHg
AV Peak grad: 14.4 mmHg
Ao pk vel: 1.9 m/s
Area-P 1/2: 3.58 cm2
Height: 63 in
S' Lateral: 2.5 cm
Weight: 2420.8 oz

## 2020-06-26 LAB — RESP PANEL BY RT-PCR (FLU A&B, COVID) ARPGX2
Influenza A by PCR: NEGATIVE
Influenza B by PCR: NEGATIVE
SARS Coronavirus 2 by RT PCR: NEGATIVE

## 2020-06-26 LAB — GLUCOSE, CAPILLARY
Glucose-Capillary: 316 mg/dL — ABNORMAL HIGH (ref 70–99)
Glucose-Capillary: 257 mg/dL — ABNORMAL HIGH (ref 70–99)

## 2020-06-26 LAB — APTT: aPTT: 26 seconds (ref 24–36)

## 2020-06-26 LAB — TSH: TSH: 0.049 u[IU]/mL — ABNORMAL LOW (ref 0.350–4.500)

## 2020-06-26 LAB — ETHANOL: Alcohol, Ethyl (B): <10 mg/dL (ref <10)

## 2020-06-26 MED ORDER — ATORVASTATIN CALCIUM 40 MG PO TABS
80.0000 mg | ORAL_TABLET | Freq: Every day | ORAL | Status: DC
  Administered 2020-06-26 – 2020-06-27 (×2): 80 mg via ORAL
  Filled 2020-06-26 (×2): qty 2

## 2020-06-26 MED ORDER — DEXTROSE 50 % IV SOLN
INTRAVENOUS | Status: AC
  Administered 2020-06-26: 50 mL
  Filled 2020-06-26: qty 50

## 2020-06-26 MED ORDER — CLOPIDOGREL BISULFATE 75 MG PO TABS
75.0000 mg | ORAL_TABLET | Freq: Every day | ORAL | Status: DC
  Administered 2020-06-27 – 2020-06-28 (×2): 75 mg via ORAL
  Filled 2020-06-26 (×3): qty 1

## 2020-06-26 MED ORDER — OXYCODONE HCL 5 MG PO TABS: 5.0000 mg | ORAL_TABLET | Freq: Four times a day (QID) | ORAL | Status: DC | PRN

## 2020-06-26 MED ORDER — ACETAMINOPHEN 325 MG PO TABS: 650.0000 mg | ORAL_TABLET | ORAL | Status: DC | PRN

## 2020-06-26 MED ORDER — ACETAMINOPHEN 650 MG RE SUPP: 650.0000 mg | RECTAL | Status: DC | PRN

## 2020-06-26 MED ORDER — IOHEXOL 350 MG/ML SOLN
100.0000 mL | Freq: Once | INTRAVENOUS | Status: AC | PRN
  Administered 2020-06-26: 100 mL via INTRAVENOUS

## 2020-06-26 MED ORDER — INSULIN ASPART 100 UNIT/ML ~~LOC~~ SOLN
0.0000 [IU] | Freq: Every day | SUBCUTANEOUS | Status: DC
  Administered 2020-06-26: 4 [IU] via SUBCUTANEOUS
  Administered 2020-06-27: 2 [IU] via SUBCUTANEOUS

## 2020-06-26 MED ORDER — LEVOTHYROXINE SODIUM 50 MCG PO TABS
50.0000 ug | ORAL_TABLET | Freq: Every day | ORAL | Status: DC
  Administered 2020-06-27 – 2020-06-28 (×2): 50 ug via ORAL
  Filled 2020-06-26 (×2): qty 1

## 2020-06-26 MED ORDER — INSULIN ASPART 100 UNIT/ML ~~LOC~~ SOLN
0.0000 [IU] | Freq: Three times a day (TID) | SUBCUTANEOUS | Status: DC
  Administered 2020-06-27 (×2): 5 [IU] via SUBCUTANEOUS
  Administered 2020-06-27 – 2020-06-28 (×2): 3 [IU] via SUBCUTANEOUS

## 2020-06-26 MED ORDER — SODIUM CHLORIDE 0.9 % IV SOLN: INTRAVENOUS | Status: DC

## 2020-06-26 MED ORDER — INSULIN DETEMIR 100 UNIT/ML ~~LOC~~ SOLN
5.0000 [IU] | Freq: Every day | SUBCUTANEOUS | Status: DC
  Administered 2020-06-26: 5 [IU] via SUBCUTANEOUS
  Filled 2020-06-26 (×2): qty 0.05

## 2020-06-26 MED ORDER — ACETAMINOPHEN 160 MG/5ML PO SOLN: 650.0000 mg | ORAL | Status: DC | PRN

## 2020-06-26 MED ORDER — VITAMIN B-12 1000 MCG PO TABS
1000.0000 ug | ORAL_TABLET | Freq: Every day | ORAL | Status: DC
  Administered 2020-06-26 – 2020-06-28 (×3): 1000 ug via ORAL
  Filled 2020-06-26 (×3): qty 1

## 2020-06-26 MED ORDER — SENNOSIDES-DOCUSATE SODIUM 8.6-50 MG PO TABS: 1.0000 | ORAL_TABLET | Freq: Every evening | ORAL | Status: DC | PRN

## 2020-06-26 MED ORDER — HEPARIN SODIUM (PORCINE) 5000 UNIT/ML IJ SOLN
5000.0000 [IU] | Freq: Three times a day (TID) | INTRAMUSCULAR | Status: DC
  Administered 2020-06-26 – 2020-06-28 (×5): 5000 [IU] via SUBCUTANEOUS
  Filled 2020-06-26 (×5): qty 1

## 2020-06-26 MED ORDER — SENNOSIDES-DOCUSATE SODIUM 8.6-50 MG PO TABS
2.0000 | ORAL_TABLET | Freq: Two times a day (BID) | ORAL | Status: DC
  Administered 2020-06-26 – 2020-06-28 (×3): 2 via ORAL
  Filled 2020-06-26 (×3): qty 2

## 2020-06-26 MED ORDER — STROKE: EARLY STAGES OF RECOVERY BOOK
Freq: Once | Status: AC
  Filled 2020-06-26: qty 1

## 2020-06-26 NOTE — ED Notes (Signed)
Pt taken back to CT.

## 2020-06-26 NOTE — Evaluation (Signed)
Physical Therapy Evaluation Patient Details Name: Danielle [name] MRN: [id number] DOB: [birth year] Today's Date: 06/26/2020   History of Present Illness  Danielle [name] is a 87 y.o. female with medical history significant for stroke with L hemiplegia, HTN, osteoporosis, diabetes presents with current c/o R side weakness and some confusion; MRI pending.  Clinical Impression  Pt admitted with above diagnosis. Pt opens eyes to commands and able to follow commands with increased time and cues. Spouse enters room during eval and reports pt does appear slower than normal, but improved since this morning at breakfast. Pt comes to sitting EOB with mod A, maintains heavy posterior lean despite cues for upright sitting. Pt comes to standing with R HHA, pulling on hand while maintaining BLE braced against front of bed with heavy posterior lean. Pt attempts to take sidestep up towards [facility] with LOB due to heavy posterior lean. MMT of knee and ankle bil completed while supine in bed, unable to perform while seated EOB due to heavy posterior lean requiring assist at trunk to maintain upright sitting. Recommending CIR at this time; will continue to monitor progress to determine if d/c recs need to be updated. Pt currently with functional limitations due to the deficits listed below (see PT Problem List). Pt will benefit from skilled PT to increase their independence and safety with mobility to allow discharge to the venue listed below.       Follow Up Recommendations CIR    Equipment Recommendations  None recommended by PT    Recommendations for Other Services       Precautions / Restrictions Precautions Precautions: Fall Precaution Comments: L hemiplegia Restrictions Weight Bearing Restrictions: No      Mobility  Bed Mobility Overal bed mobility: Needs Assistance Bed Mobility: Supine to Sit;Sit to Supine  Supine to sit: Mod assist Sit to supine: Mod assist   General bed mobility comments:  mod A to upright trunk, initiall attempting to pull on therapist's hand like bedrail then requiring assist with bedpad to complete transfer due to posterior lean; mod A to lift BLE back into bed and mod A +2 to scoot up in bed using bedpad    Transfers Overall transfer level: Needs assistance Equipment used: 1 person hand held assist Transfers: Sit to/from Stand Sit to Stand: Mod assist  General transfer comment: BLE braced on bed, R HHA pulling on therapist to upright trunk, weight posterior that doesn't improve with cues  Ambulation/Gait  General Gait Details: attempted to take sidestep with LOB requiring assist to return to sitting  Stairs            Wheelchair Mobility    Modified Rankin (Stroke Patients Only)       Balance Overall balance assessment: Needs assistance Sitting-balance support: Feet supported;Single extremity supported Sitting balance-Leahy Scale: Poor Sitting balance - Comments: heavy posterior lean requiring min-mod A at trunk to maintain upright sitting Postural control: Posterior lean Standing balance support: During functional activity;Single extremity supported Standing balance-Leahy Scale: Poor Standing balance comment: heavy posterior lean on bed with BLE, reliant on single UE support          Pertinent Vitals/Pain Pain Assessment: Faces Faces Pain Scale: No hurt    Home Living Family/patient expects to be discharged to:: Private residence Living Arrangements: Spouse/significant other Available Help at Discharge: Family;Available 24 hours/day Type of Home: House Home Access: Stairs to enter Entrance Stairs-Rails: None Entrance Stairs-Number of Steps: 2 - very wide/deep   Home Equipment: Walker - 2 wheels;Cane -  single point;Bedside commode;Shower seat;Wheelchair - manual;Cane - quad      Prior Function Level of Independence: Needs assistance   Gait / Transfers Assistance Needed: spouse reports pt ambulates with quad cane and gait  belt with spouse or aide steadying pt while she ambulates throughout the home, WC for community distances  ADL's / Homemaking Assistance Needed: spouse reports he or aide assists pt with bathing and dressing, pt can feed self  Comments: Aide 7 hrs/day, 5 days/wk assists with transfers, bathing.     Hand Dominance        Extremity/Trunk Assessment   Upper Extremity Assessment Upper Extremity Assessment: Defer to OT evaluation    Lower Extremity Assessment Lower Extremity Assessment: Generalized weakness;RLE deficits/detail;LLE deficits/detail RLE Deficits / Details: knee and ankle 3+/5 tested in supine, difficulty testing in sitting due to posterior lean LLE Deficits / Details: knee and ankle 3/5 tested in supine, difficulty testing in sitting due to posterior lean    Cervical / Trunk Assessment Cervical / Trunk Assessment: Kyphotic  Communication   Communication:  (pt appropriate, but slow to respond to questions/cues, unsure if HOH or receptive difficulties)  Cognition Arousal/Alertness: Lethargic Behavior During Therapy: Flat affect Overall Cognitive Status: Impaired/Different from baseline  General Comments: Pt appears tired and flat, slow to respond to questions and cues. Pt's spouse at bedside reports pt is "slower than normal" but she has improved since this morning.      General Comments      Exercises     Assessment/Plan    PT Assessment Patient needs continued PT services  PT Problem List Decreased strength;Decreased range of motion;Decreased activity tolerance;Decreased balance;Decreased mobility;Decreased coordination;Decreased cognition;Decreased knowledge of use of DME;Decreased safety awareness       PT Treatment Interventions DME instruction;Gait training;Functional mobility training;Therapeutic activities;Therapeutic exercise;Balance training;Neuromuscular re-education;Cognitive remediation;Patient/family education    PT Goals (Current goals can be  found in the Care Plan section)  Acute Rehab PT Goals Patient Stated Goal: "back to normal" PT Goal Formulation: With family Time For Goal Achievement: 07/10/20 Potential to Achieve Goals: Fair    Frequency Min 5X/week   Barriers to discharge        Co-evaluation               AM-PAC PT "6 Clicks" Mobility  Outcome Measure Help needed turning from your back to your side while in a flat bed without using bedrails?: A Lot Help needed moving from lying on your back to sitting on the side of a flat bed without using bedrails?: A Lot Help needed moving to and from a bed to a chair (including a wheelchair)?: A Lot Help needed standing up from a chair using your arms (e.g., wheelchair or bedside chair)?: A Lot Help needed to walk in hospital room?: Total Help needed climbing 3-5 steps with a railing? : Total 6 Click Score: 10    End of Session Equipment Utilized During Treatment: Gait belt Activity Tolerance: Patient limited by lethargy Patient left: in bed;with call bell/phone within reach;with nursing/sitter in room;with family/visitor present Nurse Communication: Mobility status PT Visit Diagnosis: Unsteadiness on feet (R26.81);Other abnormalities of gait and mobility (R26.89);Muscle weakness (generalized) (M62.81);Other symptoms and signs involving the nervous system (R29.898);Hemiplegia and hemiparesis Hemiplegia - Right/Left: Left Hemiplegia - dominant/non-dominant: Non-dominant    Time: 2751-7001 PT Time Calculation (min) (ACUTE ONLY): 33 min   Charges:   PT Evaluation $PT Eval Moderate Complexity: 1 Mod PT Treatments $Therapeutic Activity: 8-22 mins  Talbot Grumbling PT, DPT 06/26/20, 3:40 PM

## 2020-06-26 NOTE — ED Triage Notes (Signed)
Pt brought in by EMs due to altered mental status. Pt woke up at 0530 and ate breakfast . Reported that approx 0800 she went unresponsive. By the time EMS arrived she would answer yes to questions. At that time pt couldn't move either legs. Pt has hx of left sided stroke with left sided hemiplegia and left hand contracture. Pt reports taking blood thinner this am

## 2020-06-26 NOTE — Consult Note (Addendum)
[facility] Telemedicine Consult   Requesting Provider: Milton [name]  Consult Participants: Danielle [name], bedside nurse Danielle [name], patient, husband Location of the provider: [facility] Location of the patient: [name]  This consult was provided via telemedicine with 2-way video and audio communication. The patient/family was informed that care would be provided in this way and agreed to receive care in this manner.   Chief Complaint: Unresponsiveness  HPI: Danielle [name] is a 87 y.o. woman with past medical history significant for hypothyroidism, hypertension, hyperlipidemia, insulin-dependent type 2 diabetes, prior strokes (05/03/2018 and 12/23/2018, the latter of which was a right MCA stroke with chronic right carotid occlusion), known left ICA stenosis (60 to 79% currently being medically managed).  At baseline she ambulates with a cane though she still has significant left upper extremity weakness with contracture and spasticity.  She recently completed a course of Eliquis for DVT and remains on Plavix monotherapy for secondary stroke prevention per her husband.   Has been reports she was in her usual state of health until at 5:30 AM she was having her breakfast.  It is not unusual for her to be sleepy afterwards and he initially thought she was just sleeping when he checked on her at 8 AM.  And 9:30 AM when he tried to wake her she was less responsive and coherent than she normally is and additionally had increased weakness than her baseline.  He checked her blood glucose and found to be 82 at home and activated EMS for concern for stroke.  On arrival to the ED she was found to have a blood glucose of 61 treated with 25 g dextrose, with improvement of her blood sugar to 135.  Her neurological exam has been improving with reduced weakness of the right upper extremity and improved responsiveness.  Patient reports she last remembers "going to the left" but is unable to  clarify whether this meant the left side of her body was shaking, she had an involuntary turning movement of her head towards the left, increased left-sided weakness, etc.  LKW: 5:30 AM tPA given?: No, out of the window IA performed?: No, no LVO Premorbid modified rankin scale:      3 - Moderate disability. Requires some help, but able to walk unassisted  ROS: Limited due to patient's confusion.  Time of teleneurologist evaluation: 10:55 AM  There was some delay due to initial page from Danielle [name] not coming through to pager  Exam: Vitals:   06/26/20 1100 06/26/20 1115  BP: (!) 154/62 (!) 164/75  Pulse: 87 89  Resp: 17 20  Temp:    SpO2: 94% 95%    General: Appears comfortable, alert, interactive  1A: Level of Consciousness - 0 1B: Ask Month and Age - 1 --> 0 (initially answered January but was able to correct to February with prompting) 1C: 'Blink Eyes' & 'Squeeze Hands' - 0 2: Test Horizontal Extraocular Movements - 0 3: Test Visual Fields - 0 4: Test Facial Palsy - 2 5A: Test Left Arm Motor Drift - 3 5B: Test Right Arm Motor Drift - 0 6A: Test Left Leg Motor Drift - 3 6B: Test Right Leg Motor Drift - 3 7: Test Limb Ataxia - 0 for the right upper extremity, unable to test in the other extremities 8: Test Sensation - 0 9: Test Language/Aphasia- 1 -had some difficulty repeating (stated "no ands ifs or but" instead of "no ifs, ands or buts").  Additionally she reading glasses to correctly identify hammock  and cactus 10: Test Dysarthria -0 11: Test Extinction/Inattention -0 NIHSS score: 13; however most of these deficits appear chronic   Imaging personally reviewed:  Head CT without acute intracranial abnormality, chronic right MCA territory stroke and left frontal parafalcine meningioma CTA of the head with a new left ACA (A3 segment) occlusion with distal reconstitution matching her perfusion deficit on CT perfusion  Labs reviewed in epic and pertinent values follow: Cr  0.8, glucose 62 as mentioned above, normal CBC other than elevated RDW of 18.5  EEG 12/25/2018 This study is suggestive of cortical dysfunction in right frontotemporal region as well as mild cortical dysfunction in left temporal region. No seizures or epileptiform discharges were seen throughout the recording.  However, only wakefulness and drowsiness were recorded. If suspicion for interictal activity remains a concern, a prolonged study including sleep should be considered.   Assessment: New left ACA territory stroke in this patient with vascular risk factors as detailed above.  Unfortunately outside of the tPA window and blockage is too small for thrombectomy.  Will need admission for stroke work-up and consideration of left internal carotid artery intervention by vascular surgery given the stenosis is now likely symptomatic though cannot rule out cardioembolic or aortic arch atheroembolic source.  Recommendations:  #Likely atheroembolic left ACA stroke - Stroke labs HgbA1c, fasting lipid panel - MRI brain  - Frequent neuro checks - Echocardiogram - Continue home Plavix monotherapy unless indication for anticoagulation found, pending workup consider addition of aspirin for a 90 day course - Risk factor modification - Telemetry monitoring; 30 day event monitor on discharge if no arrythmias captured  - Blood pressure goal   - Permissive hypertension to 220/120 due to occlusion that cannot be intervened upon - PT consult, OT consult, Speech consult - Inpatient neurology consult  This patient is receiving care for possible acute neurological changes. There was 60 minutes of care by this provider at the time of service, including time for direct evaluation via telemedicine, review of medical records, imaging studies and discussion of findings with providers, the patient and/or family.  Recommendations conveyed to ED provider via secure chat  Addendum: On review of MRI brain.  No acute infarct.   Area of perfusion abnormality on CTA likely secondary to meningioma.  Seizure may be considered in the differential for this patient as the meningioma could be a seizure focus, though I will defer to Dr. Merlene Laughter as he will be evaluating the patient in person.  Additionally she may simply have had symptomatic hypoglycemia or a seizure triggered due to hypoglycemia   Lesleigh Noe MD-PhD [facility] [phone]

## 2020-06-26 NOTE — ED Notes (Addendum)
Pt taken to CT on EMS stretcher after Dr. Roderic Palau cleared pt and called Code Stroke.

## 2020-06-26 NOTE — ED Provider Notes (Signed)
[facility] EMERGENCY DEPARTMENT Provider Note   CSN: [id number] Arrival date & time: 06/26/20  1020  An emergency department physician performed an initial assessment on this suspected stroke patient at 1024.  History Chief Complaint  Patient presents with  . Code Stroke    Danielle [name] is a 87 y.o. female.  Patient presents with weakness to her right side and some confusion.  She has had a stroke before on her left side  The history is provided by a relative.  Weakness Severity:  Mild Onset quality:  Sudden Timing:  Constant Progression:  Waxing and waning Chronicity:  New Context: not alcohol use   Relieved by:  Nothing Ineffective treatments:  None tried Associated symptoms: no abdominal pain        Past Medical History:  Diagnosis Date  . Arthritis    "some joint pain once in awhile" (05/26/2017)  . Back pain at L4-L5 level 06/14/2013  . Cataract   . CVA (cerebral vascular accident) ([place]) 2019  . Encounter for screening mammogram for malignant neoplasm of breast 04/19/2019  . Generalized weakness 06/09/2018  . Heart murmur   . History of kidney stones   . Hyperlipemia   . Hypertension   . Hypothyroidism (acquired)   . Muscle weakness (generalized) 12/17/2012  . Osteoporosis   . Pain in joint, shoulder region 12/17/2012  . Pneumothorax 02/05/2017   right-after fall  . Proximal humerus fracture 11/29/2012  . Rib fractures 02/05/2017   right side  . 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 Shoulder fracture 12/14/2012  . Stroke ([place])   . Stroke-like symptoms 12/23/2018  . Type II diabetes mellitus [facility])     Patient Active Problem List   Diagnosis Date Noted  . Acute ischemic stroke ([place]) 06/26/2020  . Carotid artery occlusion with infarction ([place]) 04/10/2020  . Fracture of left wrist with routine healing 03/15/2020  . Closed fracture of left pubis with routine healing 03/15/2020  . Leg swelling 02/15/2020  . Hospital discharge follow-up  02/15/2020  . Other fracture of upper end of left radius, closed fracture--transverse fracture in the distal radial metaphysis 12/31/2019  . Closed fracture of multiple pubic rami, left, initial encounter ([place]) 12/29/2019  . Fall at home, initial encounter 12/29/2019  . Breast pain, left 12/22/2019  . History of CVA with residual deficit 11/10/2019  . Osteoporosis 04/19/2019  . Hyperlipidemia 04/19/2019  . Carotid stenosis, bilateral 12/30/2018  . Hypothyroidism (acquired) 05/03/2018  . Hypertension 05/03/2018  . Uncontrolled type 2 diabetes mellitus with diabetic nephropathy, with long-term current use of insulin [facility]     Past Surgical History:  Procedure Laterality Date  . AUGMENTATION MAMMAPLASTY Bilateral   . COSMETIC SURGERY    . DILATION AND CURETTAGE OF UTERUS    . EYE SURGERY    . FRACTURE SURGERY    . JOINT REPLACEMENT    . REVERSE SHOULDER ARTHROPLASTY Right 05/26/2017  . REVERSE SHOULDER ARTHROPLASTY Right 05/26/2017   Procedure: REVERSE RIGHT SHOULDER ARTHROPLASTY;  Surgeon: Meredith Pel, MD;  Location: [place];  Service: Orthopedics;  Laterality: Right;  . TONSILLECTOMY    . TUBAL LIGATION       OB History    Gravida      Para      Term      Preterm      AB      Living  2     SAB      IAB  Ectopic      Multiple      Live Births              Family History  Problem Relation Age of Onset  . Cancer Mother   . Heart disease Father     Social History   Tobacco Use  . Smoking status: Former Smoker    Packs/day: 1.00    Years: 40.00    Pack years: 40.00    Types: Cigarettes    Quit date: 1991    Years since quitting: 31.1  . Smokeless tobacco: Never Used  Vaping Use  . Vaping Use: Never used  Substance Use Topics  . Alcohol use: No  . Drug use: No    Home Medications Prior to Admission medications   Medication Sig Start Date End Date Taking? Authorizing Provider  acetaminophen (TYLENOL) 325 MG tablet Take 2 tablets  (650 mg total) by mouth every 6 (six) hours as needed for mild pain (or Fever >/= 101). 01/20/19   Angiulli, Lavon Paganini, PA-C  amLODipine (NORVASC) 5 MG tablet TAKE 1 TABLET BY MOUTH EVERY DAY 06/11/20   Perlie Mayo, NP  atorvastatin (LIPITOR) 80 MG tablet Take 1 tablet (80 mg total) by mouth daily at 6 PM. 05/08/20   Perlie Mayo, NP  Calcium Carb-Cholecalciferol (CALCIUM 600 + D PO) Take 1 tablet by mouth daily.    [provider]  clopidogrel (PLAVIX) 75 MG tablet TAKE 1 TABLET BY MOUTH EVERY DAY 08/15/19   Corum, Rex Kras, MD  diclofenac sodium (VOLTAREN) 1 % GEL Apply 2 g topically 4 (four) times daily. 01/20/19   Angiulli, Lavon Paganini, PA-C  glucose blood test strip Use as instructed check blood sugar three times daily as needed for machine pt already has 04/25/20   Perlie Mayo, NP  insulin glargine, 2 Unit Dial, (TOUJEO MAX SOLOSTAR) 300 UNIT/ML Solostar Pen Inject 32 Units into the skin daily before breakfast. 01/02/20   Emokpae, Courage, MD  Insulin Lispro (HUMALOG KWIKPEN) 200 UNIT/ML SOPN Inject 12 Units into the skin in the morning, at noon, and at bedtime. 10-12 units BID     [provider]  levothyroxine (SYNTHROID) 100 MCG tablet TAKE 1 TABLET BY MOUTH EVERY DAY IN THE MORNING 05/29/20   Perlie Mayo, NP  methocarbamol (ROBAXIN) 500 MG tablet Take 1 tablet (500 mg total) by mouth 3 (three) times daily. 01/02/20   Roxan Hockey, MD  Multiple Vitamins-Minerals (OCUVITE ADULT 50+ PO) Take 1 tablet by mouth daily.     [provider]  oxyCODONE (OXY IR/ROXICODONE) 5 MG immediate release tablet Take 1 tablet (5 mg total) by mouth every 4 (four) hours as needed for moderate pain or severe pain. 01/02/20   Roxan Hockey, MD  senna-docusate (SENOKOT-S) 8.6-50 MG tablet Take 2 tablets by mouth 2 (two) times daily. 01/02/20   Roxan Hockey, MD  vitamin B-12 (CYANOCOBALAMIN) 1000 MCG tablet Take 1 tablet (1,000 mcg total) by mouth daily. 01/20/19   Angiulli, Lavon Paganini,  PA-C    Allergies    Fosamax [alendronate sodium] and Prednisone  Review of Systems   Review of Systems  Unable to perform ROS: Mental status change  Gastrointestinal: Negative for abdominal pain.  Neurological: Positive for weakness.    Physical Exam Updated Vital Signs BP (!) 135/53   Pulse 93   Temp 99.3 F (37.4 C) (Oral)   Resp 14   Ht 5\' 3"  (1.6 m)   Wt 68.6 kg  SpO2 94%   BMI 26.80 kg/m   Physical Exam Vitals and nursing note reviewed.  Constitutional:      Appearance: She is well-developed.     Comments: Mildly lethargic  HENT:     Head: Normocephalic.     Nose: Nose normal.  Eyes:     General: No scleral icterus.    Extraocular Movements: EOM normal.     Conjunctiva/sclera: Conjunctivae normal.  Neck:     Thyroid: No thyromegaly.  Cardiovascular:     Rate and Rhythm: Normal rate and regular rhythm.     Heart sounds: No murmur heard. No friction rub. No gallop.   Pulmonary:     Breath sounds: No stridor. No wheezing or rales.  Chest:     Chest wall: No tenderness.  Abdominal:     General: There is no distension.     Tenderness: There is no abdominal tenderness. There is no rebound.  Musculoskeletal:        General: No edema.     Cervical back: Neck supple.     Comments: Significant weakness in right arm and right leg which is new. She also has some weakness in her left leg which is old  Lymphadenopathy:     Cervical: No cervical adenopathy.  Skin:    Coloration: Skin is not jaundiced.     Findings: No erythema or rash.  Neurological:     Motor: No abnormal muscle tone.     Coordination: Coordination normal.  Psychiatric:        Mood and Affect: Mood and affect normal.     ED Results / Procedures / Treatments   Labs (all labs ordered are listed, but only abnormal results are displayed) Labs Reviewed  CBC - Abnormal; Notable for the following components:      Result Value   RDW 18.5 (*)    All other components within normal limits   COMPREHENSIVE METABOLIC PANEL - Abnormal; Notable for the following components:   Glucose, Bld 62 (*)    BUN 24 (*)    AST 46 (*)    All other components within normal limits  CBG MONITORING, ED - Abnormal; Notable for the following components:   Glucose-Capillary 61 (*)    All other components within normal limits  CBG MONITORING, ED - Abnormal; Notable for the following components:   Glucose-Capillary 135 (*)    All other components within normal limits  RESP PANEL BY RT-PCR (FLU A&B, COVID) ARPGX2  ETHANOL  PROTIME-INR  APTT  DIFFERENTIAL  RAPID URINE DRUG SCREEN, HOSP PERFORMED  URINALYSIS, ROUTINE W REFLEX MICROSCOPIC  TSH  VITAMIN B12  I-STAT CHEM 8, ED    EKG None  Radiology CT Code Stroke CTA Head W/WO contrast  Result Date: 06/26/2020 CLINICAL DATA:  Neuro deficit, acute, stroke suspected EXAM: CT ANGIOGRAPHY HEAD AND NECK TECHNIQUE: Multidetector CT imaging of the head and neck was performed using the standard protocol during bolus administration of intravenous contrast. Multiplanar CT image reconstructions and MIPs were obtained to evaluate the vascular anatomy. Carotid stenosis measurements (when applicable) are obtained utilizing NASCET criteria, using the distal internal carotid diameter as the denominator. CONTRAST:  13mL OMNIPAQUE IOHEXOL 350 MG/ML SOLN COMPARISON:  06/26/2020 and prior. FINDINGS: CTA NECK FINDINGS Aortic arch: Standard branching. Mild atherosclerotic calcifications. Patent great vessel origins. Right carotid system: Patent CCA. Distal CCA atherosclerotic calcifications. Severe bifurcation atheromatous disease with chronic occlusion of the ICA origin. Prior thin linear distal reconstitution is not demonstrated. Mild to moderate  narrowing of the proximal ECA. Left carotid system: Patent CCA. Carotid bifurcation atheromatous disease with at least 80-90% narrowing of the bulb and ICA origin. Distally patent cervical ICA demonstrates normal caliber.  Vertebral arteries: Dominant left vertebral artery. Patent. Chronic mild narrowing of the left vertebral artery at the C3 level secondary to adjacent osteophytes. Skeleton: No acute finding. Multilevel spondylosis. Grade 1 C4-5 anterolisthesis. Partial fusion at the C5-6 level. Right shoulder arthroplasty. Chronic right clavicle deformity. Other neck: No adenopathy or mass. Upper chest: No acute finding. Review of the MIP images confirms the above findings CTA HEAD FINDINGS Anterior circulation: Chronic right ICA occlusion to the level of the terminus. Diminutive appearance of the right MCA and ACA however patent. Patent normal caliber left ICA. Patent left MCA with mild M1 segment narrowing. Patent left A1 segment. Large vessel occlusion involving the distal left A2/proximal A3 segment. Posterior circulation: Patent V4 segments. Patent basilar and superior cerebellar arteries. Patent right PCA. Mild narrowing and irregularity of the right PCA origin and P2/P3 segments may reflect atheromatous disease. Fetal origin of the left PCA, patent. Venous sinuses: Non opacification of the left sigmoid sinus and proximal internal jugular vein may reflect mixing artifact versus chronic thrombosis. Anatomic variants: Please see above. Review of the MIP images confirms the above findings IMPRESSION: Large vessel occlusion involving the left A2/A3 segment. Chronic right ICA occlusion to the level of the terminus. Diminutive right ACA/MCA. Mild left M1 segment and right PCA narrowing. High-grade narrowing of the left carotid bulb/ICA origin with distal reconstitution, grossly unchanged. Asymmetric non opacification of the left sigmoid sinus/proximal IJ may reflect mixing artifact versus chronic thrombosis. These results were called by telephone at the time of interpretation on 06/26/2020 at 12:06 pm to provider [facility] , who verbally acknowledged these results. Electronically Signed   By: Primitivo Gauze M.D.   On:  06/26/2020 12:29   CT Code Stroke CTA Neck W/WO contrast  Result Date: 06/26/2020 CLINICAL DATA:  Neuro deficit, acute, stroke suspected EXAM: CT ANGIOGRAPHY HEAD AND NECK TECHNIQUE: Multidetector CT imaging of the head and neck was performed using the standard protocol during bolus administration of intravenous contrast. Multiplanar CT image reconstructions and MIPs were obtained to evaluate the vascular anatomy. Carotid stenosis measurements (when applicable) are obtained utilizing NASCET criteria, using the distal internal carotid diameter as the denominator. CONTRAST:  110mL OMNIPAQUE IOHEXOL 350 MG/ML SOLN COMPARISON:  06/26/2020 and prior. FINDINGS: CTA NECK FINDINGS Aortic arch: Standard branching. Mild atherosclerotic calcifications. Patent great vessel origins. Right carotid system: Patent CCA. Distal CCA atherosclerotic calcifications. Severe bifurcation atheromatous disease with chronic occlusion of the ICA origin. Prior thin linear distal reconstitution is not demonstrated. Mild to moderate narrowing of the proximal ECA. Left carotid system: Patent CCA. Carotid bifurcation atheromatous disease with at least 80-90% narrowing of the bulb and ICA origin. Distally patent cervical ICA demonstrates normal caliber. Vertebral arteries: Dominant left vertebral artery. Patent. Chronic mild narrowing of the left vertebral artery at the C3 level secondary to adjacent osteophytes. Skeleton: No acute finding. Multilevel spondylosis. Grade 1 C4-5 anterolisthesis. Partial fusion at the C5-6 level. Right shoulder arthroplasty. Chronic right clavicle deformity. Other neck: No adenopathy or mass. Upper chest: No acute finding. Review of the MIP images confirms the above findings CTA HEAD FINDINGS Anterior circulation: Chronic right ICA occlusion to the level of the terminus. Diminutive appearance of the right MCA and ACA however patent. Patent normal caliber left ICA. Patent left MCA with mild M1 segment narrowing.  Patent  left A1 segment. Large vessel occlusion involving the distal left A2/proximal A3 segment. Posterior circulation: Patent V4 segments. Patent basilar and superior cerebellar arteries. Patent right PCA. Mild narrowing and irregularity of the right PCA origin and P2/P3 segments may reflect atheromatous disease. Fetal origin of the left PCA, patent. Venous sinuses: Non opacification of the left sigmoid sinus and proximal internal jugular vein may reflect mixing artifact versus chronic thrombosis. Anatomic variants: Please see above. Review of the MIP images confirms the above findings IMPRESSION: Large vessel occlusion involving the left A2/A3 segment. Chronic right ICA occlusion to the level of the terminus. Diminutive right ACA/MCA. Mild left M1 segment and right PCA narrowing. High-grade narrowing of the left carotid bulb/ICA origin with distal reconstitution, grossly unchanged. Asymmetric non opacification of the left sigmoid sinus/proximal IJ may reflect mixing artifact versus chronic thrombosis. These results were called by telephone at the time of interpretation on 06/26/2020 at 12:06 pm to provider [facility] , who verbally acknowledged these results. Electronically Signed   By: Primitivo Gauze M.D.   On: 06/26/2020 12:29   CT Code Stroke Cerebral Perfusion with contrast  Result Date: 06/26/2020 CLINICAL DATA:  Neuro deficit, acute, stroke suspected EXAM: CT PERFUSION BRAIN TECHNIQUE: Multiphase CT imaging of the brain was performed following IV bolus contrast injection. Subsequent parametric perfusion maps were calculated using RAPID software. CONTRAST:  119mL OMNIPAQUE IOHEXOL 350 MG/ML SOLN COMPARISON:  06/26/2020 and prior. FINDINGS: CT Brain Perfusion Findings: CBF (<30%) Volume: 73mL Perfusion (Tmax>6.0s) volume: 43mL Mismatch Volume: 39mL ASPECTS on noncontrast CT Head: 10 at 10:40 a.m. today. Infarct Core: 9 mL Infarction Location:Bilateral frontal lobes. IMPRESSION: 9 mL infarct core  involving the bifrontal region. Electronically Signed   By: Primitivo Gauze M.D.   On: 06/26/2020 11:56   CT HEAD CODE STROKE WO CONTRAST`  Result Date: 06/26/2020 CLINICAL DATA:  Code stroke. Decreased level of consciousness, unable to move both legs EXAM: CT HEAD WITHOUT CONTRAST TECHNIQUE: Contiguous axial images were obtained from the base of the skull through the vertex without intravenous contrast. COMPARISON:  MRI and CT from 2020 FINDINGS: Brain: There is no acute intracranial hemorrhage. No acute appearing loss of gray-white differentiation. There are chronic infarcts of the right MCA territory involving frontoparietal lobes and insula. Ex vacuo dilatation of the adjacent right lateral ventricle. Additional areas of hypoattenuation in the supratentorial white matter are nonspecific but probably reflect stable chronic microvascular ischemic changes. Hyperdense dural-based mass along the left aspect of the anterior falx is again identified is most consistent with a meningioma. Size is similar and there is no underlying edema. Vascular: No hyperdense vessel. There is intracranial atherosclerotic calcification at the skull base. Skull: Unremarkable. Sinuses/Orbits: No acute abnormality Other: Mastoid air cells are clear. ASPECTS ([place] Stroke Program Early CT Score) - Ganglionic level infarction (caudate, lentiform nuclei, internal capsule, insula, M1-M3 cortex): 7 - Supraganglionic infarction (M4-M6 cortex): 3 Total score (0-10 with 10 being normal): 10 IMPRESSION: No acute intracranial hemorrhage or evidence of acute infarction. Chronic right MCA territory infarcts. Similar meningioma along the falx. These results were called by telephone at the time of interpretation on 06/26/2020 at 10:40 am to provider [facility] Khizar Fiorella , who verbally acknowledged these results. Electronically Signed   By: Macy Mis M.D.   On: 06/26/2020 10:44    Procedures Procedures   Medications Ordered in ED Medications   heparin injection 5,000 Units (has no administration in time range)  clopidogrel (PLAVIX) tablet 75 mg (has no administration in time range)  stroke: mapping our early stages of recovery book (has no administration in time range)  0.9 %  sodium chloride infusion (has no administration in time range)  acetaminophen (TYLENOL) tablet 650 mg (has no administration in time range)    Or  acetaminophen (TYLENOL) 160 MG/5ML solution 650 mg (has no administration in time range)    Or  acetaminophen (TYLENOL) suppository 650 mg (has no administration in time range)  senna-docusate (Senokot-S) tablet 1 tablet (has no administration in time range)  dextrose 50 % solution (50 mLs  Given 06/26/20 1041)  iohexol (OMNIPAQUE) 350 MG/ML injection 100 mL (100 mLs Intravenous Contrast Given 06/26/20 1131)   CRITICAL CARE Performed by: Milton Ferguson Total critical care time: 45 minutes Critical care time was exclusive of separately billable procedures and treating other patients. Critical care was necessary to treat or prevent imminent or life-threatening deterioration. Critical care was time spent personally by me on the following activities: development of treatment plan with patient and/or surrogate as well as nursing, discussions with consultants, evaluation of patient's response to treatment, examination of patient, obtaining history from patient or surrogate, ordering and performing treatments and interventions, ordering and review of laboratory studies, ordering and review of radiographic studies, pulse oximetry and re-evaluation of patient's condition.  ED Course  I have reviewed the triage vital signs and the nursing notes.  Pertinent labs & imaging results that were available during my care of the patient were reviewed by me and considered in my medical decision making (see chart for details).    MDM Rules/Calculators/A&P                          Patient with improving weakness to right arm and leg.  Patient was seen by neurology and will be admitted for stroke work-up. MRI pending CT angio of the head shows vessel occlusion involving the left A2/A3 segment. Patient will be admitted to medicine for further work-up Final Clinical Impression(s) / ED Diagnoses Final diagnoses:  None    Rx / DC Orders ED Discharge Orders    None       Milton Ferguson, MD 06/26/20 1320

## 2020-06-26 NOTE — Plan of Care (Signed)
  Problem: Acute Rehab PT Goals(only PT should resolve) Goal: Pt Will Go Supine/Side To Sit Outcome: Progressing Flowsheets (Taken 06/26/2020 1546) Pt will go Supine/Side to Sit: with min guard assist Goal: Pt Will Go Sit To Supine/Side Outcome: Progressing Flowsheets (Taken 06/26/2020 1546) Pt will go Sit to Supine/Side: with min guard assist Goal: Patient Will Transfer Sit To/From Stand Outcome: Progressing Flowsheets (Taken 06/26/2020 1546) Patient will transfer sit to/from stand: with minimal assist Goal: Pt Will Transfer Bed To Chair/Chair To Bed Outcome: Progressing Flowsheets (Taken 06/26/2020 1546) Pt will Transfer Bed to Chair/Chair to Bed: with min assist Goal: Pt Will Ambulate Outcome: Progressing Flowsheets (Taken 06/26/2020 1546) Pt will Ambulate:  100 feet  with minimal assist  with cane   Tori Koriana Stepien PT, DPT 06/26/20, 3:47 PM

## 2020-06-26 NOTE — ED Notes (Signed)
Pt taken to MRI at this time

## 2020-06-26 NOTE — ED Notes (Signed)
Pt back from MRI and echo is at bedside at this time.

## 2020-06-26 NOTE — Progress Notes (Signed)
Rehab Admissions Coordinator Note:  Patient was screened by Cleatrice Burke for appropriateness for an Inpatient Acute Rehab Consult per PT recommendation. I await medical workup for medical neccesity before pursuing rehab consult. I will follow up.   Cleatrice Burke RN MSN 06/26/2020, 3:55 PM  I can be reached at [phone].

## 2020-06-26 NOTE — Progress Notes (Signed)
*  PRELIMINARY RESULTS* Echocardiogram 2D Echocardiogram has been performed.  Danielle [name] 06/26/2020, 2:12 PM

## 2020-06-26 NOTE — H&P (Signed)
History and Physical    Danielle [name] RRN:[id number] DOB: March 18, 1932 DOA: 06/26/2020  PCP: Perlie Mayo, NP   Patient coming from: Home  I have personally briefly reviewed patient's old medical records in [place]  Chief Complaint: Right-sided weakness, confusion, code stroke.  HPI: Danielle [name] is a 87 y.o. female with medical history significant of hypertension, hyperlipidemia, prior history of stroke with left residual deficit, history of meningioma, type 2 diabetes mellitus and hypothyroidism; who presented to the hospital secondary to acute right-sided weakness and confusion. Patient was last seen normal around 5:30 in the morning. There was no chest pain, no nausea, no vomiting, no dysuria, no hematuria, no fever, no chills, no sick contacts.  Vaccinated against Covid with Toma Aran in March 2021 Respiratory panel at time of admission negative for Covid.  ED Course: Patient presented as a code stroke; CT head and CT angiogram with concerns for new left ACA ischemic stroke. Patient was out window for TPA; discussion with neurology lead for hospitalist placement to complete a stroke work-up. Patient was found with positive hypoglycemia CBGs of 62 on presentation. The rest of her blood work was unremarkable. D50 amp given and after passing swallowing evaluation diet advanced.  Review of Systems: As per HPI otherwise all other systems reviewed and are negative.  Past Medical History:  Diagnosis Date  . Arthritis    "some joint pain once in awhile" (05/26/2017)  . Back pain at L4-L5 level 06/14/2013  . Cataract   . CVA (cerebral vascular accident) ([place]) 2019  . Encounter for screening mammogram for malignant neoplasm of breast 04/19/2019  . Generalized weakness 06/09/2018  . Heart murmur   . History of kidney stones   . Hyperlipemia   . Hypertension   . Hypothyroidism (acquired)   . Muscle weakness (generalized) 12/17/2012  . Osteoporosis   . Pain in  joint, shoulder region 12/17/2012  . Pneumothorax 02/05/2017   right-after fall  . Proximal humerus fracture 11/29/2012  . Rib fractures 02/05/2017   right side  . 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 Shoulder fracture 12/14/2012  . Stroke ([place])   . Stroke-like symptoms 12/23/2018  . Type II diabetes mellitus ([place])     Past Surgical History:  Procedure Laterality Date  . AUGMENTATION MAMMAPLASTY Bilateral   . COSMETIC SURGERY    . DILATION AND CURETTAGE OF UTERUS    . EYE SURGERY    . FRACTURE SURGERY    . JOINT REPLACEMENT    . REVERSE SHOULDER ARTHROPLASTY Right 05/26/2017  . REVERSE SHOULDER ARTHROPLASTY Right 05/26/2017   Procedure: REVERSE RIGHT SHOULDER ARTHROPLASTY;  Surgeon: Meredith Pel, MD;  Location: [facility];  Service: Orthopedics;  Laterality: Right;  . TONSILLECTOMY    . TUBAL LIGATION      Social History  reports that she quit smoking about 31 years ago. Her smoking use included cigarettes. She has a 40.00 pack-year smoking history. She has never used smokeless tobacco. She reports that she does not drink alcohol and does not use drugs.  Allergies  Allergen Reactions  . Fosamax [Alendronate Sodium] Other (See Comments)    MUSCLE ACHES  . Prednisone     Feels jittery and nauseous    Family History  Problem Relation Age of Onset  . Cancer Mother   . Heart disease Father     Prior to Admission medications   Medication Sig Start Date End Date Taking? Authorizing Provider  acetaminophen (TYLENOL) 325  MG tablet Take 2 tablets (650 mg total) by mouth every 6 (six) hours as needed for mild pain (or Fever >/= 101). 01/20/19   Angiulli, Lavon Paganini, PA-C  amLODipine (NORVASC) 5 MG tablet TAKE 1 TABLET BY MOUTH EVERY DAY 06/11/20   Perlie Mayo, NP  atorvastatin (LIPITOR) 80 MG tablet Take 1 tablet (80 mg total) by mouth daily at 6 PM. 05/08/20   Perlie Mayo, NP  Calcium Carb-Cholecalciferol (CALCIUM 600 + D PO) Take 1 tablet by mouth daily.     [provider]  clopidogrel (PLAVIX) 75 MG tablet TAKE 1 TABLET BY MOUTH EVERY DAY 08/15/19   Corum, Rex Kras, MD  diclofenac sodium (VOLTAREN) 1 % GEL Apply 2 g topically 4 (four) times daily. 01/20/19   Angiulli, Lavon Paganini, PA-C  glucose blood test strip Use as instructed check blood sugar three times daily as needed for machine pt already has 04/25/20   Perlie Mayo, NP  insulin glargine, 2 Unit Dial, (TOUJEO MAX SOLOSTAR) 300 UNIT/ML Solostar Pen Inject 32 Units into the skin daily before breakfast. 01/02/20   Emokpae, Courage, MD  Insulin Lispro (HUMALOG KWIKPEN) 200 UNIT/ML SOPN Inject 12 Units into the skin in the morning, at noon, and at bedtime. 10-12 units BID     [provider]  levothyroxine (SYNTHROID) 100 MCG tablet TAKE 1 TABLET BY MOUTH EVERY DAY IN THE MORNING 05/29/20   Perlie Mayo, NP  methocarbamol (ROBAXIN) 500 MG tablet Take 1 tablet (500 mg total) by mouth 3 (three) times daily. 01/02/20   Roxan Hockey, MD  Multiple Vitamins-Minerals (OCUVITE ADULT 50+ PO) Take 1 tablet by mouth daily.     [provider]  oxyCODONE (OXY IR/ROXICODONE) 5 MG immediate release tablet Take 1 tablet (5 mg total) by mouth every 4 (four) hours as needed for moderate pain or severe pain. 01/02/20   Roxan Hockey, MD  senna-docusate (SENOKOT-S) 8.6-50 MG tablet Take 2 tablets by mouth 2 (two) times daily. 01/02/20   Roxan Hockey, MD  vitamin B-12 (CYANOCOBALAMIN) 1000 MCG tablet Take 1 tablet (1,000 mcg total) by mouth daily. 01/20/19   Cathlyn Parsons, PA-C    Physical Exam: Vitals:   06/26/20 1407 06/26/20 1440 06/26/20 1644 06/26/20 1824  BP: (!) 153/66 (!) 149/69 (!) 146/63 (!) 166/48  Pulse: 86 88 85 86  Resp: 15 16 17 17   Temp:  98 F (36.7 C) 97.7 F (36.5 C) 97.6 F (36.4 C)  TempSrc:  Oral Oral Oral  SpO2: 94% 100% 98% 98%  Weight:      Height:        Constitutional: Slightly confused but able to follow commands; no chest pain, no nausea, no  vomiting, no fever. Vitals:   06/26/20 1407 06/26/20 1440 06/26/20 1644 06/26/20 1824  BP: (!) 153/66 (!) 149/69 (!) 146/63 (!) 166/48  Pulse: 86 88 85 86  Resp: 15 16 17 17   Temp:  98 F (36.7 C) 97.7 F (36.5 C) 97.6 F (36.4 C)  TempSrc:  Oral Oral Oral  SpO2: 94% 100% 98% 98%  Weight:      Height:       Eyes: PERRL, lids and conjunctivae normal, no icterus, no nystagmus. ENMT: Mucous membranes are moist. Posterior pharynx clear of any exudate or lesions. Neck: normal, supple, no masses, no thyromegaly, no JVD. Respiratory: clear to auscultation bilaterally, no wheezing, no crackles. Normal respiratory effort. No accessory muscle use.  Cardiovascular: Regular rate and rhythm, no rubs,  no gallops, no lower extremity edema. Abdomen: no tenderness, no masses palpated. No hepatosplenomegaly. Bowel sounds positive.  Musculoskeletal: no clubbing / cyanosis. No joint deformity, good range of motion. Skin: No petechiae, no rashes. Neurologic: CN 2-12 grossly intact. Significant weakness in her right arm and right leg (new for her); unchanged left-sided residual weakness from previous stroke. Psychiatric: Normal mood, no agitation or combative behavior. Following commands appropriately.  Labs on Admission: I have personally reviewed following labs and imaging studies  CBC: Recent Labs  Lab 06/26/20 1031  WBC 7.8  NEUTROABS 4.9  HGB 12.0  HCT 36.6  MCV 88.0  PLT 096    Basic Metabolic Panel: Recent Labs  Lab 06/26/20 1031  NA 139  K 3.8  CL 109  CO2 23  GLUCOSE 62*  BUN 24*  CREATININE 0.80  CALCIUM 9.3    GFR: Estimated Creatinine Clearance: 46.9 mL/min (by C-G formula based on SCr of 0.8 mg/dL).  Liver Function Tests: Recent Labs  Lab 06/26/20 1031  AST 46*  ALT 26  ALKPHOS 96  BILITOT 0.7  PROT 6.5  ALBUMIN 3.5    Urine analysis:    Component Value Date/Time   COLORURINE YELLOW 12/29/2019 0419   APPEARANCEUR CLEAR 12/29/2019 0419   LABSPEC >1.030  (H) 12/29/2019 0419   PHURINE 5.5 12/29/2019 0419   GLUCOSEU 250 (A) 12/29/2019 0419   HGBUR NEGATIVE 12/29/2019 0419   BILIRUBINUR NEGATIVE 12/29/2019 0419   KETONESUR NEGATIVE 12/29/2019 0419   PROTEINUR NEGATIVE 12/29/2019 0419   NITRITE NEGATIVE 12/29/2019 0419   LEUKOCYTESUR NEGATIVE 12/29/2019 0419    Radiological Exams on Admission: CT Code Stroke CTA Head W/WO contrast  Result Date: 06/26/2020 CLINICAL DATA:  Neuro deficit, acute, stroke suspected EXAM: CT ANGIOGRAPHY HEAD AND NECK TECHNIQUE: Multidetector CT imaging of the head and neck was performed using the standard protocol during bolus administration of intravenous contrast. Multiplanar CT image reconstructions and MIPs were obtained to evaluate the vascular anatomy. Carotid stenosis measurements (when applicable) are obtained utilizing NASCET criteria, using the distal internal carotid diameter as the denominator. CONTRAST:  144mL OMNIPAQUE IOHEXOL 350 MG/ML SOLN COMPARISON:  06/26/2020 and prior. FINDINGS: CTA NECK FINDINGS Aortic arch: Standard branching. Mild atherosclerotic calcifications. Patent great vessel origins. Right carotid system: Patent CCA. Distal CCA atherosclerotic calcifications. Severe bifurcation atheromatous disease with chronic occlusion of the ICA origin. Prior thin linear distal reconstitution is not demonstrated. Mild to moderate narrowing of the proximal ECA. Left carotid system: Patent CCA. Carotid bifurcation atheromatous disease with at least 80-90% narrowing of the bulb and ICA origin. Distally patent cervical ICA demonstrates normal caliber. Vertebral arteries: Dominant left vertebral artery. Patent. Chronic mild narrowing of the left vertebral artery at the C3 level secondary to adjacent osteophytes. Skeleton: No acute finding. Multilevel spondylosis. Grade 1 C4-5 anterolisthesis. Partial fusion at the C5-6 level. Right shoulder arthroplasty. Chronic right clavicle deformity. Other neck: No adenopathy or  mass. Upper chest: No acute finding. Review of the MIP images confirms the above findings CTA HEAD FINDINGS Anterior circulation: Chronic right ICA occlusion to the level of the terminus. Diminutive appearance of the right MCA and ACA however patent. Patent normal caliber left ICA. Patent left MCA with mild M1 segment narrowing. Patent left A1 segment. Large vessel occlusion involving the distal left A2/proximal A3 segment. Posterior circulation: Patent V4 segments. Patent basilar and superior cerebellar arteries. Patent right PCA. Mild narrowing and irregularity of the right PCA origin and P2/P3 segments may reflect atheromatous disease. Fetal origin of the  left PCA, patent. Venous sinuses: Non opacification of the left sigmoid sinus and proximal internal jugular vein may reflect mixing artifact versus chronic thrombosis. Anatomic variants: Please see above. Review of the MIP images confirms the above findings IMPRESSION: Large vessel occlusion involving the left A2/A3 segment. Chronic right ICA occlusion to the level of the terminus. Diminutive right ACA/MCA. Mild left M1 segment and right PCA narrowing. High-grade narrowing of the left carotid bulb/ICA origin with distal reconstitution, grossly unchanged. Asymmetric non opacification of the left sigmoid sinus/proximal IJ may reflect mixing artifact versus chronic thrombosis. These results were called by telephone at the time of interpretation on 06/26/2020 at 12:06 pm to provider [facility] , who verbally acknowledged these results. Electronically Signed   By: Primitivo Gauze M.D.   On: 06/26/2020 12:29   CT Code Stroke CTA Neck W/WO contrast  Result Date: 06/26/2020 CLINICAL DATA:  Neuro deficit, acute, stroke suspected EXAM: CT ANGIOGRAPHY HEAD AND NECK TECHNIQUE: Multidetector CT imaging of the head and neck was performed using the standard protocol during bolus administration of intravenous contrast. Multiplanar CT image reconstructions and MIPs were  obtained to evaluate the vascular anatomy. Carotid stenosis measurements (when applicable) are obtained utilizing NASCET criteria, using the distal internal carotid diameter as the denominator. CONTRAST:  133mL OMNIPAQUE IOHEXOL 350 MG/ML SOLN COMPARISON:  06/26/2020 and prior. FINDINGS: CTA NECK FINDINGS Aortic arch: Standard branching. Mild atherosclerotic calcifications. Patent great vessel origins. Right carotid system: Patent CCA. Distal CCA atherosclerotic calcifications. Severe bifurcation atheromatous disease with chronic occlusion of the ICA origin. Prior thin linear distal reconstitution is not demonstrated. Mild to moderate narrowing of the proximal ECA. Left carotid system: Patent CCA. Carotid bifurcation atheromatous disease with at least 80-90% narrowing of the bulb and ICA origin. Distally patent cervical ICA demonstrates normal caliber. Vertebral arteries: Dominant left vertebral artery. Patent. Chronic mild narrowing of the left vertebral artery at the C3 level secondary to adjacent osteophytes. Skeleton: No acute finding. Multilevel spondylosis. Grade 1 C4-5 anterolisthesis. Partial fusion at the C5-6 level. Right shoulder arthroplasty. Chronic right clavicle deformity. Other neck: No adenopathy or mass. Upper chest: No acute finding. Review of the MIP images confirms the above findings CTA HEAD FINDINGS Anterior circulation: Chronic right ICA occlusion to the level of the terminus. Diminutive appearance of the right MCA and ACA however patent. Patent normal caliber left ICA. Patent left MCA with mild M1 segment narrowing. Patent left A1 segment. Large vessel occlusion involving the distal left A2/proximal A3 segment. Posterior circulation: Patent V4 segments. Patent basilar and superior cerebellar arteries. Patent right PCA. Mild narrowing and irregularity of the right PCA origin and P2/P3 segments may reflect atheromatous disease. Fetal origin of the left PCA, patent. Venous sinuses: Non  opacification of the left sigmoid sinus and proximal internal jugular vein may reflect mixing artifact versus chronic thrombosis. Anatomic variants: Please see above. Review of the MIP images confirms the above findings IMPRESSION: Large vessel occlusion involving the left A2/A3 segment. Chronic right ICA occlusion to the level of the terminus. Diminutive right ACA/MCA. Mild left M1 segment and right PCA narrowing. High-grade narrowing of the left carotid bulb/ICA origin with distal reconstitution, grossly unchanged. Asymmetric non opacification of the left sigmoid sinus/proximal IJ may reflect mixing artifact versus chronic thrombosis. These results were called by telephone at the time of interpretation on 06/26/2020 at 12:06 pm to provider [facility] , who verbally acknowledged these results. Electronically Signed   By: Primitivo Gauze M.D.   On: 06/26/2020 12:29  CT Code Stroke Cerebral Perfusion with contrast  Result Date: 06/26/2020 CLINICAL DATA:  Neuro deficit, acute, stroke suspected EXAM: CT PERFUSION BRAIN TECHNIQUE: Multiphase CT imaging of the brain was performed following IV bolus contrast injection. Subsequent parametric perfusion maps were calculated using RAPID software. CONTRAST:  185mL OMNIPAQUE IOHEXOL 350 MG/ML SOLN COMPARISON:  06/26/2020 and prior. FINDINGS: CT Brain Perfusion Findings: CBF (<30%) Volume: 55mL Perfusion (Tmax>6.0s) volume: 48mL Mismatch Volume: 23mL ASPECTS on noncontrast CT Head: 10 at 10:40 a.m. today. Infarct Core: 9 mL Infarction Location:Bilateral frontal lobes. IMPRESSION: 9 mL infarct core involving the bifrontal region. Electronically Signed   By: Primitivo Gauze M.D.   On: 06/26/2020 11:56    CT HEAD CODE STROKE WO CONTRAST`  Result Date: 06/26/2020 CLINICAL DATA:  Code stroke. Decreased level of consciousness, unable to move both legs EXAM: CT HEAD WITHOUT CONTRAST TECHNIQUE: Contiguous axial images were obtained from the base of the skull through the  vertex without intravenous contrast. COMPARISON:  MRI and CT from 2020 FINDINGS: Brain: There is no acute intracranial hemorrhage. No acute appearing loss of gray-white differentiation. There are chronic infarcts of the right MCA territory involving frontoparietal lobes and insula. Ex vacuo dilatation of the adjacent right lateral ventricle. Additional areas of hypoattenuation in the supratentorial white matter are nonspecific but probably reflect stable chronic microvascular ischemic changes. Hyperdense dural-based mass along the left aspect of the anterior falx is again identified is most consistent with a meningioma. Size is similar and there is no underlying edema. Vascular: No hyperdense vessel. There is intracranial atherosclerotic calcification at the skull base. Skull: Unremarkable. Sinuses/Orbits: No acute abnormality Other: Mastoid air cells are clear. ASPECTS ([place] Stroke Program Early CT Score) - Ganglionic level infarction (caudate, lentiform nuclei, internal capsule, insula, M1-M3 cortex): 7 - Supraganglionic infarction (M4-M6 cortex): 3 Total score (0-10 with 10 being normal): 10 IMPRESSION: No acute intracranial hemorrhage or evidence of acute infarction. Chronic right MCA territory infarcts. Similar meningioma along the falx. These results were called by telephone at the time of interpretation on 06/26/2020 at 10:40 am to provider [facility] , who verbally acknowledged these results. Electronically Signed   By: Macy Mis M.D.   On: 06/26/2020 10:44    EKG: No acute ischemic changes appreciated. Normal QT. Sinus rhythm.  Assessment/Plan 1-stroke work up: with concerns for ischemic stroke -Patient will be admitted for MRI, 2D echo, lipid panel and A1C -PT, OT, SPL evaluation and formal consultation with in house neurology -will be permissive with HTN in the setting of ischemia. -close neurologic exams as per stroke protocol -Continue Plavix for secondary prevention -Telemetry  monitoring.  2-type 2 diabetes with hyperglycemia on presentation -We will decrease patient insulin therapy -Allow modified carbohydrate diet once swallowing evaluation completed -Maintain adequate hydration -Follow CBGs closely.  3-essential hypertension -As mentioned above will allow for permissive hypertension -Follow blood pressure.  4-hyperlipidemia -Continue Lipitor -Check lipid panel.  5-hypothyroidism -Will check TSH -Continue Synthroid.  6-history of B12 deficiency -Will check B12 level -Continue oral supplementation.   DVT prophylaxis: Heparin. Code Status:   Full Code Family Communication:  No family at bedside. Disposition Plan:   Patient is from:  Home  Anticipated DC to:  To be determined.  Anticipated DC date:  06/27/20--06/28/20  Anticipated DC barriers: Stroke work-up completion discussion with vascular surgeons regarding need for acute intervention on left carotid abnormalities.  Consults called:  Neurology service (Dr. Merlene Laughter). Admission status:  Observation, telemetry, length of stay less than 2 midnights.  Severity of Illness: Moderate illness; patient presenting with acute/sudden right-sided weakness and confusion. Acute images demonstrating concerns for acute ischemic stroke affecting her left ACA. Out of window for TPA. Seen by teleneurology recommended admission to complete a stroke work-up and have formal evaluation by inpatient neurology.   Barton Dubois MD [facility]  How to contact the [facility] Attending or Consulting provider [place] or covering provider during after hours [place], for this patient?   1. Check the care team in [facility] and look for a) attending/consulting [facility] provider listed and b) the [facility] team listed 2. Log into [email or web address] and use [place]'s universal password to access. If you do not have the password, please contact the hospital operator. 3. Locate the [facility] provider you are looking for under [facility] and page  to a number that you can be directly reached. 4. If you still have difficulty reaching the provider, please page the [facility] (Director on Call) for the Hospitalists listed on amion for assistance.  06/26/2020, 6:43 PM

## 2020-06-26 NOTE — Progress Notes (Signed)
Code Stroke CT times 1010 call 1017 beeper 1025 exam started 1030 exam finished 1030 images sent to [facility] 1031 exam completed in [facility] called

## 2020-06-27 ENCOUNTER — Inpatient Hospital Stay (HOSPITAL_COMMUNITY)
Admit: 2020-06-27 | Discharge: 2020-06-27 | Disposition: A | Discharge status: Home / Self Care | Payer: Medicare Other | Attending: Internal Medicine | Admitting: Internal Medicine

## 2020-06-27 DIAGNOSIS — Z96611 Presence of right artificial shoulder joint: Secondary | ICD-10-CM | POA: Diagnosis present

## 2020-06-27 DIAGNOSIS — M810 Age-related osteoporosis without current pathological fracture: Secondary | ICD-10-CM | POA: Diagnosis present

## 2020-06-27 DIAGNOSIS — D329 Benign neoplasm of meninges, unspecified: Secondary | ICD-10-CM | POA: Diagnosis present

## 2020-06-27 DIAGNOSIS — E538 Deficiency of other specified B group vitamins: Secondary | ICD-10-CM | POA: Diagnosis present

## 2020-06-27 DIAGNOSIS — Z79890 Hormone replacement therapy (postmenopausal): Secondary | ICD-10-CM | POA: Diagnosis not present

## 2020-06-27 DIAGNOSIS — Z8249 Family history of ischemic heart disease and other diseases of the circulatory system: Secondary | ICD-10-CM | POA: Diagnosis not present

## 2020-06-27 DIAGNOSIS — E785 Hyperlipidemia, unspecified: Secondary | ICD-10-CM | POA: Diagnosis present

## 2020-06-27 DIAGNOSIS — G9341 Metabolic encephalopathy: Secondary | ICD-10-CM | POA: Diagnosis present

## 2020-06-27 DIAGNOSIS — Z794 Long term (current) use of insulin: Secondary | ICD-10-CM | POA: Diagnosis not present

## 2020-06-27 DIAGNOSIS — Z20822 Contact with and (suspected) exposure to covid-19: Secondary | ICD-10-CM | POA: Diagnosis present

## 2020-06-27 DIAGNOSIS — G8191 Hemiplegia, unspecified affecting right dominant side: Secondary | ICD-10-CM | POA: Diagnosis present

## 2020-06-27 DIAGNOSIS — Z87891 Personal history of nicotine dependence: Secondary | ICD-10-CM | POA: Diagnosis not present

## 2020-06-27 DIAGNOSIS — N390 Urinary tract infection, site not specified: Secondary | ICD-10-CM | POA: Diagnosis present

## 2020-06-27 DIAGNOSIS — Z888 Allergy status to other drugs, medicaments and biological substances status: Secondary | ICD-10-CM | POA: Diagnosis not present

## 2020-06-27 DIAGNOSIS — E119 Type 2 diabetes mellitus without complications: Secondary | ICD-10-CM

## 2020-06-27 DIAGNOSIS — E1121 Type 2 diabetes mellitus with diabetic nephropathy: Secondary | ICD-10-CM | POA: Diagnosis present

## 2020-06-27 DIAGNOSIS — E11649 Type 2 diabetes mellitus with hypoglycemia without coma: Secondary | ICD-10-CM

## 2020-06-27 DIAGNOSIS — Z7902 Long term (current) use of antithrombotics/antiplatelets: Secondary | ICD-10-CM | POA: Diagnosis not present

## 2020-06-27 DIAGNOSIS — Z87442 Personal history of urinary calculi: Secondary | ICD-10-CM | POA: Diagnosis not present

## 2020-06-27 DIAGNOSIS — I6523 Occlusion and stenosis of bilateral carotid arteries: Secondary | ICD-10-CM | POA: Diagnosis present

## 2020-06-27 DIAGNOSIS — Z79899 Other long term (current) drug therapy: Secondary | ICD-10-CM | POA: Diagnosis not present

## 2020-06-27 DIAGNOSIS — E039 Hypothyroidism, unspecified: Secondary | ICD-10-CM | POA: Diagnosis present

## 2020-06-27 DIAGNOSIS — I69354 Hemiplegia and hemiparesis following cerebral infarction affecting left non-dominant side: Secondary | ICD-10-CM | POA: Diagnosis not present

## 2020-06-27 DIAGNOSIS — I10 Essential (primary) hypertension: Secondary | ICD-10-CM | POA: Diagnosis present

## 2020-06-27 DIAGNOSIS — I447 Left bundle-branch block, unspecified: Secondary | ICD-10-CM | POA: Diagnosis present

## 2020-06-27 LAB — LIPID PANEL
Cholesterol: 197 mg/dL (ref 0–200)
HDL: 71 mg/dL (ref >40)
LDL Cholesterol: 107 mg/dL — ABNORMAL HIGH (ref 0–99)
Total CHOL/HDL Ratio: 2.8 RATIO
Triglycerides: 95 mg/dL (ref <150)
VLDL: 19 mg/dL (ref 0–40)

## 2020-06-27 LAB — URINALYSIS, ROUTINE W REFLEX MICROSCOPIC
Bilirubin Urine: NEGATIVE
Glucose, UA: >=500 mg/dL — AB
Hgb urine dipstick: NEGATIVE
Ketones, ur: NEGATIVE mg/dL
Nitrite: NEGATIVE
Protein, ur: NEGATIVE mg/dL
Specific Gravity, Urine: 1.009 (ref 1.005–1.030)
pH: 6 (ref 5.0–8.0)

## 2020-06-27 LAB — HEMOGLOBIN A1C
Hgb A1c MFr Bld: 7.6 % — ABNORMAL HIGH (ref 4.8–5.6)
Mean Plasma Glucose: 171.42 mg/dL

## 2020-06-27 LAB — GLUCOSE, CAPILLARY
Glucose-Capillary: 221 mg/dL — ABNORMAL HIGH (ref 70–99)
Glucose-Capillary: 294 mg/dL — ABNORMAL HIGH (ref 70–99)
Glucose-Capillary: 224 mg/dL — ABNORMAL HIGH (ref 70–99)
Glucose-Capillary: 291 mg/dL — ABNORMAL HIGH (ref 70–99)
Glucose-Capillary: 235 mg/dL — ABNORMAL HIGH (ref 70–99)

## 2020-06-27 LAB — RAPID URINE DRUG SCREEN, HOSP PERFORMED
Amphetamines: NOT DETECTED
Barbiturates: NOT DETECTED
Benzodiazepines: NOT DETECTED
Cocaine: NOT DETECTED
Opiates: NOT DETECTED
Tetrahydrocannabinol: NOT DETECTED

## 2020-06-27 MED ORDER — SODIUM CHLORIDE 0.9 % IV SOLN
1.0000 g | INTRAVENOUS | Status: DC
  Administered 2020-06-27: 1 g via INTRAVENOUS
  Filled 2020-06-27: qty 10

## 2020-06-27 MED ORDER — INSULIN DETEMIR 100 UNIT/ML ~~LOC~~ SOLN
15.0000 [IU] | Freq: Every day | SUBCUTANEOUS | Status: DC
  Administered 2020-06-27 – 2020-06-28 (×2): 15 [IU] via SUBCUTANEOUS
  Filled 2020-06-27 (×3): qty 0.15

## 2020-06-27 NOTE — Progress Notes (Addendum)
PROGRESS NOTE    CARNITA GOLOB  VZC:[id number] DOB: Oct 22, 1931 DOA: 06/26/2020 PCP: Perlie Mayo, NP    Brief Narrative:  Mrs. Goldberg was admitted to the hospital working diagnosis of focal neurologic deficit, to rule out ischemic event.   87 year old female past medical history for hypertension, dyslipidemia, meningioma, type 2 diabetes mellitus, hypothyroidism, CVA with left residual deficit who presented with acute right-sided weakness and confusion (differnet from her baseline).  Symptom onset about 12 hours prior to hospitalization.  On her initial physical examination blood pressure 153/66, heart rate 86, respirate 15, temperature 98, oxygen saturation 94%, lungs clear to auscultation bilaterally, heart S1-S2, present rhythm, soft abdomen, no lower extremity edema, positive new right-sided upper and lower extremity weakness, unchanged left-sided residual weakness.  Sodium 139, potassium 3.8, chloride 109, bicarb 23, glucose 62, BUN 24, creatinine 0.80, white count 7.8, hemoglobin 12.0, hematocrit 36.6, platelets 396. SARS COVID-19 negative. Urine analysis specific gravity 1.009, 21-50 white cells, 0-5 red cells.  Large leukocytes.  Glucose > 500.  Head CT with no acute intracranial changes, chronic right MCA infarct, meningioma along the falx.  CT angiography with large vessel occlusion involving the left A2-A3 segment, chronic right ICA occlusion.  High-grade narrowing of the left carotid bulb/ICA origin with distal reconstitution.  9 mm infarct core involving the bifrontal region.  EKG 90 bpm, left axis deviation, left bundle branch block, positive PVCs, sinus rhythm, no significant ST segment T wave changes.  Assessment & Plan:   Principal Problem:   Acute metabolic encephalopathy Active Problems:   Uncontrolled type 2 diabetes mellitus with diabetic nephropathy, with long-term current use of insulin (HCC)   Hypothyroidism (acquired)   Hypertension   Carotid  stenosis, bilateral   Osteoporosis   Hyperlipidemia   History of CVA with residual deficit   Acute lower UTI   Type 2 diabetes mellitus with hypoglycemia without coma ([place])   1. Acute metabolic encephalopathy, multifactorial hypoglycemia, acute urine infection/ to rule out seizures.  Patient's symptoms are improving but not yet back to baseline, her strength on the right is improving.  Positive dysuria.  MRI negative for CVA.   Plan for continue neuro checks, check EEG for possible seizures and start patient on antibiotic therapy (ceftriaxone). Follow with pT/oT and neurology recommendations.   2. T2DM uncontrolled with hypoglycemia/ hyperglycemia. dyslipidemia. Capillary glucose 61, 135, 316, 257, 221.  Continue basal insulin with glargine 15 units daily (home dose is 31 units), and continue sliding scale for glucose cover and monitoring.   On high dose atorvastatin.   3. HTN. Continue blood pressure control with amlodipine.   4. Hypothyroid. Continue with levothyroxine,   5. B12 deficiency. Continue supplementation.   6. Hx of ischemic CVA with left hemiparesis. Contracted left upper extremity, continue clopidogrel and atorvastatin (80 mg).   Patient continue to be at high risk for worsening encephalopathy and urine infection.   Status is: Observation  The patient will require care spanning > 2 midnights and should be moved to inpatient because: IV treatments appropriate due to intensity of illness or inability to take PO  Dispo: The patient is from: Home              Anticipated d/c is to: Home              Anticipated d/c date is: 3 days              Patient currently is not medically stable to d/c.   Difficult to  place patient No   DVT prophylaxis: Enoxaparin   Code Status:   full  Family Communication:  I spoke with patient's husband at the bedside, we talked in detail about patient's condition, plan of care and prognosis and all questions were addressed.      Consultants:   Neurology    Subjective: Patient not yet back to her baseline, continue to have right sided weakness, but improving. Positive dysuria. Tolerating po well.   Objective: Vitals:   06/26/20 2240 06/27/20 0000 06/27/20 0240 06/27/20 0821  BP: (!) 146/64 (!) 155/88 (!) 141/64 (!) 141/66  Pulse: 82 85 88 82  Resp: 16 15 15 16   Temp: 98 F (36.7 C) 97.6 F (36.4 C) 98.6 F (37 C) 98.5 F (36.9 C)  TempSrc: Oral Oral Oral Oral  SpO2: 100% 100% 100% 100%  Weight:      Height:        Intake/Output Summary (Last 24 hours) at 06/27/2020 0936 Last data filed at 06/27/2020 0910 Gross per 24 hour  Intake 538.83 ml  Output 1500 ml  Net -961.17 ml   Filed Weights   06/26/20 1051  Weight: 68.6 kg    Examination:   General: Not in pain or dyspnea. Deconditioned  Neurology: Awake and alert, left upper extremity with contracture, right sided 4/5.   E ENT: no pallor, no icterus, oral mucosa moist Cardiovascular: No JVD. S1-S2 present, rhythmic, no gallops, rubs, or murmurs. No lower extremity edema. Pulmonary: positive breath sounds bilaterally Gastrointestinal. Abdomen soft and non tender Skin. No rashes Musculoskeletal: no joint deformities     Data Reviewed: I have personally reviewed following labs and imaging studies  CBC: Recent Labs  Lab 06/26/20 1031  WBC 7.8  NEUTROABS 4.9  HGB 12.0  HCT 36.6  MCV 88.0  PLT 248   Basic Metabolic Panel: Recent Labs  Lab 06/26/20 1031  NA 139  K 3.8  CL 109  CO2 23  GLUCOSE 62*  BUN 24*  CREATININE 0.80  CALCIUM 9.3   GFR: Estimated Creatinine Clearance: 46.9 mL/min (by C-G formula based on SCr of 0.8 mg/dL). Liver Function Tests: Recent Labs  Lab 06/26/20 1031  AST 46*  ALT 26  ALKPHOS 96  BILITOT 0.7  PROT 6.5  ALBUMIN 3.5   No results for input(s): LIPASE, AMYLASE in the last 168 hours. No results for input(s): AMMONIA in the last 168 hours. Coagulation Profile: Recent Labs  Lab  06/26/20 1031  INR 0.9   Cardiac Enzymes: No results for input(s): CKTOTAL, CKMB, CKMBINDEX, TROPONINI in the last 168 hours. BNP (last 3 results) No results for input(s): PROBNP in the last 8760 hours. HbA1C: Recent Labs    06/26/20 1031  HGBA1C 7.6*   CBG: Recent Labs  Lab 06/26/20 1039 06/26/20 1120 06/26/20 2117 06/26/20 2225 06/27/20 0740  GLUCAP 61* 135* 316* 257* 221*   Lipid Profile: Recent Labs    06/27/20 0537  CHOL 197  HDL 71  LDLCALC 107*  TRIG 95  CHOLHDL 2.8   Thyroid Function Tests: Recent Labs    06/26/20 1031  TSH 0.049*   Anemia Panel: Recent Labs    06/26/20 [place]*      Radiology Studies: I have reviewed all of the imaging during this hospital visit personally     Scheduled Meds: . atorvastatin  80 mg Oral q1800  . clopidogrel  75 mg Oral Daily  . heparin injection (subcutaneous)  5,000 Units Subcutaneous Q8H  . insulin aspart  0-5 Units Subcutaneous QHS  . insulin aspart  0-9 Units Subcutaneous TID WC  . insulin detemir  5 Units Subcutaneous QHS  . levothyroxine  50 mcg Oral Q0600  . senna-docusate  2 tablet Oral BID  . vitamin B-12  1,000 mcg Oral Daily   Continuous Infusions:   LOS: 0 days        Timiko Offutt Gerome Apley, MD

## 2020-06-27 NOTE — Progress Notes (Signed)
SLP Cancellation Note  Patient Details Name: Danielle [name] MRN: [id number] DOB: [birth year]   Cancelled treatment:       Reason Eval/Treat Not Completed: SLP screened, no needs identified, will sign off; SLP screened Pt in room. Pt denies any changes in speech, language, or cognition. MRI negative for acute changes. SLE will be deferred at this time. Pt passed the [place], however she did state that she occasionally coughs with thin liquids. She was observed with thin liquids during my visit (cup/straw) and did not exhibit signs of aspiration, however she was encouraged to monitor and take small sips. Reconsult if indicated. SLP will sign off.   Thank you,  Genene Churn, [place]  [place] 06/27/2020, 9:49 AM

## 2020-06-27 NOTE — TOC Initial Note (Signed)
Transition of Care [facility]) - Initial/Assessment Note    Patient Details  Name: Danielle [name] MRN: [id number] Date of Birth: Oct 13, 1931  [facility] of [facility]) CM/SW Contact:    Iona Beard, [place] Phone Number: 06/27/2020, 9:10 PM  Clinical Narrative:                 Pt admitted due to Acute metabolic encephalopathy. PT is recommending HHPT. CSW spoke with pt in room to complete assessment. Pt states she lives with her husband. Pt is independent in ADLs and her husband can provide transportation when needed. Pt states that she does not use any DME. CSW inquired about pts interest in [facility] PT, pt states she does not want HH. CSW inquired about outpatient PT, pt states she is interested in this and would like CSW to make referral. CSW to add to AVS. TOC to follow for possible d/c needs.   Expected Discharge Plan: Home/Self Care Barriers to Discharge: Continued Medical Work up   Patient Goals and CMS Choice Patient states their goals for this hospitalization and ongoing recovery are:: Go back home   Choice offered to / list presented to : NA  Expected Discharge Plan and Services Expected Discharge Plan: Home/Self Care In-house Referral: NA Discharge Planning Services: NA Post Acute Care Choice:  (Outpatient PT)                   DME Arranged: N/A DME Agency: NA       HH Arranged: NA HH Agency: NA        Prior Living Arrangements/Services   Lives with:: Spouse Patient language and need for interpreter reviewed:: Yes Do you feel safe going back to the place where you live?: Yes      Need for Family Participation in Patient Care: No (Comment) Care giver support system in place?: Yes (comment)   Criminal Activity/Legal Involvement Pertinent to Current Situation/Hospitalization: No - Comment as needed  Activities of Daily Living Home Assistive Devices/Equipment: Built-in shower seat,Eyeglasses,Grab bars in shower,Grab bars around toilet,Walker (specify type) ADL  Screening (condition at time of admission) Patient's cognitive ability adequate to safely complete daily activities?: No Is the patient deaf or have difficulty hearing?: No Does the patient have difficulty seeing, even when wearing glasses/contacts?: No Does the patient have difficulty concentrating, remembering, or making decisions?: No Patient able to express need for assistance with ADLs?: Yes Does the patient have difficulty dressing or bathing?: Yes Independently performs ADLs?: No Communication: Independent Dressing (OT): Needs assistance Is this a change from baseline?: Pre-admission baseline Grooming: Needs assistance Is this a change from baseline?: Pre-admission baseline Feeding: Needs assistance Is this a change from baseline?: Pre-admission baseline Bathing: Needs assistance Is this a change from baseline?: Pre-admission baseline Toileting: Needs assistance Is this a change from baseline?: Pre-admission baseline In/Out Bed: Needs assistance Is this a change from baseline?: Pre-admission baseline Walks in Home: Needs assistance Is this a change from baseline?: Pre-admission baseline Does the patient have difficulty walking or climbing stairs?: Yes Weakness of Legs: Both Weakness of Arms/Hands: Both  Permission Sought/Granted                  Emotional Assessment Appearance:: Appears stated age Attitude/Demeanor/Rapport: Engaged Affect (typically observed): Accepting Orientation: : Oriented to Self,Oriented to  Time,Oriented to Place,Oriented to Situation Alcohol / Substance Use: Not Applicable Psych Involvement: No (comment)  Admission diagnosis:  Acute ischemic stroke ([place]) [T26.7] Metabolic encephalopathy [T24.58] Patient Active Problem List   Diagnosis  Date Noted  . Acute lower UTI 06/27/2020  . Type 2 diabetes mellitus with hypoglycemia without coma ([place]) 06/27/2020  . Acute metabolic encephalopathy 28/76/8115  . Metabolic encephalopathy 72/62/0355  .  Acute ischemic stroke ([place]) 06/26/2020  . Carotid artery occlusion with infarction ([place]) 04/10/2020  . Fracture of left wrist with routine healing 03/15/2020  . Closed fracture of left pubis with routine healing 03/15/2020  . Leg swelling 02/15/2020  . Hospital discharge follow-up 02/15/2020  . Other fracture of upper end of left radius, closed fracture--transverse fracture in the distal radial metaphysis 12/31/2019  . Closed fracture of multiple pubic rami, left, initial encounter ([place]) 12/29/2019  . Fall at home, initial encounter 12/29/2019  . Breast pain, left 12/22/2019  . History of CVA with residual deficit 11/10/2019  . Osteoporosis 04/19/2019  . Hyperlipidemia 04/19/2019  . Carotid stenosis, bilateral 12/30/2018  . Hypothyroidism (acquired) 05/03/2018  . Hypertension 05/03/2018  . Uncontrolled type 2 diabetes mellitus with diabetic nephropathy, with long-term current use of insulin ([place])    PCP:  Perlie Mayo, NP Pharmacy:   [id number] - [place], [address] [place] [address] Phone: [phone] Fax: [place], [address] [place] [address] Phone: [phone] Fax: [phone]     Social Determinants of Health (SDOH) Interventions    Readmission Risk Interventions Readmission Risk Prevention Plan 06/27/2020  Medication Screening Complete  Transportation Screening Complete  Some recent data might be hidden

## 2020-06-27 NOTE — Progress Notes (Signed)
Physical Therapy Treatment Patient Details Name: Danielle [name] MRN: [id number] DOB: June 27, 1931 Today's Date: 06/27/2020    History of Present Illness Danielle [name] is a 87 y.o. female with medical history significant for stroke with L hemiplegia, HTN, osteoporosis, diabetes presents with current c/o R side weakness and some confusion    PT Comments    Patient presents seated in chair (assisted by OT) and agreeable for therapy.  Patient incontinent of urine upon standing and able to ambulate to bathroom to transfer to commode to finish having a bowel movement demonstrating fair/good sitting balance.  Patient demonstrates good return for using quad-cane during transfers and taking steps in room/hallway without loss of balance, limited mostly due to fatigue and tolerated sitting up in chair with her spouse present after therapy.  Patient will benefit from continued physical therapy in hospital and recommended venue below to increase strength, balance, endurance for safe ADLs and gait.    Follow Up Recommendations  Home health PT;Supervision for mobility/OOB;Supervision - Intermittent     Equipment Recommendations  None recommended by PT    Recommendations for Other Services       Precautions / Restrictions Precautions Precautions: Fall Restrictions Weight Bearing Restrictions: No    Mobility  Bed Mobility Overal bed mobility: Needs Assistance Bed Mobility: Supine to Sit     Supine to sit: Min assist;HOB elevated     General bed mobility comments: Patient presents up in chair (assisted by OT)  Transfers Overall transfer level: Needs assistance Equipment used: Quad cane Transfers: Sit to/from [employer] to Stand: Min guard;Min assist Stand pivot transfers: Min guard;Min assist       General transfer comment: labored movement, increased time  Ambulation/Gait Ambulation/Gait assistance: Min guard;Min assist Gait Distance (Feet): 40  Feet Assistive device: Quad cane Gait Pattern/deviations: Decreased step length - right;Decreased step length - left;Decreased stride length;Step-to pattern Gait velocity: decreased   General Gait Details: increased endurance/distance for ambulation demonstrating slow labored cadence with good return for using quad-cane without loss of balance, limited mostly due to fatigue   Stairs             Wheelchair Mobility    Modified Rankin (Stroke Patients Only)       Balance Overall balance assessment: Needs assistance Sitting-balance support: Feet supported;No upper extremity supported Sitting balance-Leahy Scale: Fair Sitting balance - Comments: fair/good seated in chair and on commode   Standing balance support: During functional activity;Single extremity supported Standing balance-Leahy Scale: Fair Standing balance comment: using quad-cane                            Cognition Arousal/Alertness: Awake/alert Behavior During Therapy: WFL for tasks assessed/performed Overall Cognitive Status: History of cognitive impairments - at baseline                                        Exercises General Exercises - Lower Extremity Long Arc Quad: Seated;AROM;Strengthening;Both;10 reps Hip Flexion/Marching: Seated;AROM;Strengthening;Both;10 reps Toe Raises: Seated;AROM;Strengthening;10 reps;Both Heel Raises: Seated;AROM;Strengthening;Both;10 reps    General Comments        Pertinent Vitals/Pain Pain Assessment: No/denies pain    Home Living Family/patient expects to be discharged to:: Private residence Living Arrangements: Spouse/significant other Available Help at Discharge: Family;Available 24 hours/day;Personal care attendant Type of Home: House Home Access: Stairs to enter Entrance Stairs-Rails: None Home  Layout: One level Home Equipment: Walker - 2 wheels;Cane - single point;Bedside commode;Shower seat;Wheelchair - manual;Cane - quad       Prior Function Level of Independence: Needs assistance  Gait / Transfers Assistance Needed: spouse reports pt ambulates with quad cane and gait belt with spouse or aide steadying pt while she ambulates throughout the home, WC for community distances. Husband rolls her to table in the [facility] in the mornings ADL's / Homemaking Assistance Needed: spouse reports he or aide assists pt with bathing and dressing, pt can feed self. Has aide 7 hrs/day, 5 days/week     PT Goals (current goals can now be found in the care plan section) Acute Rehab PT Goals Patient Stated Goal: to get stronger and go home PT Goal Formulation: With family Time For Goal Achievement: 07/10/20 Potential to Achieve Goals: Good Progress towards PT goals: Progressing toward goals    Frequency    Min 3X/week      PT Plan Discharge plan needs to be updated    Co-evaluation              AM-PAC PT "6 Clicks" Mobility   Outcome Measure  Help needed turning from your back to your side while in a flat bed without using bedrails?: A Little Help needed moving from lying on your back to sitting on the side of a flat bed without using bedrails?: A Little Help needed moving to and from a bed to a chair (including a wheelchair)?: A Little Help needed standing up from a chair using your arms (e.g., wheelchair or bedside chair)?: A Little Help needed to walk in hospital room?: A Little Help needed climbing 3-5 steps with a railing? : Total 6 Click Score: 16    End of Session Equipment Utilized During Treatment: Gait belt Activity Tolerance: Patient tolerated treatment well;Patient limited by fatigue Patient left: in chair;with call bell/phone within reach;with family/visitor present Nurse Communication: Mobility status PT Visit Diagnosis: Unsteadiness on feet (R26.81);Other abnormalities of gait and mobility (R26.89);Muscle weakness (generalized) (M62.81);Other symptoms and signs involving the nervous system  (R29.898);Hemiplegia and hemiparesis Hemiplegia - Right/Left: Left Hemiplegia - dominant/non-dominant: Non-dominant Hemiplegia - caused by: Cerebral infarction     Time: 9169-4503 PT Time Calculation (min) (ACUTE ONLY): 32 min  Charges:  $Gait Training: 8-22 mins $Therapeutic Exercise: 8-22 mins                     11:33 AM, 06/27/20 Lonell Grandchild, MPT Physical Therapist with [facility] [phone] office [phone] mobile phone

## 2020-06-27 NOTE — Progress Notes (Signed)
Inpatient Rehabilitation Admissions Coordinator  Noted OT recommends [facility] and patient is under observation status. I will not pursue inpatient rehab consult for CIR at [facility].  Danne Baxter, RN, MSN Rehab Admissions Coordinator [phone] 06/27/2020 9:03 AM

## 2020-06-27 NOTE — Progress Notes (Signed)
EEG completed, results pending. 

## 2020-06-27 NOTE — Evaluation (Signed)
Occupational Therapy Evaluation Patient Details Name: Danielle [name] MRN: [id number] DOB: March 16, 1932 Today's Date: 06/27/2020    History of Present Illness Danielle [name] is a 87 y.o. female with medical history significant for stroke with L hemiplegia, HTN, osteoporosis, diabetes presents with current c/o R side weakness and some confusion   Clinical Impression   Pt agreeable to OT evaluation, husband present. Clinician is very familiar with pt from prior acute and outpatient rehab services. At baseline pt is unable to use LUE functionally due to prior CVAs. This am pt using RUE as dominant, strength is 4/5 throughout and pt is using to complete with mobility and ADLs. Pt requiring min assist initially for transfer task, then requesting to use BSC and decreasing assist to min guard. Verbal cuing for sequencing and hand placement. Pt is close to baseline at this time, requires significant assistance for ADLs from husband and aide. Uses wheelchair and quad cane in the home for functional mobility. OT notes contracture of left hand, pt has splint at home but does not wear. Educated pt and husband on importance of stretching fingers and wearing splint to prevent further progression of contracture. Recommend [place] OT services on discharge to assess functioning in the home environment and provide services as needed. Will continue to follow in acute care.     Follow Up Recommendations  Home health OT;Supervision/Assistance - 24 hour    Equipment Recommendations  None recommended by OT       Precautions / Restrictions Precautions Precautions: Fall Restrictions Weight Bearing Restrictions: No      Mobility Bed Mobility Overal bed mobility: Needs Assistance Bed Mobility: Supine to Sit     Supine to sit: Min assist;HOB elevated     General bed mobility comments: no posterior lean this am, able to maintain sitting at EOB without difficulty for ~5 minutes prior to transfer task     Transfers Overall transfer level: Needs assistance Equipment used: 1 person hand held assist Transfers: Sit to/from [facility] Sit to Stand: Mod assist (on initial attempt, min for subsequent trials) Stand pivot transfers: Min guard;Min assist       General transfer comment: Min assist for initial transfer, decreasing to min guard for transfer to and from [facility]        ADL either performed or assessed with clinical judgement   ADL Overall ADL's : Needs assistance/impaired     Grooming: Wash/dry hands;Set up;Sitting               Lower Body Dressing: Maximal assistance;Bed level Lower Body Dressing Details (indicate cue type and reason): OT donning socks with pt sitting up in bed Toilet Transfer: Min guard;Minimal assistance;Stand-pivot;BSC Toilet Transfer Details (indicate cue type and reason): Pt transferring to [facility] with hand held assist, min assist to boost to standing, verbal cuing for foot placement Toileting- Clothing Manipulation and Hygiene: Min guard;Sit to/from stand Toileting - Clothing Manipulation Details (indicate cue type and reason): OT providing min guard for pt while standing to perform peri-care       General ADL Comments: Pt requiring mod to max assist for ADLs, husband present, reports pt appears close to baseline     Vision Baseline Vision/History: No visual deficits Patient Visual Report: No change from baseline Vision Assessment?: No apparent visual deficits            Pertinent Vitals/Pain Pain Assessment: No/denies pain     Hand Dominance Right   Extremity/Trunk Assessment Upper Extremity Assessment Upper Extremity Assessment:  LUE deficits/detail (RUE strength is 4/5 throughout) LUE Deficits / Details: LUE hypertonic, hand in fisted position with increased time to allow for passive extension of digits. LUE strength is1/5 to 2-/5 throughout-baseline LUE Sensation: WNL LUE Coordination: decreased fine motor;decreased  gross motor   Lower Extremity Assessment Lower Extremity Assessment: Defer to PT evaluation   Cervical / Trunk Assessment Cervical / Trunk Assessment: Kyphotic   Communication Communication Communication: No difficulties   Cognition Arousal/Alertness: Awake/alert Behavior During Therapy: WFL for tasks assessed/performed Overall Cognitive Status: Within Functional Limits for tasks assessed                                                Home Living Family/patient expects to be discharged to:: Private residence Living Arrangements: Spouse/significant other Available Help at Discharge: Family;Available 24 hours/day;Personal care attendant Type of Home: House Home Access: Stairs to enter [employer] of Steps: 2 - very wide/deep Entrance Stairs-Rails: None Home Layout: One level     Bathroom Shower/Tub: Teacher, early years/pre: Standard     Home Equipment: Environmental consultant - 2 wheels;Cane - single point;Bedside commode;Shower seat;Wheelchair - manual;Cane - quad          Prior Functioning/Environment Level of Independence: Needs assistance  Gait / Transfers Assistance Needed: spouse reports pt ambulates with quad cane and gait belt with spouse or aide steadying pt while she ambulates throughout the home, WC for community distances. Husband rolls her to table in the [facility] in the mornings ADL's / Homemaking Assistance Needed: spouse reports he or aide assists pt with bathing and dressing, pt can feed self. Has aide 7 hrs/day, 5 days/week            OT Problem List: Decreased strength;Decreased activity tolerance;Impaired balance (sitting and/or standing);Decreased safety awareness;Decreased knowledge of use of DME or AE;Impaired UE functional use      OT Treatment/Interventions: Self-care/ADL training;Therapeutic exercise;Neuromuscular education;Patient/family education;Therapeutic activities    OT Goals(Current goals can be found in the care  plan section) Acute Rehab OT Goals Patient Stated Goal: to get stronger and go home OT Goal Formulation: With patient Time For Goal Achievement: 07/11/20 Potential to Achieve Goals: Good  OT Frequency: Min 2X/week              AM-PAC OT "6 Clicks" Daily Activity     Outcome Measure Help from another person eating meals?: A Little Help from another person taking care of personal grooming?: A Little Help from another person toileting, which includes using toliet, bedpan, or urinal?: A Little Help from another person bathing (including washing, rinsing, drying)?: A Lot Help from another person to put on and taking off regular upper body clothing?: A Lot Help from another person to put on and taking off regular lower body clothing?: A Lot 6 Click Score: 15   End of Session Equipment Utilized During Treatment: Gait belt  Activity Tolerance: Patient tolerated treatment well Patient left: in chair;with call bell/phone within reach;with chair alarm set;with family/visitor present  OT Visit Diagnosis: Muscle weakness (generalized) (M62.81)                Time: 6144-3154 OT Time Calculation (min): 41 min Charges:  OT General Charges $OT Visit: 1 Visit OT Evaluation $OT Eval Low Complexity: 1 Low OT Treatments $Self Care/Home Management : 8-22 mins   Guadelupe Sabin, OTR/L  [phone]  06/27/2020, 8:48 AM

## 2020-06-27 NOTE — Procedures (Addendum)
Patient Name: Danielle [name]  MRN: [id number]  Epilepsy Attending: Lora Havens  Referring Physician/Provider: Dr Sander Radon Date: 06/27/2020 Duration: 24.50 mins  Patient history: 87yo F with ams. EEG to evaluate for seizure  Level of alertness: Awake  AEDs during EEG study: None  Technical aspects: This EEG study was done with scalp electrodes positioned according to the 10-20 International system of electrode placement. Electrical activity was acquired at a sampling rate of 500Hz  and reviewed with a high frequency filter of 70Hz  and a low frequency filter of 1Hz . EEG data were recorded continuously and digitally stored.   Description: The posterior dominant rhythm consists of 8-9 Hz activity of moderate voltage (25-35 uV) seen predominantly in posterior head regions, symmetric and reactive to eye opening and eye closing. EEG showed intermittent 3 to 4 Hz delta slowing in left temporal region. Hyperventilation and photic stimulation were not performed.     ABNORMALITY -Intermittent slow, left temporal region  IMPRESSION: This study is suggestive of nonspecific cortical dysfunction in left temporal region. No seizures or epileptiform discharges were seen throughout the recording.  Therin Vetsch Barbra Sarks

## 2020-06-27 NOTE — Plan of Care (Signed)
  Problem: Acute Rehab OT Goals (only OT should resolve) Goal: Pt. Will Perform Grooming Flowsheets (Taken 06/27/2020 0853) Pt Will Perform Grooming:  with set-up  sitting Goal: Pt. Will Perform Upper Body Dressing Flowsheets (Taken 06/27/2020 0853) Pt Will Perform Upper Body Dressing:  with min assist  sitting Goal: Pt. Will Transfer To Toilet Flowsheets (Taken 06/27/2020 [id number]) Pt Will Transfer to Toilet:  with supervision  stand pivot transfer  bedside commode Goal: Pt. Will Perform Toileting-Clothing Manipulation Flowsheets (Taken 06/27/2020 0853) Pt Will Perform Toileting - Clothing Manipulation and hygiene:  with supervision  sitting/lateral leans  sit to/from stand

## 2020-06-27 NOTE — Plan of Care (Signed)
  Problem: Education: Goal: Knowledge of General Education information will improve Description: Including pain rating scale, medication(s)/side effects and non-pharmacologic comfort measures Outcome: Progressing   Problem: Health Behavior/Discharge Planning: Goal: Ability to manage health-related needs will improve Outcome: Progressing   Problem: Clinical Measurements: Goal: Ability to maintain clinical measurements within normal limits will improve Outcome: Progressing Goal: Will remain free from infection Outcome: Progressing Goal: Diagnostic test results will improve Outcome: Progressing Goal: Respiratory complications will improve Outcome: Progressing Goal: Cardiovascular complication will be avoided Outcome: Progressing   Problem: Activity: Goal: Risk for activity intolerance will decrease Outcome: Progressing   Problem: Nutrition: Goal: Adequate nutrition will be maintained Outcome: Progressing   Problem: Coping: Goal: Level of anxiety will decrease Outcome: Progressing   Problem: Elimination: Goal: Will not experience complications related to bowel motility Outcome: Progressing Goal: Will not experience complications related to urinary retention Outcome: Progressing   Problem: Pain Managment: Goal: General experience of comfort will improve Outcome: Progressing   Problem: Safety: Goal: Ability to remain free from injury will improve Outcome: Progressing   Problem: Skin Integrity: Goal: Risk for impaired skin integrity will decrease Outcome: Progressing   Problem: Education: Goal: Knowledge of disease or condition will improve Outcome: Progressing Goal: Knowledge of secondary prevention will improve Outcome: Progressing Goal: Knowledge of patient specific risk factors addressed and post discharge goals established will improve Outcome: Progressing Goal: Individualized Educational Video(s) Outcome: Progressing   Problem: Coping: Goal: Will verbalize  positive feelings about self Outcome: Progressing Goal: Will identify appropriate support needs Outcome: Progressing   Problem: Health Behavior/Discharge Planning: Goal: Ability to manage health-related needs will improve Outcome: Progressing   Problem: Self-Care: Goal: Ability to participate in self-care as condition permits will improve Outcome: Progressing Goal: Verbalization of feelings and concerns over difficulty with self-care will improve Outcome: Progressing Goal: Ability to communicate needs accurately will improve Outcome: Progressing   Problem: Nutrition: Goal: Risk of aspiration will decrease Outcome: Progressing Goal: Dietary intake will improve Outcome: Progressing   Problem: Intracerebral Hemorrhage Tissue Perfusion: Goal: Complications of Intracerebral Hemorrhage will be minimized Outcome: Progressing   Problem: Ischemic Stroke/TIA Tissue Perfusion: Goal: Complications of ischemic stroke/TIA will be minimized Outcome: Progressing   

## 2020-06-28 DIAGNOSIS — E039 Hypothyroidism, unspecified: Secondary | ICD-10-CM

## 2020-06-28 LAB — CBC WITH DIFFERENTIAL/PLATELET
Abs Immature Granulocytes: 0.03 10*3/uL (ref 0.00–0.07)
Basophils Absolute: 0.1 10*3/uL (ref 0.0–0.1)
Basophils Relative: 1 %
Eosinophils Absolute: 0.3 10*3/uL (ref 0.0–0.5)
Eosinophils Relative: 3 %
HCT: 40.5 % (ref 36.0–46.0)
Hemoglobin: 12.8 g/dL (ref 12.0–15.0)
Immature Granulocytes: 0 %
Lymphocytes Relative: 25 %
Lymphs Abs: 2.3 10*3/uL (ref 0.7–4.0)
MCH: 28.3 pg (ref 26.0–34.0)
MCHC: 31.6 g/dL (ref 30.0–36.0)
MCV: 89.4 fL (ref 80.0–100.0)
Monocytes Absolute: 1 10*3/uL (ref 0.1–1.0)
Monocytes Relative: 11 %
Neutro Abs: 5.6 10*3/uL (ref 1.7–7.7)
Neutrophils Relative %: 60 %
Platelets: 307 10*3/uL (ref 150–400)
RBC: 4.53 MIL/uL (ref 3.87–5.11)
RDW: 18.6 % — ABNORMAL HIGH (ref 11.5–15.5)
WBC: 9.3 10*3/uL (ref 4.0–10.5)
nRBC: 0 % (ref 0.0–0.2)

## 2020-06-28 LAB — GLUCOSE, CAPILLARY: Glucose-Capillary: 229 mg/dL — ABNORMAL HIGH (ref 70–99)

## 2020-06-28 LAB — BASIC METABOLIC PANEL
Anion gap: 8 (ref 5–15)
BUN: 15 mg/dL (ref 8–23)
CO2: 24 mmol/L (ref 22–32)
Calcium: 9.5 mg/dL (ref 8.9–10.3)
Chloride: 105 mmol/L (ref 98–111)
Creatinine, Ser: 0.71 mg/dL (ref 0.44–1.00)
GFR, Estimated: >60 mL/min (ref >60)
Glucose, Bld: 240 mg/dL — ABNORMAL HIGH (ref 70–99)
Potassium: 4 mmol/L (ref 3.5–5.1)
Sodium: 137 mmol/L (ref 135–145)

## 2020-06-28 MED ORDER — CEPHALEXIN 500 MG PO CAPS
500.0000 mg | ORAL_CAPSULE | Freq: Two times a day (BID) | ORAL | Status: DC
  Administered 2020-06-28: 500 mg via ORAL
  Filled 2020-06-28: qty 1

## 2020-06-28 MED ORDER — CEPHALEXIN 500 MG PO CAPS: 500.0000 mg | ORAL_CAPSULE | Freq: Two times a day (BID) | ORAL | 0 refills | Status: AC

## 2020-06-28 NOTE — Plan of Care (Signed)
  Problem: Education: Goal: Knowledge of General Education information will improve Description: Including pain rating scale, medication(s)/side effects and non-pharmacologic comfort measures Outcome: Adequate for Discharge   Problem: Health Behavior/Discharge Planning: Goal: Ability to manage health-related needs will improve Outcome: Adequate for Discharge   Problem: Clinical Measurements: Goal: Ability to maintain clinical measurements within normal limits will improve Outcome: Adequate for Discharge Goal: Will remain free from infection Outcome: Adequate for Discharge Goal: Diagnostic test results will improve Outcome: Adequate for Discharge Goal: Respiratory complications will improve Outcome: Adequate for Discharge Goal: Cardiovascular complication will be avoided Outcome: Adequate for Discharge   Problem: Activity: Goal: Risk for activity intolerance will decrease Outcome: Adequate for Discharge   Problem: Nutrition: Goal: Adequate nutrition will be maintained Outcome: Adequate for Discharge   Problem: Coping: Goal: Level of anxiety will decrease Outcome: Adequate for Discharge   Problem: Elimination: Goal: Will not experience complications related to bowel motility Outcome: Adequate for Discharge Goal: Will not experience complications related to urinary retention Outcome: Adequate for Discharge   Problem: Pain Managment: Goal: General experience of comfort will improve Outcome: Adequate for Discharge   Problem: Safety: Goal: Ability to remain free from injury will improve Outcome: Adequate for Discharge   Problem: Skin Integrity: Goal: Risk for impaired skin integrity will decrease Outcome: Adequate for Discharge   Problem: Education: Goal: Knowledge of disease or condition will improve Outcome: Adequate for Discharge Goal: Knowledge of secondary prevention will improve Outcome: Adequate for Discharge Goal: Knowledge of patient specific risk factors  addressed and post discharge goals established will improve Outcome: Adequate for Discharge Goal: Individualized Educational Video(s) Outcome: Adequate for Discharge   Problem: Coping: Goal: Will verbalize positive feelings about self Outcome: Adequate for Discharge Goal: Will identify appropriate support needs Outcome: Adequate for Discharge   Problem: Health Behavior/Discharge Planning: Goal: Ability to manage health-related needs will improve Outcome: Adequate for Discharge   Problem: Self-Care: Goal: Ability to participate in self-care as condition permits will improve Outcome: Adequate for Discharge Goal: Verbalization of feelings and concerns over difficulty with self-care will improve Outcome: Adequate for Discharge Goal: Ability to communicate needs accurately will improve Outcome: Adequate for Discharge   Problem: Nutrition: Goal: Risk of aspiration will decrease Outcome: Adequate for Discharge Goal: Dietary intake will improve Outcome: Adequate for Discharge   Problem: Intracerebral Hemorrhage Tissue Perfusion: Goal: Complications of Intracerebral Hemorrhage will be minimized Outcome: Adequate for Discharge   Problem: Ischemic Stroke/TIA Tissue Perfusion: Goal: Complications of ischemic stroke/TIA will be minimized Outcome: Adequate for Discharge   

## 2020-06-28 NOTE — Progress Notes (Signed)
Occupational Therapy Treatment Patient Details Name: Danielle [name] MRN: [id number] DOB: 28-Oct-1931 Today's Date: 06/28/2020    History of present illness Danielle [name] is a 87 y.o. female with medical history significant for stroke with L hemiplegia, HTN, osteoporosis, diabetes presents with current c/o R side weakness and some confusion   OT comments  Pt was pleasant and agreeable to treatment this date. Pt required minimal to moderate static standing balance assist this date when attempting standing grooming. Graded to seated in chair with s/u assist. Minimal assist during functional ambulation. Minimal assist needed for sit to stand for initial transfer. Pt will continue to benefit from acute occupational therapy to address functional ADLs and strength.   Follow Up Recommendations  Home health OT;Supervision/Assistance - 24 hour    Equipment Recommendations  None recommended by OT    Recommendations for Other Services      Precautions / Restrictions Precautions Precautions: Fall Precaution Comments: L hemiplegia Restrictions Weight Bearing Restrictions: No       Mobility Bed Mobility                  Transfers     Transfers: Sit to/from Stand           General transfer comment: labored movement, increased time; minimal assist.    Balance Overall balance assessment: Needs assistance         Standing balance support: During functional activity;Single extremity supported   Standing balance comment: using quad-cane                           ADL either performed or assessed with clinical judgement   ADL Overall ADL's : Needs assistance/impaired     Grooming: Oral care;Minimal assistance;Moderate assistance;Standing;Set up;Sitting Grooming Details (indicate cue type and reason): Attempted oral care standing at sink but pt lacked static standing balance via posterior lean on therapist requireing mid to mod assist. Pt transitioned to  chair to complete oral care with s/u assist.                 Toilet Transfer: Minimal assistance Toilet Transfer Details (indicate cue type and reason): Simulated via ambulatory transfer from chair to sink and back with Minimal to moderate assist.                                   Cognition Arousal/Alertness: Awake/alert Behavior During Therapy: WFL for tasks assessed/performed Overall Cognitive Status: History of cognitive impairments - at baseline                                          Exercises     Shoulder Instructions       General Comments      Pertinent Vitals/ Pain       Pain Assessment: No/denies pain  Home Living Family/patient expects to be discharged to:: Private residence Living Arrangements: Spouse/significant other Available Help at Discharge: Family;Available 24 hours/day;Personal care attendant Type of Home: House Home Access: Stairs to enter [employer] of Steps: 2 - very wide/deep Entrance Stairs-Rails: None Home Layout: One level     Bathroom Shower/Tub: Teacher, early years/pre: Standard Bathroom Accessibility: Yes   Home Equipment: Environmental consultant - 2 wheels;Cane - single point;Bedside commode;Shower seat;Wheelchair - [employer] - quad  Prior Functioning/Environment Level of Independence: Needs assistance  Gait / Transfers Assistance Needed: spouse reports pt ambulates with quad cane and gait belt with spouse or aide steadying pt while she ambulates throughout the home, [facility] for community distances. Husband rolls her to table in the [facility] in the mornings ADL's / Homemaking Assistance Needed: spouse reports he or aide assists pt with bathing and dressing, pt can feed self. Has aide 7 hrs/day, 5 days/week   Comments:  (Taken from document review.)   Frequency  Min 2X/week        Progress Toward Goals  OT Goals(current goals can now be found in the care plan section)  Progress  towards OT goals: Progressing toward goals  Acute Rehab OT Goals Patient Stated Goal: to get stronger and go home OT Goal Formulation: With patient Time For Goal Achievement: 07/11/20 Potential to Achieve Goals: Good ADL Goals Pt Will Perform Grooming: with set-up;sitting Pt Will Perform Upper Body Dressing: with min assist;sitting Pt Will Transfer to Toilet: with supervision;stand pivot transfer;bedside commode Pt Will Perform Toileting - Clothing Manipulation and hygiene: with supervision;sitting/lateral leans;sit to/from stand  Plan Discharge plan remains appropriate                                    End of Session Equipment Utilized During Treatment: Gait belt (quad cane)  OT Visit Diagnosis: Muscle weakness (generalized) (M62.81)   Activity Tolerance Patient tolerated treatment well   Patient Left in chair;with family/visitor present;with call bell/phone within reach     Time: 8875-7972 OT Time Calculation (min): 15 min  Charges: OT General Charges $OT Visit: 1 Visit OT Treatments $Self Care/Home Management : 8-22 mins  Aanchal Cope OT, MOT    Larey Seat 06/28/2020, 10:55 AM

## 2020-06-28 NOTE — Discharge Summary (Signed)
Physician Discharge Summary  Danielle [name] ZSW:[id number] DOB: 03-21-1932 DOA: 06/26/2020  PCP: Danielle Mayo, NP  Admit date: 06/26/2020 Discharge date: 06/28/2020  Admitted From: Home  Disposition:  Home   Recommendations for Outpatient Follow-up and new medication changes:  1. Follow up with Dr. Jerelene Redden.  2. Continue antibiotic therapy with cephalexin for 4 more days.   Home Health: yes   Equipment/Devices: no    Discharge Condition: stable  CODE STATUS: full  Diet recommendation: heart healthy   Brief/Interim Summary: Danielle [name] was admitted to the hospital with the working diagnosis of focal neurologic deficit, to rule out ischemic event, in the setting of hypoglycemia and  Urinary tract infection.   87 year old female past medical history for hypertension, dyslipidemia, meningioma, type 2 diabetes mellitus, hypothyroidism, CVA with left residual deficit who presented with acute right-sided weakness and confusion (differnet from her baseline).  Positive dysuria for several days. Symptom onset about 12 hours prior to hospitalization.  On her initial physical examination blood pressure 153/66, heart rate 86, respiratory rate 15, temperature 98, oxygen saturation 94%, lungs clear to auscultation bilaterally, heart S1-S2, present rhythm, soft abdomen, no lower extremity edema, positive new right-sided upper and lower extremity weakness, unchanged left-sided residual weakness.  Sodium 139, potassium 3.8, chloride 109, bicarb 23, glucose 62, BUN 24, creatinine 0.80, white count 7.8, hemoglobin 12.0, hematocrit 36.6, platelets 396. SARS COVID-19 negative. Urine analysis specific gravity 1.009, 21-50 white cells, 0-5 red cells.  Large leukocytes.  Glucose > 500.  Head CT with no acute intracranial changes, chronic right MCA infarct, meningioma along the falx.  CT angiography with large vessel occlusion involving the left A2-A3 segment, chronic right ICA occlusion.  High-grade  narrowing of the left carotid bulb/ICA origin with distal reconstitution.  9 mm infarct core involving the bifrontal region.  EKG 90 bpm, left axis deviation, left bundle branch block, positive PVCs, sinus rhythm, no significant ST segment T wave changes.  Further work up with brain MRI negative for CVA.   1.  Acute metabolic encephalopathy, multifactorial, including hypoglycemia and acute urinary tract infection.  Patient was admitted to the medical ward, she was placed on a remote telemetry monitor.  Received neurochecks, physical therapy, occupational therapy and speech therapy.  Her symptoms clinically improved, at discharge she is back to her baseline. Further work-up with EEG was negative for seizures, brain MRI negative for acute CVA. Continue outpatient home health services.  For her urinary tract infection patient received ceftriaxone, will finish therapy with outpatient cephalexin. Close monitoring of capillary glucose  as an outpatient.  2.  Type 2 diabetes mellitus, uncontrolled, hypoglycemia/hyperglycemia.  Dyslipidemia. Patient received insulin therapy during her hospitalization, her home dose was reduced to avoid hypoglycemia. Continue atorvastatin.  At discharge she will resume her insulin regimen with caution.  Her fasting discharge glucose is 240.  3.  Hypertension.  Continue blood pressure control with amlodipine.  4.  Hypothyroidism.  Continue levothyroxine.  5.  B12 deficiency.  Continue supplementation.  6.  History of ischemic CVA with left hemiparesis.  Continue atorvastatin and clopidogrel.   Discharge Diagnoses:  Principal Problem:   Acute metabolic encephalopathy Active Problems:   Uncontrolled type 2 diabetes mellitus with diabetic nephropathy, with long-term current use of insulin (HCC)   Hypothyroidism (acquired)   Hypertension   Carotid stenosis, bilateral   Osteoporosis   Hyperlipidemia   History of CVA with residual deficit   Acute lower  UTI   Type 2 diabetes mellitus with hypoglycemia without  coma ([place])   Metabolic encephalopathy    Discharge Instructions   Allergies as of 06/28/2020      Reactions   Fosamax [alendronate Sodium] Other (See Comments)   MUSCLE ACHES   Prednisone    Feels jittery and nauseous      Medication List    STOP taking these medications   methocarbamol 500 MG tablet Commonly known as: ROBAXIN   oxyCODONE 5 MG immediate release tablet Commonly known as: Oxy IR/ROXICODONE   senna-docusate 8.6-50 MG tablet Commonly known as: Senokot-S     TAKE these medications   acetaminophen 325 MG tablet Commonly known as: TYLENOL Take 2 tablets (650 mg total) by mouth every 6 (six) hours as needed for mild pain (or Fever >/= 101).   amLODipine 5 MG tablet Commonly known as: NORVASC TAKE 1 TABLET BY MOUTH EVERY DAY   atorvastatin 80 MG tablet Commonly known as: LIPITOR Take 1 tablet (80 mg total) by mouth daily at 6 PM.   CALCIUM 600 + D PO Take 1 tablet by mouth daily.   cephALEXin 500 MG capsule Commonly known as: KEFLEX Take 1 capsule (500 mg total) by mouth every 12 (twelve) hours for 5 days.   clopidogrel 75 MG tablet Commonly known as: PLAVIX TAKE 1 TABLET BY MOUTH EVERY DAY   diclofenac sodium 1 % Gel Commonly known as: VOLTAREN Apply 2 g topically 4 (four) times daily.   glucose blood test strip Use as instructed check blood sugar three times daily as needed for machine pt already has   HumaLOG KwikPen 200 UNIT/ML KwikPen Generic drug: insulin lispro Inject 10-12 Units into the skin in the morning and at bedtime.   levothyroxine 100 MCG tablet Commonly known as: SYNTHROID TAKE 1 TABLET BY MOUTH EVERY DAY IN THE MORNING What changed: See the new instructions.   OCUVITE ADULT 50+ PO Take 1 tablet by mouth daily.   Toujeo Max SoloStar 300 UNIT/ML Solostar Pen Generic drug: insulin glargine (2 Unit Dial) Inject 32 Units into the skin daily before breakfast.    vitamin B-12 1000 MCG tablet Commonly known as: CYANOCOBALAMIN Take 1 tablet (1,000 mcg total) by mouth daily.       Follow-up Information    [facility] Follow up.   Specialty: Rehabilitation Contact information: [place] [id number] [place] [id number] [phone]             Allergies  Allergen Reactions  . Fosamax [Alendronate Sodium] Other (See Comments)    MUSCLE ACHES  . Prednisone     Feels jittery and nauseous     Procedures/Studies: CT Code Stroke CTA Head W/WO contrast  Result Date: 06/26/2020 CLINICAL DATA:  Neuro deficit, acute, stroke suspected EXAM: CT ANGIOGRAPHY HEAD AND NECK TECHNIQUE: Multidetector CT imaging of the head and neck was performed using the standard protocol during bolus administration of intravenous contrast. Multiplanar CT image reconstructions and MIPs were obtained to evaluate the vascular anatomy. Carotid stenosis measurements (when applicable) are obtained utilizing NASCET criteria, using the distal internal carotid diameter as the denominator. CONTRAST:  144mL OMNIPAQUE IOHEXOL 350 MG/ML SOLN COMPARISON:  06/26/2020 and prior. FINDINGS: CTA NECK FINDINGS Aortic arch: Standard branching. Mild atherosclerotic calcifications. Patent great vessel origins. Right carotid system: Patent CCA. Distal CCA atherosclerotic calcifications. Severe bifurcation atheromatous disease with chronic occlusion of the ICA origin. Prior thin linear distal reconstitution is not demonstrated. Mild to moderate narrowing of the proximal ECA. Left carotid system: Patent CCA.  Carotid bifurcation atheromatous disease with at least 80-90% narrowing of the bulb and ICA origin. Distally patent cervical ICA demonstrates normal caliber. Vertebral arteries: Dominant left vertebral artery. Patent. Chronic mild narrowing of the left vertebral artery at the C3 level secondary to adjacent osteophytes. Skeleton: No acute  finding. Multilevel spondylosis. Grade 1 C4-5 anterolisthesis. Partial fusion at the C5-6 level. Right shoulder arthroplasty. Chronic right clavicle deformity. Other neck: No adenopathy or mass. Upper chest: No acute finding. Review of the MIP images confirms the above findings CTA HEAD FINDINGS Anterior circulation: Chronic right ICA occlusion to the level of the terminus. Diminutive appearance of the right MCA and ACA however patent. Patent normal caliber left ICA. Patent left MCA with mild M1 segment narrowing. Patent left A1 segment. Large vessel occlusion involving the distal left A2/proximal A3 segment. Posterior circulation: Patent V4 segments. Patent basilar and superior cerebellar arteries. Patent right PCA. Mild narrowing and irregularity of the right PCA origin and P2/P3 segments may reflect atheromatous disease. Fetal origin of the left PCA, patent. Venous sinuses: Non opacification of the left sigmoid sinus and proximal internal jugular vein may reflect mixing artifact versus chronic thrombosis. Anatomic variants: Please see above. Review of the MIP images confirms the above findings IMPRESSION: Large vessel occlusion involving the left A2/A3 segment. Chronic right ICA occlusion to the level of the terminus. Diminutive right ACA/MCA. Mild left M1 segment and right PCA narrowing. High-grade narrowing of the left carotid bulb/ICA origin with distal reconstitution, grossly unchanged. Asymmetric non opacification of the left sigmoid sinus/proximal IJ may reflect mixing artifact versus chronic thrombosis. These results were called by telephone at the time of interpretation on 06/26/2020 at 12:06 pm to provider [facility] , who verbally acknowledged these results. Electronically Signed   By: Primitivo Gauze M.D.   On: 06/26/2020 12:29   CT Code Stroke CTA Neck W/WO contrast  Result Date: 06/26/2020 CLINICAL DATA:  Neuro deficit, acute, stroke suspected EXAM: CT ANGIOGRAPHY HEAD AND NECK TECHNIQUE:  Multidetector CT imaging of the head and neck was performed using the standard protocol during bolus administration of intravenous contrast. Multiplanar CT image reconstructions and MIPs were obtained to evaluate the vascular anatomy. Carotid stenosis measurements (when applicable) are obtained utilizing NASCET criteria, using the distal internal carotid diameter as the denominator. CONTRAST:  142mL OMNIPAQUE IOHEXOL 350 MG/ML SOLN COMPARISON:  06/26/2020 and prior. FINDINGS: CTA NECK FINDINGS Aortic arch: Standard branching. Mild atherosclerotic calcifications. Patent great vessel origins. Right carotid system: Patent CCA. Distal CCA atherosclerotic calcifications. Severe bifurcation atheromatous disease with chronic occlusion of the ICA origin. Prior thin linear distal reconstitution is not demonstrated. Mild to moderate narrowing of the proximal ECA. Left carotid system: Patent CCA. Carotid bifurcation atheromatous disease with at least 80-90% narrowing of the bulb and ICA origin. Distally patent cervical ICA demonstrates normal caliber. Vertebral arteries: Dominant left vertebral artery. Patent. Chronic mild narrowing of the left vertebral artery at the C3 level secondary to adjacent osteophytes. Skeleton: No acute finding. Multilevel spondylosis. Grade 1 C4-5 anterolisthesis. Partial fusion at the C5-6 level. Right shoulder arthroplasty. Chronic right clavicle deformity. Other neck: No adenopathy or mass. Upper chest: No acute finding. Review of the MIP images confirms the above findings CTA HEAD FINDINGS Anterior circulation: Chronic right ICA occlusion to the level of the terminus. Diminutive appearance of the right MCA and ACA however patent. Patent normal caliber left ICA. Patent left MCA with mild M1 segment narrowing. Patent left A1 segment. Large vessel occlusion involving the distal left A2/proximal  A3 segment. Posterior circulation: Patent V4 segments. Patent basilar and superior cerebellar arteries.  Patent right PCA. Mild narrowing and irregularity of the right PCA origin and P2/P3 segments may reflect atheromatous disease. Fetal origin of the left PCA, patent. Venous sinuses: Non opacification of the left sigmoid sinus and proximal internal jugular vein may reflect mixing artifact versus chronic thrombosis. Anatomic variants: Please see above. Review of the MIP images confirms the above findings IMPRESSION: Large vessel occlusion involving the left A2/A3 segment. Chronic right ICA occlusion to the level of the terminus. Diminutive right ACA/MCA. Mild left M1 segment and right PCA narrowing. High-grade narrowing of the left carotid bulb/ICA origin with distal reconstitution, grossly unchanged. Asymmetric non opacification of the left sigmoid sinus/proximal IJ may reflect mixing artifact versus chronic thrombosis. These results were called by telephone at the time of interpretation on 06/26/2020 at 12:06 pm to provider [facility] , who verbally acknowledged these results. Electronically Signed   By: Primitivo Gauze M.D.   On: 06/26/2020 12:29   MR BRAIN WO CONTRAST  Result Date: 06/26/2020 CLINICAL DATA:  Acute neuro deficit.  Rule out stroke. EXAM: MRI HEAD WITHOUT CONTRAST TECHNIQUE: Multiplanar, multiecho pulse sequences of the brain and surrounding structures were obtained without intravenous contrast. COMPARISON:  MRI head 12/29/2018 FINDINGS: Brain: Negative for acute infarct Chronic right MCA infarct. Chronic hemorrhagic infarction in the right basal ganglia unchanged. Left frontal para falcine mass unchanged from prior studies. Mass measures approximately 21 x 29 mm compatible with meningioma. No adjacent brain edema. Vascular: Loss of flow void right internal carotid artery compatible with chronic occlusion unchanged from prior studies. Otherwise normal arterial flow voids. Skull and upper cervical spine: Negative Sinuses/Orbits: Mild mucosal edema paranasal sinuses. Bilateral cataract  extraction Other: None IMPRESSION: Negative for acute infarct. Chronic right MCA infarct Left frontal para falcine meningioma unchanged from prior studies. Electronically Signed   By: Franchot Gallo M.D.   On: 06/26/2020 13:44   CT Code Stroke Cerebral Perfusion with contrast  Result Date: 06/26/2020 CLINICAL DATA:  Neuro deficit, acute, stroke suspected EXAM: CT PERFUSION BRAIN TECHNIQUE: Multiphase CT imaging of the brain was performed following IV bolus contrast injection. Subsequent parametric perfusion maps were calculated using RAPID software. CONTRAST:  172mL OMNIPAQUE IOHEXOL 350 MG/ML SOLN COMPARISON:  06/26/2020 and prior. FINDINGS: CT Brain Perfusion Findings: CBF (<30%) Volume: 24mL Perfusion (Tmax>6.0s) volume: 68mL Mismatch Volume: 33mL ASPECTS on noncontrast CT Head: 10 at 10:40 a.m. today. Infarct Core: 9 mL Infarction Location:Bilateral frontal lobes. IMPRESSION: 9 mL infarct core involving the bifrontal region. Electronically Signed   By: Primitivo Gauze M.D.   On: 06/26/2020 11:56   EEG adult  Result Date: 06/27/2020 Danielle Havens, MD     06/27/2020  4:22 PM Patient Name: Danielle [name] MRN: [id number] Epilepsy Attending: Lora [name] Referring Physician/Provider: Dr Sander Radon Date: 06/27/2020 Duration: 24.50 mins Patient history: 87yo F with ams. EEG to evaluate for seizure Level of alertness: Awake AEDs during EEG study: None Technical aspects: This EEG study was done with scalp electrodes positioned according to the 10-20 International system of electrode placement. Electrical activity was acquired at a sampling rate of 500Hz  and reviewed with a high frequency filter of 70Hz  and a low frequency filter of 1Hz . EEG data were recorded continuously and digitally stored. Description: The posterior dominant rhythm consists of 8-9 Hz activity of moderate voltage (25-35 uV) seen predominantly in posterior head regions, symmetric and reactive to eye opening and eye closing. EEG  showed intermittent 3 to 4 Hz delta slowing in left temporal region. Hyperventilation and photic stimulation were not performed.   ABNORMALITY -Intermittent slow, left temporal region IMPRESSION: This study is suggestive of nonspecific cortical dysfunction in left temporal region. No seizures or epileptiform discharges were seen throughout the recording. Danielle [name]   ECHOCARDIOGRAM COMPLETE  Result Date: 06/26/2020    ECHOCARDIOGRAM REPORT   Patient Name:   Danielle [name] Date of Exam: 06/26/2020 Medical Rec #:  [id number]           Height:       63.0 in Accession #:    [id number]          Weight:       151.3 lb Date of Birth:  16-May-1932           BSA:          1.717 m Patient Age:    87 years            BP:           134/78 mmHg Patient Gender: F                   HR:           90 bpm. Exam Location:  [facility] Procedure: 2D Echo Indications:    Stroke 434.91 / I163.9  History:        Patient has prior history of Echocardiogram examinations, most                 recent 12/24/2018. Stroke; Risk Factors:Former Smoker,                 Dyslipidemia, Diabetes and Hypertension.  Sonographer:    Leavy Cella RDCS (AE) Referring Phys: [id number] [place]  1. Left ventricular ejection fraction, by estimation, is 60 to 65%. The left ventricle has normal function. The left ventricle has no regional wall motion abnormalities. There is moderate left ventricular hypertrophy. Left ventricular diastolic parameters are indeterminate.  2. Right ventricular systolic function is normal. The right ventricular size is normal.  3. The mitral valve is normal in structure. No evidence of mitral valve regurgitation. No evidence of mitral stenosis.  4. The aortic valve has an indeterminant number of cusps. There is moderate calcification of the aortic valve. There is moderate thickening of the aortic valve. Aortic valve regurgitation is not visualized. No aortic stenosis is present.  5. The inferior vena cava  is normal in size with greater than 50% respiratory variability, suggesting right atrial pressure of 3 mmHg. FINDINGS  Left Ventricle: Left ventricular ejection fraction, by estimation, is 60 to 65%. The left ventricle has normal function. The left ventricle has no regional wall motion abnormalities. The left ventricular internal cavity size was normal in size. There is  moderate left ventricular hypertrophy. Left ventricular diastolic parameters are indeterminate. Right Ventricle: The right ventricular size is normal. No increase in right ventricular wall thickness. Right ventricular systolic function is normal. Left Atrium: Left atrial size was normal in size. Right Atrium: Right atrial size was normal in size. Pericardium: There is no evidence of pericardial effusion. Mitral Valve: The mitral valve is normal in structure. There is mild thickening of the mitral valve leaflet(s). There is mild calcification of the mitral valve leaflet(s). Mild mitral annular calcification. No evidence of mitral valve regurgitation. No evidence of mitral valve stenosis. Tricuspid Valve: The tricuspid valve is normal in structure. Tricuspid valve regurgitation is  not demonstrated. No evidence of tricuspid stenosis. Aortic Valve: The aortic valve has an indeterminant number of cusps. There is moderate calcification of the aortic valve. There is moderate thickening of the aortic valve. There is moderate aortic valve annular calcification. Aortic valve regurgitation is not visualized. No aortic stenosis is present. Aortic valve mean gradient measures 8.7 mmHg. Aortic valve peak gradient measures 14.4 mmHg. Aortic valve area, by VTI measures 1.70 cm. Pulmonic Valve: The pulmonic valve was not well visualized. Pulmonic valve regurgitation is not visualized. No evidence of pulmonic stenosis. Aorta: The aortic root is normal in size and structure. Pulmonary Artery: Indeterminant PASP, inadequate TR jet. Venous: The inferior vena cava is  normal in size with greater than 50% respiratory variability, suggesting right atrial pressure of 3 mmHg. IAS/Shunts: No atrial level shunt detected by color flow Doppler.  LEFT VENTRICLE PLAX 2D LVIDd:         3.91 cm  Diastology LVIDs:         2.50 cm  LV e' medial:    9.68 cm/s LV PW:         1.32 cm  LV E/e' medial:  12.5 LV IVS:        1.44 cm  LV e' lateral:   10.80 cm/s LVOT diam:     2.00 cm  LV E/e' lateral: 11.2 LV SV:         66 LV SV Index:   38 LVOT Area:     3.14 cm  RIGHT VENTRICLE RV S prime:     10.70 cm/s TAPSE (M-mode): 1.8 cm LEFT ATRIUM             Index       RIGHT ATRIUM           Index LA diam:        3.50 cm 2.04 cm/m  RA Area:     16.80 cm LA Vol (A2C):   55.3 ml 32.20 ml/m RA Volume:   48.60 ml  28.30 ml/m LA Vol (A4C):   25.5 ml 14.85 ml/m LA Biplane Vol: 41.3 ml 24.05 ml/m  AORTIC VALVE AV Area (Vmax):    1.82 cm AV Area (Vmean):   1.70 cm AV Area (VTI):     1.70 cm AV Vmax:           189.81 cm/s AV Vmean:          139.212 cm/s AV VTI:            0.386 m AV Peak Grad:      14.4 mmHg AV Mean Grad:      8.7 mmHg LVOT Vmax:         109.67 cm/s LVOT Vmean:        75.506 cm/s LVOT VTI:          0.209 m LVOT/AV VTI ratio: 0.54  AORTA Ao Root diam: 2.40 cm MITRAL VALVE MV Area (PHT): 3.58 cm     SHUNTS MV Decel Time: 212 msec     Systemic VTI:  0.21 m MV E velocity: 121.00 cm/s  Systemic Diam: 2.00 cm MV A velocity: 31.20 cm/s MV E/A ratio:  3.88 Danielle Dolly MD Electronically signed by Danielle Dolly MD Signature Date/Time: 06/26/2020/3:40:40 PM    Final    CT HEAD CODE STROKE WO CONTRAST`  Result Date: 06/26/2020 CLINICAL DATA:  Code stroke. Decreased level of consciousness, unable to move both legs EXAM: CT HEAD WITHOUT CONTRAST TECHNIQUE: Contiguous axial images were obtained  from the base of the skull through the vertex without intravenous contrast. COMPARISON:  MRI and CT from 2020 FINDINGS: Brain: There is no acute intracranial hemorrhage. No acute appearing loss of  gray-white differentiation. There are chronic infarcts of the right MCA territory involving frontoparietal lobes and insula. Ex vacuo dilatation of the adjacent right lateral ventricle. Additional areas of hypoattenuation in the supratentorial white matter are nonspecific but probably reflect stable chronic microvascular ischemic changes. Hyperdense dural-based mass along the left aspect of the anterior falx is again identified is most consistent with a meningioma. Size is similar and there is no underlying edema. Vascular: No hyperdense vessel. There is intracranial atherosclerotic calcification at the skull base. Skull: Unremarkable. Sinuses/Orbits: No acute abnormality Other: Mastoid air cells are clear. ASPECTS (Morgan Stroke Program Early CT Score) - Ganglionic level infarction (caudate, lentiform nuclei, internal capsule, insula, M1-M3 cortex): 7 - Supraganglionic infarction (M4-M6 cortex): 3 Total score (0-10 with 10 being normal): 10 IMPRESSION: No acute intracranial hemorrhage or evidence of acute infarction. Chronic right MCA territory infarcts. Similar meningioma along the falx. These results were called by telephone at the time of interpretation on 06/26/2020 at 10:40 am to provider [facility] , who verbally acknowledged these results. Electronically Signed   By: Macy Mis M.D.   On: 06/26/2020 10:44        Subjective: Patient is feeling better, no nausea or vomiting, right sided neurologic deficit has resolved, no chest pain or dyspnea.   Discharge Exam: Vitals:   06/28/20 0030 06/28/20 0456  BP: 132/86 (!) 131/56  Pulse: 78 85  Resp: 18 18  Temp: 98 F (36.7 C) 98.8 F (37.1 C)  SpO2: 98% 100%   Vitals:   06/27/20 1323 06/27/20 1944 06/28/20 0030 06/28/20 0456  BP: (!) 161/65 (!) 149/86 132/86 (!) 131/56  Pulse: 89 87 78 85  Resp: 18 16 18 18   Temp: 97.8 F (36.6 C) 98.2 F (36.8 C) 98 F (36.7 C) 98.8 F (37.1 C)  TempSrc: Oral     SpO2: 100% 98% 98% 100%   Weight:      Height:        General: Not in pain or dyspnea  Neurology: Awake and alert, left sided hemiparesis (chronic) E ENT: no pallor, no icterus, oral mucosa moist Cardiovascular: No JVD. S1-S2 present, rhythmic, no gallops, rubs, or murmurs. No lower extremity edema. Pulmonary: positive breath sounds bilaterally, adequate air movement, no wheezing, rhonchi or rales. Gastrointestinal. Abdomen soft and non tender Skin. No rashes Musculoskeletal: no joint deformities   The results of significant diagnostics from this hospitalization (including imaging, microbiology, ancillary and laboratory) are listed below for reference.     Microbiology: Recent Results (from the past 240 hour(s))  Resp Panel by RT-PCR (Flu A&B, Covid) Nasopharyngeal Swab     Status: None   Collection Time: 06/26/20 10:27 AM   Specimen: Nasopharyngeal Swab; Nasopharyngeal(NP) swabs in vial transport medium  Result Value Ref Range Status   SARS Coronavirus 2 by RT PCR NEGATIVE NEGATIVE Final    Comment: (NOTE) SARS-CoV-2 target nucleic acids are NOT DETECTED.  The SARS-CoV-2 RNA is generally detectable in upper respiratory specimens during the acute phase of infection. The lowest concentration of SARS-CoV-2 viral copies this assay can detect is 138 copies/mL. A negative result does not preclude SARS-Cov-2 infection and should not be used as the sole basis for treatment or other patient management decisions. A negative result may occur with  improper specimen collection/handling, submission of specimen other  than nasopharyngeal swab, presence of viral mutation(s) within the areas targeted by this assay, and inadequate number of viral copies(<138 copies/mL). A negative result must be combined with clinical observations, patient history, and epidemiological information. The expected result is Negative.  Fact Sheet for Patients:  EntrepreneurPulse.com.au  Fact Sheet for Healthcare  Providers:  IncredibleEmployment.be  This test is no t yet approved or cleared by the [place] FDA and  has been authorized for detection and/or diagnosis of SARS-CoV-2 by FDA under an Emergency Use Authorization (EUA). This EUA will remain  in effect (meaning this test can be used) for the duration of the COVID-19 declaration under Section 564(b)(1) of the Act, 21 U.S.C.section 360bbb-3(b)(1), unless the authorization is terminated  or revoked sooner.       Influenza A by PCR NEGATIVE NEGATIVE Final   Influenza B by PCR NEGATIVE NEGATIVE Final    Comment: (NOTE) The Xpert Xpress SARS-CoV-2/FLU/RSV plus assay is intended as an aid in the diagnosis of influenza from Nasopharyngeal swab specimens and should not be used as a sole basis for treatment. Nasal washings and aspirates are unacceptable for Xpert Xpress SARS-CoV-2/FLU/RSV testing.  Fact Sheet for Patients: EntrepreneurPulse.com.au  Fact Sheet for Healthcare Providers: IncredibleEmployment.be  This test is not yet approved or cleared by the [place] FDA and has been authorized for detection and/or diagnosis of SARS-CoV-2 by FDA under an Emergency Use Authorization (EUA). This EUA will remain in effect (meaning this test can be used) for the duration of the COVID-19 declaration under Section 564(b)(1) of the Act, 21 U.S.C. section 360bbb-3(b)(1), unless the authorization is terminated or revoked.  Performed at [facility], [address] [place], [place] [address]      Labs: BNP (last 3 results) No results for input(s): BNP in the last 8760 hours. Basic Metabolic Panel: Recent Labs  Lab 06/26/20 1031 06/28/20 0653  NA 139 137  K 3.8 4.0  CL 109 105  CO2 23 24  GLUCOSE 62* 240*  BUN 24* 15  CREATININE 0.80 0.71  CALCIUM 9.3 9.5   Liver Function Tests: Recent Labs  Lab 06/26/20 1031  AST 46*  ALT 26  ALKPHOS 96  BILITOT 0.7  PROT 6.5   ALBUMIN 3.5   No results for input(s): LIPASE, AMYLASE in the last 168 hours. No results for input(s): AMMONIA in the last 168 hours. CBC: Recent Labs  Lab 06/26/20 1031 06/28/20 0653  WBC 7.8 9.3  NEUTROABS 4.9 5.6  HGB 12.0 12.8  HCT 36.6 40.5  MCV 88.0 89.4  PLT 306 307   Cardiac Enzymes: No results for input(s): CKTOTAL, CKMB, CKMBINDEX, TROPONINI in the last 168 hours. BNP: Invalid input(s): POCBNP CBG: Recent Labs  Lab 06/27/20 1119 06/27/20 1623 06/27/20 1647 06/27/20 2141 06/28/20 0733  GLUCAP 294* 224* 291* 235* 229*   D-Dimer No results for input(s): DDIMER in the last 72 hours. Hgb A1c Recent Labs    06/26/20 1031  HGBA1C 7.6*   Lipid Profile Recent Labs    06/27/20 0537  CHOL 197  HDL 71  LDLCALC 107*  TRIG 95  CHOLHDL 2.8   Thyroid function studies Recent Labs    06/26/20 1031  TSH 0.049*   Anemia work up Recent Labs    06/26/20 1356  VITAMINB12 1,443*   Urinalysis    Component Value Date/Time   COLORURINE YELLOW 06/26/2020 [place] 06/26/2020 1027   LABSPEC 1.009 06/26/2020 1027   PHURINE 6.0 06/26/2020 1027   GLUCOSEU >=500 (  A) 06/26/2020 1027   HGBUR NEGATIVE 06/26/2020 1027   [place] 06/26/2020 1027   KETONESUR NEGATIVE 06/26/2020 1027   PROTEINUR NEGATIVE 06/26/2020 1027   NITRITE NEGATIVE 06/26/2020 1027   LEUKOCYTESUR LARGE (A) 06/26/2020 1027   Sepsis Labs Invalid input(s): PROCALCITONIN,  WBC,  LACTICIDVEN Microbiology Recent Results (from the past 240 hour(s))  Resp Panel by RT-PCR (Flu A&B, Covid) Nasopharyngeal Swab     Status: None   Collection Time: 06/26/20 10:27 AM   Specimen: Nasopharyngeal Swab; Nasopharyngeal(NP) swabs in vial transport medium  Result Value Ref Range Status   SARS Coronavirus 2 by RT PCR NEGATIVE NEGATIVE Final    Comment: (NOTE) SARS-CoV-2 target nucleic acids are NOT DETECTED.  The SARS-CoV-2 RNA is generally detectable in upper  respiratory specimens during the acute phase of infection. The lowest concentration of SARS-CoV-2 viral copies this assay can detect is 138 copies/mL. A negative result does not preclude SARS-Cov-2 infection and should not be used as the sole basis for treatment or other patient management decisions. A negative result may occur with  improper specimen collection/handling, submission of specimen other than nasopharyngeal swab, presence of viral mutation(s) within the areas targeted by this assay, and inadequate number of viral copies(<138 copies/mL). A negative result must be combined with clinical observations, patient history, and epidemiological information. The expected result is Negative.  Fact Sheet for Patients:  EntrepreneurPulse.com.au  Fact Sheet for Healthcare Providers:  IncredibleEmployment.be  This test is no t yet approved or cleared by the [place] FDA and  has been authorized for detection and/or diagnosis of SARS-CoV-2 by FDA under an Emergency Use Authorization (EUA). This EUA will remain  in effect (meaning this test can be used) for the duration of the COVID-19 declaration under Section 564(b)(1) of the Act, 21 U.S.C.section 360bbb-3(b)(1), unless the authorization is terminated  or revoked sooner.       Influenza A by PCR NEGATIVE NEGATIVE Final   Influenza B by PCR NEGATIVE NEGATIVE Final    Comment: (NOTE) The Xpert Xpress SARS-CoV-2/FLU/RSV plus assay is intended as an aid in the diagnosis of influenza from Nasopharyngeal swab specimens and should not be used as a sole basis for treatment. Nasal washings and aspirates are unacceptable for Xpert Xpress SARS-CoV-2/FLU/RSV testing.  Fact Sheet for Patients: EntrepreneurPulse.com.au  Fact Sheet for Healthcare Providers: IncredibleEmployment.be  This test is not yet approved or cleared by the [place] FDA and has been  authorized for detection and/or diagnosis of SARS-CoV-2 by FDA under an Emergency Use Authorization (EUA). This EUA will remain in effect (meaning this test can be used) for the duration of the COVID-19 declaration under Section 564(b)(1) of the Act, 21 U.S.C. section 360bbb-3(b)(1), unless the authorization is terminated or revoked.  Performed at [facility], [address], [place], [place] [address]      Time coordinating discharge: 45 minutes  SIGNED:   Tawni Millers, MD  [facility] 06/28/2020, 8:34 AM

## 2020-06-29 ENCOUNTER — Telehealth: Payer: Self-pay

## 2020-06-29 NOTE — Telephone Encounter (Signed)
Transition Care Management Follow-up Telephone Call  Date of discharge and from where: 06/28/20 from [facility]  How have you been since you were released from the hospital? Pt states that she is feeling well today and has not pain or other complaints.   Any questions or concerns? No  Items Reviewed:  Did the pt receive and understand the discharge instructions provided? Yes   Medications obtained and verified? Yes   Other? No   Any new allergies since your discharge? No   Dietary orders reviewed? Heart Healthy  Do you have support at home? Yes   Functional Questionnaire: (I = Independent and D = Dependent) ADLs: I  Bathing/Dressing- D  Meal Prep- I  Eating- I  Maintaining continence- I  Transferring/Ambulation- I  Managing Meds- I  Follow up appointments reviewed:   PCP Hospital f/u appt confirmed? No  Pt encouraged to call office and schedule hospital follow up appt.   Are transportation arrangements needed? No  If their condition worsens, is the pt aware to call PCP or go to the Emergency Dept.? Yes Was the patient provided with contact information for the PCP's office or ED? Yes Was to pt encouraged to call back with questions or concerns? Yes

## 2020-07-09 ENCOUNTER — Encounter: Payer: Self-pay | Admitting: Internal Medicine

## 2020-07-09 ENCOUNTER — Other Ambulatory Visit: Payer: Self-pay

## 2020-07-09 ENCOUNTER — Ambulatory Visit (INDEPENDENT_AMBULATORY_CARE_PROVIDER_SITE_OTHER): Payer: Medicare Other | Admitting: Internal Medicine

## 2020-07-09 VITALS — BP 144/74 | HR 65 | Temp 98.0°F | Ht 65.0 in | Wt 134.0 lb

## 2020-07-09 DIAGNOSIS — I10 Essential (primary) hypertension: Secondary | ICD-10-CM | POA: Diagnosis not present

## 2020-07-09 DIAGNOSIS — R5381 Other malaise: Secondary | ICD-10-CM

## 2020-07-09 DIAGNOSIS — N390 Urinary tract infection, site not specified: Secondary | ICD-10-CM

## 2020-07-09 DIAGNOSIS — Z09 Encounter for follow-up examination after completed treatment for conditions other than malignant neoplasm: Secondary | ICD-10-CM | POA: Diagnosis not present

## 2020-07-09 NOTE — Patient Instructions (Signed)
Please continue to take medications as prescribed.  Please increase water intake to at least 55 ounces per day to avoid any dehydration.  You are being referred to Outpatient physical therapy.

## 2020-07-09 NOTE — Progress Notes (Signed)
Established Patient Office Visit  Subjective:  Patient ID: Danielle [name], female    DOB: 1931-07-03  Age: 87 y.o. MRN: [id number]  CC:  Chief Complaint  Patient presents with  . Transitions Of Care    HPI Danielle [name] is an 87 year old female with past medical history of uncontrolled type II DM, hypertension, CVA with residual left UE numbness, hypothyroidism and osteoporosis who presents after being discharged from hospital for UTI.  She had presented to the hospital with acute metabolic encephalopathy. She had workup to r/o acute CVA. She was found to have UTI, treated with Rocephin and outpatient cephalexin. Her dysuria and mental condition has improved. She denies any hematuria. Denies fever, chills, nausea or vomiting.  Past Medical History:  Diagnosis Date  . Arthritis    "some joint pain once in awhile" (05/26/2017)  . Back pain at L4-L5 level 06/14/2013  . Cataract   . CVA (cerebral vascular accident) ([place]) 2019  . Encounter for screening mammogram for malignant neoplasm of breast 04/19/2019  . Generalized weakness 06/09/2018  . Heart murmur   . History of kidney stones   . Hyperlipemia   . Hypertension   . Hypothyroidism (acquired)   . Muscle weakness (generalized) 12/17/2012  . Osteoporosis   . Pain in joint, shoulder region 12/17/2012  . Pneumothorax 02/05/2017   right-after fall  . Proximal humerus fracture 11/29/2012  . Rib fractures 02/05/2017   right side  . 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 Shoulder fracture 12/14/2012  . Stroke ([place])   . Stroke-like symptoms 12/23/2018  . Type II diabetes mellitus ([place])     Past Surgical History:  Procedure Laterality Date  . AUGMENTATION MAMMAPLASTY Bilateral   . COSMETIC SURGERY    . DILATION AND CURETTAGE OF UTERUS    . EYE SURGERY    . FRACTURE SURGERY    . JOINT REPLACEMENT    . REVERSE SHOULDER ARTHROPLASTY Right 05/26/2017  . REVERSE SHOULDER ARTHROPLASTY Right 05/26/2017   Procedure:  REVERSE RIGHT SHOULDER ARTHROPLASTY;  Surgeon: Meredith Pel, MD;  Location: [facility];  Service: Orthopedics;  Laterality: Right;  . TONSILLECTOMY    . TUBAL LIGATION      Family History  Problem Relation Age of Onset  . Cancer Mother   . Heart disease Father     Social History   Socioeconomic History  . Marital status: Married    Spouse name: [name]   . Number of children: 2  . Years of education: Not on file  . Highest education level: Not on file  Occupational History  . Occupation: retired  Tobacco Use  . Smoking status: Former Smoker    Packs/day: 1.00    Years: 40.00    Pack years: 40.00    Types: Cigarettes    Quit date: 1991    Years since quitting: 31.1  . Smokeless tobacco: Never Used  Vaping Use  . Vaping Use: Never used  Substance and Sexual Activity  . Alcohol use: No  . Drug use: No  . Sexual activity: Yes  Other Topics Concern  . Not on file  Social History Narrative   Lives  with husband      Catalina Antigua and [name] -children; lives close by   5 grandchildren, 2 great grandchildren      Enjoys: antiquing, shopping, beach and mountains      Diet: eats all food groups   Caffeine: coffee and coke -drinks for low sugar  Water: 4-5 cups daily       Wears seat belt, is not able to drive well   No Geophysical data processor at home    Social Determinants of Health   Financial Resource Strain: Low Risk   . Difficulty of Paying Living Expenses: Not hard at all  Food Insecurity: No Food Insecurity  . Worried About Charity fundraiser in the Last Year: Never true  . Ran Out of Food in the Last Year: Never true  Transportation Needs: No Transportation Needs  . Lack of Transportation (Medical): No  . Lack of Transportation (Non-Medical): No  Physical Activity: Inactive  . Days of Exercise per Week: 0 days  . Minutes of Exercise per Session: 0 min  Stress: No Stress Concern Present  . Feeling of Stress : Not at all  Social Connections: Moderately  Isolated  . Frequency of Communication with Friends and Family: Three times a week  . Frequency of Social Gatherings with Friends and Family: Once a week  . Attends Religious Services: Never  . Active Member of Clubs or Organizations: No  . Attends Archivist Meetings: Never  . Marital Status: Married  Human resources officer Violence: Not At Risk  . Fear of Current or Ex-Partner: No  . Emotionally Abused: No  . Physically Abused: No  . Sexually Abused: No    Outpatient Medications Prior to Visit  Medication Sig Dispense Refill  . acetaminophen (TYLENOL) 325 MG tablet Take 2 tablets (650 mg total) by mouth every 6 (six) hours as needed for mild pain (or Fever >/= 101).    Marland Kitchen amLODipine (NORVASC) 5 MG tablet TAKE 1 TABLET BY MOUTH EVERY DAY (Patient taking differently: Take 5 mg by mouth daily.) 90 tablet 3  . atorvastatin (LIPITOR) 80 MG tablet Take 1 tablet (80 mg total) by mouth daily at 6 PM. 90 tablet 1  . Calcium Carb-Cholecalciferol (CALCIUM 600 + D PO) Take 1 tablet by mouth daily.    . clopidogrel (PLAVIX) 75 MG tablet TAKE 1 TABLET BY MOUTH EVERY DAY (Patient taking differently: Take 75 mg by mouth daily.) 90 tablet 4  . diclofenac sodium (VOLTAREN) 1 % GEL Apply 2 g topically 4 (four) times daily. 2 g 0  . glucose blood test strip Use as instructed check blood sugar three times daily as needed for machine pt already has 100 each 12  . insulin glargine, 2 Unit Dial, (TOUJEO MAX SOLOSTAR) 300 UNIT/ML Solostar Pen Inject 32 Units into the skin daily before breakfast. 3 mL 2  . Insulin Lispro (HUMALOG KWIKPEN) 200 UNIT/ML SOPN Inject 10-12 Units into the skin in the morning and at bedtime.    Marland Kitchen levothyroxine (SYNTHROID) 100 MCG tablet TAKE 1 TABLET BY MOUTH EVERY DAY IN THE MORNING (Patient taking differently: Take 100 mcg by mouth in the morning.) 60 tablet 2  . Multiple Vitamins-Minerals (OCUVITE ADULT 50+ PO) Take 1 tablet by mouth daily.     . vitamin B-12 (CYANOCOBALAMIN) 1000  MCG tablet Take 1 tablet (1,000 mcg total) by mouth daily. 30 tablet 0   No facility-administered medications prior to visit.    Allergies  Allergen Reactions  . Fosamax [Alendronate Sodium] Other (See Comments)    MUSCLE ACHES  . Prednisone     Feels jittery and nauseous    ROS Review of Systems  Constitutional: Negative for chills and fever.  HENT: Negative for congestion, sinus pressure, sinus pain and sore throat.   Eyes: Negative for  pain and discharge.  Respiratory: Negative for cough and shortness of breath.   Cardiovascular: Negative for chest pain and palpitations.  Gastrointestinal: Negative for abdominal pain, constipation, diarrhea, nausea and vomiting.  Endocrine: Negative for polydipsia and polyuria.  Genitourinary: Negative for dysuria and hematuria.  Musculoskeletal: Positive for arthralgias and gait problem. Negative for neck pain and neck stiffness.  Skin: Positive for color change (Left hand).  Neurological: Negative for dizziness and weakness.  Psychiatric/Behavioral: Negative for agitation and behavioral problems.      Objective:    Physical Exam Vitals reviewed.  Constitutional:      General: She is not in acute distress.    Appearance: She is not diaphoretic.     Comments: In wheelchair  HENT:     Head: Normocephalic and atraumatic.     Nose: Nose normal. No congestion.     Mouth/Throat:     Mouth: Mucous membranes are moist.     Pharynx: No posterior oropharyngeal erythema.  Eyes:     General: No scleral icterus.    Extraocular Movements: Extraocular movements intact.     Pupils: Pupils are equal, round, and reactive to light.  Cardiovascular:     Rate and Rhythm: Normal rate and regular rhythm.     Pulses: Normal pulses.     Heart sounds: Murmur (Systolic - most profound over right upper sternal border) heard.    Pulmonary:     Breath sounds: Normal breath sounds. No wheezing or rales.  Abdominal:     Palpations: Abdomen is soft.      Tenderness: There is no abdominal tenderness.  Musculoskeletal:     Cervical back: Neck supple. No tenderness.     Right lower leg: No edema.     Left lower leg: No edema.  Skin:    General: Skin is warm.     Findings: No rash.  Neurological:     General: No focal deficit present.     Mental Status: She is alert and oriented to person, place, and time.  Psychiatric:        Mood and Affect: Mood normal.        Behavior: Behavior normal.     BP (!) 144/74 (BP Location: Right Arm, Patient Position: Sitting, Cuff Size: Normal)   Pulse 65   Temp 98 F (36.7 C) (Temporal)   Ht 5\' 5"  (1.651 m)   Wt 134 lb (60.8 kg)   SpO2 94%   BMI 22.30 kg/m  Wt Readings from Last 3 Encounters:  07/09/20 134 lb (60.8 kg)  06/26/20 151 lb 4.8 oz (68.6 kg)  04/10/20 148 lb (67.1 kg)     Health Maintenance Due  Topic Date Due  . FOOT EXAM  Never done  . OPHTHALMOLOGY EXAM  Never done  . URINE MICROALBUMIN  Never done  . COVID-19 Vaccine (2 - Booster for Janssen series) 10/12/2019    There are no preventive care reminders to display for this patient.  Lab Results  Component Value Date   TSH 0.049 (L) 06/26/2020   Lab Results  Component Value Date   WBC 9.3 06/28/2020   HGB 12.8 06/28/2020   HCT 40.5 06/28/2020   MCV 89.4 06/28/2020   PLT 307 06/28/2020   Lab Results  Component Value Date   NA 137 06/28/2020   K 4.0 06/28/2020   CO2 24 06/28/2020   GLUCOSE 240 (H) 06/28/2020   BUN 15 06/28/2020   CREATININE 0.71 06/28/2020   BILITOT 0.7 06/26/2020  ALKPHOS 96 06/26/2020   AST 46 (H) 06/26/2020   ALT 26 06/26/2020   PROT 6.5 06/26/2020   ALBUMIN 3.5 06/26/2020   CALCIUM 9.5 06/28/2020   ANIONGAP 8 06/28/2020   Lab Results  Component Value Date   CHOL 197 06/27/2020   Lab Results  Component Value Date   HDL 71 06/27/2020   Lab Results  Component Value Date   LDLCALC 107 (H) 06/27/2020   Lab Results  Component Value Date   TRIG 95 06/27/2020   Lab Results   Component Value Date   CHOLHDL 2.8 06/27/2020   Lab Results  Component Value Date   HGBA1C 7.6 (H) 06/26/2020      Assessment & Plan:   Problem List Items Addressed This Visit    Hospital discharge follow-up - Primary Chart reviewed from hospital admission including discharge summary Treated for UTI with cephalosporins Check repeat CBC and BMP   Relevant Orders   CBC   Basic Metabolic Panel (BMET)    Other Visit Diagnoses    Physical deconditioning    Acute on chronic, worse due to recent infectio    Relevant Orders   Ambulatory referral to Physical Therapy     Cardiovascular and Mediastinum  Hypertension BP Readings from Last 1 Encounters:  07/09/20 (!) 144/74   On higher end, likely due to recent infection and metabolic encephalopathy Would avoid dose change for now Counseled for compliance with the medications     Genitourinary  Acute lower UTI Better now, treated with Rocephin and outpatient Cephalexin     No orders of the defined types were placed in this encounter.   Follow-up: Return in about 3 months (around 10/06/2020).    Lindell Spar, MD

## 2020-07-10 ENCOUNTER — Other Ambulatory Visit: Payer: Self-pay

## 2020-07-10 DIAGNOSIS — I639 Cerebral infarction, unspecified: Secondary | ICD-10-CM

## 2020-07-10 LAB — BASIC METABOLIC PANEL
BUN/Creatinine Ratio: 22 (ref 12–28)
BUN: 22 mg/dL (ref 8–27)
CO2: 22 mmol/L (ref 20–29)
Calcium: 9.8 mg/dL (ref 8.7–10.3)
Chloride: 103 mmol/L (ref 96–106)
Creatinine, Ser: 0.99 mg/dL (ref 0.57–1.00)
GFR calc Af Amer: 60 mL/min/{1.73_m2} (ref >59)
GFR calc non Af Amer: 52 mL/min/{1.73_m2} — ABNORMAL LOW (ref >59)
Glucose: 144 mg/dL — ABNORMAL HIGH (ref 65–99)
Potassium: 4.1 mmol/L (ref 3.5–5.2)
Sodium: 141 mmol/L (ref 134–144)

## 2020-07-10 LAB — CBC
Hematocrit: 39.5 % (ref 34.0–46.6)
Hemoglobin: 13 g/dL (ref 11.1–15.9)
MCH: 29.1 pg (ref 26.6–33.0)
MCHC: 32.9 g/dL (ref 31.5–35.7)
MCV: 89 fL (ref 79–97)
Platelets: 361 10*3/uL (ref 150–450)
RBC: 4.46 x10E6/uL (ref 3.77–5.28)
RDW: 15.8 % — ABNORMAL HIGH (ref 11.7–15.4)
WBC: 6.3 10*3/uL (ref 3.4–10.8)

## 2020-07-10 MED ORDER — CLOPIDOGREL BISULFATE 75 MG PO TABS: 75.0000 mg | ORAL_TABLET | Freq: Every day | ORAL | 4 refills | Status: AC

## 2020-07-21 ENCOUNTER — Emergency Department (HOSPITAL_COMMUNITY): Payer: Medicare Other

## 2020-07-21 ENCOUNTER — Inpatient Hospital Stay (HOSPITAL_COMMUNITY)
Admission: EM | Admit: 2020-07-21 | Discharge: 2020-07-24 | DRG: 481 | Disposition: A | Discharge status: Skilled Nursing Facility | Payer: Medicare Other | Attending: Internal Medicine | Admitting: Internal Medicine

## 2020-07-21 ENCOUNTER — Encounter (HOSPITAL_COMMUNITY): Payer: Self-pay

## 2020-07-21 ENCOUNTER — Other Ambulatory Visit: Payer: Self-pay

## 2020-07-21 DIAGNOSIS — S72142D Displaced intertrochanteric fracture of left femur, subsequent encounter for closed fracture with routine healing: Secondary | ICD-10-CM | POA: Diagnosis not present

## 2020-07-21 DIAGNOSIS — E785 Hyperlipidemia, unspecified: Secondary | ICD-10-CM | POA: Diagnosis not present

## 2020-07-21 DIAGNOSIS — I69398 Other sequelae of cerebral infarction: Secondary | ICD-10-CM | POA: Diagnosis not present

## 2020-07-21 DIAGNOSIS — Z23 Encounter for immunization: Secondary | ICD-10-CM

## 2020-07-21 DIAGNOSIS — D649 Anemia, unspecified: Secondary | ICD-10-CM | POA: Diagnosis not present

## 2020-07-21 DIAGNOSIS — E039 Hypothyroidism, unspecified: Secondary | ICD-10-CM | POA: Diagnosis present

## 2020-07-21 DIAGNOSIS — E119 Type 2 diabetes mellitus without complications: Secondary | ICD-10-CM

## 2020-07-21 DIAGNOSIS — Z79899 Other long term (current) drug therapy: Secondary | ICD-10-CM

## 2020-07-21 DIAGNOSIS — Z96611 Presence of right artificial shoulder joint: Secondary | ICD-10-CM | POA: Diagnosis not present

## 2020-07-21 DIAGNOSIS — Z7902 Long term (current) use of antithrombotics/antiplatelets: Secondary | ICD-10-CM

## 2020-07-21 DIAGNOSIS — I10 Essential (primary) hypertension: Secondary | ICD-10-CM | POA: Diagnosis not present

## 2020-07-21 DIAGNOSIS — E1169 Type 2 diabetes mellitus with other specified complication: Secondary | ICD-10-CM

## 2020-07-21 DIAGNOSIS — M810 Age-related osteoporosis without current pathological fracture: Secondary | ICD-10-CM | POA: Diagnosis present

## 2020-07-21 DIAGNOSIS — S0990XA Unspecified injury of head, initial encounter: Secondary | ICD-10-CM | POA: Diagnosis not present

## 2020-07-21 DIAGNOSIS — S72002A Fracture of unspecified part of neck of left femur, initial encounter for closed fracture: Principal | ICD-10-CM

## 2020-07-21 DIAGNOSIS — S0103XA Puncture wound without foreign body of scalp, initial encounter: Secondary | ICD-10-CM | POA: Diagnosis present

## 2020-07-21 DIAGNOSIS — W19XXXA Unspecified fall, initial encounter: Secondary | ICD-10-CM | POA: Diagnosis not present

## 2020-07-21 DIAGNOSIS — S72142A Displaced intertrochanteric fracture of left femur, initial encounter for closed fracture: Secondary | ICD-10-CM | POA: Diagnosis not present

## 2020-07-21 DIAGNOSIS — R6889 Other general symptoms and signs: Secondary | ICD-10-CM | POA: Diagnosis not present

## 2020-07-21 DIAGNOSIS — S32502A Unspecified fracture of left pubis, initial encounter for closed fracture: Secondary | ICD-10-CM | POA: Diagnosis not present

## 2020-07-21 DIAGNOSIS — Y92009 Unspecified place in unspecified non-institutional (private) residence as the place of occurrence of the external cause: Secondary | ICD-10-CM

## 2020-07-21 DIAGNOSIS — Z79890 Hormone replacement therapy (postmenopausal): Secondary | ICD-10-CM

## 2020-07-21 DIAGNOSIS — Z20822 Contact with and (suspected) exposure to covid-19: Secondary | ICD-10-CM | POA: Diagnosis not present

## 2020-07-21 DIAGNOSIS — Z888 Allergy status to other drugs, medicaments and biological substances status: Secondary | ICD-10-CM | POA: Diagnosis not present

## 2020-07-21 DIAGNOSIS — M1990 Unspecified osteoarthritis, unspecified site: Secondary | ICD-10-CM | POA: Diagnosis not present

## 2020-07-21 DIAGNOSIS — Z9181 History of falling: Secondary | ICD-10-CM | POA: Diagnosis not present

## 2020-07-21 DIAGNOSIS — Z87891 Personal history of nicotine dependence: Secondary | ICD-10-CM

## 2020-07-21 DIAGNOSIS — Z66 Do not resuscitate: Secondary | ICD-10-CM | POA: Diagnosis not present

## 2020-07-21 DIAGNOSIS — Z743 Need for continuous supervision: Secondary | ICD-10-CM | POA: Diagnosis not present

## 2020-07-21 DIAGNOSIS — I69354 Hemiplegia and hemiparesis following cerebral infarction affecting left non-dominant side: Secondary | ICD-10-CM | POA: Diagnosis not present

## 2020-07-21 DIAGNOSIS — S3289XD Fracture of other parts of pelvis, subsequent encounter for fracture with routine healing: Secondary | ICD-10-CM | POA: Diagnosis not present

## 2020-07-21 DIAGNOSIS — M6281 Muscle weakness (generalized): Secondary | ICD-10-CM | POA: Diagnosis not present

## 2020-07-21 DIAGNOSIS — Z7401 Bed confinement status: Secondary | ICD-10-CM | POA: Diagnosis not present

## 2020-07-21 DIAGNOSIS — S72009A Fracture of unspecified part of neck of unspecified femur, initial encounter for closed fracture: Secondary | ICD-10-CM

## 2020-07-21 DIAGNOSIS — R279 Unspecified lack of coordination: Secondary | ICD-10-CM | POA: Diagnosis not present

## 2020-07-21 DIAGNOSIS — Z8249 Family history of ischemic heart disease and other diseases of the circulatory system: Secondary | ICD-10-CM

## 2020-07-21 DIAGNOSIS — R293 Abnormal posture: Secondary | ICD-10-CM | POA: Diagnosis not present

## 2020-07-21 DIAGNOSIS — E876 Hypokalemia: Secondary | ICD-10-CM | POA: Diagnosis not present

## 2020-07-21 DIAGNOSIS — R296 Repeated falls: Secondary | ICD-10-CM | POA: Diagnosis not present

## 2020-07-21 DIAGNOSIS — Z794 Long term (current) use of insulin: Secondary | ICD-10-CM

## 2020-07-21 DIAGNOSIS — S0101XA Laceration without foreign body of scalp, initial encounter: Secondary | ICD-10-CM | POA: Diagnosis not present

## 2020-07-21 DIAGNOSIS — S0190XA Unspecified open wound of unspecified part of head, initial encounter: Secondary | ICD-10-CM | POA: Diagnosis not present

## 2020-07-21 DIAGNOSIS — R404 Transient alteration of awareness: Secondary | ICD-10-CM | POA: Diagnosis not present

## 2020-07-21 DIAGNOSIS — M2550 Pain in unspecified joint: Secondary | ICD-10-CM | POA: Diagnosis not present

## 2020-07-21 DIAGNOSIS — S199XXA Unspecified injury of neck, initial encounter: Secondary | ICD-10-CM | POA: Diagnosis not present

## 2020-07-21 DIAGNOSIS — Z043 Encounter for examination and observation following other accident: Secondary | ICD-10-CM | POA: Diagnosis not present

## 2020-07-21 DIAGNOSIS — W010XXA Fall on same level from slipping, tripping and stumbling without subsequent striking against object, initial encounter: Secondary | ICD-10-CM | POA: Diagnosis present

## 2020-07-21 DIAGNOSIS — Z0389 Encounter for observation for other suspected diseases and conditions ruled out: Secondary | ICD-10-CM | POA: Diagnosis not present

## 2020-07-21 DIAGNOSIS — R2681 Unsteadiness on feet: Secondary | ICD-10-CM | POA: Diagnosis not present

## 2020-07-21 LAB — CBC WITH DIFFERENTIAL/PLATELET
Abs Immature Granulocytes: 0.07 10*3/uL (ref 0.00–0.07)
Basophils Absolute: 0.1 10*3/uL (ref 0.0–0.1)
Basophils Relative: 1 %
Eosinophils Absolute: 0.2 10*3/uL (ref 0.0–0.5)
Eosinophils Relative: 2 %
HCT: 37.2 % (ref 36.0–46.0)
Hemoglobin: 11.8 g/dL — ABNORMAL LOW (ref 12.0–15.0)
Immature Granulocytes: 1 %
Lymphocytes Relative: 15 %
Lymphs Abs: 1.7 10*3/uL (ref 0.7–4.0)
MCH: 28.6 pg (ref 26.0–34.0)
MCHC: 31.7 g/dL (ref 30.0–36.0)
MCV: 90.3 fL (ref 80.0–100.0)
Monocytes Absolute: 1 10*3/uL (ref 0.1–1.0)
Monocytes Relative: 8 %
Neutro Abs: 8.8 10*3/uL — ABNORMAL HIGH (ref 1.7–7.7)
Neutrophils Relative %: 73 %
Platelets: 293 10*3/uL (ref 150–400)
RBC: 4.12 MIL/uL (ref 3.87–5.11)
RDW: 17.4 % — ABNORMAL HIGH (ref 11.5–15.5)
WBC: 11.8 10*3/uL — ABNORMAL HIGH (ref 4.0–10.5)
nRBC: 0 % (ref 0.0–0.2)

## 2020-07-21 LAB — BASIC METABOLIC PANEL
Anion gap: 11 (ref 5–15)
BUN: 24 mg/dL — ABNORMAL HIGH (ref 8–23)
CO2: 20 mmol/L — ABNORMAL LOW (ref 22–32)
Calcium: 9.5 mg/dL (ref 8.9–10.3)
Chloride: 107 mmol/L (ref 98–111)
Creatinine, Ser: 1.03 mg/dL — ABNORMAL HIGH (ref 0.44–1.00)
GFR, Estimated: 53 mL/min — ABNORMAL LOW (ref >60)
Glucose, Bld: 88 mg/dL (ref 70–99)
Potassium: 3.4 mmol/L — ABNORMAL LOW (ref 3.5–5.1)
Sodium: 138 mmol/L (ref 135–145)

## 2020-07-21 MED ORDER — LIDOCAINE-EPINEPHRINE (PF) 2 %-1:200000 IJ SOLN
20.0000 mL | Freq: Once | INTRAMUSCULAR | Status: AC
  Administered 2020-07-21: 20 mL via INTRADERMAL
  Filled 2020-07-21: qty 20

## 2020-07-21 MED ORDER — ONDANSETRON HCL 4 MG/2ML IJ SOLN
4.0000 mg | Freq: Once | INTRAMUSCULAR | Status: AC
  Administered 2020-07-21: 4 mg via INTRAVENOUS
  Filled 2020-07-21: qty 2

## 2020-07-21 MED ORDER — FENTANYL CITRATE (PF) 100 MCG/2ML IJ SOLN
50.0000 ug | Freq: Once | INTRAMUSCULAR | Status: AC
  Administered 2020-07-21: 50 ug via INTRAVENOUS
  Filled 2020-07-21: qty 2

## 2020-07-21 MED ORDER — TETANUS-DIPHTH-ACELL PERTUSSIS 5-2.5-18.5 LF-MCG/0.5 IM SUSY
0.5000 mL | PREFILLED_SYRINGE | Freq: Once | INTRAMUSCULAR | Status: AC
  Administered 2020-07-21: 0.5 mL via INTRAMUSCULAR
  Filled 2020-07-21: qty 0.5

## 2020-07-21 NOTE — ED Triage Notes (Signed)
Pt had a witnessed fal at home Pt axo x3. Pt with left lateral puncture wound on the back of skill. Pt reports left leg pain . Assessment with left leg rotated outward and upward . Pain 3/10

## 2020-07-21 NOTE — ED Notes (Signed)
Hx cataract surgery

## 2020-07-21 NOTE — ED Provider Notes (Signed)
[facility] EMERGENCY DEPARTMENT Provider Note   CSN: [id number] Arrival date & time: 07/21/20  2045     History Chief Complaint  Patient presents with  . Fall    Danielle [name] is a 87 y.o. female.  HPI   87 year old female with a history of arthritis, cataracts, CVA, residual left upper extremity weakness, hypertension, hyperlipidemia, heart murmur, diabetes, who presents the emergency department today for evaluation of a fall. States she walking down her home when she lost her balance and fell backwards hitting her head. She sustained a laceration to the back of the head. She is also having some pain in the left hip. She denies any loss of consciousness. She states that she is on plavix. She denies any other injuries and states she has been feeling well recently.  Past Medical History:  Diagnosis Date  . Arthritis    "some joint pain once in awhile" (05/26/2017)  . Back pain at L4-L5 level 06/14/2013  . Cataract   . CVA (cerebral vascular accident) ([place]) 2019  . Encounter for screening mammogram for malignant neoplasm of breast 04/19/2019  . Generalized weakness 06/09/2018  . Heart murmur   . History of kidney stones   . Hyperlipemia   . Hypertension   . Hypothyroidism (acquired)   . Muscle weakness (generalized) 12/17/2012  . Osteoporosis   . Pain in joint, shoulder region 12/17/2012  . Pneumothorax 02/05/2017   right-after fall  . Proximal humerus fracture 11/29/2012  . Rib fractures 02/05/2017   right side  . 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 Shoulder fracture 12/14/2012  . Stroke ([place])   . Stroke-like symptoms 12/23/2018  . Type II diabetes mellitus [facility])     Patient Active Problem List   Diagnosis Date Noted  . Acute lower UTI 06/27/2020  . Type 2 diabetes mellitus with hypoglycemia without coma ([place]) 06/27/2020  . Acute metabolic encephalopathy 03/04/5101  . Metabolic encephalopathy 58/52/7782  . Acute ischemic stroke ([place]) 06/26/2020  .  Carotid artery occlusion with infarction ([place]) 04/10/2020  . Fracture of left wrist with routine healing 03/15/2020  . Closed fracture of left pubis with routine healing 03/15/2020  . Leg swelling 02/15/2020  . Hospital discharge follow-up 02/15/2020  . Other fracture of upper end of left radius, closed fracture--transverse fracture in the distal radial metaphysis 12/31/2019  . Closed fracture of multiple pubic rami, left, initial encounter ([place]) 12/29/2019  . Fall at home, initial encounter 12/29/2019  . Breast pain, left 12/22/2019  . History of CVA with residual deficit 11/10/2019  . Osteoporosis 04/19/2019  . Hyperlipidemia 04/19/2019  . Carotid stenosis, bilateral 12/30/2018  . Hypothyroidism (acquired) 05/03/2018  . Hypertension 05/03/2018  . Uncontrolled type 2 diabetes mellitus with diabetic nephropathy, with long-term current use of insulin [facility]     Past Surgical History:  Procedure Laterality Date  . AUGMENTATION MAMMAPLASTY Bilateral   . COSMETIC SURGERY    . DILATION AND CURETTAGE OF UTERUS    . EYE SURGERY    . FRACTURE SURGERY    . JOINT REPLACEMENT    . REVERSE SHOULDER ARTHROPLASTY Right 05/26/2017  . REVERSE SHOULDER ARTHROPLASTY Right 05/26/2017   Procedure: REVERSE RIGHT SHOULDER ARTHROPLASTY;  Surgeon: Meredith Pel, MD;  Location: [place];  Service: Orthopedics;  Laterality: Right;  . TONSILLECTOMY    . TUBAL LIGATION       OB History    Gravida      Para      Term  Preterm      AB      Living  2     SAB      IAB      Ectopic      Multiple      Live Births              Family History  Problem Relation Age of Onset  . Cancer Mother   . Heart disease Father     Social History   Tobacco Use  . Smoking status: Former Smoker    Packs/day: 1.00    Years: 40.00    Pack years: 40.00    Types: Cigarettes    Quit date: 1991    Years since quitting: 31.1  . Smokeless tobacco: Never Used  Vaping Use  . Vaping Use: Never  used  Substance Use Topics  . Alcohol use: No  . Drug use: No    Home Medications Prior to Admission medications   Medication Sig Start Date End Date Taking? Authorizing Provider  acetaminophen (TYLENOL) 325 MG tablet Take 2 tablets (650 mg total) by mouth every 6 (six) hours as needed for mild pain (or Fever >/= 101). 01/20/19  Yes Angiulli, Lavon Paganini, PA-C  amLODipine (NORVASC) 5 MG tablet TAKE 1 TABLET BY MOUTH EVERY DAY Patient taking differently: Take 5 mg by mouth daily. 06/11/20  Yes Perlie Mayo, NP  atorvastatin (LIPITOR) 80 MG tablet Take 1 tablet (80 mg total) by mouth daily at 6 PM. 05/08/20  Yes Perlie Mayo, NP  Calcium Carb-Cholecalciferol (CALCIUM 600 + D PO) Take 1 tablet by mouth daily.   Yes [provider]  clopidogrel (PLAVIX) 75 MG tablet Take 1 tablet (75 mg total) by mouth daily. 07/10/20  Yes Lindell Spar, MD  diclofenac sodium (VOLTAREN) 1 % GEL Apply 2 g topically 4 (four) times daily. 01/20/19  Yes Angiulli, Lavon Paganini, PA-C  insulin glargine, 2 Unit Dial, (TOUJEO MAX SOLOSTAR) 300 UNIT/ML Solostar Pen Inject 32 Units into the skin daily before breakfast. 01/02/20  Yes Emokpae, Courage, MD  Insulin Lispro (HUMALOG KWIKPEN) 200 UNIT/ML SOPN Inject 10-12 Units into the skin in the morning and at bedtime.   Yes [provider]  levothyroxine (SYNTHROID) 100 MCG tablet TAKE 1 TABLET BY MOUTH EVERY DAY IN THE MORNING Patient taking differently: Take 100 mcg by mouth in the morning. 05/29/20  Yes Perlie Mayo, NP  Multiple Vitamins-Minerals (OCUVITE ADULT 50+ PO) Take 1 tablet by mouth daily.    Yes [provider]  vitamin B-12 (CYANOCOBALAMIN) 1000 MCG tablet Take 1 tablet (1,000 mcg total) by mouth daily. 01/20/19  Yes Angiulli, Lavon Paganini, PA-C  glucose blood test strip Use as instructed check blood sugar three times daily as needed for machine pt already has 04/25/20   Perlie Mayo, NP    Allergies    Fosamax [alendronate sodium] and  Prednisone  Review of Systems   Review of Systems  Constitutional: Negative for fever.  HENT: Negative for ear pain and sore throat.   Eyes: Negative for visual disturbance.  Respiratory: Negative for cough and shortness of breath.   Cardiovascular: Negative for chest pain.  Gastrointestinal: Negative for abdominal pain, nausea and vomiting.  Genitourinary: Negative for dysuria and hematuria.  Musculoskeletal: Negative for back pain and neck pain.       Left hip pain  Skin: Positive for wound.  Neurological: Positive for weakness (chronic lue weakness, baseline). Negative for numbness.  Head injury, no loc  All other systems reviewed and are negative.   Physical Exam Updated Vital Signs BP (!) 150/67 (BP Location: Left Arm)   Pulse (!) 141   Temp 98 F (36.7 C) (Oral)   Resp 18   Ht 5\' 5"  (1.651 m)   Wt 64 kg   SpO2 99%   BMI 23.46 kg/m   Physical Exam Vitals and nursing note reviewed.  Constitutional:      General: She is not in acute distress.    Appearance: She is well-developed and well-nourished.  HENT:     Head: Normocephalic.     Comments: 1cm laceration to the posterior scalp without any active bleeding Eyes:     Conjunctiva/sclera: Conjunctivae normal.  Cardiovascular:     Rate and Rhythm: Normal rate and regular rhythm.     Heart sounds: Normal heart sounds. No murmur heard.   Pulmonary:     Effort: Pulmonary effort is normal. No respiratory distress.     Breath sounds: Normal breath sounds. No wheezing, rhonchi or rales.  Abdominal:     General: Bowel sounds are normal.     Palpations: Abdomen is soft.     Tenderness: There is no abdominal tenderness. There is no guarding or rebound.  Musculoskeletal:        General: No edema.     Cervical back: Neck supple.     Comments: No TTP to the CTL spine. TTP noted to the left hip and pain with ROM noted to the left hip. No TTP to the right hip.   Skin:    General: Skin is warm and dry.   Neurological:     Mental Status: She is alert.  Psychiatric:        Mood and Affect: Mood and affect normal.     ED Results / Procedures / Treatments   Labs (all labs ordered are listed, but only abnormal results are displayed) Labs Reviewed  CBC WITH DIFFERENTIAL/PLATELET - Abnormal; Notable for the following components:      Result Value   WBC 11.8 (*)    Hemoglobin 11.8 (*)    RDW 17.4 (*)    Neutro Abs 8.8 (*)    All other components within normal limits  BASIC METABOLIC PANEL - Abnormal; Notable for the following components:   Potassium 3.4 (*)    CO2 20 (*)    BUN 24 (*)    Creatinine, Ser 1.03 (*)    GFR, Estimated 53 (*)    All other components within normal limits  RESP PANEL BY RT-PCR (FLU A&B, COVID) [id number]    EKG EKG Interpretation  Date/Time:  Saturday July 21 2020 21:31:38 EST Ventricular Rate:  87 PR Interval:    QRS Duration: 150 QT Interval:  412 QTC Calculation: 496 R Axis:   -67 Text Interpretation: Sinus rhythm Borderline prolonged PR interval Left bundle branch block No significant change since last tracing Confirmed by Fredia Sorrow [phone]) on 07/21/2020 10:01:43 PM   Radiology DG Chest 1 View  Result Date: 07/21/2020 CLINICAL DATA:  Fall, left hip fracture EXAM: CHEST  1 VIEW COMPARISON:  07/16/2018 FINDINGS: Lungs are clear. No pneumothorax or pleural effusion. Cardiac size within normal limits. Pulmonary vascularity is normal. Bilateral breast implants are noted. Right total shoulder arthroplasty has been performed. No acute bone abnormality. IMPRESSION: No active disease. Electronically Signed   By: Fidela Salisbury MD   On: 07/21/2020 23:00   CT Head Wo Contrast  Result Date: 07/21/2020  CLINICAL DATA:  Pt had a witnessed fal at home Pt axo x3. Pt with left lateral puncture wound on the back of skill. Pt reports left leg pain . Assessment with left leg rotated outward and upward . Pain 3/10. EXAM: CT HEAD WITHOUT CONTRAST CT CERVICAL SPINE  WITHOUT CONTRAST TECHNIQUE: Multidetector CT imaging of the head and cervical spine was performed following the standard protocol without intravenous contrast. Multiplanar CT image reconstructions of the cervical spine were also generated. COMPARISON:  CT angio head and neck 06/26/2020, MR head 06/26/2020 FINDINGS: CT HEAD FINDINGS Brain: Patchy and confluent areas of decreased attenuation are noted throughout the deep and periventricular white matter of the cerebral hemispheres bilaterally, compatible with chronic microvascular ischemic disease. Chronic right middle cerebral artery infarction. No evidence of large-territorial acute infarction. No parenchymal hemorrhage. No mass lesion. No extra-axial collection. A 3cm isodense left frontal parafalcine mass is consistent with known meningioma. No hydrocephalus. Basilar cisterns are patent. Vascular: No hyperdense vessel. Atherosclerotic calcifications are present within the cavernous internal carotid arteries. Skull: No acute fracture or focal lesion. Sinuses/Orbits: Paranasal sinuses and mastoid air cells are clear. Bilateral lens replacement. Otherwise the orbits are unremarkable. Other: None. CT CERVICAL SPINE FINDINGS Alignment: Similar appearing grade 1 anterolisthesis of C4 on C5. similar-appearing mild retrolisthesis of C6 on C7. Skull base and vertebrae: Redemonstration of partial fusion of the C5-C6 vertebral bodies. Redemonstration of severe multilevel degenerative changes of the spine. No acute fracture. No aggressive appearing focal osseous lesion or focal pathologic process. Soft tissues and spinal canal: No prevertebral fluid or swelling. No visible canal hematoma. Upper chest: Biapical pleural/pulmonary scarring. Other: Severe atherosclerotic plaque of the carotid arteries within the neck. Old healed right clavicular fracture. IMPRESSION: 1. No acute intracranial abnormality in a patient with known prior right middle cerebral artery infarction and 3  cm left frontal parafalcine meningioma. 2. No acute displaced fracture or traumatic listhesis of the cervical spine. 3. Severe atherosclerotic plaque of the carotid arteries within the neck. Recommend carotid ultrasound further evaluation. Electronically Signed   By: Iven Finn M.D.   On: 07/21/2020 22:25   CT Cervical Spine Wo Contrast  Result Date: 07/21/2020 CLINICAL DATA:  Pt had a witnessed fal at home Pt axo x3. Pt with left lateral puncture wound on the back of skill. Pt reports left leg pain . Assessment with left leg rotated outward and upward . Pain 3/10. EXAM: CT HEAD WITHOUT CONTRAST CT CERVICAL SPINE WITHOUT CONTRAST TECHNIQUE: Multidetector CT imaging of the head and cervical spine was performed following the standard protocol without intravenous contrast. Multiplanar CT image reconstructions of the cervical spine were also generated. COMPARISON:  CT angio head and neck 06/26/2020, MR head 06/26/2020 FINDINGS: CT HEAD FINDINGS Brain: Patchy and confluent areas of decreased attenuation are noted throughout the deep and periventricular white matter of the cerebral hemispheres bilaterally, compatible with chronic microvascular ischemic disease. Chronic right middle cerebral artery infarction. No evidence of large-territorial acute infarction. No parenchymal hemorrhage. No mass lesion. No extra-axial collection. A 3cm isodense left frontal parafalcine mass is consistent with known meningioma. No hydrocephalus. Basilar cisterns are patent. Vascular: No hyperdense vessel. Atherosclerotic calcifications are present within the cavernous internal carotid arteries. Skull: No acute fracture or focal lesion. Sinuses/Orbits: Paranasal sinuses and mastoid air cells are clear. Bilateral lens replacement. Otherwise the orbits are unremarkable. Other: None. CT CERVICAL SPINE FINDINGS Alignment: Similar appearing grade 1 anterolisthesis of C4 on C5. similar-appearing mild retrolisthesis of C6 on C7. Skull base  and vertebrae: Redemonstration of partial fusion of the C5-C6 vertebral bodies. Redemonstration of severe multilevel degenerative changes of the spine. No acute fracture. No aggressive appearing focal osseous lesion or focal pathologic process. Soft tissues and spinal canal: No prevertebral fluid or swelling. No visible canal hematoma. Upper chest: Biapical pleural/pulmonary scarring. Other: Severe atherosclerotic plaque of the carotid arteries within the neck. Old healed right clavicular fracture. IMPRESSION: 1. No acute intracranial abnormality in a patient with known prior right middle cerebral artery infarction and 3 cm left frontal parafalcine meningioma. 2. No acute displaced fracture or traumatic listhesis of the cervical spine. 3. Severe atherosclerotic plaque of the carotid arteries within the neck. Recommend carotid ultrasound further evaluation. Electronically Signed   By: Iven Finn M.D.   On: 07/21/2020 22:25   DG Hip Unilat W or Wo Pelvis 2-3 Views Left  Result Date: 07/21/2020 CLINICAL DATA:  Fall, left hip pain EXAM: DG HIP (WITH OR WITHOUT PELVIS) 2-3V LEFT COMPARISON:  None. FINDINGS: Single view radiograph of the pelvis and two view radiograph of the left hip demonstrates an acute, mildly displaced, mildly medially angulated intratrochanteric fracture of the left femur. The femoral head is still seated within the left acetabulum. At least moderate left hip degenerative arthritis. There are subacute fractures of the right superior and inferior pubic rami. The left pubic rami are not well assessed due to obliquity on this examination. IMPRESSION: Acute angulated, displaced left intratrochanteric hip fracture. Left femoral head is still seated within the left acetabulum. Subacute fractures of the right superior and inferior pubic rami. Electronically Signed   By: Fidela Salisbury MD   On: 07/21/2020 22:58    Procedures .Marland KitchenLaceration Repair  Date/Time: 07/21/2020 11:17 PM Performed by:  Rodney Booze, PA-C Authorized by: Rodney Booze, PA-C   Consent:    Consent obtained:  Verbal   Consent given by:  Patient   Risks, benefits, and alternatives were discussed: yes     Risks discussed:  Infection, need for additional repair and pain   Alternatives discussed:  No treatment Universal protocol:    Procedure explained and questions answered to patient or proxy's satisfaction: yes     Relevant documents present and verified: yes     Test results available: yes     Imaging studies available: yes     Site/side marked: yes     Patient identity confirmed:  Verbally with patient Anesthesia:    Anesthesia method:  Local infiltration   Local anesthetic:  Lidocaine 2% WITH epi Laceration details:    Location:  Scalp   Scalp location:  Crown   Length (cm):  1 Pre-procedure details:    Preparation:  Patient was prepped and draped in usual sterile fashion and imaging obtained to evaluate for foreign bodies Exploration:    Limited defect created (wound extended): no     Hemostasis achieved with:  Epinephrine and direct pressure   Wound exploration: wound explored through full range of motion and entire depth of wound visualized   Treatment:    Area cleansed with:  Saline   Amount of cleaning:  Standard   Irrigation method:  Pressure wash   Visualized foreign bodies/material removed: no     Debridement:  None   Undermining:  None   Scar revision: no   Skin repair:    Repair method:  Staples   Number of staples:  2 Approximation:    Approximation:  Close Repair type:    Repair type:  Simple Post-procedure details:  Dressing:  Open (no dressing)   Procedure completion:  Tolerated     Medications Ordered in ED Medications  fentaNYL (SUBLIMAZE) injection 50 mcg (50 mcg Intravenous Given 07/21/20 2303)  ondansetron (ZOFRAN) injection 4 mg (4 mg Intravenous Given 07/21/20 2302)  Tdap (BOOSTRIX) injection 0.5 mL (0.5 mLs Intramuscular Given 07/21/20 2305)   lidocaine-EPINEPHrine (XYLOCAINE W/EPI) 2 %-1:200000 (PF) injection 20 mL (20 mLs Intradermal Given 07/21/20 2305)    ED Course  I have reviewed the triage vital signs and the nursing notes.  Pertinent labs & imaging results that were available during my care of the patient were reviewed by me and considered in my medical decision making (see chart for details).    MDM Rules/Calculators/A&P                          87 y/o F presenting for eval after a fall. Sustained head trauma but did not have LOC. C/o left hip pain  Reviewed/interpreted labs CBC with mild leukocytosis, mild anemia BMP with mild hypokalemia, slightly elevated BUN/creatinine COVID pending on admission   EKG Sinus rhythm Borderline prolonged PR interval Left bundle branch block No significant change since last tracing  Reviewed/interpreted imaging  CT head/cervical spine -  1. No acute intracranial abnormality in a patient with known prior right middle cerebral artery infarction and 3 cm left frontal parafalcine meningioma. 2. No acute displaced fracture or traumatic listhesis of the cervical spine. 3. Severe atherosclerotic plaque of the carotid arteries within the neck. Recommend carotid ultrasound further evaluation. CXR - : No active disease. Xray pelvis/left hip -  Acute angulated, displaced left intratrochanteric hip fracture. Left femoral head is still seated within the left acetabulum. Subacute fractures of the right superior and inferior pubic rami.  11:23 PM CONSULT with Dr. Lucia Gaskins with orthopedics who recommends admission to [facility]  11:50 PM CONSULT with Dr. Clearence Ped who accepts patient for admission   Final Clinical Impression(s) / ED Diagnoses Final diagnoses:  Closed fracture of left hip, initial encounter [facility])    Rx / DC Orders ED Discharge Orders    None       Rodney Booze, PA-C 07/21/20 2351    Fredia Sorrow, MD 08/12/20 [phone]

## 2020-07-21 NOTE — ED Notes (Signed)
Pt transported to radiology.

## 2020-07-21 NOTE — ED Notes (Signed)
Left hip

## 2020-07-22 ENCOUNTER — Inpatient Hospital Stay (HOSPITAL_COMMUNITY): Payer: Medicare Other

## 2020-07-22 ENCOUNTER — Inpatient Hospital Stay (HOSPITAL_COMMUNITY): Payer: Medicare Other | Admitting: Certified Registered Nurse Anesthetist

## 2020-07-22 ENCOUNTER — Encounter (HOSPITAL_COMMUNITY)
Admission: EM | Disposition: A | Discharge status: Skilled Nursing Facility | Payer: Self-pay | Source: Home / Self Care | Attending: Internal Medicine

## 2020-07-22 DIAGNOSIS — S72142A Displaced intertrochanteric fracture of left femur, initial encounter for closed fracture: Secondary | ICD-10-CM | POA: Diagnosis present

## 2020-07-22 DIAGNOSIS — M1990 Unspecified osteoarthritis, unspecified site: Secondary | ICD-10-CM | POA: Diagnosis present

## 2020-07-22 DIAGNOSIS — E1169 Type 2 diabetes mellitus with other specified complication: Secondary | ICD-10-CM | POA: Diagnosis not present

## 2020-07-22 DIAGNOSIS — S72002A Fracture of unspecified part of neck of left femur, initial encounter for closed fracture: Secondary | ICD-10-CM | POA: Diagnosis present

## 2020-07-22 DIAGNOSIS — E785 Hyperlipidemia, unspecified: Secondary | ICD-10-CM | POA: Diagnosis not present

## 2020-07-22 DIAGNOSIS — M810 Age-related osteoporosis without current pathological fracture: Secondary | ICD-10-CM | POA: Diagnosis present

## 2020-07-22 DIAGNOSIS — Z96611 Presence of right artificial shoulder joint: Secondary | ICD-10-CM | POA: Diagnosis present

## 2020-07-22 DIAGNOSIS — Y92009 Unspecified place in unspecified non-institutional (private) residence as the place of occurrence of the external cause: Secondary | ICD-10-CM | POA: Diagnosis not present

## 2020-07-22 DIAGNOSIS — R296 Repeated falls: Secondary | ICD-10-CM | POA: Diagnosis present

## 2020-07-22 DIAGNOSIS — Z8249 Family history of ischemic heart disease and other diseases of the circulatory system: Secondary | ICD-10-CM | POA: Diagnosis not present

## 2020-07-22 DIAGNOSIS — Z794 Long term (current) use of insulin: Secondary | ICD-10-CM

## 2020-07-22 DIAGNOSIS — E039 Hypothyroidism, unspecified: Secondary | ICD-10-CM | POA: Diagnosis present

## 2020-07-22 DIAGNOSIS — Z66 Do not resuscitate: Secondary | ICD-10-CM | POA: Diagnosis present

## 2020-07-22 DIAGNOSIS — W19XXXA Unspecified fall, initial encounter: Secondary | ICD-10-CM

## 2020-07-22 DIAGNOSIS — E119 Type 2 diabetes mellitus without complications: Secondary | ICD-10-CM | POA: Diagnosis present

## 2020-07-22 DIAGNOSIS — Z20822 Contact with and (suspected) exposure to covid-19: Secondary | ICD-10-CM | POA: Diagnosis present

## 2020-07-22 DIAGNOSIS — Z79890 Hormone replacement therapy (postmenopausal): Secondary | ICD-10-CM | POA: Diagnosis not present

## 2020-07-22 DIAGNOSIS — Z79899 Other long term (current) drug therapy: Secondary | ICD-10-CM | POA: Diagnosis not present

## 2020-07-22 DIAGNOSIS — E876 Hypokalemia: Secondary | ICD-10-CM | POA: Diagnosis present

## 2020-07-22 DIAGNOSIS — W010XXA Fall on same level from slipping, tripping and stumbling without subsequent striking against object, initial encounter: Secondary | ICD-10-CM | POA: Diagnosis not present

## 2020-07-22 DIAGNOSIS — I10 Essential (primary) hypertension: Secondary | ICD-10-CM | POA: Diagnosis present

## 2020-07-22 DIAGNOSIS — Z888 Allergy status to other drugs, medicaments and biological substances status: Secondary | ICD-10-CM | POA: Diagnosis not present

## 2020-07-22 DIAGNOSIS — D649 Anemia, unspecified: Secondary | ICD-10-CM | POA: Diagnosis present

## 2020-07-22 DIAGNOSIS — I69354 Hemiplegia and hemiparesis following cerebral infarction affecting left non-dominant side: Secondary | ICD-10-CM | POA: Diagnosis not present

## 2020-07-22 DIAGNOSIS — Z87891 Personal history of nicotine dependence: Secondary | ICD-10-CM | POA: Diagnosis not present

## 2020-07-22 DIAGNOSIS — Z7902 Long term (current) use of antithrombotics/antiplatelets: Secondary | ICD-10-CM | POA: Diagnosis not present

## 2020-07-22 DIAGNOSIS — Z23 Encounter for immunization: Secondary | ICD-10-CM | POA: Diagnosis present

## 2020-07-22 DIAGNOSIS — S0103XA Puncture wound without foreign body of scalp, initial encounter: Secondary | ICD-10-CM | POA: Diagnosis present

## 2020-07-22 HISTORY — PX: FEMUR IM NAIL: SHX1597

## 2020-07-22 LAB — CBC WITH DIFFERENTIAL/PLATELET
Abs Immature Granulocytes: 0.05 10*3/uL (ref 0.00–0.07)
Basophils Absolute: 0 10*3/uL (ref 0.0–0.1)
Basophils Relative: 0 %
Eosinophils Absolute: 0 10*3/uL (ref 0.0–0.5)
Eosinophils Relative: 0 %
HCT: 35.1 % — ABNORMAL LOW (ref 36.0–46.0)
Hemoglobin: 11.4 g/dL — ABNORMAL LOW (ref 12.0–15.0)
Immature Granulocytes: 0 %
Lymphocytes Relative: 11 %
Lymphs Abs: 1.2 10*3/uL (ref 0.7–4.0)
MCH: 28.7 pg (ref 26.0–34.0)
MCHC: 32.5 g/dL (ref 30.0–36.0)
MCV: 88.4 fL (ref 80.0–100.0)
Monocytes Absolute: 0.8 10*3/uL (ref 0.1–1.0)
Monocytes Relative: 7 %
Neutro Abs: 9.2 10*3/uL — ABNORMAL HIGH (ref 1.7–7.7)
Neutrophils Relative %: 82 %
Platelets: 264 10*3/uL (ref 150–400)
RBC: 3.97 MIL/uL (ref 3.87–5.11)
RDW: 17.1 % — ABNORMAL HIGH (ref 11.5–15.5)
WBC: 11.4 10*3/uL — ABNORMAL HIGH (ref 4.0–10.5)
nRBC: 0 % (ref 0.0–0.2)

## 2020-07-22 LAB — COMPREHENSIVE METABOLIC PANEL
ALT: 23 U/L (ref 0–44)
AST: 32 U/L (ref 15–41)
Albumin: 3.4 g/dL — ABNORMAL LOW (ref 3.5–5.0)
Alkaline Phosphatase: 93 U/L (ref 38–126)
Anion gap: 9 (ref 5–15)
BUN: 24 mg/dL — ABNORMAL HIGH (ref 8–23)
CO2: 21 mmol/L — ABNORMAL LOW (ref 22–32)
Calcium: 9.3 mg/dL (ref 8.9–10.3)
Chloride: 108 mmol/L (ref 98–111)
Creatinine, Ser: 0.92 mg/dL (ref 0.44–1.00)
GFR, Estimated: >60 mL/min (ref >60)
Glucose, Bld: 333 mg/dL — ABNORMAL HIGH (ref 70–99)
Potassium: 4.1 mmol/L (ref 3.5–5.1)
Sodium: 138 mmol/L (ref 135–145)
Total Bilirubin: 0.9 mg/dL (ref 0.3–1.2)
Total Protein: 6 g/dL — ABNORMAL LOW (ref 6.5–8.1)

## 2020-07-22 LAB — RESP PANEL BY RT-PCR (FLU A&B, COVID) ARPGX2
Influenza A by PCR: NEGATIVE
Influenza B by PCR: NEGATIVE
SARS Coronavirus 2 by RT PCR: NEGATIVE

## 2020-07-22 LAB — URINALYSIS, ROUTINE W REFLEX MICROSCOPIC
Bilirubin Urine: NEGATIVE
Glucose, UA: >=500 mg/dL — AB
Hgb urine dipstick: NEGATIVE
Ketones, ur: 20 mg/dL — AB
Leukocytes,Ua: NEGATIVE
Nitrite: POSITIVE — AB
Protein, ur: NEGATIVE mg/dL
Specific Gravity, Urine: 1.022 (ref 1.005–1.030)
pH: 5 (ref 5.0–8.0)

## 2020-07-22 LAB — GLUCOSE, CAPILLARY
Glucose-Capillary: 304 mg/dL — ABNORMAL HIGH (ref 70–99)
Glucose-Capillary: 392 mg/dL — ABNORMAL HIGH (ref 70–99)
Glucose-Capillary: 272 mg/dL — ABNORMAL HIGH (ref 70–99)
Glucose-Capillary: 270 mg/dL — ABNORMAL HIGH (ref 70–99)
Glucose-Capillary: 234 mg/dL — ABNORMAL HIGH (ref 70–99)
Glucose-Capillary: 300 mg/dL — ABNORMAL HIGH (ref 70–99)
Glucose-Capillary: 252 mg/dL — ABNORMAL HIGH (ref 70–99)

## 2020-07-22 LAB — MAGNESIUM: Magnesium: 1.7 mg/dL (ref 1.7–2.4)

## 2020-07-22 LAB — TSH: TSH: 0.076 u[IU]/mL — ABNORMAL LOW (ref 0.350–4.500)

## 2020-07-22 SURGERY — INSERTION, INTRAMEDULLARY ROD, FEMUR
Anesthesia: General | Site: Hip | Laterality: Left

## 2020-07-22 MED ORDER — DEXAMETHASONE SODIUM PHOSPHATE 10 MG/ML IJ SOLN
INTRAMUSCULAR | Status: AC
  Filled 2020-07-22: qty 1

## 2020-07-22 MED ORDER — LEVOTHYROXINE SODIUM 100 MCG PO TABS
100.0000 ug | ORAL_TABLET | Freq: Every day | ORAL | Status: DC
  Administered 2020-07-22 – 2020-07-24 (×2): 100 ug via ORAL
  Filled 2020-07-22 (×2): qty 1

## 2020-07-22 MED ORDER — MENTHOL 3 MG MT LOZG: 1.0000 | LOZENGE | OROMUCOSAL | Status: DC | PRN

## 2020-07-22 MED ORDER — ROCURONIUM BROMIDE 10 MG/ML (PF) SYRINGE
PREFILLED_SYRINGE | INTRAVENOUS | Status: AC
  Filled 2020-07-22: qty 10

## 2020-07-22 MED ORDER — PROPOFOL 500 MG/50ML IV EMUL
INTRAVENOUS | Status: DC | PRN
  Administered 2020-07-22: 20 ug/kg/min via INTRAVENOUS

## 2020-07-22 MED ORDER — INSULIN DETEMIR 100 UNIT/ML ~~LOC~~ SOLN: 25.0000 [IU] | Freq: Every day | SUBCUTANEOUS | Status: DC

## 2020-07-22 MED ORDER — POTASSIUM CHLORIDE 10 MEQ/100ML IV SOLN
10.0000 meq | INTRAVENOUS | Status: AC
  Administered 2020-07-22 (×2): 10 meq via INTRAVENOUS
  Filled 2020-07-22 (×2): qty 100

## 2020-07-22 MED ORDER — OXYCODONE HCL 5 MG PO TABS
5.0000 mg | ORAL_TABLET | ORAL | Status: DC | PRN
  Administered 2020-07-22 – 2020-07-23 (×3): 5 mg via ORAL
  Filled 2020-07-22 (×3): qty 1

## 2020-07-22 MED ORDER — POVIDONE-IODINE 10 % EX SWAB: 2.0000 "application " | Freq: Once | CUTANEOUS | Status: DC

## 2020-07-22 MED ORDER — LIDOCAINE 2% (20 MG/ML) 5 ML SYRINGE
INTRAMUSCULAR | Status: DC | PRN
  Administered 2020-07-22: 50 mg via INTRAVENOUS

## 2020-07-22 MED ORDER — CEFAZOLIN SODIUM-DEXTROSE 2-4 GM/100ML-% IV SOLN
2.0000 g | Freq: Four times a day (QID) | INTRAVENOUS | Status: AC
  Administered 2020-07-22 – 2020-07-23 (×2): 2 g via INTRAVENOUS
  Filled 2020-07-22 (×2): qty 100

## 2020-07-22 MED ORDER — ATORVASTATIN CALCIUM 40 MG PO TABS
80.0000 mg | ORAL_TABLET | Freq: Every day | ORAL | Status: DC
Start: 2020-07-22 — End: 2020-07-24
  Administered 2020-07-23: 80 mg via ORAL
  Filled 2020-07-22 (×2): qty 2

## 2020-07-22 MED ORDER — ONDANSETRON HCL 4 MG/2ML IJ SOLN: 4.0000 mg | Freq: Four times a day (QID) | INTRAMUSCULAR | Status: DC | PRN

## 2020-07-22 MED ORDER — FENTANYL CITRATE (PF) 100 MCG/2ML IJ SOLN
INTRAMUSCULAR | Status: AC
  Filled 2020-07-22: qty 2

## 2020-07-22 MED ORDER — ONDANSETRON HCL 4 MG/2ML IJ SOLN
INTRAMUSCULAR | Status: AC
  Filled 2020-07-22: qty 2

## 2020-07-22 MED ORDER — PROPOFOL 10 MG/ML IV BOLUS
INTRAVENOUS | Status: DC | PRN
  Administered 2020-07-22: 60 mg via INTRAVENOUS

## 2020-07-22 MED ORDER — ONDANSETRON HCL 4 MG PO TABS: 4.0000 mg | ORAL_TABLET | Freq: Four times a day (QID) | ORAL | Status: DC | PRN

## 2020-07-22 MED ORDER — INSULIN GLARGINE 100 UNIT/ML ~~LOC~~ SOLN: 32.0000 [IU] | Freq: Every day | SUBCUTANEOUS | Status: DC

## 2020-07-22 MED ORDER — CEFAZOLIN SODIUM-DEXTROSE 2-4 GM/100ML-% IV SOLN
INTRAVENOUS | Status: AC
  Filled 2020-07-22: qty 100

## 2020-07-22 MED ORDER — LIDOCAINE 2% (20 MG/ML) 5 ML SYRINGE
INTRAMUSCULAR | Status: AC
  Filled 2020-07-22: qty 5

## 2020-07-22 MED ORDER — ROCURONIUM BROMIDE 10 MG/ML (PF) SYRINGE
PREFILLED_SYRINGE | INTRAVENOUS | Status: DC | PRN
  Administered 2020-07-22: 50 mg via INTRAVENOUS

## 2020-07-22 MED ORDER — SUGAMMADEX SODIUM 200 MG/2ML IV SOLN
INTRAVENOUS | Status: DC | PRN
  Administered 2020-07-22: 150 mg via INTRAVENOUS

## 2020-07-22 MED ORDER — CEFAZOLIN SODIUM-DEXTROSE 2-4 GM/100ML-% IV SOLN
2.0000 g | INTRAVENOUS | Status: AC
  Administered 2020-07-22: 2 g via INTRAVENOUS

## 2020-07-22 MED ORDER — CHLORHEXIDINE GLUCONATE 4 % EX LIQD
60.0000 mL | Freq: Once | CUTANEOUS | Status: AC
  Administered 2020-07-22: 4 via TOPICAL

## 2020-07-22 MED ORDER — OXYCODONE HCL 5 MG/5ML PO SOLN: 5.0000 mg | Freq: Once | ORAL | Status: DC | PRN

## 2020-07-22 MED ORDER — PHENOL 1.4 % MT LIQD
1.0000 | OROMUCOSAL | Status: DC | PRN
  Filled 2020-07-22: qty 177

## 2020-07-22 MED ORDER — ONDANSETRON HCL 4 MG/2ML IJ SOLN: 4.0000 mg | Freq: Once | INTRAMUSCULAR | Status: DC | PRN

## 2020-07-22 MED ORDER — LACTATED RINGERS IV SOLN: INTRAVENOUS | Status: DC | PRN

## 2020-07-22 MED ORDER — ACETAMINOPHEN 325 MG PO TABS
650.0000 mg | ORAL_TABLET | Freq: Four times a day (QID) | ORAL | Status: DC | PRN
  Administered 2020-07-22: 650 mg via ORAL
  Filled 2020-07-22: qty 2

## 2020-07-22 MED ORDER — HEPARIN SODIUM (PORCINE) 5000 UNIT/ML IJ SOLN
5000.0000 [IU] | Freq: Three times a day (TID) | INTRAMUSCULAR | Status: DC
  Administered 2020-07-22 – 2020-07-24 (×6): 5000 [IU] via SUBCUTANEOUS
  Filled 2020-07-22 (×6): qty 1

## 2020-07-22 MED ORDER — AMLODIPINE BESYLATE 5 MG PO TABS
5.0000 mg | ORAL_TABLET | Freq: Every day | ORAL | Status: DC
  Administered 2020-07-23 – 2020-07-24 (×2): 5 mg via ORAL
  Filled 2020-07-22 (×2): qty 1

## 2020-07-22 MED ORDER — CLOPIDOGREL BISULFATE 75 MG PO TABS: 75.0000 mg | ORAL_TABLET | Freq: Every day | ORAL | Status: DC

## 2020-07-22 MED ORDER — SODIUM CHLORIDE 0.9 % IV SOLN
INTRAVENOUS | Status: DC | PRN
  Administered 2020-07-22: 250 mL via INTRAVENOUS

## 2020-07-22 MED ORDER — INSULIN ASPART 100 UNIT/ML ~~LOC~~ SOLN
0.0000 [IU] | Freq: Three times a day (TID) | SUBCUTANEOUS | Status: DC
  Administered 2020-07-22 (×2): 9 [IU] via SUBCUTANEOUS
  Administered 2020-07-22: 17:00:00 5 [IU] via SUBCUTANEOUS
  Administered 2020-07-23: 3 [IU] via SUBCUTANEOUS
  Administered 2020-07-23: 08:00:00 5 [IU] via SUBCUTANEOUS
  Administered 2020-07-23: 17:00:00 3 [IU] via SUBCUTANEOUS
  Administered 2020-07-24 (×2): 5 [IU] via SUBCUTANEOUS

## 2020-07-22 MED ORDER — 0.9 % SODIUM CHLORIDE (POUR BTL) OPTIME
TOPICAL | Status: DC | PRN
  Administered 2020-07-22: 1000 mL

## 2020-07-22 MED ORDER — PHENYLEPHRINE HCL-NACL 10-0.9 MG/250ML-% IV SOLN
INTRAVENOUS | Status: DC | PRN
  Administered 2020-07-22: 40 ug/min via INTRAVENOUS

## 2020-07-22 MED ORDER — MORPHINE SULFATE (PF) 2 MG/ML IV SOLN
2.0000 mg | INTRAVENOUS | Status: DC | PRN
  Administered 2020-07-22 (×2): 2 mg via INTRAVENOUS
  Filled 2020-07-22 (×2): qty 1

## 2020-07-22 MED ORDER — INSULIN ASPART 100 UNIT/ML ~~LOC~~ SOLN
0.0000 [IU] | Freq: Every day | SUBCUTANEOUS | Status: DC
  Administered 2020-07-22: 22:00:00 3 [IU] via SUBCUTANEOUS
  Administered 2020-07-23: 23:00:00 5 [IU] via SUBCUTANEOUS

## 2020-07-22 MED ORDER — INSULIN GLARGINE 100 UNIT/ML ~~LOC~~ SOLN
32.0000 [IU] | Freq: Every day | SUBCUTANEOUS | Status: DC
  Administered 2020-07-23 – 2020-07-24 (×2): 32 [IU] via SUBCUTANEOUS
  Filled 2020-07-22 (×2): qty 0.32

## 2020-07-22 MED ORDER — PHENYLEPHRINE 40 MCG/ML (10ML) SYRINGE FOR IV PUSH (FOR BLOOD PRESSURE SUPPORT)
PREFILLED_SYRINGE | INTRAVENOUS | Status: DC | PRN
  Administered 2020-07-22: 80 ug via INTRAVENOUS

## 2020-07-22 MED ORDER — POLYETHYLENE GLYCOL 3350 17 G PO PACK: 17.0000 g | PACK | Freq: Every day | ORAL | Status: DC | PRN

## 2020-07-22 MED ORDER — DOCUSATE SODIUM 100 MG PO CAPS
100.0000 mg | ORAL_CAPSULE | Freq: Two times a day (BID) | ORAL | Status: DC
  Administered 2020-07-23 – 2020-07-24 (×3): 100 mg via ORAL
  Filled 2020-07-22 (×4): qty 1

## 2020-07-22 MED ORDER — FENTANYL CITRATE (PF) 250 MCG/5ML IJ SOLN
INTRAMUSCULAR | Status: DC | PRN
  Administered 2020-07-22 (×2): 25 ug via INTRAVENOUS

## 2020-07-22 MED ORDER — PROPOFOL 10 MG/ML IV BOLUS
INTRAVENOUS | Status: AC
  Filled 2020-07-22: qty 20

## 2020-07-22 MED ORDER — FENTANYL CITRATE (PF) 100 MCG/2ML IJ SOLN: 25.0000 ug | INTRAMUSCULAR | Status: DC | PRN

## 2020-07-22 MED ORDER — CLOPIDOGREL BISULFATE 75 MG PO TABS
75.0000 mg | ORAL_TABLET | Freq: Every day | ORAL | Status: DC
  Administered 2020-07-23 – 2020-07-24 (×2): 75 mg via ORAL
  Filled 2020-07-22 (×2): qty 1

## 2020-07-22 MED ORDER — ACETAMINOPHEN 650 MG RE SUPP: 650.0000 mg | Freq: Four times a day (QID) | RECTAL | Status: DC | PRN

## 2020-07-22 MED ORDER — ONDANSETRON HCL 4 MG/2ML IJ SOLN
INTRAMUSCULAR | Status: DC | PRN
  Administered 2020-07-22: 4 mg via INTRAVENOUS

## 2020-07-22 MED ORDER — OXYCODONE HCL 5 MG PO TABS: 5.0000 mg | ORAL_TABLET | Freq: Once | ORAL | Status: DC | PRN

## 2020-07-22 MED ORDER — INSULIN GLARGINE 100 UNIT/ML ~~LOC~~ SOLN
16.0000 [IU] | Freq: Two times a day (BID) | SUBCUTANEOUS | Status: AC
  Administered 2020-07-22 (×2): 16 [IU] via SUBCUTANEOUS
  Filled 2020-07-22 (×2): qty 0.16

## 2020-07-22 SURGICAL SUPPLY — 44 items
APL PRP STRL LF DISP 70% ISPRP (MISCELLANEOUS) ×1
BAG SPEC THK2 15X12 ZIP CLS (MISCELLANEOUS)
BAG ZIPLOCK 12X15 (MISCELLANEOUS) IMPLANT
BIT DRILL 4.3MMS DISTAL GRDTED (BIT) ×1 IMPLANT
BIT DRILL LAG SCREW (DRILL) ×1 IMPLANT
BNDG GAUZE ELAST 4 BULKY (GAUZE/BANDAGES/DRESSINGS) ×2 IMPLANT
BOOTIES KNEE HIGH SLOAN (MISCELLANEOUS) ×2 IMPLANT
CHLORAPREP W/TINT 26 (MISCELLANEOUS) ×2 IMPLANT
COVER PERINEAL POST (MISCELLANEOUS) ×2 IMPLANT
COVER SURGICAL LIGHT HANDLE (MISCELLANEOUS) ×2 IMPLANT
COVER WAND RF STERILE (DRAPES) IMPLANT
DRAPE C-ARM 42X120 X-RAY (DRAPES) ×2 IMPLANT
DRAPE SHEET LG 3/4 BI-LAMINATE (DRAPES) ×2 IMPLANT
DRAPE STERI IOBAN 125X83 (DRAPES) ×2 IMPLANT
DRESSING MEPILEX FLEX 4X4 (GAUZE/BANDAGES/DRESSINGS) ×2 IMPLANT
DRILL 4.3MMS DISTAL GRADUATED (BIT) ×2
DRILL LAG SCREW (DRILL) ×2
DRSG MEPILEX BORDER 4X8 (GAUZE/BANDAGES/DRESSINGS) ×2 IMPLANT
DRSG MEPILEX FLEX 4X4 (GAUZE/BANDAGES/DRESSINGS) ×4
DRSG TEGADERM 4X4.75 (GAUZE/BANDAGES/DRESSINGS) ×8 IMPLANT
ELECT REM PT RETURN 15FT ADLT (MISCELLANEOUS) ×2 IMPLANT
GAUZE SPONGE 4X4 12PLY STRL (GAUZE/BANDAGES/DRESSINGS) ×2 IMPLANT
GLOVE SRG 8 PF TXTR STRL LF DI (GLOVE) ×1 IMPLANT
GLOVE SURG ENC TEXT LTX SZ7.5 (GLOVE) ×2 IMPLANT
GLOVE SURG UNDER POLY LF SZ8 (GLOVE) ×2
GOWN STRL REUS W/TWL XL LVL3 (GOWN DISPOSABLE) ×2 IMPLANT
GUIDEPIN 3.2X17.5 THRD DISP (PIN) ×4 IMPLANT
GUIDEWIRE BALL NOSE 100CM (WIRE) ×2 IMPLANT
HFN LH 130 DEG 9MM X 360MM (Nail) ×2 IMPLANT
KIT BASIN OR (CUSTOM PROCEDURE TRAY) ×2 IMPLANT
KIT TURNOVER KIT A (KITS) ×2 IMPLANT
MANIFOLD NEPTUNE II (INSTRUMENTS) ×2 IMPLANT
PACK GENERAL/GYN (CUSTOM PROCEDURE TRAY) ×2 IMPLANT
PENCIL SMOKE EVACUATOR (MISCELLANEOUS) IMPLANT
PROTECTOR NERVE ULNAR (MISCELLANEOUS) ×2 IMPLANT
SCREW BONE CORTICAL 5.0X38 (Screw) ×2 IMPLANT
SCREW LAG HIP NAIL 10.5X95 (Screw) ×2 IMPLANT
SCREWDRIVER HEX TIP 3.5MM (MISCELLANEOUS) ×2 IMPLANT
STAPLER VISISTAT 35W (STAPLE) ×2 IMPLANT
SUT MNCRL AB 3-0 PS2 18 (SUTURE) ×2 IMPLANT
SUT MON AB 2-0 CT1 36 (SUTURE) ×2 IMPLANT
SUT PDS AB 2-0 CT2 27 (SUTURE) ×2 IMPLANT
SUT VICRYL 0 27 CT2 27 ABS (SUTURE) ×2 IMPLANT
TOWEL OR 17X26 10 PK STRL BLUE (TOWEL DISPOSABLE) ×2 IMPLANT

## 2020-07-22 NOTE — ED Notes (Signed)
Report given to Blaine Hamper RN. Pt accepted to [facility] room 1345. Pt picked up by BLS. Spouse floowing pt and ambulance over to Hospital. Pt stable at time of transfer

## 2020-07-22 NOTE — Anesthesia Procedure Notes (Signed)
Procedure Name: Intubation Performed by: Rosaland Lao, CRNA Pre-anesthesia Checklist: Patient identified, Emergency Drugs available, Suction available and Patient being monitored Patient Re-evaluated:Patient Re-evaluated prior to induction Oxygen Delivery Method: Circle system utilized Preoxygenation: Pre-oxygenation with 100% oxygen Induction Type: IV induction Ventilation: Mask ventilation without difficulty Laryngoscope Size: Miller and 2 Grade View: Grade I Tube type: Oral Tube size: 7.0 mm Number of attempts: 1 Airway Equipment and Method: Stylet Placement Confirmation: ETT inserted through vocal cords under direct vision,  positive ETCO2 and breath sounds checked- equal and bilateral Secured at: 21 cm Tube secured with: Tape Dental Injury: Teeth and Oropharynx as per pre-operative assessment

## 2020-07-22 NOTE — ED Notes (Signed)
Report given to [facility]. 

## 2020-07-22 NOTE — Consult Note (Signed)
Reason for Consult: Left intertrochanteric hip fracture in the setting of left-sided weakness Referring Physician: [facility]  Danielle [name] is an 87 y.o. female.  HPI:87 year old female presented to the ED after a fall. She has a history of type 2 diabetes mellitus, stroke with left-sided weakness, paralysis of left upper extremity, known carotid artery stenosis, hypothyroidism, hypertension, hyperlipidemia. Had a fall at home yesterday.  X-rays in the emergency department were concerning for acute intertrochanteric hip fracture with possible reverse obliquity.  She also has evidence of prior healed pubic rami fractures.  Orthopedics was consulted.  She has pain in her left hip but denies pain elsewhere.  Denies numbness or tingling.  Pain is sharp in quality and severe.  She takes Plavix at baseline.  When asked why she takes Plavix she said "my blood is thin."    Past Medical History:  Diagnosis Date  . Arthritis    "some joint pain once in awhile" (05/26/2017)  . Back pain at L4-L5 level 06/14/2013  . Cataract   . CVA (cerebral vascular accident) ([place]) 2019  . Encounter for screening mammogram for malignant neoplasm of breast 04/19/2019  . Generalized weakness 06/09/2018  . Heart murmur   . History of kidney stones   . Hyperlipemia   . Hypertension   . Hypothyroidism (acquired)   . Muscle weakness (generalized) 12/17/2012  . Osteoporosis   . Pain in joint, shoulder region 12/17/2012  . Pneumothorax 02/05/2017   right-after fall  . Proximal humerus fracture 11/29/2012  . Rib fractures 02/05/2017   right side  . 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 Shoulder fracture 12/14/2012  . Stroke ([place])   . Stroke-like symptoms 12/23/2018  . Type II diabetes mellitus ([place])     Past Surgical History:  Procedure Laterality Date  . AUGMENTATION MAMMAPLASTY Bilateral   . COSMETIC SURGERY    . DILATION AND CURETTAGE OF UTERUS    . EYE SURGERY    . FRACTURE  SURGERY    . JOINT REPLACEMENT    . REVERSE SHOULDER ARTHROPLASTY Right 05/26/2017  . REVERSE SHOULDER ARTHROPLASTY Right 05/26/2017   Procedure: REVERSE RIGHT SHOULDER ARTHROPLASTY;  Surgeon: Meredith Pel, MD;  Location: [facility];  Service: Orthopedics;  Laterality: Right;  . TONSILLECTOMY    . TUBAL LIGATION      Family History  Problem Relation Age of Onset  . Cancer Mother   . Heart disease Father     Social History:  reports that she quit smoking about 31 years ago. Her smoking use included cigarettes. She has a 40.00 pack-year smoking history. She has never used smokeless tobacco. She reports that she does not drink alcohol and does not use drugs.  Allergies:  Allergies  Allergen Reactions  . Fosamax [Alendronate Sodium] Other (See Comments)    MUSCLE ACHES  . Prednisone     Feels jittery and nauseous    Medications: I have reviewed the patient's current medications.  Results for orders placed or performed during the hospital encounter of 07/21/20 (from the past 48 hour(s))  CBC with Differential     Status: Abnormal   Collection Time: 07/21/20  9:39 PM  Result Value Ref Range   WBC 11.8 (H) 4.0 - 10.5 K/uL   RBC 4.12 3.87 - 5.11 MIL/uL   Hemoglobin 11.8 (L) 12.0 - 15.0 g/dL   HCT 37.2 36.0 - 46.0 %   MCV 90.3 80.0 - 100.0 fL   MCH 28.6 26.0 -  34.0 pg   MCHC 31.7 30.0 - 36.0 g/dL   RDW 17.4 (H) 11.5 - 15.5 %   Platelets 293 150 - 400 K/uL   nRBC 0.0 0.0 - 0.2 %   Neutrophils Relative % 73 %   Neutro Abs 8.8 (H) 1.7 - 7.7 K/uL   Lymphocytes Relative 15 %   Lymphs Abs 1.7 0.7 - 4.0 K/uL   Monocytes Relative 8 %   Monocytes Absolute 1.0 0.1 - 1.0 K/uL   Eosinophils Relative 2 %   Eosinophils Absolute 0.2 0.0 - 0.5 K/uL   Basophils Relative 1 %   Basophils Absolute 0.1 0.0 - 0.1 K/uL   Immature Granulocytes 1 %   Abs Immature Granulocytes 0.07 0.00 - 0.07 K/uL    Comment: Performed at [facility], [address] [place], [place] [address]  Basic metabolic  panel     Status: Abnormal   Collection Time: 07/21/20  9:39 PM  Result Value Ref Range   Sodium 138 135 - 145 mmol/L   Potassium 3.4 (L) 3.5 - 5.1 mmol/L   Chloride 107 98 - 111 mmol/L   CO2 20 (L) 22 - 32 mmol/L   Glucose, Bld 88 70 - 99 mg/dL    Comment: Glucose reference range applies only to samples taken after fasting for at least 8 hours.   BUN 24 (H) 8 - 23 mg/dL   Creatinine, Ser 1.03 (H) 0.44 - 1.00 mg/dL   Calcium 9.5 8.9 - 10.3 mg/dL   GFR, Estimated 53 (L) >60 mL/min    Comment: (NOTE) Calculated using the CKD-EPI Creatinine Equation (2021)    Anion gap 11 5 - 15    Comment: Performed at [facility], [address], [place], [place] [address]  Resp Panel by RT-PCR (Flu A&B, Covid) Nasopharyngeal Swab     Status: None   Collection Time: 07/21/20 11:00 PM   Specimen: Nasopharyngeal Swab; Nasopharyngeal(NP) swabs in vial transport medium  Result Value Ref Range   SARS Coronavirus 2 by RT PCR NEGATIVE NEGATIVE    Comment: (NOTE) SARS-CoV-2 target nucleic acids are NOT DETECTED.  The SARS-CoV-2 RNA is generally detectable in upper respiratory specimens during the acute phase of infection. The lowest concentration of SARS-CoV-2 viral copies this assay can detect is 138 copies/mL. A negative result does not preclude SARS-Cov-2 infection and should not be used as the sole basis for treatment or other patient management decisions. A negative result may occur with  improper specimen collection/handling, submission of specimen other than nasopharyngeal swab, presence of viral mutation(s) within the areas targeted by this assay, and inadequate number of viral copies(<138 copies/mL). A negative result must be combined with clinical observations, patient history, and epidemiological information. The expected result is Negative.  Fact Sheet for Patients:  EntrepreneurPulse.com.au  Fact Sheet for Healthcare Providers:   IncredibleEmployment.be  This test is no t yet approved or cleared by the [place] FDA and  has been authorized for detection and/or diagnosis of SARS-CoV-2 by FDA under an Emergency Use Authorization (EUA). This EUA will remain  in effect (meaning this test can be used) for the duration of the COVID-19 declaration under Section 564(b)(1) of the Act, 21 U.S.C.section 360bbb-3(b)(1), unless the authorization is terminated  or revoked sooner.       Influenza A by PCR NEGATIVE NEGATIVE   Influenza B by PCR NEGATIVE NEGATIVE    Comment: (NOTE) The Xpert Xpress SARS-CoV-2/FLU/RSV plus assay is intended as an aid in the diagnosis of influenza from  Nasopharyngeal swab specimens and should not be used as a sole basis for treatment. Nasal washings and aspirates are unacceptable for Xpert Xpress SARS-CoV-2/FLU/RSV testing.  Fact Sheet for Patients: EntrepreneurPulse.com.au  Fact Sheet for Healthcare Providers: IncredibleEmployment.be  This test is not yet approved or cleared by the [place] FDA and has been authorized for detection and/or diagnosis of SARS-CoV-2 by FDA under an Emergency Use Authorization (EUA). This EUA will remain in effect (meaning this test can be used) for the duration of the COVID-19 declaration under Section 564(b)(1) of the Act, 21 U.S.C. section 360bbb-3(b)(1), unless the authorization is terminated or revoked.  Performed at [facility], [address] [place], [place] [address]   Magnesium     Status: None   Collection Time: 07/22/20  3:09 AM  Result Value Ref Range   Magnesium 1.7 1.7 - 2.4 mg/dL    Comment: Performed at [facility], [place] [address], [place], [place] [address]  CBC WITH DIFFERENTIAL     Status: Abnormal   Collection Time: 07/22/20  3:09 AM  Result Value Ref Range   WBC 11.4 (H) 4.0 - 10.5 K/uL   RBC 3.97 3.87 - 5.11 MIL/uL   Hemoglobin 11.4 (L) 12.0 -  15.0 g/dL   HCT 35.1 (L) 36.0 - 46.0 %   MCV 88.4 80.0 - 100.0 fL   MCH 28.7 26.0 - 34.0 pg   MCHC 32.5 30.0 - 36.0 g/dL   RDW 17.1 (H) 11.5 - 15.5 %   Platelets 264 150 - 400 K/uL   nRBC 0.0 0.0 - 0.2 %   Neutrophils Relative % 82 %   Neutro Abs 9.2 (H) 1.7 - 7.7 K/uL   Lymphocytes Relative 11 %   Lymphs Abs 1.2 0.7 - 4.0 K/uL   Monocytes Relative 7 %   Monocytes Absolute 0.8 0.1 - 1.0 K/uL   Eosinophils Relative 0 %   Eosinophils Absolute 0.0 0.0 - 0.5 K/uL   Basophils Relative 0 %   Basophils Absolute 0.0 0.0 - 0.1 K/uL   Immature Granulocytes 0 %   Abs Immature Granulocytes 0.05 0.00 - 0.07 K/uL    Comment: Performed at [facility], [place] [address], [place], [place] [address]  TSH     Status: Abnormal   Collection Time: 07/22/20  3:09 AM  Result Value Ref Range   TSH 0.076 (L) 0.350 - 4.500 uIU/mL    Comment: Performed by a 3rd Generation assay with a functional sensitivity of <=0.01 uIU/mL. Performed at [facility], [place] [address], [place], [place] [address]   Comprehensive metabolic panel     Status: Abnormal   Collection Time: 07/22/20  3:09 AM  Result Value Ref Range   Sodium 138 135 - 145 mmol/L   Potassium 4.1 3.5 - 5.1 mmol/L   Chloride 108 98 - 111 mmol/L   CO2 21 (L) 22 - 32 mmol/L   Glucose, Bld 333 (H) 70 - 99 mg/dL    Comment: Glucose reference range applies only to samples taken after fasting for at least 8 hours.   BUN 24 (H) 8 - 23 mg/dL   Creatinine, Ser 0.92 0.44 - 1.00 mg/dL   Calcium 9.3 8.9 - 10.3 mg/dL   Total Protein 6.0 (L) 6.5 - 8.1 g/dL   Albumin 3.4 (L) 3.5 - 5.0 g/dL   AST 32 15 - 41 U/L   ALT 23 0 - 44 U/L   Alkaline Phosphatase 93 38 - 126 U/L  Total Bilirubin 0.9 0.3 - 1.2 mg/dL   GFR, Estimated >60 >60 mL/min    Comment: (NOTE) Calculated using the CKD-EPI Creatinine Equation (2021)    Anion gap 9 5 - 15    Comment: Performed at [facility], [place] [address]  [place], [place] [address]  Glucose, capillary     Status: Abnormal   Collection Time: 07/22/20  3:13 AM  Result Value Ref Range   Glucose-Capillary 304 (H) 70 - 99 mg/dL    Comment: Glucose reference range applies only to samples taken after fasting for at least 8 hours.  Urinalysis, Routine w reflex microscopic Urine, Clean Catch     Status: Abnormal   Collection Time: 07/22/20  4:15 AM  Result Value Ref Range   Color, Urine YELLOW YELLOW   APPearance CLEAR CLEAR   Specific Gravity, Urine 1.022 1.005 - 1.030   pH 5.0 5.0 - 8.0   Glucose, UA >=500 (A) NEGATIVE mg/dL   Hgb urine dipstick NEGATIVE NEGATIVE   Bilirubin Urine NEGATIVE NEGATIVE   Ketones, ur 20 (A) NEGATIVE mg/dL   Protein, ur NEGATIVE NEGATIVE mg/dL   Nitrite POSITIVE (A) NEGATIVE   Leukocytes,Ua NEGATIVE NEGATIVE   RBC / HPF 0-5 0 - 5 RBC/hpf   WBC, UA 6-10 0 - 5 WBC/hpf   Bacteria, UA RARE (A) NONE SEEN   Squamous Epithelial / LPF 0-5 0 - 5   Mucus PRESENT     Comment: Performed at [facility], [place] [address], [place], [place] [address]  Glucose, capillary     Status: Abnormal   Collection Time: 07/22/20  7:33 AM  Result Value Ref Range   Glucose-Capillary 392 (H) 70 - 99 mg/dL    Comment: Glucose reference range applies only to samples taken after fasting for at least 8 hours.  Glucose, capillary     Status: Abnormal   Collection Time: 07/22/20 10:03 AM  Result Value Ref Range   Glucose-Capillary 272 (H) 70 - 99 mg/dL    Comment: Glucose reference range applies only to samples taken after fasting for at least 8 hours.  Glucose, capillary     Status: Abnormal   Collection Time: 07/22/20 11:52 AM  Result Value Ref Range   Glucose-Capillary 270 (H) 70 - 99 mg/dL    Comment: Glucose reference range applies only to samples taken after fasting for at least 8 hours.    DG Chest 1 View  Result Date: 07/21/2020 CLINICAL DATA:  Fall, left hip fracture EXAM: CHEST  1 VIEW COMPARISON:  07/16/2018  FINDINGS: Lungs are clear. No pneumothorax or pleural effusion. Cardiac size within normal limits. Pulmonary vascularity is normal. Bilateral breast implants are noted. Right total shoulder arthroplasty has been performed. No acute bone abnormality. IMPRESSION: No active disease. Electronically Signed   By: Fidela Salisbury MD   On: 07/21/2020 23:00   CT Head Wo Contrast  Result Date: 07/21/2020 CLINICAL DATA:  Pt had a witnessed fal at home Pt axo x3. Pt with left lateral puncture wound on the back of skill. Pt reports left leg pain . Assessment with left leg rotated outward and upward . Pain 3/10. EXAM: CT HEAD WITHOUT CONTRAST CT CERVICAL SPINE WITHOUT CONTRAST TECHNIQUE: Multidetector CT imaging of the head and cervical spine was performed following the standard protocol without intravenous contrast. Multiplanar CT image reconstructions of the cervical spine were also generated. COMPARISON:  CT angio head and neck 06/26/2020, MR head 06/26/2020 FINDINGS: CT HEAD FINDINGS Brain: Patchy and  confluent areas of decreased attenuation are noted throughout the deep and periventricular white matter of the cerebral hemispheres bilaterally, compatible with chronic microvascular ischemic disease. Chronic right middle cerebral artery infarction. No evidence of large-territorial acute infarction. No parenchymal hemorrhage. No mass lesion. No extra-axial collection. A 3cm isodense left frontal parafalcine mass is consistent with known meningioma. No hydrocephalus. Basilar cisterns are patent. Vascular: No hyperdense vessel. Atherosclerotic calcifications are present within the cavernous internal carotid arteries. Skull: No acute fracture or focal lesion. Sinuses/Orbits: Paranasal sinuses and mastoid air cells are clear. Bilateral lens replacement. Otherwise the orbits are unremarkable. Other: None. CT CERVICAL SPINE FINDINGS Alignment: Similar appearing grade 1 anterolisthesis of C4 on C5. similar-appearing mild  retrolisthesis of C6 on C7. Skull base and vertebrae: Redemonstration of partial fusion of the C5-C6 vertebral bodies. Redemonstration of severe multilevel degenerative changes of the spine. No acute fracture. No aggressive appearing focal osseous lesion or focal pathologic process. Soft tissues and spinal canal: No prevertebral fluid or swelling. No visible canal hematoma. Upper chest: Biapical pleural/pulmonary scarring. Other: Severe atherosclerotic plaque of the carotid arteries within the neck. Old healed right clavicular fracture. IMPRESSION: 1. No acute intracranial abnormality in a patient with known prior right middle cerebral artery infarction and 3 cm left frontal parafalcine meningioma. 2. No acute displaced fracture or traumatic listhesis of the cervical spine. 3. Severe atherosclerotic plaque of the carotid arteries within the neck. Recommend carotid ultrasound further evaluation. Electronically Signed   By: Iven Finn M.D.   On: 07/21/2020 22:25   CT Cervical Spine Wo Contrast  Result Date: 07/21/2020 CLINICAL DATA:  Pt had a witnessed fal at home Pt axo x3. Pt with left lateral puncture wound on the back of skill. Pt reports left leg pain . Assessment with left leg rotated outward and upward . Pain 3/10. EXAM: CT HEAD WITHOUT CONTRAST CT CERVICAL SPINE WITHOUT CONTRAST TECHNIQUE: Multidetector CT imaging of the head and cervical spine was performed following the standard protocol without intravenous contrast. Multiplanar CT image reconstructions of the cervical spine were also generated. COMPARISON:  CT angio head and neck 06/26/2020, MR head 06/26/2020 FINDINGS: CT HEAD FINDINGS Brain: Patchy and confluent areas of decreased attenuation are noted throughout the deep and periventricular white matter of the cerebral hemispheres bilaterally, compatible with chronic microvascular ischemic disease. Chronic right middle cerebral artery infarction. No evidence of large-territorial acute infarction.  No parenchymal hemorrhage. No mass lesion. No extra-axial collection. A 3cm isodense left frontal parafalcine mass is consistent with known meningioma. No hydrocephalus. Basilar cisterns are patent. Vascular: No hyperdense vessel. Atherosclerotic calcifications are present within the cavernous internal carotid arteries. Skull: No acute fracture or focal lesion. Sinuses/Orbits: Paranasal sinuses and mastoid air cells are clear. Bilateral lens replacement. Otherwise the orbits are unremarkable. Other: None. CT CERVICAL SPINE FINDINGS Alignment: Similar appearing grade 1 anterolisthesis of C4 on C5. similar-appearing mild retrolisthesis of C6 on C7. Skull base and vertebrae: Redemonstration of partial fusion of the C5-C6 vertebral bodies. Redemonstration of severe multilevel degenerative changes of the spine. No acute fracture. No aggressive appearing focal osseous lesion or focal pathologic process. Soft tissues and spinal canal: No prevertebral fluid or swelling. No visible canal hematoma. Upper chest: Biapical pleural/pulmonary scarring. Other: Severe atherosclerotic plaque of the carotid arteries within the neck. Old healed right clavicular fracture. IMPRESSION: 1. No acute intracranial abnormality in a patient with known prior right middle cerebral artery infarction and 3 cm left frontal parafalcine meningioma. 2. No acute displaced fracture or traumatic listhesis  of the cervical spine. 3. Severe atherosclerotic plaque of the carotid arteries within the neck. Recommend carotid ultrasound further evaluation. Electronically Signed   By: Iven Finn M.D.   On: 07/21/2020 22:25   DG Hip Unilat W or Wo Pelvis 2-3 Views Left  Result Date: 07/21/2020 CLINICAL DATA:  Fall, left hip pain EXAM: DG HIP (WITH OR WITHOUT PELVIS) 2-3V LEFT COMPARISON:  None. FINDINGS: Single view radiograph of the pelvis and two view radiograph of the left hip demonstrates an acute, mildly displaced, mildly medially angulated  intratrochanteric fracture of the left femur. The femoral head is still seated within the left acetabulum. At least moderate left hip degenerative arthritis. There are subacute fractures of the right superior and inferior pubic rami. The left pubic rami are not well assessed due to obliquity on this examination. IMPRESSION: Acute angulated, displaced left intratrochanteric hip fracture. Left femoral head is still seated within the left acetabulum. Subacute fractures of the right superior and inferior pubic rami. Electronically Signed   By: Fidela Salisbury MD   On: 07/21/2020 22:58   DG HIP UNILAT WITH PELVIS MIN 4 VIEWS LEFT  Result Date: 07/22/2020 CLINICAL DATA:  Left hip fracture.  Surgery planned for today. EXAM: DG HIP (WITH OR WITHOUT PELVIS) 4+V LEFT COMPARISON:  None. FINDINGS: The known comminuted left intertrochanteric fracture is identified. No dislocation. Subacute to chronic fractures of the inferior pubic rami identified. No other acute abnormalities. IMPRESSION: 1. No significant change in the comminuted left intertrochanteric fracture. 2. Subacute to chronic fractures with callus formation in the inferior pubic rami bilaterally. Probable subacute fracture in the lateral right superior pubic ramus. Electronically Signed   By: Dorise Bullion III M.D   On: 07/22/2020 09:58   DG FEMUR MIN 2 VIEWS LEFT  Result Date: 07/22/2020 CLINICAL DATA:  Left hip fracture. EXAM: LEFT FEMUR 2 VIEWS COMPARISON:  None. FINDINGS: The known left hip fracture is again identified. No dislocation of the femoral head identified. The remainder of the femur is intact. The proximal tibia and fibular intact. IMPRESSION: Known left hip fracture. No other acute fractures identified in the left femur. There appears to be a subacute fracture of the left inferior pubic ramus. Electronically Signed   By: Dorise Bullion III M.D   On: 07/22/2020 09:55    Review of Systems  Constitutional: Negative.   HENT: Negative.    Respiratory: Negative.   Cardiovascular: Negative.   Gastrointestinal: Negative.   Musculoskeletal:       Left hip pain  Skin: Negative.   Neurological: Negative.   Psychiatric/Behavioral: Negative.    Blood pressure 140/63, pulse (!) 103, temperature 98.9 F (37.2 C), temperature source Oral, resp. rate 18, height 5\' 5"  (1.651 m), weight 67.9 kg, SpO2 95 %. Physical Exam HENT:     Head: Normocephalic.     Mouth/Throat:     Mouth: Mucous membranes are moist.  Eyes:     Extraocular Movements: Extraocular movements intact.  Cardiovascular:     Rate and Rhythm: Tachycardia present.     Pulses: Normal pulses.  Pulmonary:     Effort: Pulmonary effort is normal.  Abdominal:     General: Abdomen is flat.  Musculoskeletal:     Cervical back: Neck supple.     Comments: Pain with attempted manipulation of the left hip.  Mild discomfort distally about the thigh but no deformity or crepitance noted.  No tenderness about the leg, ankle or foot.  She is able to wiggle her toes.  No evidence of injury to left upper or right upper extremity or right lower extremity.  Skin:    General: Skin is warm.  Neurological:     Mental Status: She is alert. She is disoriented.  Psychiatric:        Mood and Affect: Mood normal.     Assessment/Plan: Patient has a left intertrochanteric hip fracture.  We will plan for intramedullary nail fixation.  This was discussed with the patient today.  She is amenable to surgery.  We discussed the risks, benefits and alternatives to surgery which include but are not limited to wound healing complications, infection, nonunion, malunion, need for further surgery or damage surrounding structures.  She consented to proceed with surgery.  There is no family at bedside.      Erle Crocker 07/22/2020, 12:11 PM

## 2020-07-22 NOTE — Plan of Care (Signed)
  Problem: Education: Goal: Knowledge of General Education information will improve Description: Including pain rating scale, medication(s)/side effects and non-pharmacologic comfort measures Outcome: Progressing   Problem: Activity: Goal: Risk for activity intolerance will decrease Outcome: Progressing   Problem: Nutrition: Goal: Adequate nutrition will be maintained Outcome: Progressing   Problem: Elimination: Goal: Will not experience complications related to bowel motility Outcome: Progressing   Problem: Pain Managment: Goal: General experience of comfort will improve Outcome: Progressing   Problem: Education: Goal: Verbalization of understanding the information provided (i.e., activity precautions, restrictions, etc) will improve Outcome: Progressing   Problem: Activity: Goal: Ability to ambulate and perform ADLs will improve Outcome: Progressing   Problem: Pain Management: Goal: Pain level will decrease Outcome: Progressing

## 2020-07-22 NOTE — H&P (Signed)
TRH H&P    Patient Demographics:    Danielle [name], is a 87 y.o. female  MRN: [id number]  DOB - Oct 10, 1931  Admit Date - 07/21/2020  Referring MD/NP/PA: Rogene Houston  Outpatient Primary MD for the patient is Perlie Mayo, NP  Patient coming from: Home  Chief complaint-fall   HPI:    Danielle [name]  is a 87 y.o. female, with history of type 2 diabetes mellitus, stroke with left-sided weakness, paralysis of left upper extremity, known carotid artery stenosis, hypothyroidism, hypertension, hyperlipidemia, multiple falls presents the ED with a chief complaint of fall.  Patient reports that she got out of bed and was walking down the hall she went to turn around, lost her balance, fell back onto her buttocks, then back then her head hit the floor.  She reports no preceding symptoms including no chest pain, no shortness of breath, no palpitations, no syncope or near syncope, no dizziness.  She reports no dizziness after the hitting her head either.  She did not lose consciousness.  Patient is supposed to walk with a cane and the assistance of her husband.  She was not using either at the time of her fall.  She was not able to get up on her own, but due to her left-sided weakness she is normally not able to get up on her own.  They called EMS who got her into a gurney and brought her into the ER.  Patient reports that otherwise she was in her normal state of health.  Patient does not smoke, does not drink, does not use illicit drugs.  She is vaccinated for COVID and she has a DNR on file.  Husband is POA.  In the ED Temp 98, heart rate 141, respiratory rate 18, blood pressure 150/67, satting 100% Mild leukocytosis with white blood cell count of 11.8, hemoglobin 11.8 Chemistry panel shows a mild hypokalemia at 3.4 CT C-spine and head shows no acute intracranial abnormality with prior right middle cerebral artery  infarct, no acute displaced fractures severe atherosclerotic plaque of the carotid arteries within the neck X-ray chest shows no active disease X-ray hip shows acute angulated displaced left intertrochanteric fracture with left femoral head seated within the acetabulum, subacute fracture right superior and inferior pubic rami Patient received fentanyl, Xylocaine, Zofran, Tdap in the ER Admission requested for orthopedic evaluation     Review of systems:    In addition to the HPI above,  No Fever-chills, No Headache, No changes with Vision or hearing, No problems swallowing food or Liquids, No Chest pain, Cough or Shortness of Breath, No Abdominal pain, No Nausea or Vomiting, bowel movements are regular, No Blood in stool or Urine, No dysuria, No new skin rashes or bruises, No new joints pains-aches,  No new weakness, tingling, numbness in any extremity, patient does have chronic left-sided weakness No recent weight gain or loss, No polyuria, polydypsia or polyphagia, No significant Mental Stressors.  All other systems reviewed and are negative.    Past History of the following :    Past  Medical History:  Diagnosis Date  . Arthritis    "some joint pain once in awhile" (05/26/2017)  . Back pain at L4-L5 level 06/14/2013  . Cataract   . CVA (cerebral vascular accident) ([place]) 2019  . Encounter for screening mammogram for malignant neoplasm of breast 04/19/2019  . Generalized weakness 06/09/2018  . Heart murmur   . History of kidney stones   . Hyperlipemia   . Hypertension   . Hypothyroidism (acquired)   . Muscle weakness (generalized) 12/17/2012  . Osteoporosis   . Pain in joint, shoulder region 12/17/2012  . Pneumothorax 02/05/2017   right-after fall  . Proximal humerus fracture 11/29/2012  . Rib fractures 02/05/2017   right side  . 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 Shoulder fracture 12/14/2012  . Stroke ([place])   . Stroke-like symptoms 12/23/2018  . Type  II diabetes mellitus ([place])       Past Surgical History:  Procedure Laterality Date  . AUGMENTATION MAMMAPLASTY Bilateral   . COSMETIC SURGERY    . DILATION AND CURETTAGE OF UTERUS    . EYE SURGERY    . FRACTURE SURGERY    . JOINT REPLACEMENT    . REVERSE SHOULDER ARTHROPLASTY Right 05/26/2017  . REVERSE SHOULDER ARTHROPLASTY Right 05/26/2017   Procedure: REVERSE RIGHT SHOULDER ARTHROPLASTY;  Surgeon: Meredith Pel, MD;  Location: [facility];  Service: Orthopedics;  Laterality: Right;  . TONSILLECTOMY    . TUBAL LIGATION        Social History:      Social History   Tobacco Use  . Smoking status: Former Smoker    Packs/day: 1.00    Years: 40.00    Pack years: 40.00    Types: Cigarettes    Quit date: 1991    Years since quitting: 31.1  . Smokeless tobacco: Never Used  Substance Use Topics  . Alcohol use: No       Family History :     Family History  Problem Relation Age of Onset  . Cancer Mother   . Heart disease Father       Home Medications:   Prior to Admission medications   Medication Sig Start Date End Date Taking? Authorizing Provider  acetaminophen (TYLENOL) 325 MG tablet Take 2 tablets (650 mg total) by mouth every 6 (six) hours as needed for mild pain (or Fever >/= 101). 01/20/19  Yes Angiulli, Lavon Paganini, PA-C  amLODipine (NORVASC) 5 MG tablet TAKE 1 TABLET BY MOUTH EVERY DAY Patient taking differently: Take 5 mg by mouth daily. 06/11/20  Yes Perlie Mayo, NP  atorvastatin (LIPITOR) 80 MG tablet Take 1 tablet (80 mg total) by mouth daily at 6 PM. 05/08/20  Yes Perlie Mayo, NP  Calcium Carb-Cholecalciferol (CALCIUM 600 + D PO) Take 1 tablet by mouth daily.   Yes [provider]  clopidogrel (PLAVIX) 75 MG tablet Take 1 tablet (75 mg total) by mouth daily. 07/10/20  Yes Lindell Spar, MD  diclofenac sodium (VOLTAREN) 1 % GEL Apply 2 g topically 4 (four) times daily. 01/20/19  Yes Angiulli, Lavon Paganini, PA-C  insulin glargine, 2 Unit Dial, (TOUJEO  MAX SOLOSTAR) 300 UNIT/ML Solostar Pen Inject 32 Units into the skin daily before breakfast. 01/02/20  Yes Emokpae, Courage, MD  Insulin Lispro (HUMALOG KWIKPEN) 200 UNIT/ML SOPN Inject 10-12 Units into the skin in the morning and at bedtime.   Yes [provider]  levothyroxine (SYNTHROID) 100 MCG tablet TAKE 1 TABLET BY MOUTH  EVERY DAY IN THE MORNING Patient taking differently: Take 100 mcg by mouth in the morning. 05/29/20  Yes Perlie Mayo, NP  Multiple Vitamins-Minerals (OCUVITE ADULT 50+ PO) Take 1 tablet by mouth daily.    Yes [provider]  vitamin B-12 (CYANOCOBALAMIN) 1000 MCG tablet Take 1 tablet (1,000 mcg total) by mouth daily. 01/20/19  Yes Angiulli, Lavon Paganini, PA-C  glucose blood test strip Use as instructed check blood sugar three times daily as needed for machine pt already has 04/25/20   Perlie Mayo, NP     Allergies:     Allergies  Allergen Reactions  . Fosamax [Alendronate Sodium] Other (See Comments)    MUSCLE ACHES  . Prednisone     Feels jittery and nauseous     Physical Exam:   Vitals  Blood pressure 140/61, pulse 99, temperature 98 F (36.7 C), temperature source Oral, resp. rate 15, height 5\' 5"  (1.651 m), weight 64 kg, SpO2 97 %.  1.  General: Patient lying supine in bed no acute distress, chronically ill-appearing  2. Psychiatric: Flat affect, behavior is normal for situation, cooperative with exam  3. Neurologic: Face is symmetric, left arm paralysis, left leg weakness, normal speech and language, at patient's baseline  4. HEENMT:  Head is atraumatic, normocephalic, pupils reactive to light, neck is supple, trachea is midline, mucous membranes are moist  5. Respiratory : Lungs are clear to auscultation bilaterally without wheezing, rhonchi, rales, maintaining oxygen saturations on room air  6. Cardiovascular : Heart rate is normal, rhythm is regular, no murmurs rubs or gallops  7. Gastrointestinal:  Abdomen is soft,  nondistended, nontender to palpation  8. Skin:  Skin is warm dry and intact  9.Musculoskeletal:  Tenderness to palpation over left hip, left hip slightly flexed and slightly abducted, no peripheral edema, peripheral pulses palpated, no calf tenderness    Data Review:    CBC Recent Labs  Lab 07/21/20 2139  WBC 11.8*  HGB 11.8*  HCT 37.2  PLT 293  MCV 90.3  MCH 28.6  MCHC 31.7  RDW 17.4*  LYMPHSABS 1.7  MONOABS 1.0  EOSABS 0.2  BASOSABS 0.1   ------------------------------------------------------------------------------------------------------------------  Results for orders placed or performed during the hospital encounter of 07/21/20 (from the past 48 hour(s))  CBC with Differential     Status: Abnormal   Collection Time: 07/21/20  9:39 PM  Result Value Ref Range   WBC 11.8 (H) 4.0 - 10.5 K/uL   RBC 4.12 3.87 - 5.11 MIL/uL   Hemoglobin 11.8 (L) 12.0 - 15.0 g/dL   HCT 37.2 36.0 - 46.0 %   MCV 90.3 80.0 - 100.0 fL   MCH 28.6 26.0 - 34.0 pg   MCHC 31.7 30.0 - 36.0 g/dL   RDW 17.4 (H) 11.5 - 15.5 %   Platelets 293 150 - 400 K/uL   nRBC 0.0 0.0 - 0.2 %   Neutrophils Relative % 73 %   Neutro Abs 8.8 (H) 1.7 - 7.7 K/uL   Lymphocytes Relative 15 %   Lymphs Abs 1.7 0.7 - 4.0 K/uL   Monocytes Relative 8 %   Monocytes Absolute 1.0 0.1 - 1.0 K/uL   Eosinophils Relative 2 %   Eosinophils Absolute 0.2 0.0 - 0.5 K/uL   Basophils Relative 1 %   Basophils Absolute 0.1 0.0 - 0.1 K/uL   Immature Granulocytes 1 %   Abs Immature Granulocytes 0.07 0.00 - 0.07 K/uL    Comment: Performed at [facility], [address], [place], [place] [address]  Basic metabolic panel     Status: Abnormal   Collection Time: 07/21/20  9:39 PM  Result Value Ref Range   Sodium 138 135 - 145 mmol/L   Potassium 3.4 (L) 3.5 - 5.1 mmol/L   Chloride 107 98 - 111 mmol/L   CO2 20 (L) 22 - 32 mmol/L   Glucose, Bld 88 70 - 99 mg/dL    Comment: Glucose reference range applies only to samples taken after  fasting for at least 8 hours.   BUN 24 (H) 8 - 23 mg/dL   Creatinine, Ser 1.03 (H) 0.44 - 1.00 mg/dL   Calcium 9.5 8.9 - 10.3 mg/dL   GFR, Estimated 53 (L) >60 mL/min    Comment: (NOTE) Calculated using the CKD-EPI Creatinine Equation (2021)    Anion gap 11 5 - 15    Comment: Performed at [facility], [address], [place], [place] [address]  Resp Panel by RT-PCR (Flu A&B, Covid) Nasopharyngeal Swab     Status: None   Collection Time: 07/21/20 11:00 PM   Specimen: Nasopharyngeal Swab; Nasopharyngeal(NP) swabs in vial transport medium  Result Value Ref Range   SARS Coronavirus 2 by RT PCR NEGATIVE NEGATIVE    Comment: (NOTE) SARS-CoV-2 target nucleic acids are NOT DETECTED.  The SARS-CoV-2 RNA is generally detectable in upper respiratory specimens during the acute phase of infection. The lowest concentration of SARS-CoV-2 viral copies this assay can detect is 138 copies/mL. A negative result does not preclude SARS-Cov-2 infection and should not be used as the sole basis for treatment or other patient management decisions. A negative result may occur with  improper specimen collection/handling, submission of specimen other than nasopharyngeal swab, presence of viral mutation(s) within the areas targeted by this assay, and inadequate number of viral copies(<138 copies/mL). A negative result must be combined with clinical observations, patient history, and epidemiological information. The expected result is Negative.  Fact Sheet for Patients:  EntrepreneurPulse.com.au  Fact Sheet for Healthcare Providers:  IncredibleEmployment.be  This test is no t yet approved or cleared by the [place] FDA and  has been authorized for detection and/or diagnosis of SARS-CoV-2 by FDA under an Emergency Use Authorization (EUA). This EUA will remain  in effect (meaning this test can be used) for the duration of the COVID-19 declaration under Section  564(b)(1) of the Act, 21 U.S.C.section 360bbb-3(b)(1), unless the authorization is terminated  or revoked sooner.       Influenza A by PCR NEGATIVE NEGATIVE   Influenza B by PCR NEGATIVE NEGATIVE    Comment: (NOTE) The Xpert Xpress SARS-CoV-2/FLU/RSV plus assay is intended as an aid in the diagnosis of influenza from Nasopharyngeal swab specimens and should not be used as a sole basis for treatment. Nasal washings and aspirates are unacceptable for Xpert Xpress SARS-CoV-2/FLU/RSV testing.  Fact Sheet for Patients: EntrepreneurPulse.com.au  Fact Sheet for Healthcare Providers: IncredibleEmployment.be  This test is not yet approved or cleared by the [place] FDA and has been authorized for detection and/or diagnosis of SARS-CoV-2 by FDA under an Emergency Use Authorization (EUA). This EUA will remain in effect (meaning this test can be used) for the duration of the COVID-19 declaration under Section 564(b)(1) of the Act, 21 U.S.C. section 360bbb-3(b)(1), unless the authorization is terminated or revoked.  Performed at [facility], [address] [place], [place] [address]     Chemistries  Recent Labs  Lab 07/21/20 2139  NA 138  K 3.4*  CL  107  CO2 20*  GLUCOSE 88  BUN 24*  CREATININE 1.03*  CALCIUM 9.5   ------------------------------------------------------------------------------------------------------------------  ------------------------------------------------------------------------------------------------------------------ GFR: Estimated Creatinine Clearance: 35.3 mL/min (A) (by C-G formula based on SCr of 1.03 mg/dL (H)). Liver Function Tests: No results for input(s): AST, ALT, ALKPHOS, BILITOT, PROT, ALBUMIN in the last 168 hours. No results for input(s): LIPASE, AMYLASE in the last 168 hours. No results for input(s): AMMONIA in the last 168 hours. Coagulation Profile: No results for input(s): INR, PROTIME in the  last 168 hours. Cardiac Enzymes: No results for input(s): CKTOTAL, CKMB, CKMBINDEX, TROPONINI in the last 168 hours. BNP (last 3 results) No results for input(s): PROBNP in the last 8760 hours. HbA1C: No results for input(s): HGBA1C in the last 72 hours. CBG: No results for input(s): GLUCAP in the last 168 hours. Lipid Profile: No results for input(s): CHOL, HDL, LDLCALC, TRIG, CHOLHDL, LDLDIRECT in the last 72 hours. Thyroid Function Tests: No results for input(s): TSH, T4TOTAL, FREET4, T3FREE, THYROIDAB in the last 72 hours. Anemia Panel: No results for input(s): VITAMINB12, FOLATE, FERRITIN, TIBC, IRON, RETICCTPCT in the last 72 hours.  --------------------------------------------------------------------------------------------------------------- Urine analysis:    Component Value Date/Time   COLORURINE YELLOW 06/26/2020 1027   APPEARANCEUR CLEAR 06/26/2020 1027   LABSPEC 1.009 06/26/2020 1027   PHURINE 6.0 06/26/2020 1027   GLUCOSEU >=500 (A) 06/26/2020 1027   HGBUR NEGATIVE 06/26/2020 1027   BILIRUBINUR NEGATIVE 06/26/2020 1027   KETONESUR NEGATIVE 06/26/2020 1027   PROTEINUR NEGATIVE 06/26/2020 1027   NITRITE NEGATIVE 06/26/2020 1027   LEUKOCYTESUR LARGE (A) 06/26/2020 1027      Imaging Results:    DG Chest 1 View  Result Date: 07/21/2020 CLINICAL DATA:  Fall, left hip fracture EXAM: CHEST  1 VIEW COMPARISON:  07/16/2018 FINDINGS: Lungs are clear. No pneumothorax or pleural effusion. Cardiac size within normal limits. Pulmonary vascularity is normal. Bilateral breast implants are noted. Right total shoulder arthroplasty has been performed. No acute bone abnormality. IMPRESSION: No active disease. Electronically Signed   By: Fidela Salisbury MD   On: 07/21/2020 23:00   CT Head Wo Contrast  Result Date: 07/21/2020 CLINICAL DATA:  Pt had a witnessed fal at home Pt axo x3. Pt with left lateral puncture wound on the back of skill. Pt reports left leg pain . Assessment with left  leg rotated outward and upward . Pain 3/10. EXAM: CT HEAD WITHOUT CONTRAST CT CERVICAL SPINE WITHOUT CONTRAST TECHNIQUE: Multidetector CT imaging of the head and cervical spine was performed following the standard protocol without intravenous contrast. Multiplanar CT image reconstructions of the cervical spine were also generated. COMPARISON:  CT angio head and neck 06/26/2020, MR head 06/26/2020 FINDINGS: CT HEAD FINDINGS Brain: Patchy and confluent areas of decreased attenuation are noted throughout the deep and periventricular white matter of the cerebral hemispheres bilaterally, compatible with chronic microvascular ischemic disease. Chronic right middle cerebral artery infarction. No evidence of large-territorial acute infarction. No parenchymal hemorrhage. No mass lesion. No extra-axial collection. A 3cm isodense left frontal parafalcine mass is consistent with known meningioma. No hydrocephalus. Basilar cisterns are patent. Vascular: No hyperdense vessel. Atherosclerotic calcifications are present within the cavernous internal carotid arteries. Skull: No acute fracture or focal lesion. Sinuses/Orbits: Paranasal sinuses and mastoid air cells are clear. Bilateral lens replacement. Otherwise the orbits are unremarkable. Other: None. CT CERVICAL SPINE FINDINGS Alignment: Similar appearing grade 1 anterolisthesis of C4 on C5. similar-appearing mild retrolisthesis of C6 on C7. Skull base and vertebrae: Redemonstration of partial fusion of  the C5-C6 vertebral bodies. Redemonstration of severe multilevel degenerative changes of the spine. No acute fracture. No aggressive appearing focal osseous lesion or focal pathologic process. Soft tissues and spinal canal: No prevertebral fluid or swelling. No visible canal hematoma. Upper chest: Biapical pleural/pulmonary scarring. Other: Severe atherosclerotic plaque of the carotid arteries within the neck. Old healed right clavicular fracture. IMPRESSION: 1. No acute  intracranial abnormality in a patient with known prior right middle cerebral artery infarction and 3 cm left frontal parafalcine meningioma. 2. No acute displaced fracture or traumatic listhesis of the cervical spine. 3. Severe atherosclerotic plaque of the carotid arteries within the neck. Recommend carotid ultrasound further evaluation. Electronically Signed   By: Iven Finn M.D.   On: 07/21/2020 22:25   CT Cervical Spine Wo Contrast  Result Date: 07/21/2020 CLINICAL DATA:  Pt had a witnessed fal at home Pt axo x3. Pt with left lateral puncture wound on the back of skill. Pt reports left leg pain . Assessment with left leg rotated outward and upward . Pain 3/10. EXAM: CT HEAD WITHOUT CONTRAST CT CERVICAL SPINE WITHOUT CONTRAST TECHNIQUE: Multidetector CT imaging of the head and cervical spine was performed following the standard protocol without intravenous contrast. Multiplanar CT image reconstructions of the cervical spine were also generated. COMPARISON:  CT angio head and neck 06/26/2020, MR head 06/26/2020 FINDINGS: CT HEAD FINDINGS Brain: Patchy and confluent areas of decreased attenuation are noted throughout the deep and periventricular white matter of the cerebral hemispheres bilaterally, compatible with chronic microvascular ischemic disease. Chronic right middle cerebral artery infarction. No evidence of large-territorial acute infarction. No parenchymal hemorrhage. No mass lesion. No extra-axial collection. A 3cm isodense left frontal parafalcine mass is consistent with known meningioma. No hydrocephalus. Basilar cisterns are patent. Vascular: No hyperdense vessel. Atherosclerotic calcifications are present within the cavernous internal carotid arteries. Skull: No acute fracture or focal lesion. Sinuses/Orbits: Paranasal sinuses and mastoid air cells are clear. Bilateral lens replacement. Otherwise the orbits are unremarkable. Other: None. CT CERVICAL SPINE FINDINGS Alignment: Similar appearing  grade 1 anterolisthesis of C4 on C5. similar-appearing mild retrolisthesis of C6 on C7. Skull base and vertebrae: Redemonstration of partial fusion of the C5-C6 vertebral bodies. Redemonstration of severe multilevel degenerative changes of the spine. No acute fracture. No aggressive appearing focal osseous lesion or focal pathologic process. Soft tissues and spinal canal: No prevertebral fluid or swelling. No visible canal hematoma. Upper chest: Biapical pleural/pulmonary scarring. Other: Severe atherosclerotic plaque of the carotid arteries within the neck. Old healed right clavicular fracture. IMPRESSION: 1. No acute intracranial abnormality in a patient with known prior right middle cerebral artery infarction and 3 cm left frontal parafalcine meningioma. 2. No acute displaced fracture or traumatic listhesis of the cervical spine. 3. Severe atherosclerotic plaque of the carotid arteries within the neck. Recommend carotid ultrasound further evaluation. Electronically Signed   By: Iven Finn M.D.   On: 07/21/2020 22:25   DG Hip Unilat W or Wo Pelvis 2-3 Views Left  Result Date: 07/21/2020 CLINICAL DATA:  Fall, left hip pain EXAM: DG HIP (WITH OR WITHOUT PELVIS) 2-3V LEFT COMPARISON:  None. FINDINGS: Single view radiograph of the pelvis and two view radiograph of the left hip demonstrates an acute, mildly displaced, mildly medially angulated intratrochanteric fracture of the left femur. The femoral head is still seated within the left acetabulum. At least moderate left hip degenerative arthritis. There are subacute fractures of the right superior and inferior pubic rami. The left pubic rami are not well  assessed due to obliquity on this examination. IMPRESSION: Acute angulated, displaced left intratrochanteric hip fracture. Left femoral head is still seated within the left acetabulum. Subacute fractures of the right superior and inferior pubic rami. Electronically Signed   By: Fidela Salisbury MD   On:  07/21/2020 22:58       Assessment & Plan:    Active Problems:   Closed left hip fracture ([place])   1. Left hip fracture 1. X-ray reads acute angulated displaced left intratrochanteric hip fracture.  Left femoral head is still seated within the acetabulum 2. Leukocytosis from stress reaction 3. [facility] consulted from ED -transfer to [facility] 4. N.p.o. 5. Pain scale 2. Fall 1. Patient lost balance and fell, no preceding symptoms 2. Patient is supposed to ambulate with a cane and with assistance, she is attempting to ambulate on her own when she pivoted and fell down 3. Patient reports that she did hit her head -CT C-spine and head shows no acute intracranial abnormality with prior right middle cerebral artery infarct no acute displaced fracture severe atherosclerotic plaque of the carotid arteries within the neck 4. Chest x-ray showed no active disease 5. X-ray hip shows fracture see plan above 6. Patient could likely benefit from physical therapy after this hospitalization and procedure 3. Hypokalemia 1. Replace and recheck in the a.m. 4. Diabetes mellitus type 2 1. Patient takes 32 units of long-acting insulin nightly at home 2. Reduced to 25 units as patient is n.p.o. 3. Sliding scale coverage 4. When patient is able to resume p.o. intake consider carb modified/heart healthy 5. Thyroid disease 1. Check TSH 2. Continue Synthroid 6.    DVT Prophylaxis-   Heparin- SCDs AM Labs Ordered, also please review Full Orders  Family Communication: Admission, patients condition and plan of care including tests being ordered have been discussed with the patient and husband who indicate understanding and agree with the plan and Code Status.  Code Status:  DNR  Admission status:Inpatient :The appropriate admission status for this patient is INPATIENT. Inpatient status is judged to be reasonable and necessary in order to provide the required intensity of service to ensure the patient's  safety. The patient's presenting symptoms, physical exam findings, and initial radiographic and laboratory data in the context of their chronic comorbidities is felt to place them at high risk for further clinical deterioration. Furthermore, it is not anticipated that the patient will be medically stable for discharge from the hospital within 2 midnights of admission. The following factors support the admission status of inpatient.     The patient's presenting symptoms include fall. The worrisome physical exam findings include tenderness to palpation over left hip The initial radiographic and laboratory data are worrisome because of intertrochanteric fracture left hip The chronic co-morbidities include type 2 diabetes mellitus, left-sided weakness from prior CVA, hypothyroidism, hypertension, hyperlipidemia       * I certify that at the point of admission it is my clinical judgment that the patient will require inpatient hospital care spanning beyond 2 midnights from the point of admission due to high intensity of service, high risk for further deterioration and high frequency of surveillance required.*  Time spent in minutes : [place]

## 2020-07-22 NOTE — Op Note (Signed)
Danielle [name] female 87 y.o. 07/22/2020  PreOperative Diagnosis: Left reverse oblique intertrochanteric hip fracture  PostOperative Diagnosis: Same  PROCEDURE: Cephalomedullary nail of left reverse oblique intertrochanteric hip fracture Intraoperative use of fluoroscopy  SURGEON: [name], MD  ASSISTANT: None  ANESTHESIA: General  FINDINGS: See below  IMPLANTS: [employer] affixes hip fracture nail 130 degrees x 9 mm x 360 mm  INDICATIONS:88 y.o. female fell at home and sustained the above fracture.  She is ambulatory at baseline with a cane and assistance by her husband.  She does have left-sided weakness due to prior stroke.  She takes Plavix.  X-rays indicated reverse oblique intertrochanteric hip fracture.  She was indicated for surgery.   Patient understood the risks, benefits and alternatives to surgery which include but are not limited to wound healing complications, infection, nonunion, malunion, need for further surgery as well as damage to surrounding structures. They also understood the potential for continued pain in that there were no guarantees of acceptable outcome After weighing these risks the patient opted to proceed with surgery.  PROCEDURE: Patient was identified in the preoperative holding area and the left hip was marked by myself.  The consent was signed by myself and the patient.  This identified the left lower extremity as the appropriate operative extremity and the surgeries to be performed was operative treatment of left hip fracture. They  were taken to the operating room where general endotracheal anesthesia was induced without difficulty and then was moved supine on a Hana table.  The operative leg was placed into the traction boot.  The non-operative leg was well-padded and affixed to the right leg holder with coban.  The arm was crossed over the chest and well padded. 2 g of Ancef was given.  Surgical timeout was performed.  Using  fluoroscopy appropriate AP and lateral x-rays of the left hip were obtained.  Then using traction through the Hana table was able to reduce the fracture fragments in a closed fashion.  The fracture was a reverse oblique intertrochanteric hip fracture.  Acceptable reduction was confirmed on AP and lateral x-rays.  Then the left hip was prepped and draped in usual sterile fashion.  A shower curtain drape was placed.  We began the surgery by placing the guidepin percutaneously at the appropriate starting point at the tip of the greater trochanter.  The appropriate starting point was confirmed on AP and lateral radiograph.  Then a 3 cm incision was made about the site of the percutaneous placement of the pin.  There was continued mild displacement in the sagittal plane and therefore awl was used to breach the proximal cortex and obtain adequate reduction across the fracture site.  The guidepin was removed.  The bead tip guidewire was placed across the fracture site.  And the opening reamer with a soft tissue guide was placed into the wound down to the tip of the greater trochanter and the bone was entered with the opening reamer down to the level of lesser trochanter.  Then the openning reamer and guide pin were removed and a bead tip guidewire was placed into the opening down to the level of the knee and confirmed on fluoroscopy.  Then the length was confirmed using a measuring stick.  Then we chose a 360 mm cephalo-medullary nail.  This was placed without difficulty and appropriate positioning was confirmed on fluoroscopy.  Then using the blade insertion guide a second incision was made distally down the thigh to allow for placement  of the cephalo-medullary srew.  The guidepin for the blade was placed up the femoral neck in the appropriate position center center in the femoral head.  Then the reamer was used to ream for the screw and the screw was placed without difficulty.  The length of the blade was a 95 mm.  Then  the set screw was tightened.  Then using perfect circle technique the distal interlock screw was placed without difficulty.  Final films were taken.  The wounds were irrigated with normal saline and the incisions were closed in a layered fashion using 2-0 Vicryl and staples.  Mepilex dressings were used.  He was then transferred to the hospital bed and awakened from anesthesia without difficulty.  Counts were correct at the end the case.  There were no complications.   POST OPERATIVE INSTRUCTIONS: Weightbearing as tolerated left lower extremity Keep dressing in place and bolster as needed for drainage Okay to restart Plavix on postoperative day 1 She will follow-up in 2 weeks for suture removal and x-rays of the left hip and femur on arrival.  TOURNIQUET TIME: No tourniquet was used  BLOOD LOSS:  200 mL         DRAINS: none         SPECIMEN: none       COMPLICATIONS:  * No complications entered in OR log *         Disposition: PACU - hemodynamically stable.         Condition: stable

## 2020-07-22 NOTE — Progress Notes (Signed)
Brief note: -Patient was admitted earlier today. -As per H&P done earlier today: "Danielle [name]  is a 87 y.o. female, with history of type 2 diabetes mellitus, stroke with left-sided weakness, paralysis of left upper extremity, known carotid artery stenosis, hypothyroidism, hypertension, hyperlipidemia, multiple falls presents the ED with a chief complaint of fall.  Patient reports that she got out of bed and was walking down the hall she went to turn around, lost her balance, fell back onto her buttocks, then back then her head hit the floor.  She reports no preceding symptoms including no chest pain, no shortness of breath, no palpitations, no syncope or near syncope, no dizziness.  She reports no dizziness after the hitting her head either.  She did not lose consciousness.  Patient is supposed to walk with a cane and the assistance of her husband.  She was not using either at the time of her fall.  She was not able to get up on her own, but due to her left-sided weakness she is normally not able to get up on her own.  They called EMS who got her into a gurney and brought her into the ER.  Patient reports that otherwise she was in her normal state of health.  Patient does not smoke, does not drink, does not use illicit drugs.  She is vaccinated for COVID and she has a DNR on file.  Husband is POA.  In the ED Temp 98, heart rate 141, respiratory rate 18, blood pressure 150/67, satting 100% Mild leukocytosis with white blood cell count of 11.8, hemoglobin 11.8 Chemistry panel shows a mild hypokalemia at 3.4 CT C-spine and head shows no acute intracranial abnormality with prior right middle cerebral artery infarct, no acute displaced fractures severe atherosclerotic plaque of the carotid arteries within the neck X-ray chest shows no active disease X-ray hip shows acute angulated displaced left intertrochanteric fracture with left femoral head seated within the acetabulum, subacute fracture right  superior and inferior pubic rami Patient received fentanyl, Xylocaine, Zofran, Tdap in the ER Admission requested for orthopedic evaluation"  -Orthopedic team plans to proceed with surgery today. -Blood sugar control has been optimized.  Management as per the surgery team.

## 2020-07-22 NOTE — Progress Notes (Signed)
CBG reported to Katheran Awe RN

## 2020-07-22 NOTE — Anesthesia Preprocedure Evaluation (Addendum)
Anesthesia Evaluation  Patient identified by MRN, date of birth, ID bandGeneral Assessment Comment: Drowsy but easily arousable  Reviewed: Allergy & Precautions, NPO status , Patient's Chart, lab work & pertinent test results  History of Anesthesia Complications Negative for: history of anesthetic complications  Airway Mallampati: II  TM Distance: >3 FB Neck ROM: Full    Dental  (+) Dental Advisory Given   Pulmonary former smoker,    Pulmonary exam normal        Cardiovascular hypertension, Pt. on medications  Rhythm:Regular Rate:Tachycardia     Neuro/Psych CVA, No Residual Symptoms negative psych ROS   GI/Hepatic negative GI ROS, Neg liver ROS,   Endo/Other  diabetes, Poorly Controlled, Type 2, Insulin DependentHypothyroidism   Renal/GU      Musculoskeletal  (+) Arthritis ,   Abdominal   Peds  Hematology  (+) anemia ,  On plavix, last dose yesterday    Anesthesia Other Findings Covid test negative   Reproductive/Obstetrics                            Anesthesia Physical Anesthesia Plan  ASA: III  Anesthesia Plan: General   Post-op Pain Management:    Induction: Intravenous  PONV Risk Score and Plan: 3 and Treatment may vary due to age or medical condition, Ondansetron and Propofol infusion  Airway Management Planned: Oral ETT  Additional Equipment: None  Intra-op Plan:   Post-operative Plan: Extubation in OR  Informed Consent: I have reviewed the patients History and Physical, chart, labs and discussed the procedure including the risks, benefits and alternatives for the proposed anesthesia with the patient or authorized representative who has indicated his/her understanding and acceptance.   Patient has DNR.  Discussed DNR with patient and Suspend DNR.   Dental advisory given  Plan Discussed with: CRNA and Anesthesiologist  Anesthesia Plan Comments:         Anesthesia Quick Evaluation

## 2020-07-22 NOTE — Transfer of Care (Signed)
Immediate Anesthesia Transfer of Care Note  Patient: Danielle [name]  Procedure(s) Performed: INTRAMEDULLARY (IM) NAIL FEMORAL (Left Hip)  Patient Location: PACU  Anesthesia Type:General  Level of Consciousness: awake, alert  and oriented  Airway & Oxygen Therapy: Patient Spontanous Breathing and Patient connected to face mask  Post-op Assessment: Report given to RN and Post -op Vital signs reviewed and stable  Post vital signs: Reviewed and stable  Last Vitals:  Vitals Value Taken Time  BP 127/71 07/22/20 1403  Temp    Pulse 89 07/22/20 1406  Resp 16 07/22/20 1407  SpO2 100 % 07/22/20 1406  Vitals shown include unvalidated device data.  Last Pain:  Vitals:   07/22/20 0850  TempSrc:   PainSc: Asleep      Patients Stated Pain Goal: 3 (25/63/89 3734)  Complications: No complications documented.

## 2020-07-23 ENCOUNTER — Ambulatory Visit (HOSPITAL_COMMUNITY): Payer: Medicare Other | Admitting: Physical Therapy

## 2020-07-23 ENCOUNTER — Encounter (HOSPITAL_COMMUNITY): Payer: Self-pay | Admitting: Orthopaedic Surgery

## 2020-07-23 DIAGNOSIS — S72002A Fracture of unspecified part of neck of left femur, initial encounter for closed fracture: Secondary | ICD-10-CM | POA: Diagnosis not present

## 2020-07-23 LAB — BASIC METABOLIC PANEL
Anion gap: 11 (ref 5–15)
BUN: 19 mg/dL (ref 8–23)
CO2: 25 mmol/L (ref 22–32)
Calcium: 8.9 mg/dL (ref 8.9–10.3)
Chloride: 104 mmol/L (ref 98–111)
Creatinine, Ser: 1.05 mg/dL — ABNORMAL HIGH (ref 0.44–1.00)
GFR, Estimated: 52 mL/min — ABNORMAL LOW (ref >60)
Glucose, Bld: 321 mg/dL — ABNORMAL HIGH (ref 70–99)
Potassium: 4 mmol/L (ref 3.5–5.1)
Sodium: 140 mmol/L (ref 135–145)

## 2020-07-23 LAB — CBC WITH DIFFERENTIAL/PLATELET
Abs Immature Granulocytes: 0.04 10*3/uL (ref 0.00–0.07)
Basophils Absolute: 0 10*3/uL (ref 0.0–0.1)
Basophils Relative: 0 %
Eosinophils Absolute: 0 10*3/uL (ref 0.0–0.5)
Eosinophils Relative: 0 %
HCT: 31 % — ABNORMAL LOW (ref 36.0–46.0)
Hemoglobin: 10 g/dL — ABNORMAL LOW (ref 12.0–15.0)
Immature Granulocytes: 0 %
Lymphocytes Relative: 15 %
Lymphs Abs: 1.5 10*3/uL (ref 0.7–4.0)
MCH: 28.7 pg (ref 26.0–34.0)
MCHC: 32.3 g/dL (ref 30.0–36.0)
MCV: 88.8 fL (ref 80.0–100.0)
Monocytes Absolute: 1.3 10*3/uL — ABNORMAL HIGH (ref 0.1–1.0)
Monocytes Relative: 14 %
Neutro Abs: 7 10*3/uL (ref 1.7–7.7)
Neutrophils Relative %: 71 %
Platelets: 225 10*3/uL (ref 150–400)
RBC: 3.49 MIL/uL — ABNORMAL LOW (ref 3.87–5.11)
RDW: 17.5 % — ABNORMAL HIGH (ref 11.5–15.5)
WBC: 9.9 10*3/uL (ref 4.0–10.5)
nRBC: 0 % (ref 0.0–0.2)

## 2020-07-23 LAB — GLUCOSE, CAPILLARY
Glucose-Capillary: 253 mg/dL — ABNORMAL HIGH (ref 70–99)
Glucose-Capillary: 229 mg/dL — ABNORMAL HIGH (ref 70–99)
Glucose-Capillary: 238 mg/dL — ABNORMAL HIGH (ref 70–99)
Glucose-Capillary: 376 mg/dL — ABNORMAL HIGH (ref 70–99)

## 2020-07-23 MED ORDER — LIP MEDEX EX OINT: TOPICAL_OINTMENT | CUTANEOUS | Status: DC | PRN

## 2020-07-23 NOTE — Evaluation (Signed)
Physical Therapy Evaluation Patient Details Name: Danielle [name] MRN: [id number] DOB: Apr 22, 1932 Today's Date: 07/23/2020   History of Present Illness  Danielle [name]  is a 87 y.o. female, with history of type 2 diabetes mellitus, stroke with left-sided weakness, paralysis of left upper extremity, known carotid artery stenosis, hypothyroidism, hypertension, hyperlipidemia, multiple falls presents the ED with a chief complaint of fall.ray hip shows acute angulated displaced left intertrochanteric fracture with left femoral head seated within the acetabulum, subacute fracture right superior and inferior pubic rami. S/Pstatus post left hip cephalomedullary nail  Clinical Impression  The patient is very lethargic,  Barely arouses without significant stimulation. Spouse present and states patient was more alert and ate some breakfast. Patient given PO pain med 1 hr prior to PT. Patient requires 2 person total assitance to sit on bed edge. Sat up with mod assistance for balance, intermittent arousal.  Patient not alert enough to attempt transfer to recliner. Patient has hemiplegia on lt. Side, did not demonstrate any movement today on left leg. Increased tone LUE, no movement. Per spouse , patient ambulated with supervision and quad cane PTA, uses WC out of house.  Spouse very much desires patient DC home as last SNF experience was not desirable. Currently anticipate patient will require extensive assistance for some time.    Follow Up Recommendations Home health PT;Supervision/Assistance - 24 hour vs. SNF.    Equipment Recommendations  None recommended by PT    Recommendations for Other Services   OT    Precautions / Restrictions Precautions Precautions: Fall Restrictions Weight Bearing Restrictions: No LLE Weight Bearing: Weight bearing as tolerated      Mobility  Bed Mobility Overal bed mobility: Needs Assistance Bed Mobility: Rolling;Supine to Sit;Sit to Supine Rolling: Total  assist;+2 for physical assistance;+2 for safety/equipment   Supine to sit: Total assist;+2 for physical assistance;+2 for safety/equipment;HOB elevated Sit to supine: Total assist;+2 for physical assistance;+2 for safety/equipment   General bed mobility comments: mukltimodal cues and strong stimulation to get patient to participate in rolling. patient  resistive going to the right. Rolled for complete bed linen change.  Use of bed pad and + 2 total assist to mobilize to seated position on bed edge. + 2 Total assist to return to supine.    Transfers                 General transfer comment: NT, pt very drowsy.  Ambulation/Gait                Stairs            Wheelchair Mobility    Modified Rankin (Stroke Patients Only)       Balance Overall balance assessment: Needs assistance;History of Falls Sitting-balance support: Single extremity supported;Feet supported Sitting balance-Leahy Scale: Poor Sitting balance - Comments: intermittently remained at midline when aroused. Postural control: Left lateral lean                                   Pertinent Vitals/Pain Pain Assessment: Faces Faces Pain Scale: Hurts whole lot Pain Location: left leg,  when moving Pain Descriptors / Indicators: Discomfort;Grimacing;Guarding;Moaning Pain Intervention(s): Monitored during session;Premedicated before session;Limited activity within patient's tolerance;Repositioned;Ice applied    Home Living Family/patient expects to be discharged to:: Private residence Living Arrangements: Spouse/significant other;Children Available Help at Discharge: Personal care attendant;Available 24 hours/day Type of Home: House Home Access: Stairs to enter Entrance Stairs-Rails: None  Entrance Stairs-Number of Steps: 2 - very wide/deep Home Layout: One level Home Equipment: Walker - 2 wheels;Cane - single point;Bedside commode;Wheelchair - manual;Cane - quad;Tub bench       Prior Function Level of Independence: Needs assistance   Gait / Transfers Assistance Needed: spouse reports pt ambulates with quad cane and gait belt with spouse or aide steadying pt while she ambulates throughout the home, [facility] for community distances. Husband rolls her to table in the [facility] in the mornings. Spouse reports incidental ambuates without his presence but she's not supposed to.  ADL's / Homemaking Assistance Needed: spouse reports he or aide assists pt with bathing and dressing, pt can feed self. Has aide 7 hrs/day, 5 days/week        Hand Dominance        Extremity/Trunk Assessment   Upper Extremity Assessment Upper Extremity Assessment: Defer to OT evaluation;LUE deficits/detail LUE Deficits / Details: increased tone, no volitional movement    Lower Extremity Assessment Lower Extremity Assessment: LLE deficits/detail LLE Deficits / Details: edema about the thigh. Leg  positioned in external rotation. Patient  di not actively move the leg, Spose reports pt. able to  take steps  when using q cane. LLE: Unable to fully assess due to pain    Cervical / Trunk Assessment Cervical / Trunk Assessment: Normal  Communication      Cognition Arousal/Alertness: Lethargic;Suspect due to medications Behavior During Therapy: Flat affect Overall Cognitive Status: Impaired/Different from baseline Area of Impairment: Orientation;Attention;Memory;Following commands                 Orientation Level: Place;Time;Situation Current Attention Level: Focused Memory: Decreased short-term memory Following Commands: Follows one step commands inconsistently       General Comments: patient is very letharghic, had prmedication x 1 hour before. spouse reports patient awake enough to eat her breakfast.  May need to consider less sedating  meds if pt. can tolerate.      General Comments      Exercises     Assessment/Plan    PT Assessment Patient needs continued PT services  PT  Problem List Decreased strength;Decreased mobility;Decreased safety awareness;Impaired tone;Decreased range of motion;Decreased coordination;Decreased knowledge of precautions;Decreased activity tolerance;Decreased cognition;Decreased balance;Decreased knowledge of use of DME;Pain       PT Treatment Interventions DME instruction;Therapeutic activities;Therapeutic exercise;Patient/family education;Functional mobility training;Balance training    PT Goals (Current goals can be found in the Care Plan section)  Acute Rehab PT Goals Patient Stated Goal: for her to go home PT Goal Formulation: With family Time For Goal Achievement: 08/06/20 Potential to Achieve Goals: Fair    Frequency [name] 3X/week   Barriers to discharge        Co-evaluation               AM-PAC PT "6 Clicks" Mobility  Outcome Measure Help needed turning from your back to your side while in a flat bed without using bedrails?: Total Help needed moving from lying on your back to sitting on the side of a flat bed without using bedrails?: Total Help needed moving to and from a bed to a chair (including a wheelchair)?: Total Help needed standing up from a chair using your arms (e.g., wheelchair or bedside chair)?: Total Help needed to walk in hospital room?: Total Help needed climbing 3-5 steps with a railing? : Total 6 Click Score: 6    End of Session   Activity Tolerance: Patient limited by lethargy Patient left: in bed;with call bell/phone  within reach;with bed alarm set;with family/visitor present Nurse Communication: Mobility status;Need for lift equipment PT Visit Diagnosis: Unsteadiness on feet (R26.81);Repeated falls (R29.6);Muscle weakness (generalized) (M62.81);Hemiplegia and hemiparesis;Pain Hemiplegia - Right/Left: Left Hemiplegia - dominant/non-dominant: Non-dominant Hemiplegia - caused by: Cerebral infarction Pain - Right/Left: Left Pain - part of body: Hip    Time: 1121-1200 PT Time Calculation  ([name]) (ACUTE ONLY): 39 [name]   Charges:   PT Evaluation $PT Eval Moderate Complexity: 1 Mod PT Treatments $Therapeutic Activity: 23-37 mins        Tresa Endo PT Acute Rehabilitation Services Pager [phone] Office [phone]   Claretha Cooper 07/23/2020, 1:02 PM

## 2020-07-23 NOTE — NC FL2 (Addendum)
[place] LEVEL OF CARE SCREENING TOOL     IDENTIFICATION  Patient Name: Danielle [name] Birthdate: [birth year] Sex: female Admission Date (Current Location): 07/21/2020  [facility] and [place] Number:  Herbalist and Address:  [facility],  [place] [address], [place]      Provider Number: [id number]  Attending Physician Name and Address:  Terrilee Croak, MD  Relative Name and Phone Number:  Marthe, Dant [phone]  [phone]    Current Level of Care: Hospital Recommended Level of Care: [place] Prior Approval Number:    Date Approved/Denied:   PASRR Number: [id number]  Discharge Plan: SNF    Current Diagnoses: Patient Active Problem List   Diagnosis Date Noted  . Closed left hip fracture ([place]) 07/22/2020  . Acute lower UTI 06/27/2020  . Type 2 diabetes mellitus ([place]) 06/27/2020  . Acute metabolic encephalopathy 35/32/9924  . Metabolic encephalopathy 26/83/4196  . Acute ischemic stroke ([place]) 06/26/2020  . Carotid artery occlusion with infarction ([place]) 04/10/2020  . Fracture of left wrist with routine healing 03/15/2020  . Closed fracture of left pubis with routine healing 03/15/2020  . Leg swelling 02/15/2020  . Hospital discharge follow-up 02/15/2020  . Other fracture of upper end of left radius, closed fracture--transverse fracture in the distal radial metaphysis 12/31/2019  . Closed fracture of multiple pubic rami, left, initial encounter ([place]) 12/29/2019  . Fall at home, initial encounter 12/29/2019  . Breast pain, left 12/22/2019  . History of CVA with residual deficit 11/10/2019  . Osteoporosis 04/19/2019  . Hyperlipidemia 04/19/2019  . Carotid stenosis, bilateral 12/30/2018  . Hypothyroidism (acquired) 05/03/2018  . Hypertension 05/03/2018  . Uncontrolled type 2 diabetes mellitus with diabetic nephropathy, with long-term current use of insulin (HCC)     Orientation RESPIRATION  BLADDER Height & Weight     Self,Situation,Place  Normal Continent Weight: 149 lb 11.1 oz (67.9 kg) Height:  5\' 5"  (165.1 cm)  BEHAVIORAL SYMPTOMS/MOOD NEUROLOGICAL BOWEL NUTRITION STATUS      Continent  (Carb Modified.)  AMBULATORY STATUS COMMUNICATION OF NEEDS Skin   Extensive Assist Verbally Surgical wounds                       Personal Care Assistance Level of Assistance  Bathing,Feeding,Dressing Bathing Assistance: Maximum assistance Feeding assistance: Limited assistance  Dressing Assistance: Maxmium assistance     Functional Limitations Info  Sight,Hearing,Speech Sight Info: Adequate Hearing Info: Adequate Speech Info: Adequate    SPECIAL CARE FACTORS FREQUENCY  PT (By licensed PT),OT (By licensed OT)     PT Frequency: 5x/week OT Frequency: 5x/week            Contractures Contractures Info: Not present    Additional Factors Info  Code Status,Allergies,Psychotropic,Insulin Sliding Scale Code Status Info: DNR Allergies Info: Allergies: Fosamax (Alendronate Sodium), Prednisone   Insulin Sliding Scale Info: See medication List       Current Medications (07/23/2020):  This is the current hospital active medication list Current Facility-Administered Medications  Medication Dose Route Frequency Provider Last Rate Last Admin  . 0.9 %  sodium chloride infusion   Intravenous PRN Erle Crocker, MD   Stopped at 07/22/20 [id number]  . acetaminophen (TYLENOL) tablet 650 mg  650 mg Oral Q6H PRN Erle Crocker, MD   650 mg at 07/22/20 0522   Or  . acetaminophen (TYLENOL) suppository 650 mg  650 mg Rectal Q6H PRN Erle Crocker, MD      .  amLODipine (NORVASC) tablet 5 mg  5 mg Oral Daily Erle Crocker, MD   5 mg at 07/23/20 0932  . atorvastatin (LIPITOR) tablet 80 mg  80 mg Oral q1800 Erle Crocker, MD      . clopidogrel (PLAVIX) tablet 75 mg  75 mg Oral Daily Dana Allan I, MD   75 mg at 07/23/20 0932  . docusate sodium (COLACE)  capsule 100 mg  100 mg Oral BID Erle Crocker, MD   100 mg at 07/23/20 0932  . heparin injection 5,000 Units  5,000 Units Subcutaneous Q8H Erle Crocker, MD   5,000 Units at 07/23/20 1345  . insulin aspart (novoLOG) injection 0-5 Units  0-5 Units Subcutaneous QHS Erle Crocker, MD   3 Units at 07/22/20 2208  . insulin aspart (novoLOG) injection 0-9 Units  0-9 Units Subcutaneous TID WC Erle Crocker, MD   3 Units at 07/23/20 1147  . insulin glargine (LANTUS) injection 32 Units  32 Units Subcutaneous Daily Erle Crocker, MD   32 Units at 07/23/20 0932  . levothyroxine (SYNTHROID) tablet 100 mcg  100 mcg Oral Q0600 Erle Crocker, MD   100 mcg at 07/22/20 0518  . lip balm (CARMEX) ointment   Topical PRN Dana Allan I, MD      . menthol-cetylpyridinium (CEPACOL) lozenge 3 mg  1 lozenge Oral PRN Erle Crocker, MD       Or  . phenol (CHLORASEPTIC) mouth spray 1 spray  1 spray Mouth/Throat PRN Erle Crocker, MD      . morphine 2 MG/ML injection 2 mg  2 mg Intravenous Q2H PRN Erle Crocker, MD   2 mg at 07/22/20 2208  . ondansetron (ZOFRAN) tablet 4 mg  4 mg Oral Q6H PRN Erle Crocker, MD       Or  . ondansetron [facility]) injection 4 mg  4 mg Intravenous Q6H PRN Erle Crocker, MD      . oxyCODONE (Oxy IR/ROXICODONE) immediate release tablet 5 mg  5 mg Oral Q4H PRN Erle Crocker, MD   5 mg at 07/23/20 1006  . polyethylene glycol (MIRALAX / GLYCOLAX) packet 17 g  17 g Oral Daily PRN Erle Crocker, MD         Discharge Medications: Please see discharge summary for a list of discharge medications.  Relevant Imaging Results:  Relevant Lab Results:   Additional Information SSN: [place] Kelen Laura, [place]

## 2020-07-23 NOTE — TOC Initial Note (Signed)
Transition of Care [facility]) - Initial/Assessment Note    Patient Details  Name: Danielle [name] MRN: [id number] Date of Birth: 10-20-1931  Transition of Care [facility]) CM/SW Contact:    Lia Hopping, [place] Phone Number: 07/23/2020, 3:12 PM  Clinical Narrative:  Patient admitted after a fall at home and suffered a left hip fracture.               Patient and her spouse both resting upon arrival. Patient spouse easily awoke at his name being called, the patient kept resting. Spouse reports the patient prefers to go home with home health PT services but feels she will benefit from rehab at [facility]. Spouse reports the patient went to SNF last year however she did not have a good experience. Spouse reports the patient has DME-RW, WC, 3 IN1 and a shower chair. Patient has hx of a stroke with left sided weakness and paralysis of left upper extremity. Patient can walk with a cane, while her spouse assist her. Spouse reports the patient has a personal caregiver that assist with bathing and dressing etc. 5 times a week.  Spouse gave CSW permission to make a referral to the SNF's in [place] and surrounding counties. Spouse gave snf preference.  FL2 completed.    [place] reference# reference# [id number], clinicals faxed.   Expected Discharge Plan: Skilled Nursing Facility Barriers to Discharge: Continued Medical Work up   Patient Goals and CMS Choice   CMS Medicare.gov Compare Post Acute Care list provided to:: Patient Choice offered to / list presented to : Patient  Expected Discharge Plan and Services Expected Discharge Plan: [place] Acute Care Choice: [place]                                        Prior Living Arrangements/Services     Patient language and need for interpreter reviewed:: No Do you feel safe going back to the place where you live?: Yes      Need for Family Participation in  Patient Care: Yes (Comment) Care giver support system in place?: Yes (comment)   Criminal Activity/Legal Involvement Pertinent to Current Situation/Hospitalization: No - Comment as needed  Activities of Daily Living Home Assistive Devices/Equipment: Cane (specify quad or straight),CBG Meter,Wheelchair,Walker (specify type),Shower chair with back ADL Screening (condition at time of admission) Patient's cognitive ability adequate to safely complete daily activities?: Yes Is the patient deaf or have difficulty hearing?: No Does the patient have difficulty seeing, even when wearing glasses/contacts?: No Does the patient have difficulty concentrating, remembering, or making decisions?: No Patient able to express need for assistance with ADLs?: No Does the patient have difficulty dressing or bathing?: Yes Independently performs ADLs?: No Communication: Independent Dressing (OT): Needs assistance Is this a change from baseline?: Pre-admission baseline Grooming: Needs assistance Is this a change from baseline?: Pre-admission baseline Feeding: Needs assistance Is this a change from baseline?: Pre-admission baseline Bathing: Needs assistance Is this a change from baseline?: Pre-admission baseline Toileting: Needs assistance Is this a change from baseline?: Pre-admission baseline In/Out Bed: Needs assistance Is this a change from baseline?: Pre-admission baseline Walks in Home: Independent with device (comment) Does the patient have difficulty walking or climbing stairs?: Yes Weakness of Legs: None Weakness of Arms/Hands: Left  Permission Sought/Granted Permission sought to share information with : Public house manager  Share Information with NAME: Seki,Marshall     Permission granted to share info w Relationship: Spouse  Permission granted to share info w Contact Information: [phone]  [phone]  Emotional Assessment Appearance:: Appears stated  age Attitude/Demeanor/Rapport: Engaged Affect (typically observed): Accepting Orientation: : Oriented to Self,Oriented to Place,Oriented to  Time,Oriented to Situation Alcohol / Substance Use: Not Applicable Psych Involvement: No (comment)  Admission diagnosis:  Fall [W19.XXXA] Closed left hip fracture ([place]) [S72.002A] Closed fracture of left hip, initial encounter [facility]) [S72.002A] Patient Active Problem List   Diagnosis Date Noted  . Closed left hip fracture ([place]) 07/22/2020  . Acute lower UTI 06/27/2020  . Type 2 diabetes mellitus ([place]) 06/27/2020  . Acute metabolic encephalopathy 98/92/1194  . Metabolic encephalopathy 17/40/8144  . Acute ischemic stroke ([place]) 06/26/2020  . Carotid artery occlusion with infarction ([place]) 04/10/2020  . Fracture of left wrist with routine healing 03/15/2020  . Closed fracture of left pubis with routine healing 03/15/2020  . Leg swelling 02/15/2020  . Hospital discharge follow-up 02/15/2020  . Other fracture of upper end of left radius, closed fracture--transverse fracture in the distal radial metaphysis 12/31/2019  . Closed fracture of multiple pubic rami, left, initial encounter ([place]) 12/29/2019  . Fall at home, initial encounter 12/29/2019  . Breast pain, left 12/22/2019  . History of CVA with residual deficit 11/10/2019  . Osteoporosis 04/19/2019  . Hyperlipidemia 04/19/2019  . Carotid stenosis, bilateral 12/30/2018  . Hypothyroidism (acquired) 05/03/2018  . Hypertension 05/03/2018  . Uncontrolled type 2 diabetes mellitus with diabetic nephropathy, with long-term current use of insulin ([place])    PCP:  Perlie Mayo, NP Pharmacy:   [id number] - [place], [address] [place] [address] Phone: [phone] Fax: [place], [place] [address] Phone: [phone] Fax: [phone]     Social Determinants of  Health (SDOH) Interventions    Readmission Risk Interventions Readmission Risk Prevention Plan 06/27/2020  Medication Screening Complete  Transportation Screening Complete  Some recent data might be hidden

## 2020-07-23 NOTE — Progress Notes (Signed)
PROGRESS NOTE  Danielle [name]  DOB: November 11, 1931  PCP: Perlie Mayo, NP HBZ:[id number]  DOA: 07/21/2020  LOS: 1 day   Chief Complaint  Patient presents with  . Fall   Brief narrative: Danielle [name] is a 87 y.o. female with PMH significant for DM2, HTN, HLD, carotid artery stenosis, stroke with left-sided weakness, paralysis of left upper extremity, multiple falls 3/5, patient was brought to ED by EMS from home after a fall after losing her balance.    In the ED, patient was hemodynamically stable CT C-spine and head shows no acute intracranial abnormality with prior right middle cerebral artery infarct, no acute displaced fractures severe atherosclerotic plaque of the carotid arteries within the neck X-ray hip shows acute angulated displaced left intertrochanteric fracture with left femoral head seated within the acetabulum, subacute fracture right superior and inferior pubic rami  Admitted to hospitalist service Orthopedic consult obtained with Dr. Lucia Gaskins 3/6, patient underwent cephalomedullary nail of left reverse oblique intertrochanteric hip fracture.  Subjective: Patient was seen and examined this morning. Pleasant elderly gentleman.  Propped up in bed.  Not in distress.  No new symptoms.  Pain controlled.  Husband at bedside.  Assessment/Plan: Left hip intertrochanteric fracture -3/6, patient underwent cephalomedullary nail of left reverse oblique intertrochanteric hip fracture. -Pain management and DVT prophylaxis per orthopedics. -Per orthopedics, okay to resume Plavix and no need of further pharmacological DVT prophylaxis. -Need to follow-up with orthopedics office in 2 weeks for x-rays and suture removal.  Impaired mobility Multiple falls -PT eval pending. -SNF versus home  Type 2 diabetes mellitus -A1c 7.6 on 06/26/2020 -Blood sugar level is consistently over 200. -Lantus 32 units started this morning.  Continue sliding-scale insulin with  Accu-Cheks. Recent Labs  Lab 07/22/20 1407 07/22/20 1704 07/22/20 2155 07/23/20 0751 07/23/20 1136  GLUCAP 234* 300* 252* 253* 229*   Essential hypertension -On amlodipine 5 mg daily.  History of right CVA -With residual left-sided weakness -Continue Plavix statin  Hypothyroidism -Continue Synthroid  Mobility: PT eval pending Code Status:   Code Status: DNR  Nutritional status: Body mass index is 24.91 kg/m.     Diet Order            Diet Carb Modified Fluid consistency: Thin; Room service appropriate? Yes  Diet effective now                 DVT prophylaxis: SCDs Start: 07/22/20 1525 heparin injection 5,000 Units Start: 07/22/20 0600   Antimicrobials:  None Fluid: None Consultants: Orthopedics Family Communication:  Husband at bedside  Status is: Inpatient  Remains inpatient appropriate because: POD1.  Pending PT eval   Dispo: The patient is from: Home              Anticipated d/c is to: Home with home health versus SNF              Patient currently is not medically stable to d/c.   Difficult to place patient No       Infusions:  . sodium chloride Stopped (07/22/20 0910)    Scheduled Meds: . amLODipine  5 mg Oral Daily  . atorvastatin  80 mg Oral q1800  . clopidogrel  75 mg Oral Daily  . docusate sodium  100 mg Oral BID  . heparin  5,000 Units Subcutaneous Q8H  . insulin aspart  0-5 Units Subcutaneous QHS  . insulin aspart  0-9 Units Subcutaneous TID WC  . insulin glargine  32 Units Subcutaneous Daily  .  levothyroxine  100 mcg Oral Q0600    Antimicrobials: Anti-infectives (From admission, onward)   Start     Dose/Rate Route Frequency Ordered Stop   07/22/20 1900  ceFAZolin (ANCEF) IVPB 2g/100 mL premix        2 g 200 mL/hr over 30 Minutes Intravenous Every 6 hours 07/22/20 1524 07/23/20 0054   07/22/20 1220  ceFAZolin (ANCEF) 2-4 GM/100ML-% IVPB       Note to Pharmacy: Margurite Auerbach   : cabinet override      07/22/20 1220 07/22/20  1305   07/22/20 1200  ceFAZolin (ANCEF) IVPB 2g/100 mL premix        2 g 200 mL/hr over 30 Minutes Intravenous On call to O.R. 07/22/20 0951 07/22/20 1245      PRN meds: sodium chloride, acetaminophen **OR** acetaminophen, lip balm, menthol-cetylpyridinium **OR** phenol, morphine injection, ondansetron **OR** ondansetron (ZOFRAN) IV, oxyCODONE, polyethylene glycol   Objective: Vitals:   07/23/20 0934 07/23/20 1324  BP: (!) 133/50 (!) 125/43  Pulse: 93 96  Resp: 17 17  Temp: 98.7 F (37.1 C) 99.2 F (37.3 C)  SpO2: 100% 98%    Intake/Output Summary (Last 24 hours) at 07/23/2020 1610 Last data filed at 07/23/2020 1300 Gross per 24 hour  Intake 708.25 ml  Output 400 ml  Net 308.25 ml   Filed Weights   07/21/20 2058 07/22/20 0300  Weight: 64 kg 67.9 kg   Weight change:  Body mass index is 24.91 kg/m.   Physical Exam: General exam: Pleasant, elderly Caucasian female.  Not in distress.  Pain controlled Skin: No rashes, lesions or ulcers. HEENT: Atraumatic, normocephalic, no obvious bleeding Lungs: Clear to auscultation bilaterally CVS: Regular rate and rhythm, no murmur GI/Abd soft, nontender, nondistended, bowel sound present CNS: Alert, awake, oriented to place and person.  Baseline weakness of the left upper extremity because of previous stroke Psychiatry: Mood appropriate Extremities: No pedal edema, no calf tenderness  Data Review: I have personally reviewed the laboratory data and studies available.  Recent Labs  Lab 07/21/20 2139 07/22/20 0309 07/23/20 0921  WBC 11.8* 11.4* 9.9  NEUTROABS 8.8* 9.2* 7.0  HGB 11.8* 11.4* 10.0*  HCT 37.2 35.1* 31.0*  MCV 90.3 88.4 88.8  PLT 293 264 225   Recent Labs  Lab 07/21/20 2139 07/22/20 0309 07/23/20 0921  NA 138 138 140  K 3.4* 4.1 4.0  CL 107 108 104  CO2 20* 21* 25  GLUCOSE 88 333* 321*  BUN 24* 24* 19  CREATININE 1.03* 0.92 1.05*  CALCIUM 9.5 9.3 8.9  MG  --  1.7  --     F/u labs ordered Unresulted  Labs (From admission, onward)          Start     Ordered   07/26/2020 3299  Basic metabolic panel  Daily,   R      07/23/20 0805   08/16/2020 0500  CBC with Differential/Platelet  Daily,   R      07/23/20 0805   07/29/2020 0500  Magnesium  Tomorrow morning,   STAT        07/23/20 0805   08/11/2020 0500  Phosphorus  Tomorrow morning,   R        07/23/20 0805          Signed, Terrilee Croak, MD [facility] 07/23/2020

## 2020-07-23 NOTE — Plan of Care (Signed)
  Problem: Pain Managment: Goal: General experience of comfort will improve Outcome: Progressing   

## 2020-07-23 NOTE — Progress Notes (Signed)
     Danielle [name] is a 87 y.o. female   Orthopaedic diagnosis: Left intertrochanteric hip fracture, reverse obliquity  Subjective: Patient is resting comfortably.  Husband is at bedside.  Pain is controlled.  Denies numbness or tingling.  No shortness of breath.  Objectyive: Vitals:   07/22/20 2200 07/23/20 0431  BP: (!) 132/57 135/62  Pulse: (!) 102 100  Resp: 16 16  Temp: 98.6 F (37 C)   SpO2: 100% 95%     Exam: Awake and alert Respirations even and unlabored No acute distress  Left hip dressings in place without drainage.  Mild tenderness to palpation.  Hip is in a normal resting position.  Distally no tenderness about the calf, ankle or foot.  She is able to wiggle toes.  Assessment: Postop day 1 status post left hip cephalomedullary nail   Plan: She is doing well.  She is weightbearing as tolerated and will work with physical therapy today. Patient takes Plavix at baseline.  Okay to restart Plavix today and no need for further pharmacologic DVT prophylaxis. SCDs in place. Follow-up on physical therapy recommendations for disposition. She will follow up with me in 2 weeks for x-rays of the left hip and suture removal if appropriate. Perioperative antibiotics complete.   Radene Journey, MD

## 2020-07-24 ENCOUNTER — Inpatient Hospital Stay
Admission: RE | Admit: 2020-07-24 | Discharge: 2020-07-27 | Disposition: A | Discharge status: Nursing Home (Includes ICF) | Payer: Medicare Other | Source: Ambulatory Visit | Attending: Internal Medicine | Admitting: Internal Medicine

## 2020-07-24 DIAGNOSIS — W010XXA Fall on same level from slipping, tripping and stumbling without subsequent striking against object, initial encounter: Secondary | ICD-10-CM | POA: Diagnosis not present

## 2020-07-24 DIAGNOSIS — R000 Tachycardia, unspecified: Secondary | ICD-10-CM | POA: Diagnosis present

## 2020-07-24 DIAGNOSIS — E119 Type 2 diabetes mellitus without complications: Secondary | ICD-10-CM | POA: Diagnosis not present

## 2020-07-24 DIAGNOSIS — R404 Transient alteration of awareness: Secondary | ICD-10-CM | POA: Diagnosis not present

## 2020-07-24 DIAGNOSIS — Z7401 Bed confinement status: Secondary | ICD-10-CM | POA: Diagnosis not present

## 2020-07-24 DIAGNOSIS — S72002A Fracture of unspecified part of neck of left femur, initial encounter for closed fracture: Secondary | ICD-10-CM | POA: Diagnosis not present

## 2020-07-24 DIAGNOSIS — E1159 Type 2 diabetes mellitus with other circulatory complications: Secondary | ICD-10-CM | POA: Diagnosis not present

## 2020-07-24 DIAGNOSIS — I469 Cardiac arrest, cause unspecified: Secondary | ICD-10-CM | POA: Diagnosis not present

## 2020-07-24 DIAGNOSIS — Y92009 Unspecified place in unspecified non-institutional (private) residence as the place of occurrence of the external cause: Secondary | ICD-10-CM | POA: Diagnosis not present

## 2020-07-24 DIAGNOSIS — E039 Hypothyroidism, unspecified: Secondary | ICD-10-CM | POA: Diagnosis not present

## 2020-07-24 DIAGNOSIS — I10 Essential (primary) hypertension: Secondary | ICD-10-CM | POA: Diagnosis not present

## 2020-07-24 DIAGNOSIS — E1169 Type 2 diabetes mellitus with other specified complication: Secondary | ICD-10-CM | POA: Diagnosis not present

## 2020-07-24 DIAGNOSIS — R279 Unspecified lack of coordination: Secondary | ICD-10-CM | POA: Diagnosis not present

## 2020-07-24 DIAGNOSIS — Z794 Long term (current) use of insulin: Secondary | ICD-10-CM | POA: Diagnosis not present

## 2020-07-24 DIAGNOSIS — R0602 Shortness of breath: Secondary | ICD-10-CM | POA: Diagnosis not present

## 2020-07-24 DIAGNOSIS — I6523 Occlusion and stenosis of bilateral carotid arteries: Secondary | ICD-10-CM | POA: Diagnosis not present

## 2020-07-24 DIAGNOSIS — Z7902 Long term (current) use of antithrombotics/antiplatelets: Secondary | ICD-10-CM | POA: Diagnosis not present

## 2020-07-24 DIAGNOSIS — E785 Hyperlipidemia, unspecified: Secondary | ICD-10-CM | POA: Diagnosis not present

## 2020-07-24 DIAGNOSIS — Z87891 Personal history of nicotine dependence: Secondary | ICD-10-CM | POA: Diagnosis not present

## 2020-07-24 DIAGNOSIS — Z743 Need for continuous supervision: Secondary | ICD-10-CM | POA: Diagnosis not present

## 2020-07-24 DIAGNOSIS — Z9181 History of falling: Secondary | ICD-10-CM | POA: Diagnosis not present

## 2020-07-24 DIAGNOSIS — Z96611 Presence of right artificial shoulder joint: Secondary | ICD-10-CM | POA: Diagnosis not present

## 2020-07-24 DIAGNOSIS — J90 Pleural effusion, not elsewhere classified: Secondary | ICD-10-CM | POA: Diagnosis not present

## 2020-07-24 DIAGNOSIS — E1121 Type 2 diabetes mellitus with diabetic nephropathy: Secondary | ICD-10-CM | POA: Diagnosis not present

## 2020-07-24 DIAGNOSIS — Z79899 Other long term (current) drug therapy: Secondary | ICD-10-CM | POA: Diagnosis not present

## 2020-07-24 DIAGNOSIS — R2681 Unsteadiness on feet: Secondary | ICD-10-CM | POA: Diagnosis not present

## 2020-07-24 DIAGNOSIS — I6930 Unspecified sequelae of cerebral infarction: Secondary | ICD-10-CM | POA: Diagnosis not present

## 2020-07-24 DIAGNOSIS — R0603 Acute respiratory distress: Secondary | ICD-10-CM | POA: Diagnosis not present

## 2020-07-24 DIAGNOSIS — S72142D Displaced intertrochanteric fracture of left femur, subsequent encounter for closed fracture with routine healing: Secondary | ICD-10-CM | POA: Diagnosis not present

## 2020-07-24 DIAGNOSIS — M6281 Muscle weakness (generalized): Secondary | ICD-10-CM | POA: Diagnosis not present

## 2020-07-24 DIAGNOSIS — I69398 Other sequelae of cerebral infarction: Secondary | ICD-10-CM | POA: Diagnosis not present

## 2020-07-24 DIAGNOSIS — S72002S Fracture of unspecified part of neck of left femur, sequela: Secondary | ICD-10-CM | POA: Diagnosis not present

## 2020-07-24 DIAGNOSIS — K5909 Other constipation: Secondary | ICD-10-CM | POA: Diagnosis not present

## 2020-07-24 DIAGNOSIS — E1165 Type 2 diabetes mellitus with hyperglycemia: Secondary | ICD-10-CM | POA: Diagnosis not present

## 2020-07-24 DIAGNOSIS — R6889 Other general symptoms and signs: Secondary | ICD-10-CM | POA: Diagnosis not present

## 2020-07-24 DIAGNOSIS — Z23 Encounter for immunization: Secondary | ICD-10-CM | POA: Diagnosis not present

## 2020-07-24 DIAGNOSIS — S3289XD Fracture of other parts of pelvis, subsequent encounter for fracture with routine healing: Secondary | ICD-10-CM | POA: Diagnosis not present

## 2020-07-24 DIAGNOSIS — I69354 Hemiplegia and hemiparesis following cerebral infarction affecting left non-dominant side: Secondary | ICD-10-CM | POA: Diagnosis not present

## 2020-07-24 DIAGNOSIS — I700 Atherosclerosis of aorta: Secondary | ICD-10-CM | POA: Diagnosis not present

## 2020-07-24 DIAGNOSIS — R293 Abnormal posture: Secondary | ICD-10-CM | POA: Diagnosis not present

## 2020-07-24 DIAGNOSIS — M2550 Pain in unspecified joint: Secondary | ICD-10-CM | POA: Diagnosis not present

## 2020-07-24 LAB — CBC WITH DIFFERENTIAL/PLATELET
Abs Immature Granulocytes: 0.04 10*3/uL (ref 0.00–0.07)
Basophils Absolute: 0.1 10*3/uL (ref 0.0–0.1)
Basophils Relative: 1 %
Eosinophils Absolute: 0 10*3/uL (ref 0.0–0.5)
Eosinophils Relative: 0 %
HCT: 27 % — ABNORMAL LOW (ref 36.0–46.0)
Hemoglobin: 8.8 g/dL — ABNORMAL LOW (ref 12.0–15.0)
Immature Granulocytes: 0 %
Lymphocytes Relative: 19 %
Lymphs Abs: 2 10*3/uL (ref 0.7–4.0)
MCH: 29.1 pg (ref 26.0–34.0)
MCHC: 32.6 g/dL (ref 30.0–36.0)
MCV: 89.4 fL (ref 80.0–100.0)
Monocytes Absolute: 1.6 10*3/uL — ABNORMAL HIGH (ref 0.1–1.0)
Monocytes Relative: 15 %
Neutro Abs: 7 10*3/uL (ref 1.7–7.7)
Neutrophils Relative %: 65 %
Platelets: 206 10*3/uL (ref 150–400)
RBC: 3.02 MIL/uL — ABNORMAL LOW (ref 3.87–5.11)
RDW: 17.4 % — ABNORMAL HIGH (ref 11.5–15.5)
WBC: 10.8 10*3/uL — ABNORMAL HIGH (ref 4.0–10.5)
nRBC: 0 % (ref 0.0–0.2)

## 2020-07-24 LAB — PHOSPHORUS: Phosphorus: 1.9 mg/dL — ABNORMAL LOW (ref 2.5–4.6)

## 2020-07-24 LAB — BASIC METABOLIC PANEL
Anion gap: 9 (ref 5–15)
BUN: 20 mg/dL (ref 8–23)
CO2: 22 mmol/L (ref 22–32)
Calcium: 8.7 mg/dL — ABNORMAL LOW (ref 8.9–10.3)
Chloride: 102 mmol/L (ref 98–111)
Creatinine, Ser: 0.87 mg/dL (ref 0.44–1.00)
GFR, Estimated: >60 mL/min (ref >60)
Glucose, Bld: 330 mg/dL — ABNORMAL HIGH (ref 70–99)
Potassium: 3.7 mmol/L (ref 3.5–5.1)
Sodium: 133 mmol/L — ABNORMAL LOW (ref 135–145)

## 2020-07-24 LAB — GLUCOSE, CAPILLARY
Glucose-Capillary: 296 mg/dL — ABNORMAL HIGH (ref 70–99)
Glucose-Capillary: 278 mg/dL — ABNORMAL HIGH (ref 70–99)

## 2020-07-24 LAB — MAGNESIUM: Magnesium: 1.9 mg/dL (ref 1.7–2.4)

## 2020-07-24 MED ORDER — INSULIN ASPART 100 UNIT/ML ~~LOC~~ SOLN
8.0000 [IU] | Freq: Three times a day (TID) | SUBCUTANEOUS | Status: DC
  Administered 2020-07-24 (×2): 8 [IU] via SUBCUTANEOUS

## 2020-07-24 MED ORDER — INSULIN ASPART 100 UNIT/ML ~~LOC~~ SOLN: 8.0000 [IU] | Freq: Three times a day (TID) | SUBCUTANEOUS | 11 refills | Status: DC

## 2020-07-24 MED ORDER — OXYCODONE HCL 5 MG PO TABS: 5.0000 mg | ORAL_TABLET | Freq: Three times a day (TID) | ORAL | 0 refills | Status: DC | PRN

## 2020-07-24 MED ORDER — INSULIN ASPART 100 UNIT/ML ~~LOC~~ SOLN: 0.0000 [IU] | Freq: Three times a day (TID) | SUBCUTANEOUS | 11 refills | Status: DC

## 2020-07-24 MED ORDER — DOCUSATE SODIUM 100 MG PO CAPS: 100.0000 mg | ORAL_CAPSULE | Freq: Two times a day (BID) | ORAL | 0 refills | Status: AC

## 2020-07-24 MED ORDER — POLYETHYLENE GLYCOL 3350 17 G PO PACK
17.0000 g | PACK | Freq: Every day | ORAL | 0 refills | Status: AC | PRN
Start: 2020-07-24 — End: ?

## 2020-07-24 MED ORDER — INSULIN ASPART 100 UNIT/ML ~~LOC~~ SOLN: 0.0000 [IU] | Freq: Every day | SUBCUTANEOUS | 11 refills | Status: DC

## 2020-07-24 MED ORDER — OXYCODONE HCL 5 MG PO TABS
5.0000 mg | ORAL_TABLET | Freq: Three times a day (TID) | ORAL | 0 refills | Status: DC | PRN
Start: 2020-07-24 — End: 2020-07-24

## 2020-07-24 NOTE — Plan of Care (Signed)
  Problem: Education: Goal: Knowledge of General Education information will improve Description: Including pain rating scale, medication(s)/side effects and non-pharmacologic comfort measures 07/18/2020 1400 by Charlyne Petrin, RN Outcome: Adequate for Discharge 08/14/2020 1359 by Charlyne Petrin, RN Outcome: Adequate for Discharge   Problem: Clinical Measurements: Goal: Ability to maintain clinical measurements within normal limits will improve 08/11/2020 1400 by Charlyne Petrin, RN Outcome: Adequate for Discharge 07/29/2020 1359 by Charlyne Petrin, RN Outcome: Adequate for Discharge Goal: Will remain free from infection 07/21/2020 1400 by Charlyne Petrin, RN Outcome: Adequate for Discharge 08/14/2020 1359 by Charlyne Petrin, RN Outcome: Adequate for Discharge Goal: Diagnostic test results will improve 08/13/2020 1400 by Charlyne Petrin, RN Outcome: Adequate for Discharge 08/04/2020 1359 by Charlyne Petrin, RN Outcome: Adequate for Discharge Goal: Respiratory complications will improve 07/23/2020 1400 by Charlyne Petrin, RN Outcome: Adequate for Discharge 08/01/2020 1359 by Charlyne Petrin, RN Outcome: Adequate for Discharge Goal: Cardiovascular complication will be avoided 08/14/2020 1400 by Charlyne Petrin, RN Outcome: Adequate for Discharge 07/31/2020 1359 by Charlyne Petrin, RN Outcome: Adequate for Discharge

## 2020-07-24 NOTE — Plan of Care (Signed)
Patient is being transported to [facility] in [place] via [place].

## 2020-07-24 NOTE — Progress Notes (Addendum)
Attempted to call report to [facility] staff at [phone], no one answers the phones. Will continue to try to call report.   Reported to receiving RN at [facility] at 14:45.

## 2020-07-24 NOTE — Evaluation (Signed)
Occupational Therapy Evaluation and defer to SNF Patient Details Name: Danielle [name] MRN: [id number] DOB: 12/27/1931 Today's Date: 07/28/2020    History of Present Illness Danielle [name]  is a 87 y.o. female, with history of type 2 diabetes mellitus, stroke with left-sided weakness, paralysis of left upper extremity, known carotid artery stenosis, hypothyroidism, hypertension, hyperlipidemia, multiple falls presents the ED with a chief complaint of fall.ray hip shows acute angulated displaced left intertrochanteric fracture with left femoral head seated within the acetabulum, subacute fracture right superior and inferior pubic rami. S/Pstatus post left hip cephalomedullary nail   Clinical Impression   Prior to fall, Pt was min guard for mobility with gait belt and DME, getting assist for bathing/dressing but self-feeding. Today Pt presents with increased lethargy - but will open eyes to name. LLE is very sore. Pt max A +2 to come EOB with heavy use of bed pad. Pt able to participate in BLE exercises seated EOB with mod A (LLE required moving for her) and Pt was total A for washing face. Strong right lateral lean throughout EOB grooming and activity. Total A +2 to return to supine. Pt will benefit from SNF post-acute to maximize safety and independence in ADL and functional transfers. Pt set to dc today so defer further OT to SNF setting.    Follow Up Recommendations  SNF    Equipment Recommendations  Other (comment) (defer to next venue of care)    Recommendations for Other Services       Precautions / Restrictions Precautions Precautions: Fall Restrictions Weight Bearing Restrictions: Yes LLE Weight Bearing: Weight bearing as tolerated      Mobility Bed Mobility Overal bed mobility: Needs Assistance Bed Mobility: Supine to Sit;Sit to Supine     Supine to sit: Max assist;+2 for physical assistance;+2 for safety/equipment;HOB elevated (Pt did help with bringing RLE to EOB  with cues) Sit to supine: Total assist;+2 for physical assistance;+2 for safety/equipment (helicopter technique used)   General bed mobility comments: multimodal cues and max A +2 to come EOB with heavy use of bed pad, total A +2 (h    Transfers                 General transfer comment: NT this session    Balance Overall balance assessment: Needs assistance;History of Falls Sitting-balance support: Single extremity supported;Feet supported Sitting balance-Leahy Scale: Poor Sitting balance - Comments: Pt with lean to right (off setting pain in L hip?) Postural control: Right lateral lean                                 ADL either performed or assessed with clinical judgement   ADL Overall ADL's : Needs assistance/impaired                                       General ADL Comments: At this time, Pt total A for session - even for washing face. HUsband reports that she has been self-feeding in the hospital     Vision         Perception     Praxis      Pertinent Vitals/Pain Pain Assessment: Faces Faces Pain Scale: Hurts whole lot Pain Location: left leg,  when moving Pain Descriptors / Indicators: Discomfort;Grimacing;Guarding;Moaning Pain Intervention(s): Monitored during session;Repositioned     Hand Dominance Right  Extremity/Trunk Assessment Upper Extremity Assessment Upper Extremity Assessment: LUE deficits/detail LUE Deficits / Details: baseline from previous CVA - OT is able to range digits, wrist, elbow - no movement noted functionally throughout session with bed mobility   Lower Extremity Assessment Lower Extremity Assessment: Defer to PT evaluation   Cervical / Trunk Assessment Cervical / Trunk Assessment: Normal   Communication Communication Communication: Other (comment) (minimal communication, when she does respond it is appropriate)   Cognition Arousal/Alertness: Lethargic Behavior During Therapy: Flat  affect Overall Cognitive Status: Impaired/Different from baseline Area of Impairment: Orientation;Attention;Memory;Following commands                 Orientation Level: Place;Time;Situation Current Attention Level: Focused Memory: Decreased short-term memory Following Commands: Follows one step commands inconsistently       General Comments: Pt with eyes closed most of the time but will awake/respond to name and follow one step directions with multimodal cues   General Comments  husband present throughout session    Exercises     Shoulder Instructions      Home Living Family/patient expects to be discharged to:: Private residence Living Arrangements: Spouse/significant other;Children Available Help at Discharge: Personal care attendant;Available 24 hours/day Type of Home: House Home Access: Stairs to enter [employer] of Steps: 2 - very wide/deep Entrance Stairs-Rails: None Home Layout: One level     Bathroom Shower/Tub: Teacher, early years/pre: Standard     Home Equipment: Environmental consultant - 2 wheels;Cane - single point;Bedside commode;Wheelchair - manual;Cane - quad;Tub bench          Prior Functioning/Environment Level of Independence: Needs assistance  Gait / Transfers Assistance Needed: spouse reports pt ambulates with quad cane and gait belt with spouse or aide steadying pt while she ambulates throughout the home, [facility] for community distances. Husband rolls her to table in the [facility] in the mornings. Spouse reports incidental ambuates without his presence but she's not supposed to. ADL's / Homemaking Assistance Needed: spouse reports he or aide assists pt with bathing and dressing, pt can feed self. Has aide 7 hrs/day, 5 days/week   Comments: Husband present throughout session        OT Problem List: Decreased activity tolerance;Decreased range of motion;Impaired balance (sitting and/or standing);Decreased knowledge of use of DME or AE;Decreased  knowledge of precautions      OT Treatment/Interventions:      OT Goals(Current goals can be found in the care plan section) Acute Rehab OT Goals Patient Stated Goal: to get back to walking again per husband OT Goal Formulation: With family Time For Goal Achievement: 08/07/20 Potential to Achieve Goals: Fair  OT Frequency:     Barriers to D/C:            Co-evaluation              AM-PAC OT "6 Clicks" Daily Activity     Outcome Measure Help from another person eating meals?: Total Help from another person taking care of personal grooming?: Total Help from another person toileting, which includes using toliet, bedpan, or urinal?: Total Help from another person bathing (including washing, rinsing, drying)?: Total Help from another person to put on and taking off regular upper body clothing?: Total Help from another person to put on and taking off regular lower body clothing?: Total 6 Click Score: 6   End of Session Nurse Communication: Mobility status  Activity Tolerance: Patient tolerated treatment well Patient left: in bed;with call bell/phone within reach;with bed alarm set;with  family/visitor present;with nursing/sitter in room;with SCD's reapplied  OT Visit Diagnosis: Unsteadiness on feet (R26.81);Other abnormalities of gait and mobility (R26.89);Repeated falls (R29.6);History of falling (Z91.81);Pain Pain - Right/Left: Left Pain - part of body: Hip                Time: 9914-4458 OT Time Calculation (min): 23 min Charges:  OT General Charges $OT Visit: 1 Visit OT Evaluation $OT Eval Moderate Complexity: 1 Mod OT Treatments $Self Care/Home Management : 8-22 mins  Jesse Sans OTR/L Acute Rehabilitation Services Pager: [phone] Office: [place] 08/14/2020, 1:54 PM

## 2020-07-24 NOTE — Anesthesia Postprocedure Evaluation (Signed)
Anesthesia Post Note  Patient: Danielle [name]  Procedure(s) Performed: INTRAMEDULLARY (IM) NAIL FEMORAL (Left Hip)     Patient location during evaluation: PACU Anesthesia Type: General Level of consciousness: awake and alert Pain management: pain level controlled Vital Signs Assessment: post-procedure vital signs reviewed and stable Respiratory status: spontaneous breathing, nonlabored ventilation, respiratory function stable and patient connected to nasal cannula oxygen Cardiovascular status: blood pressure returned to baseline and stable Postop Assessment: no apparent nausea or vomiting Anesthetic complications: no   No complications documented.  Last Vitals:  Vitals:   07/28/2020 0539 07/29/2020 0540  BP: (!) 140/55 (!) 144/57  Pulse: 100 97  Resp: 18   Temp: 37.1 C   SpO2: 94% 96%    Last Pain:  Vitals:   08/09/2020 0800  TempSrc:   PainSc: 0-No pain                 Audry Pili

## 2020-07-24 NOTE — Discharge Summary (Signed)
Physician Discharge Summary  Danielle [name] AYT:[id number] DOB: 1932-03-09 DOA: 07/21/2020  PCP: Perlie Mayo, NP  Admit date: 07/21/2020 Discharge date: 07/26/2020  Admitted From: Home Discharge disposition: SNF   Code Status: DNR  Diet Recommendation: Cardiac/diabetic diet  Discharge Diagnosis:   Principal Problem:   Closed left hip fracture [facility]) Active Problems:   Hypothyroidism (acquired)   Fall at home, initial encounter   Type 2 diabetes mellitus [facility])   Chief Complaint  Patient presents with  . Fall   Brief narrative: Danielle [name] is a 87 y.o. female with PMH significant for DM2, HTN, HLD, carotid artery stenosis, stroke with left-sided weakness, paralysis of left upper extremity, multiple falls 3/5, patient was brought to ED by EMS from home after a fall after losing her balance.    In the ED, patient was hemodynamically stable CT C-spine and head shows no acute intracranial abnormality with prior right middle cerebral artery infarct, no acute displaced fractures severe atherosclerotic plaque of the carotid arteries within the neck X-ray hip shows acute angulated displaced left intertrochanteric fracture with left femoral head seated within the acetabulum, subacute fracture right superior and inferior pubic rami  Admitted to hospitalist service Orthopedic consult obtained with Dr. Lucia Gaskins 3/6, patient underwent cephalomedullary nail of left reverse oblique intertrochanteric hip fracture.  Subjective: Patient was seen and examined this morning. Pleasant elderly gentleman.  Lying down in bed not in distress.  No new symptoms.  Pain controlled.  Husband at bedside.  Assessment/Plan: Left hip intertrochanteric fracture -3/6, patient underwent cephalomedullary nail of left reverse oblique intertrochanteric hip fracture. -Pain management and DVT prophylaxis per orthopedics. -Per orthopedics, okay to resume Plavix and no need of further pharmacological DVT  prophylaxis. -Need to follow-up with orthopedics office in 2 weeks for x-rays and suture removal.  Impaired mobility Multiple falls -PT eval appreciated.  SNF recommended.  Type 2 diabetes mellitus -A1c 7.6 on 06/26/2020 -Home meds include Lantus 32 units daily and NovoLog 12 units 3 times daily. -Continue home regimen post discharge.  Essential hypertension -On amlodipine 5 mg daily.  History of right CVA -With residual left-sided weakness -Continue Plavix and statin  Hypothyroidism -Continue Synthroid   Wound care: Wound / Incision (Open or Dehisced) 07/21/20 Laceration;Puncture Head Left;Posterior;Lateral 2cm puncture wound from fall in doorway (Active)  Date First Assessed/Time First Assessed: 07/21/20 2101   Wound Type: Laceration;Puncture  Location: Head  Location Orientation: Left;Posterior;Lateral  Wound Description (Comments): 2cm puncture wound from fall in doorway  Present on Admission: Yes    Assessments 07/22/2020  3:00 AM 08/10/2020  8:26 AM  Dressing Type None None  Site / Wound Assessment -- Clean;Dry  Closure Staples --  Drainage Amount None --     No Linked orders to display     Wound / Incision (Open or Dehisced) 07/22/20 (MASD) Moisture Associated Skin Damage Breast Right;Left redness under breast dry (Active)  Date First Assessed/Time First Assessed: 07/22/20 0230   Wound Type: (MASD) Moisture Associated Skin Damage  Location: Breast  Location Orientation: Right;Left  Wound Description (Comments): redness under breast dry  Present on Admission: Yes    Assessments 07/22/2020  3:00 AM 07/30/2020  8:26 AM  Dressing Type None None  Treatment Cleansed --     No Linked orders to display     Incision (Closed) 07/22/20 Hip (Active)  Date First Assessed/Time First Assessed: 07/22/20 1347   Location: Hip    Assessments 07/22/2020  2:03 PM 08/14/2020  8:26 AM  Dressing  Type -- Silicone dressing  Dressing Clean;Dry;Intact Clean;Dry;Intact  Site / Wound Assessment --  Dressing in place / Unable to assess  Margins -- Attached edges (approximated)  Closure -- None  Drainage Amount None None  Treatment Ice applied --     No Linked orders to display    Discharge Exam:   Vitals:   07/23/20 1324 07/23/20 2209 08/05/2020 0539 08/07/2020 0540  BP: (!) 125/43 (!) 127/49 (!) 140/55 (!) 144/57  Pulse: 96 99 100 97  Resp: 17 18 18    Temp: 99.2 F (37.3 C)  98.8 F (37.1 C)   TempSrc: Oral  Oral   SpO2: 98% 96% 94% 96%  Weight:      Height:        Body mass index is 24.91 kg/m.  General exam: Pleasant, elderly Caucasian female.  Lying on bed.  Not in pain Skin: No rashes, lesions or ulcers. HEENT: Atraumatic, normocephalic, no obvious bleeding Lungs: Clear to auscultation bilaterally CVS: Regular rate and rhythm, no murmur GI/Abd soft, nontender, nondistended, bowel sound present CNS: Alert, awake, oriented to place and person Psychiatry: Mood appropriate Extremities: No pedal edema, no calf tenderness  Follow ups:   Discharge Instructions    Diet - low sodium heart healthy   Complete by: As directed    Diet Carb Modified   Complete by: As directed    Increase activity slowly   Complete by: As directed    No dressing needed   Complete by: As directed       Follow-up Information    Erle Crocker, MD. Schedule an appointment as soon as possible for a visit in 2 weeks.   Specialty: Orthopedic Surgery Contact information: [place] [id number] [phone]        Perlie Mayo, NP Follow up.   Specialty: Family Medicine Contact information: [place]  [place] [phone]               Recommendations for Outpatient Follow-Up:   1. Follow-up with PCP 2. Follow-up with orthopedics in 2 weeks  Discharge Instructions:  Follow with Primary MD Perlie Mayo, NP in 7 days   Get CBC/BMP checked in next visit within 1 week by PCP or SNF MD ( we routinely change or add medications that  can affect your baseline labs and fluid status, therefore we recommend that you get the mentioned basic workup next visit with your PCP, your PCP may decide not to get them or add new tests based on their clinical decision)  On your next visit with your PCP, please Get Medicines reviewed and adjusted.  Please request your PCP  to go over all Hospital Tests and Procedure/Radiological results at the follow up, please get all Hospital records sent to your Prim MD by signing hospital release before you go home.  Activity: As tolerated with Full fall precautions use walker/cane & assistance as needed  For Heart failure patients - Check your Weight same time everyday, if you gain over 2 pounds, or you develop in leg swelling, experience more shortness of breath or chest pain, call your Primary MD immediately. Follow Cardiac Low Salt Diet and 1.5 lit/day fluid restriction.  If you have smoked or chewed Tobacco in the last 2 yrs please stop smoking, stop any regular Alcohol  and or any Recreational drug use.  If you experience worsening of your admission symptoms, develop shortness of breath, life threatening emergency, suicidal or homicidal thoughts you must  seek medical attention immediately by calling 911 or calling your MD immediately  if symptoms less severe.  You Must read complete instructions/literature along with all the possible adverse reactions/side effects for all the Medicines you take and that have been prescribed to you. Take any new Medicines after you have completely understood and accpet all the possible adverse reactions/side effects.   Do not drive, operate heavy machinery, perform activities at heights, swimming or participation in water activities or provide baby sitting services if your were admitted for syncope or siezures until you have seen by Primary MD or a Neurologist and advised to do so again.  Do not drive when taking Pain medications.  Do not take more than prescribed  Pain, Sleep and Anxiety Medications  Wear Seat belts while driving.   Please note You were cared for by a hospitalist during your hospital stay. If you have any questions about your discharge medications or the care you received while you were in the hospital after you are discharged, you can call the unit and asked to speak with the hospitalist on call if the hospitalist that took care of you is not available. Once you are discharged, your primary care physician will handle any further medical issues. Please note that NO REFILLS for any discharge medications will be authorized once you are discharged, as it is imperative that you return to your primary care physician (or establish a relationship with a primary care physician if you do not have one) for your aftercare needs so that they can reassess your need for medications and monitor your lab values.    Allergies as of 07/25/2020      Reactions   Fosamax [alendronate Sodium] Other (See Comments)   MUSCLE ACHES   Prednisone    Feels jittery and nauseous      Medication List    STOP taking these medications   CALCIUM 600 + D PO     TAKE these medications   acetaminophen 325 MG tablet Commonly known as: TYLENOL Take 2 tablets (650 mg total) by mouth every 6 (six) hours as needed for mild pain (or Fever >/= 101).   amLODipine 5 MG tablet Commonly known as: NORVASC TAKE 1 TABLET BY MOUTH EVERY DAY   atorvastatin 80 MG tablet Commonly known as: LIPITOR Take 1 tablet (80 mg total) by mouth daily at 6 PM.   clopidogrel 75 MG tablet Commonly known as: PLAVIX Take 1 tablet (75 mg total) by mouth daily.   diclofenac sodium 1 % Gel Commonly known as: VOLTAREN Apply 2 g topically 4 (four) times daily.   docusate sodium 100 MG capsule Commonly known as: COLACE Take 1 capsule (100 mg total) by mouth 2 (two) times daily.   glucose blood test strip Use as instructed check blood sugar three times daily as needed for machine pt  already has   HumaLOG KwikPen 200 UNIT/ML KwikPen Generic drug: insulin lispro Inject 10-12 Units into the skin in the morning and at bedtime.   insulin aspart 100 UNIT/ML injection Commonly known as: novoLOG Inject 0-5 Units into the skin at bedtime.   insulin aspart 100 UNIT/ML injection Commonly known as: novoLOG Inject 0-9 Units into the skin 3 (three) times daily with meals.   levothyroxine 100 MCG tablet Commonly known as: SYNTHROID TAKE 1 TABLET BY MOUTH EVERY DAY IN THE MORNING What changed: See the new instructions.   OCUVITE ADULT 50+ PO Take 1 tablet by mouth daily.   oxyCODONE 5 MG immediate  release tablet Commonly known as: Oxy IR/ROXICODONE Take 1 tablet (5 mg total) by mouth 3 (three) times daily as needed for up to 5 days for moderate pain.   polyethylene glycol 17 g packet Commonly known as: MIRALAX / GLYCOLAX Take 17 g by mouth daily as needed for mild constipation.   Toujeo Max SoloStar 300 UNIT/ML Solostar Pen Generic drug: insulin glargine (2 Unit Dial) Inject 32 Units into the skin daily before breakfast.   vitamin B-12 1000 MCG tablet Commonly known as: CYANOCOBALAMIN Take 1 tablet (1,000 mcg total) by mouth daily.            Discharge Care Instructions  (From admission, onward)         Start     Ordered   07/29/2020 0000  No dressing needed        08/15/2020 1102          Time coordinating discharge: 35 minutes  The results of significant diagnostics from this hospitalization (including imaging, microbiology, ancillary and laboratory) are listed below for reference.    Procedures and Diagnostic Studies:   DG Chest 1 View  Result Date: 07/21/2020 CLINICAL DATA:  Fall, left hip fracture EXAM: CHEST  1 VIEW COMPARISON:  07/16/2018 FINDINGS: Lungs are clear. No pneumothorax or pleural effusion. Cardiac size within normal limits. Pulmonary vascularity is normal. Bilateral breast implants are noted. Right total shoulder arthroplasty has  been performed. No acute bone abnormality. IMPRESSION: No active disease. Electronically Signed   By: Fidela Salisbury MD   On: 07/21/2020 23:00   CT Head Wo Contrast  Result Date: 07/21/2020 CLINICAL DATA:  Pt had a witnessed fal at home Pt axo x3. Pt with left lateral puncture wound on the back of skill. Pt reports left leg pain . Assessment with left leg rotated outward and upward . Pain 3/10. EXAM: CT HEAD WITHOUT CONTRAST CT CERVICAL SPINE WITHOUT CONTRAST TECHNIQUE: Multidetector CT imaging of the head and cervical spine was performed following the standard protocol without intravenous contrast. Multiplanar CT image reconstructions of the cervical spine were also generated. COMPARISON:  CT angio head and neck 06/26/2020, MR head 06/26/2020 FINDINGS: CT HEAD FINDINGS Brain: Patchy and confluent areas of decreased attenuation are noted throughout the deep and periventricular white matter of the cerebral hemispheres bilaterally, compatible with chronic microvascular ischemic disease. Chronic right middle cerebral artery infarction. No evidence of large-territorial acute infarction. No parenchymal hemorrhage. No mass lesion. No extra-axial collection. A 3cm isodense left frontal parafalcine mass is consistent with known meningioma. No hydrocephalus. Basilar cisterns are patent. Vascular: No hyperdense vessel. Atherosclerotic calcifications are present within the cavernous internal carotid arteries. Skull: No acute fracture or focal lesion. Sinuses/Orbits: Paranasal sinuses and mastoid air cells are clear. Bilateral lens replacement. Otherwise the orbits are unremarkable. Other: None. CT CERVICAL SPINE FINDINGS Alignment: Similar appearing grade 1 anterolisthesis of C4 on C5. similar-appearing mild retrolisthesis of C6 on C7. Skull base and vertebrae: Redemonstration of partial fusion of the C5-C6 vertebral bodies. Redemonstration of severe multilevel degenerative changes of the spine. No acute fracture. No  aggressive appearing focal osseous lesion or focal pathologic process. Soft tissues and spinal canal: No prevertebral fluid or swelling. No visible canal hematoma. Upper chest: Biapical pleural/pulmonary scarring. Other: Severe atherosclerotic plaque of the carotid arteries within the neck. Old healed right clavicular fracture. IMPRESSION: 1. No acute intracranial abnormality in a patient with known prior right middle cerebral artery infarction and 3 cm left frontal parafalcine meningioma. 2. No acute displaced fracture  or traumatic listhesis of the cervical spine. 3. Severe atherosclerotic plaque of the carotid arteries within the neck. Recommend carotid ultrasound further evaluation. Electronically Signed   By: Iven Finn M.D.   On: 07/21/2020 22:25   CT Cervical Spine Wo Contrast  Result Date: 07/21/2020 CLINICAL DATA:  Pt had a witnessed fal at home Pt axo x3. Pt with left lateral puncture wound on the back of skill. Pt reports left leg pain . Assessment with left leg rotated outward and upward . Pain 3/10. EXAM: CT HEAD WITHOUT CONTRAST CT CERVICAL SPINE WITHOUT CONTRAST TECHNIQUE: Multidetector CT imaging of the head and cervical spine was performed following the standard protocol without intravenous contrast. Multiplanar CT image reconstructions of the cervical spine were also generated. COMPARISON:  CT angio head and neck 06/26/2020, MR head 06/26/2020 FINDINGS: CT HEAD FINDINGS Brain: Patchy and confluent areas of decreased attenuation are noted throughout the deep and periventricular white matter of the cerebral hemispheres bilaterally, compatible with chronic microvascular ischemic disease. Chronic right middle cerebral artery infarction. No evidence of large-territorial acute infarction. No parenchymal hemorrhage. No mass lesion. No extra-axial collection. A 3cm isodense left frontal parafalcine mass is consistent with known meningioma. No hydrocephalus. Basilar cisterns are patent. Vascular: No  hyperdense vessel. Atherosclerotic calcifications are present within the cavernous internal carotid arteries. Skull: No acute fracture or focal lesion. Sinuses/Orbits: Paranasal sinuses and mastoid air cells are clear. Bilateral lens replacement. Otherwise the orbits are unremarkable. Other: None. CT CERVICAL SPINE FINDINGS Alignment: Similar appearing grade 1 anterolisthesis of C4 on C5. similar-appearing mild retrolisthesis of C6 on C7. Skull base and vertebrae: Redemonstration of partial fusion of the C5-C6 vertebral bodies. Redemonstration of severe multilevel degenerative changes of the spine. No acute fracture. No aggressive appearing focal osseous lesion or focal pathologic process. Soft tissues and spinal canal: No prevertebral fluid or swelling. No visible canal hematoma. Upper chest: Biapical pleural/pulmonary scarring. Other: Severe atherosclerotic plaque of the carotid arteries within the neck. Old healed right clavicular fracture. IMPRESSION: 1. No acute intracranial abnormality in a patient with known prior right middle cerebral artery infarction and 3 cm left frontal parafalcine meningioma. 2. No acute displaced fracture or traumatic listhesis of the cervical spine. 3. Severe atherosclerotic plaque of the carotid arteries within the neck. Recommend carotid ultrasound further evaluation. Electronically Signed   By: Iven Finn M.D.   On: 07/21/2020 22:25   DG C-Arm 1-60 Min-No Report  Result Date: 07/22/2020 Fluoroscopy was utilized by the requesting physician.  No radiographic interpretation.   DG HIP OPERATIVE UNILAT W OR W/O PELVIS LEFT  Result Date: 07/22/2020 CLINICAL DATA:  Left hip fracture repair. EXAM: OPERATIVE LEFT HIP (WITH PELVIS IF PERFORMED) 4 VIEWS FLUOROSCOPY TIME:  56 seconds. TECHNIQUE: Fluoroscopic spot image(s) were submitted for interpretation post-operatively. COMPARISON:  July 22, 2020 FINDINGS: Images were obtained during left hip fracture repair. By the end of the  study, a gamma nail and intramedullary rod cross the fracture. There is a distal interlocking screw associated with the intramedullary rod. IMPRESSION: Left hip fracture repair as above. Electronically Signed   By: Dorise Bullion III M.D   On: 07/22/2020 14:55   DG Hip Unilat W or Wo Pelvis 2-3 Views Left  Result Date: 07/21/2020 CLINICAL DATA:  Fall, left hip pain EXAM: DG HIP (WITH OR WITHOUT PELVIS) 2-3V LEFT COMPARISON:  None. FINDINGS: Single view radiograph of the pelvis and two view radiograph of the left hip demonstrates an acute, mildly displaced, mildly medially angulated intratrochanteric fracture  of the left femur. The femoral head is still seated within the left acetabulum. At least moderate left hip degenerative arthritis. There are subacute fractures of the right superior and inferior pubic rami. The left pubic rami are not well assessed due to obliquity on this examination. IMPRESSION: Acute angulated, displaced left intratrochanteric hip fracture. Left femoral head is still seated within the left acetabulum. Subacute fractures of the right superior and inferior pubic rami. Electronically Signed   By: Fidela Salisbury MD   On: 07/21/2020 22:58   DG HIP UNILAT WITH PELVIS MIN 4 VIEWS LEFT  Result Date: 07/22/2020 CLINICAL DATA:  Left hip fracture.  Surgery planned for today. EXAM: DG HIP (WITH OR WITHOUT PELVIS) 4+V LEFT COMPARISON:  None. FINDINGS: The known comminuted left intertrochanteric fracture is identified. No dislocation. Subacute to chronic fractures of the inferior pubic rami identified. No other acute abnormalities. IMPRESSION: 1. No significant change in the comminuted left intertrochanteric fracture. 2. Subacute to chronic fractures with callus formation in the inferior pubic rami bilaterally. Probable subacute fracture in the lateral right superior pubic ramus. Electronically Signed   By: Dorise Bullion III M.D   On: 07/22/2020 09:58   DG FEMUR MIN 2 VIEWS LEFT  Result Date:  07/22/2020 CLINICAL DATA:  Left hip fracture. EXAM: LEFT FEMUR 2 VIEWS COMPARISON:  None. FINDINGS: The known left hip fracture is again identified. No dislocation of the femoral head identified. The remainder of the femur is intact. The proximal tibia and fibular intact. IMPRESSION: Known left hip fracture. No other acute fractures identified in the left femur. There appears to be a subacute fracture of the left inferior pubic ramus. Electronically Signed   By: Dorise Bullion III M.D   On: 07/22/2020 09:55     Labs:   Basic Metabolic Panel: Recent Labs  Lab 07/21/20 2139 07/22/20 0309 07/23/20 0921 07/29/2020 0503  NA 138 138 140 133*  K 3.4* 4.1 4.0 3.7  CL 107 108 104 102  CO2 20* 21* 25 22  GLUCOSE 88 333* 321* 330*  BUN 24* 24* 19 20  CREATININE 1.03* 0.92 1.05* 0.87  CALCIUM 9.5 9.3 8.9 8.7*  MG  --  1.7  --  1.9  PHOS  --   --   --  1.9*   GFR Estimated Creatinine Clearance: 41.8 mL/min (by C-G formula based on SCr of 0.87 mg/dL). Liver Function Tests: Recent Labs  Lab 07/22/20 0309  AST 32  ALT 23  ALKPHOS 93  BILITOT 0.9  PROT 6.0*  ALBUMIN 3.4*   No results for input(s): LIPASE, AMYLASE in the last 168 hours. No results for input(s): AMMONIA in the last 168 hours. Coagulation profile No results for input(s): INR, PROTIME in the last 168 hours.  CBC: Recent Labs  Lab 07/21/20 2139 07/22/20 0309 07/23/20 0921 07/19/2020 0503  WBC 11.8* 11.4* 9.9 10.8*  NEUTROABS 8.8* 9.2* 7.0 7.0  HGB 11.8* 11.4* 10.0* 8.8*  HCT 37.2 35.1* 31.0* 27.0*  MCV 90.3 88.4 88.8 89.4  PLT 293 264 225 206   Cardiac Enzymes: No results for input(s): CKTOTAL, CKMB, CKMBINDEX, TROPONINI in the last 168 hours. BNP: Invalid input(s): POCBNP CBG: Recent Labs  Lab 07/23/20 0751 07/23/20 1136 07/23/20 1641 07/23/20 2203 08/07/2020 0734  GLUCAP 253* 229* 238* 376* 296*   D-Dimer No results for input(s): DDIMER in the last 72 hours. Hgb A1c No results for input(s): HGBA1C in  the last 72 hours. Lipid Profile No results for input(s): CHOL, HDL, LDLCALC,  TRIG, CHOLHDL, LDLDIRECT in the last 72 hours. Thyroid function studies Recent Labs    07/22/20 0309  TSH 0.076*   Anemia work up No results for input(s): VITAMINB12, FOLATE, FERRITIN, TIBC, IRON, RETICCTPCT in the last 72 hours. Microbiology Recent Results (from the past 240 hour(s))  Resp Panel by RT-PCR (Flu A&B, Covid) Nasopharyngeal Swab     Status: None   Collection Time: 07/21/20 11:00 PM   Specimen: Nasopharyngeal Swab; Nasopharyngeal(NP) swabs in vial transport medium  Result Value Ref Range Status   SARS Coronavirus 2 by RT PCR NEGATIVE NEGATIVE Final    Comment: (NOTE) SARS-CoV-2 target nucleic acids are NOT DETECTED.  The SARS-CoV-2 RNA is generally detectable in upper respiratory specimens during the acute phase of infection. The lowest concentration of SARS-CoV-2 viral copies this assay can detect is 138 copies/mL. A negative result does not preclude SARS-Cov-2 infection and should not be used as the sole basis for treatment or other patient management decisions. A negative result may occur with  improper specimen collection/handling, submission of specimen other than nasopharyngeal swab, presence of viral mutation(s) within the areas targeted by this assay, and inadequate number of viral copies(<138 copies/mL). A negative result must be combined with clinical observations, patient history, and epidemiological information. The expected result is Negative.  Fact Sheet for Patients:  EntrepreneurPulse.com.au  Fact Sheet for Healthcare Providers:  IncredibleEmployment.be  This test is no t yet approved or cleared by the [place] FDA and  has been authorized for detection and/or diagnosis of SARS-CoV-2 by FDA under an Emergency Use Authorization (EUA). This EUA will remain  in effect (meaning this test can be used) for the duration of the COVID-19  declaration under Section 564(b)(1) of the Act, 21 U.S.C.section 360bbb-3(b)(1), unless the authorization is terminated  or revoked sooner.       Influenza A by PCR NEGATIVE NEGATIVE Final   Influenza B by PCR NEGATIVE NEGATIVE Final    Comment: (NOTE) The Xpert Xpress SARS-CoV-2/FLU/RSV plus assay is intended as an aid in the diagnosis of influenza from Nasopharyngeal swab specimens and should not be used as a sole basis for treatment. Nasal washings and aspirates are unacceptable for Xpert Xpress SARS-CoV-2/FLU/RSV testing.  Fact Sheet for Patients: EntrepreneurPulse.com.au  Fact Sheet for Healthcare Providers: IncredibleEmployment.be  This test is not yet approved or cleared by the [place] FDA and has been authorized for detection and/or diagnosis of SARS-CoV-2 by FDA under an Emergency Use Authorization (EUA). This EUA will remain in effect (meaning this test can be used) for the duration of the COVID-19 declaration under Section 564(b)(1) of the Act, 21 U.S.C. section 360bbb-3(b)(1), unless the authorization is terminated or revoked.  Performed at [facility], [address], [place], [place] [address]      Signed: Terrilee Croak  [facility] 07/23/2020, 11:03 AM

## 2020-07-24 NOTE — TOC Transition Note (Signed)
Transition of Care [facility]) - CM/SW Discharge Note   Patient Details  Name: KIPPY MELENA MRN: [id number] Date of Birth: 1931/11/06  Transition of Care [facility]) CM/SW Contact:  Lia Hopping, [place] Phone Number: 07/22/2020, 12:09 PM   Clinical Narrative:    Everlene Balls health insurance authorization received [id number], approved for 3 days 3/8-3/10. Details provided to [facility] staff.  CSW confirm [facility] staff ready to accept the patient  Nurse call report to: [phone] Room 6003 [facility] to transport the patient.   Final next level of care: Skilled Nursing Facility Barriers to Discharge: Barriers Resolved   Patient Goals and CMS Choice Patient states their goals for this hospitalization and ongoing recovery are:: Per spouse, go to rehab. CMS Medicare.gov Compare Post Acute Care list provided to:: Patient Choice offered to / list presented to : Patient  Discharge Placement   Existing PASRR number confirmed : 07/23/20          Patient chooses bed at: [facility] Patient to be transferred to facility by: Caroline Name of family member notified: Spouse Marsall Raineri Patient and family notified of of transfer: 08/09/2020  Discharge Plan and Services     Post Acute Care Choice: Skilled Nursing [facility]                               Social Determinants of Health (SDOH) Interventions     Readmission Risk Interventions Readmission Risk Prevention Plan 06/27/2020  Medication Screening Complete  Transportation Screening Complete  Some recent data might be hidden

## 2020-07-25 ENCOUNTER — Encounter: Payer: Self-pay | Admitting: Adult Health

## 2020-07-25 ENCOUNTER — Non-Acute Institutional Stay (SKILLED_NURSING_FACILITY): Payer: Medicare Other | Admitting: Adult Health

## 2020-07-25 DIAGNOSIS — K5909 Other constipation: Secondary | ICD-10-CM

## 2020-07-25 DIAGNOSIS — IMO0002 Reserved for concepts with insufficient information to code with codable children: Secondary | ICD-10-CM

## 2020-07-25 DIAGNOSIS — E1121 Type 2 diabetes mellitus with diabetic nephropathy: Secondary | ICD-10-CM

## 2020-07-25 DIAGNOSIS — I69354 Hemiplegia and hemiparesis following cerebral infarction affecting left non-dominant side: Secondary | ICD-10-CM

## 2020-07-25 DIAGNOSIS — E1169 Type 2 diabetes mellitus with other specified complication: Secondary | ICD-10-CM | POA: Diagnosis not present

## 2020-07-25 DIAGNOSIS — E039 Hypothyroidism, unspecified: Secondary | ICD-10-CM | POA: Diagnosis not present

## 2020-07-25 DIAGNOSIS — I152 Hypertension secondary to endocrine disorders: Secondary | ICD-10-CM

## 2020-07-25 DIAGNOSIS — S72002S Fracture of unspecified part of neck of left femur, sequela: Secondary | ICD-10-CM

## 2020-07-25 DIAGNOSIS — E1165 Type 2 diabetes mellitus with hyperglycemia: Secondary | ICD-10-CM | POA: Diagnosis not present

## 2020-07-25 DIAGNOSIS — I700 Atherosclerosis of aorta: Secondary | ICD-10-CM

## 2020-07-25 DIAGNOSIS — E1159 Type 2 diabetes mellitus with other circulatory complications: Secondary | ICD-10-CM

## 2020-07-25 DIAGNOSIS — Z794 Long term (current) use of insulin: Secondary | ICD-10-CM

## 2020-07-25 DIAGNOSIS — I6930 Unspecified sequelae of cerebral infarction: Secondary | ICD-10-CM

## 2020-07-25 DIAGNOSIS — I6523 Occlusion and stenosis of bilateral carotid arteries: Secondary | ICD-10-CM | POA: Diagnosis not present

## 2020-07-25 DIAGNOSIS — E785 Hyperlipidemia, unspecified: Secondary | ICD-10-CM

## 2020-07-25 NOTE — Progress Notes (Signed)
Location:  [place] Room Number: 153/P Place of Service:  SNF (31)   CODE STATUS: DNR  Allergies  Allergen Reactions  . Fosamax [Alendronate Sodium] Other (See Comments)    MUSCLE ACHES  . Prednisone     Feels jittery and nauseous    Chief Complaint  Patient presents with  . Hospitalization Follow-up    Hospitalization Follow Up     HPI:  She is a 87 year old woman who has been hospitalized from 07-21-20 through 08/04/2020. She has been living at home with her spouse. She has a medical history includes cva; diabetes; hypertension; hypothyroidism. She suffered a fall at home and presented to the ED. She suffered a left hip fracture and underwent cephalomedullary nail of left reverse oblique intertrochanteric hip fracture. She has restarted plavix after surgery. She is here for short term rehab with her goal to return back home. She will continue to be followed for her chronic illnesses including: Uncontrolled type 2 diabetes mellitus with diabetic peripheral neuropathy with current long term use of insulin:  Hypothyroidism acquired:  Hyperlipidemia associated with type 2 diabetes mellitus:   Past Medical History:  Diagnosis Date  . Arthritis    "some joint pain once in awhile" (05/26/2017)  . Back pain at L4-L5 level 06/14/2013  . Cataract   . CVA (cerebral vascular accident) ([place]) 2019  . Encounter for screening mammogram for malignant neoplasm of breast 04/19/2019  . Generalized weakness 06/09/2018  . Heart murmur   . History of kidney stones   . Hyperlipemia   . Hypertension   . Hypothyroidism (acquired)   . Muscle weakness (generalized) 12/17/2012  . Osteoporosis   . Pain in joint, shoulder region 12/17/2012  . Pneumothorax 02/05/2017   right-after fall  . Proximal humerus fracture 11/29/2012  . Rib fractures 02/05/2017   right side  . 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 Shoulder fracture 12/14/2012  . Stroke ([place])   . Stroke-like  symptoms 12/23/2018  . Type II diabetes mellitus ([place])     Past Surgical History:  Procedure Laterality Date  . AUGMENTATION MAMMAPLASTY Bilateral   . COSMETIC SURGERY    . DILATION AND CURETTAGE OF UTERUS    . EYE SURGERY    . FEMUR IM NAIL Left 07/22/2020   Procedure: INTRAMEDULLARY (IM) NAIL FEMORAL;  Surgeon: Erle Crocker, MD;  Location: [facility];  Service: Orthopedics;  Laterality: Left;  . FRACTURE SURGERY    . JOINT REPLACEMENT    . REVERSE SHOULDER ARTHROPLASTY Right 05/26/2017  . REVERSE SHOULDER ARTHROPLASTY Right 05/26/2017   Procedure: REVERSE RIGHT SHOULDER ARTHROPLASTY;  Surgeon: Meredith Pel, MD;  Location: [facility];  Service: Orthopedics;  Laterality: Right;  . TONSILLECTOMY    . TUBAL LIGATION      Social History   Socioeconomic History  . Marital status: Married    Spouse name: [name]   . Number of children: 2  . Years of education: Not on file  . Highest education level: Not on file  Occupational History  . Occupation: retired  Tobacco Use  . Smoking status: Former Smoker    Packs/day: 1.00    Years: 40.00    Pack years: 40.00    Types: Cigarettes    Quit date: 1991    Years since quitting: 31.2  . Smokeless tobacco: Never Used  Vaping Use  . Vaping Use: Never used  Substance and Sexual Activity  . Alcohol use: No  . Drug  use: No  . Sexual activity: Yes  Other Topics Concern  . Not on file  Social History Narrative   Lives  with husband      Catalina Antigua and [name] -children; lives close by   5 grandchildren, 2 great grandchildren      Enjoys: antiquing, shopping, beach and mountains      Diet: eats all food groups   Caffeine: coffee and coke -drinks for low sugar    Water: 4-5 cups daily       Wears seat belt, is not able to drive well   No Geophysical data processor at home    Social Determinants of Health   Financial Resource Strain: Low Risk   . Difficulty of Paying Living Expenses: Not hard at all  Food Insecurity: No Food  Insecurity  . Worried About Charity fundraiser in the Last Year: Never true  . Ran Out of Food in the Last Year: Never true  Transportation Needs: No Transportation Needs  . Lack of Transportation (Medical): No  . Lack of Transportation (Non-Medical): No  Physical Activity: Inactive  . Days of Exercise per Week: 0 days  . Minutes of Exercise per Session: 0 min  Stress: No Stress Concern Present  . Feeling of Stress : Not at all  Social Connections: Moderately Isolated  . Frequency of Communication with Friends and Family: Three times a week  . Frequency of Social Gatherings with Friends and Family: Once a week  . Attends Religious Services: Never  . Active Member of Clubs or Organizations: No  . Attends Archivist Meetings: Never  . Marital Status: Married  Human resources officer Violence: Not At Risk  . Fear of Current or Ex-Partner: No  . Emotionally Abused: No  . Physically Abused: No  . Sexually Abused: No   Family History  Problem Relation Age of Onset  . Cancer Mother   . Heart disease Father       VITAL SIGNS BP (!) 110/50   Pulse (!) 104   Temp 98.6 F (37 C)   Resp 20   Ht 5\' 5"  (1.651 m)   Wt 148 lb 4.8 oz (67.3 kg)   SpO2 95%   BMI 24.68 kg/m   Outpatient Encounter Medications as of 07/25/2020  Medication Sig  . acetaminophen (TYLENOL) 325 MG tablet Take 2 tablets (650 mg total) by mouth every 6 (six) hours as needed for mild pain (or Fever >/= 101).  Marland Kitchen amLODipine (NORVASC) 5 MG tablet TAKE 1 TABLET BY MOUTH EVERY DAY  . atorvastatin (LIPITOR) 80 MG tablet Take 1 tablet (80 mg total) by mouth daily at 6 PM.  . clopidogrel (PLAVIX) 75 MG tablet Take 1 tablet (75 mg total) by mouth daily.  Marland Kitchen docusate sodium (COLACE) 100 MG capsule Take 1 capsule (100 mg total) by mouth 2 (two) times daily.  . insulin glargine, 2 Unit Dial, (TOUJEO MAX SOLOSTAR) 300 UNIT/ML Solostar Pen Inject 32 Units into the skin daily before breakfast.  . insulin lispro (HUMALOG) 100  UNIT/ML KwikPen Inject 5 Units into the skin 3 (three) times daily with meals.  Marland Kitchen levothyroxine (SYNTHROID) 100 MCG tablet TAKE 1 TABLET BY MOUTH EVERY DAY IN THE MORNING  . Multiple Vitamins-Minerals (OCUVITE ADULT 50+ PO) Take 1 tablet by mouth daily.   . NON FORMULARY Diet NAS, Consistent Carbohydrate  . polyethylene glycol (MIRALAX / GLYCOLAX) 17 g packet Take 17 g by mouth daily as needed for mild constipation.  Marland Kitchen  vitamin B-12 (CYANOCOBALAMIN) 1000 MCG tablet Take 1 tablet (1,000 mcg total) by mouth daily.  Marland Kitchen glucose blood test strip Use as instructed check blood sugar three times daily as needed for machine pt already has (Patient not taking: Reported on 07/25/2020)   No facility-administered encounter medications on file as of 07/25/2020.     SIGNIFICANT DIAGNOSTIC EXAMS  TODAY  07-21-20: left hip x-ray:  Acute angulated, displaced left intratrochanteric hip fracture. Left femoral head is still seated within the left acetabulum. Subacute fractures of the right superior and inferior pubic rami  07-21-20: ct of head and cervical spine:  1. No acute intracranial abnormality in a patient with known prior right middle cerebral artery infarction and 3 cm left frontal parafalcine meningioma. 2. No acute displaced fracture or traumatic listhesis of the cervical spine. 3. Severe atherosclerotic plaque of the carotid arteries within the neck. Recommend carotid ultrasound further evaluation.   LABS REVIEWED TODAY  06-26-20: hgb a1c 7.6; tsh 0.049 vit B 12: 1443 06-27-20: chol 197' ldl 107; trig 95; hdl 71 07-21-20: wbc 11.8; hgb 11.8; hct 37.2;mcv 90.3 plt 193 glucose 88; bun 24; creat 1.03; k+ 3.4; na++ 138; ca 9.5 GFR >60 07-22-20: wbc 11.4; hgb 11.4; hct 35.1; mcv 88.4 plt 264; glucose 333; bun 24; creat 0.92; k+ 4.1; na++ 138; ca 9.3 GFR>60 liver normal albumin 3.4 tsh 0.076 07/17/2020: wbc 10.8; hgb 8.8; hct 27.0; mcv 84.9; plt 206; glucose 330; bun 20; creat 0.87 k+ 3.7; na++ 133; ca 8.7 GFR >60; mag  1.9; phos 1.9    Review of Systems  Constitutional: Negative for malaise/fatigue.  Respiratory: Negative for cough.   Cardiovascular: Negative for chest pain and leg swelling.  Gastrointestinal: Negative for abdominal pain and constipation.  Musculoskeletal: Positive for joint pain. Negative for back pain and myalgias.       Left hip pain   Skin: Negative.   Neurological: Negative for dizziness.  Psychiatric/Behavioral: The patient is not nervous/anxious.     Physical Exam Constitutional:      General: She is not in acute distress.    Appearance: She is well-developed. She is not diaphoretic.  Neck:     Thyroid: No thyromegaly.  Cardiovascular:     Rate and Rhythm: Normal rate and regular rhythm.     Pulses: Normal pulses.     Heart sounds: Normal heart sounds.  Pulmonary:     Effort: Pulmonary effort is normal. No respiratory distress.     Breath sounds: Normal breath sounds.  Abdominal:     General: Bowel sounds are normal. There is no distension.     Palpations: Abdomen is soft.     Tenderness: There is no abdominal tenderness.  Musculoskeletal:     Cervical back: Neck supple.     Right lower leg: No edema.     Left lower leg: No edema.     Comments: Left side hemiplegia: left upper extremity contracture 07-21-20 left hip fracture   Lymphadenopathy:     Cervical: No cervical adenopathy.  Skin:    General: Skin is warm and dry.     Comments: Dressing to incision line dressing intact   Neurological:     Mental Status: She is alert. Mental status is at baseline.  Psychiatric:        Mood and Affect: Mood normal.      ASSESSMENT/ PLAN:  TODAY  1. Closed fracture of left hip sequela: is stable will continue therapy as directed to improve upon her level of  independence with her adls; will follow up with orthopedics as indicated. Does have tylenol every 6 hours as needed for pain.   2. Uncontrolled type 2 diabetes mellitus with diabetic peripheral neuropathy with  current long term use of insulin: her hgb a1c 7.6. her cbg readings remain elevated: will continue toujeo 32 units daily and will increase her humalog to 10 units with meals. Will monitor her status.   3. Hypothyroidism acquired: tsh 0.049 is low will lower her synthroid to 75 mcg daily will repeat tsh in one month.   4. Hyperlipidemia associated with type 2 diabetes mellitus: is stable LDL 107 will continue lipitor 80 mg daily   5. Hypertension associated with type 2 diabetes mellitus: is stable b/p 110/50 will continue norvasc 5 mg daily and will monitor her status  6. Aortic atherosclerosis (ct 02-05-17)/ carotid stenosis bilateral: is stable will continue treatment for hypertension and will continue plavix 75 mg daily   7. History of CVA with residual deficit /  Hemiplegia and hemiparesis following cerebral infarction affecting left non-dominant side: is stable will continue plavix 75 mg daily   8. Chronic constipation: is stable will continue colace twice daily and miralax daily as needed  9. Acute blood loss anemia: is without change hgb 8.8 will monitor   Will check hgb hct  Time spent with patient: 45 minutes: more than 50% spent with coordination of care assessing medication use/ PT/OT     Ok Edwards NP [facility]  Contact [phone] Monday through Friday 8am- 5pm  After hours call [phone]

## 2020-07-26 ENCOUNTER — Encounter: Payer: Self-pay | Admitting: Adult Health

## 2020-07-26 ENCOUNTER — Non-Acute Institutional Stay (SKILLED_NURSING_FACILITY): Payer: Medicare Other | Admitting: Adult Health

## 2020-07-26 DIAGNOSIS — Z794 Long term (current) use of insulin: Secondary | ICD-10-CM | POA: Diagnosis not present

## 2020-07-26 DIAGNOSIS — E1159 Type 2 diabetes mellitus with other circulatory complications: Secondary | ICD-10-CM | POA: Insufficient documentation

## 2020-07-26 DIAGNOSIS — E1165 Type 2 diabetes mellitus with hyperglycemia: Secondary | ICD-10-CM | POA: Diagnosis not present

## 2020-07-26 DIAGNOSIS — IMO0002 Reserved for concepts with insufficient information to code with codable children: Secondary | ICD-10-CM

## 2020-07-26 DIAGNOSIS — E1121 Type 2 diabetes mellitus with diabetic nephropathy: Secondary | ICD-10-CM | POA: Diagnosis not present

## 2020-07-26 DIAGNOSIS — I69354 Hemiplegia and hemiparesis following cerebral infarction affecting left non-dominant side: Secondary | ICD-10-CM | POA: Insufficient documentation

## 2020-07-26 DIAGNOSIS — I700 Atherosclerosis of aorta: Secondary | ICD-10-CM | POA: Insufficient documentation

## 2020-07-26 DIAGNOSIS — K5909 Other constipation: Secondary | ICD-10-CM | POA: Insufficient documentation

## 2020-07-26 DIAGNOSIS — E1169 Type 2 diabetes mellitus with other specified complication: Secondary | ICD-10-CM | POA: Insufficient documentation

## 2020-07-26 DIAGNOSIS — I152 Hypertension secondary to endocrine disorders: Secondary | ICD-10-CM | POA: Insufficient documentation

## 2020-07-26 NOTE — Progress Notes (Signed)
Location:  [place] Room Number: 153/P Place of Service:  SNF (31)   CODE STATUS: DNR  Allergies  Allergen Reactions  . Fosamax [Alendronate Sodium] Other (See Comments)    MUSCLE ACHES  . Prednisone     Feels jittery and nauseous    Chief Complaint  Patient presents with  . Acute Visit    Diabetes     HPI:  Her cbg readings remain elevated in the 300-400's. There are no reports of anxiety or agitation; no reports of excessive thirst or urination. There are no reports of missed medication doses: she is presently taking toujeo 32 units daily; and lispro 10 units three times daily   Past Medical History:  Diagnosis Date  . Arthritis    "some joint pain once in awhile" (05/26/2017)  . Back pain at L4-L5 level 06/14/2013  . Cataract   . CVA (cerebral vascular accident) ([place]) 2019  . Encounter for screening mammogram for malignant neoplasm of breast 04/19/2019  . Generalized weakness 06/09/2018  . Heart murmur   . History of kidney stones   . Hyperlipemia   . Hypertension   . Hypothyroidism (acquired)   . Muscle weakness (generalized) 12/17/2012  . Osteoporosis   . Pain in joint, shoulder region 12/17/2012  . Pneumothorax 02/05/2017   right-after fall  . Proximal humerus fracture 11/29/2012  . Rib fractures 02/05/2017   right side  . 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 Shoulder fracture 12/14/2012  . Stroke ([place])   . Stroke-like symptoms 12/23/2018  . Type II diabetes mellitus ([place])     Past Surgical History:  Procedure Laterality Date  . AUGMENTATION MAMMAPLASTY Bilateral   . COSMETIC SURGERY    . DILATION AND CURETTAGE OF UTERUS    . EYE SURGERY    . FEMUR IM NAIL Left 07/22/2020   Procedure: INTRAMEDULLARY (IM) NAIL FEMORAL;  Surgeon: Erle Crocker, MD;  Location: [facility];  Service: Orthopedics;  Laterality: Left;  . FRACTURE SURGERY    . JOINT REPLACEMENT    . REVERSE SHOULDER ARTHROPLASTY Right 05/26/2017  . REVERSE  SHOULDER ARTHROPLASTY Right 05/26/2017   Procedure: REVERSE RIGHT SHOULDER ARTHROPLASTY;  Surgeon: Meredith Pel, MD;  Location: [place];  Service: Orthopedics;  Laterality: Right;  . TONSILLECTOMY    . TUBAL LIGATION      Social History   Socioeconomic History  . Marital status: Married    Spouse name: [name]   . Number of children: 2  . Years of education: Not on file  . Highest education level: Not on file  Occupational History  . Occupation: retired  Tobacco Use  . Smoking status: Former Smoker    Packs/day: 1.00    Years: 40.00    Pack years: 40.00    Types: Cigarettes    Quit date: 1991    Years since quitting: 31.2  . Smokeless tobacco: Never Used  Vaping Use  . Vaping Use: Never used  Substance and Sexual Activity  . Alcohol use: No  . Drug use: No  . Sexual activity: Yes  Other Topics Concern  . Not on file  Social History Narrative   Lives  with husband      Catalina Antigua and [name] -children; lives close by   5 grandchildren, 2 great grandchildren      Enjoys: antiquing, shopping, beach and mountains      Diet: eats all food groups   Caffeine: coffee and coke -drinks for low sugar  Water: 4-5 cups daily       Wears seat belt, is not able to drive well   No Geophysical data processor at home    Social Determinants of Health   Financial Resource Strain: Low Risk   . Difficulty of Paying Living Expenses: Not hard at all  Food Insecurity: No Food Insecurity  . Worried About Charity fundraiser in the Last Year: Never true  . Ran Out of Food in the Last Year: Never true  Transportation Needs: No Transportation Needs  . Lack of Transportation (Medical): No  . Lack of Transportation (Non-Medical): No  Physical Activity: Inactive  . Days of Exercise per Week: 0 days  . Minutes of Exercise per Session: 0 min  Stress: No Stress Concern Present  . Feeling of Stress : Not at all  Social Connections: Moderately Isolated  . Frequency of Communication with  Friends and Family: Three times a week  . Frequency of Social Gatherings with Friends and Family: Once a week  . Attends Religious Services: Never  . Active Member of Clubs or Organizations: No  . Attends Archivist Meetings: Never  . Marital Status: Married  Human resources officer Violence: Not At Risk  . Fear of Current or Ex-Partner: No  . Emotionally Abused: No  . Physically Abused: No  . Sexually Abused: No   Family History  Problem Relation Age of Onset  . Cancer Mother   . Heart disease Father       VITAL SIGNS BP (!) 120/56   Pulse (!) 102   Temp 97.6 F (36.4 C)   Resp 18   Ht 5\' 5"  (1.651 m)   Wt 148 lb 4.8 oz (67.3 kg)   SpO2 97%   BMI 24.68 kg/m   Outpatient Encounter Medications as of 07/26/2020  Medication Sig  . acetaminophen (TYLENOL) 325 MG tablet Take 2 tablets (650 mg total) by mouth every 6 (six) hours as needed for mild pain (or Fever >/= 101).  Marland Kitchen amLODipine (NORVASC) 5 MG tablet TAKE 1 TABLET BY MOUTH EVERY DAY  . atorvastatin (LIPITOR) 80 MG tablet Take 1 tablet (80 mg total) by mouth daily at 6 PM.  . clopidogrel (PLAVIX) 75 MG tablet Take 1 tablet (75 mg total) by mouth daily.  Marland Kitchen docusate sodium (COLACE) 100 MG capsule Take 1 capsule (100 mg total) by mouth 2 (two) times daily.  . insulin glargine, 2 Unit Dial, (TOUJEO MAX SOLOSTAR) 300 UNIT/ML Solostar Pen Inject 42 Units into the skin daily in the afternoon.  . insulin lispro (HUMALOG) 100 UNIT/ML KwikPen Inject 5 Units into the skin 3 (three) times daily with meals.  Marland Kitchen levothyroxine (SYNTHROID) 75 MCG tablet Take 75 mcg by mouth daily before breakfast.  . Multiple Vitamins-Minerals (OCUVITE ADULT 50+ PO) Take 1 tablet by mouth daily.   . NON FORMULARY Diet NAS, Consistent Carbohydrate  . polyethylene glycol (MIRALAX / GLYCOLAX) 17 g packet Take 17 g by mouth daily as needed for mild constipation.  . vitamin B-12 (CYANOCOBALAMIN) 1000 MCG tablet Take 1 tablet (1,000 mcg total) by mouth daily.   Marland Kitchen glucose blood test strip Use as instructed check blood sugar three times daily as needed for machine pt already has (Patient not taking: No sig reported)  . [DISCONTINUED] insulin glargine, 2 Unit Dial, (TOUJEO MAX SOLOSTAR) 300 UNIT/ML Solostar Pen Inject 32 Units into the skin daily before breakfast.  . [DISCONTINUED] levothyroxine (SYNTHROID) 100 MCG tablet TAKE 1 TABLET BY MOUTH  EVERY DAY IN THE MORNING   No facility-administered encounter medications on file as of 07/26/2020.     SIGNIFICANT DIAGNOSTIC EXAMS  PREVIOUS   07-21-20: left hip x-ray:  Acute angulated, displaced left intratrochanteric hip fracture. Left femoral head is still seated within the left acetabulum. Subacute fractures of the right superior and inferior pubic rami  07-21-20: ct of head and cervical spine:  1. No acute intracranial abnormality in a patient with known prior right middle cerebral artery infarction and 3 cm left frontal parafalcine meningioma. 2. No acute displaced fracture or traumatic listhesis of the cervical spine. 3. Severe atherosclerotic plaque of the carotid arteries within the neck. Recommend carotid ultrasound further evaluation.  NO NEW EXAMS    LABS REVIEWED PREVIOUS   06-26-20: hgb a1c 7.6; tsh 0.049 vit B 12: 1443 06-27-20: chol 197' ldl 107; trig 95; hdl 71 07-21-20: wbc 11.8; hgb 11.8; hct 37.2;mcv 90.3 plt 193 glucose 88; bun 24; creat 1.03; k+ 3.4; na++ 138; ca 9.5 GFR >60 07-22-20: wbc 11.4; hgb 11.4; hct 35.1; mcv 88.4 plt 264; glucose 333; bun 24; creat 0.92; k+ 4.1; na++ 138; ca 9.3 GFR>60 liver normal albumin 3.4 tsh 0.076 07/31/2020: wbc 10.8; hgb 8.8; hct 27.0; mcv 84.9; plt 206; glucose 330; bun 20; creat 0.87 k+ 3.7; na++ 133; ca 8.7 GFR >60; mag 1.9; phos 1.9   NO NEW LABS.    Review of Systems  Constitutional: Negative for malaise/fatigue.  Respiratory: Negative for cough.   Cardiovascular: Negative for chest pain.  Gastrointestinal: Negative for abdominal pain.   Musculoskeletal: Negative for back pain and joint pain.  Skin: Negative.   Neurological: Negative for dizziness.  Psychiatric/Behavioral: The patient is not nervous/anxious.     Physical Exam Constitutional:      General: She is not in acute distress.    Appearance: She is well-developed. She is not diaphoretic.  Neck:     Thyroid: No thyromegaly.  Cardiovascular:     Rate and Rhythm: Normal rate and regular rhythm.     Pulses: Normal pulses.     Heart sounds: Normal heart sounds.  Pulmonary:     Effort: Pulmonary effort is normal. No respiratory distress.     Breath sounds: Normal breath sounds.  Abdominal:     General: Bowel sounds are normal. There is no distension.     Palpations: Abdomen is soft.     Tenderness: There is no abdominal tenderness.  Musculoskeletal:     Cervical back: Neck supple.     Right lower leg: No edema.     Left lower leg: No edema.     Comments:  Left side hemiplegia: left upper extremity contracture 07-21-20 left hip fracture    Lymphadenopathy:     Cervical: No cervical adenopathy.  Skin:    General: Skin is warm and dry.     Comments: Dressing to incision line intact    Neurological:     Mental Status: She is alert. Mental status is at baseline.  Psychiatric:        Mood and Affect: Mood normal.       ASSESSMENT/ PLAN:  TODAY  1. Uncontrolled type 2 diabetes mellitus with diabetic peripheral neuropathy with current long term sue of insulin: hgb a1c 7.6. her readings remain elevated. She has recently had surgery. Will increase to toujeo 42 units daily and lispro 15 units with meals.    Ok Edwards NP [facility]  Contact [phone] Monday through Friday 8am- 5pm  After  hours call [phone]

## 2020-07-27 ENCOUNTER — Encounter (HOSPITAL_COMMUNITY)
Admission: RE | Admit: 2020-07-27 | Discharge: 2020-07-27 | Disposition: A | Discharge status: Home / Self Care | Payer: Medicare Other | Source: Other Acute Inpatient Hospital | Attending: Adult Health | Admitting: Adult Health

## 2020-07-27 ENCOUNTER — Emergency Department (HOSPITAL_COMMUNITY)
Admission: EM | Admit: 2020-07-27 | Discharge: 2020-08-17 | Disposition: E | Payer: Medicare Other | Attending: Emergency Medicine | Admitting: Emergency Medicine

## 2020-07-27 ENCOUNTER — Other Ambulatory Visit: Payer: Self-pay

## 2020-07-27 ENCOUNTER — Encounter (HOSPITAL_COMMUNITY): Payer: Self-pay | Admitting: Emergency Medicine

## 2020-07-27 ENCOUNTER — Emergency Department (HOSPITAL_COMMUNITY): Payer: Medicare Other

## 2020-07-27 DIAGNOSIS — Z96611 Presence of right artificial shoulder joint: Secondary | ICD-10-CM | POA: Diagnosis not present

## 2020-07-27 DIAGNOSIS — E039 Hypothyroidism, unspecified: Secondary | ICD-10-CM | POA: Diagnosis not present

## 2020-07-27 DIAGNOSIS — I10 Essential (primary) hypertension: Secondary | ICD-10-CM | POA: Diagnosis not present

## 2020-07-27 DIAGNOSIS — J90 Pleural effusion, not elsewhere classified: Secondary | ICD-10-CM | POA: Diagnosis not present

## 2020-07-27 DIAGNOSIS — R0603 Acute respiratory distress: Secondary | ICD-10-CM | POA: Diagnosis not present

## 2020-07-27 DIAGNOSIS — R0602 Shortness of breath: Secondary | ICD-10-CM | POA: Diagnosis not present

## 2020-07-27 DIAGNOSIS — E1121 Type 2 diabetes mellitus with diabetic nephropathy: Secondary | ICD-10-CM | POA: Insufficient documentation

## 2020-07-27 DIAGNOSIS — I469 Cardiac arrest, cause unspecified: Secondary | ICD-10-CM | POA: Diagnosis not present

## 2020-07-27 DIAGNOSIS — R000 Tachycardia, unspecified: Secondary | ICD-10-CM | POA: Diagnosis present

## 2020-07-27 DIAGNOSIS — Z794 Long term (current) use of insulin: Secondary | ICD-10-CM | POA: Insufficient documentation

## 2020-07-27 DIAGNOSIS — Z79899 Other long term (current) drug therapy: Secondary | ICD-10-CM | POA: Insufficient documentation

## 2020-07-27 DIAGNOSIS — Z7902 Long term (current) use of antithrombotics/antiplatelets: Secondary | ICD-10-CM | POA: Diagnosis not present

## 2020-07-27 DIAGNOSIS — Z87891 Personal history of nicotine dependence: Secondary | ICD-10-CM | POA: Insufficient documentation

## 2020-07-27 LAB — CBC WITH DIFFERENTIAL/PLATELET
Abs Immature Granulocytes: 0.08 10*3/uL — ABNORMAL HIGH (ref 0.00–0.07)
Basophils Absolute: 0 10*3/uL (ref 0.0–0.1)
Basophils Relative: 0 %
Eosinophils Absolute: 0 10*3/uL (ref 0.0–0.5)
Eosinophils Relative: 0 %
HCT: 28.4 % — ABNORMAL LOW (ref 36.0–46.0)
Hemoglobin: 9 g/dL — ABNORMAL LOW (ref 12.0–15.0)
Immature Granulocytes: 1 %
Lymphocytes Relative: 16 %
Lymphs Abs: 2.3 10*3/uL (ref 0.7–4.0)
MCH: 29 pg (ref 26.0–34.0)
MCHC: 31.7 g/dL (ref 30.0–36.0)
MCV: 91.6 fL (ref 80.0–100.0)
Monocytes Absolute: 1.1 10*3/uL — ABNORMAL HIGH (ref 0.1–1.0)
Monocytes Relative: 8 %
Neutro Abs: 10.4 10*3/uL — ABNORMAL HIGH (ref 1.7–7.7)
Neutrophils Relative %: 75 %
Platelets: 375 10*3/uL (ref 150–400)
RBC: 3.1 MIL/uL — ABNORMAL LOW (ref 3.87–5.11)
RDW: 18 % — ABNORMAL HIGH (ref 11.5–15.5)
WBC: 13.8 10*3/uL — ABNORMAL HIGH (ref 4.0–10.5)
nRBC: 0.2 % (ref 0.0–0.2)

## 2020-07-27 LAB — BASIC METABOLIC PANEL
Anion gap: 19 — ABNORMAL HIGH (ref 5–15)
BUN: 27 mg/dL — ABNORMAL HIGH (ref 8–23)
CO2: 14 mmol/L — ABNORMAL LOW (ref 22–32)
Calcium: 8.6 mg/dL — ABNORMAL LOW (ref 8.9–10.3)
Chloride: 100 mmol/L (ref 98–111)
Creatinine, Ser: 1.12 mg/dL — ABNORMAL HIGH (ref 0.44–1.00)
GFR, Estimated: 48 mL/min — ABNORMAL LOW (ref >60)
Glucose, Bld: 590 mg/dL (ref 70–99)
Potassium: 5.2 mmol/L — ABNORMAL HIGH (ref 3.5–5.1)
Sodium: 133 mmol/L — ABNORMAL LOW (ref 135–145)

## 2020-07-27 LAB — LIPID PANEL
Cholesterol: 161 mg/dL (ref 0–200)
HDL: 47 mg/dL (ref >40)
LDL Cholesterol: 89 mg/dL (ref 0–99)
Total CHOL/HDL Ratio: 3.4 RATIO
Triglycerides: 127 mg/dL (ref <150)
VLDL: 25 mg/dL (ref 0–40)

## 2020-07-27 LAB — BRAIN NATRIURETIC PEPTIDE: B Natriuretic Peptide: 1407 pg/mL — ABNORMAL HIGH (ref 0.0–100.0)

## 2020-07-27 MED ORDER — VANCOMYCIN HCL IN DEXTROSE 1-5 GM/200ML-% IV SOLN: 1000.0000 mg | Freq: Once | INTRAVENOUS | Status: DC

## 2020-07-27 MED ORDER — LACTATED RINGERS IV BOLUS (SEPSIS): 1000.0000 mL | Freq: Once | INTRAVENOUS | Status: DC

## 2020-07-27 MED ORDER — LACTATED RINGERS IV BOLUS (SEPSIS): 250.0000 mL | Freq: Once | INTRAVENOUS | Status: DC

## 2020-07-27 MED ORDER — SODIUM CHLORIDE 0.9 % IV SOLN: 2.0000 g | Freq: Two times a day (BID) | INTRAVENOUS | Status: DC

## 2020-07-27 MED ORDER — SODIUM CHLORIDE 0.9 % IV SOLN: INTRAVENOUS | Status: DC

## 2020-07-27 MED ORDER — LACTATED RINGERS IV SOLN: INTRAVENOUS | Status: DC

## 2020-07-27 MED ORDER — METRONIDAZOLE IN NACL 5-0.79 MG/ML-% IV SOLN: 500.0000 mg | Freq: Once | INTRAVENOUS | Status: DC

## 2020-07-27 MED ORDER — VANCOMYCIN HCL IN DEXTROSE 1-5 GM/200ML-% IV SOLN: 1000.0000 mg | INTRAVENOUS | Status: DC

## 2020-07-27 MED ORDER — SODIUM CHLORIDE 0.9 % IV SOLN: 2.0000 g | Freq: Once | INTRAVENOUS | Status: DC

## 2020-08-01 ENCOUNTER — Encounter (INDEPENDENT_AMBULATORY_CARE_PROVIDER_SITE_OTHER): Payer: Medicare Other | Admitting: Ophthalmology

## 2020-08-17 DIAGNOSIS — 419620001 Death: Secondary | SNOMED CT | POA: Diagnosis not present

## 2020-08-17 NOTE — ED Notes (Signed)
Family at bedside for visitation with patient.

## 2020-08-17 NOTE — ED Notes (Signed)
This RN spoke with Bruce at [facility] home. This RN educated to contact patient placement at [phone] when family is finished with visitation. This RN in agreement at this time.

## 2020-08-17 NOTE — Progress Notes (Signed)
Pharmacy Antibiotic Note  Danielle [name] is a 87 y.o. female admitted on 08-05-20 with sepsis.  Pharmacy has been consulted for cefepime and vancomycin dosing.  Plan: Vancomycin 1000mg  IV every 24 hours.  Goal trough 15-20 mcg/mL. cefepime 2gm iv q12h   Height: 5\' 5"  (165.1 cm) Weight: 67.3 kg (148 lb 5.9 oz) IBW/kg (Calculated) : 57  Temp (24hrs), Avg:98.4 F (36.9 C), Min:97.6 F (36.4 C), Max:99.1 F (37.3 C)  Recent Labs  Lab 07/21/20 2139 07/22/20 0309 07/23/20 0921 08/02/2020 0503 08/05/2020 0635  WBC 11.8* 11.4* 9.9 10.8* 13.8*  CREATININE 1.03* 0.92 1.05* 0.87 1.12*    Estimated Creatinine Clearance: 32.4 mL/min (A) (by C-G formula based on SCr of 1.12 mg/dL (H)).    Allergies  Allergen Reactions  . Fosamax [Alendronate Sodium] Other (See Comments)    MUSCLE ACHES  . Prednisone     Feels jittery and nauseous    Antimicrobials this admission: 3/11 metronidazole x 1  3/11 vancomycin >>  3/11 cefepime >>   Microbiology results: 3/11 BCx: sent  3/11 UCx: sent   3/11 Sputum: sent    Thank you for allowing pharmacy to be a part of this patient's care.  [name] Tomer Chalmers 08-05-20 8:46 AM

## 2020-08-17 NOTE — ED Notes (Signed)
[place], Primary Nurse notified of patient expiration at this time.

## 2020-08-17 NOTE — ED Notes (Signed)
Pt placed in body bag, identified with toe tag.  Pt transported to [facility] with assistance from staff at this time.

## 2020-08-17 NOTE — ED Provider Notes (Addendum)
[facility] EMERGENCY DEPARTMENT Provider Note   CSN: [id number] Arrival date & time: 2020-08-25  4142     History Chief Complaint  Patient presents with  . Altered Mental Status    Danielle [name] is a 87 y.o. female.  Patient brought over from [facility].  Patient status post left hip fracture surgery.  Arrived at the [facility] for rehab on March 8.  Fairly during physical therapy this morning patient began to have significant breathing problems and became unresponsive.  Family does state that patient was a little confused and having breathing problems yesterday.  Upon arrival here EMS noted that oxygen saturations were in the 70s.  We had her on nasal cannula.  At 1 point we had better oxygen sats but quickly after arrival she started to [place].  She was tachycardic initial blood pressure systolic was around 395.  And then she dropped down into the 70s.  This initiated some concern may be for sepsis.  Sepsis protocol was ordered respiratory was ordered to start BiPAP.  Was noted that patient is a DNR.  EMS stated the blood sugar was in the 500 range.  Patient's husband stated blood sugars been running in the 400s for several days.        Past Medical History:  Diagnosis Date  . Arthritis    "some joint pain once in awhile" (05/26/2017)  . Back pain at L4-L5 level 06/14/2013  . Cataract   . CVA (cerebral vascular accident) ([place]) 2019  . Encounter for screening mammogram for malignant neoplasm of breast 04/19/2019  . Generalized weakness 06/09/2018  . Heart murmur   . History of kidney stones   . Hyperlipemia   . Hypertension   . Hypothyroidism (acquired)   . Muscle weakness (generalized) 12/17/2012  . Osteoporosis   . Pain in joint, shoulder region 12/17/2012  . Pneumothorax 02/05/2017   right-after fall  . Proximal humerus fracture 11/29/2012  . Rib fractures 02/05/2017   right side  . 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 Shoulder fracture 12/14/2012  .  Stroke ([place])   . Stroke-like symptoms 12/23/2018  . Type II diabetes mellitus [facility])     Patient Active Problem List   Diagnosis Date Noted  . Hyperlipidemia associated with type 2 diabetes mellitus ([place]) 07/26/2020  . Hypertension associated with type 2 diabetes mellitus ([place]) 07/26/2020  . Aortic atherosclerosis ([place]) 07/26/2020  . Chronic constipation 07/26/2020  . Hemiplegia and hemiparesis following cerebral infarction affecting left non-dominant side ([place]) 07/26/2020  . Closed left hip fracture ([place]) 07/22/2020  . Acute lower UTI 06/27/2020  . Acute metabolic encephalopathy 32/06/3341  . Metabolic encephalopathy 56/86/1683  . Acute ischemic stroke ([place]) 06/26/2020  . Carotid artery occlusion with infarction ([place]) 04/10/2020  . Fracture of left wrist with routine healing 03/15/2020  . Closed fracture of left pubis with routine healing 03/15/2020  . Leg swelling 02/15/2020  . Hospital discharge follow-up 02/15/2020  . Other fracture of upper end of left radius, closed fracture--transverse fracture in the distal radial metaphysis 12/31/2019  . Closed fracture of multiple pubic rami, left, initial encounter ([place]) 12/29/2019  . Fall at home, initial encounter 12/29/2019  . Breast pain, left 12/22/2019  . History of CVA with residual deficit 11/10/2019  . Osteoporosis 04/19/2019  . Hyperlipidemia 04/19/2019  . Carotid stenosis, bilateral 12/30/2018  . Hypothyroidism (acquired) 05/03/2018  . Hypertension 05/03/2018  . Uncontrolled type 2 diabetes mellitus with diabetic nephropathy, with long-term current use of insulin (  [facility])     Past Surgical History:  Procedure Laterality Date  . AUGMENTATION MAMMAPLASTY Bilateral   . COSMETIC SURGERY    . DILATION AND CURETTAGE OF UTERUS    . EYE SURGERY    . FEMUR IM NAIL Left 07/22/2020   Procedure: INTRAMEDULLARY (IM) NAIL FEMORAL;  Surgeon: Erle Crocker, MD;  Location: [facility];  Service: Orthopedics;  Laterality: Left;  . FRACTURE  SURGERY    . JOINT REPLACEMENT    . REVERSE SHOULDER ARTHROPLASTY Right 05/26/2017  . REVERSE SHOULDER ARTHROPLASTY Right 05/26/2017   Procedure: REVERSE RIGHT SHOULDER ARTHROPLASTY;  Surgeon: Meredith Pel, MD;  Location: [place];  Service: Orthopedics;  Laterality: Right;  . TONSILLECTOMY    . TUBAL LIGATION       OB History    Gravida      Para      Term      Preterm      AB      Living  2     SAB      IAB      Ectopic      Multiple      Live Births              Family History  Problem Relation Age of Onset  . Cancer Mother   . Heart disease Father     Social History   Tobacco Use  . Smoking status: Former Smoker    Packs/day: 1.00    Years: 40.00    Pack years: 40.00    Types: Cigarettes    Quit date: 1991    Years since quitting: 31.2  . Smokeless tobacco: Never Used  Vaping Use  . Vaping Use: Never used  Substance Use Topics  . Alcohol use: No  . Drug use: No    Home Medications Prior to Admission medications   Medication Sig Start Date End Date Taking? Authorizing Provider  acetaminophen (TYLENOL) 325 MG tablet Take 2 tablets (650 mg total) by mouth every 6 (six) hours as needed for mild pain (or Fever >/= 101). 01/20/19  Yes Angiulli, Lavon Paganini, PA-C  amLODipine (NORVASC) 5 MG tablet TAKE 1 TABLET BY MOUTH EVERY DAY Patient taking differently: Take 5 mg by mouth daily. 06/11/20  Yes Perlie Mayo, NP  atorvastatin (LIPITOR) 80 MG tablet Take 1 tablet (80 mg total) by mouth daily at 6 PM. 05/08/20  Yes Perlie Mayo, NP  clopidogrel (PLAVIX) 75 MG tablet Take 1 tablet (75 mg total) by mouth daily. 07/10/20  Yes Lindell Spar, MD  docusate sodium (COLACE) 100 MG capsule Take 1 capsule (100 mg total) by mouth 2 (two) times daily. 08/05/2020  Yes Dahal, Marlowe Aschoff, MD  insulin glargine, 2 Unit Dial, (TOUJEO MAX SOLOSTAR) 300 UNIT/ML Solostar Pen Inject 42 Units into the skin daily in the afternoon.   Yes [provider]  insulin lispro  (HUMALOG) 100 UNIT/ML KwikPen Inject 15 Units into the skin 3 (three) times daily with meals. Prime with 2u before each injection, keep needle into skin at least 6 seconds after injection   Yes [provider]  Multiple Vitamins-Minerals (OCUVITE ADULT 50+ PO) Take 1 tablet by mouth daily.    Yes [provider]  polyethylene glycol (MIRALAX / GLYCOLAX) 17 g packet Take 17 g by mouth daily as needed for mild constipation. 07/30/2020  Yes Dahal, Marlowe Aschoff, MD  vitamin B-12 (CYANOCOBALAMIN) 1000 MCG tablet Take 1 tablet (1,000 mcg total) by mouth daily.  01/20/19  Yes Angiulli, Lavon Paganini, PA-C  glucose blood test strip Use as instructed check blood sugar three times daily as needed for machine pt already has Patient not taking: No sig reported 04/25/20   Perlie Mayo, NP  levothyroxine (SYNTHROID) 75 MCG tablet Take 75 mcg by mouth daily before breakfast.    [provider]  NON FORMULARY Diet NAS, Consistent Carbohydrate    [provider]    Allergies    Fosamax [alendronate sodium] and Prednisone  Review of Systems   Review of Systems  Unable to perform ROS: Patient unresponsive    Physical Exam Updated Vital Signs BP (!) 88/55 (BP Location: Left Arm)   Pulse (!) 122   Temp 99.1 F (37.3 C) (Axillary)   Resp (!) 33   Ht 1.651 m (5\' 5" )   Wt 67.3 kg   SpO2 93%   BMI 24.69 kg/m   Physical Exam Constitutional:      General: She is in acute distress.     Comments: Patient unresponsive.  Eyes:     Pupils: Pupils are equal, round, and reactive to light.  Cardiovascular:     Rate and Rhythm: Tachycardia present.  Pulmonary:     Effort: Respiratory distress present.     Breath sounds: Rales present.  Abdominal:     Palpations: Abdomen is soft.  Musculoskeletal:     Comments: Surgical dressings to the left thigh area.  No evidence of any infection.  Patient with femoral pulse.  Neurological:     Comments: Patient not moving any of her extremities.      ED Results / Procedures / Treatments   Labs (all labs ordered are listed, but only abnormal results are displayed) Labs Reviewed  RESP PANEL BY RT-PCR (FLU A&B, COVID) ARPGX2  URINE CULTURE  BLOOD GAS, ARTERIAL  CBC WITH DIFFERENTIAL/PLATELET  URINALYSIS, ROUTINE W REFLEX MICROSCOPIC    EKG EKG Interpretation  Date/Time:  08-17-2020 08:19:30 EST Ventricular Rate:  107 PR Interval:    QRS Duration: 154 QT Interval:  359 QTC Calculation: 479 R Axis:   -47 Text Interpretation: Sinus tachycardia Left bundle branch block Confirmed by Fredia Sorrow [phone]) on 2020/08/17 8:30:33 AM   Radiology DG Chest Port 1 View  Result Date: 2020/08/17 CLINICAL DATA:  Shortness of breath, altered mental status EXAM: PORTABLE CHEST 1 VIEW COMPARISON:  07/21/2020 FINDINGS: Small bilateral pleural effusions. Diffuse bilateral mild interstitial thickening. Bibasilar airspace disease likely reflecting atelectasis. No pneumothorax. Heart and mediastinal contours are unremarkable. No acute osseous abnormality. IMPRESSION: 1. Findings concerning for pulmonary edema. Electronically Signed   By: Kathreen Devoid   On: 2020/08/17 08:55    Procedures Procedures   CRITICAL CARE Performed by: Fredia Sorrow Total critical care time: 35 minutes Critical care time was exclusive of separately billable procedures and treating other patients. Critical care was necessary to treat or prevent imminent or life-threatening deterioration. Critical care was time spent personally by me on the following activities: development of treatment plan with patient and/or surrogate as well as nursing, discussions with consultants, evaluation of patient's response to treatment, examination of patient, obtaining history from patient or surrogate, ordering and performing treatments and interventions, ordering and review of laboratory studies, ordering and review of radiographic studies, pulse oximetry and re-evaluation  of patient's condition.   Medications Ordered in ED Medications  0.9 %  sodium chloride infusion (has no administration in time range)  lactated ringers infusion (has no administration in time range)  ceFEPIme (MAXIPIME) 2 g in sodium chloride 0.9 % 100 mL IVPB (has no administration in time range)  metroNIDAZOLE (FLAGYL) IVPB 500 mg (has no administration in time range)  vancomycin (VANCOCIN) IVPB 1000 mg/200 mL premix (has no administration in time range)  lactated ringers bolus 1,000 mL (has no administration in time range)    And  lactated ringers bolus 1,000 mL (has no administration in time range)    And  lactated ringers bolus 250 mL (has no administration in time range)  ceFEPIme (MAXIPIME) 2 g in sodium chloride 0.9 % 100 mL IVPB (has no administration in time range)  vancomycin (VANCOCIN) IVPB 1000 mg/200 mL premix (has no administration in time range)    ED Course  I have reviewed the triage vital signs and the nursing notes.  Pertinent labs & imaging results that were available during my care of the patient were reviewed by me and considered in my medical decision making (see chart for details).    MDM Rules/Calculators/A&P                          Patient DNR.  Patient brought from [facility].  Lynndyl stated they were doing some physical therapy on her legs and suddenly she had significant breathing problems.  EMS stated that her oxygen sats were in the 70s on room air.  Blood pressures were in the 80K systolic.  Blood sugar was in the 500 range.  Husband in consultation stated that she did not seem to be completely coherent yesterday but was talking.  Also he thought she had some breathing problems yesterday and said that her blood sugars have been running in the 400 range for several days.  Shortly after arrival.  Patient certainly had increased respiratory rate.  Was hypoxic even with nasal cannula.  Respiratory therapy was called to put her on BiPAP.  Patient  blood pressures were initially 100 but then quickly got down into the 70s.  Based on the sepsis protocol was triggered.  Chest x-ray done but not formally read.  There was some question of aspiration pneumonia but formal reading seems to show more pulmonary edema.  It was clear that patient was getting much worse.  Respiratory was present they were trying to set up the BiPAP.  When patient went into respiratory and cardiac arrest.  We had not gotten a chance to initiate the sepsis protocol yet.  Spoke with patient's husband.  As stated above where we got information.  Patient was pronounced as expired at 0 900.  Patient just here for short period time prior to cardiac arrest.  Patient for rehab will was processed and at [facility] on March 8.  There is a note from the nurse practitioner H&P on March 9.  Dr. Huey Bienenstock is the physician at [facility].  Feel that the nurse practitioner Dr. Huey Bienenstock should do death certificate.  Patient not a candidate for medical examiner.   Final Clinical Impression(s) / ED Diagnoses Final diagnoses:  Respiratory distress  Cardiac arrest [facility])    Rx / DC Orders ED Discharge Orders    None       Fredia Sorrow, MD Aug 01, 2020 1106    Fredia Sorrow, MD Aug 01, 2020 [phone]

## 2020-08-17 NOTE — ED Notes (Addendum)
Entered room and introduced self to patient. Pt appears to be resting in bed, respirations are even and unlabored with equal chest rise and fall. Bed is locked in the lowest position, side rails x2, call bell within reach. Cardiac monitor in place with vital signs cycling q30 minutes. Will continue to monitor.

## 2020-08-17 NOTE — ED Notes (Signed)
This RN, and respiratory therapy at bedside for care.  This RN notes that patient began agonal breathing and change in cardiac rhythm to on continuous monitor at this time. Dr. Rogene Houston called to bedside.  Pt remains agonal breathing, heart rate rapidly declined to 40-35.  Staff remains at bedside. This RN notes no palpable femoral or carotid pulse.

## 2020-08-17 NOTE — ED Notes (Addendum)
Dr. Rogene Houston at bedside. Pt pronounced time of death at 0900. Per Dr. Rogene Houston, pt will not be evaluated by medical examiner.

## 2020-08-17 NOTE — ED Notes (Signed)
With assistance from staff, patient rolled and changed into clean sheets and placed into a clean gown.  Patient's belongings that contain 1 white t-shirt and 1 blue wrist splint placed into a patient belonging bag and labeled with patient identification sticker.

## 2020-08-17 NOTE — ED Notes (Addendum)
Dr. Rogene Houston to bedside due to patient's vital signs and worsening clinical presentation.  Respiratory therapy notified of ABG order and request for evaluation on Bi-Pap.   Pt remains lethargic and is no longer able to follow commands.  Per Dr. Rogene Houston, initiate sepsis protocol at this time.

## 2020-08-17 NOTE — ED Notes (Signed)
[facility] notified. This RN spoke with Tonye Becket, case number [id number].

## 2020-08-17 NOTE — ED Triage Notes (Signed)
Pt arrived by [facility] from the [place] [facility] for sudden ams around 0730 this morning when doing therapy. Staff states began having slurred speech, and oxygen in the 80s. Pt does have history of previous stroke but the slurred speech is new today staff states.

## 2020-08-17 DEATH — deceased

## 2020-10-04 IMAGING — MR MRI CERVICAL SPINE WITHOUT AND WITH CONTRAST
4 of 8 series · 20 of 48 positions shown · IV contrast (gadavist)
Comparison: Cervical spine CT scan from 02/05/2017

CLINICAL DATA: Left upper extremity weakness.

EXAM:
MRI CERVICAL SPINE WITHOUT AND WITH CONTRAST
TECHNIQUE: Multiplanar and multiecho pulse sequences of the cervical spine, to
include the craniocervical junction and cervicothoracic junction,
were obtained without and with intravenous contrast.
CONTRAST:  6 cc Gadavist

[Series 6: T2 · axial · 3.0mm · 0.22mm/px · z∈[-180,-76]mm · 8 of 33 slices shown (1 of 2)]
[im 1/33]
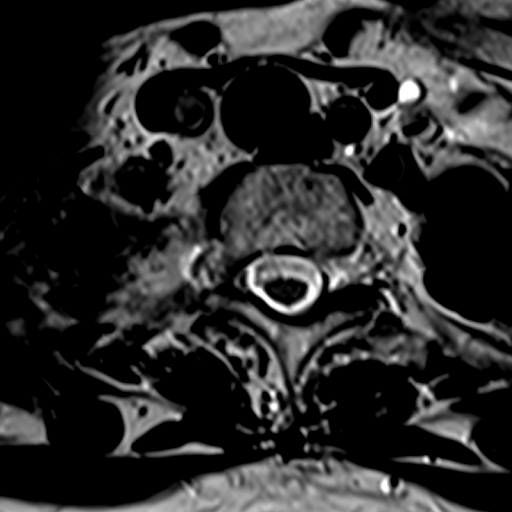
[im 5/33]
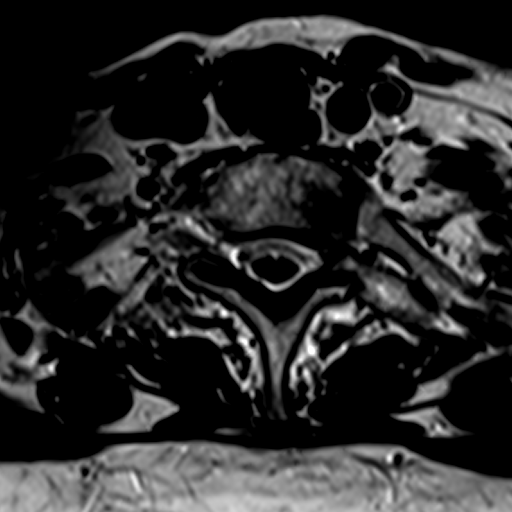
[im 10/33]
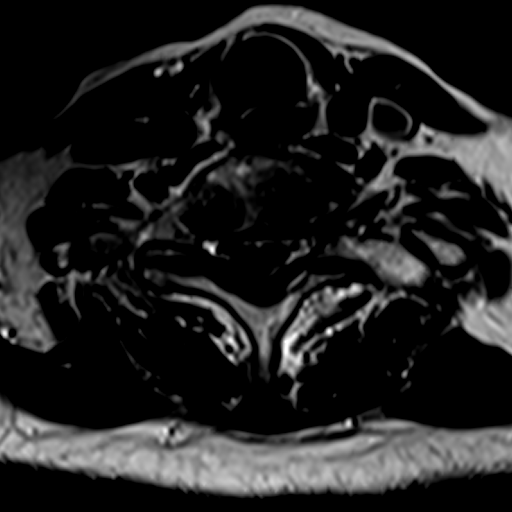
[im 14/33]
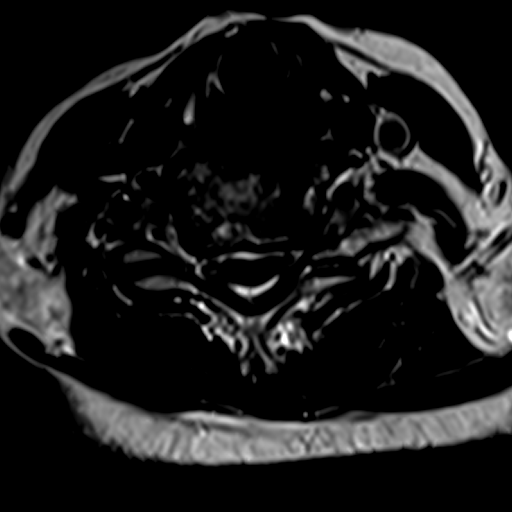
[im 19/33]
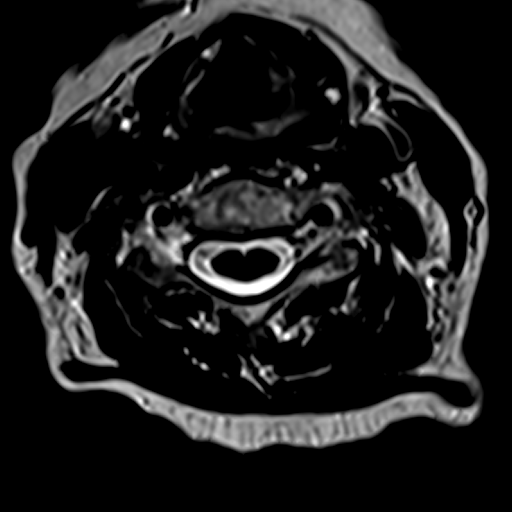
[im 23/33]
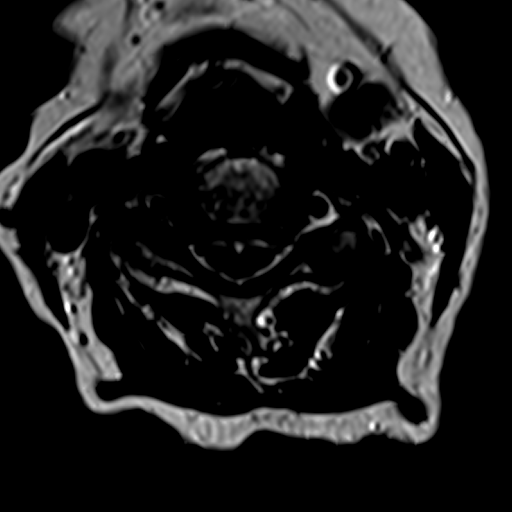
[im 28/33]
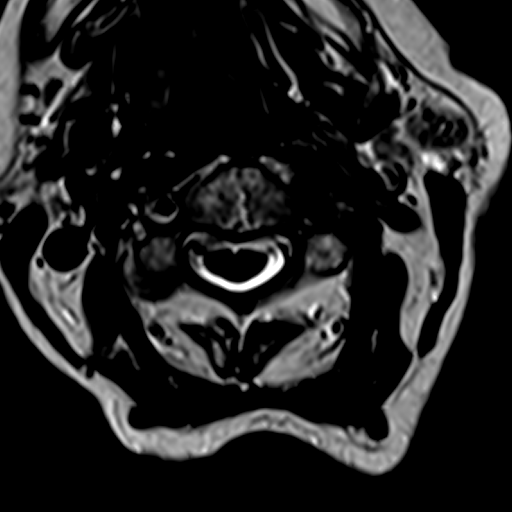
[im 33/33]
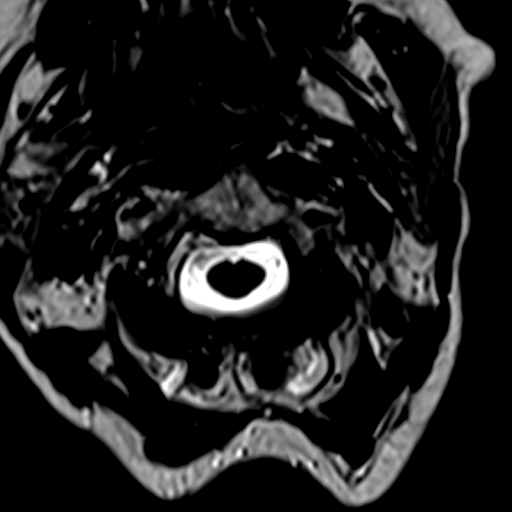

[Series 8: T1 · axial · 3.0mm · 0.17mm/px · z∈[-180,-92]mm · 5 of 33 slices shown]
[im 1/33]
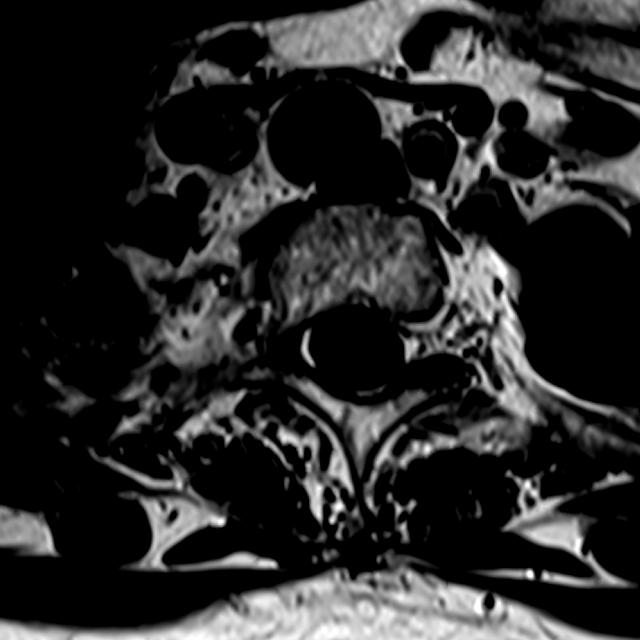
[im 5/33]
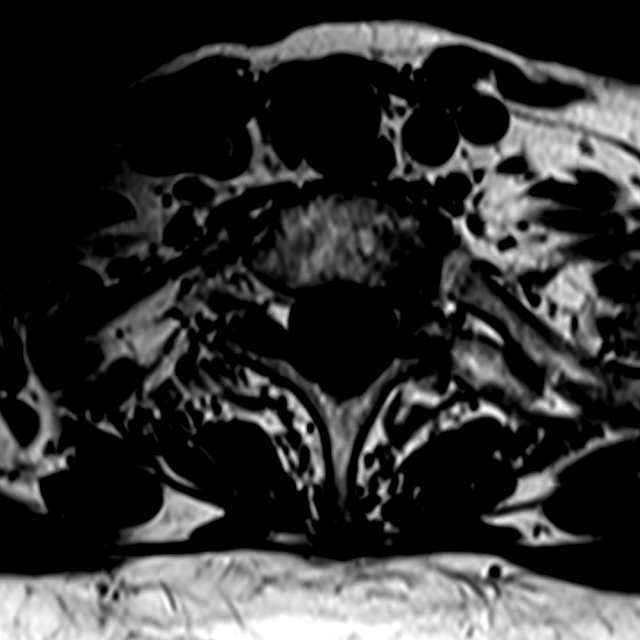
[im 10/33]
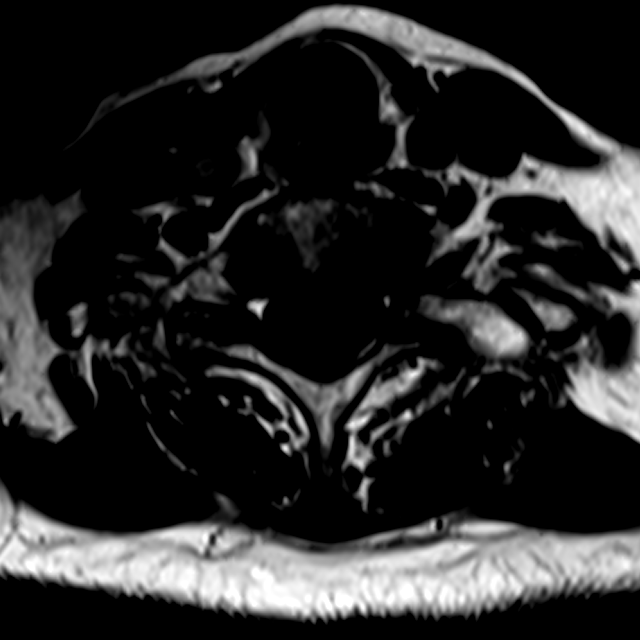
[im 19/33]
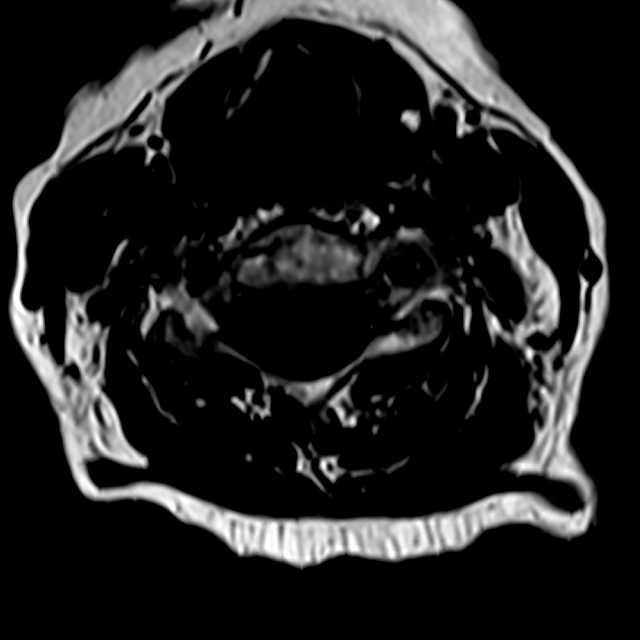
[im 28/33]
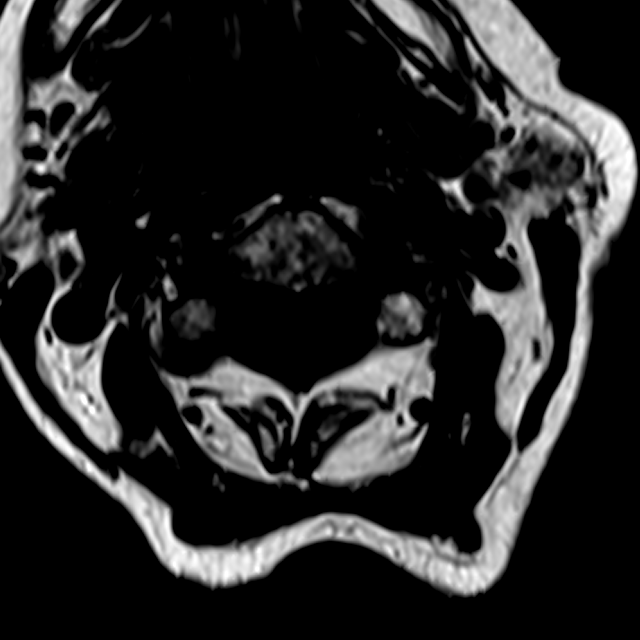

[Series 9: T2 · sagittal · 3.0mm · 0.51mm/px · 4 of 15 slices shown (2 of 2)]
[im 1/15]
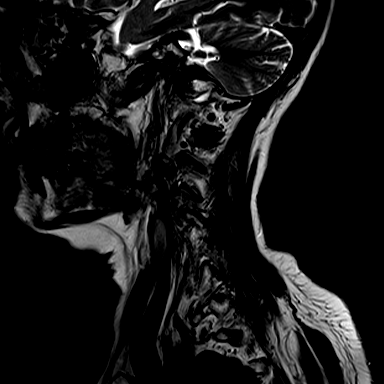
[im 5/15]
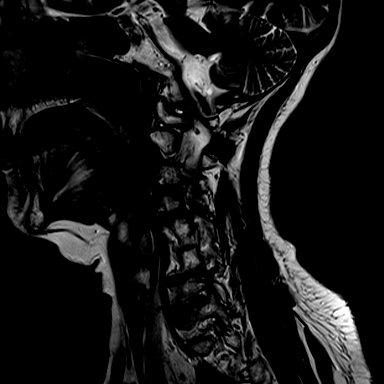
[im 10/15]
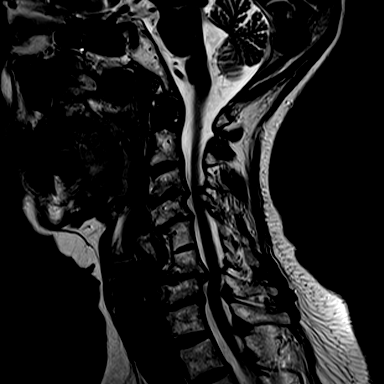
[im 15/15]
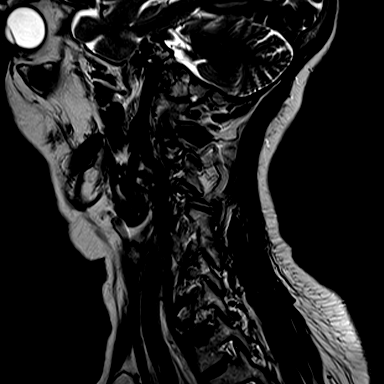

[Series 10: T1 fat-sat post-contrast · sagittal · 3.0mm · 0.61mm/px · 3 of 15 slices shown]
[im 1/15]
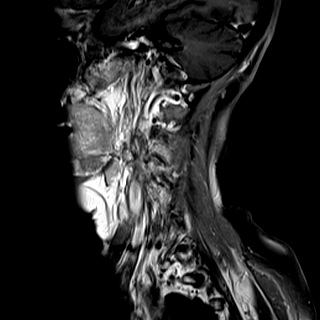
[im 10/15]
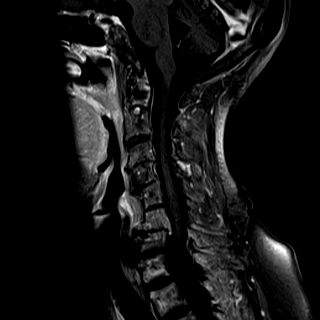
[im 15/15]
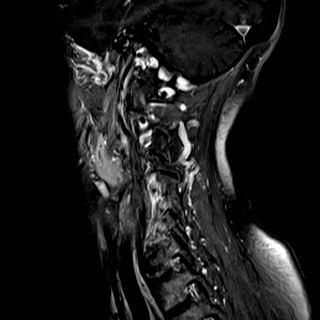

[20 of 48 positions shown; findings below may reference images not displayed]

FINDINGS: Alignment: Severe degenerative cervical spondylosis with multilevel
disc disease and facet disease. Multilevel degenerative subluxations
are noted. Toshiko anomaly noted at C5-6.

Vertebrae: No bone lesions or fractures.

Cord: Normal MR appearance of the cervical spinal cord. No cord
lesions or syrinx. No areas of abnormal cord enhancement.

Posterior Fossa, vertebral arteries, paraspinal tissues: No
significant findings.

Disc levels:

C2-3: No significant findings.

C3-4: Degenerative disc disease and facet disease with a bulging
annulus and osteophytic ridging. No significant spinal stenosis.
Left-sided disc osteophyte complex with mild left foraminal
stenosis.

C4-5: Advanced degenerative disc disease with a bulging degenerated
annulus, osteophytic ridging and uncinate spurring. There is
flattening of the ventral thecal sac and narrowing the ventral CSF
space. No significant foraminal stenosis.

C5-6: Toshiko anomaly with partial fusion. No spinal or
foraminal stenosis.

C6-7: Advanced degenerative disc disease with a bulging degenerated
annulus, osteophytic ridging and uncinate spurring. There is
flattening of the ventral thecal sac and near obliteration of the
ventral CSF space. Mild foraminal narrowing bilaterally due to
uncinate spurring.

C7-T1: Bulging annulus and facet disease but no disc protrusions or
spinal stenosis. Mild bilateral foraminal stenosis.
IMPRESSION: 1. Advanced degenerative cervical spondylosis with multilevel disc
disease and facet disease.
2. Toshiko anomaly at C5-6.
3. Normal MR appearance of the cervical spinal cord.
4. Mild spinal and bilateral foraminal stenosis at C6-7.
5. Mild left foraminal stenosis at C3-4.
6. Mild bilateral foraminal stenosis at C7-T1.

## 2020-10-04 IMAGING — CT CT ANGIOGRAPHY NECK
1 of 10 series · 6 of 33 positions shown · IV contrast (Isovue)
Comparison: Brain MRI 9627 hours today and earlier. Neck MRA
05/03/2018.
COMPARISON: Brain MRI 9627 hours today and earlier. Neck MRA
05/03/2018.

Addendum:
CLINICAL DATA: 86-year-old female with acute, subacute, and chronic
right MCA territory infarcts. Chronic occlusion of the right ICA at
the skull base.

EXAM:
CT ANGIOGRAPHY HEAD AND NECK
TECHNIQUE: Multidetector CT imaging of the head and neck was performed using
the standard protocol during bolus administration of intravenous
contrast. Multiplanar CT image reconstructions and MIPs were
obtained to evaluate the vascular anatomy. Carotid stenosis
measurements (when applicable) are obtained utilizing NASCET
criteria, using the distal internal carotid diameter as the
denominator.
CONTRAST:  100mL OMNIPAQUE IOHEXOL 350 MG/ML SOLN

[Series 11: ax thins · axial · 0.39mm/px · z∈[+1455,+1699]mm · 6 of 343 slices shown]
[im 49/343  soft-tissue]
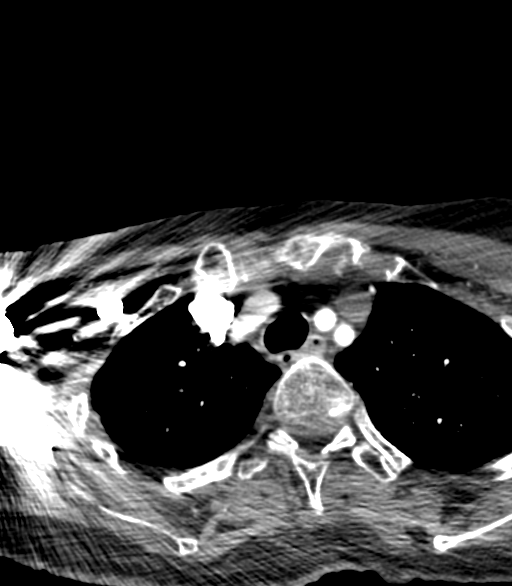
[im 98/343  bone]
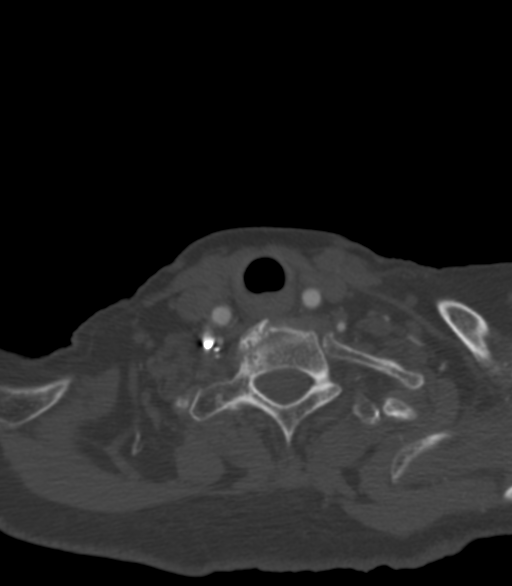
[im 147/343  soft-tissue]
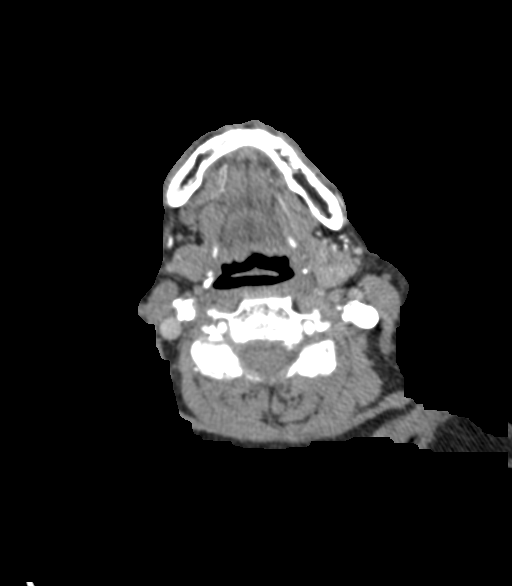
[im 196/343  bone]
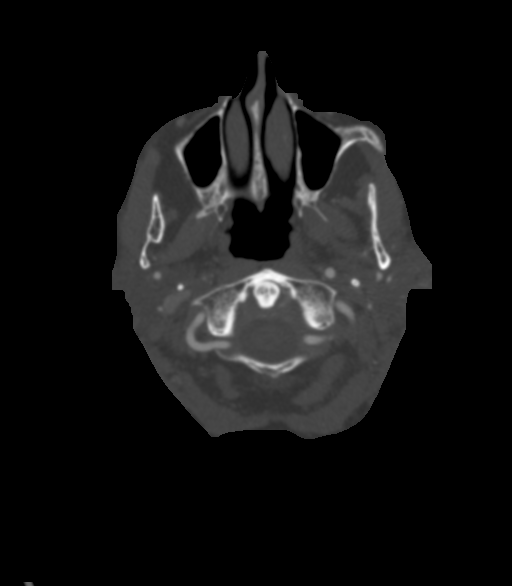
[im 245/343  soft-tissue]
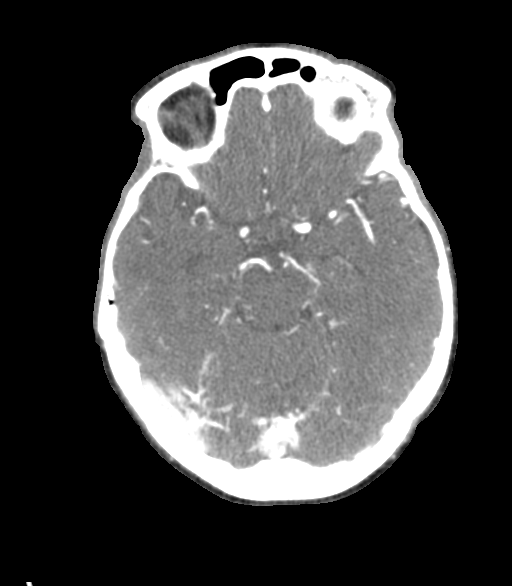
[im 294/343  bone]
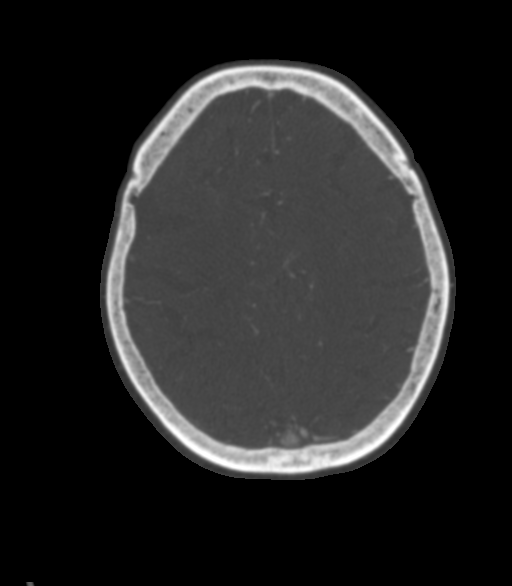

[6 of 33 positions shown; findings below may reference images not displayed]

FINDINGS: CT HEAD

Brain: Patchy and confluent hypodensity in the right MCA territory
corresponding to the various ischemia. No associated hemorrhage. No
associated mass effect. No ventriculomegaly.

Stable gray-white matter differentiation elsewhere since 12/23/2018.
Left anterior paraspinal seen meningioma measuring about
centimeters diameter redemonstrated on series 5, image 9.

Calvarium and skull base: No acute osseous abnormality identified.

Paranasal sinuses: Visualized paranasal sinuses and mastoids are
stable and well pneumatized.

Orbits: No acute orbit or scalp soft tissue findings.

CTA NECK

Skeleton: Severe cervical spine degeneration superimposed on
congenital appearing incomplete segmentation of C5-C6. Partially
visible thoracic scoliosis and right shoulder arthroplasty. No acute
osseous abnormality identified.

Upper chest: Negative.

Other neck: Negative.

Aortic arch: Mild to moderate arch atherosclerosis. Three vessel
arch configuration.

Right carotid system: No brachiocephalic or right CCA origin
stenosis. Mild soft and calcified plaque in the right CCA proximal
to the bifurcation. There is confluent calcification of the right
ICA origin resulting in near occlusion of the vessel, string sign
type reconstitution occurs at the bulb (series 13, image 65), and
the vessel is patent but highly diminutive and thread-like to the
skull base. See series 9, image 65.

Left carotid system: No left CCA origin stenosis and minimal plaque
proximal to the left carotid bifurcation. The left ICA origin is
also heavily calcified with string sign stenosis. The distal bulb is
diminutive, only 2-3 millimeters diameter. However, the remaining
cervical left ICA has a more normal caliber and is patent to the
skull base without additional stenosis.

Vertebral arteries:
No proximal right subclavian artery stenosis despite plaque. The
right vertebral artery is non dominant. Calcified plaque near the
right vertebral origin results in no significant stenosis. The right
vertebral is patent to the skull base without stenosis.

No proximal left subclavian artery stenosis despite soft and
calcified plaque. Dominant left vertebral artery with a normal
origin. The left vertebral is patent to the skull base without
stenosis.

CTA HEAD

Posterior circulation: Mildly dominant left V4 segment. No distal
vertebral stenosis. Normal right PICA origin. The left AICA appears
dominant and patent. Patent vertebrobasilar junction and basilar
artery without stenosis. Patent AICA and SCA origins. Patent right
PCA origin with mild irregularity but no significant stenosis. There
is a fetal type left PCA. The right posterior communicating artery
is diminutive or absent. Left PCA branches are within normal limits.
There is additional moderate stenosis in the right PCA P2 segment,
but the distal right PCA is within normal limits.

Anterior circulation: Thread-like enhancement of the right ICA at
the skull base is noted but the right siphon is occluded in the
distal petrous segment. Enhancement is reconstituted at the distal
cavernous segment and the supraclinoid right ICA is patent although
with asymmetrically decreased enhancement. The right ICA terminus is
patent. The right A1 and M1 are patent but diminutive (series 15,
image 16). The right MCA bifurcation is patent. The right MCA
branches are patent but diminutive (series 14, image 19).

The left ICA siphon is patent without stenosis despite some
calcified plaque. Normal left posterior communicating artery origin.
Normal left ICA terminus, left ACA origin. An anterior communicating
artery is identified, although the left ACA remains larger than the
right throughout. The ACA branches are within normal limits except
for some mass effect related to the para falcine meningioma.

The left MCA origin is normal with mild irregularity and stenosis in
the left M1. The left MCA bifurcation is patent without stenosis.
Left MCA branches are within normal limits.

Venous sinuses: Patent.

Anatomic variants: Dominant left vertebral artery.

Review of the MIP images confirms the above findings
IMPRESSION: 1. Positive for BILATERAL Radiographic-String-Sign stenoses of BOTH
ICA origins due to bulky calcification.
- the Right ICA is thread-like distal to the severe stenosis, and
occludes in the mid right siphon.
- there is suboptimal reconstitution of the distal right ICA (see
#2) and the right MCA and right ACA are patent but diminutive.
- the left ICA maintains a more normal caliber distal to the severe
stenosis. And the left anterior circulation is largely normal.
2. There is no vertebral or basilar artery stenosis, but the Right
Pcomm is diminutive or absent. There is up to moderate right PCA
stenosis.
3. Expected CT appearance of right MCA ischemia. No intracranial
hemorrhage or mass effect.
4. Chronic left para falcine meningioma.
5. Advanced cervical spine degeneration superimposed on congenital
incomplete segmentation of C5-C6.

ADDENDUM:
Study discussed by telephone with Dr. Sule Ozuna on 12/29/2018 at 4999
hours.

*** End of Addendum ***
FINDINGS: CT HEAD

Brain: Patchy and confluent hypodensity in the right MCA territory
corresponding to the various ischemia. No associated hemorrhage. No
associated mass effect. No ventriculomegaly.

Stable gray-white matter differentiation elsewhere since 12/23/2018.
Left anterior paraspinal seen meningioma measuring about
centimeters diameter redemonstrated on series 5, image 9.

Calvarium and skull base: No acute osseous abnormality identified.

Paranasal sinuses: Visualized paranasal sinuses and mastoids are
stable and well pneumatized.

Orbits: No acute orbit or scalp soft tissue findings.

CTA NECK

Skeleton: Severe cervical spine degeneration superimposed on
congenital appearing incomplete segmentation of C5-C6. Partially
visible thoracic scoliosis and right shoulder arthroplasty. No acute
osseous abnormality identified.

Upper chest: Negative.

Other neck: Negative.

Aortic arch: Mild to moderate arch atherosclerosis. Three vessel
arch configuration.

Right carotid system: No brachiocephalic or right CCA origin
stenosis. Mild soft and calcified plaque in the right CCA proximal
to the bifurcation. There is confluent calcification of the right
ICA origin resulting in near occlusion of the vessel, string sign
type reconstitution occurs at the bulb (series 13, image 65), and
the vessel is patent but highly diminutive and thread-like to the
skull base. See series 9, image 65.

Left carotid system: No left CCA origin stenosis and minimal plaque
proximal to the left carotid bifurcation. The left ICA origin is
also heavily calcified with string sign stenosis. The distal bulb is
diminutive, only 2-3 millimeters diameter. However, the remaining
cervical left ICA has a more normal caliber and is patent to the
skull base without additional stenosis.

Vertebral arteries:
No proximal right subclavian artery stenosis despite plaque. The
right vertebral artery is non dominant. Calcified plaque near the
right vertebral origin results in no significant stenosis. The right
vertebral is patent to the skull base without stenosis.

No proximal left subclavian artery stenosis despite soft and
calcified plaque. Dominant left vertebral artery with a normal
origin. The left vertebral is patent to the skull base without
stenosis.

CTA HEAD

Posterior circulation: Mildly dominant left V4 segment. No distal
vertebral stenosis. Normal right PICA origin. The left AICA appears
dominant and patent. Patent vertebrobasilar junction and basilar
artery without stenosis. Patent AICA and SCA origins. Patent right
PCA origin with mild irregularity but no significant stenosis. There
is a fetal type left PCA. The right posterior communicating artery
is diminutive or absent. Left PCA branches are within normal limits.
There is additional moderate stenosis in the right PCA P2 segment,
but the distal right PCA is within normal limits.

Anterior circulation: Thread-like enhancement of the right ICA at
the skull base is noted but the right siphon is occluded in the
distal petrous segment. Enhancement is reconstituted at the distal
cavernous segment and the supraclinoid right ICA is patent although
with asymmetrically decreased enhancement. The right ICA terminus is
patent. The right A1 and M1 are patent but diminutive (series 15,
image 16). The right MCA bifurcation is patent. The right MCA
branches are patent but diminutive (series 14, image 19).

The left ICA siphon is patent without stenosis despite some
calcified plaque. Normal left posterior communicating artery origin.
Normal left ICA terminus, left ACA origin. An anterior communicating
artery is identified, although the left ACA remains larger than the
right throughout. The ACA branches are within normal limits except
for some mass effect related to the para falcine meningioma.

The left MCA origin is normal with mild irregularity and stenosis in
the left M1. The left MCA bifurcation is patent without stenosis.
Left MCA branches are within normal limits.

Venous sinuses: Patent.

Anatomic variants: Dominant left vertebral artery.

Review of the MIP images confirms the above findings
IMPRESSION: 1. Positive for BILATERAL Radiographic-String-Sign stenoses of BOTH
ICA origins due to bulky calcification.
- the Right ICA is thread-like distal to the severe stenosis, and
occludes in the mid right siphon.
- there is suboptimal reconstitution of the distal right ICA (see
#2) and the right MCA and right ACA are patent but diminutive.
- the left ICA maintains a more normal caliber distal to the severe
stenosis. And the left anterior circulation is largely normal.
2. There is no vertebral or basilar artery stenosis, but the Right
Pcomm is diminutive or absent. There is up to moderate right PCA
stenosis.
3. Expected CT appearance of right MCA ischemia. No intracranial
hemorrhage or mass effect.
4. Chronic left para falcine meningioma.
5. Advanced cervical spine degeneration superimposed on congenital
incomplete segmentation of C5-C6.

## 2020-10-04 IMAGING — MR MRI HEAD WITHOUT CONTRAST
8 of 10 series · 34 of 48 positions shown · non-contrast
Comparison: 12/23/2018

CLINICAL DATA: Leg weakness with difficulty walking. Recent stroke.

EXAM:
MRI HEAD WITHOUT CONTRAST
TECHNIQUE: Multiplanar, multiecho pulse sequences of the brain and surrounding
structures were obtained without intravenous contrast.

[Series 2: T1 · sagittal · 5.0mm · 0.39mm/px · 2 of 20 slices shown (1 of 2)]
[im 1/20]
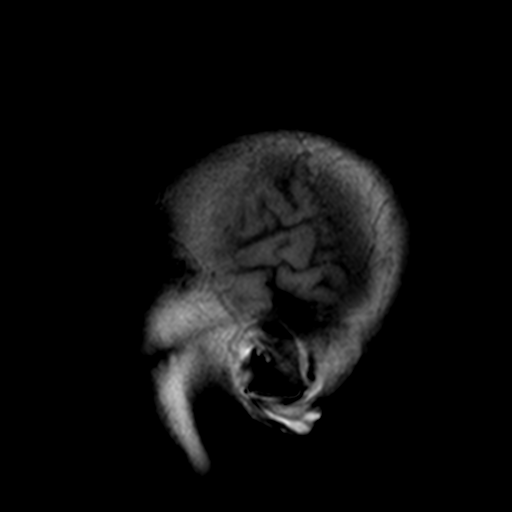
[im 20/20]
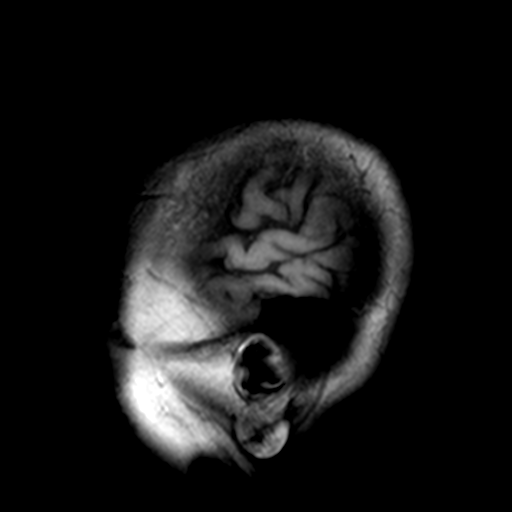

[Series 3: DWI · axial · 3.0mm · 0.71mm/px · z∈[-104,+58]mm · 7 of 55 slices shown (1 of 2)]
[im 1/55]
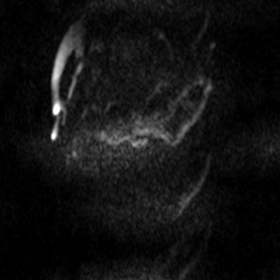
[im 10/55]
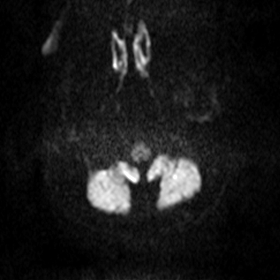
[im 19/55]
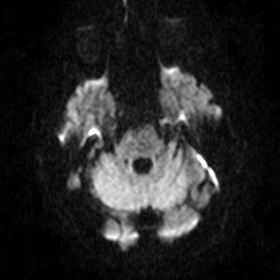
[im 28/55]
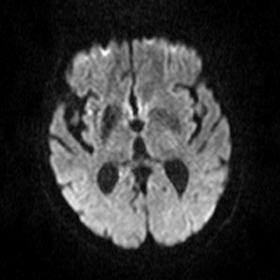
[im 37/55]
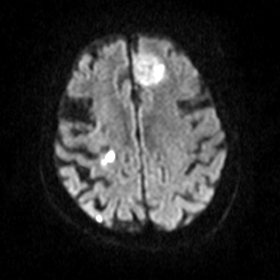
[im 46/55]
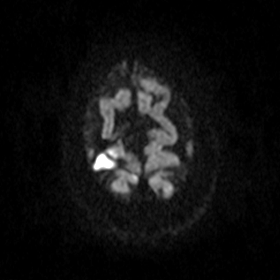
[im 55/55]
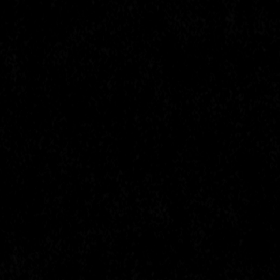

[Series 5: DWI · coronal · 5.0mm · 0.48mm/px · 4 of 34 slices shown (2 of 2)]
[im 1/34]
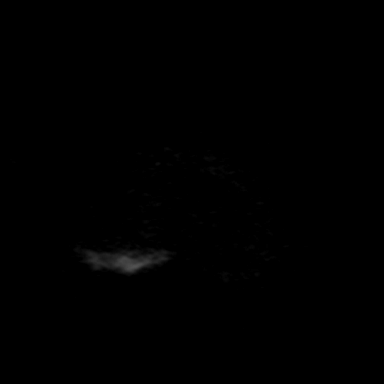
[im 12/34]
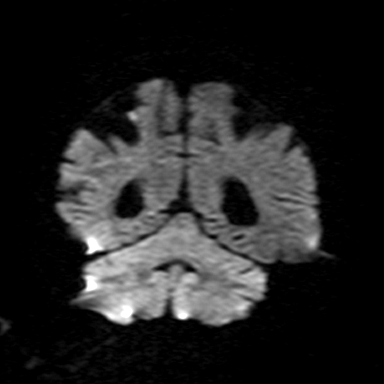
[im 23/34]
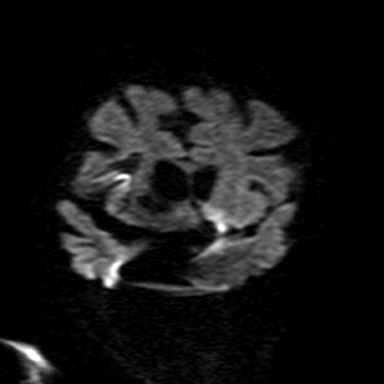
[im 34/34]
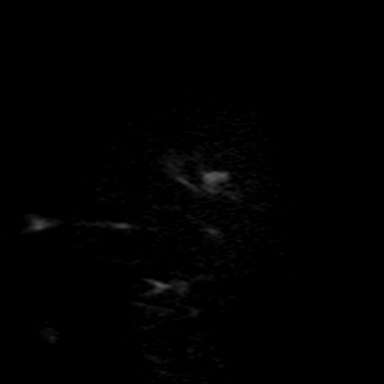

[Series 7: T2 · axial · 5.0mm · 0.64mm/px · z∈[-94,+49]mm · 3 of 23 slices shown (1 of 3)]
[im 1/23]
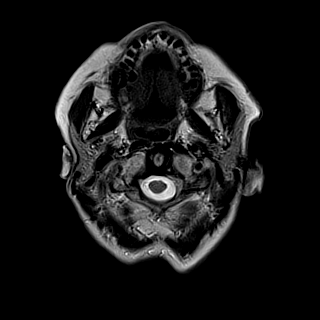
[im 12/23]
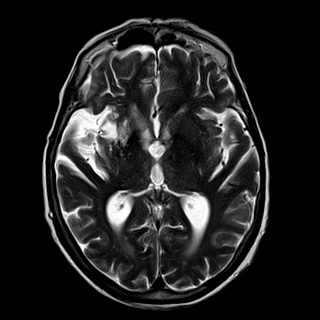
[im 23/23]
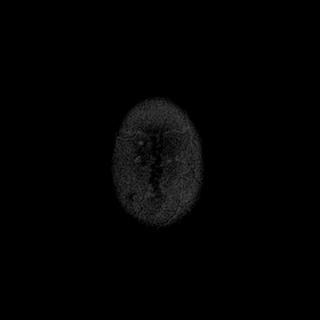

[Series 8: T2 · axial · 5.0mm · 0.40mm/px · z∈[-87,+42]mm · 3 of 21 slices shown (2 of 3)]
[im 1/21]
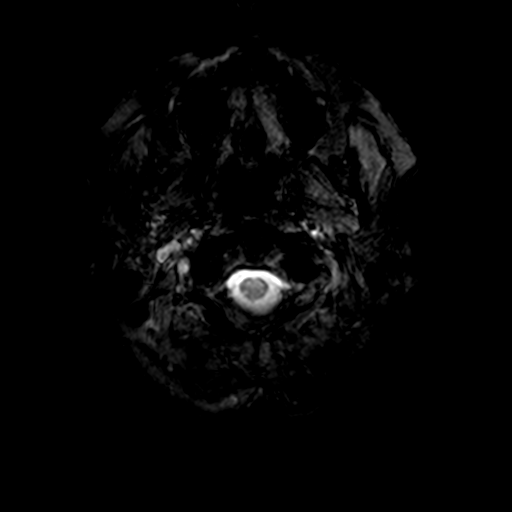
[im 11/21]
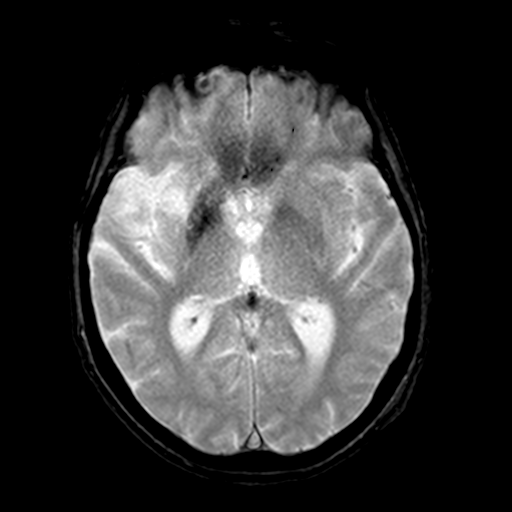
[im 21/21]
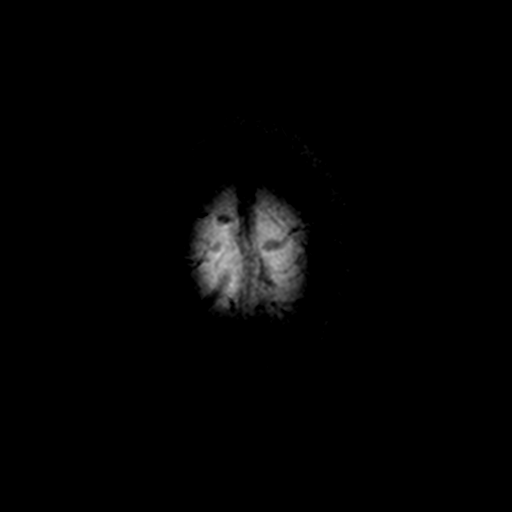

[Series 9: FLAIR · axial · 3.0mm · 0.79mm/px · z∈[-90,+48]mm · 6 of 47 slices shown]
[im 1/47]
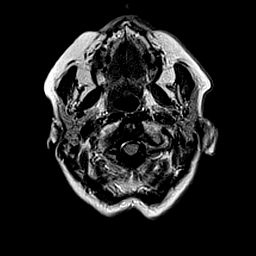
[im 10/47]
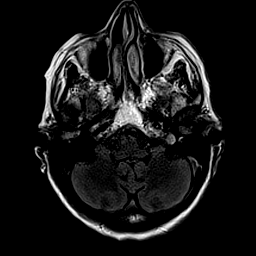
[im 19/47]
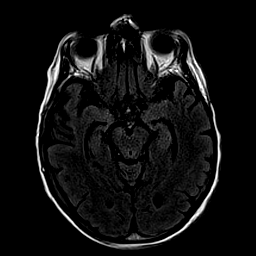
[im 28/47]
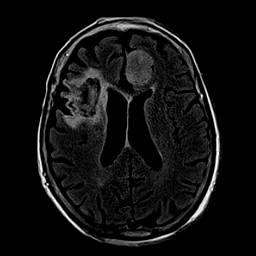
[im 37/47]
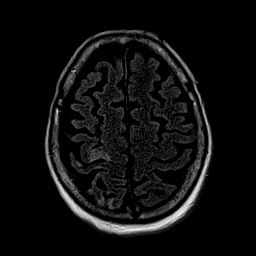
[im 47/47]
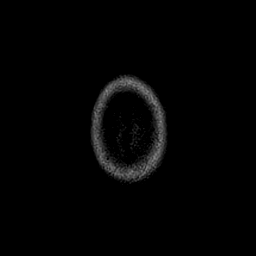

[Series 10: T1 · axial · 2.0mm · 0.40mm/px · z∈[-85,+21]mm · 6 of 72 slices shown (2 of 2)]
[im 1/72]
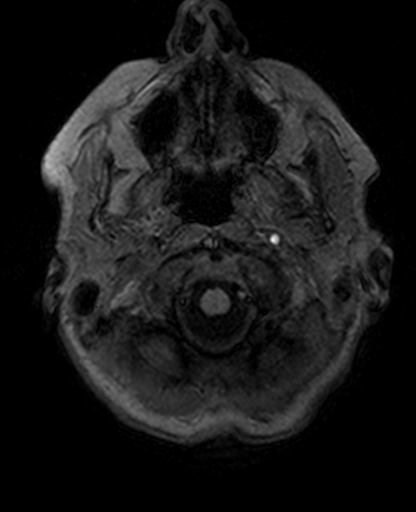
[im 9/72]
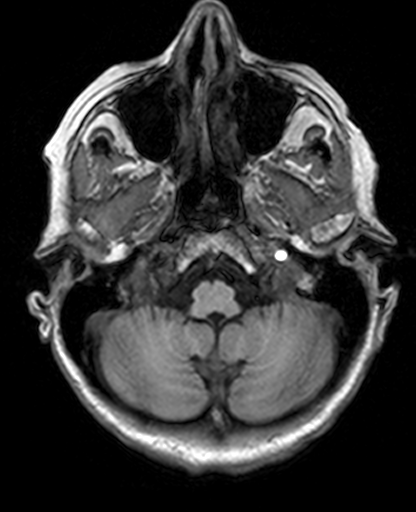
[im 18/72]
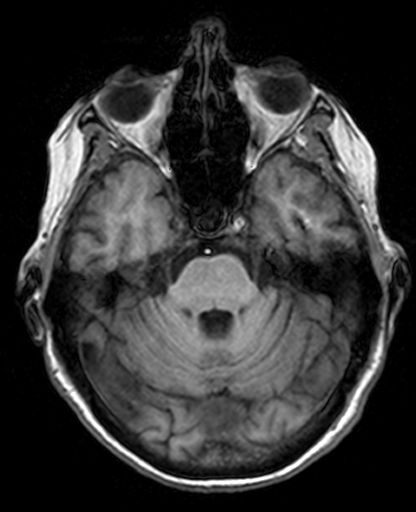
[im 27/72]
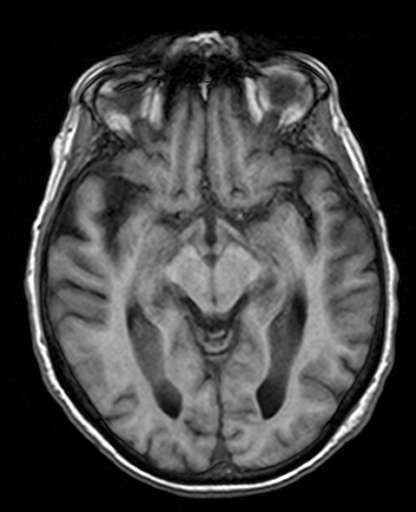
[im 45/72]
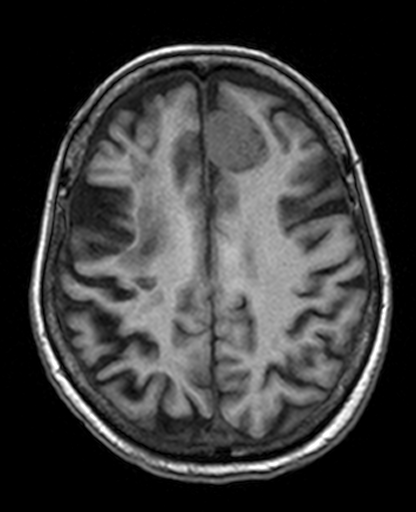
[im 54/72]
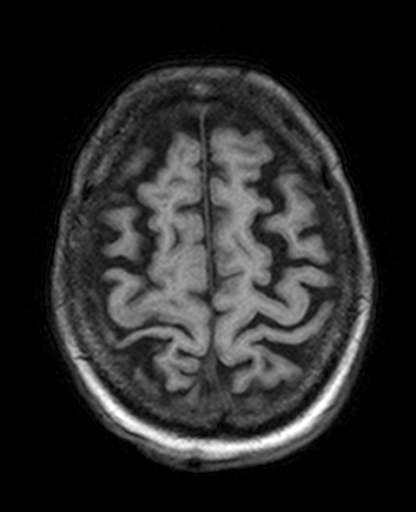

[Series 11: T2 · coronal · 5.0mm · 0.54mm/px · 3 of 28 slices shown (3 of 3)]
[im 1/28]
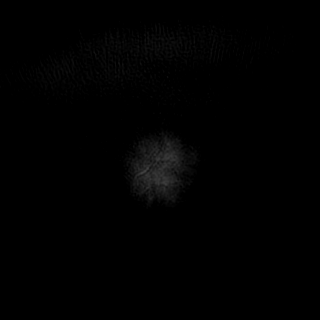
[im 14/28]
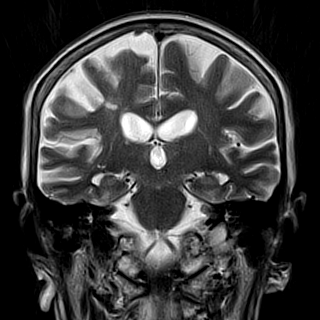
[im 28/28]
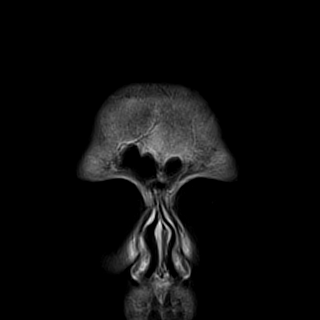

[34 of 48 positions shown; findings below may reference images not displayed]

FINDINGS: Brain: Many of the small acute right MCA infarcts on the recent
prior MRI demonstrate diminished diffusion signal abnormality.
However, there are multiple new acute right MCA infarcts involving
frontal and parietal cortex which are overall larger than those on
the prior study including a 3 cm infarct in the right precentral
gyrus in the hand motor region which extends inferiorly into the
centrum semiovale. There is no associated hemorrhage.

A chronic right MCA infarct is again noted involving the insula,
basal ganglia, and frontal lobe with hemosiderin staining and ex
vacuo dilatation of the right frontal horn. An extra-axial left
frontal mass anteriorly along the falx is unchanged, measuring 2.1 x
2.8 x 3.6 cm with local mass effect but no significant edema. There
is no midline shift or extra-axial fluid collection. Mild
generalized cerebral atrophy is not greater than expected for age.

Vascular: Chronic right ICA occlusion.

Skull and upper cervical spine: Unremarkable bone marrow signal.

Sinuses/Orbits: Bilateral cataract extraction. Paranasal sinuses and
mastoid air cells are clear.

Other: None.
IMPRESSION: 1. Multiple new acute right MCA infarcts involving the frontal and
parietal lobes.
2. Subacute and chronic infarcts as above.
3. Unchanged left para falcine meningioma.
4. Chronic occlusion of the right ICA.

## 2020-10-08 ENCOUNTER — Ambulatory Visit: Payer: Medicare Other | Admitting: Internal Medicine
